# Patient Record
Sex: Female | Born: 1940 | Race: White | Hispanic: No | Smoking: Former smoker
Health system: Southern US, Community
[De-identification: ages and names within clinical notes are randomized; demographics above are authoritative.]

## PROBLEM LIST (undated history)

## (undated) DIAGNOSIS — F0390 Unspecified dementia without behavioral disturbance: Secondary | ICD-10-CM

## (undated) DIAGNOSIS — K5790 Diverticulosis of intestine, part unspecified, without perforation or abscess without bleeding: Secondary | ICD-10-CM

## (undated) DIAGNOSIS — G479 Sleep disorder, unspecified: Secondary | ICD-10-CM

## (undated) DIAGNOSIS — I5030 Unspecified diastolic (congestive) heart failure: Secondary | ICD-10-CM

## (undated) DIAGNOSIS — H349 Unspecified retinal vascular occlusion: Secondary | ICD-10-CM

## (undated) DIAGNOSIS — F329 Major depressive disorder, single episode, unspecified: Secondary | ICD-10-CM

## (undated) DIAGNOSIS — Z87442 Personal history of urinary calculi: Secondary | ICD-10-CM

## (undated) DIAGNOSIS — Q211 Atrial septal defect: Secondary | ICD-10-CM

## (undated) DIAGNOSIS — I700 Atherosclerosis of aorta: Secondary | ICD-10-CM

## (undated) DIAGNOSIS — R296 Repeated falls: Secondary | ICD-10-CM

## (undated) DIAGNOSIS — K8010 Calculus of gallbladder with chronic cholecystitis without obstruction: Secondary | ICD-10-CM

## (undated) DIAGNOSIS — M47816 Spondylosis without myelopathy or radiculopathy, lumbar region: Secondary | ICD-10-CM

## (undated) DIAGNOSIS — R0609 Other forms of dyspnea: Secondary | ICD-10-CM

## (undated) DIAGNOSIS — E785 Hyperlipidemia, unspecified: Secondary | ICD-10-CM

## (undated) DIAGNOSIS — I509 Heart failure, unspecified: Secondary | ICD-10-CM

## (undated) DIAGNOSIS — I10 Essential (primary) hypertension: Secondary | ICD-10-CM

## (undated) DIAGNOSIS — N183 Chronic kidney disease, stage 3 unspecified: Secondary | ICD-10-CM

## (undated) DIAGNOSIS — K219 Gastro-esophageal reflux disease without esophagitis: Secondary | ICD-10-CM

## (undated) DIAGNOSIS — F1010 Alcohol abuse, uncomplicated: Secondary | ICD-10-CM

## (undated) DIAGNOSIS — D649 Anemia, unspecified: Secondary | ICD-10-CM

## (undated) DIAGNOSIS — F1024 Alcohol dependence with alcohol-induced mood disorder: Secondary | ICD-10-CM

## (undated) DIAGNOSIS — K449 Diaphragmatic hernia without obstruction or gangrene: Secondary | ICD-10-CM

## (undated) DIAGNOSIS — M1990 Unspecified osteoarthritis, unspecified site: Secondary | ICD-10-CM

## (undated) DIAGNOSIS — R531 Weakness: Secondary | ICD-10-CM

## (undated) DIAGNOSIS — I639 Cerebral infarction, unspecified: Secondary | ICD-10-CM

## (undated) DIAGNOSIS — K760 Fatty (change of) liver, not elsewhere classified: Secondary | ICD-10-CM

## (undated) DIAGNOSIS — R413 Other amnesia: Secondary | ICD-10-CM

## (undated) DIAGNOSIS — J449 Chronic obstructive pulmonary disease, unspecified: Secondary | ICD-10-CM

## (undated) DIAGNOSIS — J189 Pneumonia, unspecified organism: Secondary | ICD-10-CM

## (undated) DIAGNOSIS — Q2112 Patent foramen ovale: Secondary | ICD-10-CM

## (undated) HISTORY — DX: Repeated falls: R29.6

## (undated) HISTORY — DX: Weakness: R53.1

## (undated) HISTORY — DX: Patent foramen ovale: Q21.12

## (undated) HISTORY — DX: Hyperlipidemia, unspecified: E78.5

## (undated) HISTORY — PX: APPENDECTOMY: SHX54

## (undated) HISTORY — DX: Unspecified retinal vascular occlusion: H34.9

## (undated) HISTORY — PX: CHOLECYSTECTOMY: SHX55

## (undated) HISTORY — DX: Atrial septal defect: Q21.1

## (undated) HISTORY — PX: FRACTURE SURGERY: SHX138

## (undated) HISTORY — DX: Heart failure, unspecified: I50.9

## (undated) HISTORY — PX: TUBAL LIGATION: SHX77

## (undated) HISTORY — DX: Essential (primary) hypertension: I10

## (undated) HISTORY — DX: Gastro-esophageal reflux disease without esophagitis: K21.9

## (undated) HISTORY — PX: ANKLE ARTHROSCOPY WITH OPEN REDUCTION INTERNAL FIXATION (ORIF): SHX5582

## (undated) HISTORY — DX: Other amnesia: R41.3

## (undated) SURGERY — PULMONARY THROMBECTOMY
Anesthesia: Moderate Sedation | Laterality: Right

---

## 2007-05-08 ENCOUNTER — Ambulatory Visit: Payer: Self-pay | Admitting: Vascular Surgery

## 2007-05-24 ENCOUNTER — Encounter (INDEPENDENT_AMBULATORY_CARE_PROVIDER_SITE_OTHER): Payer: Self-pay | Admitting: *Deleted

## 2007-05-24 ENCOUNTER — Ambulatory Visit (HOSPITAL_COMMUNITY): Admission: RE | Admit: 2007-05-24 | Discharge: 2007-05-24 | Payer: Self-pay | Admitting: *Deleted

## 2007-11-05 LAB — TSH: TSH: 1.89 u[IU]/mL (ref <=5.90)

## 2009-04-15 ENCOUNTER — Ambulatory Visit: Payer: Self-pay | Admitting: Vascular Surgery

## 2009-08-24 LAB — BASIC METABOLIC PANEL: BUN: 26 mg/dL — AB (ref 4–21)

## 2010-04-08 ENCOUNTER — Emergency Department: Payer: Self-pay | Admitting: Emergency Medicine

## 2010-04-16 ENCOUNTER — Ambulatory Visit: Payer: Self-pay | Admitting: Cardiovascular Disease

## 2010-09-18 ENCOUNTER — Encounter: Payer: Self-pay | Admitting: Cardiovascular Disease

## 2010-09-18 DIAGNOSIS — E785 Hyperlipidemia, unspecified: Secondary | ICD-10-CM | POA: Insufficient documentation

## 2010-09-18 DIAGNOSIS — H349 Unspecified retinal vascular occlusion: Secondary | ICD-10-CM | POA: Insufficient documentation

## 2010-09-18 DIAGNOSIS — Q2112 Patent foramen ovale: Secondary | ICD-10-CM | POA: Insufficient documentation

## 2010-09-18 DIAGNOSIS — Q211 Atrial septal defect: Secondary | ICD-10-CM | POA: Insufficient documentation

## 2010-09-18 DIAGNOSIS — I10 Essential (primary) hypertension: Secondary | ICD-10-CM | POA: Insufficient documentation

## 2010-09-18 DIAGNOSIS — K219 Gastro-esophageal reflux disease without esophagitis: Secondary | ICD-10-CM | POA: Insufficient documentation

## 2010-11-09 ENCOUNTER — Ambulatory Visit: Payer: Self-pay | Admitting: Cardiovascular Disease

## 2010-11-25 ENCOUNTER — Ambulatory Visit: Payer: Self-pay | Admitting: Cardiovascular Disease

## 2010-12-13 ENCOUNTER — Other Ambulatory Visit: Payer: Self-pay | Admitting: *Deleted

## 2010-12-13 DIAGNOSIS — E7800 Pure hypercholesterolemia, unspecified: Secondary | ICD-10-CM

## 2010-12-28 ENCOUNTER — Ambulatory Visit (INDEPENDENT_AMBULATORY_CARE_PROVIDER_SITE_OTHER): Payer: Medicare Other | Admitting: Cardiovascular Disease

## 2010-12-28 ENCOUNTER — Encounter: Payer: Self-pay | Admitting: Cardiovascular Disease

## 2010-12-28 ENCOUNTER — Other Ambulatory Visit (INDEPENDENT_AMBULATORY_CARE_PROVIDER_SITE_OTHER): Payer: Medicare Other | Admitting: *Deleted

## 2010-12-28 VITALS — BP 158/102 | HR 62 | Ht 67.0 in | Wt 216.6 lb

## 2010-12-28 DIAGNOSIS — I10 Essential (primary) hypertension: Secondary | ICD-10-CM

## 2010-12-28 DIAGNOSIS — R9431 Abnormal electrocardiogram [ECG] [EKG]: Secondary | ICD-10-CM

## 2010-12-28 DIAGNOSIS — E7800 Pure hypercholesterolemia, unspecified: Secondary | ICD-10-CM

## 2010-12-28 LAB — LIPID PANEL
Cholesterol: 172 mg/dL (ref 0–200)
HDL: 55 mg/dL (ref >39.00)
LDL Cholesterol: 93 mg/dL (ref 0–99)
Total CHOL/HDL Ratio: 3
Triglycerides: 121 mg/dL (ref 0.0–149.0)
VLDL: 24.2 mg/dL (ref 0.0–40.0)

## 2010-12-28 LAB — BASIC METABOLIC PANEL
BUN: 21 mg/dL (ref 6–23)
CO2: 30 mEq/L (ref 19–32)
Calcium: 9.7 mg/dL (ref 8.4–10.5)
Chloride: 104 mEq/L (ref 96–112)
Creatinine, Ser: 0.8 mg/dL (ref 0.4–1.2)
GFR: 77.72 mL/min (ref >60.00)
Glucose, Bld: 87 mg/dL (ref 70–99)
Potassium: 4.1 mEq/L (ref 3.5–5.1)
Sodium: 141 mEq/L (ref 135–145)

## 2010-12-28 LAB — HEPATIC FUNCTION PANEL
ALT: 44 U/L — ABNORMAL HIGH (ref 0–35)
AST: 29 U/L (ref 0–37)
Albumin: 4.1 g/dL (ref 3.5–5.2)
Alkaline Phosphatase: 41 U/L (ref 39–117)
Bilirubin, Direct: 0.1 mg/dL (ref 0.0–0.3)
Total Bilirubin: 0.5 mg/dL (ref 0.3–1.2)
Total Protein: 7 g/dL (ref 6.0–8.3)

## 2010-12-28 NOTE — Assessment & Plan Note (Signed)
OFFICE VISIT   ZO, LOUDON I  DOB:  [birth year]                                       04/15/2009  JXBJY#:[id number]   The patient is a 70 year old female referred for evaluation of carotid  disease.  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  However, since that time she denies any  symptoms of TIA, amaurosis or stroke.  She had a carotid duplex exam in  2008 which showed no significant stenosis.  Her atherosclerotic risk  factors include hypertension, elevated cholesterol and remote tobacco  history.  She quit smoking 20-30 years ago.  She has no history of  coronary artery disease.   PAST SURGICAL HISTORY:  Remarkable for appendectomy and bilateral tubal  ligation.   PAST MEDICAL HISTORY:  Otherwise unremarkable.   MEDICATIONS:  Aleve p.r.n., aspirin 81 mg once a day, calcium 630 mg  twice a day, fish oil 2000 mg twice a day, lisinopril 40 mg once a day,  melatonin 3 mg in the evening, pravastatin 40 mg in the evening,  Prilosec 2 mg in the morning, multivitamin once a day, verapamil HCl 240  mg once in the morning, vitamin B12 1000 mcg in the morning, vitamin D  1000 international units in the morning, vitamin E 800 mg twice a day.   ALLERGIES:  She has no known drug allergies.   FAMILY HISTORY:  Unremarkable.   SOCIAL HISTORY:  She is married.  She is retired.  She is a nonsmoker.  She drinks one glass of wine daily.   REVIEW OF SYSTEMS:  She is 5 feet 6, 210 pounds.  ORTHOPEDIC:  She has multiple joint arthritis.  CARDIAC:  PULMONARY:  GI:  RENAL:  VASCULAR:  NEUROLOGIC:  PSYCHIATRIC:  ENT:  HEMATOLOGIC:  Review of systems are all negative.   PHYSICAL EXAM:  Vital signs:  Blood pressure is 169/87 in the left arm,  165/86 the right arm, pulse 70 and regular.  HEENT is unremarkable.  Neck:  Has 2+ carotid pulses without  bruit.  Chest:  Clear to  auscultation except a few crackles in bases bilaterally.  Cardiac:  Regular rate rhythm without murmur.  Abdomen:  Obese, soft, nontender,  nondistended.  No masses.  Extremities:  She has 2+ radial, femoral,  popliteal and dorsalis pedis pulses bilaterally.  She has no edema.  Neurologic:  Exam shows symmetric upper extremity and lower extremity  motor strength which is 5/5 and symmetric bilaterally.  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   She had a repeat carotid duplex exam today which showed less than 40%  stenosis bilateral carotids and no significant change in velocities or  appearance of carotid arteries since September 2008.   Overall, the patient seems to be asymptomatic from her carotid  standpoint.  She has no flow-limiting stenosis.  Although she does seem  to have had some atheroembolic event in 2008, she has not had an episode  since then.  The best option is continued risk factor modification of  her hypertension, elevated cholesterol as Dr. Wylene Simmer is currently  doing.  Also her antiplatelet agent, aspirin should be sufficient.  I  discussed the findings of a carotid duplex scan today with the patient.  I believe she probably have a repeat carotid duplex exam again in about  5 years.  She will schedule this at her convenience.  She will follow up  with me otherwise on an as-needed basis.   Janetta Hora. Fields, MD  Electronically Signed   CEF/MEDQ  D:  04/15/2009  T:  04/16/2009  Job:  [id number]   cc:   Gaspar Garbe, M.D.  [facility] Dr. Stephannie Li

## 2010-12-28 NOTE — Procedures (Signed)
CAROTID DUPLEX EXAM   INDICATION:  Retinal artery occlusion.   HISTORY:  Diabetes:  No  Cardiac:  No  Hypertension:  Yes  Smoking:  Previous  Previous Surgery:  No  CV History:  Right eye blindness since 2008  Amaurosis Fugax No, Paresthesias No, Hemiparesis No                                       RIGHT             LEFT  Brachial systolic pressure:         157               154  Brachial Doppler waveforms:         Normal            Normal  Vertebral direction of flow:        Antegrade         Antegrade  DUPLEX VELOCITIES (cm/sec)  CCA peak systolic                   74                63  ECA peak systolic                   219               47  ICA peak systolic                   54                78  ICA end diastolic                   16                20  PLAQUE MORPHOLOGY:                  Heterogeneous     Heterogenous  PLAQUE AMOUNT:                      Mild              Mild  PLAQUE LOCATION:                    ICA/ECA           ICA/ECA   IMPRESSION:  1. 1% to 39% stenosis of the bilateral internal carotid arteries.  2. No significant change in Doppler velocities when compared to the      previous exam on May 08, 2007.   ___________________________________________  Janetta Hora. Fields, MD   CH/MEDQ  D:  04/15/2009  T:  04/16/2009  Job:  [id number]

## 2010-12-28 NOTE — Progress Notes (Signed)
Pt called and given normal results.

## 2010-12-28 NOTE — Assessment & Plan Note (Signed)
Her blood pressure is elevated today but she has not taken her blood pressure medicines. She checks her blood pressure at home and the readings are typically normal.  We will have her continue her same medications and I'll see her in 6 months.

## 2010-12-28 NOTE — Progress Notes (Signed)
Virginia Walters Date of Birth  [birth year] [facility] [address]     [address] [place], [place]  [address] [phone]  Fax  [phone]  History of Present Illness:  Not having any problems with cp, dyspnea or dizziness.  Patient has been exercising on regular basis. She's been working out in her garden this summer.  Current Outpatient Prescriptions on File Prior to Visit  Medication Sig Dispense Refill  . aspirin 81 MG tablet Take 81 mg by mouth daily.        . B Complex-Biotin-FA (B-COMPLEX PO) Take by mouth daily.        . Calcium Carbonate-Vitamin D (CALCIUM + D PO) Take by mouth 2 (two) times daily.        . Cholecalciferol (VITAMIN D) 2000 UNITS CAPS Take by mouth daily.        . dorzolamide (TRUSOPT) 2 % ophthalmic solution 1 drop 2 (two) times daily.        . fish oil-omega-3 fatty acids 1000 MG capsule Take 2 g by mouth 2 (two) times daily.        . hydrochlorothiazide 25 MG tablet Take 25 mg by mouth daily.        Marland Kitchen lisinopril (PRINIVIL,ZESTRIL) 40 MG tablet Take 40 mg by mouth daily.        . Melatonin 3 MG CAPS Take by mouth at bedtime.        . Multiple Vitamin (MULTIVITAMIN) tablet Take 1 tablet by mouth daily.        . Naproxen Sodium (ALEVE PO) Take by mouth as needed.        Marland Kitchen omeprazole (PRILOSEC) 20 MG capsule Take 20 mg by mouth daily.        . potassium chloride (KLOR-CON) 10 MEQ CR tablet Take 10 mEq by mouth daily.        . pravastatin (PRAVACHOL) 40 MG tablet Take 40 mg by mouth daily.        . verapamil (COVERA HS) 240 MG (CO) 24 hr tablet Take 240 mg by mouth at bedtime.        Marland Kitchen VITAMIN E PO Take by mouth 2 (two) times daily.          No Known Allergies  Past Medical History  Diagnosis Date  . HTN (hypertension)   . Dyslipidemia   . PFO (patent foramen ovale)   . Retinal artery occlusion   . GERD (gastroesophageal reflux disease)     Past Surgical History  Procedure Date  . Appendectomy   . Tubal  ligation     History  Smoking status  . Former Smoker  . Quit date: 12/27/1980  Smokeless tobacco  . Not on file    History  Alcohol Use  . Yes    occassionally    Family History  Problem Relation Age of Onset  . Esophageal cancer Father   . Dementia Mother     Reviw of Systems:  Reviewed in the HPI.  All other systems are negative.  Physical Exam: BP 158/102  Pulse 62  Ht 5\' 7"  (1.702 m)  Wt 216 lb 9.6 oz (98.249 kg)  BMI 33.92 kg/m2 The patient is alert and oriented x 3.  The mood and affect are normal.  The skin is warm and dry.  Color is normal.  The HEENT exam reveals that the sclera are nonicteric.  The mucous membranes are moist.  The carotids are 2+ without  bruits.  There is no thyromegaly.  There is no JVD.  The lungs are clear.  The chest wall is non tender.  The heart exam reveals a regular rate with a normal S1 and S2.  There are no murmurs, gallops, or rubs.  The PMI is not displaced.   Abdominal exam reveals good bowel sounds.  There is no guarding or rebound.  There is no hepatosplenomegaly or tenderness.  There are no masses.  Exam of the legs reveal no clubbing, cyanosis, or edema.  The legs are without rashes.  The distal pulses are intact.  Cranial nerves II - XII are intact.  Motor and sensory functions are intact.  The gait is normal.  ECG: Normal sinus rhythm. She has no ST or T wave changes.  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

## 2010-12-28 NOTE — Procedures (Signed)
CAROTID DUPLEX EXAM   INDICATION:  Right retinal artery occlusion.   HISTORY:  Diabetes:  No.  Cardiac:  No.  Hypertension:  Yes.  Smoking:  Quit in '88.  Previous Surgery:  No.  CV History:  The patient had a total loss of vision in her right eye 5  days ago.  Amaurosis Fugax Yes, Paresthesias No, Hemiparesis No.                                       RIGHT             LEFT  Brachial systolic pressure:         150.              144.  Brachial Doppler waveforms:         Triphasic.        Triphasic.  Vertebral direction of flow:        Antigrade.        Antigrade.  DUPLEX VELOCITIES (cm/sec)  CCA peak systolic                   57.               77.  ECA peak systolic                   44.               48.  ICA peak systolic                   43.               57.  ICA end diastolic                   13.               15.  PLAQUE MORPHOLOGY:                  Soft.             None.  PLAQUE AMOUNT:                      Mild.             None.  PLAQUE LOCATION:                    Proximal ICA.     None.   IMPRESSION:  1. 20-39% right internal carotid artery stenosis.  2. No left internal carotid artery stenosis.   ___________________________________________  Larina Earthly, M.D.   MC/MEDQ  D:  05/08/2007  T:  05/09/2007  Job:  [id number]

## 2010-12-28 NOTE — Assessment & Plan Note (Signed)
Her EKG from today reveals Q waves in leads aVF and lead 3. Her last EKG in 2008 did not reveal any similar changes.  Several of her limb leads have been some acknowledges that and I suspect that this is due to limb lead reversal. She's not had any symptoms of myocardial infarction. We will check an EKG on her next time.

## 2011-02-07 ENCOUNTER — Encounter: Payer: Self-pay | Admitting: Cardiovascular Disease

## 2011-06-29 ENCOUNTER — Other Ambulatory Visit: Payer: Self-pay | Admitting: *Deleted

## 2011-06-29 MED ORDER — HYDROCHLOROTHIAZIDE 25 MG PO TABS: 25.0000 mg | ORAL_TABLET | Freq: Every day | ORAL | Status: DC

## 2011-08-01 ENCOUNTER — Other Ambulatory Visit: Payer: Self-pay | Admitting: *Deleted

## 2011-08-01 MED ORDER — POTASSIUM CHLORIDE 10 MEQ PO TBCR: 10.0000 meq | EXTENDED_RELEASE_TABLET | Freq: Every day | ORAL | Status: DC

## 2012-02-01 ENCOUNTER — Other Ambulatory Visit: Payer: Self-pay | Admitting: *Deleted

## 2012-02-01 MED ORDER — HYDROCHLOROTHIAZIDE 25 MG PO TABS: 25.0000 mg | ORAL_TABLET | Freq: Every day | ORAL | Status: DC

## 2012-02-01 NOTE — Telephone Encounter (Signed)
Pt needs appointment then refill can be made Fax Received. Refill Completed. Virginia Walters (R.M.A)   

## 2012-02-02 ENCOUNTER — Other Ambulatory Visit: Payer: Self-pay | Admitting: Cardiovascular Disease

## 2012-02-02 MED ORDER — HYDROCHLOROTHIAZIDE 25 MG PO TABS: 25.0000 mg | ORAL_TABLET | Freq: Every day | ORAL | Status: DC

## 2012-03-05 ENCOUNTER — Other Ambulatory Visit: Payer: Self-pay | Admitting: *Deleted

## 2012-03-05 MED ORDER — HYDROCHLOROTHIAZIDE 25 MG PO TABS: 25.0000 mg | ORAL_TABLET | Freq: Every day | ORAL | Status: DC

## 2012-03-05 NOTE — Telephone Encounter (Signed)
Refilled hctz one time only

## 2012-05-09 ENCOUNTER — Other Ambulatory Visit: Payer: Self-pay | Admitting: *Deleted

## 2012-05-09 MED ORDER — HYDROCHLOROTHIAZIDE 25 MG PO TABS: 25.0000 mg | ORAL_TABLET | Freq: Every day | ORAL | Status: DC

## 2012-05-09 NOTE — Telephone Encounter (Signed)
Pt needs appointment then refill can be made Fax Received. Refill Completed. Neria Procter Chowoe (R.M.A)   

## 2012-05-16 ENCOUNTER — Other Ambulatory Visit: Payer: Self-pay | Admitting: *Deleted

## 2012-05-16 MED ORDER — POTASSIUM CHLORIDE ER 10 MEQ PO TBCR: 10.0000 meq | EXTENDED_RELEASE_TABLET | Freq: Every day | ORAL | Status: DC

## 2012-05-16 NOTE — Telephone Encounter (Signed)
Pt needs appointment then refill can be made Fax Received. Refill Completed. Aundray Cartlidge Chowoe (R.M.A)   

## 2012-05-16 NOTE — Telephone Encounter (Signed)
Pt needs appointment then refill can be made Fax Received. Refill Completed. Virginia Walters (R.M.A)   

## 2012-06-08 ENCOUNTER — Other Ambulatory Visit: Payer: Self-pay | Admitting: *Deleted

## 2012-06-08 MED ORDER — HYDROCHLOROTHIAZIDE 25 MG PO TABS: 25.0000 mg | ORAL_TABLET | Freq: Every day | ORAL | Status: DC

## 2012-07-11 ENCOUNTER — Other Ambulatory Visit: Payer: Self-pay | Admitting: Cardiovascular Disease

## 2012-08-06 ENCOUNTER — Other Ambulatory Visit: Payer: Self-pay | Admitting: Cardiovascular Disease

## 2012-08-06 MED ORDER — HYDROCHLOROTHIAZIDE 25 MG PO TABS: 25.0000 mg | ORAL_TABLET | Freq: Every day | ORAL | Status: DC

## 2012-08-17 ENCOUNTER — Other Ambulatory Visit: Payer: Self-pay | Admitting: Cardiovascular Disease

## 2012-08-23 ENCOUNTER — Ambulatory Visit (INDEPENDENT_AMBULATORY_CARE_PROVIDER_SITE_OTHER): Payer: Medicare Other | Admitting: Physician Assistant

## 2012-08-23 ENCOUNTER — Encounter: Payer: Self-pay | Admitting: Physician Assistant

## 2012-08-23 VITALS — BP 128/80 | HR 74 | Ht 68.0 in | Wt 214.0 lb

## 2012-08-23 DIAGNOSIS — I10 Essential (primary) hypertension: Secondary | ICD-10-CM

## 2012-08-23 MED ORDER — HYDROCHLOROTHIAZIDE 25 MG PO TABS: 25.0000 mg | ORAL_TABLET | Freq: Every day | ORAL | Status: DC

## 2012-08-23 NOTE — Patient Instructions (Addendum)
Your physician wants you to follow-up in: 6 MONTHS WITH DR. NAHSER. You will receive a reminder letter in the mail two months in advance. If you don't receive a letter, please call our office to schedule the follow-up appointment.   NO CHANGES WERE MADE TODAY 

## 2012-08-23 NOTE — Progress Notes (Signed)
[address] [place], [place]  [address] Phone: [phone], Fax:  [phone]  Date:  08/23/2012   Name:  CHERY GIUSTO   DOB:  [birth year]   MRN:  [id number]  PCP:  Gaspar Garbe, MD  Primary Cardiologist:  Dr. Delane Ginger  Primary Electrophysiologist:  None    History of Present Illness: Virginia Walters is a 72 y.o. female who returns for follow up on BP.   She has a history of HTN, HL, PFO, prior retinal artery occlusion. Transesophageal echo 10/08: EF 55-60%, trivial AI, very small right-to-left shunt, very small PFO. Last seen by Dr. Delane Ginger 5/12. Since that time, she has done well. She denies chest pain, shortness of breath, syncope, orthopnea, PND or edema. She is under quite a lot of stress with her husband who now has dementia.  Labs (5/12):   K 4.1, creatinine 0.8, ALT 44, LDL 93   Wt Readings from Last 3 Encounters:  08/23/12 214 lb (97.07 kg)  12/28/10 216 lb 9.6 oz (98.249 kg)     Past Medical History  Diagnosis Date  . HTN (hypertension)   . Dyslipidemia   . PFO (patent foramen ovale)   . Retinal artery occlusion   . GERD (gastroesophageal reflux disease)     Current Outpatient Prescriptions  Medication Sig Dispense Refill  . aspirin 81 MG tablet Take 81 mg by mouth daily.        . B Complex-Biotin-FA (B-COMPLEX PO) Take by mouth daily.        . Calcium Carbonate-Vitamin D (CALCIUM + D PO) Take by mouth 2 (two) times daily.        . Cholecalciferol (VITAMIN D) 2000 UNITS CAPS Take by mouth daily.        . clonazePAM (KLONOPIN) 0.5 MG tablet Take 0.5 mg by mouth 2 (two) times daily as needed.      . dorzolamide (TRUSOPT) 2 % ophthalmic solution 1 drop 2 (two) times daily.        . fish oil-omega-3 fatty acids 1000 MG capsule Take 2 g by mouth 2 (two) times daily.        Marland Kitchen FLUoxetine (PROZAC) 20 MG capsule Take 20 mg by mouth daily.      . hydrochlorothiazide (HYDRODIURIL) 25 MG tablet Take 1 tablet (25 mg total) by  mouth daily.  30 tablet  11  . lisinopril (PRINIVIL,ZESTRIL) 40 MG tablet Take 40 mg by mouth daily.        . Melatonin 3 MG CAPS Take by mouth at bedtime.        . Multiple Vitamin (MULTIVITAMIN) tablet Take 1 tablet by mouth daily.        . Naproxen Sodium (ALEVE PO) Take by mouth as needed.        Marland Kitchen omeprazole (PRILOSEC) 20 MG capsule Take 20 mg by mouth daily.        . potassium chloride (KLOR-CON 10) 10 MEQ tablet Take 1 tablet (10 mEq total) by mouth daily.  30 tablet  1  . pravastatin (PRAVACHOL) 40 MG tablet Take 40 mg by mouth daily.        . verapamil (COVERA HS) 240 MG (CO) 24 hr tablet Take 240 mg by mouth at bedtime.        Marland Kitchen VITAMIN E PO Take by mouth 2 (two) times daily.          Allergies:  No Known Allergies  Social History:  The patient  reports that she quit smoking about 31 years ago. She does not have any smokeless tobacco history on file. She reports that she drinks alcohol. She reports that she does not use illicit drugs.   ROS:  Please see the history of present illness.   All other systems reviewed and negative.   PHYSICAL EXAM: VS:  BP 128/80  Pulse 74  Ht 5\' 8"  (1.727 m)  Wt 214 lb (97.07 kg)  BMI 32.54 kg/m2 Well nourished, well developed, in no acute distress HEENT: normal Neck: no JVD Vascular: No carotid bruits Cardiac:  normal S1, S2; RRR; no murmur Lungs:  clear to auscultation bilaterally, no wheezing, rhonchi or rales Abd: soft, nontender, no hepatomegaly Ext: no edema Skin: warm and dry Neuro:  CNs 2-12 intact, no focal abnormalities noted  EKG:  NSR, HR 74, nonspecific ST-T wave changes, no significant change since prior tracing     ASSESSMENT AND PLAN:  1. Hypertension:   Controlled.  Continue current therapy.  Medications have been refilled today.  2. Hyperlipidemia:   Continue statin.  3. Patent Foramen Ovale:  This is very small by TEE in the past. She denies any recent history of TIA or stroke.  Continue aspirin.  4. Disposition:    Follow up with Dr. Delane Ginger in 6 mos.  Signed, Tereso Newcomer, PA-C  12:50 PM 08/23/2012

## 2012-09-07 ENCOUNTER — Other Ambulatory Visit: Payer: Self-pay | Admitting: *Deleted

## 2012-09-07 MED ORDER — POTASSIUM CHLORIDE ER 10 MEQ PO TBCR: 10.0000 meq | EXTENDED_RELEASE_TABLET | Freq: Every day | ORAL | Status: DC

## 2012-09-07 NOTE — Telephone Encounter (Signed)
Fax Received. Refill Completed. Oddis Westling Chowoe (R.M.A)   

## 2013-04-08 ENCOUNTER — Other Ambulatory Visit: Payer: Self-pay | Admitting: *Deleted

## 2013-04-08 MED ORDER — POTASSIUM CHLORIDE ER 10 MEQ PO TBCR: 10.0000 meq | EXTENDED_RELEASE_TABLET | Freq: Every day | ORAL | Status: DC

## 2013-04-08 NOTE — Telephone Encounter (Signed)
Fax Received. Refill Completed. Virginia Walters (R.M.A)   

## 2013-07-22 ENCOUNTER — Ambulatory Visit: Payer: Self-pay | Admitting: Orthopedic Surgery

## 2013-07-22 LAB — POTASSIUM: Potassium: 4.2 mmol/L (ref 3.5–5.1)

## 2013-07-25 ENCOUNTER — Observation Stay: Payer: Self-pay | Admitting: Orthopedic Surgery

## 2013-09-10 ENCOUNTER — Other Ambulatory Visit: Payer: Self-pay

## 2013-09-10 DIAGNOSIS — I10 Essential (primary) hypertension: Secondary | ICD-10-CM

## 2013-09-10 MED ORDER — HYDROCHLOROTHIAZIDE 25 MG PO TABS: 25.0000 mg | ORAL_TABLET | Freq: Every day | ORAL | Status: DC

## 2013-10-16 ENCOUNTER — Other Ambulatory Visit: Payer: Self-pay

## 2013-10-16 DIAGNOSIS — I10 Essential (primary) hypertension: Secondary | ICD-10-CM

## 2013-10-16 MED ORDER — HYDROCHLOROTHIAZIDE 25 MG PO TABS: 25.0000 mg | ORAL_TABLET | Freq: Every day | ORAL | Status: DC

## 2013-11-15 ENCOUNTER — Ambulatory Visit (INDEPENDENT_AMBULATORY_CARE_PROVIDER_SITE_OTHER): Payer: Medicare Other | Admitting: Cardiovascular Disease

## 2013-11-15 ENCOUNTER — Encounter: Payer: Self-pay | Admitting: Cardiovascular Disease

## 2013-11-15 VITALS — BP 157/89 | HR 78 | Ht 67.0 in | Wt 210.8 lb

## 2013-11-15 DIAGNOSIS — Q2112 Patent foramen ovale: Secondary | ICD-10-CM

## 2013-11-15 DIAGNOSIS — Q2111 Secundum atrial septal defect: Secondary | ICD-10-CM

## 2013-11-15 DIAGNOSIS — I10 Essential (primary) hypertension: Secondary | ICD-10-CM

## 2013-11-15 DIAGNOSIS — Q211 Atrial septal defect: Secondary | ICD-10-CM

## 2013-11-15 DIAGNOSIS — R9431 Abnormal electrocardiogram [ECG] [EKG]: Secondary | ICD-10-CM

## 2013-11-15 NOTE — Progress Notes (Signed)
[address] [place], [place]  [address] Phone: [phone], Fax:  [phone]  Date:  11/15/2013   Name:  Virginia Walters   DOB:  [birth year]   MRN:  [id number]  PCP:  Haywood Pao, MD  Primary Cardiologist:  Dr. Liam Rogers  Primary Electrophysiologist:  None    Problem list: 1. Hypertension 2. Hyperlipidemia 3. Patent foramen ovale 4. History of retinal artery occlusion  History of Present Illness: Virginia Walters is a 73 y.o. female who returns for follow up on BP.   She has a history of HTN, HL, PFO, prior retinal artery occlusion. Transesophageal echo 10/08: EF 55-60%, trivial AI, very small right-to-left shunt, very small PFO. Last seen by Dr. Liam Rogers 5/12. Since that time, she has done well. She denies chest pain, shortness of breath, syncope, orthopnea, PND or edema. She is under quite a lot of stress with her husband who now has dementia.  Labs (5/12):   K 4.1, creatinine 0.8, ALT 44, LDL 93  November 15, 2013:  She is doing ok from a cardiac standpoint.  Her husband passed away 1 year ago.  She ran into the door and has a left black eye.  She has not been to the gym recently.   Her blood pressure at home is typically normal. Her blood pressure today is a little high because she rushed out of the house and did not take her medications this morning.   Wt Readings from Last 3 Encounters:  11/15/13 210 lb 12.8 oz (95.618 kg)  08/23/12 214 lb (97.07 kg)  12/28/10 216 lb 9.6 oz (98.249 kg)     Past Medical History  Diagnosis Date  . HTN (hypertension)   . Dyslipidemia   . PFO (patent foramen ovale)   . Retinal artery occlusion   . GERD (gastroesophageal reflux disease)     Current Outpatient Prescriptions  Medication Sig Dispense Refill  . aspirin 81 MG tablet Take 81 mg by mouth daily.        . B Complex-Biotin-FA (B-COMPLEX PO) Take 1 capsule by mouth daily.       . Calcium Carbonate-Vitamin D (CALCIUM + D PO) Take 1  capsule by mouth 2 (two) times daily.       . Cholecalciferol (VITAMIN D) 2000 UNITS CAPS Take 1 capsule by mouth daily.       . clonazePAM (KLONOPIN) 0.5 MG tablet Take 0.5 mg by mouth 2 (two) times daily as needed.      . dorzolamide (TRUSOPT) 2 % ophthalmic solution 1 drop 2 (two) times daily.        . fish oil-omega-3 fatty acids 1000 MG capsule Take 2 g by mouth 2 (two) times daily.        Marland Kitchen FLUoxetine (PROZAC) 20 MG capsule Take 20 mg by mouth daily.      . hydrochlorothiazide (HYDRODIURIL) 25 MG tablet Take 1 tablet (25 mg total) by mouth daily.  30 tablet  1  . lisinopril (PRINIVIL,ZESTRIL) 40 MG tablet Take 40 mg by mouth daily.        . Multiple Vitamin (MULTIVITAMIN) tablet Take 1 tablet by mouth daily.        . Naproxen Sodium (ALEVE PO) Take 1 capsule by mouth as needed.       Marland Kitchen omeprazole (PRILOSEC) 20 MG capsule Take 20 mg by mouth daily.        . potassium chloride (  KLOR-CON 10) 10 MEQ tablet Take 1 tablet (10 mEq total) by mouth daily.  30 tablet  6  . pravastatin (PRAVACHOL) 40 MG tablet Take 40 mg by mouth daily.        . verapamil (COVERA HS) 240 MG (CO) 24 hr tablet Take 240 mg by mouth at bedtime.        Marland Kitchen VITAMIN E PO Take 1 capsule by mouth 2 (two) times daily.        No current facility-administered medications for this visit.    Allergies:  No Known Allergies  Social History:  The patient  reports that she quit smoking about 32 years ago. She does not have any smokeless tobacco history on file. She reports that she drinks alcohol. She reports that she does not use illicit drugs.   ROS:  Please see the history of present illness.   All other systems reviewed and negative.   PHYSICAL EXAM: VS:  BP 157/89  Pulse 78  Ht 5\' 7"  (1.702 m)  Wt 210 lb 12.8 oz (95.618 kg)  BMI 33.01 kg/m2 Well nourished, well developed, in no acute distress HEENT: black eye on left side.  Neck: no JVD Vascular: No carotid bruits Cardiac:  normal S1, S2; RRR; no murmur Lungs:  clear  to auscultation bilaterally, no wheezing, rhonchi or rales Abd: soft, nontender, no hepatomegaly Ext: no edema Skin: warm and dry Neuro:  CNs 2-12 intact, no focal abnormalities noted  EKG:  November 15, 2013:  NSR 78, small inf. Q wave    ASSESSMENT AND PLAN:

## 2013-11-15 NOTE — Assessment & Plan Note (Signed)
She's been followed by [place] for a very small patent foramen ovale. At this point, she's not having any further troubles and does not need to be followed on a regular basis.  I would  be happy to see if she has any additional problems. Continue aspirin 81 mg a day.

## 2013-11-15 NOTE — Assessment & Plan Note (Signed)
Virginia Walters is doing well. She's not having any cardiac issues. Her blood pressure is a little bit high today because she has not taken her medications yet.  We will have her primary medical doctor start refill in all of her antihypertensives. I'll see her as needed.

## 2013-11-15 NOTE — Patient Instructions (Signed)
Your physician recommends that you schedule a follow-up appointment in: as needed basis   Your physician recommends that you continue on your current medications as directed. Please refer to the Current Medication list given to you today.   

## 2013-12-11 ENCOUNTER — Other Ambulatory Visit: Payer: Self-pay

## 2013-12-11 MED ORDER — POTASSIUM CHLORIDE ER 10 MEQ PO TBCR: 10.0000 meq | EXTENDED_RELEASE_TABLET | Freq: Every day | ORAL | Status: DC

## 2013-12-20 ENCOUNTER — Other Ambulatory Visit: Payer: Self-pay

## 2013-12-20 DIAGNOSIS — I10 Essential (primary) hypertension: Secondary | ICD-10-CM

## 2013-12-20 MED ORDER — HYDROCHLOROTHIAZIDE 25 MG PO TABS: 25.0000 mg | ORAL_TABLET | Freq: Every day | ORAL | Status: DC

## 2014-03-08 ENCOUNTER — Emergency Department: Payer: Self-pay | Admitting: Emergency Medicine

## 2014-08-20 ENCOUNTER — Other Ambulatory Visit: Payer: Self-pay | Admitting: *Deleted

## 2014-08-20 MED ORDER — POTASSIUM CHLORIDE ER 10 MEQ PO TBCR: 10.0000 meq | EXTENDED_RELEASE_TABLET | Freq: Every day | ORAL | Status: DC

## 2014-12-05 NOTE — Op Note (Signed)
PATIENT NAME:  Virginia Walters, NONG MR#:  [id number] DATE OF BIRTH:  [birth year]  DATE OF PROCEDURE:  07/25/2013  PREOPERATIVE DIAGNOSIS: Bimalleolar ankle fracture, right ankle, with lateral and posterior malleoli fractures, displaced.   POSTOPERATIVE DIAGNOSIS: Bimalleolar ankle fracture, right ankle, with lateral and posterior malleoli fractures, displaced.   PROCEDURE: Open reduction and internal fixation, lateral and posterior malleoli.   ANESTHESIA: General.   SURGEON: Laurene Footman, M.D.   DESCRIPTION OF PROCEDURE: The patient was brought to the operating room and after adequate anesthesia was obtained, the patient was placed in a prone position with appropriate padding. The right leg was then prepped and draped in the usual sterile fashion with a tourniquet applied to the upper thigh. After appropriate patient identification and timeout procedures were completed, the tourniquet was raised. The approach was posterolateral between the Achilles tendon and peroneal tendons. The skin and subcutaneous tissue was incised. The peroneal muscles and tendons were identified. Initially, they were retracted medially, and this gave exposure to the distal fibula. The fracture was exposed and a Soil scientist used to separate the bone fragments. This mobilized them adequately, and partial reduction could be obtained with dorsiflexion of the ankle. The peroneal tendons were then retracted laterally, and the FHL muscle was elevated off the posterior distal tibia with exposure of the posterior malleolus. This also was mobilized with the use of a Soil scientist so that it also would reduce. After this had been mobilized, the fibula was reduced and plated with a composite fibular plate from [employer]. A 6-hole plate was contoured to fit the distal fibula. The 3 proximal screws were inserted using nonlocking screws. This gave near anatomic alignment of the distal fibula, with an additional nonlocking screw and then 2  locking screws in the distal fibula. Was subsequently shortened to make sure they would not protrude into the joint. Next, a 3-hole third tubular plate was contoured and fit to the posterolateral aspect of the tibia with a protruding spike of the posterior malleolar fragment captured as an antiglide device. Two cortical screws were placed, and this gave good reduction of the posterolateral corner. C-arm views were obtained, and there was additional spike medially that was not adequately reduced with this initial plate, so a second 4-hole plate was placed more medially on the posterior aspect of the tibia. Two cortical screws were placed, and this appeared to stabilize the medial side well and gave additional reduction to this posterior malleolar fragment. The second most distal screw hole was then filled as there had previously been noted to have a fracture line into the joint surface more anteriorly and this was placed in hopes of maintaining alignment of this more anterior fracture line. On stress views, there was no further subluxation. The joint line appeared restored, and acceptable alignment appeared to have been obtained. The tourniquet was let down. The wound was closed with 2-0 Vicryl subcutaneously and skin staples. Xeroform, 4 x 4's, Webril and stirrup splint were applied, along with an Ace wrap.   ESTIMATED BLOOD LOSS: 100.   COMPLICATIONS: None.   SPECIMEN: None.   TOURNIQUET TIME: 85 minutes at 300 mmHg.   IMPLANTS: A 6-hole composite locking plate from [employer] with 2 third tubular plates, one 3 hole and one 4 hole, with cortical screws to maintain alignment of the posterior malleolar fragment.   ____________________________ Laurene Footman, MD mjm:gb D: 07/25/2013 23:20:45 ET T: 07/25/2013 23:49:04 ET JOB#: [id number]  cc: Laurene Footman, MD, <Dictator> Laurene Footman MD  ELECTRONICALLY SIGNED 07/26/2013 8:26

## 2014-12-05 NOTE — Discharge Summary (Signed)
PATIENT NAME:  Virginia Walters, Virginia Walters MR#:  [id number] DATE OF BIRTH:  20-May-1941  DATE OF ADMISSION:  07/25/2013 DATE OF DISCHARGE:  07/27/2013   ADMITTING DIAGNOSIS: Right ankle bimalleolar fracture with lateral and posterior malleoli fractures that are displaced.   DISCHARGE DIAGNOSIS: Bimalleolar ankle fracture with right ankle, lateral and posterior malleoli fractures displaced.   PROCEDURE: Open reduction and internal fixation of lateral and posterior malleoli.   ANESTHESIA: General.   SURGEON: Laurene Footman, MD   ESTIMATED BLOOD LOSS: 100 mL.    COMPLICATIONS: None.   SPECIMEN: None.   Tourniquet time: 85 minutes at 300 mmHg.  IMPLANTS: A 6-hole composite locking plate from [employer] with 2 third tubular plates, one 3-hole and one 4-four hole, cortical screws to maintain alignment of the posterior malleolar fragment.   HISTORY OF PRESENT ILLNESS: The patient is a 74 year old female who sustained the above injury on 06/11/2013 to the right ankle. She woke up in the middle of the night and rolled her ankle while tripping over her dog. She was originally evaluated at [facility] and was treated nonoperatively with a tall walking boot. She was seen in the clinic 3 weeks ago and was eager to discontinue use of the tall walking boot. She was placed into a lace-up ankle brace that she may transition out of over the next few weeks. The patient reports that she transitioned to it the next day after she was in the clinic. She was taking Aleve for pain. She has been using a walker at home. Her ankle has been increasingly swollen over the last 2 weeks, and localized pain over the medial joint line of the medial malleolus has increased. On 07/19/2013 in the clinic, the patient denied any injury, and she had not been doing much walking at all.   PHYSICAL EXAMINATION: GENERAL: Well developed, well nourished, alert and oriented x 3, no acute distress.  NEUROLOGIC: Sensation intact to right lower  extremity.  HEENT: Normocephalic, atraumatic. Pupils equal, round, and reactive to light and accommodation Mallampati score II. No dentures or partials.  CARDIOVASCULAR: Capillary refill brisk. Moderate diffuse edema of the medial and lateral malleoli. Right dorsalis pedis pulse +1.  LUNGS: Clear to auscultation. No wheezing, rales, or rhonchi. Chest expansion symmetric bilaterally.  ABDOMEN: No hepatomegaly. No splenomegaly. Soft, nontender to palpation throughout the entire abdomen. Positive bowel sounds in all 4 quadrants.  MUSCULOSKELETAL: In a wheelchair today in clinic. She is point tender to palpation over the medial malleolus and minimally over the lateral malleolus. Pain with syndesmosis squeeze. No pain with midfoot motion. Negative calcaneal squeeze.   HOSPITAL COURSE: The patient was admitted to the hospital on the 11th of December 2014. She had surgery that same day and was brought to the orthopedic floor from the PACU in stable condition. On postoperative day 1, the patient had slow progress with physical therapy. Her vital signs and labs were stable. She received postoperative antibiotics. On postoperative day 2, July 27, 2013, the patient was stable she continued to have slow progress in physical therapy and care management discussed with patient, and they agreed on going to a rehab facility for continuation of physical therapy.   CONDITION AT DISCHARGE: Stable. Vital signs were stable.   DISCHARGE INSTRUCTIONS: The patient may elevate the affected foot or leg on 1 or 2 pillows with the foot higher than the knee. She needs to elevate the heels off the bed. Use incentive spirometry every hour while awake. She is encouraged cough and  deep breathing. Toe-touch weight-bearing right lower extremity. She may resume a regular diet as tolerated. Apply an ice pack to the affected area. Do not get the dressing or bandage wet or dirty. Call [facility] if the dressing gets water  under it. Leave the dressing on. Call [facility] if any of the following occur: Bright red bleeding from the incision wound, fever above 101.5 degrees, redness, swelling, or drainage at the incision. Call [facility] if you experience any increased leg pain, numbness, or weakness in legs or bowel or bladder symptoms. Call [facility] for followup appointment for 5 to 7 days.   DISCHARGE MEDICATIONS: Biotin 5 mg 1 cap orally once a day in the morning, dorzolamide ophthalmic 2% solution 1 drop in the left eye 2 times a day, fish oil 1000 mg 2 tabs orally 2 times a day, HCTZ 25 mg 1 orally once a day in the morning, lisinopril 40 mg 1 orally once a day in the morning, melatonin 3 mg 1 orally once a day at bedtime, K-Dur 10 mg 1 orally once a day in the morning, pravastatin 40 mg 1 orally once a day in the evening, Prilosec 20 mg 1 orally once a day in the morning, multivitamins 1 orally once a day in the morning, verapamil 240 mg 24-hour oral tablet extended-release 1 orally once a day in the morning, cyanocobalamin 1000 mcg oral tablet 1 tablet orally once a day, vitamin E 400 international units 2 orally 2 times a day, calcium 600 plus D 1 tablet orally 2 times a day, clonazepam 0.5 mg 1 tablet orally every 4 hours as needed for pain, tramadol 50 mg 1 tablet orally every 4 to 6 hours as needed for pain, magnesium hydroxide 8% oral suspension 30 mL orally 2 times a day as needed for constipation, aspirin 325 mg oral delayed-release tablet 1 tablet orally once a day, lactulose 10 grams/15 mL oral syrup 30 mL months orally 2 times a day as needed for constipation, docusate calcium 240 mg oral capsule 1 cap orally once a day.   ____________________________ T. Rachelle Hora, PA-C tcg:jcm D: 07/26/2013 12:29:00 ET T: 07/26/2013 12:46:02 ET JOB#: [id number]  cc: T. Rachelle Hora, PA-C, <Dictator> Duanne Guess [place] ELECTRONICALLY SIGNED 08/06/2013 8:00

## 2014-12-30 ENCOUNTER — Emergency Department
Admission: EM | Admit: 2014-12-30 | Discharge: 2014-12-30 | Disposition: A | Discharge status: Home / Self Care | Payer: Medicare Other | Attending: Internal Medicine | Admitting: Internal Medicine

## 2014-12-30 ENCOUNTER — Encounter: Payer: Self-pay | Admitting: Emergency Medicine

## 2014-12-30 ENCOUNTER — Emergency Department: Payer: Medicare Other

## 2014-12-30 DIAGNOSIS — Y92009 Unspecified place in unspecified non-institutional (private) residence as the place of occurrence of the external cause: Secondary | ICD-10-CM | POA: Diagnosis not present

## 2014-12-30 DIAGNOSIS — S92334A Nondisplaced fracture of third metatarsal bone, right foot, initial encounter for closed fracture: Secondary | ICD-10-CM | POA: Insufficient documentation

## 2014-12-30 DIAGNOSIS — Y9301 Activity, walking, marching and hiking: Secondary | ICD-10-CM | POA: Diagnosis not present

## 2014-12-30 DIAGNOSIS — Z7982 Long term (current) use of aspirin: Secondary | ICD-10-CM | POA: Diagnosis not present

## 2014-12-30 DIAGNOSIS — Y998 Other external cause status: Secondary | ICD-10-CM | POA: Diagnosis not present

## 2014-12-30 DIAGNOSIS — Z79899 Other long term (current) drug therapy: Secondary | ICD-10-CM | POA: Diagnosis not present

## 2014-12-30 DIAGNOSIS — S92901A Unspecified fracture of right foot, initial encounter for closed fracture: Secondary | ICD-10-CM

## 2014-12-30 DIAGNOSIS — Z87891 Personal history of nicotine dependence: Secondary | ICD-10-CM | POA: Insufficient documentation

## 2014-12-30 DIAGNOSIS — I10 Essential (primary) hypertension: Secondary | ICD-10-CM | POA: Diagnosis not present

## 2014-12-30 DIAGNOSIS — S99921A Unspecified injury of right foot, initial encounter: Secondary | ICD-10-CM | POA: Diagnosis present

## 2014-12-30 DIAGNOSIS — W2209XA Striking against other stationary object, initial encounter: Secondary | ICD-10-CM | POA: Diagnosis not present

## 2014-12-30 DIAGNOSIS — S9031XA Contusion of right foot, initial encounter: Secondary | ICD-10-CM | POA: Diagnosis not present

## 2014-12-30 MED ORDER — HYDROCODONE-ACETAMINOPHEN 5-325 MG PO TABS
ORAL_TABLET | ORAL | Status: AC
  Administered 2014-12-30: 1 via ORAL
  Filled 2014-12-30: qty 1

## 2014-12-30 MED ORDER — HYDROCODONE-ACETAMINOPHEN 5-325 MG PO TABS
1.0000 | ORAL_TABLET | Freq: Once | ORAL | Status: AC
Start: 2014-12-30 — End: 2014-12-30
  Administered 2014-12-30: 1 via ORAL

## 2014-12-30 MED ORDER — HYDROCODONE-ACETAMINOPHEN 5-325 MG PO TABS: 0.5000 | ORAL_TABLET | Freq: Three times a day (TID) | ORAL | Status: DC | PRN

## 2014-12-30 NOTE — ED Notes (Addendum)
Pt to ed with c/o right foot pain and swelling and bruising.  Pt states she tripped over a stool and fell while letting her dog out in the middle of the night.  Pt with positive pulse, movement and sensation in right foot however a large amount of swelling is noted.

## 2014-12-30 NOTE — Discharge Instructions (Signed)
Take medications as prescribed. Keep foot and splint. Use 4 pronged walker at home. Avoid weightbearing on right foot. Apply ice and keep elevated.  Follow-up with orthopedic next week as discussed. See above to call today to schedule.  Return to ER for new or worsening concerns  Metatarsal Fracture, Undisplaced A metatarsal fracture is a break in the bone(s) of the foot. These are the bones of the foot that connect your toes to the bones of the ankle. DIAGNOSIS  The diagnoses of these fractures are usually made with X-rays. If there are problems in the forefoot and x-rays are normal a later bone scan will usually make the diagnosis.  TREATMENT AND HOME CARE INSTRUCTIONS  Treatment may or may not include a cast or walking shoe. When casts are needed the use is usually for short periods of time so as not to slow down healing with muscle wasting (atrophy).  Activities should be stopped until further advised by your caregiver.  Wear shoes with adequate shock absorbing capabilities and stiff soles.  Alternative exercise may be undertaken while waiting for healing. These may include bicycling and swimming, or as your caregiver suggests.  It is important to keep all follow-up visits or specialty referrals. The failure to keep these appointments could result in improper bone healing and chronic pain or disability.  Warning: Do not drive a car or operate a motor vehicle until your caregiver specifically tells you it is safe to do so. IF YOU DO NOT HAVE A CAST OR SPLINT:  You may walk on your injured foot as tolerated or advised.  Do not put any weight on your injured foot for as long as directed by your caregiver. Slowly increase the amount of time you walk on the foot as the pain allows or as advised.  Use crutches until you can bear weight without pain. A gradual increase in weight bearing may help.  Apply ice to the injury for 15-20 minutes each hour while awake for the first 2 days. Put  the ice in a plastic bag and place a towel between the bag of ice and your skin.  Only take over-the-counter or prescription medicines for pain, discomfort, or fever as directed by your caregiver. SEEK IMMEDIATE MEDICAL CARE IF:   Your cast gets damaged or breaks.  You have continued severe pain or more swelling than you did before the cast was put on, or the pain is not controlled with medications.  Your skin or nails below the injury turn blue or grey, or feel cold or numb.  There is a bad smell, or new stains or pus-like (purulent) drainage coming from the cast. MAKE SURE YOU:   Understand these instructions.  Will watch your condition.  Will get help right away if you are not doing well or get worse. Document Released: 04/23/2002 Document Revised: 10/24/2011 Document Reviewed: 03/14/2008 [facility] Patient Information 2015 [place], [place]. This information is not intended to replace advice given to you by your health care provider. Make sure you discuss any questions you have with your health care provider.

## 2014-12-30 NOTE — ED Provider Notes (Signed)
[facility] Emergency Department Provider Note ____________________________________________  Time seen: Approximately 9:24 AM  I have reviewed the triage vital signs and the nursing notes.   HISTORY  Chief Complaint Foot Pain   HPI Virginia Walters is a 74 y.o. female presents to the ER for right foot pain. Patient states she got up last night to let small dog outside. Patient states as she was walking through her house she hit her right foot on a piece of furniture. Patient states she did not fall. Patient states that she was then able to walk and go back to bed. States right foot pain is 5-6 out of 10. Aching pain. Worse with movement and attempt to weight bear.  Denies fall, head injury, loss of consciousness, neck or back pain, hip pain, left leg pain or other pain or injuries.   Denies history of falls at home. Friend at bedside.   Past Medical History  Diagnosis Date  . HTN (hypertension)   . Dyslipidemia   . PFO (patent foramen ovale)   . Retinal artery occlusion   . GERD (gastroesophageal reflux disease)     Patient Active Problem List   Diagnosis Date Noted  . Abnormal EKG 12/28/2010  . HTN (hypertension)   . Dyslipidemia   . PFO (patent foramen ovale)   . Retinal artery occlusion   . GERD (gastroesophageal reflux disease)     Past Surgical History  Procedure Laterality Date  . Appendectomy    . Tubal ligation    . Ankle arthroscopy with open reduction internal fixation (orif) Right     Current Outpatient Rx  Name  Route  Sig  Dispense  Refill  . aspirin 81 MG tablet   Oral   Take 81 mg by mouth daily.           . B Complex-Biotin-FA (B-COMPLEX PO)   Oral   Take 1 capsule by mouth daily.          . Calcium Carbonate-Vitamin D (CALCIUM + D PO)   Oral   Take 1 capsule by mouth 2 (two) times daily.          . Cholecalciferol (VITAMIN D) 2000 UNITS CAPS   Oral   Take 1 capsule by mouth daily.          . clonazePAM  (KLONOPIN) 0.5 MG tablet   Oral   Take 0.5 mg by mouth 2 (two) times daily as needed.         . dorzolamide (TRUSOPT) 2 % ophthalmic solution      1 drop 2 (two) times daily.           Marland Kitchen FLUoxetine (PROZAC) 20 MG capsule   Oral   Take 20 mg by mouth daily.         . hydrochlorothiazide (HYDRODIURIL) 25 MG tablet   Oral   Take 1 tablet (25 mg total) by mouth daily.   30 tablet   11   . lisinopril (PRINIVIL,ZESTRIL) 40 MG tablet   Oral   Take 40 mg by mouth daily.           . Multiple Vitamin (MULTIVITAMIN) tablet   Oral   Take 1 tablet by mouth daily.           . Naproxen Sodium (ALEVE PO)   Oral   Take 1 capsule by mouth as needed.          Marland Kitchen omeprazole (PRILOSEC) 20 MG capsule   Oral  Take 20 mg by mouth daily.           . potassium chloride (KLOR-CON 10) 10 MEQ tablet   Oral   Take 1 tablet (10 mEq total) by mouth daily.   30 tablet   11   . pravastatin (PRAVACHOL) 40 MG tablet   Oral   Take 40 mg by mouth daily.           . verapamil (COVERA HS) 240 MG (CO) 24 hr tablet   Oral   Take 240 mg by mouth at bedtime.           . fish oil-omega-3 fatty acids 1000 MG capsule   Oral   Take 2 g by mouth 2 (two) times daily.           Marland Kitchen VITAMIN E PO   Oral   Take 1 capsule by mouth 2 (two) times daily.            Allergies Oxycodone  States NOT allergic to vicodin  Family History  Problem Relation Age of Onset  . Esophageal cancer Father   . Dementia Mother     Social History History  Substance Use Topics  . Smoking status: Former Smoker    Quit date: 12/27/1980  . Smokeless tobacco: Not on file  . Alcohol Use: Yes     Comment: occassionally    Review of Systems Constitutional: No fever/chills Eyes: No visual changes. ENT: No sore throat. Cardiovascular: Denies chest pain. Respiratory: Denies shortness of breath. Gastrointestinal: No abdominal pain.  No nausea, no vomiting.  No diarrhea.  No  constipation. Genitourinary: Negative for dysuria. Musculoskeletal: Negative for back pain. Right foot pain as above. Skin: Negative for rash. Neurological: Negative for headaches, focal weakness or numbness.  10-point ROS otherwise negative.  ____________________________________________   PHYSICAL EXAM:  VITAL SIGNS: ED Triage Vitals  Enc Vitals Group     BP 12/30/14 0731 139/82 mmHg     Pulse Rate 12/30/14 0731 86     Resp 12/30/14 0731 18     Temp 12/30/14 0731 98.3 F (36.8 C)     Temp Source 12/30/14 0731 Oral     SpO2 12/30/14 0731 97 %     Weight 12/30/14 0731 220 lb (99.791 kg)     Height 12/30/14 0731 5\' 7"  (1.702 m)     Head Cir --      Peak Flow --      Pain Score 12/30/14 0732 5     Pain Loc --      Pain Edu? --      Excl. in [place]? --     Constitutional: Alert and oriented. Well appearing and in no acute distress. Eyes: Conjunctivae are normal. PERRL. EOMI. Head: Atraumatic. Nose: No congestion/rhinnorhea. Mouth/Throat: Mucous membranes are moist.  Oropharynx non-erythematous. Neck: No stridor.  No cervical spine tenderness to palpation. Hematological/Lymphatic/Immunilogical: No cervical lymphadenopathy. Cardiovascular: Normal rate, regular rhythm. Grossly normal heart sounds.  Good peripheral circulation. Respiratory: Normal respiratory effort.  No retractions. Lungs CTAB. Gastrointestinal: Soft and nontender. No distention. No abdominal bruits. No CVA tenderness. Musculoskeletal: No lower extremity tenderness nor edema.  No joint effusions. Except: Right dorsal foot moderate tender to palpation, moderate swelling and moderate ecchymosis. Skin intact. No erythema. Sensation intact. Distal pedal pulses equal bilaterally and easily found. Right ankle nontender. Pain increases with movement.  Right lower extremity nontender above the right foot. Left lower extremity nontender. Neurologic:  Normal speech and language. No gross focal neurologic  deficits are  appreciated. Speech is normal. No gait instability. Skin:  Skin is warm, dry and intact. No rash noted. Psychiatric: Mood and affect are normal. Speech and behavior are normal.  _____________________________________________________________________________________  RADIOLOGY  RIGHT FOOT COMPLETE - 3+ VIEW  COMPARISON: Right ankle 07/25/2013  FINDINGS: Three views of the right foot submitted. There is nondisplaced comminuted fracture at the base of third metatarsal. Nondisplaced fracture distal aspect of fourth metatarsal. Nondisplaced fracture medial aspect at the base of proximal phalanx great toe. Old fracture deformity proximal phalanx second third and fourth toes. Plantar and posterior spurring of calcaneus. Metallic fixation plate and screws in distal tibia and fibula. Mild degenerative changes tibiotalar joint. Soft tissue swelling dorsal metatarsal region.  IMPRESSION: There is nondisplaced comminuted fracture at the base of third metatarsal. Nondisplaced fracture distal aspect of fourth metatarsal. Nondisplaced fracture medial aspect at the base of proximal phalanx great toe. Old fracture deformity proximal phalanx second third and fourth toes. Plantar and posterior spurring of calcaneus. Soft tissue swelling dorsal metatarsal region.   Electronically Signed By: [place] Crocker M.D. On: 12/30/2014 08:40 ____________________________________________   PROCEDURES  Procedure(s) performed:  SPLINT APPLICATION Date/Time: 16:10 AM Authorized by: Marylene Land Consent: Verbal consent obtained. Risks and benefits: risks, benefits and alternatives were discussed Consent given by: patient Splint applied by: ed  technician Location details: right  Splint type: posterior foot   Supplies used: ocl Post-procedure: The splinted body part was neurovascularly unchanged following the procedure. Patient tolerance: Patient tolerated the procedure well with no immediate  complications.   Pt has 4 pronged walker at home. ____________________________________________   INITIAL IMPRESSION / ASSESSMENT AND PLAN / ED COURSE  Pertinent labs & imaging results that were available during my care of the patient were reviewed by me and considered in my medical decision making (see chart for details).  Very well-appearing patient. Friend at bedside. Complains of right foot pain post injury last night when accidentally ran into furniture with right foot. Patient denies fall or other injury. X-ray positive for nondisplaced comminuted fracture at the base of third metatarsal, nondisplaced fracture distal aspect of fourth metatarsal, nondisplaced fracture medial aspect of base of proximal phalanx great toe.  0920: Paged orthopedic.  0940: Discussed patient and planning care of Dr. Mack Guise. Per Dr. Mack Guise patient be placed in splint. Use walker at home. Nonweightbearing right foot. Patient to follow up with Dr. Mack Guise orthopedics early next week. Elevation.ice.   Patient and family agree to plan. Pt with walker at home.    ____________________________________________   FINAL CLINICAL IMPRESSION(S) / ED DIAGNOSES  Final diagnoses:  Foot fracture, right, closed, initial encounter  Contusion Right foot Right first nondisplaced toe fracture    Marylene Land, NP 12/30/14 [place], DO 12/30/14 1101

## 2015-01-07 ENCOUNTER — Other Ambulatory Visit: Payer: Self-pay

## 2015-01-07 DIAGNOSIS — I10 Essential (primary) hypertension: Secondary | ICD-10-CM

## 2015-01-07 MED ORDER — HYDROCHLOROTHIAZIDE 25 MG PO TABS: 25.0000 mg | ORAL_TABLET | Freq: Every day | ORAL | Status: DC

## 2015-01-07 NOTE — Telephone Encounter (Signed)
Per note 4.3.15

## 2016-01-15 ENCOUNTER — Other Ambulatory Visit: Payer: Self-pay | Admitting: *Deleted

## 2016-01-15 DIAGNOSIS — I10 Essential (primary) hypertension: Secondary | ICD-10-CM

## 2016-01-15 NOTE — Telephone Encounter (Signed)
Spoke with patient and she will contact her pcp in regards to the refill.

## 2016-01-15 NOTE — Telephone Encounter (Signed)
Patient is supposed to contact PCP for refills per Dr. Elmarie Shiley note from 4/15.  She may schedule an appointment if she is having any issues.

## 2016-01-15 NOTE — Telephone Encounter (Signed)
Please advise on request as patient has not been seen in over two year and is prn follow up. Thanks, MI

## 2016-02-25 LAB — IFOBT (OCCULT BLOOD): IFOBT: NEGATIVE

## 2016-07-20 ENCOUNTER — Other Ambulatory Visit: Payer: Self-pay | Admitting: Registered Nurse

## 2016-07-20 DIAGNOSIS — M7918 Myalgia, other site: Secondary | ICD-10-CM

## 2016-07-20 DIAGNOSIS — M5416 Radiculopathy, lumbar region: Secondary | ICD-10-CM

## 2016-07-20 DIAGNOSIS — S32009A Unspecified fracture of unspecified lumbar vertebra, initial encounter for closed fracture: Secondary | ICD-10-CM

## 2016-08-01 ENCOUNTER — Other Ambulatory Visit: Payer: Medicare Other

## 2016-08-12 ENCOUNTER — Ambulatory Visit
Admission: RE | Admit: 2016-08-12 | Discharge: 2016-08-12 | Disposition: A | Discharge status: Home / Self Care | Payer: Medicare Other | Source: Ambulatory Visit | Attending: Registered Nurse | Admitting: Registered Nurse

## 2016-08-12 DIAGNOSIS — S32009A Unspecified fracture of unspecified lumbar vertebra, initial encounter for closed fracture: Secondary | ICD-10-CM

## 2016-08-12 DIAGNOSIS — M7918 Myalgia, other site: Secondary | ICD-10-CM

## 2016-08-12 DIAGNOSIS — M5416 Radiculopathy, lumbar region: Secondary | ICD-10-CM

## 2016-09-16 ENCOUNTER — Emergency Department (HOSPITAL_COMMUNITY)
Admission: EM | Admit: 2016-09-16 | Discharge: 2016-09-16 | Disposition: A | Discharge status: Home / Self Care | Payer: Medicare Other | Attending: Emergency Medicine | Admitting: Emergency Medicine

## 2016-09-16 ENCOUNTER — Emergency Department (HOSPITAL_COMMUNITY): Payer: Medicare Other

## 2016-09-16 ENCOUNTER — Other Ambulatory Visit: Payer: Self-pay

## 2016-09-16 ENCOUNTER — Encounter (HOSPITAL_COMMUNITY): Payer: Self-pay | Admitting: *Deleted

## 2016-09-16 DIAGNOSIS — Z79899 Other long term (current) drug therapy: Secondary | ICD-10-CM | POA: Diagnosis not present

## 2016-09-16 DIAGNOSIS — Y939 Activity, unspecified: Secondary | ICD-10-CM | POA: Insufficient documentation

## 2016-09-16 DIAGNOSIS — Y999 Unspecified external cause status: Secondary | ICD-10-CM | POA: Diagnosis not present

## 2016-09-16 DIAGNOSIS — W19XXXA Unspecified fall, initial encounter: Secondary | ICD-10-CM | POA: Diagnosis not present

## 2016-09-16 DIAGNOSIS — S40022A Contusion of left upper arm, initial encounter: Secondary | ICD-10-CM | POA: Diagnosis not present

## 2016-09-16 DIAGNOSIS — I10 Essential (primary) hypertension: Secondary | ICD-10-CM | POA: Diagnosis not present

## 2016-09-16 DIAGNOSIS — F1010 Alcohol abuse, uncomplicated: Secondary | ICD-10-CM | POA: Diagnosis not present

## 2016-09-16 DIAGNOSIS — N289 Disorder of kidney and ureter, unspecified: Secondary | ICD-10-CM | POA: Insufficient documentation

## 2016-09-16 DIAGNOSIS — Z7982 Long term (current) use of aspirin: Secondary | ICD-10-CM | POA: Insufficient documentation

## 2016-09-16 DIAGNOSIS — R4182 Altered mental status, unspecified: Secondary | ICD-10-CM | POA: Diagnosis not present

## 2016-09-16 DIAGNOSIS — Y92009 Unspecified place in unspecified non-institutional (private) residence as the place of occurrence of the external cause: Secondary | ICD-10-CM | POA: Insufficient documentation

## 2016-09-16 DIAGNOSIS — S4992XA Unspecified injury of left shoulder and upper arm, initial encounter: Secondary | ICD-10-CM | POA: Diagnosis present

## 2016-09-16 LAB — BASIC METABOLIC PANEL
Anion gap: 14 (ref 5–15)
BUN: 33 mg/dL — ABNORMAL HIGH (ref 6–20)
CO2: 24 mmol/L (ref 22–32)
Calcium: 10 mg/dL (ref 8.9–10.3)
Chloride: 102 mmol/L (ref 101–111)
Creatinine, Ser: 1.68 mg/dL — ABNORMAL HIGH (ref 0.44–1.00)
GFR calc Af Amer: 33 mL/min — ABNORMAL LOW (ref >60)
GFR calc non Af Amer: 29 mL/min — ABNORMAL LOW (ref >60)
Glucose, Bld: 108 mg/dL — ABNORMAL HIGH (ref 65–99)
Potassium: 4 mmol/L (ref 3.5–5.1)
Sodium: 140 mmol/L (ref 135–145)

## 2016-09-16 LAB — RAPID URINE DRUG SCREEN, HOSP PERFORMED
Amphetamines: NOT DETECTED
Barbiturates: NOT DETECTED
Benzodiazepines: POSITIVE — AB
Cocaine: NOT DETECTED
Opiates: NOT DETECTED
Tetrahydrocannabinol: NOT DETECTED

## 2016-09-16 LAB — URINALYSIS, ROUTINE W REFLEX MICROSCOPIC
Bilirubin Urine: NEGATIVE
Glucose, UA: NEGATIVE mg/dL
Hgb urine dipstick: NEGATIVE
Ketones, ur: 20 mg/dL — AB
Nitrite: NEGATIVE
Protein, ur: 30 mg/dL — AB
Specific Gravity, Urine: 1.026 (ref 1.005–1.030)
pH: 5 (ref 5.0–8.0)

## 2016-09-16 LAB — HEPATIC FUNCTION PANEL
ALT: 22 U/L (ref 14–54)
AST: 29 U/L (ref 15–41)
Albumin: 4.2 g/dL (ref 3.5–5.0)
Alkaline Phosphatase: 66 U/L (ref 38–126)
Bilirubin, Direct: 0.3 mg/dL (ref 0.1–0.5)
Indirect Bilirubin: 1 mg/dL — ABNORMAL HIGH (ref 0.3–0.9)
Total Bilirubin: 1.3 mg/dL — ABNORMAL HIGH (ref 0.3–1.2)
Total Protein: 7.6 g/dL (ref 6.5–8.1)

## 2016-09-16 LAB — AMMONIA: Ammonia: 15 umol/L (ref 9–35)

## 2016-09-16 LAB — CBC
HCT: 41.4 % (ref 36.0–46.0)
Hemoglobin: 14.1 g/dL (ref 12.0–15.0)
MCH: 33.1 pg (ref 26.0–34.0)
MCHC: 34.1 g/dL (ref 30.0–36.0)
MCV: 97.2 fL (ref 78.0–100.0)
Platelets: 229 10*3/uL (ref 150–400)
RBC: 4.26 MIL/uL (ref 3.87–5.11)
RDW: 12.2 % (ref 11.5–15.5)
WBC: 6.7 10*3/uL (ref 4.0–10.5)

## 2016-09-16 LAB — ETHANOL: Alcohol, Ethyl (B): <5 mg/dL (ref <5)

## 2016-09-16 MED ORDER — VITAMIN B-1 100 MG PO TABS
100.0000 mg | ORAL_TABLET | Freq: Once | ORAL | Status: AC
  Administered 2016-09-16: 100 mg via ORAL
  Filled 2016-09-16: qty 1

## 2016-09-16 MED ORDER — FOLIC ACID 1 MG PO TABS
1.0000 mg | ORAL_TABLET | Freq: Once | ORAL | Status: AC
  Administered 2016-09-16: 1 mg via ORAL
  Filled 2016-09-16: qty 1

## 2016-09-16 MED ORDER — SODIUM CHLORIDE 0.9 % IV BOLUS (SEPSIS)
1000.0000 mL | Freq: Once | INTRAVENOUS | Status: AC
  Administered 2016-09-16: 1000 mL via INTRAVENOUS

## 2016-09-16 NOTE — ED Provider Notes (Signed)
7:50 PM patient feels well and denies pain anywhere. She feels ready to go home. X-rays viewed by me Results for orders placed or performed during the hospital encounter of [id number]  Basic metabolic panel  Result Value Ref Range   Sodium 140 135 - 145 mmol/L   Potassium 4.0 3.5 - 5.1 mmol/L   Chloride 102 101 - 111 mmol/L   CO2 24 22 - 32 mmol/L   Glucose, Bld 108 (H) 65 - 99 mg/dL   BUN 33 (H) 6 - 20 mg/dL   Creatinine, Ser 1.68 (H) 0.44 - 1.00 mg/dL   Calcium 10.0 8.9 - 10.3 mg/dL   GFR calc non Af Amer 29 (L) >60 mL/min   GFR calc Af Amer 33 (L) >60 mL/min   Anion gap 14 5 - 15  CBC  Result Value Ref Range   WBC 6.7 4.0 - 10.5 K/uL   RBC 4.26 3.87 - 5.11 MIL/uL   Hemoglobin 14.1 12.0 - 15.0 g/dL   HCT 41.4 36.0 - 46.0 %   MCV 97.2 78.0 - 100.0 fL   MCH 33.1 26.0 - 34.0 pg   MCHC 34.1 30.0 - 36.0 g/dL   RDW 12.2 11.5 - 15.5 %   Platelets 229 150 - 400 K/uL  Urinalysis, Routine w reflex microscopic  Result Value Ref Range   Color, Urine AMBER (A) YELLOW   APPearance HAZY (A) CLEAR   Specific Gravity, Urine 1.026 1.005 - 1.030   pH 5.0 5.0 - 8.0   Glucose, UA NEGATIVE NEGATIVE mg/dL   Hgb urine dipstick NEGATIVE NEGATIVE   Bilirubin Urine NEGATIVE NEGATIVE   Ketones, ur 20 (A) NEGATIVE mg/dL   Protein, ur 30 (A) NEGATIVE mg/dL   Nitrite NEGATIVE NEGATIVE   Leukocytes, UA LARGE (A) NEGATIVE   RBC / HPF 0-5 0 - 5 RBC/hpf   WBC, UA TOO NUMEROUS TO COUNT 0 - 5 WBC/hpf   Bacteria, UA FEW (A) NONE SEEN   Squamous Epithelial / LPF 0-5 (A) NONE SEEN   Mucous PRESENT    Hyaline Casts, UA PRESENT    Non Squamous Epithelial 0-5 (A) NONE SEEN  Ethanol  Result Value Ref Range   Alcohol, Ethyl (B) <5 <5 mg/dL  Rapid urine drug screen (hospital performed)  Result Value Ref Range   Opiates NONE DETECTED NONE DETECTED   Cocaine NONE DETECTED NONE DETECTED   Benzodiazepines POSITIVE (A) NONE DETECTED   Amphetamines NONE DETECTED NONE DETECTED   Tetrahydrocannabinol NONE DETECTED  NONE DETECTED   Barbiturates NONE DETECTED NONE DETECTED  Hepatic function panel  Result Value Ref Range   Total Protein 7.6 6.5 - 8.1 g/dL   Albumin 4.2 3.5 - 5.0 g/dL   AST 29 15 - 41 U/L   ALT 22 14 - 54 U/L   Alkaline Phosphatase 66 38 - 126 U/L   Total Bilirubin 1.3 (H) 0.3 - 1.2 mg/dL   Bilirubin, Direct 0.3 0.1 - 0.5 mg/dL   Indirect Bilirubin 1.0 (H) 0.3 - 0.9 mg/dL  Ammonia  Result Value Ref Range   Ammonia 15 9 - 35 umol/L   Ct Head Wo Contrast  Result Date: 09/16/2016 CLINICAL DATA:  Status post fall, with altered mental status. Initial encounter. EXAM: CT HEAD WITHOUT CONTRAST TECHNIQUE: Contiguous axial images were obtained from the base of the skull through the vertex without intravenous contrast. COMPARISON:  None. FINDINGS: Brain: No evidence of acute infarction, hemorrhage, hydrocephalus, extra-axial collection or mass lesion/mass effect. Prominence of the ventricles and sulci  reflects mild cortical volume loss. Mild cerebellar atrophy is noted. Scattered periventricular and subcortical white matter change likely reflects small vessel ischemic microangiopathy. The brainstem and fourth ventricle are within normal limits. The basal ganglia are unremarkable in appearance. The cerebral hemispheres demonstrate grossly normal gray-white differentiation. No mass effect or midline shift is seen. Vascular: No hyperdense vessel or unexpected calcification. Skull: There is no evidence of fracture; visualized osseous structures are unremarkable in appearance. Sinuses/Orbits: The orbits are within normal limits. The paranasal sinuses and mastoid air cells are well-aerated. Other: No significant soft tissue abnormalities are seen. IMPRESSION: 1. No acute intracranial pathology seen on CT. 2. Mild cortical volume loss and scattered small vessel ischemic microangiopathy. Electronically Signed   By: Garald Balding M.D.   On: 09/16/2016 19:44   Dg Humerus Left  Result Date: 09/16/2016 CLINICAL  DATA:  Status post fall, with left arm pain and bruising. Initial encounter. EXAM: LEFT HUMERUS - 2+ VIEW COMPARISON:  None. FINDINGS: There is no evidence of fracture or dislocation. The left humerus appears grossly intact. The left humeral head remains seated at the glenoid fossa. Degenerative change is noted at the left acromioclavicular joint. Soft tissue swelling is noted about the left arm. IMPRESSION: No evidence of fracture or dislocation. Electronically Signed   By: Garald Balding M.D.   On: 09/16/2016 18:17     Orlie Dakin, MD 09/16/16 [phone]

## 2016-09-16 NOTE — ED Notes (Signed)
Mariann Laster, Case Manager discussing home health options with family

## 2016-09-16 NOTE — ED Notes (Signed)
Pt transported to CT, then will be transported to xray.

## 2016-09-16 NOTE — ED Triage Notes (Addendum)
Pt in from home, daughter reports getting in from out of town to find feces, urine, & blood in multiple places throughout her mother's home, reports frequent ETOH use & not eating, pt seen by PCP today & sent here for medical & psych eval, pt confused, pt disoriented to age & time,  pt has contusion to L upper arm 12 cm x 5 cm, pt reported to have multiple falls recently, the pt called EMS this Weds, d/t falling & could not get up & refused transfer per pt, A&O x4

## 2016-09-16 NOTE — ED Provider Notes (Signed)
[facility] DEPT Provider Note   CSN: [id number] Arrival date & time: 09/16/16  1208     History   Chief Complaint Chief Complaint  Patient presents with  . Fall  . Arm Pain    HPI Virginia Walters is a 76 y.o. female.  HPI Patient presents to the emergency department today after family found that the patient's had increasing falls and increasing alcohol use.  Family reports that she has not been eating and drinking well and that she's been drinking more alcohol.  They feel like she is not taking care of herself and found feces and urine multiple places around the house.  She also presents with pain in that ecchymosis to her left proximal humerus.  The patient is alert and oriented 4.  Family drove up from [place] to see the patient.  Patient admits to drinking 1-2 bottles of wine daily.  She has no abdominal pain chest pain shortness of breath.  No dysuria or urinary frequency   Past Medical History:  Diagnosis Date  . Dyslipidemia   . GERD (gastroesophageal reflux disease)   . HTN (hypertension)   . PFO (patent foramen ovale)   . Retinal artery occlusion     Patient Active Problem List   Diagnosis Date Noted  . Abnormal EKG 12/28/2010  . HTN (hypertension)   . Dyslipidemia   . PFO (patent foramen ovale)   . Retinal artery occlusion   . GERD (gastroesophageal reflux disease)     Past Surgical History:  Procedure Laterality Date  . ANKLE ARTHROSCOPY WITH OPEN REDUCTION INTERNAL FIXATION (ORIF) Right   . APPENDECTOMY    . TUBAL LIGATION      OB History    Gravida Para Term Preterm AB Living   0 0 0 0 0 0   SAB TAB Ectopic Multiple Live Births   0 0 0 0         Home Medications    Prior to Admission medications   Medication Sig Start Date End Date Taking? Authorizing Provider  aspirin 81 MG tablet Take 81 mg by mouth daily.     Yes Historical Provider, MD  clonazePAM (KLONOPIN) 0.5 MG tablet Take 0.5 mg by mouth 2 (two) times daily as needed for  anxiety.    Yes Historical Provider, MD  FLUoxetine (PROZAC) 20 MG capsule Take 20 mg by mouth daily.   Yes Historical Provider, MD  gabapentin (NEURONTIN) 300 MG capsule Take 300 mg by mouth at bedtime.   Yes Historical Provider, MD  hydrochlorothiazide (HYDRODIURIL) 25 MG tablet Take 1 tablet (25 mg total) by mouth daily. 01/07/15  Yes Thayer Headings, MD  lisinopril (PRINIVIL,ZESTRIL) 40 MG tablet Take 40 mg by mouth daily.     Yes Historical Provider, MD  MELATONIN PO Take 1 tablet by mouth at bedtime.   Yes Historical Provider, MD  Multiple Vitamins-Minerals (HAIR/SKIN/NAILS) CAPS Take 1 capsule by mouth daily.    Yes Historical Provider, MD  potassium chloride (KLOR-CON 10) 10 MEQ tablet Take 1 tablet (10 mEq total) by mouth daily. 08/20/14  Yes Thayer Headings, MD  pravastatin (PRAVACHOL) 40 MG tablet Take 40 mg by mouth daily.     Yes Historical Provider, MD  TURMERIC PO Take 1 capsule by mouth daily.   Yes Historical Provider, MD  verapamil (COVERA HS) 240 MG (CO) 24 hr tablet Take 240 mg by mouth daily.    Yes Historical Provider, MD  B Complex-Biotin-FA (B-COMPLEX PO) Take 1 capsule by  mouth daily.     Historical Provider, MD  Calcium Carbonate-Vitamin D (CALCIUM + D PO) Take 1 capsule by mouth 2 (two) times daily.     Historical Provider, MD  Cholecalciferol (VITAMIN D) 2000 UNITS CAPS Take 1 capsule by mouth daily.     Historical Provider, MD  dorzolamide (TRUSOPT) 2 % ophthalmic solution 1 drop 2 (two) times daily.      Historical Provider, MD  fish oil-omega-3 fatty acids 1000 MG capsule Take 2 g by mouth 2 (two) times daily.      Historical Provider, MD  HYDROcodone-acetaminophen (NORCO/VICODIN) 5-325 MG per tablet Take 0.5-1 tablets by mouth every 8 (eight) hours as needed for moderate pain or severe pain. 12/30/14   Marylene Land, NP  Multiple Vitamin (MULTIVITAMIN) tablet Take 1 tablet by mouth daily.      Historical Provider, MD  omeprazole (PRILOSEC) 20 MG capsule Take 20 mg by  mouth daily.      Historical Provider, MD  VITAMIN E PO Take 1 capsule by mouth 2 (two) times daily.     Historical Provider, MD    Family History Family History  Problem Relation Age of Onset  . Esophageal cancer Father   . Dementia Mother     Social History Social History  Substance Use Topics  . Smoking status: Former Smoker    Quit date: 12/27/1980  . Smokeless tobacco: Never Used  . Alcohol use Yes     Comment: occassionally     Allergies   Oxycodone   Review of Systems Review of Systems  All other systems reviewed and are negative.    Physical Exam Updated Vital Signs BP 150/91   Pulse 73   Temp 98.3 F (36.8 C) (Oral)   Resp 19   Ht 5\' 5"  (1.651 m)   Wt 220 lb (99.8 kg)   SpO2 99%   BMI 36.61 kg/m   Physical Exam  Constitutional: She is oriented to person, place, and time. She appears well-developed and well-nourished. No distress.  HENT:  Head: Normocephalic and atraumatic.  Eyes: EOM are normal.  Neck: Normal range of motion. Neck supple.  Cardiovascular: Normal rate, regular rhythm and normal heart sounds.   Pulmonary/Chest: Effort normal and breath sounds normal.  Abdominal: Soft. She exhibits no distension. There is no tenderness.  Musculoskeletal:  Ecchymosis noted of her left proximal humerus with mild tenderness.  Normal left radial pulse.  Full range motion bilateral hips, knees, ankles.  Full range of motion bilateral elbows, wrists.  Neurological: She is alert and oriented to person, place, and time.  Skin: Skin is warm and dry.  Psychiatric: She has a normal mood and affect. Judgment normal.  Nursing note and vitals reviewed.    ED Treatments / Results  Labs (all labs ordered are listed, but only abnormal results are displayed) Labs Reviewed  BASIC METABOLIC PANEL - Abnormal; Notable for the following:       Result Value   Glucose, Bld 108 (*)    BUN 33 (*)    Creatinine, Ser 1.68 (*)    GFR calc non Af Amer 29 (*)    GFR calc  Af Amer 33 (*)    All other components within normal limits  URINALYSIS, ROUTINE W REFLEX MICROSCOPIC - Abnormal; Notable for the following:    Color, Urine AMBER (*)    APPearance HAZY (*)    Ketones, ur 20 (*)    Protein, ur 30 (*)    Leukocytes, UA LARGE (*)  Bacteria, UA FEW (*)    Squamous Epithelial / LPF 0-5 (*)    Non Squamous Epithelial 0-5 (*)    All other components within normal limits  RAPID URINE DRUG SCREEN, HOSP PERFORMED - Abnormal; Notable for the following:    Benzodiazepines POSITIVE (*)    All other components within normal limits  HEPATIC FUNCTION PANEL - Abnormal; Notable for the following:    Total Bilirubin 1.3 (*)    Indirect Bilirubin 1.0 (*)    All other components within normal limits  URINE CULTURE  CBC  ETHANOL  AMMONIA  CBG MONITORING, ED    EKG  EKG Interpretation None       Radiology No results found.  Procedures Procedures (including critical care time)  Medications Ordered in ED Medications  thiamine (VITAMIN B-1) tablet 100 mg (100 mg Oral Given [id number])  folic acid (FOLVITE) tablet 1 mg (1 mg Oral Given 09/16/16 1631)     Initial Impression / Assessment and Plan / ED Course  I have reviewed the triage vital signs and the nursing notes.  Pertinent labs & imaging results that were available during my care of the patient were reviewed by me and considered in my medical decision making (see chart for details).     Mild renal insufficiency.  Patient encouraged to increase oral hydration at home.  The patient is alert and oriented 4.  I do not think she needs acute hospitalization at this time.  CT head is without abnormality on my read.  I will wait for the radiologist to read the CT head.  Left humerus films are pending.  I long discussion with patient and the patient's family regarding ongoing management from this point moving forward.  I think the patient needs to curb her alcohol use.  I think she is a full neurologic  evaluation as an outpatient.  I think she needs to involve her primary care physician.  I have spoken with case management and we will provide home health PT, OT, RN, aide, social work.  I do not think she needs acute hospitalization at this time.  Family will remain in town for the next several days to help assist the patient in managing the additional outpatient workup that is necessary.  The patient herself does not seem very interested in assistance and does not believe she needs help.  She has a walker at home.  She is blind in her right eye which is making ambulating likely more difficult.    Final Clinical Impressions(s) / ED Diagnoses   Final diagnoses:  None    New Prescriptions New Prescriptions   No medications on file     Jola Schmidt, MD 09/16/16 1801

## 2016-09-16 NOTE — Discharge Instructions (Signed)
Please encourage a healthy diet and hydration with water.   Please follow up with the primary care physician and have her renal function rechecked in 1 week

## 2016-09-18 LAB — URINE CULTURE

## 2016-10-12 ENCOUNTER — Encounter: Payer: Self-pay | Admitting: Neurology

## 2016-10-12 ENCOUNTER — Ambulatory Visit (INDEPENDENT_AMBULATORY_CARE_PROVIDER_SITE_OTHER): Payer: Medicare Other | Admitting: Neurology

## 2016-10-12 VITALS — BP 94/63 | HR 74 | Ht 65.0 in | Wt 185.5 lb

## 2016-10-12 DIAGNOSIS — R202 Paresthesia of skin: Secondary | ICD-10-CM

## 2016-10-12 DIAGNOSIS — H349 Unspecified retinal vascular occlusion: Secondary | ICD-10-CM

## 2016-10-12 DIAGNOSIS — R42 Dizziness and giddiness: Secondary | ICD-10-CM

## 2016-10-12 DIAGNOSIS — R413 Other amnesia: Secondary | ICD-10-CM

## 2016-10-12 DIAGNOSIS — G3184 Mild cognitive impairment, so stated: Secondary | ICD-10-CM | POA: Insufficient documentation

## 2016-10-12 DIAGNOSIS — R269 Unspecified abnormalities of gait and mobility: Secondary | ICD-10-CM

## 2016-10-12 MED ORDER — FLUOXETINE HCL 40 MG PO CAPS: 40.0000 mg | ORAL_CAPSULE | Freq: Every day | ORAL | 11 refills | Status: DC

## 2016-10-12 MED ORDER — ASPIRIN EC 81 MG PO TBEC: 81.0000 mg | DELAYED_RELEASE_TABLET | Freq: Every day | ORAL | 4 refills | Status: DC

## 2016-10-12 NOTE — Progress Notes (Signed)
[name] DOB: 25-Oct-1940  Chief Complaint  Patient presents with  . Failure To Thrive    MMSE 26/30 -  6 animals. She is here with her cousin, Virginia Walters and friend, Virginia Walters.  She is here for generalized weakness, weight loss, gait difficulty, frequent falls and memory loss.  Marland Kitchen PCP    Haywood Pao, MD     HISTORICAL  Virginia Walters is a 76 years old right-handed female, is accompanied by her cousin Virginia Walters and friend Virginia Walters, seen in refer by her primary care physician Dr. Fransico Him Tisovec for evaluation of memory loss, failure to thrive, initial evaluation was on October 12 2016.  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, international aid job, she lived alone since her husband passed away in 11/23/2012, her only daughter is in [place], she has good social support, she has been drinking 12 oz of wines daily  She had right ankle fracture in 11/24/14, require surgery, but was able to walk independently after recovery, she tripped over an animal, had left ankle fracture, she has gradually developed lightheadedness in a standing position, has been using a walker since September 2017, also suffered chronic low back pain, radiating pain to right lower extremity, denies bowel and bladder incontinence, she denies bilateral feet paresthesia, she denies bilateral upper extremity paresthesia or weakness, she also complains of chronic neck pain, going down her spine, radiating pain to left shoulder and left arm.  Has her friend and cousin check on her almost daily basis,after her dog died in  09-25-2016,  She suffered some  Depression, lost her appetite since 2016/09/25, not eating regular meal, she sleeps well,  She also complains of worsening memory trouble,   She was noted to have gradual onset memory loss since November 24, 2015, Sometimes she  Double dose her on her medications, such as clonazepam, gabapentin, suffered significant side effect of the medications, sleeps all the time , she also become much less active, withdrawal.  She was taken to the emergency room on September 16 2016 for similar  complains, I reviewed laboratory evaluations, creatinine was elevated 1.68, BUN was 33, normal CBC, hemoglobin 14.1, alcohol level less than 5, mild elevated total bilirubin 1.3, indirect bilirubin 1.0, normal liver functional test otherwise, normal ammonia level 15, UDS was positive for benzodiazepine,  CT head without contrast on September 16 2016 showed no acute abnormality, mild atrophy, supratentorium small vessel disease,  MRI lumbar in December 2017 acute to subacute L4 anterior and right lateral superior endplate fracture, with no significant loss of L4 vertebral body height, edema in the right L3-4 facet, and right L3 lamina, subacute to acute right L1 superior endplate fracture, chronic appearance 12 compression fracture, anterior T12 endplate marrow edema, superimposed multilevel degenerative disc disease, severe spinal and right lateral recess stenosis at L4-5, moderate spinal stenosis at L3-4, moderate right neuroforaminal stenosis at L3-4, L4-5 level  REVIEW OF SYSTEMS: Full 14 system review of systems performed and notable only for  As above  ALLERGIES: Allergies  Allergen Reactions  . Oxycodone Itching    HOME MEDICATIONS: Current Outpatient Prescriptions  Medication Sig Dispense Refill  . B Complex-Biotin-FA (B-COMPLEX PO) Take 1 capsule by mouth daily.     . Calcium Carbonate-Vitamin D (CALCIUM + D PO) Take 1 capsule by mouth 2 (two) times daily.     . Cholecalciferol (VITAMIN D)  2000 UNITS CAPS Take 1 capsule by mouth daily.     . clonazePAM (KLONOPIN) 0.5 MG tablet Take 0.5 mg by mouth 2 (two) times daily as needed for anxiety.     . dorzolamide (TRUSOPT) 2 % ophthalmic solution 1 drop 2 (two) times daily.      . fish oil-omega-3 fatty acids 1000 MG capsule Take 2 g by mouth 2 (two) times daily.      Marland Kitchen FLUoxetine (PROZAC) 20 MG capsule Take 20 mg by mouth daily.    Marland Kitchen gabapentin (NEURONTIN) 300 MG capsule Take 300 mg by mouth at bedtime.    . hydrochlorothiazide (HYDRODIURIL) 25 MG tablet Take 1 tablet (25 mg total) by mouth daily. 30 tablet 11  . HYDROcodone-acetaminophen (NORCO/VICODIN) 5-325 MG per tablet Take 0.5-1 tablets by mouth every 8 (eight) hours as needed for moderate pain or severe pain. 10 tablet 0  . lisinopril (PRINIVIL,ZESTRIL) 40 MG tablet Take 40 mg by mouth daily.      Marland Kitchen MELATONIN PO Take 1 tablet by mouth at bedtime.    . Multiple Vitamin (MULTIVITAMIN) tablet Take 1 tablet by mouth daily.      . Multiple Vitamins-Minerals (HAIR/SKIN/NAILS) CAPS Take 1 capsule by mouth daily.     Marland Kitchen omeprazole (PRILOSEC) 20 MG capsule Take 20 mg by mouth daily.      . potassium chloride (KLOR-CON 10) 10 MEQ tablet Take 1 tablet (10 mEq total) by mouth daily. 30 tablet 11  . pravastatin (PRAVACHOL) 40 MG tablet Take 40 mg by mouth daily.      . TURMERIC PO Take 1 capsule by mouth daily.    . verapamil (COVERA HS) 240 MG (CO) 24 hr tablet Take 240 mg by mouth daily.     Marland Kitchen VITAMIN E PO Take 1 capsule by mouth 2 (two) times daily.      No current facility-administered medications for this visit.     PAST MEDICAL HISTORY: Past Medical History:  Diagnosis Date  . Dyslipidemia   . Frequent falls   . GERD (gastroesophageal reflux disease)   . HTN (hypertension)   . Memory loss   . PFO (patent foramen ovale)   . Retinal artery occlusion   . Weakness     PAST SURGICAL HISTORY: Past Surgical History:  Procedure Laterality Date  . ANKLE ARTHROSCOPY WITH OPEN  REDUCTION INTERNAL FIXATION (ORIF) Right   . APPENDECTOMY    . TUBAL LIGATION      FAMILY HISTORY: Family History  Problem Relation Age of Onset  . Esophageal cancer Father   . Dementia Mother     SOCIAL HISTORY:  Social History   Social History  . Marital status: Married    Spouse name: N/A  . Number of children: 1  . Years of education: HS   Occupational History  . Retired    Social History Main Topics  . Smoking status: Former Smoker    Quit date: 12/27/1980  . Smokeless tobacco: Never Used  . Alcohol use Yes     Comment: occassionally  . Drug use: No  . Sexual activity: Not Currently   Other Topics Concern  . Not on file   Social History Narrative   Lives at home alone.  Right-handed.   No daily use of caffeine.     PHYSICAL EXAM   Vitals:   10/12/16 0955  BP: 94/63  Walters: 74  Weight: 185 lb 8 oz (84.1 kg)  Height: 5\' 5"  (1.651 m)    Not recorded      Body mass index is 30.87 kg/m. blood pressure lying down 10 over 74, heart rate of 64, sitting 106/68, heart rate of 74, standing 105/ 70, heart rate of 81  PHYSICAL EXAMNIATION:  Gen: NAD, conversant, well nourised, obese, well groomed                     Cardiovascular: Regular rate rhythm, no peripheral edema, warm, nontender. Eyes: Conjunctivae clear without exudates or hemorrhage Neck: Supple, no carotid bruits. Pulmonary: Clear to auscultation bilaterally   NEUROLOGICAL EXAM:  MENTAL STATUS: depressed-looking elderly female Speech:    Speech is normal; fluent and spontaneous with normal comprehension.  Cognition: Mini-Mental Status Examination 26/30, animal naming 6     Orientation : she is not oriented to date, year     Recent and remote memory : she missed 1/3 recall     Normal Attention span and concentration     Language, naming, repeating,spontaneous speech: difficulty drawing picture.     Fund of knowledge   CRANIAL NERVES: CN II: right eye is blind with APD, left visual  acuity 20/25 CN III, IV, VI: extraocular movement are normal. No ptosis. CN V: Facial sensation is intact to pinprick in all 3 divisions bilaterally. Corneal responses are intact.  CN VII: Face is symmetric with normal eye closure and smile. CN VIII: Hearing is normal to rubbing fingers CN IX, X: Palate elevates symmetrically. Phonation is normal. CN XI: Head turning and shoulder shrug are intact CN XII: Tongue is midline with normal movements and no atrophy.  MOTOR: There is no pronator drift of out-stretched arms. Muscle bulk and tone are normal. Muscle strength is normal.  REFLEXES: Reflexes are 3  and symmetric at the biceps, triceps, knees, and  Absent ankles. Plantar responses are flexor.  SENSORY: Intact to light touch, pinprick, positional sensation and vibratory sensation are intact in fingers and toes.  COORDINATION: Rapid alternating movements and fine finger movements are intact. There is no dysmetria on finger-to-nose and heel-knee-shin.    GAIT/STANCE: She needs help to getup from seated position, cautious, mildly unsteady wide-based   DIAGNOSTIC DATA (LABS, IMAGING, TESTING) - I reviewed patient records, labs, notes, testing and imaging myself where available.   ASSESSMENT AND PLAN  Jorgia I Coots is a 76 y.o. female    mild cognitive impairment   Mini-Mental Status Examination 26/30   History of right retinal artery occlusion, right eye was blind  Vascular risk factor of aging, hypertension, hyperlipidemia,   Aspirin daily   She was noted to have mildly decreased blood pressure, complains of orthostatic dizziness, will stop hydrochlorothiazide   Gait abnormality   likely a combination of deconditioning, lumbar radiculopathy, peripheral neuropathy, need to rule out cervical spondylitic myelopathy   MRI of cervical, lumbar,laboratory evaluations   EMG nerve conduction study Worsening depression   Increase Prozac to 40 mg daily   Stop gabapentin, only  take clonazepam as needed   Marcial Pacas, M.D. Ph.D.  [facility] [address], [place],  [address] Ph: [phone] Fax: [phone]  CC: Haywood Pao, MD

## 2016-10-12 NOTE — Patient Instructions (Signed)
Start baby aspirin daily  Stop hydrochlorothiazide by pressure medication  Stop gabapentin, stop melatonin, only take clonazepam 0.5 milligram as needed for anxiety, insomnia,  Increase Prozac from 20 mg to 40 mg daily, a new prescription of 40 mg was called into her pharmacy

## 2016-10-17 LAB — CK: Total CK: 32 U/L (ref 24–173)

## 2016-10-17 LAB — THYROID PANEL WITH TSH
Free Thyroxine Index: 1.7 (ref 1.2–4.9)
T3 Uptake Ratio: 28 % (ref 24–39)
T4, Total: 6.1 ug/dL (ref 4.5–12.0)
TSH: 1.42 u[IU]/mL (ref 0.450–4.500)

## 2016-10-17 LAB — SEDIMENTATION RATE: Sed Rate: 7 mm/hr (ref 0–40)

## 2016-10-17 LAB — FOLATE: Folate: 7.1 ng/mL (ref >3.0)

## 2016-10-17 LAB — MULTIPLE MYELOMA PANEL, SERUM
Albumin SerPl Elph-Mcnc: 3.5 g/dL (ref 2.9–4.4)
Albumin/Glob SerPl: 1.1 (ref 0.7–1.7)
Alpha 1: 0.3 g/dL (ref 0.0–0.4)
Alpha2 Glob SerPl Elph-Mcnc: 0.6 g/dL (ref 0.4–1.0)
B-Globulin SerPl Elph-Mcnc: 1.3 g/dL (ref 0.7–1.3)
Gamma Glob SerPl Elph-Mcnc: 1 g/dL (ref 0.4–1.8)
Globulin, Total: 3.2 g/dL (ref 2.2–3.9)
IgA/Immunoglobulin A, Serum: 302 mg/dL (ref 64–422)
IgG (Immunoglobin G), Serum: 982 mg/dL (ref 700–1600)
IgM (Immunoglobulin M), Srm: 224 mg/dL — ABNORMAL HIGH (ref 26–217)
Total Protein: 6.7 g/dL (ref 6.0–8.5)

## 2016-10-17 LAB — VITAMIN D 25 HYDROXY (VIT D DEFICIENCY, FRACTURES): Vit D, 25-Hydroxy: 62.1 ng/mL (ref 30.0–100.0)

## 2016-10-17 LAB — C-REACTIVE PROTEIN: CRP: 2.2 mg/L (ref 0.0–4.9)

## 2016-10-17 LAB — ANA W/REFLEX IF POSITIVE: Anti Nuclear Antibody(ANA): NEGATIVE

## 2016-10-17 LAB — VITAMIN B12: Vitamin B-12: 446 pg/mL (ref 232–1245)

## 2016-10-17 LAB — RPR: RPR Ser Ql: NONREACTIVE

## 2016-10-17 LAB — HGB A1C W/O EAG: Hgb A1c MFr Bld: 4.9 % (ref 4.8–5.6)

## 2016-10-25 ENCOUNTER — Emergency Department: Payer: Medicare Other

## 2016-10-25 ENCOUNTER — Emergency Department
Admission: EM | Admit: 2016-10-25 | Discharge: 2016-10-28 | Payer: Medicare Other | Attending: Emergency Medicine | Admitting: Emergency Medicine

## 2016-10-25 ENCOUNTER — Encounter: Payer: Self-pay | Admitting: Emergency Medicine

## 2016-10-25 DIAGNOSIS — G301 Alzheimer's disease with late onset: Secondary | ICD-10-CM | POA: Diagnosis not present

## 2016-10-25 DIAGNOSIS — Y939 Activity, unspecified: Secondary | ICD-10-CM | POA: Diagnosis not present

## 2016-10-25 DIAGNOSIS — Y999 Unspecified external cause status: Secondary | ICD-10-CM | POA: Diagnosis not present

## 2016-10-25 DIAGNOSIS — Y92009 Unspecified place in unspecified non-institutional (private) residence as the place of occurrence of the external cause: Secondary | ICD-10-CM | POA: Diagnosis not present

## 2016-10-25 DIAGNOSIS — M21331 Wrist drop, right wrist: Secondary | ICD-10-CM | POA: Insufficient documentation

## 2016-10-25 DIAGNOSIS — Z87891 Personal history of nicotine dependence: Secondary | ICD-10-CM | POA: Diagnosis not present

## 2016-10-25 DIAGNOSIS — Z7982 Long term (current) use of aspirin: Secondary | ICD-10-CM | POA: Diagnosis not present

## 2016-10-25 DIAGNOSIS — W06XXXA Fall from bed, initial encounter: Secondary | ICD-10-CM | POA: Diagnosis not present

## 2016-10-25 DIAGNOSIS — Z79899 Other long term (current) drug therapy: Secondary | ICD-10-CM | POA: Insufficient documentation

## 2016-10-25 DIAGNOSIS — R531 Weakness: Secondary | ICD-10-CM

## 2016-10-25 DIAGNOSIS — R440 Auditory hallucinations: Secondary | ICD-10-CM | POA: Diagnosis not present

## 2016-10-25 DIAGNOSIS — R8299 Other abnormal findings in urine: Secondary | ICD-10-CM | POA: Insufficient documentation

## 2016-10-25 DIAGNOSIS — S0083XA Contusion of other part of head, initial encounter: Secondary | ICD-10-CM | POA: Diagnosis not present

## 2016-10-25 DIAGNOSIS — I10 Essential (primary) hypertension: Secondary | ICD-10-CM | POA: Diagnosis present

## 2016-10-25 DIAGNOSIS — R296 Repeated falls: Secondary | ICD-10-CM | POA: Insufficient documentation

## 2016-10-25 DIAGNOSIS — F0281 Dementia in other diseases classified elsewhere with behavioral disturbance: Secondary | ICD-10-CM | POA: Diagnosis not present

## 2016-10-25 DIAGNOSIS — F1090 Alcohol use, unspecified, uncomplicated: Secondary | ICD-10-CM

## 2016-10-25 DIAGNOSIS — R443 Hallucinations, unspecified: Secondary | ICD-10-CM

## 2016-10-25 DIAGNOSIS — F0391 Unspecified dementia with behavioral disturbance: Secondary | ICD-10-CM

## 2016-10-25 DIAGNOSIS — R4182 Altered mental status, unspecified: Secondary | ICD-10-CM | POA: Diagnosis not present

## 2016-10-25 DIAGNOSIS — F03918 Unspecified dementia, unspecified severity, with other behavioral disturbance: Secondary | ICD-10-CM

## 2016-10-25 DIAGNOSIS — F1010 Alcohol abuse, uncomplicated: Secondary | ICD-10-CM

## 2016-10-25 DIAGNOSIS — S0993XA Unspecified injury of face, initial encounter: Secondary | ICD-10-CM | POA: Diagnosis present

## 2016-10-25 LAB — URINE DRUG SCREEN, QUALITATIVE (ARMC ONLY)
Amphetamines, Ur Screen: NOT DETECTED
Barbiturates, Ur Screen: NOT DETECTED
Benzodiazepine, Ur Scrn: NOT DETECTED
Cannabinoid 50 Ng, Ur ~~LOC~~: NOT DETECTED
Cocaine Metabolite,Ur ~~LOC~~: NOT DETECTED
MDMA (Ecstasy)Ur Screen: NOT DETECTED
Methadone Scn, Ur: NOT DETECTED
Opiate, Ur Screen: NOT DETECTED
Phencyclidine (PCP) Ur S: NOT DETECTED
Tricyclic, Ur Screen: NOT DETECTED

## 2016-10-25 LAB — COMPREHENSIVE METABOLIC PANEL
ALT: 21 U/L (ref 14–54)
AST: 31 U/L (ref 15–41)
Albumin: 4.3 g/dL (ref 3.5–5.0)
Alkaline Phosphatase: 78 U/L (ref 38–126)
Anion gap: 13 (ref 5–15)
BUN: 24 mg/dL — ABNORMAL HIGH (ref 6–20)
CO2: 22 mmol/L (ref 22–32)
Calcium: 9.8 mg/dL (ref 8.9–10.3)
Chloride: 99 mmol/L — ABNORMAL LOW (ref 101–111)
Creatinine, Ser: 1.17 mg/dL — ABNORMAL HIGH (ref 0.44–1.00)
GFR calc Af Amer: 51 mL/min — ABNORMAL LOW (ref >60)
GFR calc non Af Amer: 44 mL/min — ABNORMAL LOW (ref >60)
Glucose, Bld: 97 mg/dL (ref 65–99)
Potassium: 3.8 mmol/L (ref 3.5–5.1)
Sodium: 134 mmol/L — ABNORMAL LOW (ref 135–145)
Total Bilirubin: 1.2 mg/dL (ref 0.3–1.2)
Total Protein: 7.6 g/dL (ref 6.5–8.1)

## 2016-10-25 LAB — TSH: TSH: 1.718 u[IU]/mL (ref 0.350–4.500)

## 2016-10-25 LAB — CBC WITH DIFFERENTIAL/PLATELET
Basophils Absolute: 0 10*3/uL (ref 0–0.1)
Basophils Relative: 1 %
Eosinophils Absolute: 0.1 10*3/uL (ref 0–0.7)
Eosinophils Relative: 1 %
HCT: 42.6 % (ref 35.0–47.0)
Hemoglobin: 14.9 g/dL (ref 12.0–16.0)
Lymphocytes Relative: 16 %
Lymphs Abs: 1.3 10*3/uL (ref 1.0–3.6)
MCH: 33.2 pg (ref 26.0–34.0)
MCHC: 34.9 g/dL (ref 32.0–36.0)
MCV: 95.1 fL (ref 80.0–100.0)
Monocytes Absolute: 1 10*3/uL — ABNORMAL HIGH (ref 0.2–0.9)
Monocytes Relative: 12 %
Neutro Abs: 5.7 10*3/uL (ref 1.4–6.5)
Neutrophils Relative %: 70 %
Platelets: 220 10*3/uL (ref 150–440)
RBC: 4.48 MIL/uL (ref 3.80–5.20)
RDW: 12.2 % (ref 11.5–14.5)
WBC: 8.1 10*3/uL (ref 3.6–11.0)

## 2016-10-25 LAB — URINALYSIS, COMPLETE (UACMP) WITH MICROSCOPIC
Bacteria, UA: NONE SEEN
Bilirubin Urine: NEGATIVE
Glucose, UA: NEGATIVE mg/dL
Hgb urine dipstick: NEGATIVE
Ketones, ur: 5 mg/dL — AB
Nitrite: NEGATIVE
Protein, ur: NEGATIVE mg/dL
Specific Gravity, Urine: 1.017 (ref 1.005–1.030)
pH: 5 (ref 5.0–8.0)

## 2016-10-25 LAB — TROPONIN I: Troponin I: <0.03 ng/mL (ref <0.03)

## 2016-10-25 LAB — SALICYLATE LEVEL: Salicylate Lvl: <7 mg/dL (ref 2.8–30.0)

## 2016-10-25 LAB — ETHANOL: Alcohol, Ethyl (B): <5 mg/dL (ref <5)

## 2016-10-25 LAB — AMMONIA: Ammonia: 12 umol/L (ref 9–35)

## 2016-10-25 LAB — CK: Total CK: 160 U/L (ref 38–234)

## 2016-10-25 LAB — ACETAMINOPHEN LEVEL: Acetaminophen (Tylenol), Serum: <10 ug/mL — ABNORMAL LOW (ref 10–30)

## 2016-10-25 MED ORDER — THIAMINE HCL 100 MG/ML IJ SOLN
100.0000 mg | Freq: Every day | INTRAMUSCULAR | Status: DC
  Administered 2016-10-26: 100 mg via INTRAVENOUS
  Filled 2016-10-25: qty 2

## 2016-10-25 MED ORDER — VITAMIN B-1 100 MG PO TABS
100.0000 mg | ORAL_TABLET | Freq: Every day | ORAL | Status: DC
  Administered 2016-10-27: 100 mg via ORAL
  Filled 2016-10-25: qty 1

## 2016-10-25 MED ORDER — LORAZEPAM 2 MG/ML IJ SOLN: 0.0000 mg | Freq: Two times a day (BID) | INTRAMUSCULAR | Status: DC

## 2016-10-25 MED ORDER — LORAZEPAM 2 MG/ML IJ SOLN: 0.0000 mg | Freq: Four times a day (QID) | INTRAMUSCULAR | Status: AC

## 2016-10-25 NOTE — ED Notes (Signed)
Pt's relative [facility]) took pt's purse home at this time.

## 2016-10-25 NOTE — ED Notes (Signed)
X-ray at bedside

## 2016-10-25 NOTE — ED Triage Notes (Signed)
Pt to ed with c/o fall out of bed last night after having 2 glasses of wine.  Pt states she does not remember the fall and was disoriented for over 1 hour.  Pt alert at this time, pain and swelling to right wrist and left eye pain and swelling and redness.  Pt oriented to self, place, but not to time,  Pt states it is 1980.

## 2016-10-25 NOTE — ED Notes (Signed)
PT IVC PENDING CONSULT  

## 2016-10-25 NOTE — ED Notes (Signed)
Patients cousin Edgardo Roys please call when patient is moved to new room. [phone] work [phone] home [phone] cell

## 2016-10-25 NOTE — BH Assessment (Signed)
Assessment Note  Virginia Walters is an 76 y.o. female. Virginia Walters arrived to the ED by way of EMS.  Virginia Walters stated that she fell out of bed last night, and arrived today, because she had to wait until the ambulance arrived.  Virginia Walters reports that her husband passed away about 4 years ago and she resides alone.  She denied symptoms of depression or anxiety.  She denied having auditory or visual hallucinations.  She denied suicidal or homicidal ideation or intent.  She denied being under any additional stressors.  She denied the use of alcohol or drugs.  Virginia Walters later reported that she has people living in her home.  She states that she never sees them, but she hears them.  She shared that she hears them when they argue with each other.  She reports that this has been going on for about 2 week.  She shared that they are whispering when they argue.  Virginia Walters stated [employer] (her friend) rented out the 2 rooms in her home to these people, but denies it when she asks her about it.  Diagnosis: Auditory hallucinations  Past Medical History:  Past Medical History:  Diagnosis Date  . Dyslipidemia   . Frequent falls   . GERD (gastroesophageal reflux disease)   . HTN (hypertension)   . Memory loss   . PFO (patent foramen ovale)   . Retinal artery occlusion   . Weakness     Past Surgical History:  Procedure Laterality Date  . ANKLE ARTHROSCOPY WITH OPEN REDUCTION INTERNAL FIXATION (ORIF) Right   . APPENDECTOMY    . TUBAL LIGATION      Family History:  Family History  Problem Relation Age of Onset  . Esophageal cancer Father   . Dementia Mother     Social History:  reports that she quit smoking about 35 years ago. She has never used smokeless tobacco. She reports that she drinks alcohol. She reports that she does not use drugs.  Additional Social History:  Alcohol / Drug Use History of alcohol / drug use?: No history of alcohol / drug abuse  CIWA: CIWA-Ar BP: 129/62 Pulse  Rate: 71 Nausea and Vomiting: no nausea and no vomiting Tactile Disturbances: none Tremor: no tremor Auditory Disturbances: not present Paroxysmal Sweats: no sweat visible Visual Disturbances: not present Anxiety: no anxiety, at ease Headache, Fullness in Head: none present Agitation: normal activity Orientation and Clouding of Sensorium: cannot do serial additions or is uncertain about date CIWA-Ar Total: 1 COWS:    Allergies:  Allergies  Allergen Reactions  . Oxycodone Itching    Home Medications:  (Not in a hospital admission)  OB/GYN Status:  No LMP recorded. Patient is postmenopausal.  General Assessment Data Location of Assessment: [facility] ED TTS Assessment: In system Is this a Tele or Face-to-Face Assessment?: Face-to-Face Is this an Initial Assessment or a Re-assessment for this encounter?: Initial Assessment Marital status: Widowed Elwin Sleight name: Virginia Walters Is patient pregnant?: No Pregnancy Status: No Living Arrangements: Alone Can pt return to current living arrangement?: Yes Admission Status: Voluntary Is patient capable of signing voluntary admission?: Yes Referral Source: Self/Family/Friend Insurance type: [place] Screening Exam ([place]) Medical Exam completed: Yes  Crisis Care Plan Living Arrangements: Alone Legal Guardian: Other: (Self) Name of Psychiatrist: None at this time Name of Therapist: None at this time  Education Status Is patient currently in school?: No Current Grade: n/a Highest grade of school patient has completed: 12th  Name of school: [employer] in [place] person: n/a  Risk to self with the past 6 months Suicidal Ideation: No Has patient been a risk to self within the past 6 months prior to admission? : No Suicidal Intent: No Has patient had any suicidal intent within the past 6 months prior to admission? : No Is patient at risk for suicide?: No Suicidal Plan?: No Has patient had any  suicidal plan within the past 6 months prior to admission? : No Access to Means: No What has been your use of drugs/alcohol within the last 12 months?: Denied use of drugs, occassional use of alcohol Previous Attempts/Gestures: No How many times?: 0 Other Self Harm Risks: denied Triggers for Past Attempts: None known Intentional Self Injurious Behavior: None Family Suicide History: No Recent stressful life event(s):  (None reported) Persecutory voices/beliefs?: No Depression: No Depression Symptoms:  (denied) Substance abuse history and/or treatment for substance abuse?: No Suicide prevention information given to non-admitted patients: Not applicable  Risk to Others within the past 6 months Homicidal Ideation: No Does patient have any lifetime risk of violence toward others beyond the six months prior to admission? : No Thoughts of Harm to Others: No Current Homicidal Intent: No Current Homicidal Plan: No Access to Homicidal Means: No Identified Victim: None identified History of harm to others?: No Assessment of Violence: None Noted Violent Behavior Description: denied Does patient have access to weapons?: No Criminal Charges Pending?: No Does patient have a court date: No Is patient on probation?: No  Psychosis Hallucinations: Auditory Delusions: None noted  Mental Status Report Appearance/Hygiene: In hospital gown Eye Contact: Fair Motor Activity: Unremarkable Speech: Soft Level of Consciousness: Alert Mood: Pleasant Affect: Appropriate to circumstance Anxiety Level: Minimal Thought Processes: Unable to Assess Judgement: Unimpaired Orientation: Person, Place, Time, Situation Obsessive Compulsive Thoughts/Behaviors: None  Cognitive Functioning Concentration: Normal Memory: Recent Intact IQ: Average Insight: Fair Impulse Control: Fair Appetite: Good Sleep: No Change Vegetative Symptoms: None  ADLScreening [facility] Assessment Services) Patient's cognitive  ability adequate to safely complete daily activities?: Yes Patient able to express need for assistance with ADLs?: Yes Independently performs ADLs?: Yes (appropriate for developmental age)  Prior Inpatient Therapy Prior Inpatient Therapy: No Prior Therapy Dates: n/a Prior Therapy Facilty/Provider(s): n/a Reason for Treatment: n/a  Prior Outpatient Therapy Prior Outpatient Therapy: No Prior Therapy Dates: n/a Prior Therapy Facilty/Provider(s): n/a Reason for Treatment: n/a Does patient have an ACCT team?: No Does patient have Intensive In-House Services?  : No Does patient have [facility] services? : No Does patient have P4CC services?: No  ADL Screening (condition at time of admission) Patient's cognitive ability adequate to safely complete daily activities?: Yes Patient able to express need for assistance with ADLs?: Yes Independently performs ADLs?: Yes (appropriate for developmental age)       Abuse/Neglect Assessment (Assessment to be complete while patient is alone) Physical Abuse: Denies Verbal Abuse: Denies Sexual Abuse: Denies Exploitation of patient/patient's resources: Denies Self-Neglect: Denies     Regulatory affairs officer (For Healthcare) Does Patient Have a Medical Advance Directive?: Yes Does patient want to make changes to medical advance directive?: No - Patient declined Type of Advance Directive: [place], Living will Copy of Uncertain in Chart?: No - copy requested Copy of Living Will in Chart?: No - copy requested Would patient like information on creating a medical advance directive?: No - Patient declined    Additional Information 1:1 In Past 12 Months?: No CIRT Risk: No Elopement Risk:  No Does patient have medical clearance?: Yes     Disposition:  Disposition Initial Assessment Completed for this Encounter: Yes Disposition of Patient: Other dispositions  On Site Evaluation by:   Reviewed with Physician:     Elmer Bales 10/25/2016 10:51 PM

## 2016-10-25 NOTE — ED Notes (Signed)
Pt to MRI with Myra, NT

## 2016-10-25 NOTE — ED Notes (Signed)
Pt had a fall at home at recently. Pt is stating that she fell last night at home after drinking a couple glasses of wine. Pt has told us two different stories. One being that she stayed on the floor all night, another being that she was there one hour. Pt has bruising to nose and underneath both eyes. Pt stating pain in her right wrist and is unable to raise right hand. Pt has an abrasion noted to right wrist. Pt has been placed on cardiac monitor. Pt is able to state her name and birthday but is not answering questions with clear answers. Pt following commands.

## 2016-10-25 NOTE — ED Provider Notes (Addendum)
[facility] Emergency Department Provider Note  ____________________________________________   None    (approximate)  I have reviewed the triage vital signs and the nursing notes.   HISTORY  Chief Complaint Fall   HPI Virginia Walters is a 76 y.o. female with a history of alcoholism and frequent falls who is presenting to the emergency department today after a fall with confusion. The patient says that she rolled out of bed last night and fell onto the front of her face. She says that she was unable to get back up in the bed, but then fell again this morning and was unable to get up and so the ambulance was called. The patient says that she had 2 glasses of wine yesterday but denies drinking today. She denies drinking daily. Says that she is a headache which is frontal this time but otherwise denies any pain.  I also discussed case with the patient's daughter, Ms. Zenia Resides, who says that the patient is an alcoholic and drinks daily. The daughter is also saying the patient has had hallucinations over the past several months and that her general mental status has been in decline. She says the patient also says that there are several people living with her that or not.   Past Medical History:  Diagnosis Date  . Dyslipidemia   . Frequent falls   . GERD (gastroesophageal reflux disease)   . HTN (hypertension)   . Memory loss   . PFO (patent foramen ovale)   . Retinal artery occlusion   . Weakness     Patient Active Problem List   Diagnosis Date Noted  . Memory loss 10/12/2016  . Gait abnormality 10/12/2016  . Orthostatic dizziness 10/12/2016  . Paresthesia 10/12/2016  . Abnormal EKG 12/28/2010  . HTN (hypertension)   . Dyslipidemia   . PFO (patent foramen ovale)   . Retinal artery occlusion   . GERD (gastroesophageal reflux disease)     Past Surgical History:  Procedure Laterality Date  . ANKLE ARTHROSCOPY WITH OPEN REDUCTION INTERNAL FIXATION  (ORIF) Right   . APPENDECTOMY    . TUBAL LIGATION      Prior to Admission medications   Medication Sig Start Date End Date Taking? Authorizing Provider  aspirin EC 81 MG tablet Take 1 tablet (81 mg total) by mouth daily. 10/12/16  Yes Marcial Pacas, MD  B Complex-Biotin-FA (B-COMPLEX PO) Take 1 capsule by mouth daily.    Yes Historical Provider, MD  Calcium Carbonate-Vitamin D (CALCIUM + D PO) Take 1 capsule by mouth 2 (two) times daily.    Yes Historical Provider, MD  Cholecalciferol (VITAMIN D) 2000 UNITS CAPS Take 1 capsule by mouth daily.    Yes Historical Provider, MD  clonazePAM (KLONOPIN) 0.5 MG tablet Take 0.5 mg by mouth 2 (two) times daily as needed for anxiety.    Yes Historical Provider, MD  dorzolamide (TRUSOPT) 2 % ophthalmic solution 1 drop 2 (two) times daily.     Yes Historical Provider, MD  fish oil-omega-3 fatty acids 1000 MG capsule Take 2 g by mouth 2 (two) times daily.     Yes Historical Provider, MD  FLUoxetine (PROZAC) 40 MG capsule Take 1 capsule (40 mg total) by mouth daily. 10/12/16  Yes Marcial Pacas, MD  lisinopril (PRINIVIL,ZESTRIL) 40 MG tablet Take 40 mg by mouth daily.     Yes Historical Provider, MD  MELATONIN PO Take 5 mg by mouth at bedtime.    Yes Historical Provider, MD  Multiple Vitamin (MULTIVITAMIN) tablet Take 1 tablet by mouth daily.     Yes Historical Provider, MD  Multiple Vitamins-Minerals (HAIR/SKIN/NAILS) CAPS Take 1 capsule by mouth daily.    Yes Historical Provider, MD  omeprazole (PRILOSEC) 20 MG capsule Take 20 mg by mouth daily.     Yes Historical Provider, MD  potassium chloride (KLOR-CON 10) 10 MEQ tablet Take 1 tablet (10 mEq total) by mouth daily. 08/20/14  Yes Thayer Headings, MD  pravastatin (PRAVACHOL) 40 MG tablet Take 40 mg by mouth daily.     Yes Historical Provider, MD  TURMERIC PO Take 1 capsule by mouth daily.   Yes Historical Provider, MD  verapamil (COVERA HS) 240 MG (CO) 24 hr tablet Take 240 mg by mouth daily.    Yes Historical  Provider, MD  VITAMIN E PO Take 1 capsule by mouth 2 (two) times daily.    Yes Historical Provider, MD  HYDROcodone-acetaminophen (NORCO/VICODIN) 5-325 MG per tablet Take 0.5-1 tablets by mouth every 8 (eight) hours as needed for moderate pain or severe pain. Patient not taking: Reported on 10/25/2016 12/30/14   Marylene Land, NP    Allergies Oxycodone  Family History  Problem Relation Age of Onset  . Esophageal cancer Father   . Dementia Mother     Social History Social History  Substance Use Topics  . Smoking status: Former Smoker    Quit date: 12/27/1980  . Smokeless tobacco: Never Used  . Alcohol use Yes     Comment: occassionally    Review of Systems Constitutional: No fever/chills Eyes: No visual changes. ENT: No sore throat. Cardiovascular: Denies chest pain. Respiratory: Denies shortness of breath. Gastrointestinal: No abdominal pain.  No nausea, no vomiting.  No diarrhea.  No constipation. Genitourinary: Negative for dysuria. Musculoskeletal: Negative for back pain. Skin: Negative for rash. Neurological: Negative for focal weakness or numbness.  10-point ROS otherwise negative.  ____________________________________________   PHYSICAL EXAM:  VITAL SIGNS: ED Triage Vitals  Enc Vitals Group     BP 10/25/16 1502 138/76     Pulse Rate 10/25/16 1502 91     Resp 10/25/16 1502 18     Temp 10/25/16 1502 98.2 F (36.8 C)     Temp Source 10/25/16 1502 Oral     SpO2 10/25/16 1502 96 %     Weight 10/25/16 1503 185 lb (83.9 kg)     Height 10/25/16 1503 5\' 7"  (1.702 m)     Head Circumference --      Peak Flow --      Pain Score 10/25/16 1503 5     Pain Loc --      Pain Edu? --      Excl. in [place]? --     Constitutional: Alert and oriented.  in no acute distress. Eyes: Conjunctivae are normal. PERRL. EOMI. Head: Multiple stages of ecchymosis to the front face and below the eyes bilaterally. Nose: No congestion/rhinnorhea. Mouth/Throat: Mucous membranes are  moist.  Oropharynx non-erythematous. Neck: No stridor.  No tenderness to palpation of the midline cervical spine. Patient ranges the head and neck freely. Cardiovascular: Normal rate, regular rhythm. Grossly normal heart sounds.  Good peripheral circulation. Respiratory: Normal respiratory effort.  No retractions. Lungs CTAB. Gastrointestinal: Soft and nontender. No distention. No abdominal bruits. No CVA tenderness. Musculoskeletal: No lower extremity tenderness nor edema.  No joint effusions. Neurologic:  Normal speech and language. Possible mild right-sided facial droop when smiling only. Normal speech. Right sided wrist drop. Patient says that  that is not her baseline and the daughter  corroborates this. Skin:  Skin is warm, dry and intact. No rash noted. Psychiatric: Mood and affect are normal. Speech and behavior are normal.  ____________________________________________   LABS (all labs ordered are listed, but only abnormal results are displayed)  Labs Reviewed  CBC WITH DIFFERENTIAL/PLATELET - Abnormal; Notable for the following:       Result Value   Monocytes Absolute 1.0 (*)    All other components within normal limits  COMPREHENSIVE METABOLIC PANEL - Abnormal; Notable for the following:    Sodium 134 (*)    Chloride 99 (*)    BUN 24 (*)    Creatinine, Ser 1.17 (*)    GFR calc non Af Amer 44 (*)    GFR calc Af Amer 51 (*)    All other components within normal limits  URINALYSIS, COMPLETE (UACMP) WITH MICROSCOPIC - Abnormal; Notable for the following:    Color, Urine YELLOW (*)    APPearance CLEAR (*)    Ketones, ur 5 (*)    Leukocytes, UA MODERATE (*)    Squamous Epithelial / LPF 0-5 (*)    All other components within normal limits  ACETAMINOPHEN LEVEL - Abnormal; Notable for the following:    Acetaminophen (Tylenol), Serum <10 (*)    All other components within normal limits  ETHANOL  TROPONIN I  AMMONIA  TSH  CK  SALICYLATE LEVEL  URINE DRUG SCREEN, QUALITATIVE  ([facility] ONLY)   ____________________________________________  EKG  ED ECG REPORT I, Doran Stabler, the attending physician, personally viewed and interpreted this ECG.   Date: 10/25/2016  EKG Time: 1616  Rate: 74  Rhythm: normal sinus rhythm  Axis: Normal  Intervals: Normal  ST&T Change: No ST segment elevation or depression. T-wave inversions in aVL as well as V2.  ____________________________________________  RADIOLOGY  DG Chest 1 View (Accession [id number]) (Order [id number])  Imaging  Date: 10/25/2016 Department: [facility] EMERGENCY DEPARTMENT Released By/Authorizing: Orbie Pyo, MD (auto-released)  Exam Information   Status Exam Begun  Exam Ended   Final [99] 10/25/2016 4:18 PM 10/25/2016 4:40 PM  PACS Images   Show images for DG Chest 1 View  Study Result   CLINICAL DATA:  Fall at home  EXAM: CHEST 1 VIEW  COMPARISON:  None.  FINDINGS: AC joint degenerative changes left greater than right. Mildly low lung volumes. No acute infiltrate or effusion. Cardiomediastinal silhouette is within normal limits. Focal convex opacity left lower mediastinum likely relates to tortuous aorta. There is no pneumothorax.  IMPRESSION: Low lung volumes. No radiographic evidence for acute cardiopulmonary abnormality.   Electronically Signed   By: Donavan Foil M.D.   On: 10/25/2016 16:47   DG Pelvis 1-2 Views (Accession [id number]) (Order [id number])  Imaging  Date: 10/25/2016 Department: [facility] EMERGENCY DEPARTMENT Released By/Authorizing: Orbie Pyo, MD (auto-released)  Exam Information   Status Exam Begun  Exam Ended   Final [99] 10/25/2016 4:18 PM 10/25/2016 4:40 PM  PACS Images   Show images for DG Pelvis 1-2 Views  Study Result   CLINICAL DATA:  Fall  EXAM: PELVIS - 1-2 VIEW  COMPARISON:  None.  FINDINGS: Degenerative changes of the lower lumbar spine. SI  joint degenerative changes left greater than right. No fracture or dislocation. Pubic symphysis appears intact.  IMPRESSION: No acute osseous abnormality.   Electronically Signed   By: Donavan Foil M.D.   On: 10/25/2016 16:49   DG  Wrist Complete Right (Accession [id number]) (Order [id number])  Imaging  Date: 10/25/2016 Department: [facility] EMERGENCY DEPARTMENT Released By/Authorizing: Orbie Pyo, MD (auto-released)  Exam Information   Status Exam Begun  Exam Ended   Final [99] 10/25/2016 4:18 PM 10/25/2016 4:40 PM  PACS Images   Show images for DG Wrist Complete Right  Study Result   CLINICAL DATA:  Fall with pain  EXAM: RIGHT WRIST - COMPLETE 3+ VIEW  COMPARISON:  None.  FINDINGS: Mild degenerative changes at the first [facility] joint. No fracture or malalignment.  IMPRESSION: No acute osseous abnormality   Electronically Signed   By: Donavan Foil M.D.   On: 10/25/2016 16:48     ____________________________________________   PROCEDURES  Procedure(s) performed:   Procedures  Critical Care performed:   ____________________________________________   INITIAL IMPRESSION / ASSESSMENT AND PLAN / ED COURSE  Pertinent labs & imaging results that were available during my care of the patient were reviewed by me and considered in my medical decision making (see chart for details).  ----------------------------------------- 6:02 PM on 10/25/2016 -----------------------------------------  Patient with reassuring workup. Possible mild UTI. However, I'm more concerned about stroke and psychosis from likely worsening dementia and a history of closing. The patient was placed on CIWA protocol with vitamin supplementation. Urine culture will be sent. Patient will be admitted to the hospital. She is understanding of this and willing to comply. I also discussed with her putting her on an involuntary commitment because the  hallucinations reported by her daughter that there are other people living with her.  Patient is understanding the plan and willing to comply. Signed out to Dr. Ara Kussmaul. Unclear time of onset of the patient's mild right-sided facial droop and right wrist weakness. Not a TPA candidate. She'll be given aspirin. Possible right wrist drop, peripheral in origin.       ____________________________________________   FINAL CLINICAL IMPRESSION(S) / ED DIAGNOSES  Altered mental status. Right upper extremity weakness.    NEW MEDICATIONS STARTED DURING THIS VISIT:  New Prescriptions   No medications on file     Note:  This document was prepared using Dragon voice recognition software and may include unintentional dictation errors.    Orbie Pyo, MD 10/25/16 [phone]  I also called back the patient's daughter, Ms. Zenia Resides, and left a message on her voicemail as well as the hospital phone number so that I can update her as to the patient's current status.   Orbie Pyo, MD 10/25/16 [phone]  Patient with reassuring MRI results without any acute findings.  Placed in a right upper extremity volar splint and is neurovascularly intact. I updated the patient's daughter, Ms. Zenia Resides about the MRI results and the need for further evaluation psychiatry. We will hold on the admission at this time. Originally, the plan was to admit the patient were neurologic workup. However on discussion with Dr. Darvin Neighbours of the internal medicine service and was decided that we would do the MRI in the emergency department and negative for any acute process that she would be held on her evaluated for psychiatric causes of her complaints. Physical therapy consult was also ordered at this time for presumed placement of the patient.    Orbie Pyo, MD 10/25/16 [phone]

## 2016-10-25 NOTE — ED Notes (Signed)
X-ray is at bedside.

## 2016-10-25 NOTE — ED Notes (Signed)
ED Provider at bedside. 

## 2016-10-25 NOTE — ED Notes (Signed)
Remains awake and alert. Skin warm and dry. NAD

## 2016-10-26 DIAGNOSIS — G301 Alzheimer's disease with late onset: Secondary | ICD-10-CM

## 2016-10-26 DIAGNOSIS — F1010 Alcohol abuse, uncomplicated: Secondary | ICD-10-CM

## 2016-10-26 DIAGNOSIS — F1090 Alcohol use, unspecified, uncomplicated: Secondary | ICD-10-CM

## 2016-10-26 DIAGNOSIS — F0391 Unspecified dementia with behavioral disturbance: Secondary | ICD-10-CM

## 2016-10-26 DIAGNOSIS — F03918 Unspecified dementia, unspecified severity, with other behavioral disturbance: Secondary | ICD-10-CM

## 2016-10-26 DIAGNOSIS — F0281 Dementia in other diseases classified elsewhere with behavioral disturbance: Secondary | ICD-10-CM | POA: Diagnosis not present

## 2016-10-26 LAB — URINE CULTURE

## 2016-10-26 MED ORDER — LISINOPRIL 20 MG PO TABS
40.0000 mg | ORAL_TABLET | Freq: Every day | ORAL | Status: DC
  Administered 2016-10-27: 40 mg via ORAL
  Filled 2016-10-26 (×2): qty 2

## 2016-10-26 MED ORDER — CLONAZEPAM 0.5 MG PO TABS: 0.5000 mg | ORAL_TABLET | Freq: Two times a day (BID) | ORAL | Status: DC | PRN

## 2016-10-26 MED ORDER — DIPHENHYDRAMINE HCL 25 MG PO CAPS
50.0000 mg | ORAL_CAPSULE | Freq: Once | ORAL | Status: AC
  Administered 2016-10-26: 50 mg via ORAL
  Filled 2016-10-26: qty 2

## 2016-10-26 MED ORDER — ASPIRIN EC 81 MG PO TBEC
81.0000 mg | DELAYED_RELEASE_TABLET | Freq: Every day | ORAL | Status: DC
  Administered 2016-10-26 – 2016-10-27 (×2): 81 mg via ORAL
  Filled 2016-10-26 (×2): qty 1

## 2016-10-26 MED ORDER — FLUOXETINE HCL 20 MG PO CAPS
40.0000 mg | ORAL_CAPSULE | Freq: Every day | ORAL | Status: DC
Start: 2016-10-26 — End: 2016-10-28
  Administered 2016-10-26 – 2016-10-27 (×2): 40 mg via ORAL
  Filled 2016-10-26 (×2): qty 2

## 2016-10-26 MED ORDER — PANTOPRAZOLE SODIUM 40 MG PO TBEC
40.0000 mg | DELAYED_RELEASE_TABLET | Freq: Every day | ORAL | Status: DC
  Administered 2016-10-26 – 2016-10-27 (×2): 40 mg via ORAL
  Filled 2016-10-26 (×2): qty 1

## 2016-10-26 MED ORDER — CEPHALEXIN 500 MG PO CAPS
500.0000 mg | ORAL_CAPSULE | Freq: Three times a day (TID) | ORAL | Status: DC
  Administered 2016-10-26 – 2016-10-28 (×5): 500 mg via ORAL
  Filled 2016-10-26 (×5): qty 1

## 2016-10-26 NOTE — ED Notes (Signed)
Lunch tray ordered 

## 2016-10-26 NOTE — ED Notes (Signed)
NAD noted at this time. Pt resting in bed with eyes closed, respirations even and unlabored. Pt denies wanting lights turned off or TV turned on. Will continue to monitor for further patient needs.

## 2016-10-26 NOTE — ED Notes (Signed)
Pt repositioned in the bed, sitting up eating dinner at this time. NAD noted. Will continue to monitor for further patient needs.

## 2016-10-26 NOTE — ED Notes (Signed)
Pt visualized in NAD at this time. Denies any needs at this time. Will continue to monitor for further patient needs . Respirations even and unlabored, skin warm, dry, and intact.

## 2016-10-26 NOTE — ED Notes (Signed)
Patient still has IV in place.

## 2016-10-26 NOTE — ED Notes (Signed)
Pt repositioned in the bed, pt also had supervised 15 min visit at this time. Will continue to monitor for further patient needs.

## 2016-10-26 NOTE — ED Notes (Signed)
This tech and pt RN changed pt into wine colored scrubs. Pt belongings placed in one bag and labeled 1 of 1. Pt assisted to toilet and back to bed. Pt lying down and resting.

## 2016-10-26 NOTE — ED Notes (Signed)
PT with pt.

## 2016-10-26 NOTE — Clinical Social Work Note (Signed)
CSW consulted for nursing home placement. PT has assessed patient and recommending home health. Patient is under IVC currently and awaiting psychiatry to round and make recommendations. -Shela Leff MSW,LCSW [phone]

## 2016-10-26 NOTE — ED Notes (Signed)
Spoke with daughter Rosalyn Charters, wanted an update on mother. Daughter verified name, dob, social prior to discussing care. Gave daughter an update. Daughter was suppose to get a call from psychiatrist after her visit but did not. I will pass this along in report to day shift nurse. Also gave daughter password for mother.

## 2016-10-26 NOTE — Evaluation (Signed)
Physical Therapy Evaluation Patient Details Name: Virginia Walters MRN: [id number] DOB: Oct 22, 1940 Today's Date: 10/26/2016   History of Present Illness  Pt is a 76 y.o.femalewith a history of alcoholism and frequent falls who is presenting to the emergency department today after a fall with confusion. The patient says that she rolled out of bed last night and fell onto the front of her face. She says that she was unable to get back up in the bed, but then fell again this morning and was unable to get up and so the ambulance was called. The patient says that she had 2 glasses of wine 10/24/16 but denies drinking 10/25/16. She denies drinking daily. Says that she is a headache which is frontal this time but otherwise denies any pain. Pt's daughter reports that the patient is an alcoholic and drinks daily. The daughter is also saying the patient has had hallucinations over the past several months and that her general mental status has been in decline.    Clinical Impression  Pt presents with deficits in strength, transfers, mobility, gait, balance, and activity tolerance.  Light touch and proprioception grossly intact to BUEs and BLEs.  Gross motor control diminished slightly to BUEs but no difference L vs R.  Some deficits to peripheral vision noted to R eye compared to L.  Pt with general weakness to BLEs but more pronounced to the LLE per below.  Pt required extra time and effort for bed mob tasks but no physical assistance.  Pt able to stand from EOB with CGA.  Max standing time without UE assist 3-5 sec before pt requires Mod A to prevent posterior LOB.  Pt able to amb max of 30' with CGA and RW secondary to fatigue.  Pt is at an elevated risk for falls and would benefit from supervision with all OOB mobility.  Pt will benefit from PT services to address above deficits for decreased caregiver assistance upon discharge.        Follow Up Recommendations Home health PT;Supervision/Assistance - 24  hour;Supervision for mobility/OOB    Equipment Recommendations  None recommended by PT    Recommendations for Other Services       Precautions / Restrictions Precautions Precautions: Fall Restrictions Weight Bearing Restrictions: No Other Position/Activity Restrictions: Pt found with a volar splint donned to the RUE      Mobility  Bed Mobility Overal bed mobility: Needs Assistance Bed Mobility: Supine to Sit;Sit to Supine     Supine to sit: Supervision Sit to supine: Supervision   General bed mobility comments: Extra time and effort required during bed mobility tasks  Transfers Overall transfer level: Needs assistance Equipment used: Rolling walker (2 wheeled) Transfers: Sit to/from Stand Sit to Stand: Min guard         General transfer comment: Min verbal cues for proper sequencing during transfers but no LOB  Ambulation/Gait Ambulation/Gait assistance: Min guard Ambulation Distance (Feet): 30 Feet Assistive device: Rolling walker (2 wheeled) Gait Pattern/deviations: Decreased step length - left;Decreased step length - right;Trunk flexed   Gait velocity interpretation: <1.8 ft/sec, indicative of risk for recurrent falls General Gait Details: Very slow cadence and short B step length with amb but no LOB  Stairs            Wheelchair Mobility    Modified Rankin (Stroke Patients Only)       Balance Overall balance assessment: Needs assistance Sitting-balance support: No upper extremity supported Sitting balance-Leahy Scale: Good     Standing  balance support: No upper extremity supported Standing balance-Leahy Scale: Poor Standing balance comment: Max standing time 3-5 sec before Mod A required to prevent posterior LOB without UE support                              Pertinent Vitals/Pain Pain Assessment: 0-10 Pain Score: 0-No pain    Home Living Family/patient expects to be discharged to:: Private residence Living Arrangements:  Alone   Type of Home: House Home Access: Stairs to enter Entrance Stairs-Rails: Right;Left (Can not reach both) Entrance Stairs-Number of Steps: 6 Home Layout: One level Home Equipment: Walker - 2 wheels      Prior Function Level of Independence: Independent with assistive device(s)         Comments: Mod I with amb with RW, pt reports recent fall was out of bed with no other fall history     Hand Dominance   Dominant Hand: Right    Extremity/Trunk Assessment   Upper Extremity Assessment Upper Extremity Assessment:  (Gross motor coordination grossly equal L vs R but overall mildly impaired)    Lower Extremity Assessment Lower Extremity Assessment: Generalized weakness;LLE deficits/detail;RLE deficits/detail RLE Deficits / Details: Hip flex 3+/5, knee flex/ext 4+/5, DF 4+/5 LLE Deficits / Details: LLE hip flex 2-/5, knee flex/ext 4-/5, DF 4/5 LLE:  ( ) LLE Sensation:  (BLEs grossly intact to light touch and proprioception )       Communication   Communication: No difficulties  Cognition Arousal/Alertness: Lethargic Behavior During Therapy: Flat affect Overall Cognitive Status: No family/caregiver present to determine baseline cognitive functioning                 General Comments: Pt with flat affect but able to follow commands well    General Comments      Exercises Total Joint Exercises Ankle Circles/Pumps: AROM;Both;5 reps;10 reps Quad Sets: Strengthening;Both;5 reps;10 reps Heel Slides: AROM;Both;5 reps;10 reps (AAROM on LLE) Hip ABduction/ADduction: AROM;Both;5 reps;10 reps (AAROM on LLE) Straight Leg Raises: AROM;Both;5 reps;10 reps (AAROM on LLE) Long Arc Quad: Both;10 reps;Strengthening Knee Flexion: Strengthening;Both;10 reps   Assessment/Plan    PT Assessment Patient needs continued PT services  PT Problem List Decreased strength;Decreased activity tolerance;Decreased balance;Decreased mobility       PT Treatment Interventions DME  instruction;Gait training;Stair training;Functional mobility training;Neuromuscular re-education;Balance training;Therapeutic exercise;Therapeutic activities;Patient/family education    PT Goals (Current goals can be found in the Care Plan section)  Acute Rehab PT Goals Patient Stated Goal: To be stronger PT Goal Formulation: With patient Time For Goal Achievement: 11/08/16 Potential to Achieve Goals: Good    Frequency Min 2X/week   Barriers to discharge        Co-evaluation               End of Session Equipment Utilized During Treatment: Gait belt Activity Tolerance: Patient limited by fatigue Patient left: in bed;Other (comment) (Bed rails returned to up position) Nurse Communication: Mobility status PT Visit Diagnosis: Difficulty in walking, not elsewhere classified (R26.2);Muscle weakness (generalized) (M62.81)    Functional Assessment Tool Used: AM-PAC 6 Clicks Basic Mobility Functional Limitation: Mobility: Walking and moving around Mobility: Walking and Moving Around Current Status (K1601): At least 40 percent but less than 60 percent impaired, limited or restricted Mobility: Walking and Moving Around Goal Status [phone]): At least 1 percent but less than 20 percent impaired, limited or restricted    Time: 5573-2202 PT Time Calculation (min) (ACUTE  ONLY): 36 min   Charges:   PT Evaluation $PT Eval Low Complexity: 1 Procedure PT Treatments $Therapeutic Exercise: 8-22 mins   PT G Codes:   PT G-Codes **NOT FOR INPATIENT CLASS** Functional Assessment Tool Used: AM-PAC 6 Clicks Basic Mobility Functional Limitation: Mobility: Walking and moving around Mobility: Walking and Moving Around Current Status (A2130): At least 40 percent but less than 60 percent impaired, limited or restricted Mobility: Walking and Moving Around Goal Status [phone]): At least 1 percent but less than 20 percent impaired, limited or restricted     D. Scott Vivianna Piccini PT, DPT 10/26/16, 11:14  AM

## 2016-10-26 NOTE — ED Notes (Signed)
Pt  ivc  Pending  Consult  Pt  Seen  By  Research officer, political party

## 2016-10-26 NOTE — ED Notes (Signed)
Pt sat up in bed by this RN and given lunch tray. Pt states "oh this smells good!". Pt states to this RN that she is feeling better at this time. This RN will continue to monitor for further patient needs.

## 2016-10-26 NOTE — ED Notes (Signed)
This RN spoke with pt's Daughter Rosalyn Charters, states she has medical power of attorney, states gave copy to [place] approx 1 month ago, pt's daughter states she lives in [place] and is unable to come to the hospital. This RN was able to verify pt's name, birthday, social security and phone numbers on file prior to giving patient's daughter update.

## 2016-10-26 NOTE — ED Provider Notes (Signed)
  Physical Exam  BP 112/70 (BP Location: Left Arm)   Pulse 68   Temp 98.2 F (36.8 C) (Oral)   Resp 20   Ht 5\' 7"  (1.702 m)   Wt 185 lb (83.9 kg)   SpO2 95%   BMI 28.98 kg/m   Physical Exam  ED Course  Procedures  MDM Care assumed at 7 am. Patient came in yesterday for hallucinations, has chronic alcohol abuse. States that she drinks at least a bottle of wine daily. Has gradual mental status decline over the last few months. Had MRI brain and labs that were unremarkable. UA ? UTI. Patient IVC by previous provider and psych consult pending.   8 am  Patient A & O x 2. Denies hallucinations. No obvious tremors. Already on CIWA. Placed patient on keflex for possible UTI, urine culture sent. She doesn't meet admission criteria currently. I consulted social work, case management to assist with placement.   3:19 PM Social work recommend nursing home placement. Psych to see patient still.       Drenda Freeze, MD 10/26/16 1520

## 2016-10-26 NOTE — ED Notes (Signed)
Pt changed out by this RN and Pam, EDT. Pt assisted to the bathroom, noted to have a brief on that was wet. Pt changed into burgundy scrubs and assisted to the bathroom. Will continue to monitor for further patient needs.

## 2016-10-26 NOTE — ED Notes (Signed)
Pt given a cup of water, pt visualized drinking water at this time. Denies further needs. Denies wanting TV turned on, or lights dimmed. Pt is alert at this time. Will continue to monitor for further patient needs.

## 2016-10-26 NOTE — ED Notes (Signed)
Pt resting in bed at this time, NAD noted. Respirations even and unlabored, skin warm, dry, and intact. Explained delay to patient and why she had to move into the hallway. Pt states understanding at this time. Will continue to monitor for further patient needs.

## 2016-10-26 NOTE — Consult Note (Signed)
[facility] Consult   Reason for Consult:  Consult for 76 year old woman with recent decompensation in functioning and now auditory hallucinations and psychosis. Referring Physician:  Marcelene Butte Patient Identification: Virginia Walters MRN:  [id number] Principal Diagnosis: Dementia with behavioral problem Diagnosis:   Patient Active Problem List   Diagnosis Date Noted  . Dementia with behavioral problem [F03.91] 10/26/2016  . Alcohol abuse [F10.10] 10/26/2016  . Memory loss [R41.3] 10/12/2016  . Gait abnormality [R26.9] 10/12/2016  . Orthostatic dizziness [R42] 10/12/2016  . Paresthesia [R20.2] 10/12/2016  . Abnormal EKG [R94.31] 12/28/2010  . HTN (hypertension) [I10]   . Dyslipidemia [E78.5]   . PFO (patent foramen ovale) [Q21.1]   . Retinal artery occlusion [H34.9]   . GERD (gastroesophageal reflux disease) [K21.9]     Total Time spent with patient: 1 hour  Subjective:   Virginia Walters is a 76 y.o. female patient admitted with "I fell out of bed".  HPI:  Patient interviewed. Chart reviewed. 76 year old woman who was brought in by family. She apparently fell out of bed either last night or the night before and then laid on the ground until EMS were finally called and brought her in. Patient said that when she woke up and found herself on the floor she also noticed that her husband was laying in the bed. Her husband has been dead for over 4 years. Patient doesn't seem to of found that particularly remarkable. Patient is a rambling and disorganized historian. Admits that her mood is been down and negative. No interest in much of anything. She says that she sleeps okay and eats okay but can't really describe her normal routine in detail. She admits that she drinks about a bottle of wine every day and has been doing so for years. Family was concerned because patient has been talking about hearing voices inside of her house. Patient very clearly tells me that there are 2  people living in her house that she cannot see. She finds it irritating that they are living there. Patient is listless and confused lots of memory problems. Unclear about her medicine.  Social history: Apparently her husband died about 4 years ago. She's been living alone since then. The closest family around are cousins of her husband who live nearby. She says people come by maybe once a week to check up. 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  Medical history: History of high blood pressure and gastric reflux. Overweight. Appears to of had an injury to her arm from falling out of bed.  Substance abuse history: Patient says she drinks a bottle of wine a day. Alcohol level was not elevated on admission. She denies other drugs. Denies that she's ever had any kind of substance abuse treatment in the past.  Past Psychiatric History: It is listed that she is prescribed clonazepam and Prozac. She did not know the names of them but knew that she was on medicine for depression. She denies any history of psychiatric hospitalization. Denies any suicide attempts or violence.  Risk to Self: Suicidal Ideation: No Suicidal Intent: No Is patient at risk for suicide?: No Suicidal Plan?: No Access to Means: No What has been your use of drugs/alcohol within the last 12 months?: Denied use of drugs, occassional use of alcohol How many times?: 0 Other Self Harm Risks: denied Triggers for Past Attempts: None known Intentional Self Injurious Behavior: None Risk to Others: Homicidal Ideation: No Thoughts of Harm to Others: No Current Homicidal Intent:  No Current Homicidal Plan: No Access to Homicidal Means: No Identified Victim: None identified History of harm to others?: No Assessment of Violence: None Noted Violent Behavior Description: denied Does patient have access to weapons?: No Criminal Charges Pending?: No Does patient have a court date: No Prior Inpatient Therapy: Prior Inpatient  Therapy: No Prior Therapy Dates: n/a Prior Therapy Facilty/Provider(s): n/a Reason for Treatment: n/a Prior Outpatient Therapy: Prior Outpatient Therapy: No Prior Therapy Dates: n/a Prior Therapy Facilty/Provider(s): n/a Reason for Treatment: n/a Does patient have an ACCT team?: No Does patient have Intensive In-House Services?  : No Does patient have [facility] services? : No Does patient have P4CC services?: No  Past Medical History:  Past Medical History:  Diagnosis Date  . Dyslipidemia   . Frequent falls   . GERD (gastroesophageal reflux disease)   . HTN (hypertension)   . Memory loss   . PFO (patent foramen ovale)   . Retinal artery occlusion   . Weakness     Past Surgical History:  Procedure Laterality Date  . ANKLE ARTHROSCOPY WITH OPEN REDUCTION INTERNAL FIXATION (ORIF) Right   . APPENDECTOMY    . TUBAL LIGATION     Family History:  Family History  Problem Relation Age of Onset  . Esophageal cancer Father   . Dementia Mother    Family Psychiatric  History: Does know that she has a family history of alcohol abuse Social History:  History  Alcohol Use  . Yes    Comment: occassionally     History  Drug Use No    Social History   Social History  . Marital status: Married    Spouse name: N/A  . Number of children: 1  . Years of education: HS   Occupational History  . Retired    Social History Main Topics  . Smoking status: Former Smoker    Quit date: 12/27/1980  . Smokeless tobacco: Never Used  . Alcohol use Yes     Comment: occassionally  . Drug use: No  . Sexual activity: Not Currently   Other Topics Concern  . None   Social History Narrative   Lives at home alone.   Right-handed.   No daily use of caffeine.   Additional Social History:    Allergies:   Allergies  Allergen Reactions  . Oxycodone Itching    Labs:  Results for orders placed or performed during the hospital encounter of 10/25/16 (from the past 48 hour(s))  Ethanol      Status: None   Collection Time: 10/25/16  4:14 PM  Result Value Ref Range   Alcohol, Ethyl (B) <5 <5 mg/dL    Comment:        LOWEST DETECTABLE LIMIT FOR SERUM ALCOHOL IS 5 mg/dL FOR MEDICAL PURPOSES ONLY   CBC with Differential     Status: Abnormal   Collection Time: 10/25/16  4:14 PM  Result Value Ref Range   WBC 8.1 3.6 - 11.0 K/uL   RBC 4.48 3.80 - 5.20 MIL/uL   Hemoglobin 14.9 12.0 - 16.0 g/dL   HCT 42.6 35.0 - 47.0 %   MCV 95.1 80.0 - 100.0 fL   MCH 33.2 26.0 - 34.0 pg   MCHC 34.9 32.0 - 36.0 g/dL   RDW 12.2 11.5 - 14.5 %   Platelets 220 150 - 440 K/uL   Neutrophils Relative % 70 %   Neutro Abs 5.7 1.4 - 6.5 K/uL   Lymphocytes Relative 16 %   Lymphs Abs 1.3  1.0 - 3.6 K/uL   Monocytes Relative 12 %   Monocytes Absolute 1.0 (H) 0.2 - 0.9 K/uL   Eosinophils Relative 1 %   Eosinophils Absolute 0.1 0 - 0.7 K/uL   Basophils Relative 1 %   Basophils Absolute 0.0 0 - 0.1 K/uL  Comprehensive metabolic panel     Status: Abnormal   Collection Time: 10/25/16  4:14 PM  Result Value Ref Range   Sodium 134 (L) 135 - 145 mmol/L   Potassium 3.8 3.5 - 5.1 mmol/L   Chloride 99 (L) 101 - 111 mmol/L   CO2 22 22 - 32 mmol/L   Glucose, Bld 97 65 - 99 mg/dL   BUN 24 (H) 6 - 20 mg/dL   Creatinine, Ser 1.17 (H) 0.44 - 1.00 mg/dL   Calcium 9.8 8.9 - 10.3 mg/dL   Total Protein 7.6 6.5 - 8.1 g/dL   Albumin 4.3 3.5 - 5.0 g/dL   AST 31 15 - 41 U/L   ALT 21 14 - 54 U/L   Alkaline Phosphatase 78 38 - 126 U/L   Total Bilirubin 1.2 0.3 - 1.2 mg/dL   GFR calc non Af Amer 44 (L) >60 mL/min   GFR calc Af Amer 51 (L) >60 mL/min    Comment: (NOTE) The eGFR has been calculated using the CKD EPI equation. This calculation has not been validated in all clinical situations. eGFR's persistently <60 mL/min signify possible Chronic Kidney Disease.    Anion gap 13 5 - 15  Troponin I     Status: None   Collection Time: 10/25/16  4:14 PM  Result Value Ref Range   Troponin I <0.03 <0.03 ng/mL    Ammonia     Status: None   Collection Time: 10/25/16  4:14 PM  Result Value Ref Range   Ammonia 12 9 - 35 umol/L  TSH     Status: None   Collection Time: 10/25/16  4:14 PM  Result Value Ref Range   TSH 1.718 0.350 - 4.500 uIU/mL    Comment: Performed by a 3rd Generation assay with a functional sensitivity of <=0.01 uIU/mL.  Acetaminophen level     Status: Abnormal   Collection Time: 10/25/16  4:14 PM  Result Value Ref Range   Acetaminophen (Tylenol), Serum <10 (L) 10 - 30 ug/mL    Comment:        THERAPEUTIC CONCENTRATIONS VARY SIGNIFICANTLY. A RANGE OF 10-30 ug/mL MAY BE AN EFFECTIVE CONCENTRATION FOR MANY PATIENTS. HOWEVER, SOME ARE BEST TREATED AT CONCENTRATIONS OUTSIDE THIS RANGE. ACETAMINOPHEN CONCENTRATIONS >150 ug/mL AT 4 HOURS AFTER INGESTION AND >50 ug/mL AT 12 HOURS AFTER INGESTION ARE OFTEN ASSOCIATED WITH TOXIC REACTIONS.   Salicylate level     Status: None   Collection Time: 10/25/16  4:14 PM  Result Value Ref Range   Salicylate Lvl <8.0 2.8 - 30.0 mg/dL  CK     Status: None   Collection Time: 10/25/16  4:23 PM  Result Value Ref Range   Total CK 160 38 - 234 U/L  Urinalysis, Complete w Microscopic     Status: Abnormal   Collection Time: 10/25/16  5:09 PM  Result Value Ref Range   Color, Urine YELLOW (A) YELLOW   APPearance CLEAR (A) CLEAR   Specific Gravity, Urine 1.017 1.005 - 1.030   pH 5.0 5.0 - 8.0   Glucose, UA NEGATIVE NEGATIVE mg/dL   Hgb urine dipstick NEGATIVE NEGATIVE   Bilirubin Urine NEGATIVE NEGATIVE   Ketones, ur 5 (A) NEGATIVE  mg/dL   Protein, ur NEGATIVE NEGATIVE mg/dL   Nitrite NEGATIVE NEGATIVE   Leukocytes, UA MODERATE (A) NEGATIVE   RBC / HPF 0-5 0 - 5 RBC/hpf   WBC, UA 6-30 0 - 5 WBC/hpf   Bacteria, UA NONE SEEN NONE SEEN   Squamous Epithelial / LPF 0-5 (A) NONE SEEN   Mucous PRESENT    Hyaline Casts, UA PRESENT   Urine Drug Screen, Qualitative ([facility] only)     Status: None   Collection Time: 10/25/16  5:09 PM  Result Value  Ref Range   Tricyclic, Ur Screen NONE DETECTED NONE DETECTED   Amphetamines, Ur Screen NONE DETECTED NONE DETECTED   MDMA (Ecstasy)Ur Screen NONE DETECTED NONE DETECTED   Cocaine Metabolite,Ur [place] NONE DETECTED NONE DETECTED   Opiate, Ur Screen NONE DETECTED NONE DETECTED   Phencyclidine (PCP) Ur S NONE DETECTED NONE DETECTED   Cannabinoid 50 Ng, Ur [place] NONE DETECTED NONE DETECTED   Barbiturates, Ur Screen NONE DETECTED NONE DETECTED   Benzodiazepine, Ur Scrn NONE DETECTED NONE DETECTED   Methadone Scn, Ur NONE DETECTED NONE DETECTED    Comment: (NOTE) 416  Tricyclics, urine               Cutoff 1000 ng/mL 200  Amphetamines, urine             Cutoff 1000 ng/mL 300  MDMA (Ecstasy), urine           Cutoff 500 ng/mL 400  Cocaine Metabolite, urine       Cutoff 300 ng/mL 500  Opiate, urine                   Cutoff 300 ng/mL 600  Phencyclidine (PCP), urine      Cutoff 25 ng/mL 700  Cannabinoid, urine              Cutoff 50 ng/mL 800  Barbiturates, urine             Cutoff 200 ng/mL 900  Benzodiazepine, urine           Cutoff 200 ng/mL 1000 Methadone, urine                Cutoff 300 ng/mL 1100 1200 The urine drug screen provides only a preliminary, unconfirmed 1300 analytical test result and should not be used for non-medical 1400 purposes. Clinical consideration and professional judgment should 1500 be applied to any positive drug screen result due to possible 1600 interfering substances. A more specific alternate chemical method 1700 must be used in order to obtain a confirmed analytical result.  1800 Gas chromato graphy / mass spectrometry (GC/MS) is the preferred 1900 confirmatory method.   Urine culture     Status: Abnormal   Collection Time: 10/25/16  5:09 PM  Result Value Ref Range   Specimen Description URINE, RANDOM    Special Requests NONE    Culture MULTIPLE SPECIES PRESENT, SUGGEST RECOLLECTION (A)    Report Status 10/26/2016 FINAL     Current Facility-Administered  Medications  Medication Dose Route Frequency Provider Last Rate Last Dose  . aspirin EC tablet 81 mg  81 mg Oral Daily Gonzella Lex, MD      . cephALEXin (KEFLEX) capsule 500 mg  500 mg Oral Q8H Drenda Freeze, MD   500 mg at 10/26/16 1430  . clonazePAM (KLONOPIN) tablet 0.5 mg  0.5 mg Oral BID PRN Gonzella Lex, MD      . FLUoxetine (PROZAC) capsule 40 mg  40 mg Oral Daily Gonzella Lex, MD      . lisinopril (PRINIVIL,ZESTRIL) tablet 40 mg  40 mg Oral Daily Gonzella Lex, MD      . LORazepam (ATIVAN) injection 0-4 mg  0-4 mg Intravenous Q6H Orbie Pyo, MD       Followed by  . [START ON 10/27/2016] LORazepam (ATIVAN) injection 0-4 mg  0-4 mg Intravenous Q12H Orbie Pyo, MD      . pantoprazole (PROTONIX) EC tablet 40 mg  40 mg Oral Daily Gonzella Lex, MD      . thiamine (VITAMIN B-1) tablet 100 mg  100 mg Oral Daily Orbie Pyo, MD       Or  . thiamine (B-1) injection 100 mg  100 mg Intravenous Daily Orbie Pyo, MD   100 mg at 10/26/16 1104   Current Outpatient Prescriptions  Medication Sig Dispense Refill  . aspirin EC 81 MG tablet Take 1 tablet (81 mg total) by mouth daily. 90 tablet 4  . B Complex-Biotin-FA (B-COMPLEX PO) Take 1 capsule by mouth daily.     . Calcium Carbonate-Vitamin D (CALCIUM + D PO) Take 1 capsule by mouth 2 (two) times daily.     . Cholecalciferol (VITAMIN D) 2000 UNITS CAPS Take 1 capsule by mouth daily.     . clonazePAM (KLONOPIN) 0.5 MG tablet Take 0.5 mg by mouth 2 (two) times daily as needed for anxiety.     . dorzolamide (TRUSOPT) 2 % ophthalmic solution 1 drop 2 (two) times daily.      . fish oil-omega-3 fatty acids 1000 MG capsule Take 2 g by mouth 2 (two) times daily.      Marland Kitchen FLUoxetine (PROZAC) 40 MG capsule Take 1 capsule (40 mg total) by mouth daily. 30 capsule 11  . lisinopril (PRINIVIL,ZESTRIL) 40 MG tablet Take 40 mg by mouth daily.      Marland Kitchen MELATONIN PO Take 5 mg by mouth at bedtime.     .  Multiple Vitamin (MULTIVITAMIN) tablet Take 1 tablet by mouth daily.      . Multiple Vitamins-Minerals (HAIR/SKIN/NAILS) CAPS Take 1 capsule by mouth daily.     Marland Kitchen omeprazole (PRILOSEC) 20 MG capsule Take 20 mg by mouth daily.      . potassium chloride (KLOR-CON 10) 10 MEQ tablet Take 1 tablet (10 mEq total) by mouth daily. 30 tablet 11  . pravastatin (PRAVACHOL) 40 MG tablet Take 40 mg by mouth daily.      . TURMERIC PO Take 1 capsule by mouth daily.    . verapamil (COVERA HS) 240 MG (CO) 24 hr tablet Take 240 mg by mouth daily.     Marland Kitchen VITAMIN E PO Take 1 capsule by mouth 2 (two) times daily.     Marland Kitchen HYDROcodone-acetaminophen (NORCO/VICODIN) 5-325 MG per tablet Take 0.5-1 tablets by mouth every 8 (eight) hours as needed for moderate pain or severe pain. (Patient not taking: Reported on 10/25/2016) 10 tablet 0    Musculoskeletal: Strength & Muscle Tone: decreased Gait & Station: unable to stand Patient leans: Backward  Psychiatric Specialty Exam: Physical Exam  Nursing note and vitals reviewed. Constitutional: She appears well-developed. She appears listless.  HENT:  Head: Normocephalic and atraumatic.  Eyes: Conjunctivae are normal. Pupils are equal, round, and reactive to light.  Neck: Normal range of motion.  Cardiovascular: Regular rhythm and normal heart sounds.   Respiratory: Effort normal.  GI: Soft.  Musculoskeletal: Normal range of motion.  Arms: Neurological: She appears listless.  Skin: Skin is warm and dry.  Psychiatric: Her affect is blunt. Her speech is delayed. She is slowed and withdrawn. Cognition and memory are impaired. She expresses inappropriate judgment. She exhibits a depressed mood. She expresses no homicidal and no suicidal ideation. She exhibits abnormal recent memory.    Review of Systems  Constitutional: Negative.   HENT: Negative.   Eyes: Negative.   Respiratory: Negative.   Cardiovascular: Negative.   Gastrointestinal: Negative.   Musculoskeletal:  Negative.   Skin: Negative.   Neurological: Negative.   Psychiatric/Behavioral: Positive for depression, hallucinations, memory loss and substance abuse. Negative for suicidal ideas. The patient is nervous/anxious and has insomnia.     Blood pressure 124/69, pulse 95, temperature 97.6 F (36.4 C), temperature source Oral, resp. rate 20, height _0  (1.702 m), weight 83.9 kg (185 lb), SpO2 97 %.Body mass index is 28.98 kg/m.  General Appearance: Disheveled  Eye Contact:  Minimal  Speech:  Slow  Volume:  Decreased  Mood:  Dysphoric  Affect:  Blunt  Thought Process:  Disorganized  Orientation:  Negative  Thought Content:  Illogical and Hallucinations: Auditory  Suicidal Thoughts:  No  Homicidal Thoughts:  No  Memory:  Immediate;   Fair Recent;   Poor Remote;   Poor  Judgement:  Impaired  Insight:  Lacking  Psychomotor Activity:  Decreased  Concentration:  Concentration: Poor  Recall:  Poor  Fund of Knowledge:  Fair  Language:  Fair  Akathisia:  No  Handed:  Right  AIMS (if indicated):     Assets:  Housing  ADL's:  Impaired  Cognition:  Impaired,  Mild and Moderate  Sleep:        Treatment Plan Summary: Plan 76 year old woman who presents with dementia. Unable to remember any words at 3 minutes. Confused. Can't tell her recent story very coherently. Having auditory and visual hallucinations at home. Claims to be drinking heavily. Patient is flat and withdrawn and looks like she's had multiple bruises and is unable to take care of herself. Differential diagnosis includes dementia and depression and medical problems causing cognitive impairment. Patient will be admitted to a psychiatric hospital. I recommend referral to geriatric psychiatry. Restart outpatient meds. Monitor for stabilization in the emergency room. Case reviewed with emergency room physician and TTS.  Disposition: Recommend psychiatric Inpatient admission when medically cleared. Supportive therapy provided about  ongoing stressors.  Alethia Berthold, MD 10/26/2016 4:54 PM

## 2016-10-26 NOTE — ED Notes (Signed)
Report given to Kaitlin, RN.  

## 2016-10-26 NOTE — [facility] Counselor (Signed)
Sent referral for geriatric placement to [facility], [facility], [facility] and [facility] on 10/26/16

## 2016-10-26 NOTE — ED Notes (Signed)
This Rn received report from [place], Therapist, sports. Physical therapist at bedside at this time.

## 2016-10-27 MED ORDER — HALOPERIDOL LACTATE 5 MG/ML IJ SOLN: 2.5000 mg | Freq: Once | INTRAMUSCULAR | Status: DC

## 2016-10-27 MED ORDER — HALOPERIDOL LACTATE 5 MG/ML IJ SOLN
2.5000 mg | Freq: Once | INTRAMUSCULAR | Status: AC
  Administered 2016-10-27: 2.5 mg via INTRAVENOUS
  Filled 2016-10-27: qty 1

## 2016-10-27 NOTE — Clinical Social Work Note (Signed)
Psychiatry has assessed and recommending geriatric inpatient psych admission. Psychiatry discussed case with TTS and they are managing this. CSW signing off. Shela Leff MSW,LCSW [phone]

## 2016-10-27 NOTE — ED Notes (Signed)
Spoke with [place]. Loletha Grayer will run information by doctor and go from there.

## 2016-10-27 NOTE — ED Provider Notes (Signed)
 -----------------------------------------   1:58 PM on 10/27/2016 -----------------------------------------  Patient remains medically stable. Patient has been accepted to [facility], we'll transfer via [place].   Carrie Mew, MD 10/27/16 1359

## 2016-10-27 NOTE — ED Notes (Signed)
Referral information for Psychiatric Hospitalization faxed to;      Mikel Cella [phone]),    Mayer Camel [phone]).

## 2016-10-27 NOTE — ED Notes (Addendum)
Counselor fax Xray's and labs to [place] as requested Pt under review at [facility]

## 2016-10-27 NOTE — ED Notes (Signed)
Patient attempting to get out of bed to use restroom. Patient also removed splint on right arm stating "I don't need it, my arm's not broken." Patient assisted to use restroom and repositioned back in bed. Splint reapplied to arm. Patient cooperative but appears more confused than normal. MD made aware. Will continue to monitor.

## 2016-10-27 NOTE — ED Notes (Signed)
Patient has been accepted to [facility].  Patient assigned to room 150-B Accepting physician is Dr. Keane Scrape, MD Call report to [phone].  ER Staff is aware of it [facility].; Dr. Jerrol Banana MD & Mickel Baas Patient's Nurse)    Patient's Family/Support System Rosalyn Charters ) have been updated as well.

## 2016-10-27 NOTE — ED Notes (Signed)
Verbal consent for transfer given by POA Rosalyn Charters, daughter) to [place] in behavioral intake (see note).

## 2016-10-27 NOTE — ED Notes (Signed)
Daughter called for an update , RN spoke to daughter; provided update after obtaining password

## 2016-10-27 NOTE — ED Notes (Signed)
Counselor has spoken pts HCPOA Rosalyn Charters in depth about the pts plan of care. Pt guardian has agreed with the plan to transport the pt to [facility].

## 2016-10-27 NOTE — ED Notes (Signed)
Called  1800 to check on transport, no idea when transport would happen

## 2016-10-27 NOTE — ED Notes (Signed)
Patient has been resting comfortably. Will at time wake up if there is a lot going on in hallway but falls right back to sleep. Patient denies any needs when she wakes up.

## 2016-10-27 NOTE — ED Notes (Signed)
Called for transport

## 2016-10-27 NOTE — ED Notes (Addendum)
Spoke with patient's daughter, Anderson Malta, regarding concerns for placement. Daughter reports she does not want patient placed far from family. States she is faxing POA and long-term insurance information to TTS today. Wants to be called at work for updates [phone]

## 2016-10-28 DIAGNOSIS — F23 Brief psychotic disorder: Secondary | ICD-10-CM

## 2016-10-28 HISTORY — DX: Brief psychotic disorder: F23

## 2016-10-28 NOTE — ED Notes (Signed)
BEHAVIORAL HEALTH ROUNDING Patient sleeping: Yes.   Patient alert and oriented: not applicable SLEEPING Behavior appropriate: Yes.  ; If no, describe: SLEEPING Nutrition and fluids offered: No SLEEPING Toileting and hygiene offered: NoSLEEPING Sitter present: not applicable, Q 15 min safety rounds and observation via security camera. Law enforcement present: Yes ODS 

## 2016-10-28 NOTE — ED Notes (Signed)
Edison Nasuti nt in room with patient trying to put ocl back on

## 2016-10-28 NOTE — ED Notes (Signed)
Breakfast was given to patient and ocl taken back off of right wrist/forearm

## 2016-10-28 NOTE — ED Provider Notes (Signed)
Vitals:   10/28/16 0651 10/28/16 0702  BP: (!) 159/86 (!) 159/86  Pulse: 72 72  Resp: 18   Temp: 98.3 F (36.8 C)     Acute events overnight. Patient is under involuntary commitment, and essentially currently accepted for psychiatric disposition. Plan for transport today.     Lisa Roca, MD 10/28/16 [phone]

## 2016-10-28 NOTE — ED Provider Notes (Signed)
Patient was repeatedly pulling off her volar wrist splint. I was made aware this.  Went to evaluate the patient. She does not have any tenderness at the wrist or swelling. She does have a wrist drop and cannot extend her wrist. She does have good sensation in all fingers.  I reviewed this patient's wrist x-ray, and notes. There is no fractures. Does not appear to me that there is any concern about snuffbox tenderness. She does not have some flank tenderness on exam today.  From the notes it appears she had her MRI brain due to the neurologic finding.  Ultimately the restaurant was felt to be a radial deficit, peripheral neuropathy.  She does have a skin tear over the left forearm but it is not deep.  I'm most suspicious of contusion/compression to the radial nerve creating the deficits seen.  I did speak with Dr. Doy Mince, neurology, by phone, who recommended wrist drop splint with occupational therapy, as I place this consultation.  We also reviewed the imaging again, there was no imaging of the neck, and Dr. Doy Mince recommended cervical MRI without contrast. We discussed that this patient has been several days out from the fall without neck pain and has been moving around without any additional neurologic deficits, unlikely to be bony fracture. However from a compression standpoint, Dr. Doy Mince did recommend MRI of the cervical spine. I did place this order.     Lisa Roca, MD 10/28/16 [phone]

## 2016-10-28 NOTE — ED Notes (Signed)
Called Intake to verify patient still had bed at [facility], Tish Frederickson indicated  It was still available. Called to verify patient on list for transport.  She is on list    [phone]

## 2016-10-28 NOTE — ED Notes (Signed)
Pt transferred to [facility] in custody of [place]. Denies pain. Pt's wrist splinted before leaving. NAD noted.

## 2016-10-28 NOTE — ED Notes (Signed)
Placed ocl on right wrist/forearm

## 2016-11-17 ENCOUNTER — Other Ambulatory Visit: Payer: Medicare Other

## 2016-11-18 ENCOUNTER — Encounter: Payer: Medicare Other | Admitting: Neurology

## 2016-11-24 ENCOUNTER — Ambulatory Visit: Payer: Medicare Other | Admitting: Neurology

## 2017-03-02 ENCOUNTER — Ambulatory Visit (INDEPENDENT_AMBULATORY_CARE_PROVIDER_SITE_OTHER): Payer: Medicare Other | Admitting: Family Medicine

## 2017-03-02 ENCOUNTER — Encounter: Payer: Self-pay | Admitting: Family Medicine

## 2017-03-02 VITALS — BP 120/78 | HR 76 | Temp 98.7°F | Resp 18 | Ht 65.0 in | Wt 204.0 lb

## 2017-03-02 DIAGNOSIS — R413 Other amnesia: Secondary | ICD-10-CM

## 2017-03-02 DIAGNOSIS — Z7689 Persons encountering health services in other specified circumstances: Secondary | ICD-10-CM

## 2017-03-02 DIAGNOSIS — F1010 Alcohol abuse, uncomplicated: Secondary | ICD-10-CM | POA: Diagnosis not present

## 2017-03-02 DIAGNOSIS — I10 Essential (primary) hypertension: Secondary | ICD-10-CM

## 2017-03-02 DIAGNOSIS — E785 Hyperlipidemia, unspecified: Secondary | ICD-10-CM

## 2017-03-02 NOTE — Progress Notes (Signed)
Subjective:    Patient ID: Virginia Walters, female    DOB: 03-Mar-1941, 76 y.o.   MRN: [id number]  HPI  Patient was admitted to the hospital in March after a fall. She suffered nerve damage in her right arm due to prolonged immobilization. She was diagnosed with radial nerve palsy and is currently in a forearm splint due to the palsy in her hand. She is accompanied today by her sister.  Her sister takes care of her although the patient lives alone. Patient is widowed. Her husband died after battling Alzheimer's disease several years ago. She is lived independently ever since. From what I can gather she has a history of alcohol abuse and was drinking excess of wine. Her sister became concerned in March about her drinking and withheld any alcohol from her. It appears as though the patient went into alcohol withdrawal and was admitted to the hospital after a fall. During hospitalization she was hallucinating and was placed on Zyprexa. She was transferred from [facility] to a geriatric psychiatric unit in [place].  There she was also started on acamprosate to maintain alcohol alcohol abstinence. Patient has not drank any alcohol since that hospitalization. She also apparently had memory loss during the hospitalization. Both the patient and her sister deny any hallucinations over the last 4 months. Sr. denies any mood swings, delirium, sundowning, or severe depression. I performed a Mini-Mental status exam today. Patient is able to recall 3 out of 3 objects on rapid recall. She scores 5 out of 5 on date and location. She is able to perform serial sevens and spell world in reverse. She scores 30 out of 30 on the mental status exam which is markedly improved from her hospitalization. However on clock drawing, the spacing of her digits is noticeably irregular and she is unable to draw the correct time adjusting mild memory impairment though not as severe as that seen in the hospital. Sister supervising  her pill taking and also make sure that she eats regularly. The patient is currently taking folate, vitamin B12, and thiamine.  However she is a high fall risk. She is ambulating with a cane. 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 This causes her to ambulate awkwardly aggravating her left hip. She also drags her feet slightly placing her at high fall risk.  Past Medical History:  Diagnosis Date  . Dyslipidemia   . Frequent falls   . GERD (gastroesophageal reflux disease)   . HTN (hypertension)   . Memory loss   . PFO (patent foramen ovale)   . Retinal artery occlusion   . Weakness    Past Surgical History:  Procedure Laterality Date  . ANKLE ARTHROSCOPY WITH OPEN REDUCTION INTERNAL FIXATION (ORIF) Right   . APPENDECTOMY    . TUBAL LIGATION     Current Outpatient Prescriptions on File Prior to Visit  Medication Sig Dispense Refill  . aspirin EC 81 MG tablet Take 1 tablet (81 mg total) by mouth daily. 90 tablet 4  . B Complex-Biotin-FA (B-COMPLEX PO) Take 1 capsule by mouth daily.     . Calcium Carbonate-Vitamin D (CALCIUM + D PO) Take 1 capsule by mouth 2 (two) times daily.     . Cholecalciferol (VITAMIN D) 2000 UNITS CAPS Take 1 capsule by mouth daily.     . dorzolamide (TRUSOPT) 2 % ophthalmic solution 1 drop 2 (two) times daily.      . fish oil-omega-3 fatty  acids 1000 MG capsule Take 2 g by mouth 2 (two) times daily.      Marland Kitchen MELATONIN PO Take 5 mg by mouth at bedtime.     . Multiple Vitamin (MULTIVITAMIN) tablet Take 1 tablet by mouth daily.      . pravastatin (PRAVACHOL) 40 MG tablet Take 40 mg by mouth daily.      . TURMERIC PO Take 1 capsule by mouth daily.    . verapamil (COVERA HS) 240 MG (CO) 24 hr tablet Take 240 mg by mouth daily.     Marland Kitchen VITAMIN E PO Take 1 capsule by mouth 2 (two) times daily.      No current facility-administered medications on file prior to visit.      Allergies  Allergen Reactions  . Oxycodone  Itching   Social History   Social History  . Marital status: Married    Spouse name: N/A  . Number of children: 1  . Years of education: HS   Occupational History  . Retired    Social History Main Topics  . Smoking status: Former Smoker    Quit date: 12/27/1980  . Smokeless tobacco: Never Used  . Alcohol use Yes     Comment: occassionally  . Drug use: No  . Sexual activity: Not Currently   Other Topics Concern  . Not on file   Social History Narrative   Lives at home alone.   Right-handed.   No daily use of caffeine.   Family History  Problem Relation Age of Onset  . Esophageal cancer Father   . Dementia Mother      Review of Systems     Objective:   Physical Exam  Constitutional: She is oriented to person, place, and time. She appears well-developed and well-nourished. No distress.  HENT:  Head: Normocephalic and atraumatic.  Right Ear: External ear normal.  Left Ear: External ear normal.  Nose: Nose normal.  Mouth/Throat: Oropharynx is clear and moist. No oropharyngeal exudate.  Eyes: Pupils are equal, round, and reactive to light. Conjunctivae and EOM are normal. Right eye exhibits no discharge. Left eye exhibits no discharge. No scleral icterus.  Neck: Normal range of motion. Neck supple. No JVD present. No tracheal deviation present. No thyromegaly present.  Cardiovascular: Normal rate, regular rhythm, normal heart sounds and intact distal pulses.  Exam reveals no gallop and no friction rub.   No murmur heard. Pulmonary/Chest: Effort normal and breath sounds normal. No stridor. No respiratory distress. She has no wheezes. She has no rales. She exhibits no tenderness.  Abdominal: Soft. Bowel sounds are normal. She exhibits no distension and no mass. There is no tenderness. There is no rebound and no guarding.  Lymphadenopathy:    She has no cervical adenopathy.  Neurological: She is alert and oriented to person, place, and time. She has normal reflexes. She  displays normal reflexes. No cranial nerve deficit. She exhibits normal muscle tone. Coordination normal.  Skin: She is not diaphoretic.  Psychiatric: She has a normal mood and affect. Her behavior is normal. Judgment and thought content normal.  Vitals reviewed.         Assessment & Plan:  Dyslipidemia - Plan: CBC with Differential/Platelet, COMPLETE METABOLIC PANEL WITH GFR, Lipid panel  Essential hypertension  Encounter to establish care with new doctor  Memory loss - Plan: Vitamin B1, whole blood, Folate, Vitamin B12  Alcohol abuse - Plan: Vitamin B1, whole blood, Folate, Vitamin B12  I would like the patient to return  fasting for a CBC, CMP, fasting lipid panel. She has a history of dyslipidemia. There is chronic microvascular ischemic changes on her MRI from March. Therefore I would like to keep her LDL below 100 of possible. Her blood pressure today is well controlled. Her delirium and confusion present in March seems to be related more to alcohol withdrawal then psychosis associated with dementia. To reduce her risk of falling I would like to discontinue zyprexa.  If the patient begins to experience any hallucinations, delirium, or sundowning, but can start the medicine back. At the present time I believe her memory loss is mild and I do not see a role for Aricept or Namenda. I will have the patient returns I can check a vitamin B12 level, a thiamine level, and a folate level to determine if she needs to continue vitamin replacement therapy now that she is eating a well-balanced diet under the care of her sister.

## 2017-03-06 ENCOUNTER — Other Ambulatory Visit: Payer: Medicare Other

## 2017-03-06 LAB — COMPLETE METABOLIC PANEL WITH GFR
ALT: 13 U/L (ref 6–29)
AST: 14 U/L (ref 10–35)
Albumin: 4.1 g/dL (ref 3.6–5.1)
Alkaline Phosphatase: 64 U/L (ref 33–130)
BUN: 27 mg/dL — ABNORMAL HIGH (ref 7–25)
CO2: 27 mmol/L (ref 20–31)
Calcium: 9.5 mg/dL (ref 8.6–10.4)
Chloride: 103 mmol/L (ref 98–110)
Creat: 1.08 mg/dL — ABNORMAL HIGH (ref 0.60–0.93)
GFR, Est African American: 58 mL/min — ABNORMAL LOW (ref >=60)
GFR, Est Non African American: 50 mL/min — ABNORMAL LOW (ref >=60)
Glucose, Bld: 84 mg/dL (ref 70–99)
Potassium: 4 mmol/L (ref 3.5–5.3)
Sodium: 140 mmol/L (ref 135–146)
Total Bilirubin: 0.3 mg/dL (ref 0.2–1.2)
Total Protein: 6.7 g/dL (ref 6.1–8.1)

## 2017-03-06 LAB — LIPID PANEL
Cholesterol: 158 mg/dL (ref <200)
HDL: 72 mg/dL (ref >50)
LDL Cholesterol: 64 mg/dL (ref <100)
Total CHOL/HDL Ratio: 2.2 Ratio (ref <5.0)
Triglycerides: 111 mg/dL (ref <150)
VLDL: 22 mg/dL (ref <30)

## 2017-03-06 LAB — CBC WITH DIFFERENTIAL/PLATELET
Basophils Absolute: 71 cells/uL (ref 0–200)
Basophils Relative: 1 %
Eosinophils Absolute: 213 cells/uL (ref 15–500)
Eosinophils Relative: 3 %
HCT: 36.2 % (ref 35.0–45.0)
Hemoglobin: 11.8 g/dL — ABNORMAL LOW (ref 12.0–15.0)
Lymphocytes Relative: 26 %
Lymphs Abs: 1846 cells/uL (ref 850–3900)
MCH: 30.2 pg (ref 27.0–33.0)
MCHC: 32.6 g/dL (ref 32.0–36.0)
MCV: 92.6 fL (ref 80.0–100.0)
MPV: 8.7 fL (ref 7.5–12.5)
Monocytes Absolute: 639 cells/uL (ref 200–950)
Monocytes Relative: 9 %
Neutro Abs: 4331 cells/uL (ref 1500–7800)
Neutrophils Relative %: 61 %
Platelets: 254 10*3/uL (ref 140–400)
RBC: 3.91 MIL/uL (ref 3.80–5.10)
RDW: 13 % (ref 11.0–15.0)
WBC: 7.1 10*3/uL (ref 3.8–10.8)

## 2017-03-07 ENCOUNTER — Other Ambulatory Visit: Payer: Medicare Other

## 2017-03-07 LAB — VITAMIN B1, WHOLE BLOOD

## 2017-03-07 LAB — FOLATE: Folate: >24 ng/mL (ref >5.4)

## 2017-03-07 LAB — VITAMIN B12: Vitamin B-12: 818 pg/mL (ref 200–1100)

## 2017-03-08 ENCOUNTER — Encounter: Payer: Self-pay | Admitting: Family Medicine

## 2017-03-12 LAB — VITAMIN B1: Vitamin B1 (Thiamine): 27 nmol/L (ref 8–30)

## 2017-04-11 ENCOUNTER — Other Ambulatory Visit: Payer: Self-pay | Admitting: Family Medicine

## 2017-04-11 MED ORDER — VERAPAMIL HCL ER 240 MG PO TBCR: 240.0000 mg | EXTENDED_RELEASE_TABLET | Freq: Every day | ORAL | 2 refills | Status: DC

## 2017-04-24 ENCOUNTER — Other Ambulatory Visit: Payer: Self-pay | Admitting: *Deleted

## 2017-05-01 ENCOUNTER — Telehealth: Payer: Self-pay

## 2017-05-01 NOTE — Telephone Encounter (Signed)
Patient called and LMOM about a refill on her HCTZ 25 mg. Not on patients chart. Need more information to refill medication. Attempted to call patient was unable to speak with her.

## 2017-05-02 ENCOUNTER — Other Ambulatory Visit: Payer: Self-pay

## 2017-05-02 NOTE — Telephone Encounter (Signed)
Rx request for refill on HCTZ 25 mg  Medication was not prescribed by PCP. Patient needs to set an appt to f/u on medication

## 2017-05-02 NOTE — Telephone Encounter (Signed)
Attempted to call on patient new number due to other number listed in her chart was incorrect. New number replaced. When patient calls back she needs to set up an appt for f/u on medication HCTZ

## 2017-05-23 ENCOUNTER — Ambulatory Visit (INDEPENDENT_AMBULATORY_CARE_PROVIDER_SITE_OTHER): Payer: Medicare Other | Admitting: *Deleted

## 2017-05-23 DIAGNOSIS — Z23 Encounter for immunization: Secondary | ICD-10-CM | POA: Diagnosis not present

## 2017-06-14 ENCOUNTER — Other Ambulatory Visit: Payer: Self-pay | Admitting: Family Medicine

## 2017-06-14 MED ORDER — ESCITALOPRAM OXALATE 10 MG PO TABS: 10.0000 mg | ORAL_TABLET | Freq: Every day | ORAL | 1 refills | Status: DC

## 2017-06-14 MED ORDER — HYDROCHLOROTHIAZIDE 25 MG PO TABS: ORAL_TABLET | ORAL | 1 refills | Status: DC

## 2017-06-14 MED ORDER — ACAMPROSATE CALCIUM 333 MG PO TBEC: 333.0000 mg | DELAYED_RELEASE_TABLET | Freq: Three times a day (TID) | ORAL | 1 refills | Status: DC

## 2017-06-14 MED ORDER — TRAZODONE HCL 50 MG PO TABS: 50.0000 mg | ORAL_TABLET | Freq: Every day | ORAL | 1 refills | Status: DC

## 2017-06-14 MED ORDER — ASPIRIN EC 81 MG PO TBEC: 81.0000 mg | DELAYED_RELEASE_TABLET | Freq: Every day | ORAL | 4 refills | Status: DC

## 2017-06-14 MED ORDER — VERAPAMIL HCL ER 240 MG PO TBCR: 240.0000 mg | EXTENDED_RELEASE_TABLET | Freq: Every day | ORAL | 2 refills | Status: DC

## 2017-06-14 MED ORDER — PRAVASTATIN SODIUM 40 MG PO TABS: 40.0000 mg | ORAL_TABLET | Freq: Every day | ORAL | 1 refills | Status: DC

## 2017-06-20 ENCOUNTER — Telehealth: Payer: Self-pay

## 2017-06-20 NOTE — Telephone Encounter (Signed)
Joaquim Lai from [facility] called stating they had sent over several faxes requesting rx for potassium chloride. Medication list shows patient is no longer on potassium chloride and according to last lab check patient potassium level was within range.  Joaquim Lai stated she would have the patient call our office.

## 2017-07-14 DIAGNOSIS — H00025 Hordeolum internum left lower eyelid: Secondary | ICD-10-CM | POA: Diagnosis not present

## 2017-07-14 DIAGNOSIS — H11153 Pinguecula, bilateral: Secondary | ICD-10-CM | POA: Diagnosis not present

## 2017-07-14 DIAGNOSIS — H01009 Unspecified blepharitis unspecified eye, unspecified eyelid: Secondary | ICD-10-CM | POA: Diagnosis not present

## 2017-07-14 DIAGNOSIS — H40013 Open angle with borderline findings, low risk, bilateral: Secondary | ICD-10-CM | POA: Diagnosis not present

## 2017-07-19 ENCOUNTER — Other Ambulatory Visit: Payer: Self-pay | Admitting: Family Medicine

## 2017-07-19 MED ORDER — HYDROCHLOROTHIAZIDE 25 MG PO TABS: 25.0000 mg | ORAL_TABLET | Freq: Every day | ORAL | 3 refills | Status: DC

## 2017-08-16 ENCOUNTER — Other Ambulatory Visit: Payer: Self-pay | Admitting: Family Medicine

## 2017-08-16 MED ORDER — ASPIRIN EC 81 MG PO TBEC: 81.0000 mg | DELAYED_RELEASE_TABLET | Freq: Every day | ORAL | 3 refills | Status: DC

## 2017-08-31 ENCOUNTER — Ambulatory Visit (INDEPENDENT_AMBULATORY_CARE_PROVIDER_SITE_OTHER): Payer: Medicare Other | Admitting: Family Medicine

## 2017-08-31 ENCOUNTER — Encounter: Payer: Self-pay | Admitting: Family Medicine

## 2017-08-31 VITALS — BP 132/80 | HR 74 | Temp 97.9°F | Resp 14 | Ht 65.0 in | Wt 228.0 lb

## 2017-08-31 DIAGNOSIS — I10 Essential (primary) hypertension: Secondary | ICD-10-CM | POA: Diagnosis not present

## 2017-08-31 DIAGNOSIS — N3281 Overactive bladder: Secondary | ICD-10-CM | POA: Diagnosis not present

## 2017-08-31 LAB — BASIC METABOLIC PANEL WITH GFR
BUN: 22 mg/dL (ref 7–25)
CO2: 30 mmol/L (ref 20–32)
Calcium: 9.7 mg/dL (ref 8.6–10.4)
Chloride: 103 mmol/L (ref 98–110)
Creat: 0.88 mg/dL (ref 0.60–0.93)
GFR, Est African American: 74 mL/min/{1.73_m2} (ref >=60)
GFR, Est Non African American: 64 mL/min/{1.73_m2} (ref >=60)
Glucose, Bld: 151 mg/dL — ABNORMAL HIGH (ref 65–99)
Potassium: 3.5 mmol/L (ref 3.5–5.3)
Sodium: 141 mmol/L (ref 135–146)

## 2017-08-31 LAB — EXTRA LAV TOP TUBE

## 2017-08-31 NOTE — Progress Notes (Signed)
Subjective:    Patient ID: Virginia Walters, female    DOB: [birth year], 77 y.o.   MRN: [id number]  HPI Patient is here today for a follow-up of her chronic medical conditions.  She states that we have not refilled her potassium.  We were unaware that she was taking potassium as she did not have it with her at her last visit.  She has been out of her potassium for the last 3 months.  She is on hydrochlorothiazide for hypertension.  She denies any cramps.  She denies any palpitations.  Her biggest concern is the fact that she wakes up 4-5 times a night to urinate.  She reports certain urges to urinate and urge incontinence.  She is having to wear adult diapers because of urge incontinence.  She denies any dysuria or hematuria Past Medical History:  Diagnosis Date  . Dyslipidemia   . Frequent falls   . GERD (gastroesophageal reflux disease)   . HTN (hypertension)   . Memory loss   . PFO (patent foramen ovale)   . Retinal artery occlusion   . Weakness    Past Surgical History:  Procedure Laterality Date  . ANKLE ARTHROSCOPY WITH OPEN REDUCTION INTERNAL FIXATION (ORIF) Right   . APPENDECTOMY    . TUBAL LIGATION     Current Outpatient Medications on File Prior to Visit  Medication Sig Dispense Refill  . acamprosate (CAMPRAL) 333 MG tablet Take 1 tablet (333 mg total) by mouth 3 (three) times daily with meals. 270 tablet 1  . aspirin EC 81 MG tablet Take 1 tablet (81 mg total) by mouth daily. 90 tablet 3  . B Complex-Biotin-FA (B-COMPLEX PO) Take 1 capsule by mouth daily.     . Calcium Carbonate-Vitamin D (CALCIUM + D PO) Take 1 capsule by mouth 2 (two) times daily.     . Cholecalciferol (VITAMIN D) 2000 UNITS CAPS Take 1 capsule by mouth daily.     . dorzolamide (TRUSOPT) 2 % ophthalmic solution 1 drop 2 (two) times daily.      Marland Kitchen escitalopram (LEXAPRO) 10 MG tablet Take 1 tablet (10 mg total) by mouth daily. 90 tablet 1  . fish oil-omega-3 fatty acids 1000 MG capsule Take 2 g by mouth 2  (two) times daily.      . folic acid (FOLVITE) 1 MG tablet Take 1 mg by mouth daily.    . hydrochlorothiazide (HYDRODIURIL) 25 MG tablet Take 1 tablet (25 mg total) by mouth daily. 90 tablet 3  . MELATONIN PO Take 5 mg by mouth at bedtime.     . Multiple Vitamin (MULTIVITAMIN) tablet Take 1 tablet by mouth daily.      Marland Kitchen OLANZapine (ZYPREXA) 2.5 MG tablet Take 2.5 mg by mouth at bedtime.    . pravastatin (PRAVACHOL) 40 MG tablet Take 1 tablet (40 mg total) by mouth daily. 90 tablet 1  . thiamine 100 MG tablet Take 100 mg by mouth daily.    . traZODone (DESYREL) 50 MG tablet Take 1 tablet (50 mg total) by mouth at bedtime. 90 tablet 1  . TURMERIC PO Take 1 capsule by mouth daily.    . verapamil (CALAN-SR) 240 MG CR tablet Take 1 tablet (240 mg total) by mouth at bedtime. 90 tablet 2  . vitamin B-12 (CYANOCOBALAMIN) 1000 MCG tablet Take 1,000 mcg by mouth daily.    Marland Kitchen VITAMIN E PO Take 1 capsule by mouth 2 (two) times daily.      No current facility-administered  medications on file prior to visit.    Allergies  Allergen Reactions  . Oxycodone Itching   Social History   Socioeconomic History  . Marital status: Married    Spouse name: Not on file  . Number of children: 1  . Years of education: HS  . Highest education level: Not on file  Social Needs  . Financial resource strain: Not on file  . Food insecurity - worry: Not on file  . Food insecurity - inability: Not on file  . Transportation needs - medical: Not on file  . Transportation needs - non-medical: Not on file  Occupational History  . Occupation: Retired  Tobacco Use  . Smoking status: Former Smoker    Last attempt to quit: 12/27/1980    Years since quitting: 36.7  . Smokeless tobacco: Never Used  Substance and Sexual Activity  . Alcohol use: Yes    Comment: occassionally  . Drug use: No  . Sexual activity: Not Currently  Other Topics Concern  . Not on file  Social History Narrative   Lives at home alone.    Right-handed.   No daily use of caffeine.      Review of Systems  All other systems reviewed and are negative.      Objective:   Physical Exam  Constitutional: She appears well-developed and well-nourished.  Cardiovascular: Normal rate, regular rhythm and normal heart sounds.  Pulmonary/Chest: Effort normal and breath sounds normal. No respiratory distress. She has no wheezes. She has no rales.  Abdominal: Soft. Bowel sounds are normal. She exhibits no distension. There is no tenderness. There is no rebound and no guarding.  Musculoskeletal: She exhibits no edema.  Vitals reviewed.         Assessment & Plan:  Benign essential HTN - Plan: BASIC METABOLIC PANEL WITH GFR  Overactive bladder  Her blood pressure is well controlled.  I will check a potassium level.  If her potassium level is normal, I will make no changes.  If her potassium level is low, rather than add potassium, I will discontinue hydrochlorothiazide especially due to her overactive bladder and replace it with lisinopril which may help raise her potassium and help with her urge incontinence.  I believe she has overactive bladder however I would avoid oxybutynin given her underlying history of memory problems.  I did give her samples of Myrbetriq 25 mg a day to try for 1 month and then recheck to see if she would be willing to pay for the name brand medication

## 2017-09-04 ENCOUNTER — Other Ambulatory Visit: Payer: Self-pay | Admitting: Family Medicine

## 2017-09-04 MED ORDER — LISINOPRIL 20 MG PO TABS: 20.0000 mg | ORAL_TABLET | Freq: Every day | ORAL | 3 refills | Status: DC

## 2017-09-21 ENCOUNTER — Telehealth: Payer: Self-pay | Admitting: Family Medicine

## 2017-09-21 MED ORDER — MIRABEGRON ER 25 MG PO TB24: 25.0000 mg | ORAL_TABLET | Freq: Every day | ORAL | 5 refills | Status: DC

## 2017-09-21 NOTE — Telephone Encounter (Signed)
Called LMOVM stating that she would like the Myrbetriq called into [facility] that it is working well for her.  Med sent to pharm as per LOV.

## 2017-10-13 DIAGNOSIS — H40013 Open angle with borderline findings, low risk, bilateral: Secondary | ICD-10-CM | POA: Diagnosis not present

## 2017-10-13 DIAGNOSIS — H01009 Unspecified blepharitis unspecified eye, unspecified eyelid: Secondary | ICD-10-CM | POA: Diagnosis not present

## 2017-10-16 ENCOUNTER — Other Ambulatory Visit: Payer: Self-pay | Admitting: Family Medicine

## 2018-01-27 ENCOUNTER — Other Ambulatory Visit: Payer: Self-pay | Admitting: Family Medicine

## 2018-02-16 ENCOUNTER — Encounter: Payer: Self-pay | Admitting: Family Medicine

## 2018-02-16 ENCOUNTER — Ambulatory Visit (INDEPENDENT_AMBULATORY_CARE_PROVIDER_SITE_OTHER): Payer: Medicare Other | Admitting: Family Medicine

## 2018-02-16 VITALS — BP 146/82 | HR 70 | Temp 98.0°F | Resp 18 | Ht 65.0 in | Wt 232.0 lb

## 2018-02-16 DIAGNOSIS — L309 Dermatitis, unspecified: Secondary | ICD-10-CM | POA: Diagnosis not present

## 2018-02-16 MED ORDER — MOMETASONE FUROATE 0.1 % EX OINT: TOPICAL_OINTMENT | Freq: Every day | CUTANEOUS | 1 refills | Status: DC

## 2018-02-16 NOTE — Progress Notes (Signed)
Subjective:    Patient ID: Virginia Walters, female    DOB: [birth year], 77 y.o.   MRN: [id number]  HPI Patient reports a 2-week history of a rash.  It began on the anterior surface of her left thigh.  The rash there are erythematous polygonal papules coalescing into a patch with a slight violaceous hue.  It has features of eczema but it also has features of lichen planus.  She also has a rash on her back at her waistline.  These are erythematous macules and papules with no distinctive pattern.  They are irregular in shape.  They are extremely pruritic.  She is also reporting itching on her anterior right thigh and on the dorsal surfaces of both forearms.  There is no visible rash in this area.  She denies any possible exposure to scabies.  She does have a remote history of eczema.  She denies any possible exposure to poison oak or poison ivy.  She denies any new detergents or chemicals at home that she could be reacting to  Past Medical History:  Diagnosis Date  . Dyslipidemia   . Frequent falls   . GERD (gastroesophageal reflux disease)   . HTN (hypertension)   . Memory loss   . PFO (patent foramen ovale)   . Retinal artery occlusion   . Weakness    Past Surgical History:  Procedure Laterality Date  . ANKLE ARTHROSCOPY WITH OPEN REDUCTION INTERNAL FIXATION (ORIF) Right   . APPENDECTOMY    . TUBAL LIGATION     Current Outpatient Medications on File Prior to Visit  Medication Sig Dispense Refill  . aspirin EC 81 MG tablet Take 1 tablet (81 mg total) by mouth daily. 90 tablet 3  . B Complex-Biotin-FA (B-COMPLEX PO) Take 1 capsule by mouth daily.     . Calcium Carbonate-Vitamin D (CALCIUM + D PO) Take 1 capsule by mouth 2 (two) times daily.     . Cholecalciferol (VITAMIN D) 2000 UNITS CAPS Take 1 capsule by mouth daily.     . dorzolamide (TRUSOPT) 2 % ophthalmic solution 1 drop 2 (two) times daily.      Marland Kitchen escitalopram (LEXAPRO) 10 MG tablet TAKE 1 TABLET BY MOUTH  DAILY 90 tablet 1    . fish oil-omega-3 fatty acids 1000 MG capsule Take 2 g by mouth 2 (two) times daily.      . folic acid (FOLVITE) 1 MG tablet Take 1 mg by mouth daily.    Marland Kitchen latanoprost (XALATAN) 0.005 % ophthalmic solution     . lisinopril (PRINIVIL,ZESTRIL) 20 MG tablet Take 1 tablet (20 mg total) by mouth daily. 90 tablet 3  . MELATONIN PO Take 5 mg by mouth at bedtime.     . Multiple Vitamin (MULTIVITAMIN) tablet Take 1 tablet by mouth daily.      . pravastatin (PRAVACHOL) 40 MG tablet TAKE 1 TABLET BY MOUTH  DAILY 90 tablet 1  . traZODone (DESYREL) 50 MG tablet TAKE 1 TABLET BY MOUTH AT  BEDTIME 90 tablet 1  . TURMERIC PO Take 1 capsule by mouth daily.    . verapamil (CALAN-SR) 240 MG CR tablet TAKE 1 TABLET BY MOUTH AT  BEDTIME 90 tablet 2  . vitamin B-12 (CYANOCOBALAMIN) 1000 MCG tablet Take 1,000 mcg by mouth daily.    Marland Kitchen VITAMIN E PO Take 180 mg by mouth daily.     Marland Kitchen acamprosate (CAMPRAL) 333 MG tablet TAKE 1 TABLET BY MOUTH 3  TIMES DAILY WITH MEALS (Patient  not taking: Reported on 02/16/2018) 270 tablet 1  . mirabegron ER (MYRBETRIQ) 25 MG TB24 tablet Take 1 tablet (25 mg total) by mouth daily. (Patient not taking: Reported on 02/16/2018) 30 tablet 5   No current facility-administered medications on file prior to visit.    Allergies  Allergen Reactions  . Oxycodone Itching   Social History   Socioeconomic History  . Marital status: Married    Spouse name: Not on file  . Number of children: 1  . Years of education: HS  . Highest education level: Not on file  Occupational History  . Occupation: Retired  Scientific laboratory technician  . Financial resource strain: Not on file  . Food insecurity:    Worry: Not on file    Inability: Not on file  . Transportation needs:    Medical: Not on file    Non-medical: Not on file  Tobacco Use  . Smoking status: Former Smoker    Last attempt to quit: 12/27/1980    Years since quitting: 37.1  . Smokeless tobacco: Never Used  Substance and Sexual Activity  . Alcohol  use: Yes    Comment: occassionally  . Drug use: No  . Sexual activity: Not Currently  Lifestyle  . Physical activity:    Days per week: Not on file    Minutes per session: Not on file  . Stress: Not on file  Relationships  . Social connections:    Talks on phone: Not on file    Gets together: Not on file    Attends religious service: Not on file    Active member of club or organization: Not on file    Attends meetings of clubs or organizations: Not on file    Relationship status: Not on file  . Intimate partner violence:    Fear of current or ex partner: Not on file    Emotionally abused: Not on file    Physically abused: Not on file    Forced sexual activity: Not on file  Other Topics Concern  . Not on file  Social History Narrative   Lives at home alone.   Right-handed.   No daily use of caffeine.      Review of Systems  All other systems reviewed and are negative.      Objective:   Physical Exam  Constitutional: She appears well-developed and well-nourished.  Cardiovascular: Normal rate, regular rhythm and normal heart sounds.  Pulmonary/Chest: Effort normal and breath sounds normal. No respiratory distress. She has no wheezes. She has no rales.  Abdominal: Soft. Bowel sounds are normal. She exhibits no distension. There is no tenderness. There is no rebound and no guarding.  Musculoskeletal: She exhibits no edema.  Skin: Rash noted.  Vitals reviewed.         Assessment & Plan:  Dermatitis  Differential diagnosis includes atopic dermatitis versus lichen planus versus contact dermatitis.  I believe is more likely atopic dermatitis versus lichen planus.  I have recommended trying Elocon ointment applied once daily to the affected areas and reassess in 1 week.  Consider prednisone taper pack if spreading.  If spreading, keep scabies in the differential diagnosis just due to the level of itching the patient is reporting.

## 2018-04-19 ENCOUNTER — Encounter: Payer: Self-pay | Admitting: Family Medicine

## 2018-04-19 DIAGNOSIS — H01009 Unspecified blepharitis unspecified eye, unspecified eyelid: Secondary | ICD-10-CM | POA: Diagnosis not present

## 2018-04-19 DIAGNOSIS — H401123 Primary open-angle glaucoma, left eye, severe stage: Secondary | ICD-10-CM | POA: Diagnosis not present

## 2018-04-19 DIAGNOSIS — H2513 Age-related nuclear cataract, bilateral: Secondary | ICD-10-CM | POA: Diagnosis not present

## 2018-04-19 DIAGNOSIS — H401112 Primary open-angle glaucoma, right eye, moderate stage: Secondary | ICD-10-CM | POA: Diagnosis not present

## 2018-04-19 LAB — HM DIABETES EYE EXAM

## 2018-05-07 ENCOUNTER — Other Ambulatory Visit: Payer: Self-pay | Admitting: Family Medicine

## 2018-06-05 ENCOUNTER — Ambulatory Visit (INDEPENDENT_AMBULATORY_CARE_PROVIDER_SITE_OTHER): Payer: Medicare Other

## 2018-06-05 DIAGNOSIS — Z23 Encounter for immunization: Secondary | ICD-10-CM

## 2018-06-05 NOTE — Progress Notes (Signed)
Patient was in office for high dose flu vaccine.Patient received vaccine in her left deltoid. Patient tolerated well

## 2018-07-20 ENCOUNTER — Emergency Department: Payer: Medicare Other

## 2018-07-20 ENCOUNTER — Encounter: Payer: Self-pay | Admitting: Emergency Medicine

## 2018-07-20 ENCOUNTER — Inpatient Hospital Stay
Admission: EM | Admit: 2018-07-20 | Discharge: 2018-07-22 | DRG: 811 | Disposition: A | Discharge status: Home Health | Payer: Medicare Other | Attending: Internal Medicine | Admitting: Internal Medicine

## 2018-07-20 ENCOUNTER — Other Ambulatory Visit: Payer: Self-pay

## 2018-07-20 DIAGNOSIS — D649 Anemia, unspecified: Secondary | ICD-10-CM | POA: Diagnosis not present

## 2018-07-20 DIAGNOSIS — Z818 Family history of other mental and behavioral disorders: Secondary | ICD-10-CM | POA: Diagnosis not present

## 2018-07-20 DIAGNOSIS — I110 Hypertensive heart disease with heart failure: Secondary | ICD-10-CM | POA: Diagnosis present

## 2018-07-20 DIAGNOSIS — I5020 Unspecified systolic (congestive) heart failure: Secondary | ICD-10-CM | POA: Diagnosis present

## 2018-07-20 DIAGNOSIS — Z885 Allergy status to narcotic agent status: Secondary | ICD-10-CM

## 2018-07-20 DIAGNOSIS — Z9851 Tubal ligation status: Secondary | ICD-10-CM

## 2018-07-20 DIAGNOSIS — K279 Peptic ulcer, site unspecified, unspecified as acute or chronic, without hemorrhage or perforation: Secondary | ICD-10-CM | POA: Diagnosis not present

## 2018-07-20 DIAGNOSIS — Z800 Family history of malignant neoplasm of digestive organs: Secondary | ICD-10-CM

## 2018-07-20 DIAGNOSIS — K921 Melena: Secondary | ICD-10-CM | POA: Diagnosis not present

## 2018-07-20 DIAGNOSIS — Z86010 Personal history of colonic polyps: Secondary | ICD-10-CM | POA: Diagnosis not present

## 2018-07-20 DIAGNOSIS — R0602 Shortness of breath: Secondary | ICD-10-CM

## 2018-07-20 DIAGNOSIS — Z8673 Personal history of transient ischemic attack (TIA), and cerebral infarction without residual deficits: Secondary | ICD-10-CM

## 2018-07-20 DIAGNOSIS — D509 Iron deficiency anemia, unspecified: Secondary | ICD-10-CM | POA: Diagnosis not present

## 2018-07-20 DIAGNOSIS — F329 Major depressive disorder, single episode, unspecified: Secondary | ICD-10-CM | POA: Diagnosis present

## 2018-07-20 DIAGNOSIS — K254 Chronic or unspecified gastric ulcer with hemorrhage: Secondary | ICD-10-CM | POA: Diagnosis present

## 2018-07-20 DIAGNOSIS — R195 Other fecal abnormalities: Secondary | ICD-10-CM | POA: Diagnosis not present

## 2018-07-20 DIAGNOSIS — Z87891 Personal history of nicotine dependence: Secondary | ICD-10-CM | POA: Diagnosis not present

## 2018-07-20 DIAGNOSIS — R0902 Hypoxemia: Secondary | ICD-10-CM | POA: Diagnosis present

## 2018-07-20 DIAGNOSIS — K219 Gastro-esophageal reflux disease without esophagitis: Secondary | ICD-10-CM | POA: Diagnosis present

## 2018-07-20 DIAGNOSIS — K625 Hemorrhage of anus and rectum: Secondary | ICD-10-CM

## 2018-07-20 DIAGNOSIS — Q2112 Patent foramen ovale: Secondary | ICD-10-CM

## 2018-07-20 DIAGNOSIS — K319 Disease of stomach and duodenum, unspecified: Secondary | ICD-10-CM | POA: Diagnosis present

## 2018-07-20 DIAGNOSIS — K2960 Other gastritis without bleeding: Secondary | ICD-10-CM | POA: Diagnosis not present

## 2018-07-20 DIAGNOSIS — I10 Essential (primary) hypertension: Secondary | ICD-10-CM | POA: Diagnosis not present

## 2018-07-20 DIAGNOSIS — K449 Diaphragmatic hernia without obstruction or gangrene: Secondary | ICD-10-CM | POA: Diagnosis present

## 2018-07-20 DIAGNOSIS — H409 Unspecified glaucoma: Secondary | ICD-10-CM | POA: Diagnosis present

## 2018-07-20 DIAGNOSIS — K259 Gastric ulcer, unspecified as acute or chronic, without hemorrhage or perforation: Secondary | ICD-10-CM | POA: Diagnosis not present

## 2018-07-20 DIAGNOSIS — Q211 Atrial septal defect: Secondary | ICD-10-CM | POA: Diagnosis not present

## 2018-07-20 DIAGNOSIS — Z7982 Long term (current) use of aspirin: Secondary | ICD-10-CM | POA: Diagnosis not present

## 2018-07-20 DIAGNOSIS — D500 Iron deficiency anemia secondary to blood loss (chronic): Secondary | ICD-10-CM | POA: Diagnosis not present

## 2018-07-20 DIAGNOSIS — K922 Gastrointestinal hemorrhage, unspecified: Secondary | ICD-10-CM | POA: Diagnosis not present

## 2018-07-20 DIAGNOSIS — Z9049 Acquired absence of other specified parts of digestive tract: Secondary | ICD-10-CM | POA: Diagnosis not present

## 2018-07-20 DIAGNOSIS — I509 Heart failure, unspecified: Secondary | ICD-10-CM | POA: Diagnosis not present

## 2018-07-20 DIAGNOSIS — E785 Hyperlipidemia, unspecified: Secondary | ICD-10-CM | POA: Diagnosis present

## 2018-07-20 DIAGNOSIS — D62 Acute posthemorrhagic anemia: Principal | ICD-10-CM | POA: Diagnosis present

## 2018-07-20 DIAGNOSIS — E876 Hypokalemia: Secondary | ICD-10-CM | POA: Diagnosis not present

## 2018-07-20 DIAGNOSIS — K3189 Other diseases of stomach and duodenum: Secondary | ICD-10-CM | POA: Diagnosis not present

## 2018-07-20 DIAGNOSIS — I340 Nonrheumatic mitral (valve) insufficiency: Secondary | ICD-10-CM | POA: Diagnosis not present

## 2018-07-20 LAB — FIBRIN DERIVATIVES D-DIMER (ARMC ONLY): Fibrin derivatives D-dimer (ARMC): 788.51 ng/mL (FEU) — ABNORMAL HIGH (ref 0.00–499.00)

## 2018-07-20 LAB — COMPREHENSIVE METABOLIC PANEL
ALT: 22 U/L (ref 0–44)
AST: 24 U/L (ref 15–41)
Albumin: 3.8 g/dL (ref 3.5–5.0)
Alkaline Phosphatase: 66 U/L (ref 38–126)
Anion gap: 9 (ref 5–15)
BUN: 19 mg/dL (ref 8–23)
CO2: 26 mmol/L (ref 22–32)
Calcium: 8.8 mg/dL — ABNORMAL LOW (ref 8.9–10.3)
Chloride: 109 mmol/L (ref 98–111)
Creatinine, Ser: 0.96 mg/dL (ref 0.44–1.00)
GFR calc Af Amer: >60 mL/min (ref >60)
GFR calc non Af Amer: 57 mL/min — ABNORMAL LOW (ref >60)
Glucose, Bld: 110 mg/dL — ABNORMAL HIGH (ref 70–99)
Potassium: 3.3 mmol/L — ABNORMAL LOW (ref 3.5–5.1)
Sodium: 144 mmol/L (ref 135–145)
Total Bilirubin: 0.7 mg/dL (ref 0.3–1.2)
Total Protein: 7.3 g/dL (ref 6.5–8.1)

## 2018-07-20 LAB — BRAIN NATRIURETIC PEPTIDE: B Natriuretic Peptide: 634 pg/mL — ABNORMAL HIGH (ref 0.0–100.0)

## 2018-07-20 LAB — CBC WITH DIFFERENTIAL/PLATELET
Abs Immature Granulocytes: 0.01 10*3/uL (ref 0.00–0.07)
Basophils Absolute: 0 10*3/uL (ref 0.0–0.1)
Basophils Relative: 1 %
Eosinophils Absolute: 0.2 10*3/uL (ref 0.0–0.5)
Eosinophils Relative: 3 %
HCT: 21.7 % — ABNORMAL LOW (ref 36.0–46.0)
Hemoglobin: 5.3 g/dL — ABNORMAL LOW (ref 12.0–15.0)
Immature Granulocytes: 0 %
Lymphocytes Relative: 30 %
Lymphs Abs: 1.7 10*3/uL (ref 0.7–4.0)
MCH: 15.5 pg — ABNORMAL LOW (ref 26.0–34.0)
MCHC: 24.4 g/dL — ABNORMAL LOW (ref 30.0–36.0)
MCV: 63.6 fL — ABNORMAL LOW (ref 80.0–100.0)
Monocytes Absolute: 0.6 10*3/uL (ref 0.1–1.0)
Monocytes Relative: 11 %
Neutro Abs: 3.1 10*3/uL (ref 1.7–7.7)
Neutrophils Relative %: 55 %
Platelets: 347 10*3/uL (ref 150–400)
RBC: 3.41 MIL/uL — ABNORMAL LOW (ref 3.87–5.11)
RDW: 22.4 % — ABNORMAL HIGH (ref 11.5–15.5)
WBC: 5.7 10*3/uL (ref 4.0–10.5)
nRBC: 0 % (ref 0.0–0.2)

## 2018-07-20 LAB — PREPARE RBC (CROSSMATCH)

## 2018-07-20 LAB — IRON AND TIBC
Iron: 16 ug/dL — ABNORMAL LOW (ref 28–170)
Saturation Ratios: 3 % — ABNORMAL LOW (ref 10.4–31.8)
TIBC: 509 ug/dL — ABNORMAL HIGH (ref 250–450)
UIBC: 493 ug/dL

## 2018-07-20 LAB — RETICULOCYTES
Immature Retic Fract: 34 % — ABNORMAL HIGH (ref 2.3–15.9)
RBC.: 3.25 MIL/uL — ABNORMAL LOW (ref 3.87–5.11)
Retic Count, Absolute: 87.1 10*3/uL (ref 19.0–186.0)
Retic Ct Pct: 2.7 % (ref 0.4–3.1)

## 2018-07-20 LAB — FOLATE: Folate: 38 ng/mL (ref >5.9)

## 2018-07-20 LAB — TROPONIN I: Troponin I: <0.03 ng/mL (ref <0.03)

## 2018-07-20 LAB — FERRITIN: Ferritin: 3 ng/mL — ABNORMAL LOW (ref 11–307)

## 2018-07-20 MED ORDER — SODIUM CHLORIDE 0.9% IV SOLUTION
Freq: Once | INTRAVENOUS | Status: AC
  Administered 2018-07-20: 17:00:00 via INTRAVENOUS
  Filled 2018-07-20: qty 250

## 2018-07-20 MED ORDER — PRAVASTATIN SODIUM 20 MG PO TABS
40.0000 mg | ORAL_TABLET | Freq: Every day | ORAL | Status: DC
  Administered 2018-07-20 – 2018-07-22 (×3): 40 mg via ORAL
  Filled 2018-07-20 (×3): qty 2

## 2018-07-20 MED ORDER — PANTOPRAZOLE SODIUM 40 MG IV SOLR
40.0000 mg | Freq: Two times a day (BID) | INTRAVENOUS | Status: DC
  Administered 2018-07-20 – 2018-07-22 (×5): 40 mg via INTRAVENOUS
  Filled 2018-07-20 (×5): qty 40

## 2018-07-20 MED ORDER — OMEGA-3-ACID ETHYL ESTERS 1 G PO CAPS
2.0000 g | ORAL_CAPSULE | Freq: Two times a day (BID) | ORAL | Status: DC
  Administered 2018-07-20 – 2018-07-22 (×4): 2 g via ORAL
  Filled 2018-07-20 (×4): qty 2

## 2018-07-20 MED ORDER — ALBUTEROL SULFATE (2.5 MG/3ML) 0.083% IN NEBU: 5.0000 mg | INHALATION_SOLUTION | Freq: Once | RESPIRATORY_TRACT | Status: DC

## 2018-07-20 MED ORDER — FUROSEMIDE 10 MG/ML IJ SOLN
20.0000 mg | Freq: Once | INTRAMUSCULAR | Status: AC
  Administered 2018-07-20: 20 mg via INTRAVENOUS
  Filled 2018-07-20: qty 4

## 2018-07-20 MED ORDER — VITAMIN D 25 MCG (1000 UNIT) PO TABS
2000.0000 [IU] | ORAL_TABLET | Freq: Every day | ORAL | Status: DC
  Administered 2018-07-20 – 2018-07-22 (×3): 2000 [IU] via ORAL
  Filled 2018-07-20 (×3): qty 2

## 2018-07-20 MED ORDER — MELATONIN 5 MG PO TABS
5.0000 mg | ORAL_TABLET | Freq: Every day | ORAL | Status: DC
  Administered 2018-07-20 – 2018-07-21 (×2): 5 mg via ORAL
  Filled 2018-07-20 (×3): qty 1

## 2018-07-20 MED ORDER — ADULT MULTIVITAMIN W/MINERALS CH
1.0000 | ORAL_TABLET | Freq: Every day | ORAL | Status: DC
  Administered 2018-07-21 – 2018-07-22 (×2): 1 via ORAL
  Filled 2018-07-20 (×2): qty 1

## 2018-07-20 MED ORDER — IPRATROPIUM-ALBUTEROL 0.5-2.5 (3) MG/3ML IN SOLN
RESPIRATORY_TRACT | Status: AC
  Filled 2018-07-20: qty 3

## 2018-07-20 MED ORDER — LISINOPRIL 20 MG PO TABS
20.0000 mg | ORAL_TABLET | Freq: Every day | ORAL | Status: DC
  Administered 2018-07-20 – 2018-07-22 (×3): 20 mg via ORAL
  Filled 2018-07-20 (×4): qty 1

## 2018-07-20 MED ORDER — ACETAMINOPHEN 650 MG RE SUPP: 650.0000 mg | Freq: Four times a day (QID) | RECTAL | Status: DC | PRN

## 2018-07-20 MED ORDER — LATANOPROST 0.005 % OP SOLN
1.0000 [drp] | Freq: Every day | OPHTHALMIC | Status: DC
  Administered 2018-07-20 – 2018-07-21 (×3): 1 [drp] via OPHTHALMIC
  Filled 2018-07-20: qty 2.5

## 2018-07-20 MED ORDER — VITAMIN B-12 1000 MCG PO TABS
1000.0000 ug | ORAL_TABLET | Freq: Every day | ORAL | Status: DC
  Administered 2018-07-20 – 2018-07-22 (×3): 1000 ug via ORAL
  Filled 2018-07-20 (×3): qty 1

## 2018-07-20 MED ORDER — TRAZODONE HCL 50 MG PO TABS
50.0000 mg | ORAL_TABLET | Freq: Every day | ORAL | Status: DC
  Administered 2018-07-20 – 2018-07-21 (×2): 50 mg via ORAL
  Filled 2018-07-20 (×2): qty 1

## 2018-07-20 MED ORDER — POTASSIUM CHLORIDE CRYS ER 20 MEQ PO TBCR
40.0000 meq | EXTENDED_RELEASE_TABLET | Freq: Once | ORAL | Status: AC
  Administered 2018-07-20: 40 meq via ORAL
  Filled 2018-07-20: qty 2

## 2018-07-20 MED ORDER — TURMERIC 500 MG PO CAPS: ORAL_CAPSULE | Freq: Every day | ORAL | Status: DC

## 2018-07-20 MED ORDER — ACETAMINOPHEN 325 MG PO TABS: 650.0000 mg | ORAL_TABLET | Freq: Four times a day (QID) | ORAL | Status: DC | PRN

## 2018-07-20 MED ORDER — POLYETHYLENE GLYCOL 3350 17 GM/SCOOP PO POWD
1.0000 | Freq: Once | ORAL | Status: AC
  Administered 2018-07-20: 255 g via ORAL
  Filled 2018-07-20: qty 255

## 2018-07-20 MED ORDER — FOLIC ACID 1 MG PO TABS
1.0000 mg | ORAL_TABLET | Freq: Every day | ORAL | Status: DC
  Administered 2018-07-20: 18:00:00 1 mg via ORAL
  Filled 2018-07-20 (×2): qty 1

## 2018-07-20 MED ORDER — ONDANSETRON HCL 4 MG/2ML IJ SOLN: 4.0000 mg | Freq: Four times a day (QID) | INTRAMUSCULAR | Status: DC | PRN

## 2018-07-20 MED ORDER — ONDANSETRON HCL 4 MG PO TABS: 4.0000 mg | ORAL_TABLET | Freq: Four times a day (QID) | ORAL | Status: DC | PRN

## 2018-07-20 MED ORDER — IPRATROPIUM-ALBUTEROL 0.5-2.5 (3) MG/3ML IN SOLN
3.0000 mL | Freq: Once | RESPIRATORY_TRACT | Status: AC
  Administered 2018-07-20: 3 mL via RESPIRATORY_TRACT

## 2018-07-20 MED ORDER — IPRATROPIUM-ALBUTEROL 0.5-2.5 (3) MG/3ML IN SOLN
3.0000 mL | Freq: Once | RESPIRATORY_TRACT | Status: AC
  Administered 2018-07-20: 3 mL via RESPIRATORY_TRACT
  Filled 2018-07-20: qty 3

## 2018-07-20 MED ORDER — CALCIUM CITRATE-VITAMIN D 500-500 MG-UNIT PO CHEW
1.0000 | CHEWABLE_TABLET | Freq: Two times a day (BID) | ORAL | Status: DC
  Administered 2018-07-20 – 2018-07-21 (×3): 1 via ORAL
  Filled 2018-07-20 (×5): qty 1

## 2018-07-20 MED ORDER — ESCITALOPRAM OXALATE 10 MG PO TABS
10.0000 mg | ORAL_TABLET | Freq: Every day | ORAL | Status: DC
  Administered 2018-07-20 – 2018-07-21 (×2): 10 mg via ORAL
  Filled 2018-07-20 (×3): qty 1

## 2018-07-20 MED ORDER — VERAPAMIL HCL ER 240 MG PO TBCR
240.0000 mg | EXTENDED_RELEASE_TABLET | Freq: Every day | ORAL | Status: DC
  Administered 2018-07-20 – 2018-07-21 (×2): 240 mg via ORAL
  Filled 2018-07-20 (×3): qty 1

## 2018-07-20 MED ORDER — DORZOLAMIDE HCL 2 % OP SOLN
1.0000 [drp] | Freq: Two times a day (BID) | OPHTHALMIC | Status: DC
  Administered 2018-07-20 – 2018-07-21 (×3): 1 [drp] via OPHTHALMIC
  Filled 2018-07-20: qty 10

## 2018-07-20 NOTE — Progress Notes (Signed)
Patient's hemoglobin is 5.3, resp. 24, requested cardiac monitor for the patient from Dr. Verdell Carmine, Dr. Verdell Carmine does not want a cardiac monitor at this time.

## 2018-07-20 NOTE — ED Notes (Signed)
Pt ambulated with pulse ox. Pt maintained O2 Sat level of 91-93%. Pt reported some dizziness with ambulation.

## 2018-07-20 NOTE — Consult Note (Addendum)
GI Inpatient Consult Note  Reason for Consult: Symptomatic anemia   Attending Requesting Consult: Dr. Abel Presto, MD  History of Present Illness: Virginia Walters is a 77 y.o. female seen for evaluation of symptomatic anemia at the request of Dr. Verdell Carmine. Pt has a PMH of hypertension, GERD, hyperlipidemia, hx of previous CVA, and patient foramen ovale. She presented to the ED today for 1-week history of progressive shortness of breath and weakness. In the ED, hemoglobin was found to be 5.3 and she was heme positive.  Pt seen and examined resting comfortably in ED stretcher. Her cousin is present with her in the room. Pt endorses 1-week history of progressive worsening shortness of breath and dizziness. She denies any recent changes in her bowel habits. She denies any bright red bloody or dark, tarry stools. She denies any nausea, vomiting, dysphagia, hematemesis, or early satiety. She denies unintentional weight loss. She reports colonoscopy three years ago which reportedly had adenomatous polyps. Mother with a hx of colon cancer dx in her 44s and father with hx of esophageal cancer. She reports she is due for surveillance colonoscopy now as she received letter in the mail this week that it was time to schedule. She is scheduled to receive 2 units of packed red blood cells. She also reportedly has new diagnosis of congestive heart failure as seen on CXR. She denies any chest pain, shortness of breath, or lower extremity edema. No prior hx of GI bleed or iron-deficiency anemia.   Last Colonoscopy: 3 years ago, report not available in EMR Last Endoscopy: N/A   Past Medical History:  Past Medical History:  Diagnosis Date  . Dyslipidemia   . Frequent falls   . GERD (gastroesophageal reflux disease)   . HTN (hypertension)   . Memory loss   . PFO (patent foramen ovale)   . Retinal artery occlusion   . Weakness     Problem List: Patient Active Problem List   Diagnosis Date Noted  .  Microcytic anemia 07/20/2018  . 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 Alcohol abuse 10/26/2016  . Memory loss 10/12/2016  . Gait abnormality 10/12/2016  . Orthostatic dizziness 10/12/2016  . Paresthesia 10/12/2016  . Abnormal EKG 12/28/2010  . HTN (hypertension)   . Dyslipidemia   . PFO (patent foramen ovale)   . Retinal artery occlusion   . GERD (gastroesophageal reflux disease)     Past Surgical History: Past Surgical History:  Procedure Laterality Date  . ANKLE ARTHROSCOPY WITH OPEN REDUCTION INTERNAL FIXATION (ORIF) Right   . APPENDECTOMY    . TUBAL LIGATION      Allergies: Allergies  Allergen Reactions  . Oxycodone Itching    Home Medications:  (Not in a hospital admission) Home medication reconciliation was completed with the patient.   Scheduled Inpatient Medications:   . sodium chloride   Intravenous Once  . pantoprazole (PROTONIX) IV  40 mg Intravenous Q12H  . potassium chloride  40 mEq Oral Once    Continuous Inpatient Infusions:    PRN Inpatient Medications:    Family History: family history includes Dementia in her mother; Esophageal cancer in her father.  The patient's family history is negative for inflammatory bowel disorders, GI malignancy, or solid organ transplantation.  Social History:   reports that she quit smoking about 37 years ago. Her smoking use included cigarettes. She has a 15.00 pack-year smoking history. She has never used smokeless tobacco. She reports that she drinks alcohol. She reports that she does  not use drugs. The patient denies ETOH, tobacco, or drug use.   Review of Systems: Constitutional: Weight is stable.  Eyes: No changes in vision. ENT: No oral lesions, sore throat.  GI: see HPI.  Heme/Lymph: No easy bruising.  CV: No chest pain.  GU: No hematuria.  Integumentary: No rashes.  Neuro: No headaches.  Psych: No depression/anxiety.  Endocrine: No heat/cold intolerance.  Allergic/Immunologic: No  urticaria.  Resp: No cough, SOB.  Musculoskeletal: No joint swelling.    Physical Examination: BP (!) 174/74 (BP Location: Right Arm)   Pulse 75   Resp 16   SpO2 94%  Gen: NAD, alert and oriented x 4 HEENT: PEERLA, EOMI, Neck: supple, no JVD or thyromegaly Chest: Wheezes bilaterally, course breath sounds, no accessory muscle use CV: RRR, no m/g/c/r Abd: soft, NT, ND, +BS in all four quadrants; no HSM, guarding, ridigity, or rebound tenderness Ext: no edema, well perfused with 2+ pulses, Skin: no rash or lesions noted Lymph: no LAD  Data: Lab Results  Component Value Date   WBC 5.7 07/20/2018   HGB 5.3 (L) 07/20/2018   HCT 21.7 (L) 07/20/2018   MCV 63.6 (L) 07/20/2018   PLT 347 07/20/2018   Recent Labs  Lab 07/20/18 1011  HGB 5.3*   Lab Results  Component Value Date   NA 144 07/20/2018   K 3.3 (L) 07/20/2018   CL 109 07/20/2018   CO2 26 07/20/2018   BUN 19 07/20/2018   CREATININE 0.96 07/20/2018   Lab Results  Component Value Date   ALT 22 07/20/2018   AST 24 07/20/2018   ALKPHOS 66 07/20/2018   BILITOT 0.7 07/20/2018   No results for input(s): APTT, INR, PTT in the last 168 hours. Assessment/Plan:  77 y/o Caucasian female with a PMH of hypertension, GERD, hyperlipidemia, hx of previous CVA admitted for symptomatic microcytic anemia with hemoglobin 5.3  1. Symptomatic microcytic anemia (hemoglobin 5.3, MCV 63) 2. Heme positive stool  - Pt admitted for severe microcytic anemia likely iron-deficiency anemia with hemoglobin 5.3, MCV 63. She is scheduled to receive 2 units PRBCs - Agree with IV Protonix - Hold aspirin and all anticoagulation/antiplatelets - Iron studies pending - Due to severe anemia and heme positive stool, pt would benefit from endoluminal evaluation with EGD and colonoscopy. - We discussed procedure details and indications and potential risks and complications, including bleeding, infection, small puncture to internal organs, or problems  with anesthesia. She consents to proceed. - Clear liquid diet today. NPO after midnight.  - Pt will complete miralax/gatorade bowel prep starting 1700 today - Plan for EGD/colonoscopy tomorrow with Dr. Marius Ditch - If EGD and colon are negative, pt would likely need small bowel capsule endoscopy    Thank you for the consult. Please call with questions or concerns.  Geanie Kenning, PA-C [facility]  [phone]

## 2018-07-20 NOTE — ED Notes (Signed)
Purwick placed for patient.

## 2018-07-20 NOTE — Plan of Care (Signed)

## 2018-07-20 NOTE — Progress Notes (Signed)
PHARMACIST - PHYSICIAN ORDER COMMUNICATION  CONCERNING: P&T Medication Policy on Herbal Medications  DESCRIPTION:  This patient's order for:  Tumeric  has been noted.  This product(s) is classified as an "herbal" or natural product. Due to a lack of definitive safety studies or FDA approval, nonstandard manufacturing practices, plus the potential risk of unknown drug-drug interactions while on inpatient medications, the Pharmacy and Therapeutics Committee does not permit the use of "herbal" or natural products of this type within [place].   ACTION TAKEN: The pharmacy department is unable to verify this order at this time and your patient has been informed of this safety policy. Please reevaluate patient's clinical condition at discharge and address if the herbal or natural product(s) should be resumed at that time.   

## 2018-07-20 NOTE — ED Triage Notes (Signed)
Pt presetns with [facility] since last Sunday. Pt is NAD but labored breathening is noticed.

## 2018-07-20 NOTE — ED Provider Notes (Signed)
[facility] Emergency Department Provider Note   ____________________________________________   First MD Initiated Contact with Patient 07/20/18 [phone]     (approximate)  I have reviewed the triage vital signs and the nursing notes.   HISTORY  Chief Complaint Shortness of Breath    HPI Ikia I Lobello is a 77 y.o. female who complains of increasing shortness of breath since Sunday.  She is not running a fever.  She has no chest pain.  She is not coughing up anything.  She is breathing hard.   Past Medical History:  Diagnosis Date  . Dyslipidemia   . Frequent falls   . GERD (gastroesophageal reflux disease)   . HTN (hypertension)   . Memory loss   . PFO (patent foramen ovale)   . Retinal artery occlusion   . Weakness     Patient Active Problem List   Diagnosis Date Noted  . 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 Alcohol abuse 10/26/2016  . Memory loss 10/12/2016  . Gait abnormality 10/12/2016  . Orthostatic dizziness 10/12/2016  . Paresthesia 10/12/2016  . Abnormal EKG 12/28/2010  . HTN (hypertension)   . Dyslipidemia   . PFO (patent foramen ovale)   . Retinal artery occlusion   . GERD (gastroesophageal reflux disease)     Past Surgical History:  Procedure Laterality Date  . ANKLE ARTHROSCOPY WITH OPEN REDUCTION INTERNAL FIXATION (ORIF) Right   . APPENDECTOMY    . TUBAL LIGATION      Prior to Admission medications   Medication Sig Start Date End Date Taking? Authorizing Provider  acamprosate (CAMPRAL) 333 MG tablet TAKE 1 TABLET BY MOUTH 3  TIMES DAILY WITH MEALS Patient not taking: Reported on 02/16/2018 10/16/17   Susy Frizzle, MD  aspirin EC 81 MG tablet Take 1 tablet (81 mg total) by mouth daily. 08/16/17   Susy Frizzle, MD  B Complex-Biotin-FA (B-COMPLEX PO) Take 1 capsule by mouth daily.     [provider]  Calcium Carbonate-Vitamin D (CALCIUM + D PO) Take 1 capsule by mouth 2 (two) times daily.      [provider]  Cholecalciferol (VITAMIN D) 2000 UNITS CAPS Take 1 capsule by mouth daily.     [provider]  dorzolamide (TRUSOPT) 2 % ophthalmic solution 1 drop 2 (two) times daily.      [provider]  escitalopram (LEXAPRO) 10 MG tablet TAKE 1 TABLET BY MOUTH  DAILY 05/07/18   Susy Frizzle, MD  fish oil-omega-3 fatty acids 1000 MG capsule Take 2 g by mouth 2 (two) times daily.      [provider]  folic acid (FOLVITE) 1 MG tablet Take 1 mg by mouth daily.    [provider]  latanoprost (XALATAN) 0.005 % ophthalmic solution  12/05/17   [provider]  lisinopril (PRINIVIL,ZESTRIL) 20 MG tablet Take 1 tablet (20 mg total) by mouth daily. 09/04/17   Susy Frizzle, MD  MELATONIN PO Take 5 mg by mouth at bedtime.     [provider]  mirabegron ER (MYRBETRIQ) 25 MG TB24 tablet Take 1 tablet (25 mg total) by mouth daily. Patient not taking: Reported on 02/16/2018 09/21/17   Susy Frizzle, MD  mometasone (ELOCON) 0.1 % ointment Apply topically daily. 02/16/18   Susy Frizzle, MD  Multiple Vitamin (MULTIVITAMIN) tablet Take 1 tablet by mouth daily.      [provider]  pravastatin (PRAVACHOL) 40 MG tablet TAKE 1  TABLET BY MOUTH  DAILY 05/07/18   Susy Frizzle, MD  traZODone (DESYREL) 50 MG tablet TAKE 1 TABLET BY MOUTH AT  BEDTIME 10/16/17   Susy Frizzle, MD  traZODone (DESYREL) 50 MG tablet TAKE 1 TABLET BY MOUTH AT  BEDTIME 05/07/18   Susy Frizzle, MD  TURMERIC PO Take 1 capsule by mouth daily.    [provider]  verapamil (CALAN-SR) 240 MG CR tablet TAKE 1 TABLET BY MOUTH AT  BEDTIME 01/29/18   Susy Frizzle, MD  vitamin B-12 (CYANOCOBALAMIN) 1000 MCG tablet Take 1,000 mcg by mouth daily.    [provider]  VITAMIN E PO Take 180 mg by mouth daily.     [provider]    Allergies Oxycodone  Family History  Problem Relation Age of Onset  . Esophageal cancer  Father   . Dementia Mother     Social History Social History   Tobacco Use  . Smoking status: Former Smoker    Last attempt to quit: 12/27/1980    Years since quitting: 37.5  . Smokeless tobacco: Never Used  Substance Use Topics  . Alcohol use: Yes    Comment: occassionally  . Drug use: No    Review of Systems  Constitutional: No fever/chills Eyes: No visual changes. ENT: No sore throat. Cardiovascular: Denies chest pain. Respiratory:  shortness of breath. Gastrointestinal: No abdominal pain.  No nausea, no vomiting.  No diarrhea.  No constipation. Genitourinary: Negative for dysuria. Musculoskeletal: Negative for back pain. Skin: Negative for rash. Neurological: Negative for headaches, focal weakness  ____________________________________________   PHYSICAL EXAM:  VITAL SIGNS: ED Triage Vitals [07/20/18 0950]  Enc Vitals Group     BP (!) 193/65     Pulse Rate 69     Resp 16     Temp      Temp src      SpO2 93 %     Weight      Height      Head Circumference      Peak Flow      Pain Score      Pain Loc      Pain Edu?      Excl. in [place]?     Constitutional: Alert and oriented.  sHe is breathing hard Eyes: Conjunctivae are normal.  Head: Atraumatic. Nose: No congestion/rhinnorhea. Mouth/Throat: Mucous membranes are moist.  Oropharynx non-erythematous. Neck: No stridor.  Cardiovascular: Normal rate, regular rhythm. Grossly normal heart sounds.  Good peripheral circulation. Respiratory: Increased respiratory effort.  No retractions. Lungs CTAB. Gastrointestinal: Soft and nontender. No distention. No abdominal bruits. No CVA tenderness. Musculoskeletal: No lower extremity tenderness  1+ edema.   Neurologic:  Normal speech and language. No gross focal neurologic deficits are appreciated. Skin:  Skin is warm, dry and intact. No rash noted. Rectal: Rectal no hemorrhoids stool however is Hemoccult positive ____________________________________________    LABS (all labs ordered are listed, but only abnormal results are displayed)  Labs Reviewed  COMPREHENSIVE METABOLIC PANEL - Abnormal; Notable for the following components:      Result Value   Potassium 3.3 (*)    Glucose, Bld 110 (*)    Calcium 8.8 (*)    GFR calc non Af Amer 57 (*)    All other components within normal limits  BRAIN NATRIURETIC PEPTIDE - Abnormal; Notable for the following components:   B Natriuretic Peptide 634.0 (*)    All other components within normal limits  CBC WITH DIFFERENTIAL/PLATELET -  Abnormal; Notable for the following components:   RBC 3.41 (*)    Hemoglobin 5.3 (*)    HCT 21.7 (*)    MCV 63.6 (*)    MCH 15.5 (*)    MCHC 24.4 (*)    RDW 22.4 (*)    All other components within normal limits  FIBRIN DERIVATIVES D-DIMER ([facility] ONLY) - Abnormal; Notable for the following components:   Fibrin derivatives D-dimer [facility]) 788.51 (*)    All other components within normal limits  TROPONIN I   ____________________________________________  EKG  EKG read and interpreted by me shows sinus rhythm at a rate of 74 normal axis no acute ST-T wave changes.  Is reading a prolonged QT interval but QTC is calculated by computer at 546 ms ____________________________________________  RADIOLOGY  ED MD interpretation: Radiology reads the film is pulmonary vascular congestion.  I reviewed the film  Official radiology report(s): Dg Chest 2 View  Result Date: 07/20/2018 CLINICAL DATA:  Acute shortness of breath for 1 week. EXAM: CHEST - 2 VIEW COMPARISON:  10/25/2016 FINDINGS: UPPER limits normal heart size noted. Pulmonary vascular congestion identified with a trace RIGHT pleural effusion. No definite airspace disease noted. No pulmonary mass, pneumothorax or acute bony abnormality. IMPRESSION: Pulmonary vascular congestion with trace RIGHT pleural effusion. Electronically Signed   By: Margarette Canada M.D.   On: 07/20/2018 10:14     ____________________________________________   PROCEDURES  Procedure(s) performed:   Procedures  Critical Care performed:  ____________________________________________   INITIAL IMPRESSION / ASSESSMENT AND PLAN / ED COURSE  Patient is severely anemic additionally she has what appears to be new onset congestive heart failure.  She does not desaturate I have her these 2 problems are enough to admit her discussed this with hospitalist and he agrees.  Additionally she will require work-up of her Hemoccult positivity         ____________________________________________   FINAL CLINICAL IMPRESSION(S) / ED DIAGNOSES  Final diagnoses:  Systolic congestive heart failure, unspecified HF chronicity (HCC)  Anemia, unspecified type  Rectal bleeding     ED Discharge Orders    None       Note:  This document was prepared using Dragon voice recognition software and may include unintentional dictation errors.    Nena Polio, MD 07/20/18 1230

## 2018-07-20 NOTE — H&P (Signed)
[facility] [place] NAME: Virginia Walters    MR#:  [id number]  DATE OF BIRTH:  11-16-1940  DATE OF ADMISSION:  07/20/2018  PRIMARY CARE PHYSICIAN: Susy Frizzle, MD   REQUESTING/REFERRING PHYSICIAN: Dr. Conni Slipper  CHIEF COMPLAINT:   Chief Complaint  Patient presents with  . Shortness of Breath    HISTORY OF PRESENT ILLNESS:  Virginia Walters  is a 77 y.o. female with a known history of hypertension, GERD, hyperlipidemia, history of previous CVA, history of PFO who presents to the hospital due to shortness of breath and weakness.  Patient says she has been having worsening shortness of breath and dizziness now ongoing for the past week or so progressively getting worse.  She says she is tries to ambulate and gets lightheaded and dizzy and feels like she can get her breath.  She denies any chest pain associated with this.  She denies any fevers chills, cough or congestion.  She denies any paroxysmal nocturnal dyspnea orthopnea or any worsening lower extremity edema.  She denies also any melena, hematochezia, hematuria or hemoptysis.  She presents to the ER and was noted to be significantly anemic with a hemoglobin of 5.3, her chest x-ray findings were also suggestive of mild pulmonary edema with vascular congestion.  She was ambulated in the ER was not hypoxic but was symptomatic and therefore hospitalist services were contacted for admission.  PAST MEDICAL HISTORY:   Past Medical History:  Diagnosis Date  . Dyslipidemia   . Frequent falls   . GERD (gastroesophageal reflux disease)   . HTN (hypertension)   . Memory loss   . PFO (patent foramen ovale)   . Retinal artery occlusion   . Weakness     PAST SURGICAL HISTORY:   Past Surgical History:  Procedure Laterality Date  . ANKLE ARTHROSCOPY WITH OPEN REDUCTION INTERNAL FIXATION (ORIF) Right   . APPENDECTOMY    . TUBAL LIGATION      SOCIAL HISTORY:   Social History    Tobacco Use  . Smoking status: Former Smoker    Packs/day: 1.00    Years: 15.00    Pack years: 15.00    Types: Cigarettes    Last attempt to quit: 12/27/1980    Years since quitting: 37.5  . Smokeless tobacco: Never Used  Substance Use Topics  . Alcohol use: Yes    Comment: occassionally    FAMILY HISTORY:   Family History  Problem Relation Age of Onset  . Esophageal cancer Father   . Dementia Mother     DRUG ALLERGIES:   Allergies  Allergen Reactions  . Oxycodone Itching    REVIEW OF SYSTEMS:   Review of Systems  Constitutional: Negative for fever and weight loss.  HENT: Negative for congestion, nosebleeds and tinnitus.   Eyes: Negative for blurred vision, double vision and redness.  Respiratory: Positive for shortness of breath. Negative for cough and hemoptysis.   Cardiovascular: Negative for chest pain, orthopnea, leg swelling and PND.  Gastrointestinal: Positive for blood in stool. Negative for abdominal pain, diarrhea, melena, nausea and vomiting.  Genitourinary: Negative for dysuria, hematuria and urgency.  Musculoskeletal: Negative for falls and joint pain.  Neurological: Positive for weakness (Generalized). Negative for dizziness, tingling, sensory change, focal weakness, seizures and headaches.  Endo/Heme/Allergies: Negative for polydipsia. Does not bruise/bleed easily.  Psychiatric/Behavioral: Negative for depression and memory loss. The patient is not nervous/anxious.     MEDICATIONS AT HOME:   Prior  to Admission medications   Medication Sig Start Date End Date Taking? Authorizing Provider  acamprosate (CAMPRAL) 333 MG tablet TAKE 1 TABLET BY MOUTH 3  TIMES DAILY WITH MEALS Patient taking differently: Take 333 mg by mouth 3 (three) times daily.  10/16/17  Yes Susy Frizzle, MD  aspirin EC 81 MG tablet Take 1 tablet (81 mg total) by mouth daily. 08/16/17  Yes Susy Frizzle, MD  B Complex-Biotin-FA (B-COMPLEX PO) Take 1 capsule by mouth daily.     Yes [provider]  Calcium Carbonate-Vitamin D (CALCIUM + D PO) Take 1 capsule by mouth 2 (two) times daily.    Yes [provider]  Cholecalciferol (VITAMIN D) 2000 UNITS CAPS Take 1 capsule by mouth daily.    Yes [provider]  escitalopram (LEXAPRO) 10 MG tablet TAKE 1 TABLET BY MOUTH  DAILY Patient taking differently: Take 10 mg by mouth daily.  05/07/18  Yes Susy Frizzle, MD  fish oil-omega-3 fatty acids 1000 MG capsule Take 2 g by mouth 2 (two) times daily.     Yes [provider]  folic acid (FOLVITE) 1 MG tablet Take 1 mg by mouth daily.   Yes [provider]  latanoprost (XALATAN) 0.005 % ophthalmic solution Place 1 drop into the left eye at bedtime.  12/05/17  Yes [provider]  lisinopril (PRINIVIL,ZESTRIL) 20 MG tablet Take 1 tablet (20 mg total) by mouth daily. 09/04/17  Yes Susy Frizzle, MD  MELATONIN PO Take 5 mg by mouth at bedtime.    Yes [provider]  Multiple Vitamin (MULTIVITAMIN) tablet Take 1 tablet by mouth daily.     Yes [provider]  pravastatin (PRAVACHOL) 40 MG tablet TAKE 1 TABLET BY MOUTH  DAILY Patient taking differently: Take 40 mg by mouth daily.  05/07/18  Yes Susy Frizzle, MD  traZODone (DESYREL) 50 MG tablet TAKE 1 TABLET BY MOUTH AT  BEDTIME Patient taking differently: Take 50 mg by mouth at bedtime.  05/07/18  Yes Susy Frizzle, MD  TURMERIC PO Take 1 capsule by mouth daily.   Yes [provider]  verapamil (CALAN-SR) 240 MG CR tablet TAKE 1 TABLET BY MOUTH AT  BEDTIME Patient taking differently: Take 240 mg by mouth at bedtime.  01/29/18  Yes Susy Frizzle, MD  vitamin B-12 (CYANOCOBALAMIN) 1000 MCG tablet Take 1,000 mcg by mouth daily.   Yes [provider]  VITAMIN E PO Take 180 mg by mouth daily.    Yes [provider]  dorzolamide (TRUSOPT) 2 % ophthalmic solution Place 1 drop into the left eye 2 (two) times daily.     [provider]  mirabegron ER (MYRBETRIQ) 25 MG TB24 tablet Take 1 tablet (25 mg total) by mouth daily. Patient not taking: Reported on 02/16/2018 09/21/17   Susy Frizzle, MD  mometasone (ELOCON) 0.1 % ointment Apply topically daily. 02/16/18   Susy Frizzle, MD      VITAL SIGNS:  Blood pressure (!) 193/65, pulse 70, resp. rate 17, SpO2 100 %.  PHYSICAL EXAMINATION:  Physical Exam  GENERAL:  77 y.o.-year-old patient lying in the bed in no acute distress.  EYES: Pupils equal, round, reactive to light and accommodation. No scleral icterus. Extraocular muscles intact. Pale Conjunctiva.   HEENT: Head atraumatic, normocephalic. Oropharynx and nasopharynx clear. No oropharyngeal erythema, moist oral mucosa  NECK:  Supple, no jugular venous distention. No thyroid enlargement, no tenderness.  LUNGS: Normal breath  sounds bilaterally, no wheezing, rales, rhonchi. No use of accessory muscles of respiration.  CARDIOVASCULAR: S1, S2 RRR. No murmurs, rubs, gallops, clicks.  ABDOMEN: Soft, nontender, nondistended. Bowel sounds present. No organomegaly or mass.  EXTREMITIES: + 1 edema b/l, No cyanosis, or clubbing. + 2 pedal & radial pulses b/l.   NEUROLOGIC: Cranial nerves II through XII are intact. No focal Motor or sensory deficits appreciated b/l. Globally weak.  PSYCHIATRIC: The patient is alert and oriented x 3.  SKIN: No obvious rash, lesion, or ulcer.   LABORATORY PANEL:   CBC Recent Labs  Lab 07/20/18 1011  WBC 5.7  HGB 5.3*  HCT 21.7*  PLT 347   ------------------------------------------------------------------------------------------------------------------  Chemistries  Recent Labs  Lab 07/20/18 1011  NA 144  K 3.3*  CL 109  CO2 26  GLUCOSE 110*  BUN 19  CREATININE 0.96  CALCIUM 8.8*  AST 24  ALT 22  ALKPHOS 66  BILITOT 0.7   ------------------------------------------------------------------------------------------------------------------  Cardiac  Enzymes Recent Labs  Lab 07/20/18 1011  TROPONINI <0.03   ------------------------------------------------------------------------------------------------------------------  RADIOLOGY:  Dg Chest 2 View  Result Date: 07/20/2018 CLINICAL DATA:  Acute shortness of breath for 1 week. EXAM: CHEST - 2 VIEW COMPARISON:  10/25/2016 FINDINGS: UPPER limits normal heart size noted. Pulmonary vascular congestion identified with a trace RIGHT pleural effusion. No definite airspace disease noted. No pulmonary mass, pneumothorax or acute bony abnormality. IMPRESSION: Pulmonary vascular congestion with trace RIGHT pleural effusion. Electronically Signed   By: Margarette Canada M.D.   On: 07/20/2018 10:14     IMPRESSION AND PLAN:   76 year old female with past medical history of previous CVA, PFO, hypertension, GERD, hyperlipidemia who presents to the hospital due to shortness of breath on exertion and dizziness.  1.  Shortness of breath- secondary to significant microcytic anemia complicated with some mild CHF. - We will transfuse the patient 2 units of packed red blood cells, patient received 1 dose of IV Lasix in the ER. - Follow clinically.  2.  Microcytic anemia- suspected to be severe iron deficiency.  Patient is also heme positive as per the rectal exam by the ER physician. - We will check iron studies, B12, folate, reticulocyte count.  Will transfuse patient 2 units of packed red blood cells, follow hemoglobin. - We will also get a gastroenterology consult.  3.  GI bleed-patient is here with significant anemia with heme positive stools. -Continue care as mentioned above, will get gastroenterology consult.  Placed on IV Protonix, hold aspirin, placed on clear liquid diet.  4.  History of previous CVA with PFO-hold aspirin given the significant anemia.  5.  Essential hypertension-continue lisinopril, verapamil.  6.  Depression-continue Lexapro.  7.  Glaucoma-continue latanoprost, Trusopt  eyedrops.  8.  Hyperlipidemia-continue Pravachol.    All the records are reviewed and case discussed with ED provider. Management plans discussed with the patient, family and they are in agreement.  CODE STATUS: Full code  TOTAL TIME TAKING CARE OF THIS PATIENT: 45 minutes.    Henreitta Leber M.D on 07/20/2018 at 1:23 PM  Between 7am to 6pm - Pager - [phone]  After 6pm go to [email or web address] - password [email or web address] [facility]  [place] [facility]  Office  [phone]  CC: Primary care physician; Susy Frizzle, MD

## 2018-07-21 ENCOUNTER — Inpatient Hospital Stay: Admit: 2018-07-21 | Payer: Medicare Other

## 2018-07-21 ENCOUNTER — Encounter
Admission: EM | Disposition: A | Discharge status: Home Health | Payer: Self-pay | Source: Home / Self Care | Attending: Internal Medicine

## 2018-07-21 ENCOUNTER — Inpatient Hospital Stay: Payer: Medicare Other | Admitting: Anesthesiology

## 2018-07-21 ENCOUNTER — Inpatient Hospital Stay: Payer: Medicare Other

## 2018-07-21 ENCOUNTER — Encounter: Payer: Self-pay | Admitting: *Deleted

## 2018-07-21 DIAGNOSIS — K259 Gastric ulcer, unspecified as acute or chronic, without hemorrhage or perforation: Secondary | ICD-10-CM

## 2018-07-21 DIAGNOSIS — K449 Diaphragmatic hernia without obstruction or gangrene: Secondary | ICD-10-CM

## 2018-07-21 DIAGNOSIS — D500 Iron deficiency anemia secondary to blood loss (chronic): Secondary | ICD-10-CM

## 2018-07-21 DIAGNOSIS — D649 Anemia, unspecified: Secondary | ICD-10-CM | POA: Diagnosis not present

## 2018-07-21 DIAGNOSIS — K3189 Other diseases of stomach and duodenum: Secondary | ICD-10-CM

## 2018-07-21 DIAGNOSIS — I10 Essential (primary) hypertension: Secondary | ICD-10-CM | POA: Diagnosis not present

## 2018-07-21 DIAGNOSIS — R195 Other fecal abnormalities: Secondary | ICD-10-CM | POA: Diagnosis not present

## 2018-07-21 DIAGNOSIS — K279 Peptic ulcer, site unspecified, unspecified as acute or chronic, without hemorrhage or perforation: Secondary | ICD-10-CM

## 2018-07-21 HISTORY — PX: COLONOSCOPY: SHX5424

## 2018-07-21 HISTORY — PX: ESOPHAGOGASTRODUODENOSCOPY: SHX5428

## 2018-07-21 LAB — VITAMIN B12: Vitamin B-12: 2595 pg/mL — ABNORMAL HIGH (ref 180–914)

## 2018-07-21 LAB — TYPE AND SCREEN
ABO/RH(D): A POS
Antibody Screen: NEGATIVE
Unit division: 0
Unit division: 0

## 2018-07-21 LAB — BPAM RBC
Blood Product Expiration Date: 201912082359
Blood Product Expiration Date: 201912112359
ISSUE DATE / TIME: 201912061647
ISSUE DATE / TIME: 201912062340
Unit Type and Rh: 600
Unit Type and Rh: 9500

## 2018-07-21 LAB — BASIC METABOLIC PANEL
Anion gap: 8 (ref 5–15)
BUN: 15 mg/dL (ref 8–23)
CO2: 27 mmol/L (ref 22–32)
Calcium: 9.2 mg/dL (ref 8.9–10.3)
Chloride: 109 mmol/L (ref 98–111)
Creatinine, Ser: 0.98 mg/dL (ref 0.44–1.00)
GFR calc Af Amer: >60 mL/min (ref >60)
GFR calc non Af Amer: 56 mL/min — ABNORMAL LOW (ref >60)
Glucose, Bld: 97 mg/dL (ref 70–99)
Potassium: 3.2 mmol/L — ABNORMAL LOW (ref 3.5–5.1)
Sodium: 144 mmol/L (ref 135–145)

## 2018-07-21 LAB — CBC
HCT: 26.7 % — ABNORMAL LOW (ref 36.0–46.0)
Hemoglobin: 7.6 g/dL — ABNORMAL LOW (ref 12.0–15.0)
MCH: 18.8 pg — ABNORMAL LOW (ref 26.0–34.0)
MCHC: 28.5 g/dL — ABNORMAL LOW (ref 30.0–36.0)
MCV: 66.1 fL — ABNORMAL LOW (ref 80.0–100.0)
Platelets: 324 10*3/uL (ref 150–400)
RBC: 4.04 MIL/uL (ref 3.87–5.11)
RDW: 25.4 % — ABNORMAL HIGH (ref 11.5–15.5)
WBC: 7.5 10*3/uL (ref 4.0–10.5)
nRBC: 0.7 % — ABNORMAL HIGH (ref 0.0–0.2)

## 2018-07-21 LAB — MAGNESIUM: Magnesium: 2 mg/dL (ref 1.7–2.4)

## 2018-07-21 SURGERY — EGD (ESOPHAGOGASTRODUODENOSCOPY)
Anesthesia: General

## 2018-07-21 MED ORDER — SODIUM CHLORIDE 0.9 % IV SOLN: INTRAVENOUS | Status: DC

## 2018-07-21 MED ORDER — GLYCOPYRROLATE 0.2 MG/ML IJ SOLN
INTRAMUSCULAR | Status: DC | PRN
  Administered 2018-07-21: 0.2 mg via INTRAVENOUS

## 2018-07-21 MED ORDER — FUROSEMIDE 10 MG/ML IJ SOLN
60.0000 mg | Freq: Once | INTRAMUSCULAR | Status: AC
  Administered 2018-07-21: 22:00:00 60 mg via INTRAVENOUS
  Filled 2018-07-21: qty 6

## 2018-07-21 MED ORDER — PROPOFOL 500 MG/50ML IV EMUL
INTRAVENOUS | Status: AC
  Filled 2018-07-21: qty 50

## 2018-07-21 MED ORDER — LIDOCAINE HCL (PF) 2 % IJ SOLN
INTRAMUSCULAR | Status: AC
  Filled 2018-07-21: qty 10

## 2018-07-21 MED ORDER — SODIUM CHLORIDE 0.9 % IV SOLN
INTRAVENOUS | Status: DC
  Administered 2018-07-21: 10:00:00 via INTRAVENOUS

## 2018-07-21 MED ORDER — PROPOFOL 10 MG/ML IV BOLUS
INTRAVENOUS | Status: DC | PRN
  Administered 2018-07-21: 60 mg via INTRAVENOUS

## 2018-07-21 MED ORDER — IPRATROPIUM-ALBUTEROL 0.5-2.5 (3) MG/3ML IN SOLN
3.0000 mL | Freq: Four times a day (QID) | RESPIRATORY_TRACT | Status: DC
  Administered 2018-07-21 – 2018-07-22 (×4): 3 mL via RESPIRATORY_TRACT
  Filled 2018-07-21 (×4): qty 3

## 2018-07-21 MED ORDER — POTASSIUM CHLORIDE CRYS ER 20 MEQ PO TBCR
40.0000 meq | EXTENDED_RELEASE_TABLET | Freq: Two times a day (BID) | ORAL | Status: AC
  Administered 2018-07-21 (×2): 40 meq via ORAL
  Filled 2018-07-21 (×2): qty 2

## 2018-07-21 MED ORDER — SODIUM CHLORIDE 0.9 % IV SOLN
500.0000 mg | Freq: Once | INTRAVENOUS | Status: AC
  Administered 2018-07-21: 13:00:00 500 mg via INTRAVENOUS
  Filled 2018-07-21: qty 25

## 2018-07-21 MED ORDER — IPRATROPIUM-ALBUTEROL 0.5-2.5 (3) MG/3ML IN SOLN
RESPIRATORY_TRACT | Status: AC
  Administered 2018-07-21: 3 mL via RESPIRATORY_TRACT
  Filled 2018-07-21: qty 3

## 2018-07-21 MED ORDER — LIDOCAINE 2% (20 MG/ML) 5 ML SYRINGE
INTRAMUSCULAR | Status: DC | PRN
  Administered 2018-07-21: 100 mg via INTRAVENOUS

## 2018-07-21 MED ORDER — GLYCOPYRROLATE 0.2 MG/ML IJ SOLN
INTRAMUSCULAR | Status: AC
  Filled 2018-07-21: qty 1

## 2018-07-21 MED ORDER — PROPOFOL 500 MG/50ML IV EMUL
INTRAVENOUS | Status: DC | PRN
  Administered 2018-07-21: 150 ug/kg/min via INTRAVENOUS

## 2018-07-21 NOTE — Anesthesia Preprocedure Evaluation (Signed)
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  Past Medical History: No date: Dyslipidemia No date: Frequent falls No date: GERD (gastroesophageal reflux disease) No date: HTN (hypertension) No date: Memory loss No date: PFO (patent foramen ovale) No date: Retinal artery occlusion No date: Weakness  Past Surgical History: No date: ANKLE ARTHROSCOPY WITH OPEN REDUCTION INTERNAL FIXATION  (ORIF); Right No date: APPENDECTOMY No date: TUBAL LIGATION     Reproductive/Obstetrics negative OB ROS                             Anesthesia Physical Anesthesia Plan  ASA: III  Anesthesia Plan: General   Post-op Pain Management:    Induction: Intravenous  PONV Risk Score and Plan: Propofol infusion and TIVA  Airway Management Planned: Natural Airway and Nasal Cannula  Additional Equipment:   Intra-op Plan:   Post-operative Plan:   Informed Consent: I have reviewed the patients History and Physical, chart,  labs and discussed the procedure including the risks, benefits and alternatives for the proposed anesthesia with the patient or authorized representative who has indicated his/her understanding and acceptance.   Dental Advisory Given  Plan Discussed with: Anesthesiologist, CRNA and Surgeon  Anesthesia Plan Comments: (Patient consented for risks of anesthesia including but not limited to:  - adverse reactions to medications - risk of intubation if required - damage to teeth, lips or other oral mucosa - sore throat or hoarseness - Damage to heart, brain, lungs or loss of life  Patient voiced understanding.)        Anesthesia Quick Evaluation

## 2018-07-21 NOTE — Consult Note (Signed)
Pharmacy consulted to manage electrolytes. K=3.2, Mg 2 Will give KCL 40 MEW x 2 doses  Labs in AM  [employer], Pharm.D, BCPS Clinical Pharmacist

## 2018-07-21 NOTE — Op Note (Signed)
[facility] Patient Name: Virginia Walters Procedure Date: 07/21/2018 10:44 AM MRN: [id number] Account #: [id number] Date of Birth: 05-Mar-1941 Admit Type: Inpatient Age: 77 Room: [facility] ENDO ROOM 4 Gender: Female Note Status: Finalized Procedure:            Upper GI endoscopy Indications:          Iron deficiency anemia secondary to chronic blood loss Providers:            Lin Landsman MD, MD Referring MD:         Cammie Mcgee. Pickard (Referring MD) Medicines:            Monitored Anesthesia Care Complications:        No immediate complications. Estimated blood loss: None. Procedure:            Pre-Anesthesia Assessment:                       - Prior to the procedure, a History and Physical was                        performed, and patient medications and allergies were                        reviewed. The patient is competent. The risks and                        benefits of the procedure and the sedation options and                        risks were discussed with the patient. All questions                        were answered and informed consent was obtained.                        Patient identification and proposed procedure were                        verified by the physician, the nurse, the                        anesthesiologist, the anesthetist and the technician in                        the pre-procedure area in the procedure room in the                        endoscopy suite. Mental Status Examination: alert and                        oriented. Airway Examination: normal oropharyngeal                        airway and neck mobility. Respiratory Examination:                        clear to auscultation. CV Examination: normal.                        Prophylactic Antibiotics: The patient does not require  prophylactic antibiotics. Prior Anticoagulants: The                        patient has taken no previous  anticoagulant or                        antiplatelet agents. ASA Grade Assessment: III - A                        patient with severe systemic disease. After reviewing                        the risks and benefits, the patient was deemed in                        satisfactory condition to undergo the procedure. The                        anesthesia plan was to use monitored anesthesia care                        (MAC). Immediately prior to administration of                        medications, the patient was re-assessed for adequacy                        to receive sedatives. The heart rate, respiratory rate,                        oxygen saturations, blood pressure, adequacy of                        pulmonary ventilation, and response to care were                        monitored throughout the procedure. The physical status                        of the patient was re-assessed after the procedure.                       After obtaining informed consent, the endoscope was                        passed under direct vision. Throughout the procedure,                        the patient's blood pressure, pulse, and oxygen                        saturations were monitored continuously. The Endoscope                        was introduced through the mouth, and advanced to the                        second part of duodenum. The upper GI endoscopy was  accomplished without difficulty. The patient tolerated                        the procedure fairly well. Findings:      The duodenal bulb and second portion of the duodenum were normal.      Many non-bleeding superficial gastric ulcers with a clean ulcer base       (Forrest Class III) were found in the gastric antrum. The largest lesion       was 6 mm in largest dimension.      Multiple dispersed, 10 mm non-bleeding erosions were found in the       gastric body. There were no stigmata of recent bleeding. Biopsies were        taken with a cold forceps for Helicobacter pylori testing.      A medium-sized hiatal hernia with a few Cameron ulcers was found.      The gastroesophageal junction and examined esophagus were normal. Impression:           - Normal duodenal bulb and second portion of the                        duodenum.                       - Non-bleeding gastric ulcers with a clean ulcer base                        (Forrest Class III). Likely source of IDA                       - Non-bleeding erosive gastropathy,likely source of IDA.                       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                       - Normal gastroesophageal junction and esophagus.                       - No specimens collected. Recommendation:       - Await pathology results.                       - Use Prilosec (omeprazole) 40 mg PO BID for 6 months.                       - Proceed with colonoscopy as scheduled                       See colonoscopy report Procedure Code(s):    --- Professional ---                       [phone], Esophagogastroduodenoscopy, flexible, transoral;                        with biopsy, single or multiple Diagnosis Code(s):    --- Professional ---                       K25.9, Gastric ulcer, unspecified as acute or chronic,  without hemorrhage or perforation                       K31.89, Other diseases of stomach and duodenum                       K44.9, Diaphragmatic hernia without obstruction or                        gangrene                       D50.0, Iron deficiency anemia secondary to blood loss                        (chronic) CPT copyright 2018 American Medical Association. All rights reserved. The codes documented in this report are preliminary and upon coder review may  be revised to meet current compliance requirements. Dr. Ulyess Mort Lin Landsman MD, MD 07/21/2018 11:07:07 AM This report has been signed electronically. Number of  Addenda: 0 Note Initiated On: 07/21/2018 10:44 AM      [facility]

## 2018-07-21 NOTE — Progress Notes (Addendum)
[place] at [place] NAME: Virginia Walters    MR#:  [id number]  DATE OF BIRTH:  Aug 01, 1941  SUBJECTIVE:   Patient seen in endo suite post EGD showing ulcers and erosions Patient sleeping   REVIEW OF SYSTEMS:    Patient still sedated from meds for EGD  Tolerating Diet:npo      DRUG ALLERGIES:   Allergies  Allergen Reactions  . Oxycodone Itching    VITALS:  Blood pressure (!) 167/79, pulse 90, temperature (!) 97.1 F (36.2 C), temperature source Tympanic, resp. rate (!) 23, SpO2 99 %.  PHYSICAL EXAMINATION:  Constitutional: Appears well-developed and well-nourished. No distress. HENT: Normocephalic. Marland Kitchen Oropharynx is clear and moist.  Eyes: Conjunctivae are normal no scleral icterus.  Neck: Normal ROM. Neck supple. No JVD. No tracheal deviation. CVS: RRR, S1/S2 +, no murmurs, no gallops, no carotid bruit.  Pulmonary: Effort and breath sounds normal, no stridor, rhonchi, wheezes, rales.  Abdominal: Soft. BS +,  no distension, tenderness, rebound or guarding.  Musculoskeletal: Normal range of motion. No edema and no tenderness.  Neuro:  No focal deficits. Skin: Skin is warm and dry. No rash noted. Psychiatric: sleeping from sedation    LABORATORY PANEL:   CBC Recent Labs  Lab 07/21/18 0423  WBC 7.5  HGB 7.6*  HCT 26.7*  PLT 324   ------------------------------------------------------------------------------------------------------------------  Chemistries  Recent Labs  Lab 07/20/18 1011 07/21/18 0423  NA 144 144  K 3.3* 3.2*  CL 109 109  CO2 26 27  GLUCOSE 110* 97  BUN 19 15  CREATININE 0.96 0.98  CALCIUM 8.8* 9.2  MG  --  2.0  AST 24  --   ALT 22  --   ALKPHOS 66  --   BILITOT 0.7  --    ------------------------------------------------------------------------------------------------------------------  Cardiac Enzymes Recent Labs  Lab 07/20/18 1011  TROPONINI <0.03    ------------------------------------------------------------------------------------------------------------------  RADIOLOGY:  Dg Chest 2 View  Result Date: 07/20/2018 CLINICAL DATA:  Acute shortness of breath for 1 week. EXAM: CHEST - 2 VIEW COMPARISON:  10/25/2016 FINDINGS: UPPER limits normal heart size noted. Pulmonary vascular congestion identified with a trace RIGHT pleural effusion. No definite airspace disease noted. No pulmonary mass, pneumothorax or acute bony abnormality. IMPRESSION: Pulmonary vascular congestion with trace RIGHT pleural effusion. Electronically Signed   By: Margarette Canada M.D.   On: 07/20/2018 10:14     ASSESSMENT AND PLAN:   77 year old female with history of PFO, GERD who presented to the hospital due to shortness of breath  1.  Acute microcytic blood loss anemia on chronic anemia: Patient has severe iron deficiency.  Patient is status post EGD showing gastric ulcers and erosions. GI has recommended PPI. She will need outpatient follow-up with Dr. Alice Reichert for colonoscopy. Case discussed with GI this morning I ordered IV iron this morning. Patient is status post 2 unit PRBC CBC for a.m. Transfuse if hemoglobin less than 7  2.  Shortness of breath in the setting of microcytic anemia with acute blood loss Follow-up on echocardiogram 3.  History of CVA with PFO: We will resume aspirin prior to discharge  4.  Essential hypertension: Continue lisinopril and verapamil  5 Continue eyedrops  6.  Depression: Continue Lexapro   7.  Hyperlipidemia: Continue pravastatin   8.  Hypokalemia: Replace and recheck in a.m. CODE STATUS: full  TOTAL TIME TAKING CARE OF THIS PATIENT: 27 minutes.     POSSIBLE D/C tomorrow, DEPENDING ON CLINICAL CONDITION.  Arvie Bartholomew M.D on 07/21/2018 at 12:34 PM  Between 7am to 6pm - Pager - [phone] After 6pm go to [email or web address] - password [email or web address] [place]  Office  [phone]  CC: Primary  care physician; Susy Frizzle, MD  Note: This dictation was prepared with Dragon dictation along with smaller phrase technology. Any transcriptional errors that result from this process are unintentional.

## 2018-07-21 NOTE — Anesthesia Post-op Follow-up Note (Signed)
Anesthesia QCDR form completed.        

## 2018-07-21 NOTE — Transfer of Care (Signed)
Immediate Anesthesia Transfer of Care Note  [name]  Procedure(s) Performed: ESOPHAGOGASTRODUODENOSCOPY (EGD) (N/A ) COLONOSCOPY (N/A )  Patient Location: PACU  Anesthesia Type:General  Level of Consciousness: sedated  Airway & Oxygen Therapy: Patient connected to nasal cannula oxygen  Post-op Assessment: Post -op Vital signs reviewed and stable  Post vital signs: stable  Last Vitals:  Vitals Value Taken Time  BP 131/54 07/21/2018 11:22 AM  Temp 36.2 C 07/21/2018 11:23 AM  Pulse 99 07/21/2018 11:30 AM  Resp 31 07/21/2018 11:30 AM  SpO2 97 % 07/21/2018 11:30 AM  Vitals shown include unvalidated device data.  Last Pain:  Vitals:   07/21/18 1123  TempSrc: Tympanic  PainSc:          Complications: No apparent anesthesia complications

## 2018-07-21 NOTE — Anesthesia Postprocedure Evaluation (Signed)
Anesthesia Post Note  [name]  Procedure(s) Performed: ESOPHAGOGASTRODUODENOSCOPY (EGD) (N/A ) COLONOSCOPY (N/A )  Patient location during evaluation: Endoscopy Anesthesia Type: General Level of consciousness: awake and alert Pain management: pain level controlled Vital Signs Assessment: post-procedure vital signs reviewed and stable Respiratory status: spontaneous breathing, nonlabored ventilation, respiratory function stable and patient connected to nasal cannula oxygen Cardiovascular status: blood pressure returned to baseline and stable Postop Assessment: no apparent nausea or vomiting Anesthetic complications: no     Last Vitals:  Vitals:   07/21/18 1200 07/21/18 1202  BP:  (!) 167/79  Pulse:  90  Resp:  (!) 23  Temp:    SpO2: 98% 99%    Last Pain:  Vitals:   07/21/18 1155  TempSrc:   PainSc: 0-No pain                 Precious Haws Piscitello

## 2018-07-21 NOTE — Progress Notes (Signed)
Patient not cleaned out, cancelled colonoscopy due to poor prep

## 2018-07-21 NOTE — Plan of Care (Signed)

## 2018-07-22 ENCOUNTER — Inpatient Hospital Stay (HOSPITAL_COMMUNITY)
Admit: 2018-07-22 | Discharge: 2018-07-22 | Disposition: A | Discharge status: Home / Self Care | Payer: Medicare Other | Attending: Specialist | Admitting: Specialist

## 2018-07-22 DIAGNOSIS — I340 Nonrheumatic mitral (valve) insufficiency: Secondary | ICD-10-CM

## 2018-07-22 LAB — BASIC METABOLIC PANEL
Anion gap: 9 (ref 5–15)
BUN: 14 mg/dL (ref 8–23)
CO2: 31 mmol/L (ref 22–32)
Calcium: 9.5 mg/dL (ref 8.9–10.3)
Chloride: 105 mmol/L (ref 98–111)
Creatinine, Ser: 1.1 mg/dL — ABNORMAL HIGH (ref 0.44–1.00)
GFR calc Af Amer: 56 mL/min — ABNORMAL LOW (ref >60)
GFR calc non Af Amer: 48 mL/min — ABNORMAL LOW (ref >60)
Glucose, Bld: 109 mg/dL — ABNORMAL HIGH (ref 70–99)
Potassium: 3.6 mmol/L (ref 3.5–5.1)
Sodium: 145 mmol/L (ref 135–145)

## 2018-07-22 LAB — CBC
HCT: 28.3 % — ABNORMAL LOW (ref 36.0–46.0)
Hemoglobin: 7.8 g/dL — ABNORMAL LOW (ref 12.0–15.0)
MCH: 18.5 pg — ABNORMAL LOW (ref 26.0–34.0)
MCHC: 27.6 g/dL — ABNORMAL LOW (ref 30.0–36.0)
MCV: 67.2 fL — ABNORMAL LOW (ref 80.0–100.0)
Platelets: 312 10*3/uL (ref 150–400)
RBC: 4.21 MIL/uL (ref 3.87–5.11)
RDW: 25.8 % — ABNORMAL HIGH (ref 11.5–15.5)
WBC: 10.1 10*3/uL (ref 4.0–10.5)
nRBC: 0.8 % — ABNORMAL HIGH (ref 0.0–0.2)

## 2018-07-22 LAB — ECHOCARDIOGRAM COMPLETE

## 2018-07-22 MED ORDER — SODIUM CHLORIDE 0.9 % IV SOLN
510.0000 mg | Freq: Once | INTRAVENOUS | Status: DC
  Filled 2018-07-22: qty 17

## 2018-07-22 MED ORDER — PANTOPRAZOLE SODIUM 40 MG PO TBEC: 40.0000 mg | DELAYED_RELEASE_TABLET | Freq: Two times a day (BID) | ORAL | 0 refills | Status: DC

## 2018-07-22 NOTE — Consult Note (Signed)
Pharmacy consulted to manage electrolytes.  K=3.6- no supp needed recheck in AM  Labs in AM   Rayna Sexton, PharmD, BCPS Clinical Pharmacist 07/22/2018 9:56 AM

## 2018-07-22 NOTE — Progress Notes (Signed)
*  PRELIMINARY RESULTS* Echocardiogram 2D Echocardiogram has been performed.  Sherrie Sport 07/22/2018, 8:41 AM

## 2018-07-22 NOTE — Evaluation (Signed)
Physical Therapy Evaluation Patient Details Name: Virginia Walters MRN: [id number] DOB: 01-10-1941 Today's Date: 07/22/2018   History of Present Illness  Pt is a 77 y/o female with a hx of alcoholism, R eye 100% vision impairment, and frequent falls, who presented to ED 12/6 following episode of SOB. Patient diagnosed with microcyctic anemia with possible GI bleed. Patient lives alone with cat, in 1 story home with 6 steps to enter with R handrail. Reports does not use O2 at home. Patient reports she utilizes 2W walker at home and a cane to get into her car, and an electric scooter when she goes to stores. Patient reports she was ind at home, but does report frequent falls.   Clinical Impression  Patient was up on bedside commode when PT arrived, pt assisted patient back into bed 3 steps with no O2 change. Patient is demonstrating safety awareness with basic transfers (supine>sit>stand) with modI and good safety awareness, with decreased 02, that increases rapidly with pursed lip breathing. Patient with good sitting balance, and poor standing balance, with shuffle gait pattern. Pt's O2 decreases with all activity, is able to rapidly rise in standing with pursed lip breathing, required 2L nasal canula for 31min to increase from 78% TO 99% following walking around room. Pt continues to note that she does not "feel short of breath" so PT continued to educate patient on importance of pursed lip breathing to maintain proper O2 levels, pt verbalized understanding. Would benefit from skilled PT to address above deficits and promote optimal return to PLOF     Follow Up Recommendations SNF    Equipment Recommendations  Standard walker    Recommendations for Other Services       Precautions / Restrictions Precautions Precautions: Fall Precaution Comments: Patient reports frequent falls at home Restrictions Weight Bearing Restrictions: No      Mobility  Bed Mobility Overal bed mobility: Modified  Independent             General bed mobility comments: increased time, bed rails  Transfers Overall transfer level: Modified independent Equipment used: Rolling walker (2 wheeled)             General transfer comment: Good safety with standing (one hand on RW, other on bed for push off), controls descent with RW. O2 drops to 88% with standing, returns to 93% with pursed lip breathing  Ambulation/Gait Ambulation/Gait assistance: Supervision Gait Distance (Feet): 20 Feet Assistive device: Rolling walker (2 wheeled) Gait Pattern/deviations: Shuffle Gait velocity: decreased   General Gait Details: Patients O2 sats dropping as low as 78% with rapid improvement to 94% with 2L nasal canula O2  Stairs            Wheelchair Mobility    Modified Rankin (Stroke Patients Only)       Balance Overall balance assessment: Needs assistance Sitting-balance support: No upper extremity supported Sitting balance-Leahy Scale: Good   Postural control: Posterior lean Standing balance support: No upper extremity supported;Bilateral upper extremity supported Standing balance-Leahy Scale: Poor Standing balance comment: Able to accept min challenge with UE supported; no challenge with UE unsupported Single Leg Stance - Right Leg: 0 Single Leg Stance - Left Leg: 0     Rhomberg - Eyes Opened: 10 Rhomberg - Eyes Closed: 5                 Pertinent Vitals/Pain Pain Assessment: No/denies pain    Home Living Family/patient expects to be discharged to:: Private residence Living Arrangements: Alone  Type of Home: House Home Access: Stairs to enter Entrance Stairs-Rails: Right Entrance Stairs-Number of Steps: 6 Home Layout: One level Home Equipment: Clinical cytogeneticist - 2 wheels;Cane - single point Additional Comments: Reports she utilizes RW at home, but is too bulky to take in community. In community takes cane to get in and out of car to electric scooter    Prior  Function Level of Independence: Independent with assistive device(s)         Comments: CGA with cuing for breath control with RW     Hand Dominance   Dominant Hand: Right    Extremity/Trunk Assessment   Upper Extremity Assessment Upper Extremity Assessment: Overall WFL for tasks assessed    Lower Extremity Assessment Lower Extremity Assessment: Overall WFL for tasks assessed    Cervical / Trunk Assessment Cervical / Trunk Assessment: Kyphotic  Communication   Communication: No difficulties  Cognition Arousal/Alertness: Awake/alert;Lethargic   Overall Cognitive Status: Within Functional Limits for tasks assessed                                        General Comments      Exercises Other Exercises Other Exercises: EOB balance with UE unsupported, patient able to utilize wt shift to remain sitting with mod challenge. Patient's O2 decreased to 89% with rapid increase to 99% with pursed lip breathing Other Exercises: STS: patient demonstrates good safety with standing, remained standing for 2 mins, O2 dropped to 81% with standing with rapid inc to 92% with pursed lip breathing. Completed static balance activities (feet apart without support, feet together without support, feet apart eyes closed, feet together eyes closed) Other Exercises: Ambulation 49ft in room with shuffle gait and CGA for safety. O2 dropped to 78%, up to 81% rapidly with pursed lip breathing, up to 99% with 2L O2 through nasal cannula rapidly Other Exercises: Education with demonstration on pursed lip breathing. Patient is able to complete with accuracy and shows good improvement in O2 sats with pursed lip and diaphragmatic breathing. Although pts O2 is decreasing with activity (with rapid recovery), patient denies feeling SOB. PT advised patient to complete breathing with all transfers/movements even if she "does not feel SOB"   Assessment/Plan    PT Assessment Patient needs continued PT  services  PT Problem List Decreased strength;Decreased mobility;Decreased safety awareness;Decreased activity tolerance;Decreased balance;Cardiopulmonary status limiting activity       PT Treatment Interventions DME instruction;Therapeutic activities;Gait training;Therapeutic exercise;Patient/family education;Stair training;Balance training;Neuromuscular re-education;Manual techniques;Functional mobility training    PT Goals (Current goals can be found in the Care Plan section)  Acute Rehab PT Goals Patient Stated Goal: Return home w/ decreased fall risk PT Goal Formulation: With patient Time For Goal Achievement: 08/05/18 Potential to Achieve Goals: Fair    Frequency Min 2X/week   Barriers to discharge Decreased caregiver support Patient perception of condition, lack of social support    Co-evaluation PT/OT/SLP Co-Evaluation/Treatment: Yes Reason for Co-Treatment: Complexity of the patient's impairments (multi-system involvement);To address functional/ADL transfers;For patient/therapist safety PT goals addressed during session: Mobility/safety with mobility;Balance;Strengthening/ROM;Proper use of DME         AM-PAC PT "6 Clicks" Mobility  Outcome Measure Help needed turning from your back to your side while in a flat bed without using bedrails?: A Little Help needed moving from lying on your back to sitting on the side of a flat bed without using bedrails?: A Little Help  needed moving to and from a bed to a chair (including a wheelchair)?: A Little Help needed standing up from a chair using your arms (e.g., wheelchair or bedside chair)?: A Little Help needed to walk in hospital room?: Total Help needed climbing 3-5 steps with a railing? : Total 6 Click Score: 14    End of Session Equipment Utilized During Treatment: Gait belt;Oxygen Activity Tolerance: Treatment limited secondary to medical complications (Comment);Patient tolerated treatment well Patient left: in chair;with  chair alarm set;with call bell/phone within reach Nurse Communication: Mobility status;Other (comment)(O2 sats) PT Visit Diagnosis: Unsteadiness on feet (R26.81);Other abnormalities of gait and mobility (R26.89);Repeated falls (R29.6);Muscle weakness (generalized) (M62.81);History of falling (Z91.81)    Time:  -      Charges:   PT Evaluation $PT Eval Moderate Complexity: 1 Mod PT Treatments $Therapeutic Activity: 8-22 mins        Shelton Silvas PT, DPT  Shelton Silvas 07/22/2018, 10:39 AM

## 2018-07-22 NOTE — Discharge Summary (Signed)
[facility] [place] NAME: Virginia Walters    MR#:  [id number]  DATE OF BIRTH:  06-Jul-1941  DATE OF ADMISSION:  07/20/2018 ADMITTING PHYSICIAN: Henreitta Leber, MD  DATE OF DISCHARGE: 07/22/2018  PRIMARY CARE PHYSICIAN: Susy Frizzle, MD    ADMISSION DIAGNOSIS:  Rectal bleeding [K62.5] Anemia, unspecified type [Q03.4] Systolic congestive heart failure, unspecified HF chronicity ([place]) [I50.20]  DISCHARGE DIAGNOSIS:  Active Problems:   Microcytic anemia   SECONDARY DIAGNOSIS:   Past Medical History:  Diagnosis Date  . Dyslipidemia   . Frequent falls   . GERD (gastroesophageal reflux disease)   . HTN (hypertension)   . Memory loss   . PFO (patent foramen ovale)   . Retinal artery occlusion   . Weakness     HOSPITAL COURSE:    77 year old female with history of PFO, GERD who presented to the hospital due to shortness of breath  1.  Acute microcytic blood loss anemia on chronic anemia: Patient has severe iron deficiency.  Patient is status post EGD showing gastric ulcers and erosions. GI has recommended PPI BID. She will need outpatient follow-up with Dr. Alice Reichert for colonoscopy and at some point repeat EGD.Marland Kitchen She received IV iron as well and is is status post 2 unit PRBC with a stable hemoglobin.   2.  Shortness of breath in the setting of microcytic anemia with acute blood loss Her Echo shows normal ejection fraction with no wall motion abnormalities or major valvular abnormalities.  She did have episode of shortness of breath and was found to be hypoxic however her O2 saturations went up to room air at 99%.  She does have pursed lip breathing which causes her oxygen level to decrease.   3.  History of CVA with PFO: Due to the gastric ulcers and recent GI bleed aspirin is on hold.  This will be restarted under her PCPs discretion.     4.  Essential hypertension: She will continue lisinopril and verapamil  5  glaucoma:  Continue eyedrops  6.  Depression: Continue Lexapro   7.  Hyperlipidemia: Continue pravastatin   8.  Hypokalemia: This was repleted.  CODE STATUS: full   DISCHARGE CONDITIONS AND DIET:   Stable heart healthy diet  CONSULTS OBTAINED:  Treatment Team:  Efrain Sella, MD Lin Landsman, MD  DRUG ALLERGIES:   Allergies  Allergen Reactions  . Oxycodone Itching    DISCHARGE MEDICATIONS:   Allergies as of 07/22/2018      Reactions   Oxycodone Itching      Medication List    STOP taking these medications   aspirin EC 81 MG tablet     TAKE these medications   acamprosate 333 MG tablet Commonly known as:  CAMPRAL TAKE 1 TABLET BY MOUTH 3  TIMES DAILY WITH MEALS What changed:  See the new instructions.   B-COMPLEX PO Take 1 capsule by mouth daily.   CALCIUM + D PO Take 1 capsule by mouth 2 (two) times daily.   dorzolamide 2 % ophthalmic solution Commonly known as:  TRUSOPT Place 1 drop into the left eye 2 (two) times daily.   escitalopram 10 MG tablet Commonly known as:  LEXAPRO TAKE 1 TABLET BY MOUTH  DAILY   fish oil-omega-3 fatty acids 1000 MG capsule Take 2 g by mouth 2 (two) times daily.   folic acid 1 MG tablet Commonly known as:  FOLVITE Take 1 mg by mouth daily.  latanoprost 0.005 % ophthalmic solution Commonly known as:  XALATAN Place 1 drop into the left eye at bedtime.   lisinopril 20 MG tablet Commonly known as:  PRINIVIL,ZESTRIL Take 1 tablet (20 mg total) by mouth daily.   MELATONIN PO Take 5 mg by mouth at bedtime.   mirabegron ER 25 MG Tb24 tablet Commonly known as:  MYRBETRIQ Take 1 tablet (25 mg total) by mouth daily.   mometasone 0.1 % ointment Commonly known as:  ELOCON Apply topically daily.   multivitamin tablet Take 1 tablet by mouth daily.   pantoprazole 40 MG tablet Commonly known as:  PROTONIX Take 1 tablet (40 mg total) by mouth 2 (two) times daily.   pravastatin 40 MG tablet Commonly known  as:  PRAVACHOL TAKE 1 TABLET BY MOUTH  DAILY   traZODone 50 MG tablet Commonly known as:  DESYREL TAKE 1 TABLET BY MOUTH AT  BEDTIME   TURMERIC PO Take 1 capsule by mouth daily.   verapamil 240 MG CR tablet Commonly known as:  CALAN-SR TAKE 1 TABLET BY MOUTH AT  BEDTIME   vitamin B-12 1000 MCG tablet Commonly known as:  CYANOCOBALAMIN Take 1,000 mcg by mouth daily.   Vitamin D 50 MCG (2000 UT) Caps Take 1 capsule by mouth daily.   VITAMIN E PO Take 180 mg by mouth daily.         Today   CHIEF COMPLAINT:  Doing well this am  Had low O2 last night given Lasix O2 went up but now doing fine ECHO was normal   VITAL SIGNS:  Blood pressure (!) 147/81, pulse 76, temperature 97.6 F (36.4 C), temperature source Oral, resp. rate 20, SpO2 90 %.   REVIEW OF SYSTEMS:  Review of Systems  Constitutional: Negative.  Negative for chills, fever and malaise/fatigue.  HENT: Negative.  Negative for ear discharge, ear pain, hearing loss, nosebleeds and sore throat.   Eyes: Negative.  Negative for blurred vision and pain.  Respiratory: Negative.  Negative for cough, hemoptysis, shortness of breath and wheezing.   Cardiovascular: Negative.  Negative for chest pain, palpitations and leg swelling.  Gastrointestinal: Negative.  Negative for abdominal pain, blood in stool, diarrhea, nausea and vomiting.  Genitourinary: Negative.  Negative for dysuria.  Musculoskeletal: Negative.  Negative for back pain.  Skin: Negative.   Neurological: Negative for dizziness, tremors, speech change, focal weakness, seizures and headaches.  Endo/Heme/Allergies: Negative.  Does not bruise/bleed easily.  Psychiatric/Behavioral: Negative.  Negative for depression, hallucinations and suicidal ideas.     PHYSICAL EXAMINATION:  GENERAL:  77 y.o.-year-old patient lying in the bed with no acute distress.  NECK:  Supple, no jugular venous distention. No thyroid enlargement, no tenderness.  LUNGS: Normal  breath sounds bilaterally, no wheezing, rales,rhonchi  No use of accessory muscles of respiration.  CARDIOVASCULAR: S1, S2 normal. No murmurs, rubs, or gallops.  ABDOMEN: Soft, non-tender, non-distended. Bowel sounds present. No organomegaly or mass.  EXTREMITIES: No pedal edema, cyanosis, or clubbing.  PSYCHIATRIC: The patient is alert and oriented x 3.  SKIN: No obvious rash, lesion, or ulcer.   DATA REVIEW:   CBC Recent Labs  Lab 07/22/18 0453  WBC 10.1  HGB 7.8*  HCT 28.3*  PLT 312    Chemistries  Recent Labs  Lab 07/20/18 1011 07/21/18 0423 07/22/18 0453  NA 144 144 145  K 3.3* 3.2* 3.6  CL 109 109 105  CO2 26 27 31   GLUCOSE 110* 97 109*  BUN 19 15 14  CREATININE 0.96 0.98 1.10*  CALCIUM 8.8* 9.2 9.5  MG  --  2.0  --   AST 24  --   --   ALT 22  --   --   ALKPHOS 66  --   --   BILITOT 0.7  --   --     Cardiac Enzymes Recent Labs  Lab 07/20/18 1011  TROPONINI <0.03    Microbiology Results  @[email or web address] @  RADIOLOGY:  Dg Chest Port 1 View  Result Date: 07/21/2018 CLINICAL DATA:  Short of breath, former smoker.  Endoscopy same day EXAM: PORTABLE CHEST 1 VIEW COMPARISON:  07/20/2018 FINDINGS: Stable enlarged cardiac silhouette. No effusion, infiltrate pneumothorax. Chronic interstitial lung markings. No osseous abnormality. IMPRESSION: No acute cardiopulmonary process. Electronically Signed   By: Suzy Bouchard M.D.   On: 07/21/2018 21:58      Allergies as of 07/22/2018      Reactions   Oxycodone Itching      Medication List    STOP taking these medications   aspirin EC 81 MG tablet     TAKE these medications   acamprosate 333 MG tablet Commonly known as:  CAMPRAL TAKE 1 TABLET BY MOUTH 3  TIMES DAILY WITH MEALS What changed:  See the new instructions.   B-COMPLEX PO Take 1 capsule by mouth daily.   CALCIUM + D PO Take 1 capsule by mouth 2 (two) times daily.   dorzolamide 2 % ophthalmic solution Commonly known as:  TRUSOPT Place 1  drop into the left eye 2 (two) times daily.   escitalopram 10 MG tablet Commonly known as:  LEXAPRO TAKE 1 TABLET BY MOUTH  DAILY   fish oil-omega-3 fatty acids 1000 MG capsule Take 2 g by mouth 2 (two) times daily.   folic acid 1 MG tablet Commonly known as:  FOLVITE Take 1 mg by mouth daily.   latanoprost 0.005 % ophthalmic solution Commonly known as:  XALATAN Place 1 drop into the left eye at bedtime.   lisinopril 20 MG tablet Commonly known as:  PRINIVIL,ZESTRIL Take 1 tablet (20 mg total) by mouth daily.   MELATONIN PO Take 5 mg by mouth at bedtime.   mirabegron ER 25 MG Tb24 tablet Commonly known as:  MYRBETRIQ Take 1 tablet (25 mg total) by mouth daily.   mometasone 0.1 % ointment Commonly known as:  ELOCON Apply topically daily.   multivitamin tablet Take 1 tablet by mouth daily.   pantoprazole 40 MG tablet Commonly known as:  PROTONIX Take 1 tablet (40 mg total) by mouth 2 (two) times daily.   pravastatin 40 MG tablet Commonly known as:  PRAVACHOL TAKE 1 TABLET BY MOUTH  DAILY   traZODone 50 MG tablet Commonly known as:  DESYREL TAKE 1 TABLET BY MOUTH AT  BEDTIME   TURMERIC PO Take 1 capsule by mouth daily.   verapamil 240 MG CR tablet Commonly known as:  CALAN-SR TAKE 1 TABLET BY MOUTH AT  BEDTIME   vitamin B-12 1000 MCG tablet Commonly known as:  CYANOCOBALAMIN Take 1,000 mcg by mouth daily.   Vitamin D 50 MCG (2000 UT) Caps Take 1 capsule by mouth daily.   VITAMIN E PO Take 180 mg by mouth daily.           Management plans discussed with the patient and she is in agreement. Stable for discharge   Patient should follow up with GI  CODE STATUS:     Code Status Orders  (From admission, onward)  Start     Ordered   07/20/18 1546  Full code  Continuous     07/20/18 1545        Code Status History    This patient has a current code status but no historical code status.    Advance Directive Documentation     Most  Recent Value  Type of Advance Directive  Healthcare Power of Attorney, Living will  Pre-existing out of facility DNR order (yellow form or pink MOST form)  -  "MOST" Form in Place?  -      TOTAL TIME TAKING CARE OF THIS PATIENT: 38 minutes.    Note: This dictation was prepared with Dragon dictation along with smaller phrase technology. Any transcriptional errors that result from this process are unintentional.  Skylynn Burkley M.D on 07/22/2018 at 10:52 AM  Between 7am to 6pm - Pager - [phone] After 6pm go to [email or web address] - password [email or web address] [facility]  Office  [phone]  CC: Primary care physician; Susy Frizzle, MD

## 2018-07-22 NOTE — Progress Notes (Signed)
[facility] NAME: Virginia Walters    MR#:  [id number]  DATE OF BIRTH:  08-Aug-1941  SUBJECTIVE:   Doing well this am Had low O2 last night  REVIEW OF SYSTEMS:    Review of Systems  Constitutional: Negative.  Negative for chills, fever and malaise/fatigue.  HENT: Negative.  Negative for ear discharge, ear pain, hearing loss, nosebleeds and sore throat.   Eyes: Negative.  Negative for blurred vision and pain.  Respiratory: Negative.  Negative for cough, hemoptysis, shortness of breath and wheezing.   Cardiovascular: Negative.  Negative for chest pain, palpitations and leg swelling.  Gastrointestinal: Negative.  Negative for abdominal pain, blood in stool, diarrhea, nausea and vomiting.  Genitourinary: Negative.  Negative for dysuria.  Musculoskeletal: Negative.  Negative for back pain.  Skin: Negative.   Neurological: Negative for dizziness, tremors, speech change, focal weakness, seizures and headaches.  Endo/Heme/Allergies: Negative.  Does not bruise/bleed easily.  Psychiatric/Behavioral: Negative.  Negative for depression, hallucinations and suicidal ideas.      DRUG ALLERGIES:   Allergies  Allergen Reactions  . Oxycodone Itching    VITALS:  Blood pressure (!) 147/81, pulse 76, temperature 97.6 F (36.4 C), temperature source Oral, resp. rate 20, SpO2 90 %.  PHYSICAL EXAMINATION:  Constitutional: Appears well-developed and well-nourished. No distress. HENT: Normocephalic. Marland Kitchen Oropharynx is clear and moist.  Eyes: Conjunctivae are normal no scleral icterus.  Neck: Normal ROM. Neck supple. No JVD. No tracheal deviation. CVS: RRR, S1/S2 +, no murmurs, no gallops, no carotid bruit.  Pulmonary: Effort and breath sounds normal, no stridor, rhonchi, wheezes, rales.  Abdominal: Soft. BS +,  no distension, tenderness, rebound or guarding.  Musculoskeletal: Normal range of motion. No edema and no tenderness.  Neuro:  No focal  deficits. Skin: Skin is warm and dry. No rash noted. Psychiatric: good affect    LABORATORY PANEL:   CBC Recent Labs  Lab 07/22/18 0453  WBC 10.1  HGB 7.8*  HCT 28.3*  PLT 312   ------------------------------------------------------------------------------------------------------------------  Chemistries  Recent Labs  Lab 07/20/18 1011 07/21/18 0423 07/22/18 0453  NA 144 144 145  K 3.3* 3.2* 3.6  CL 109 109 105  CO2 26 27 31   GLUCOSE 110* 97 109*  BUN 19 15 14   CREATININE 0.96 0.98 1.10*  CALCIUM 8.8* 9.2 9.5  MG  --  2.0  --   AST 24  --   --   ALT 22  --   --   ALKPHOS 66  --   --   BILITOT 0.7  --   --    ------------------------------------------------------------------------------------------------------------------  Cardiac Enzymes Recent Labs  Lab 07/20/18 1011  TROPONINI <0.03   ------------------------------------------------------------------------------------------------------------------  RADIOLOGY:  Dg Chest Port 1 View  Result Date: 07/21/2018 CLINICAL DATA:  Short of breath, former smoker.  Endoscopy same day EXAM: PORTABLE CHEST 1 VIEW COMPARISON:  07/20/2018 FINDINGS: Stable enlarged cardiac silhouette. No effusion, infiltrate pneumothorax. Chronic interstitial lung markings. No osseous abnormality. IMPRESSION: No acute cardiopulmonary process. Electronically Signed   By: Suzy Bouchard M.D.   On: 07/21/2018 21:58     ASSESSMENT AND PLAN:   76 year old female with history of PFO, GERD who presented to the hospital due to shortness of breath  1.  Acute microcytic blood loss anemia on chronic anemia: Patient has severe iron deficiency.  Patient is status post EGD showing gastric ulcers and erosions. GI has recommended PPI BID. She will need outpatient follow-up with Dr.  Toledo for colonoscopy and at some point repeat EGD.Marland Kitchen She received IV iron as well and is is status post 2 unit PRBC with a stable hemoglobin.   2.  Shortness of  breath in the setting of microcytic anemia with acute blood loss Her Echo shows normal ejection fraction with no wall motion abnormalities or major valvular abnormalities.  She did have episode of shortness of breath and was found to be hypoxic however her O2 saturations went up to room air at 99%.  She does have pursed lip breathing which causes her oxygen level to decrease.   3.  History of CVA with PFO: Due to the gastric ulcers and recent GI bleed aspirin is on hold.  This will be restarted under her PCPs discretion.     4.  Essential hypertension: She will continue lisinopril and verapamil  5  glaucoma: Continue eyedrops  6.  Depression: Continue Lexapro   7.  Hyperlipidemia: Continue pravastatin   8.  Hypokalemia: This was repleted   .CODE STATUS: full  PT RECS SNF CSW aware   TOTAL TIME TAKING CARE OF THIS PATIENT: 24 minutes.     POSSIBLE D/C today DEPENDING ON CLINICAL CONDITION.   Vinetta Brach M.D on 07/22/2018 at 11:01 AM  Between 7am to 6pm - Pager - [phone] After 6pm go to [email or web address] - password [email or web address] [facility]  Office  [phone]  CC: Primary care physician; Susy Frizzle, MD  Note: This dictation was prepared with Dragon dictation along with smaller phrase technology. Any transcriptional errors that result from this process are unintentional.

## 2018-07-22 NOTE — Progress Notes (Signed)
Pt went into respiratory distress during shift and oxygen dropped to 85% and was placed on 2L [place]. Pt oxygen came to 90% and oxygen was turned to 4L. Pt was 94% on 4L. MD paged and lasix was ordered and administered. Pt oxygen was 98% on room air later in shift. Pt lungs sounds also improved during shift. No other signs of distress noted. Will continue to monitor.

## 2018-07-22 NOTE — Clinical Social Work Note (Signed)
Clinical Social Work Assessment  Patient Details  Name: Virginia Walters MRN: [id number] Date of Birth: [birth year]  Date of referral:  07/22/18               Reason for consult:  Facility Placement                Permission sought to share information with:  Chartered certified accountant granted to share information::  No  Name::        Agency::     Relationship::     Contact Information:     Housing/Transportation Living arrangements for the past 2 months:  Single Family Home Source of Information:  Patient, Medical Team Patient Interpreter Needed:  None Criminal Activity/Legal Involvement Pertinent to Current Situation/Hospitalization:  No - Comment as needed Significant Relationships:  Other Family Members Lives with:  Self Do you feel safe going back to the place where you live?  Yes Need for family participation in patient care:  No (Coment)  Care giving concerns:  PT recommendation for SNF   Social Worker assessment / plan: The CSW met with the patient at bedside to discuss discharge planning. The patient declined SNF referral and shared that her cousin will be staying with her to assist in her care. The patient pleasantly refused to discuss alcohol use hx.   The RNCM has also spoken with the patient, and she has declined HH. The CSW is signing off. Please consult should needs arise.  Employment status:  Retired Nurse, adult PT Recommendations:  [place] / Referral to community resources:     Patient/Family's Response to care:  The patient thanked the CSW.  Patient/Family's Understanding of and Emotional Response to Diagnosis, Current Treatment, and Prognosis:  The patient has poor insight into her post acute care needs; however, she is capable of making this decision.  Emotional Assessment Appearance:  Appears stated age Attitude/Demeanor/Rapport:  Engaged Affect (typically observed):   Pleasant Orientation:  Oriented to Self, Oriented to Place, Oriented to  Time, Oriented to Situation Alcohol / Substance use:  Alcohol Use Psych involvement (Current and /or in the community):  No (Comment)  Discharge Needs  Concerns to be addressed:  Patient refuses services, Care Coordination, Discharge Planning Concerns Readmission within the last 30 days:  No Current discharge risk:  Lives alone, Physical Impairment, Chronically ill Barriers to Discharge:  No Barriers Identified   Zettie Pho, LCSW 07/22/2018, 11:44 AM

## 2018-07-22 NOTE — Care Management Note (Signed)
Case Management Note  Patient Details  Name: DIMOND CROTTY MRN: [id number] Date of Birth: [birth year]  Subjective/Objective:  Patient to be discharged per MD order. Orders in place for home health services. Patient was actually recommended SNF but refused. I spoke with the patient at length about home health. She has had home health before and does not want to have it again. Patient has several cousins and neighbors who will stay with her essentially around the clock. She has a walker, wheelchair and cane. Requests no further DME. RNCM has nothing else to offer as we will respect patients wishes.                    Action/Plan:   Expected Discharge Date:  07/22/18               Expected Discharge Plan:     In-House Referral:     Discharge planning Services  CM Consult  Post Acute Care Choice:  Home Health Choice offered to:     DME Arranged:    DME Agency:     HH Arranged:  Patient Refused [place] Agency:     Status of Service:  Completed, signed off  If discussed at [employer] of Stay Meetings, dates discussed:    Additional Comments:  Latanya Maudlin, RN 07/22/2018, 11:31 AM

## 2018-07-22 NOTE — Progress Notes (Signed)
Patient discharged home with family, patient verbalized understanding of education.

## 2018-07-22 NOTE — Progress Notes (Signed)
Virginia Darby, MD [address]  [place]  [place], [place] [address]  Main: [phone]  Fax: [phone] Pager: [phone]   Subjective: Patient denies any complaints today, getting ready to go home.   Objective: Vital signs in last 24 hours: Vitals:   07/21/18 2046 07/22/18 0330 07/22/18 0753 07/22/18 1200  BP:  (!) 147/81  (!) 130/51  Pulse: 97 76  71  Resp:  20  20  Temp:  97.6 F (36.4 C)  98.1 F (36.7 C)  TempSrc:  Oral  Oral  SpO2: 96% 99% 90% 93%   Weight change:   Intake/Output Summary (Last 24 hours) at 07/22/2018 1305 Last data filed at 07/22/2018 1020 Gross per 24 hour  Intake 240 ml  Output 900 ml  Net -660 ml     Exam: Heart:: Regular rate and rhythm or S1S2 present Lungs: normal and clear to auscultation Abdomen: soft, nontender, normal bowel sounds   Lab Results: CBC Latest Ref Rng & Units 07/22/2018 07/21/2018 07/20/2018  WBC 4.0 - 10.5 K/uL 10.1 7.5 5.7  Hemoglobin 12.0 - 15.0 g/dL 7.8(L) 7.6(L) 5.3(L)  Hematocrit 36.0 - 46.0 % 28.3(L) 26.7(L) 21.7(L)  Platelets 150 - 400 K/uL 312 324 347   CMP Latest Ref Rng & Units 07/22/2018 07/21/2018 07/20/2018  Glucose 70 - 99 mg/dL 109(H) 97 110(H)  BUN 8 - 23 mg/dL 14 15 19   Creatinine 0.44 - 1.00 mg/dL 1.10(H) 0.98 0.96  Sodium 135 - 145 mmol/L 145 144 144  Potassium 3.5 - 5.1 mmol/L 3.6 3.2(L) 3.3(L)  Chloride 98 - 111 mmol/L 105 109 109  CO2 22 - 32 mmol/L 31 27 26   Calcium 8.9 - 10.3 mg/dL 9.5 9.2 8.8(L)  Total Protein 6.5 - 8.1 g/dL - - 7.3  Total Bilirubin 0.3 - 1.2 mg/dL - - 0.7  Alkaline Phos 38 - 126 U/L - - 66  AST 15 - 41 U/L - - 24  ALT 0 - 44 U/L - - 22   Micro Results: No results found for this or any previous visit (from the past 240 hour(s)). Studies/Results: Dg Chest Port 1 View  Result Date: 07/21/2018 CLINICAL DATA:  Short of breath, former smoker.  Endoscopy same day EXAM: PORTABLE CHEST 1 VIEW COMPARISON:  07/20/2018 FINDINGS: Stable enlarged cardiac  silhouette. No effusion, infiltrate pneumothorax. Chronic interstitial lung markings. No osseous abnormality. IMPRESSION: No acute cardiopulmonary process. Electronically Signed   By: Suzy Bouchard M.D.   On: 07/21/2018 21:58   Medications:  I have reviewed the patient's current medications. Prior to Admission:  No medications prior to admission.   Scheduled: . calcium citrate-vitamin D  1 tablet Oral BID  . cholecalciferol  2,000 Units Oral Daily  . dorzolamide  1 drop Left Eye BID  . escitalopram  10 mg Oral Daily  . ipratropium-albuterol  3 mL Nebulization Q6H  . latanoprost  1 drop Left Eye QHS  . lisinopril  20 mg Oral Daily  . Melatonin  5 mg Oral QHS  . multivitamin with minerals  1 tablet Oral Daily  . omega-3 acid ethyl esters  2 g Oral BID  . pantoprazole (PROTONIX) IV  40 mg Intravenous Q12H  . pravastatin  40 mg Oral Daily  . traZODone  50 mg Oral QHS  . verapamil  240 mg Oral QHS  . vitamin B-12  1,000 mcg Oral Daily   Continuous: . ferumoxytol     TUU:[id number] **OR** acetaminophen, ondansetron **OR** ondansetron (ZOFRAN) IV Scheduled Meds: .  calcium citrate-vitamin D  1 tablet Oral BID  . cholecalciferol  2,000 Units Oral Daily  . dorzolamide  1 drop Left Eye BID  . escitalopram  10 mg Oral Daily  . ipratropium-albuterol  3 mL Nebulization Q6H  . latanoprost  1 drop Left Eye QHS  . lisinopril  20 mg Oral Daily  . Melatonin  5 mg Oral QHS  . multivitamin with minerals  1 tablet Oral Daily  . omega-3 acid ethyl esters  2 g Oral BID  . pantoprazole (PROTONIX) IV  40 mg Intravenous Q12H  . pravastatin  40 mg Oral Daily  . traZODone  50 mg Oral QHS  . verapamil  240 mg Oral QHS  . vitamin B-12  1,000 mcg Oral Daily   Continuous Infusions: . 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  No evidence of active bleeding Colonoscopy was not performed due to poor prep  Plan: Told patient that she should stay on PPI twice daily indefinitely due to presence of hernia Advised patient to start taking oral iron 1-2 times daily She should follow-up with [facility] in 4 weeks after discharge Referral to hematology for parenteral iron therapy due to severe iron deficiency anemia Recheck CBC at PCPs office in 2 weeks Okay to discharge home from GI standpoint  Thank you for involving me in taking care of this patient.   LOS: 2 days   Virginia Walters 07/22/2018, 1:05 PM

## 2018-07-22 NOTE — Progress Notes (Signed)
Ambulated patient in hallway, 02 sats 94% on RA with exertion. Notified Dr. Benjie Karvonen.

## 2018-07-23 ENCOUNTER — Encounter: Payer: Self-pay | Admitting: Gastroenterology

## 2018-07-24 ENCOUNTER — Other Ambulatory Visit: Payer: Self-pay | Admitting: Family Medicine

## 2018-07-24 MED ORDER — PANTOPRAZOLE SODIUM 40 MG PO TBEC: 40.0000 mg | DELAYED_RELEASE_TABLET | Freq: Two times a day (BID) | ORAL | 3 refills | Status: DC

## 2018-07-25 LAB — SURGICAL PATHOLOGY

## 2018-07-26 ENCOUNTER — Other Ambulatory Visit: Payer: Self-pay

## 2018-07-26 DIAGNOSIS — K279 Peptic ulcer, site unspecified, unspecified as acute or chronic, without hemorrhage or perforation: Secondary | ICD-10-CM

## 2018-07-26 DIAGNOSIS — D509 Iron deficiency anemia, unspecified: Secondary | ICD-10-CM

## 2018-07-30 ENCOUNTER — Encounter: Payer: Self-pay | Admitting: Emergency Medicine

## 2018-07-30 ENCOUNTER — Other Ambulatory Visit: Payer: Self-pay

## 2018-07-30 ENCOUNTER — Encounter: Payer: Self-pay | Admitting: Family Medicine

## 2018-07-30 ENCOUNTER — Ambulatory Visit (INDEPENDENT_AMBULATORY_CARE_PROVIDER_SITE_OTHER): Payer: Medicare Other | Admitting: Family Medicine

## 2018-07-30 ENCOUNTER — Inpatient Hospital Stay
Admission: EM | Admit: 2018-07-30 | Discharge: 2018-08-03 | DRG: 189 | Disposition: A | Discharge status: Home Health | Payer: Medicare Other | Attending: Internal Medicine | Admitting: Internal Medicine

## 2018-07-30 ENCOUNTER — Emergency Department: Payer: Medicare Other

## 2018-07-30 VITALS — BP 128/76 | HR 80 | Temp 98.1°F | Resp 20 | Ht 65.0 in | Wt 225.0 lb

## 2018-07-30 DIAGNOSIS — I509 Heart failure, unspecified: Secondary | ICD-10-CM

## 2018-07-30 DIAGNOSIS — Z79899 Other long term (current) drug therapy: Secondary | ICD-10-CM | POA: Diagnosis not present

## 2018-07-30 DIAGNOSIS — J9601 Acute respiratory failure with hypoxia: Secondary | ICD-10-CM

## 2018-07-30 DIAGNOSIS — I10 Essential (primary) hypertension: Secondary | ICD-10-CM

## 2018-07-30 DIAGNOSIS — I5033 Acute on chronic diastolic (congestive) heart failure: Secondary | ICD-10-CM

## 2018-07-30 DIAGNOSIS — N189 Chronic kidney disease, unspecified: Secondary | ICD-10-CM

## 2018-07-30 DIAGNOSIS — Q211 Atrial septal defect: Secondary | ICD-10-CM | POA: Diagnosis not present

## 2018-07-30 DIAGNOSIS — Z8673 Personal history of transient ischemic attack (TIA), and cerebral infarction without residual deficits: Secondary | ICD-10-CM | POA: Diagnosis not present

## 2018-07-30 DIAGNOSIS — I5043 Acute on chronic combined systolic (congestive) and diastolic (congestive) heart failure: Secondary | ICD-10-CM | POA: Diagnosis present

## 2018-07-30 DIAGNOSIS — R32 Unspecified urinary incontinence: Secondary | ICD-10-CM | POA: Diagnosis present

## 2018-07-30 DIAGNOSIS — Z6835 Body mass index (BMI) 35.0-35.9, adult: Secondary | ICD-10-CM

## 2018-07-30 DIAGNOSIS — I130 Hypertensive heart and chronic kidney disease with heart failure and stage 1 through stage 4 chronic kidney disease, or unspecified chronic kidney disease: Secondary | ICD-10-CM | POA: Diagnosis present

## 2018-07-30 DIAGNOSIS — J9690 Respiratory failure, unspecified, unspecified whether with hypoxia or hypercapnia: Secondary | ICD-10-CM | POA: Diagnosis not present

## 2018-07-30 DIAGNOSIS — J441 Chronic obstructive pulmonary disease with (acute) exacerbation: Secondary | ICD-10-CM | POA: Diagnosis not present

## 2018-07-30 DIAGNOSIS — N183 Chronic kidney disease, stage 3 (moderate): Secondary | ICD-10-CM | POA: Diagnosis present

## 2018-07-30 DIAGNOSIS — N179 Acute kidney failure, unspecified: Secondary | ICD-10-CM | POA: Diagnosis not present

## 2018-07-30 DIAGNOSIS — J9600 Acute respiratory failure, unspecified whether with hypoxia or hypercapnia: Secondary | ICD-10-CM

## 2018-07-30 DIAGNOSIS — E785 Hyperlipidemia, unspecified: Secondary | ICD-10-CM | POA: Diagnosis not present

## 2018-07-30 DIAGNOSIS — R296 Repeated falls: Secondary | ICD-10-CM | POA: Diagnosis not present

## 2018-07-30 DIAGNOSIS — Z87891 Personal history of nicotine dependence: Secondary | ICD-10-CM | POA: Diagnosis not present

## 2018-07-30 DIAGNOSIS — J9622 Acute and chronic respiratory failure with hypercapnia: Principal | ICD-10-CM | POA: Diagnosis present

## 2018-07-30 DIAGNOSIS — J9621 Acute and chronic respiratory failure with hypoxia: Secondary | ICD-10-CM | POA: Diagnosis present

## 2018-07-30 DIAGNOSIS — E876 Hypokalemia: Secondary | ICD-10-CM | POA: Diagnosis present

## 2018-07-30 DIAGNOSIS — D638 Anemia in other chronic diseases classified elsewhere: Secondary | ICD-10-CM | POA: Diagnosis present

## 2018-07-30 DIAGNOSIS — Z885 Allergy status to narcotic agent status: Secondary | ICD-10-CM | POA: Diagnosis not present

## 2018-07-30 DIAGNOSIS — I5023 Acute on chronic systolic (congestive) heart failure: Secondary | ICD-10-CM | POA: Diagnosis not present

## 2018-07-30 DIAGNOSIS — K219 Gastro-esophageal reflux disease without esophagitis: Secondary | ICD-10-CM | POA: Diagnosis present

## 2018-07-30 DIAGNOSIS — E669 Obesity, unspecified: Secondary | ICD-10-CM | POA: Diagnosis present

## 2018-07-30 DIAGNOSIS — R410 Disorientation, unspecified: Secondary | ICD-10-CM

## 2018-07-30 DIAGNOSIS — R0602 Shortness of breath: Secondary | ICD-10-CM | POA: Diagnosis not present

## 2018-07-30 LAB — CBC WITH DIFFERENTIAL/PLATELET
Abs Immature Granulocytes: 0.05 10*3/uL (ref 0.00–0.07)
Basophils Absolute: 0 10*3/uL (ref 0.0–0.1)
Basophils Relative: 0 %
Eosinophils Absolute: 0.1 10*3/uL (ref 0.0–0.5)
Eosinophils Relative: 2 %
HCT: 33.4 % — ABNORMAL LOW (ref 36.0–46.0)
Hemoglobin: 9.3 g/dL — ABNORMAL LOW (ref 12.0–15.0)
Immature Granulocytes: 1 %
Lymphocytes Relative: 13 %
Lymphs Abs: 1.3 10*3/uL (ref 0.7–4.0)
MCH: 20.6 pg — ABNORMAL LOW (ref 26.0–34.0)
MCHC: 27.8 g/dL — ABNORMAL LOW (ref 30.0–36.0)
MCV: 74.1 fL — ABNORMAL LOW (ref 80.0–100.0)
Monocytes Absolute: 1.3 10*3/uL — ABNORMAL HIGH (ref 0.1–1.0)
Monocytes Relative: 13 %
Neutro Abs: 6.9 10*3/uL (ref 1.7–7.7)
Neutrophils Relative %: 71 %
Platelets: 238 10*3/uL (ref 150–400)
RBC: 4.51 MIL/uL (ref 3.87–5.11)
RDW: 32.5 % — ABNORMAL HIGH (ref 11.5–15.5)
WBC: 9.7 10*3/uL (ref 4.0–10.5)
nRBC: 0 % (ref 0.0–0.2)

## 2018-07-30 LAB — TROPONIN I
Troponin I: <0.03 ng/mL (ref <0.03)
Troponin I: <0.03 ng/mL (ref <0.03)
Troponin I: <0.03 ng/mL (ref <0.03)

## 2018-07-30 LAB — BASIC METABOLIC PANEL
Anion gap: 8 (ref 5–15)
BUN: 27 mg/dL — ABNORMAL HIGH (ref 8–23)
CO2: 26 mmol/L (ref 22–32)
Calcium: 9.1 mg/dL (ref 8.9–10.3)
Chloride: 106 mmol/L (ref 98–111)
Creatinine, Ser: 1.32 mg/dL — ABNORMAL HIGH (ref 0.44–1.00)
GFR calc Af Amer: 45 mL/min — ABNORMAL LOW (ref >60)
GFR calc non Af Amer: 39 mL/min — ABNORMAL LOW (ref >60)
Glucose, Bld: 152 mg/dL — ABNORMAL HIGH (ref 70–99)
Potassium: 3.4 mmol/L — ABNORMAL LOW (ref 3.5–5.1)
Sodium: 140 mmol/L (ref 135–145)

## 2018-07-30 LAB — BLOOD GAS, VENOUS
Acid-Base Excess: 1.5 mmol/L (ref 0.0–2.0)
Bicarbonate: 27.2 mmol/L (ref 20.0–28.0)
O2 Saturation: 88.6 %
Patient temperature: 37
pCO2, Ven: 46 mmHg (ref 44.0–60.0)
pH, Ven: 7.38 (ref 7.250–7.430)
pO2, Ven: 57 mmHg — ABNORMAL HIGH (ref 32.0–45.0)

## 2018-07-30 LAB — ABO/RH: ABO/RH(D): A POS

## 2018-07-30 LAB — CG4 I-STAT (LACTIC ACID): Lactic Acid, Venous: 0.71 mmol/L (ref 0.5–1.9)

## 2018-07-30 LAB — BRAIN NATRIURETIC PEPTIDE: B Natriuretic Peptide: 152 pg/mL — ABNORMAL HIGH (ref 0.0–100.0)

## 2018-07-30 LAB — GLUCOSE, CAPILLARY: Glucose-Capillary: 131 mg/dL — ABNORMAL HIGH (ref 70–99)

## 2018-07-30 LAB — MRSA PCR SCREENING: MRSA by PCR: NEGATIVE

## 2018-07-30 MED ORDER — METHYLPREDNISOLONE SODIUM SUCC 125 MG IJ SOLR
125.0000 mg | Freq: Once | INTRAMUSCULAR | Status: AC
  Administered 2018-07-30: 125 mg via INTRAVENOUS
  Filled 2018-07-30: qty 2

## 2018-07-30 MED ORDER — IPRATROPIUM-ALBUTEROL 0.5-2.5 (3) MG/3ML IN SOLN
3.0000 mL | Freq: Once | RESPIRATORY_TRACT | Status: AC
  Administered 2018-07-30: 3 mL via RESPIRATORY_TRACT

## 2018-07-30 MED ORDER — POTASSIUM CHLORIDE CRYS ER 20 MEQ PO TBCR
20.0000 meq | EXTENDED_RELEASE_TABLET | Freq: Every day | ORAL | Status: DC
  Administered 2018-07-30 – 2018-08-03 (×5): 20 meq via ORAL
  Filled 2018-07-30 (×5): qty 1

## 2018-07-30 MED ORDER — VERAPAMIL HCL ER 240 MG PO TBCR: 240.0000 mg | EXTENDED_RELEASE_TABLET | Freq: Every day | ORAL | Status: DC

## 2018-07-30 MED ORDER — PANTOPRAZOLE SODIUM 40 MG PO TBEC
40.0000 mg | DELAYED_RELEASE_TABLET | Freq: Every day | ORAL | Status: DC
  Administered 2018-07-31 – 2018-08-03 (×4): 40 mg via ORAL
  Filled 2018-07-30 (×4): qty 1

## 2018-07-30 MED ORDER — FOLIC ACID 1 MG PO TABS
1.0000 mg | ORAL_TABLET | Freq: Every day | ORAL | Status: DC
  Administered 2018-07-31 – 2018-08-03 (×4): 1 mg via ORAL
  Filled 2018-07-30 (×4): qty 1

## 2018-07-30 MED ORDER — SODIUM CHLORIDE 0.9 % IV SOLN: 250.0000 mL | INTRAVENOUS | Status: DC | PRN

## 2018-07-30 MED ORDER — VITAMIN D3 25 MCG (1000 UNIT) PO TABS
2000.0000 [IU] | ORAL_TABLET | Freq: Every day | ORAL | Status: DC
  Administered 2018-07-31 – 2018-08-03 (×4): 2000 [IU] via ORAL
  Filled 2018-07-30 (×8): qty 2

## 2018-07-30 MED ORDER — PRAVASTATIN SODIUM 20 MG PO TABS
40.0000 mg | ORAL_TABLET | Freq: Every day | ORAL | Status: DC
  Administered 2018-07-31 – 2018-08-03 (×4): 40 mg via ORAL
  Filled 2018-07-30 (×4): qty 2

## 2018-07-30 MED ORDER — SODIUM CHLORIDE 0.9% FLUSH: 3.0000 mL | INTRAVENOUS | Status: DC | PRN

## 2018-07-30 MED ORDER — SODIUM CHLORIDE 0.9% FLUSH
3.0000 mL | Freq: Two times a day (BID) | INTRAVENOUS | Status: DC
  Administered 2018-07-30 – 2018-08-03 (×8): 3 mL via INTRAVENOUS

## 2018-07-30 MED ORDER — ADULT MULTIVITAMIN W/MINERALS CH
1.0000 | ORAL_TABLET | Freq: Every day | ORAL | Status: DC
  Administered 2018-07-31 – 2018-08-03 (×4): 1 via ORAL
  Filled 2018-07-30 (×4): qty 1

## 2018-07-30 MED ORDER — MELATONIN 5 MG PO TABS
5.0000 mg | ORAL_TABLET | Freq: Every day | ORAL | Status: DC
  Administered 2018-07-31 – 2018-08-02 (×3): 5 mg via ORAL
  Filled 2018-07-30 (×5): qty 1

## 2018-07-30 MED ORDER — ACETAMINOPHEN 325 MG PO TABS: 650.0000 mg | ORAL_TABLET | ORAL | Status: DC | PRN

## 2018-07-30 MED ORDER — HEPARIN SODIUM (PORCINE) 5000 UNIT/ML IJ SOLN
5000.0000 [IU] | Freq: Three times a day (TID) | INTRAMUSCULAR | Status: DC
  Administered 2018-07-30 – 2018-08-03 (×11): 5000 [IU] via SUBCUTANEOUS
  Filled 2018-07-30 (×11): qty 1

## 2018-07-30 MED ORDER — ESCITALOPRAM OXALATE 10 MG PO TABS
10.0000 mg | ORAL_TABLET | Freq: Every day | ORAL | Status: DC
  Administered 2018-07-31 – 2018-08-03 (×4): 10 mg via ORAL
  Filled 2018-07-30 (×5): qty 1

## 2018-07-30 MED ORDER — FUROSEMIDE 10 MG/ML IJ SOLN
20.0000 mg | Freq: Once | INTRAMUSCULAR | Status: AC
  Administered 2018-07-30: 20 mg via INTRAVENOUS
  Filled 2018-07-30: qty 4

## 2018-07-30 MED ORDER — LISINOPRIL 20 MG PO TABS
20.0000 mg | ORAL_TABLET | Freq: Every day | ORAL | Status: DC
  Administered 2018-07-31 – 2018-08-03 (×4): 20 mg via ORAL
  Filled 2018-07-30 (×4): qty 1

## 2018-07-30 MED ORDER — TRAZODONE HCL 50 MG PO TABS
50.0000 mg | ORAL_TABLET | Freq: Every day | ORAL | Status: DC
  Administered 2018-07-31 – 2018-08-02 (×3): 50 mg via ORAL
  Filled 2018-07-30 (×3): qty 1

## 2018-07-30 MED ORDER — OMEGA-3-ACID ETHYL ESTERS 1 G PO CAPS
2.0000 g | ORAL_CAPSULE | Freq: Two times a day (BID) | ORAL | Status: DC
  Administered 2018-07-31 – 2018-08-03 (×7): 2 g via ORAL
  Filled 2018-07-30 (×7): qty 2

## 2018-07-30 MED ORDER — LATANOPROST 0.005 % OP SOLN
1.0000 [drp] | Freq: Every day | OPHTHALMIC | Status: DC
  Administered 2018-07-30 – 2018-08-02 (×4): 1 [drp] via OPHTHALMIC
  Filled 2018-07-30 (×2): qty 2.5

## 2018-07-30 MED ORDER — VITAMIN B-12 1000 MCG PO TABS
1000.0000 ug | ORAL_TABLET | Freq: Every day | ORAL | Status: DC
  Administered 2018-07-31 – 2018-08-03 (×4): 1000 ug via ORAL
  Filled 2018-07-30 (×4): qty 1

## 2018-07-30 MED ORDER — IPRATROPIUM-ALBUTEROL 0.5-2.5 (3) MG/3ML IN SOLN
3.0000 mL | Freq: Once | RESPIRATORY_TRACT | Status: AC
  Administered 2018-07-30: 3 mL via RESPIRATORY_TRACT
  Filled 2018-07-30: qty 9

## 2018-07-30 MED ORDER — METOPROLOL TARTRATE 25 MG PO TABS
25.0000 mg | ORAL_TABLET | Freq: Two times a day (BID) | ORAL | Status: DC
  Administered 2018-07-31 – 2018-08-03 (×7): 25 mg via ORAL
  Filled 2018-07-30 (×7): qty 1

## 2018-07-30 MED ORDER — FUROSEMIDE 10 MG/ML IJ SOLN
40.0000 mg | Freq: Two times a day (BID) | INTRAMUSCULAR | Status: DC
  Administered 2018-07-30 – 2018-08-01 (×4): 40 mg via INTRAVENOUS
  Filled 2018-07-30 (×4): qty 4

## 2018-07-30 MED ORDER — ONDANSETRON HCL 4 MG/2ML IJ SOLN: 4.0000 mg | Freq: Four times a day (QID) | INTRAMUSCULAR | Status: DC | PRN

## 2018-07-30 NOTE — Progress Notes (Signed)
Subjective:    Patient ID: Virginia Walters, female    DOB: [birth year], 77 y.o.   MRN: [id number]  HPI  Patient recently was admitted to the hospital with acute shortness of breath.  She was found to have a hemoglobin of 5.3!.  She received 2 units of blood and her hemoglobin at discharge was 7.8.  EGD was performed and revealed gastric ulcers and erosions that were thought to be the source of the bleeding.  Presents today with her family member due to altered mental status.  Over the weekend, the patient left her home in the afternoon wearing nothing but her night gown and diaper.  She walked to a neighbor's house.  She could not get them at the door and therefore she sat in their car wrapped up in their coats until someone came home.  She was too confused to go back to her own house that she had just walked from.  They called her family member who came and got her.  Today the patient seems very lethargic.  Her reaction time is extremely slow.  I performed a quick Mini-Mental status exam.  She is able to correctly tell me the date.  However she can only remember 2 out of 3 objects on recall.  She cannot perform serial sevens.  On a clock drawing, she draws a circle.  She draws the numbers outside the circle and incorrectly spaced.  She cannot even draw hands on a clock.  She has a difficult time even answering my questions.  She has increased respiratory rate.  Pulse oximetry on room air is 89%.  On pulmonary exam, both lungs are congested with pronounced bibasilar crackles concerning for possible bilateral pneumonia. Past Medical History:  Diagnosis Date  . Dyslipidemia   . Frequent falls   . GERD (gastroesophageal reflux disease)   . HTN (hypertension)   . Memory loss   . PFO (patent foramen ovale)   . Retinal artery occlusion   . Weakness    Past Surgical History:  Procedure Laterality Date  . ANKLE ARTHROSCOPY WITH OPEN REDUCTION INTERNAL FIXATION (ORIF) Right   . APPENDECTOMY    .  COLONOSCOPY N/A 07/21/2018   Procedure: COLONOSCOPY;  Surgeon: Lin Landsman, MD;  Location: [facility] ENDOSCOPY;  Service: Gastroenterology;  Laterality: N/A;  . ESOPHAGOGASTRODUODENOSCOPY N/A 07/21/2018   Procedure: ESOPHAGOGASTRODUODENOSCOPY (EGD);  Surgeon: Lin Landsman, MD;  Location: [facility] ENDOSCOPY;  Service: Gastroenterology;  Laterality: N/A;  . TUBAL LIGATION     Current Outpatient Medications on File Prior to Visit  Medication Sig Dispense Refill  . acamprosate (CAMPRAL) 333 MG tablet TAKE 1 TABLET BY MOUTH 3  TIMES DAILY WITH MEALS (Patient taking differently: Take 333 mg by mouth 3 (three) times daily. ) 270 tablet 1  . B Complex-Biotin-FA (B-COMPLEX PO) Take 1 capsule by mouth daily.     . Calcium Carbonate-Vitamin D (CALCIUM + D PO) Take 1 capsule by mouth 2 (two) times daily.     . Cholecalciferol (VITAMIN D) 2000 UNITS CAPS Take 1 capsule by mouth daily.     . dorzolamide (TRUSOPT) 2 % ophthalmic solution Place 1 drop into the left eye 2 (two) times daily.     Marland Kitchen escitalopram (LEXAPRO) 10 MG tablet TAKE 1 TABLET BY MOUTH  DAILY (Patient taking differently: Take 10 mg by mouth daily. ) 90 tablet 1  . fish oil-omega-3 fatty acids 1000 MG capsule Take 2 g by mouth 2 (two) times daily.      Marland Kitchen  folic acid (FOLVITE) 1 MG tablet Take 1 mg by mouth daily.    Marland Kitchen latanoprost (XALATAN) 0.005 % ophthalmic solution Place 1 drop into the left eye at bedtime.     Marland Kitchen lisinopril (PRINIVIL,ZESTRIL) 20 MG tablet Take 1 tablet (20 mg total) by mouth daily. 90 tablet 3  . MELATONIN PO Take 5 mg by mouth at bedtime.     . mirabegron ER (MYRBETRIQ) 25 MG TB24 tablet Take 1 tablet (25 mg total) by mouth daily. 30 tablet 5  . mometasone (ELOCON) 0.1 % ointment Apply topically daily. 45 g 1  . Multiple Vitamin (MULTIVITAMIN) tablet Take 1 tablet by mouth daily.      . pantoprazole (PROTONIX) 40 MG tablet Take 1 tablet (40 mg total) by mouth 2 (two) times daily. 60 tablet 3  . pravastatin (PRAVACHOL)  40 MG tablet TAKE 1 TABLET BY MOUTH  DAILY (Patient taking differently: Take 40 mg by mouth daily. ) 90 tablet 1  . traZODone (DESYREL) 50 MG tablet TAKE 1 TABLET BY MOUTH AT  BEDTIME (Patient taking differently: Take 50 mg by mouth at bedtime. ) 90 tablet 0  . TURMERIC PO Take 1 capsule by mouth daily.    . verapamil (CALAN-SR) 240 MG CR tablet TAKE 1 TABLET BY MOUTH AT  BEDTIME (Patient taking differently: Take 240 mg by mouth at bedtime. ) 90 tablet 2  . vitamin B-12 (CYANOCOBALAMIN) 1000 MCG tablet Take 1,000 mcg by mouth daily.    Marland Kitchen VITAMIN E PO Take 180 mg by mouth daily.      No current facility-administered medications on file prior to visit.    Allergies  Allergen Reactions  . Oxycodone Itching   Social History   Socioeconomic History  . Marital status: Widowed    Spouse name: Not on file  . Number of children: 1  . Years of education: HS  . Highest education level: Not on file  Occupational History  . Occupation: Retired  Scientific laboratory technician  . Financial resource strain: Not on file  . Food insecurity:    Worry: Not on file    Inability: Not on file  . Transportation needs:    Medical: Not on file    Non-medical: Not on file  Tobacco Use  . Smoking status: Former Smoker    Packs/day: 1.00    Years: 15.00    Pack years: 15.00    Types: Cigarettes    Last attempt to quit: 12/27/1980    Years since quitting: 37.6  . Smokeless tobacco: Never Used  Substance and Sexual Activity  . Alcohol use: Yes    Comment: occassionally  . Drug use: No  . Sexual activity: Not Currently  Lifestyle  . Physical activity:    Days per week: Not on file    Minutes per session: Not on file  . Stress: Not on file  Relationships  . Social connections:    Talks on phone: Not on file    Gets together: Not on file    Attends religious service: Not on file    Active member of club or organization: Not on file    Attends meetings of clubs or organizations: Not on file    Relationship  status: Not on file  . Intimate partner violence:    Fear of current or ex partner: Not on file    Emotionally abused: Not on file    Physically abused: Not on file    Forced sexual activity: Not on file  Other Topics Concern  . Not on file  Social History Narrative   Lives at home alone.   Right-handed.   No daily use of caffeine.     Review of Systems  All other systems reviewed and are negative.      Objective:   Physical Exam Constitutional:      General: She is not in acute distress.    Appearance: She is ill-appearing. She is not toxic-appearing or diaphoretic.  Cardiovascular:     Heart sounds: Normal heart sounds. No murmur.  Pulmonary:     Breath sounds: Examination of the right-lower field reveals rhonchi and rales. Examination of the left-lower field reveals rhonchi and rales. Wheezing, rhonchi and rales present.  Musculoskeletal:     Right lower leg: No edema.     Left lower leg: No edema.  Neurological:     General: No focal deficit present.     Mental Status: She is disoriented and confused.     Cranial Nerves: Cranial nerves are intact.     Sensory: Sensation is intact.     Coordination: Coordination is intact.     Gait: Gait is intact.           Assessment & Plan:  Altered mental status, recent hospital admission for an upper GI bleed and profound anemia, worsening confusion, bibasilar crackles, borderline hypoxemia with a room pulse oximetry of 45%   Patient is certainly not at her baseline.  She is very confused.  I am concerned about delirium possibly due to hospital-acquired pneumonia versus a urinary tract infection.  She is unable to provide a urine sample here at the clinic today.  However given her confusion, the pronounced bibasilar crackles, the borderline hypoxemia, I believe the patient requires emergency room evaluation for possible pneumonia.  I would recommend chest x-ray, urinalysis, and blood work to rule out worsening anemia.  I  recommended that she go to the emergency room and will notify the emergency room of her impending arrival

## 2018-07-30 NOTE — H&P (Signed)
[facility] NAME: Virginia Walters    MR#:  [id number]  DATE OF BIRTH:  08-02-1941  DATE OF ADMISSION:  07/30/2018  PRIMARY CARE PHYSICIAN: Susy Frizzle, MD   REQUESTING/REFERRING PHYSICIAN:   CHIEF COMPLAINT:   Chief Complaint  Patient presents with  . Respiratory Distress    HISTORY OF PRESENT ILLNESS: Virginia Walters  is a 77 y.o. female with a known history of hyperlipidemia, chronic systolic heart failure, PFO,retinal artery occlusion GERD presented to the emergency room for shortness of breath.  Patient's oxygen saturation was around 76%.  She was put on BiPAP and stabilized.  Initially she was given a round of IV Solu-Medrol and nebulization treatments.  No history of any COPD on the old reviewed documented chart.  Patient appears congested and has pedal edema.  IV Lasix has been ordered and being given in the emergency room.  No complaints of chest pain.  Oxygen saturation improved after loading on BiPAP.  Hospitalist service was consulted for further care.  PAST MEDICAL HISTORY:   Past Medical History:  Diagnosis Date  . Dyslipidemia   . Frequent falls   . GERD (gastroesophageal reflux disease)   . HTN (hypertension)   . Memory loss   . PFO (patent foramen ovale)   . Retinal artery occlusion   . Weakness     PAST SURGICAL HISTORY:  Past Surgical History:  Procedure Laterality Date  . ANKLE ARTHROSCOPY WITH OPEN REDUCTION INTERNAL FIXATION (ORIF) Right   . APPENDECTOMY    . COLONOSCOPY N/A 07/21/2018   Procedure: COLONOSCOPY;  Surgeon: Lin Landsman, MD;  Location: [facility] ENDOSCOPY;  Service: Gastroenterology;  Laterality: N/A;  . ESOPHAGOGASTRODUODENOSCOPY N/A 07/21/2018   Procedure: ESOPHAGOGASTRODUODENOSCOPY (EGD);  Surgeon: Lin Landsman, MD;  Location: [facility] ENDOSCOPY;  Service: Gastroenterology;  Laterality: N/A;  . TUBAL LIGATION      SOCIAL HISTORY:  Social History   Tobacco Use  .  Smoking status: Former Smoker    Packs/day: 1.00    Years: 15.00    Pack years: 15.00    Types: Cigarettes    Last attempt to quit: 12/27/1980    Years since quitting: 37.6  . Smokeless tobacco: Never Used  Substance Use Topics  . Alcohol use: Yes    Comment: occassionally    FAMILY HISTORY:  Family History  Problem Relation Age of Onset  . Esophageal cancer Father   . Dementia Mother     DRUG ALLERGIES:  Allergies  Allergen Reactions  . Oxycodone Itching    REVIEW OF SYSTEMS:   CONSTITUTIONAL: No fever, fatigue or weakness.  EYES: No blurred or double vision.  EARS, NOSE, AND THROAT: No tinnitus or ear pain.  RESPIRATORY: No cough, has shortness of breath,  No wheezing or hemoptysis.  CARDIOVASCULAR: No chest pain, orthopnea, edema.  GASTROINTESTINAL: No nausea, vomiting, diarrhea or abdominal pain.  GENITOURINARY: No dysuria, hematuria.  ENDOCRINE: No polyuria, nocturia,  HEMATOLOGY: No anemia, easy bruising or bleeding SKIN: No rash or lesion. MUSCULOSKELETAL: No joint pain or arthritis.   NEUROLOGIC: No tingling, numbness, weakness.  PSYCHIATRY: No anxiety or depression.   MEDICATIONS AT HOME:  Prior to Admission medications   Medication Sig Start Date End Date Taking? Authorizing Provider  B Complex-Biotin-FA (B-COMPLEX PO) Take 1 capsule by mouth daily.    Yes [provider]  Calcium Carbonate-Vitamin D (CALCIUM + D PO) Take 1 capsule by mouth 2 (two) times daily.  Yes [provider]  Cholecalciferol (VITAMIN D) 2000 UNITS CAPS Take 1 capsule by mouth daily.    Yes [provider]  escitalopram (LEXAPRO) 10 MG tablet TAKE 1 TABLET BY MOUTH  DAILY Patient taking differently: Take 10 mg by mouth daily.  05/07/18  Yes Susy Frizzle, MD  fish oil-omega-3 fatty acids 1000 MG capsule Take 2 g by mouth 2 (two) times daily.     Yes [provider]  folic acid (FOLVITE) 1 MG tablet Take 1 mg by mouth daily.   Yes [provider]  latanoprost (XALATAN) 0.005 % ophthalmic solution Place 1 drop into the left eye at bedtime.  12/05/17  Yes [provider]  lisinopril (PRINIVIL,ZESTRIL) 20 MG tablet Take 1 tablet (20 mg total) by mouth daily. 09/04/17  Yes Susy Frizzle, MD  MELATONIN PO Take 5 mg by mouth at bedtime.    Yes [provider]  mometasone (ELOCON) 0.1 % ointment Apply topically daily. 02/16/18  Yes Susy Frizzle, MD  Multiple Vitamin (MULTIVITAMIN) tablet Take 1 tablet by mouth daily.     Yes [provider]  pantoprazole (PROTONIX) 40 MG tablet Take 1 tablet (40 mg total) by mouth 2 (two) times daily. 07/24/18 08/23/18 Yes Susy Frizzle, MD  pravastatin (PRAVACHOL) 40 MG tablet TAKE 1 TABLET BY MOUTH  DAILY Patient taking differently: Take 40 mg by mouth daily.  05/07/18  Yes Susy Frizzle, MD  traZODone (DESYREL) 50 MG tablet TAKE 1 TABLET BY MOUTH AT  BEDTIME Patient taking differently: Take 50 mg by mouth at bedtime.  05/07/18  Yes Susy Frizzle, MD  TURMERIC PO Take 1 capsule by mouth daily.   Yes [provider]  verapamil (CALAN-SR) 240 MG CR tablet TAKE 1 TABLET BY MOUTH AT  BEDTIME Patient taking differently: Take 240 mg by mouth at bedtime.  01/29/18  Yes Susy Frizzle, MD  vitamin B-12 (CYANOCOBALAMIN) 1000 MCG tablet Take 1,000 mcg by mouth daily.   Yes [provider]  VITAMIN E PO Take 180 mg by mouth daily.    Yes [provider]  acamprosate (CAMPRAL) 333 MG tablet TAKE 1 TABLET BY MOUTH 3  TIMES DAILY WITH MEALS Patient not taking: No sig reported 10/16/17   Susy Frizzle, MD  dorzolamide (TRUSOPT) 2 % ophthalmic solution Place 1 drop into the left eye 2 (two) times daily.     [provider]  mirabegron ER (MYRBETRIQ) 25 MG TB24 tablet Take 1 tablet (25 mg total) by mouth daily. Patient not taking: Reported on 07/30/2018 09/21/17   Susy Frizzle, MD      PHYSICAL EXAMINATION:   VITAL SIGNS:  Blood pressure 130/75, pulse 70, resp. rate (!) 41, height 5\' 5"  (1.651 m), weight 102.1 kg, SpO2 100 %.  GENERAL:  77 y.o.-year-old patient lying in the bed with no acute distress.  EYES: Pupils equal, round, reactive to light and accommodation. No scleral icterus. Extraocular muscles intact.  HEENT: Head atraumatic, normocephalic. Oropharynx and nasopharynx clear.  NECK:  Supple, no jugular venous distention. No thyroid enlargement, no tenderness.  LUNGS: Decreased breath sounds bilaterally, bibasilar crepitations heard. No wheeze heard. CARDIOVASCULAR: S1, S2 normal. No murmurs, rubs, or gallops.  ABDOMEN: Soft, nontender, nondistended. Bowel sounds present. No organomegaly or mass.  EXTREMITIES: Has pedal edema,  No cyanosis, or clubbing.  NEUROLOGIC: Cranial nerves II through XII are intact. Muscle strength 5/5 in all extremities. Sensation intact. Gait not checked.  PSYCHIATRIC: The patient is alert and oriented x 3.  SKIN: No obvious rash, lesion, or ulcer.   LABORATORY PANEL:   CBC Recent Labs  Lab 07/30/18 1243  WBC 9.7  HGB 9.3*  HCT 33.4*  PLT 238  MCV 74.1*  MCH 20.6*  MCHC 27.8*  RDW 32.5*  LYMPHSABS 1.3  MONOABS 1.3*  EOSABS 0.1  BASOSABS 0.0   ------------------------------------------------------------------------------------------------------------------  Chemistries  Recent Labs  Lab 07/30/18 1243  NA 140  K 3.4*  CL 106  CO2 26  GLUCOSE 152*  BUN 27*  CREATININE 1.32*  CALCIUM 9.1   ------------------------------------------------------------------------------------------------------------------ estimated creatinine clearance is 42.3 mL/min (A) (by C-G formula based on SCr of 1.32 mg/dL (H)). ------------------------------------------------------------------------------------------------------------------ No results for input(s): TSH, T4TOTAL, T3FREE, THYROIDAB in the last 72 hours.  Invalid input(s): FREET3   Coagulation profile No  results for input(s): INR, PROTIME in the last 168 hours. ------------------------------------------------------------------------------------------------------------------- No results for input(s): DDIMER in the last 72 hours. -------------------------------------------------------------------------------------------------------------------  Cardiac Enzymes Recent Labs  Lab 07/30/18 1243  TROPONINI <0.03   ------------------------------------------------------------------------------------------------------------------ Invalid input(s): POCBNP  ---------------------------------------------------------------------------------------------------------------  Urinalysis    Component Value Date/Time   COLORURINE YELLOW (A) 10/25/2016 1709   APPEARANCEUR CLEAR (A) 10/25/2016 1709   LABSPEC 1.017 10/25/2016 1709   PHURINE 5.0 10/25/2016 1709   GLUCOSEU NEGATIVE 10/25/2016 1709   HGBUR NEGATIVE 10/25/2016 1709   BILIRUBINUR NEGATIVE 10/25/2016 1709   KETONESUR 5 (A) 10/25/2016 1709   PROTEINUR NEGATIVE 10/25/2016 1709   NITRITE NEGATIVE 10/25/2016 1709   LEUKOCYTESUR MODERATE (A) 10/25/2016 1709     RADIOLOGY: Dg Chest Portable 1 View  Result Date: 07/30/2018 CLINICAL DATA:  Shortness of breath and hypoxia EXAM: PORTABLE CHEST 1 VIEW COMPARISON:  07/21/2018 FINDINGS: There is mild bilateral chronic interstitial thickening. There is no focal consolidation. There is no pleural effusion or pneumothorax. There is stable cardiomegaly. The osseous structures are unremarkable. IMPRESSION: No active disease. Electronically Signed   By: Kathreen Devoid   On: 07/30/2018 13:06    EKG: Orders placed or performed during the hospital encounter of 07/30/18  . ED EKG  . ED EKG    IMPRESSION AND PLAN:  77 year old female patient with history of systolic heart failure, GI bleed in the past, hyperlipidemia, PFO, GERD presented to the emergency room for shortness of breath  -Acute hypoxic  respiratory failure Continue BiPAP for respiratory distress Wean BiPAP after IV Lasix has been given Intensivist consultation  -Acute on chronic systolic heart failure exacerbation IV Lasix for diuresis [place] cardiology consultation Check echocardiogram Daily body weight and input output chart Resume cardiac meds  -History of GI bleed/GERD Resume Protonix   -Acute Hypokalemia Potassium supplementation   All the records are reviewed and case discussed with ED provider. Management plans discussed with the patient, family and they are in agreement.  CODE STATUS:Full code Code Status History    Date Active Date Inactive Code Status Order ID Comments User Context   07/20/2018 1545 07/22/2018 1808 Full Code [id number]  Henreitta Leber, MD Inpatient    Advance Directive Documentation     Most Recent Value  Type of Advance Directive  Healthcare Power of War, Living will  Pre-existing out of facility DNR order (yellow form or pink MOST form)  -  "MOST" Form in Place?  -       TOTAL CRITICAL CARE TIME TAKING CARE OF THIS PATIENT: 54 minutes.    Saundra Shelling M.D on 07/30/2018 at 2:09 PM  Between 7am to 6pm -  Pager - [phone]  After 6pm go to [email or web address] - password [email or web address] [facility]  [facility]  Office  [phone]  CC: Primary care physician; Susy Frizzle, MD

## 2018-07-30 NOTE — ED Notes (Signed)
ED TO INPATIENT HANDOFF REPORT  Name/Age/Gender Virginia Walters 77 y.o. female  Code Status Code Status History    Date Active Date Inactive Code Status Order ID Comments User Context   07/20/2018 1545 07/22/2018 1808 Full Code [id number]  Henreitta Leber, MD Inpatient    Advance Directive Documentation     Most Recent Value  Type of Advance Directive  Healthcare Power of Attorney, Living will  Pre-existing out of facility DNR order (yellow form or pink MOST form)  -  "MOST" Form in Place?  -      Home/SNF/Other Home   Chief Complaint diff breathing  Level of Care/Admitting Diagnosis ED Disposition    ED Disposition Condition [place] [place] [100120]  Level of Care: Stepdown [14]  Diagnosis: Respiratory failure [facility]) [[id number]]  Admitting Physician: Saundra Shelling [[id number]]  Attending Physician: Saundra Shelling [[id number]]  Estimated length of stay: past midnight tomorrow  Certification:: I certify this patient will need inpatient services for at least 2 midnights  PT Class (Do Not Modify): Inpatient [101]  PT Acc Code (Do Not Modify): Private [1]       Medical History Past Medical History:  Diagnosis Date  . Dyslipidemia   . Frequent falls   . GERD (gastroesophageal reflux disease)   . HTN (hypertension)   . Memory loss   . PFO (patent foramen ovale)   . Retinal artery occlusion   . Weakness     Allergies Allergies  Allergen Reactions  . Oxycodone Itching    IV Location/Drains/Wounds Patient Lines/Drains/Airways Status   Active Line/Drains/Airways    Name:   Placement date:   Placement time:   Site:   Days:   Peripheral IV 07/30/18   07/30/18    1309    -   less than 1   Peripheral IV 07/30/18 Left Antecubital   07/30/18    1309    Antecubital   less than 1   Airway   07/21/18    0951     9          Labs/Imaging Results for orders placed or performed during the hospital encounter of 07/30/18 (from the  past 48 hour(s))  CBC with Differential/Platelet     Status: Abnormal   Collection Time: 07/30/18 12:43 PM  Result Value Ref Range   WBC 9.7 4.0 - 10.5 K/uL   RBC 4.51 3.87 - 5.11 MIL/uL   Hemoglobin 9.3 (L) 12.0 - 15.0 g/dL   HCT 33.4 (L) 36.0 - 46.0 %   MCV 74.1 (L) 80.0 - 100.0 fL   MCH 20.6 (L) 26.0 - 34.0 pg   MCHC 27.8 (L) 30.0 - 36.0 g/dL   RDW 32.5 (H) 11.5 - 15.5 %   Platelets 238 150 - 400 K/uL   nRBC 0.0 0.0 - 0.2 %   Neutrophils Relative % 71 %   Neutro Abs 6.9 1.7 - 7.7 K/uL   Lymphocytes Relative 13 %   Lymphs Abs 1.3 0.7 - 4.0 K/uL   Monocytes Relative 13 %   Monocytes Absolute 1.3 (H) 0.1 - 1.0 K/uL   Eosinophils Relative 2 %   Eosinophils Absolute 0.1 0.0 - 0.5 K/uL   Basophils Relative 0 %   Basophils Absolute 0.0 0.0 - 0.1 K/uL   WBC Morphology MORPHOLOGY UNREMARKABLE    Immature Granulocytes 1 %   Abs Immature Granulocytes 0.05 0.00 - 0.07 K/uL   Polychromasia PRESENT     Comment:  Performed at [facility], [address] [place], [place] [address]  Basic metabolic panel     Status: Abnormal   Collection Time: 07/30/18 12:43 PM  Result Value Ref Range   Sodium 140 135 - 145 mmol/L   Potassium 3.4 (L) 3.5 - 5.1 mmol/L   Chloride 106 98 - 111 mmol/L   CO2 26 22 - 32 mmol/L   Glucose, Bld 152 (H) 70 - 99 mg/dL   BUN 27 (H) 8 - 23 mg/dL   Creatinine, Ser 1.32 (H) 0.44 - 1.00 mg/dL   Calcium 9.1 8.9 - 10.3 mg/dL   GFR calc non Af Amer 39 (L) >60 mL/min   GFR calc Af Amer 45 (L) >60 mL/min   Anion gap 8 5 - 15    Comment: Performed at [facility], [address] [place], [place] [address]  Troponin I - ONCE - STAT     Status: None   Collection Time: 07/30/18 12:43 PM  Result Value Ref Range   Troponin I <0.03 <0.03 ng/mL    Comment: Performed at [facility], [address] [place], [place] [address]  Brain natriuretic peptide     Status: Abnormal   Collection Time: 07/30/18 12:44 PM  Result Value Ref Range   B  Natriuretic Peptide 152.0 (H) 0.0 - 100.0 pg/mL    Comment: Performed at [facility], [address] [place], [place] [address]  Blood gas, venous     Status: Abnormal   Collection Time: 07/30/18 12:44 PM  Result Value Ref Range   pH, Ven 7.38 7.250 - 7.430   pCO2, Ven 46 44.0 - 60.0 mmHg   pO2, Ven 57.0 (H) 32.0 - 45.0 mmHg   Bicarbonate 27.2 20.0 - 28.0 mmol/L   Acid-Base Excess 1.5 0.0 - 2.0 mmol/L   O2 Saturation 88.6 %   Patient temperature 37.0    Collection site VEIN    Sample type VEIN     Comment: Performed at [facility], [address], [place], [place] [address]   Dg Chest Portable 1 View  Result Date: 07/30/2018 CLINICAL DATA:  Shortness of breath and hypoxia EXAM: PORTABLE CHEST 1 VIEW COMPARISON:  07/21/2018 FINDINGS: There is mild bilateral chronic interstitial thickening. There is no focal consolidation. There is no pleural effusion or pneumothorax. There is stable cardiomegaly. The osseous structures are unremarkable. IMPRESSION: No active disease. Electronically Signed   By: Kathreen Devoid   On: 07/30/2018 13:06    Pending Labs Unresulted Labs (From admission, onward)    Start     Ordered   Signed and Corporate treasurer  Daily,   R     Signed and Held   Signed and Held  CBC  (heparin)  Once,   R    Comments:  Baseline for heparin therapy IF NOT ALREADY DRAWN.  Notify MD if PLT < 100 K.    Signed and Held   Signed and Held  Creatinine, serum  (heparin)  Once,   R    Comments:  Baseline for heparin therapy IF NOT ALREADY DRAWN.    Signed and Held   Signed and Held  Troponin I - Now Then Q6H  Now then every 6 hours,   R     Signed and Held          Vitals/Pain Today's Vitals   07/30/18 1433 07/30/18 1500 07/30/18 1515 07/30/18 1530  BP: (!) 120/40 (!) 124/51  (!) 126/43  Pulse: 63 60 (!) 59 (!) 57  Resp: (!) 24 (!) 34 (!) 28 (!) 29  Temp: 98.6 F (37 C)     TempSrc: Oral     SpO2: 100% 99% 95% 97%  Weight:      Height:       PainSc: 0-No pain       Isolation Precautions No active isolations  Medications Medications  ipratropium-albuterol (DUONEB) 0.5-2.5 (3) MG/3ML nebulizer solution 3 mL (3 mLs Nebulization Given 07/30/18 1251)  ipratropium-albuterol (DUONEB) 0.5-2.5 (3) MG/3ML nebulizer solution 3 mL (3 mLs Nebulization Given 07/30/18 1250)  ipratropium-albuterol (DUONEB) 0.5-2.5 (3) MG/3ML nebulizer solution 3 mL (3 mLs Nebulization Given 07/30/18 1251)  methylPREDNISolone sodium succinate (SOLU-MEDROL) 125 mg/2 mL injection 125 mg (125 mg Intravenous Given 07/30/18 1304)  furosemide (LASIX) injection 20 mg (20 mg Intravenous Given 07/30/18 1438)    Mobility Stretcher

## 2018-07-30 NOTE — ED Triage Notes (Signed)
First Nurse Note:  Sent from PCP for evaluation of hypoxia and pneumonia.  Per report patient's RA sats 89% in office.  Patient refused EMS transport to hospital.

## 2018-07-30 NOTE — ED Notes (Signed)
IV lasix given.

## 2018-07-30 NOTE — ED Notes (Addendum)
BIPAP setting IPAP 12 EPAP 6 rate 12

## 2018-07-30 NOTE — ED Triage Notes (Signed)
Pt presents to ED via POV from PCP office. Pt dx with pneumonia. Pt severely dyspneic upon exertion, initial RA sats 76%, 98% on 6L via [place]. EDP to bedside to evaluate, RR 41.

## 2018-07-30 NOTE — ED Notes (Signed)
Patient tolerating c-pap well. Vss. Bed assigned. Will monitor until transferred to floor.

## 2018-07-30 NOTE — ED Notes (Signed)
First attempt to call report, awaiting call back from receiving nurse [place].

## 2018-07-30 NOTE — ED Notes (Signed)
bld cxs x 2 obtained and sent.

## 2018-07-30 NOTE — Consult Note (Signed)
Name: Virginia Walters MRN: [id number] DOB: 16-Oct-1940     CONSULTATION DATE: 07/30/2018  REFERRING MD :  pyreddy  CHIEF COMPLAINT:  SOB     HISTORY OF PRESENT ILLNESS:  77 yo obese white female seen today for acute severe resp distress Progressive SOB and Increased WOB last several days associated with lower ext edema  Patient placed on biPAP given lasix EF 45% 07/2018  Patient has slight increased WOB, abl;e to tolerate biPAP  PAST MEDICAL HISTORY :   has a past medical history of Dyslipidemia, Frequent falls, GERD (gastroesophageal reflux disease), HTN (hypertension), Memory loss, PFO (patent foramen ovale), Retinal artery occlusion, and Weakness.  has a past surgical history that includes Appendectomy; Tubal ligation; Ankle arthroscopy with open reduction internal fixation (orif) (Right); Esophagogastroduodenoscopy (N/A, 07/21/2018); and Colonoscopy (N/A, 07/21/2018). Prior to Admission medications   Medication Sig Start Date End Date Taking? Authorizing Provider  B Complex-Biotin-FA (B-COMPLEX PO) Take 1 capsule by mouth daily.    Yes [provider]  Calcium Carbonate-Vitamin D (CALCIUM + D PO) Take 1 capsule by mouth 2 (two) times daily.    Yes [provider]  Cholecalciferol (VITAMIN D) 2000 UNITS CAPS Take 1 capsule by mouth daily.    Yes [provider]  escitalopram (LEXAPRO) 10 MG tablet TAKE 1 TABLET BY MOUTH  DAILY Patient taking differently: Take 10 mg by mouth daily.  05/07/18  Yes Susy Frizzle, MD  fish oil-omega-3 fatty acids 1000 MG capsule Take 2 g by mouth 2 (two) times daily.     Yes [provider]  folic acid (FOLVITE) 1 MG tablet Take 1 mg by mouth daily.   Yes [provider]  latanoprost (XALATAN) 0.005 % ophthalmic solution Place 1 drop into the left eye at bedtime.  12/05/17  Yes [provider]  lisinopril (PRINIVIL,ZESTRIL) 20 MG tablet Take 1 tablet (20 mg total) by mouth daily. 09/04/17   Yes Susy Frizzle, MD  MELATONIN PO Take 5 mg by mouth at bedtime.    Yes [provider]  mometasone (ELOCON) 0.1 % ointment Apply topically daily. 02/16/18  Yes Susy Frizzle, MD  Multiple Vitamin (MULTIVITAMIN) tablet Take 1 tablet by mouth daily.     Yes [provider]  pantoprazole (PROTONIX) 40 MG tablet Take 1 tablet (40 mg total) by mouth 2 (two) times daily. 07/24/18 08/23/18 Yes Susy Frizzle, MD  pravastatin (PRAVACHOL) 40 MG tablet TAKE 1 TABLET BY MOUTH  DAILY Patient taking differently: Take 40 mg by mouth daily.  05/07/18  Yes Susy Frizzle, MD  traZODone (DESYREL) 50 MG tablet TAKE 1 TABLET BY MOUTH AT  BEDTIME Patient taking differently: Take 50 mg by mouth at bedtime.  05/07/18  Yes Susy Frizzle, MD  TURMERIC PO Take 1 capsule by mouth daily.   Yes [provider]  verapamil (CALAN-SR) 240 MG CR tablet TAKE 1 TABLET BY MOUTH AT  BEDTIME Patient taking differently: Take 240 mg by mouth at bedtime.  01/29/18  Yes Susy Frizzle, MD  vitamin B-12 (CYANOCOBALAMIN) 1000 MCG tablet Take 1,000 mcg by mouth daily.   Yes [provider]  VITAMIN E PO Take 180 mg by mouth daily.    Yes [provider]  acamprosate (CAMPRAL) 333 MG tablet TAKE 1 TABLET BY MOUTH 3  TIMES DAILY WITH MEALS Patient not taking: No sig reported 10/16/17   Susy Frizzle, MD  dorzolamide (TRUSOPT) 2 % ophthalmic solution Place 1  drop into the left eye 2 (two) times daily.     [provider]  mirabegron ER (MYRBETRIQ) 25 MG TB24 tablet Take 1 tablet (25 mg total) by mouth daily. Patient not taking: Reported on 07/30/2018 09/21/17   Susy Frizzle, MD   Allergies  Allergen Reactions  . Oxycodone Itching    FAMILY HISTORY:  family history includes Dementia in her mother; Esophageal cancer in her father. SOCIAL HISTORY:  reports that she quit smoking about 37 years ago. Her smoking use included cigarettes. She has a 15.00 pack-year  smoking history. She has never used smokeless tobacco. She reports current alcohol use. She reports that she does not use drugs.  REVIEW OF SYSTEMS:   Unable to obtain due to critical illness Limited due to resp distress   VITAL SIGNS: Temp:  [98.1 F (36.7 C)-99 F (37.2 C)] 99 F (37.2 C) (12/16 1639) Pulse Rate:  [57-80] 68 (12/16 1639) Resp:  [20-41] 27 (12/16 1639) BP: (120-145)/(40-76) 145/61 (12/16 1639) SpO2:  [76 %-100 %] 100 % (12/16 1639) Weight:  [102.1 kg-104.5 kg] 104.5 kg (12/16 1639)  Physical Examination:  GENERAL:critically ill appearing, +resp distress HEAD: Normocephalic, atraumatic.  EYES: Pupils equal, round, reactive to light.  No scleral icterus.  MOUTH: Moist mucosal membrane. NECK: Supple. No JVD.  PULMONARY: +rhonchi, +wheezing CARDIOVASCULAR: S1 and S2. Regular rate and rhythm. No murmurs, rubs, or gallops.  GASTROINTESTINAL: Soft, nontender, -distended. No masses. Positive bowel sounds. No hepatosplenomegaly.  MUSCULOSKELETAL: No swelling, clubbing, or edema.  NEUROLOGIC: alert and awake SKIN:intact,warm,dry  I personally reviewed lab work that was obtained in last 24 hrs.    ASSESSMENT / PLAN:  Patient admitted for- CARDIAC FAILURE-sCHF EF 45% -oxygen as needed -Lasix as tolerated -follow up cardiac enzymes as indicated   Severe Hypoxic and Hypercapnic Respiratory Failure -continue biPAP-wean as tolerated -Wean Fio2    ELECTROLYTES -follow labs as needed -replace as needed -pharmacy consultation and following     DVT/GI PRX ordered TRANSFUSIONS AS NEEDED MONITOR FSBS ASSESS the need for LABS as needed  Critical Care Time devoted to patient care services described in this note is 35 minutes.   Overall, patient is critically ill, prognosis is guarded.  Patient with Multiorgan failure and at high risk for cardiac arrest and death.    Corrin Parker, M.D.  [facility]  Medical Director  [place] Director [facility] Cardio-Pulmonary Department

## 2018-07-30 NOTE — Consult Note (Signed)
Cardiology Consultation:   Patient ID: Virginia Walters MRN: [id number]; DOB: 1941/02/14  Admit date: 07/30/2018 Date of Consult: 07/30/2018  Primary Care Provider: Susy Frizzle, MD Primary Cardiologist: No primary care provider on file.  Primary Electrophysiologist:  None   Patient Profile:   Virginia Walters is a 77 y.o. female with a hx of PFO, hypertension, hyperlipidemia, CVA, frequent falls with orthostatic hypotension, anemia with h/o upper GI bleed and GERD, memory loss, depression, history of retinal artery occlusion, paraesthesias, and previous smoker with h/o alcohol abuse who is being seen today for the evaluation of suspected acute on chronic systolic heart failure (EF 45-50%) at the request of Dr. Estanislado Pandy.  History of Present Illness:   Virginia Walters female with PMH as above.  TEE 05/2007 with EF 55 to 60% trivial AI, very small right to left shunt, very small PFO. Seen in 2012 and noted she was under a lot of stress with husband that had recently been diagnosed with dementia.   11/15/2013: Seen by Dr. Acie Fredrickson with documentation reporting she was being followed for some very small patent foramen ovale.  At that point she was not having any further troubles and did not need to be followed on a regular basis.  Recommendations were to continue ASA 81 mg daily.  12/18/2017: Patient admitted for worsening shortness of breath and dizziness for 1 week and found to have upper GI bleed on workup.  She reportedly got lightheaded and dizzy whenever she attempted ambulation. In the ED, she was noted to be significantly anemic with hemoglobin 5.3 and on ROS blood in stool. Chest x-ray findings were suggestive of mild pulmonary edema with vascular congestion.  She received IV Lasix in the ED.  She was ambulated in the ED and not found hypoxic but was symptomatic with pre-syncopal symptoms and therefore admitted with hospitalist service consulted due to a history of frequent falls  and anemia.  On admission, she was transfused with 2 units of packed red blood cells.  Shuvon.  Echocardiogram showed mildly reduced EF 45 to 50%.  Also noted during this echo was mild diffuse hypokinesis, regional wall motion abnormalities not excluded, and doppler parameters consistent with G1DD, Mild MR, mild LAE, increased RV thickness, increase PASP from 30 to 35 mm Hg plus central venous and R atrial pressure, as well as a small pericardial effusion posterior to heart.   On 07/30/2018, she was seen in the office at [facility] shortness of breath and dyspnea on exertion.  Per this documentation, she presented with AMS and not at her baseline with confusion noted, profound anemia, bibasilar crackles, and borderline hypoxia with room pulse ox at 89%.  She was therefore advised to present to [facility] ED for presumed pneumonia with recommendation for further work-up including chest x-ray, urinalysis, and anemia blood panel.  No fevers or chills, chest pain, cough, abdominal pain, emesis, or diarrhea.  In the ED she was found to be tachypneic, hypoxic, anemic, and hypokalemic.  He was also noted to have diffuse wheezing and crackles bilaterally.  Euvolemic on exam per ED documentation. Vitals: BP 130/75, HR 75, RR 41, PO2 76% Labs: Hemoglobin 9.3, hematocrit 33.4, potassium 3.4, glucose 152, creatinine 1.32, BUN 27, EKG: NSR, 72 bpm, prolonged QTC, not significantly changed when compared to prior EKG CXR: Mild bilateral chronic interstitial thickening, no focal consolidation.  Stable cardiomegaly.  No active disease. Meds: He is on 6 L nasal cannula with improvement in saturation.  Did on BiPAP,  a Medrol, DuoNeb's.  Given dose of IV Lasix.  Pending updated echocardiogram and admitted to ICU on BiPAP at time of exam. On exam for initial consultation 12/16, she denied CP or racing heart rate but did report palpitations. She reported her main complaint as SOB. No reported recent melena,  hemoptysis, or hematuria. No reported recent nausea, emesis, or diarrhea.   Past Medical History:  Diagnosis Date  . Dyslipidemia   . Frequent falls   . GERD (gastroesophageal reflux disease)   . HTN (hypertension)   . Memory loss   . PFO (patent foramen ovale)   . Retinal artery occlusion   . Weakness     Past Surgical History:  Procedure Laterality Date  . ANKLE ARTHROSCOPY WITH OPEN REDUCTION INTERNAL FIXATION (ORIF) Right   . APPENDECTOMY    . COLONOSCOPY N/A 07/21/2018   Procedure: COLONOSCOPY;  Surgeon: Lin Landsman, MD;  Location: [facility] ENDOSCOPY;  Service: Gastroenterology;  Laterality: N/A;  . ESOPHAGOGASTRODUODENOSCOPY N/A 07/21/2018   Procedure: ESOPHAGOGASTRODUODENOSCOPY (EGD);  Surgeon: Lin Landsman, MD;  Location: [facility] ENDOSCOPY;  Service: Gastroenterology;  Laterality: N/A;  . TUBAL LIGATION       Home Medications:  Prior to Admission medications   Medication Sig Start Date End Date Taking? Authorizing Provider  B Complex-Biotin-FA (B-COMPLEX PO) Take 1 capsule by mouth daily.    Yes [provider]  Calcium Carbonate-Vitamin D (CALCIUM + D PO) Take 1 capsule by mouth 2 (two) times daily.    Yes [provider]  Cholecalciferol (VITAMIN D) 2000 UNITS CAPS Take 1 capsule by mouth daily.    Yes [provider]  escitalopram (LEXAPRO) 10 MG tablet TAKE 1 TABLET BY MOUTH  DAILY Patient taking differently: Take 10 mg by mouth daily.  05/07/18  Yes Susy Frizzle, MD  fish oil-omega-3 fatty acids 1000 MG capsule Take 2 g by mouth 2 (two) times daily.     Yes [provider]  folic acid (FOLVITE) 1 MG tablet Take 1 mg by mouth daily.   Yes [provider]  latanoprost (XALATAN) 0.005 % ophthalmic solution Place 1 drop into the left eye at bedtime.  12/05/17  Yes [provider]  lisinopril (PRINIVIL,ZESTRIL) 20 MG tablet Take 1 tablet (20 mg total) by mouth daily. 09/04/17  Yes Susy Frizzle, MD    MELATONIN PO Take 5 mg by mouth at bedtime.    Yes [provider]  mometasone (ELOCON) 0.1 % ointment Apply topically daily. 02/16/18  Yes Susy Frizzle, MD  Multiple Vitamin (MULTIVITAMIN) tablet Take 1 tablet by mouth daily.     Yes [provider]  pantoprazole (PROTONIX) 40 MG tablet Take 1 tablet (40 mg total) by mouth 2 (two) times daily. 07/24/18 08/23/18 Yes Susy Frizzle, MD  pravastatin (PRAVACHOL) 40 MG tablet TAKE 1 TABLET BY MOUTH  DAILY Patient taking differently: Take 40 mg by mouth daily.  05/07/18  Yes Susy Frizzle, MD  traZODone (DESYREL) 50 MG tablet TAKE 1 TABLET BY MOUTH AT  BEDTIME Patient taking differently: Take 50 mg by mouth at bedtime.  05/07/18  Yes Susy Frizzle, MD  TURMERIC PO Take 1 capsule by mouth daily.   Yes [provider]  verapamil (CALAN-SR) 240 MG CR tablet TAKE 1 TABLET BY MOUTH AT  BEDTIME Patient taking differently: Take 240 mg by mouth at bedtime.  01/29/18  Yes Susy Frizzle, MD  vitamin B-12 (CYANOCOBALAMIN) 1000 MCG tablet Take 1,000 mcg by  mouth daily.   Yes [provider]  VITAMIN E PO Take 180 mg by mouth daily.    Yes [provider]  acamprosate (CAMPRAL) 333 MG tablet TAKE 1 TABLET BY MOUTH 3  TIMES DAILY WITH MEALS Patient not taking: No sig reported 10/16/17   Susy Frizzle, MD  dorzolamide (TRUSOPT) 2 % ophthalmic solution Place 1 drop into the left eye 2 (two) times daily.     [provider]  mirabegron ER (MYRBETRIQ) 25 MG TB24 tablet Take 1 tablet (25 mg total) by mouth daily. Patient not taking: Reported on 07/30/2018 09/21/17   Susy Frizzle, MD    Inpatient Medications: Scheduled Meds:  Continuous Infusions:  PRN Meds:   Allergies:    Allergies  Allergen Reactions  . Oxycodone Itching    Social History:   Social History   Socioeconomic History  . Marital status: Widowed    Spouse name: Not on file  . Number of children: 1  . Years of  education: HS  . Highest education level: Not on file  Occupational History  . Occupation: Retired  Scientific laboratory technician  . Financial resource strain: Not on file  . Food insecurity:    Worry: Not on file    Inability: Not on file  . Transportation needs:    Medical: Not on file    Non-medical: Not on file  Tobacco Use  . Smoking status: Former Smoker    Packs/day: 1.00    Years: 15.00    Pack years: 15.00    Types: Cigarettes    Last attempt to quit: 12/27/1980    Years since quitting: 37.6  . Smokeless tobacco: Never Used  Substance and Sexual Activity  . Alcohol use: Yes    Comment: occassionally  . Drug use: No  . Sexual activity: Not Currently  Lifestyle  . Physical activity:    Days per week: Not on file    Minutes per session: Not on file  . Stress: Not on file  Relationships  . Social connections:    Talks on phone: Not on file    Gets together: Not on file    Attends religious service: Not on file    Active member of club or organization: Not on file    Attends meetings of clubs or organizations: Not on file    Relationship status: Not on file  . Intimate partner violence:    Fear of current or ex partner: Not on file    Emotionally abused: Not on file    Physically abused: Not on file    Forced sexual activity: Not on file  Other Topics Concern  . Not on file  Social History Narrative   Lives at home alone.   Right-handed.   No daily use of caffeine.    Family History:    Family History  Problem Relation Age of Onset  . Esophageal cancer Father   . Dementia Mother      ROS:  Please see the history of present illness.  Review of Systems  Constitutional: Positive for malaise/fatigue.  Respiratory: Positive for shortness of breath and wheezing.   Cardiovascular: Positive for palpitations, leg swelling and PND. Negative for chest pain and orthopnea.       Chronic R LEE "due to broken bones that were never fixed"  Gastrointestinal: Negative for abdominal  pain, blood in stool, diarrhea, melena, nausea and vomiting.       Denied recent abdominal swelling  Genitourinary: Negative for  hematuria.       In setting of recent GI bleed.  Neurological: Positive for dizziness and weakness.       Per PCP, AMS    All other ROS reviewed and negative.     Physical Exam/Data:   Vitals:   07/30/18 1245 07/30/18 1254 07/30/18 1300 07/30/18 1433  BP:    (!) 120/40  Pulse:   70 63  Resp:    (!) 24  Temp:  98.3 F (36.8 C)  98.6 F (37 C)  TempSrc:  Oral  Oral  SpO2:   100% 100%  Weight: 102.1 kg     Height: 5\' 5"  (1.651 m)      No intake or output data in the 24 hours ending 07/30/18 1500 Filed Weights   07/30/18 1245  Weight: 102.1 kg   Body mass index is 37.44 kg/m.  General:  Well nourished obese female on BiPAP and significantly SOB with assessory muscle use HEENT: On BiPAP Neck: JVD not assessed due to body habitus and on BiPAP Vascular: 2+ radial pulses Cardiac:  normal S1, S2; RRR  Lungs: On BiPAP with wheezing present throughout lung fields and use of accessory muscles with respiration. Abd: Obese, nontender, no hepatomegaly  Ext: Right minimal to 1+ lower extremity edema and greater than left side that patient reported as chronic and d/t broken bones that were never fixed. No associated erythema or pain that would be consistent with DVT.  Musculoskeletal:  No deformities Skin: warm and dry  Neuro:  no focal abnormalities noted  Psych:  Normal affect   EKG: Refer to HPI Telemetry:  Telemetry was personally reviewed and demonstrates:  SR with PVCs and HR 60-70s  CV Studies:   Relevant CV Studies:  2018-07-31 TTE Left ventricle: The cavity size was mildly dilated. Wall   thickness was increased in a pattern of mild LVH. Systolic   function was mildly reduced. The estimated ejection fraction was   in the range of 45% to 50%. Mild diffuse hypokinesis. Regional   wall motion abnormalities cannot be excluded. Doppler  parameters   are consistent with abnormal left ventricular relaxation (grade 1   diastolic dysfunction). Doppler parameters are consistent with   high ventricular filling pressure. - Mitral valve: Mildly calcified annulus. Mildly thickened leaflets   . There was mild regurgitation. - Left atrium: The atrium was mildly dilated. - Right ventricle: The cavity size was normal. Wall thickness was   mildly increased. Systolic function was normal. - Pulmonary arteries: Systolic pressure was mildly increased, in   the range of 30 mm Hg to 35 mm Hg plus central venous/right   atrial pressure. - Pericardium, extracardiac: A small pericardial effusion was   identified posterior to the heart  Laboratory Data:  Chemistry Recent Labs  Lab 07/30/18 1243  NA 140  K 3.4*  CL 106  CO2 26  GLUCOSE 152*  BUN 27*  CREATININE 1.32*  CALCIUM 9.1  GFRNONAA 39*  GFRAA 45*  ANIONGAP 8    No results for input(s): PROT, ALBUMIN, AST, ALT, ALKPHOS, BILITOT in the last 168 hours. Hematology Recent Labs  Lab 07/30/18 1243  WBC 9.7  RBC 4.51  HGB 9.3*  HCT 33.4*  MCV 74.1*  MCH 20.6*  MCHC 27.8*  RDW 32.5*  PLT 238   Cardiac Enzymes Recent Labs  Lab 07/30/18 1243  TROPONINI <0.03   No results for input(s): TROPIPOC in the last 168 hours.  BNP Recent Labs  Lab 07/30/18 1244  BNP 152.0*    DDimer No results for input(s): DDIMER in the last 168 hours.  Radiology/Studies:  Dg Chest Portable 1 View  Result Date: 07/30/2018 CLINICAL DATA:  Shortness of breath and hypoxia EXAM: PORTABLE CHEST 1 VIEW COMPARISON:  07/21/2018 FINDINGS: There is mild bilateral chronic interstitial thickening. There is no focal consolidation. There is no pleural effusion or pneumothorax. There is stable cardiomegaly. The osseous structures are unremarkable. IMPRESSION: No active disease. Electronically Signed   By: Kathreen Devoid   On: 07/30/2018 13:06    Assessment and Plan:   Newly diagnosed acute on  chronic HFrEF -SOB main symptom reported and progressive since 1 week prior to previous 12/6 admission. BNP 152.0 and decreased from 12/6 BNP 634.0. SOB likely multifactorial in the setting of suspected pneumonia with severe anemia (Hgb 9.3) and recent admission due to upper GI bleed. Known HFrEF with most recent EF 07/2018 45% to 50% at recent 12/6 admission and uncontrolled HTN though not significantly volume overloaded on exam. Known elevated PASP with small pericardial effusion posterior to heart present on previous echocardiogram. Chest x-ray  showing mild bilateral chronic interstitial thickening. WBC not elevated consistent with infection / pna but patient reporting chronic cough. Also reported R>L LEE as chronic and without associated erythema or pain.  - Further evaluation for DVT could be considered per IM as no record of unequal LEE in previous documentation. Consider repeat DDimer or CT scan with 07/20/2018 ddimer elevated at 788.51. Could also consider lower extremity ultrasound. -Creatinine 1.32 and elevated from 12/8 1.10 with baseline 0.9-1.0.  Recommend daily BMET given diuresis to monitor renal function and electrolytes. Monitor I's/O.  Daily weights. Repeat AM BMET to reassess continued diuresis as not terribly volume overloaded. Weight 102  104 today 12/16 and likely d/t scale error. No I/O recorded.  -Continue medical management with PTA medications including lisinopril 20 mg, CCB. Possible addition of spironolactone at discharge as hypokalemic at presentation and uncontrolled HTN. Continue BiPAP, nasal cannula, and breathing treatments as needed for continued shortness of breath. As above, consider further workup for PE.  Hypokalemia -3.4 at admission. Replete with goal 4.0 - Follow-up AM BMET -Check magnesium and replete with goal 2.0 -Per IM  Hypertension - BP 145/61. HR 58 and at times bradycardic. Elevated BP likely contributing to exacerbation of HFrEF -Continue medical  management with lisinopril 20mg  daily, verapamil 240mg  daily, and IV diuresis. Titration of antihypertensives limited by HR at this time. Consider addition of spironolactone at discharge given hypokalemic and hypertensive at presentation.  HLD - 02/2017 LDL 64 and at goal - Continue statin therapy with pravastatin 40mg    Anemia - Likely contributing to SOB - Hemoglobin 9.3 and improved from/8 hemoglobin of 7.8 -Continue to monitor with daily CBC -Recommendation for transfusion below 8 -Per IM  PFO - Followed by Dr. Acie Fredrickson in the past for small PFO. Daily ASA discontinued at last admission d/t GIB - AMS per PCP. No infection identified and WBC not elevated. Most recent MR brain on 10/2016 with chronic microvascular ischemia and no acute changes. Pending echo.   For questions or updates, please contact [place] Please consult [email or web address] for contact info under     Signed, Arvil Chaco, PA-C  07/30/2018 3:00 PM

## 2018-07-30 NOTE — ED Provider Notes (Signed)
[facility] Emergency Department Provider Note  ____________________________________________  Time seen: Approximately 12:46 PM  I have reviewed the triage vital signs and the nursing notes.   HISTORY  Chief Complaint Respiratory Distress   HPI Virginia Walters is a 77 y.o. female with a history of anemia, peptic ulcer disease, GERD, new diagnosis of CHF with a EF of 45 to 50%, former smoker who presents for evaluation of shortness of breath.  Patient was admitted to the hospital a week ago for anemia in the setting of upper GI bleed.  Found to have peptic ulcers.  She also had an echocardiogram which showed mildly decreased EF.  She reports that since being discharged home she has had persistent shortness of breath and wheezing.  Today she went for reevaluation with her primary care doctor and she was found to be hypoxic with sats of 89% and she was sent to the emergency room for further evaluation.  No fever or chills, no chest pain, no leg swelling, no weight gain, no cough, no abdominal pain, no vomiting or diarrhea.  Her shortness of breath is severe and constant, worse with minimal exertion.  Past Medical History:  Diagnosis Date  . Dyslipidemia   . Frequent falls   . GERD (gastroesophageal reflux disease)   . HTN (hypertension)   . Memory loss   . PFO (patent foramen ovale)   . Retinal artery occlusion   . Weakness     Patient Active Problem List   Diagnosis Date Noted  . Microcytic anemia 07/20/2018  . 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 Alcohol abuse 10/26/2016  . Memory loss 10/12/2016  . Gait abnormality 10/12/2016  . Orthostatic dizziness 10/12/2016  . Paresthesia 10/12/2016  . Abnormal EKG 12/28/2010  . HTN (hypertension)   . Dyslipidemia   . PFO (patent foramen ovale)   . Retinal artery occlusion   . GERD (gastroesophageal reflux disease)     Past Surgical History:  Procedure Laterality Date  . ANKLE  ARTHROSCOPY WITH OPEN REDUCTION INTERNAL FIXATION (ORIF) Right   . APPENDECTOMY    . COLONOSCOPY N/A 07/21/2018   Procedure: COLONOSCOPY;  Surgeon: Lin Landsman, MD;  Location: [facility] ENDOSCOPY;  Service: Gastroenterology;  Laterality: N/A;  . ESOPHAGOGASTRODUODENOSCOPY N/A 07/21/2018   Procedure: ESOPHAGOGASTRODUODENOSCOPY (EGD);  Surgeon: Lin Landsman, MD;  Location: [facility] ENDOSCOPY;  Service: Gastroenterology;  Laterality: N/A;  . TUBAL LIGATION      Prior to Admission medications   Medication Sig Start Date End Date Taking? Authorizing Provider  B Complex-Biotin-FA (B-COMPLEX PO) Take 1 capsule by mouth daily.    Yes [provider]  Calcium Carbonate-Vitamin D (CALCIUM + D PO) Take 1 capsule by mouth 2 (two) times daily.    Yes [provider]  Cholecalciferol (VITAMIN D) 2000 UNITS CAPS Take 1 capsule by mouth daily.    Yes [provider]  escitalopram (LEXAPRO) 10 MG tablet TAKE 1 TABLET BY MOUTH  DAILY Patient taking differently: Take 10 mg by mouth daily.  05/07/18  Yes Susy Frizzle, MD  fish oil-omega-3 fatty acids 1000 MG capsule Take 2 g by mouth 2 (two) times daily.     Yes [provider]  folic acid (FOLVITE) 1 MG tablet Take 1 mg by mouth daily.   Yes [provider]  latanoprost (XALATAN) 0.005 % ophthalmic solution Place 1 drop into the left eye at bedtime.  12/05/17  Yes [provider]  lisinopril (PRINIVIL,ZESTRIL) 20 MG  tablet Take 1 tablet (20 mg total) by mouth daily. 09/04/17  Yes Susy Frizzle, MD  MELATONIN PO Take 5 mg by mouth at bedtime.    Yes [provider]  mometasone (ELOCON) 0.1 % ointment Apply topically daily. 02/16/18  Yes Susy Frizzle, MD  Multiple Vitamin (MULTIVITAMIN) tablet Take 1 tablet by mouth daily.     Yes [provider]  pantoprazole (PROTONIX) 40 MG tablet Take 1 tablet (40 mg total) by mouth 2 (two) times daily. 07/24/18 08/23/18 Yes Susy Frizzle,  MD  pravastatin (PRAVACHOL) 40 MG tablet TAKE 1 TABLET BY MOUTH  DAILY Patient taking differently: Take 40 mg by mouth daily.  05/07/18  Yes Susy Frizzle, MD  traZODone (DESYREL) 50 MG tablet TAKE 1 TABLET BY MOUTH AT  BEDTIME Patient taking differently: Take 50 mg by mouth at bedtime.  05/07/18  Yes Susy Frizzle, MD  TURMERIC PO Take 1 capsule by mouth daily.   Yes [provider]  verapamil (CALAN-SR) 240 MG CR tablet TAKE 1 TABLET BY MOUTH AT  BEDTIME Patient taking differently: Take 240 mg by mouth at bedtime.  01/29/18  Yes Susy Frizzle, MD  vitamin B-12 (CYANOCOBALAMIN) 1000 MCG tablet Take 1,000 mcg by mouth daily.   Yes [provider]  VITAMIN E PO Take 180 mg by mouth daily.    Yes [provider]  acamprosate (CAMPRAL) 333 MG tablet TAKE 1 TABLET BY MOUTH 3  TIMES DAILY WITH MEALS Patient not taking: No sig reported 10/16/17   Susy Frizzle, MD  dorzolamide (TRUSOPT) 2 % ophthalmic solution Place 1 drop into the left eye 2 (two) times daily.     [provider]  mirabegron ER (MYRBETRIQ) 25 MG TB24 tablet Take 1 tablet (25 mg total) by mouth daily. Patient not taking: Reported on 07/30/2018 09/21/17   Susy Frizzle, MD    Allergies Oxycodone  Family History  Problem Relation Age of Onset  . Esophageal cancer Father   . Dementia Mother     Social History Social History   Tobacco Use  . Smoking status: Former Smoker    Packs/day: 1.00    Years: 15.00    Pack years: 15.00    Types: Cigarettes    Last attempt to quit: 12/27/1980    Years since quitting: 37.6  . Smokeless tobacco: Never Used  Substance Use Topics  . Alcohol use: Yes    Comment: occassionally  . Drug use: No    Review of Systems  Constitutional: Negative for fever. Eyes: Negative for visual changes. ENT: Negative for sore throat. Neck: No neck pain  Cardiovascular: Negative for chest pain. Respiratory: + shortness of breath,  wheezing Gastrointestinal: Negative for abdominal pain, vomiting or diarrhea. Genitourinary: Negative for dysuria. Musculoskeletal: Negative for back pain. Skin: Negative for rash. Neurological: Negative for headaches, weakness or numbness. Psych: No SI or HI  ____________________________________________   PHYSICAL EXAM:  VITAL SIGNS: ED Triage Vitals  Enc Vitals Group     BP 07/30/18 1237 130/75     Pulse Rate 07/30/18 1237 75     Resp 07/30/18 1237 (!) 41     Temp --      Temp src --      SpO2 07/30/18 1237 (!) 76 %     Weight 07/30/18 1245 225 lb (102.1 kg)     Height 07/30/18 1245 5\' 5"  (1.651 m)     Head Circumference --  Peak Flow --      Pain Score 07/30/18 1245 0     Pain Loc --      Pain Edu? --      Excl. in [place]? --     Constitutional: Alert and oriented, moderate respiratory distress. HEENT:      Head: Normocephalic and atraumatic.         Eyes: Conjunctivae are normal. Sclera is non-icteric.       Mouth/Throat: Mucous membranes are moist.       Neck: Supple with no signs of meningismus. Cardiovascular: Regular rate and rhythm. No murmurs, gallops, or rubs. 2+ symmetrical distal pulses are present in all extremities. No JVD. Respiratory: Patient with moderate respiratory distress, hypoxic to 76% on room air, diffuse wheezing and crackles bilaterally, use of accessory muscle of respiration. Gastrointestinal: Soft, non tender, and non distended with positive bowel sounds. No rebound or guarding. Musculoskeletal: Nontender with normal range of motion in all extremities. No edema, cyanosis, or erythema of extremities. Neurologic: Normal speech and language. Face is symmetric. Moving all extremities. No gross focal neurologic deficits are appreciated. Skin: Skin is warm, dry and intact. No rash noted. Psychiatric: Mood and affect are normal. Speech and behavior are normal.  ____________________________________________   LABS (all labs ordered are listed, but  only abnormal results are displayed)  Labs Reviewed  CBC WITH DIFFERENTIAL/PLATELET - Abnormal; Notable for the following components:      Result Value   Hemoglobin 9.3 (*)    HCT 33.4 (*)    MCV 74.1 (*)    MCH 20.6 (*)    MCHC 27.8 (*)    RDW 32.5 (*)    Monocytes Absolute 1.3 (*)    All other components within normal limits  BASIC METABOLIC PANEL - Abnormal; Notable for the following components:   Potassium 3.4 (*)    Glucose, Bld 152 (*)    BUN 27 (*)    Creatinine, Ser 1.32 (*)    GFR calc non Af Amer 39 (*)    GFR calc Af Amer 45 (*)    All other components within normal limits  BLOOD GAS, VENOUS - Abnormal; Notable for the following components:   pO2, Ven 57.0 (*)    All other components within normal limits  TROPONIN I  BRAIN NATRIURETIC PEPTIDE  I-STAT CG4 LACTIC ACID, ED   ____________________________________________  EKG  ED ECG REPORT I, Rudene Re, the attending physician, personally viewed and interpreted this ECG.  Normal sinus rhythm, rate of 72, prolonged QTC, normal axis, no ST elevations or depressions, T wave inversions in anterior and lateral leads.  No significant changes when compared to prior. ____________________________________________  RADIOLOGY  I have personally reviewed the images performed during this visit and I agree with the Radiologist's read.   Interpretation by Radiologist:  Dg Chest Portable 1 View  Result Date: 07/30/2018 CLINICAL DATA:  Shortness of breath and hypoxia EXAM: PORTABLE CHEST 1 VIEW COMPARISON:  07/21/2018 FINDINGS: There is mild bilateral chronic interstitial thickening. There is no focal consolidation. There is no pleural effusion or pneumothorax. There is stable cardiomegaly. The osseous structures are unremarkable. IMPRESSION: No active disease. Electronically Signed   By: Kathreen Devoid   On: 07/30/2018 13:06      ____________________________________________   PROCEDURES  Procedure(s) performed:  None Procedures Critical Care performed: yes  CRITICAL CARE Performed by: Rudene Re  ?  Total critical care time: 35 min  Critical care time was exclusive of separately billable procedures  and treating other patients.  Critical care was necessary to treat or prevent imminent or life-threatening deterioration.  Critical care was time spent personally by me on the following activities: development of treatment plan with patient and/or surrogate as well as nursing, discussions with consultants, evaluation of patient's response to treatment, examination of patient, obtaining history from patient or surrogate, ordering and performing treatments and interventions, ordering and review of laboratory studies, ordering and review of radiographic studies, pulse oximetry and re-evaluation of patient's condition.  ____________________________________________   INITIAL IMPRESSION / ASSESSMENT AND PLAN / ED COURSE   77 y.o. female with a history of anemia, peptic ulcer disease, GERD, new diagnosis of CHF with a EF of 45 to 50%, former smoker who presents for evaluation of shortness of breath.  Patient arrives in moderate respiratory distress, tachypneic, hypoxic, satting 76% on room air, oxygenation improved after placed on 6 L nasal cannula, she has diffuse wheezing and crackles bilaterally.  Looks euvolemic otherwise.  Patient has no history of COPD but smoked for 17 years and has a newly diagnosed CHF.  Patient was last discharged from the hospital 1 week ago but not placed on lasix. Now with new O2 requirement.  Has had no fevers at home and no fevers here.  Differential diagnosis including CHF exacerbation versus COPD exacerbation versus pneumonia.  Patient was started on BiPAP.  Started on DuoNeb's and Solu-Medrol.  Will give a dose of IV Lasix.  Labs, and chest x-ray are pending.    _________________________ 1:44 PM on 07/30/2018 -----------------------------------------  Chest x-ray  showing pulmonary edema worse on the right middle lobe and cardiomegaly.  Patient given 20 mg of IV Lasix.  Discussed with Dr. Estanislado Pandy admission to the ICU for acute respiratory failure due to CHF.   As part of my medical decision making, I reviewed the following data within the [place] notes reviewed and incorporated, Labs reviewed , EKG interpreted , Old EKG reviewed, Old chart reviewed, Radiograph reviewed , Discussed with admitting physician , Notes from prior ED visits and [place] Controlled Substance Database    Pertinent labs & imaging results that were available during my care of the patient were reviewed by me and considered in my medical decision making (see chart for details).    ____________________________________________   FINAL CLINICAL IMPRESSION(S) / ED DIAGNOSES  Final diagnoses:  Acute respiratory failure with hypoxia (HCC)  Acute on chronic congestive heart failure, unspecified heart failure type ([place])  Acute kidney injury superimposed on chronic kidney disease ([place])      NEW MEDICATIONS STARTED DURING THIS VISIT:  ED Discharge Orders    None       Note:  This document was prepared using Dragon voice recognition software and may include unintentional dictation errors.    Rudene Re, MD 07/30/18 [phone]

## 2018-07-30 NOTE — ED Notes (Addendum)
REPORT GIVEN TO RECEIVING NURSE BRITANY

## 2018-07-30 NOTE — ED Notes (Signed)
I-STAT [place] checked and resulted to .71, RN notified

## 2018-07-31 ENCOUNTER — Inpatient Hospital Stay (HOSPITAL_COMMUNITY)
Admit: 2018-07-31 | Discharge: 2018-07-31 | Disposition: A | Discharge status: Home / Self Care | Payer: Medicare Other | Attending: Cardiovascular Disease | Admitting: Cardiovascular Disease

## 2018-07-31 ENCOUNTER — Encounter: Payer: Self-pay | Admitting: Gastroenterology

## 2018-07-31 ENCOUNTER — Inpatient Hospital Stay: Payer: Medicare Other

## 2018-07-31 ENCOUNTER — Ambulatory Visit: Payer: Medicare Other | Admitting: Gastroenterology

## 2018-07-31 DIAGNOSIS — I5033 Acute on chronic diastolic (congestive) heart failure: Secondary | ICD-10-CM

## 2018-07-31 DIAGNOSIS — I5023 Acute on chronic systolic (congestive) heart failure: Secondary | ICD-10-CM

## 2018-07-31 DIAGNOSIS — J9601 Acute respiratory failure with hypoxia: Secondary | ICD-10-CM

## 2018-07-31 LAB — ECHOCARDIOGRAM LIMITED
Height: 67 in
Weight: 3668.45 oz

## 2018-07-31 LAB — TROPONIN I: Troponin I: <0.03 ng/mL (ref <0.03)

## 2018-07-31 LAB — BASIC METABOLIC PANEL
Anion gap: 8 (ref 5–15)
BUN: 32 mg/dL — ABNORMAL HIGH (ref 8–23)
CO2: 26 mmol/L (ref 22–32)
Calcium: 9.1 mg/dL (ref 8.9–10.3)
Chloride: 108 mmol/L (ref 98–111)
Creatinine, Ser: 1.31 mg/dL — ABNORMAL HIGH (ref 0.44–1.00)
GFR calc Af Amer: 45 mL/min — ABNORMAL LOW (ref >60)
GFR calc non Af Amer: 39 mL/min — ABNORMAL LOW (ref >60)
Glucose, Bld: 145 mg/dL — ABNORMAL HIGH (ref 70–99)
Potassium: 4.1 mmol/L (ref 3.5–5.1)
Sodium: 142 mmol/L (ref 135–145)

## 2018-07-31 MED ORDER — METHYLPREDNISOLONE SODIUM SUCC 125 MG IJ SOLR
60.0000 mg | Freq: Two times a day (BID) | INTRAMUSCULAR | Status: DC
  Administered 2018-07-31 – 2018-08-02 (×5): 60 mg via INTRAVENOUS
  Filled 2018-07-31 (×6): qty 2

## 2018-07-31 MED ORDER — IPRATROPIUM-ALBUTEROL 0.5-2.5 (3) MG/3ML IN SOLN
3.0000 mL | RESPIRATORY_TRACT | Status: DC
  Administered 2018-07-31: 3 mL via RESPIRATORY_TRACT
  Filled 2018-07-31 (×2): qty 3

## 2018-07-31 MED ORDER — IPRATROPIUM-ALBUTEROL 0.5-2.5 (3) MG/3ML IN SOLN
3.0000 mL | Freq: Four times a day (QID) | RESPIRATORY_TRACT | Status: DC
  Administered 2018-07-31 – 2018-08-03 (×10): 3 mL via RESPIRATORY_TRACT
  Filled 2018-07-31 (×11): qty 3

## 2018-07-31 NOTE — Progress Notes (Addendum)
Report given to [facility], RN on 1C. Patient being transferred to 1C room 112.   Patient called daughter, Rosalyn Charters and left message with update regarding new room assignment.

## 2018-07-31 NOTE — Care Management Note (Signed)
Case Management Note  Patient Details  Name: Virginia Walters MRN: [id number] Date of Birth: 1940/11/10  Subjective/Objective:    Patient admitted with respiratory failure initially requiring bipap. Patient is now tolerating [place] 2L.  Patient is from home, lives alone with her cat [name].  Her husband died 6 years ago and he has family that lives close by that is supportive.  Patient does not wear oxygen at home and reports that she is very independent and is able to drive.  PCP verified as Dr. Dennard Schaumann.  Patient believes she was discharged too early last admission and never felt back to her normal self.  Patient has a heart failure clinic appointment for January 6th 2020.  Patient does have a scale at home but she only weighs about once a month.  Educated patient that when she is discharged it is important to weigh daily to monitor weight gain.  Patient verbalized understanding.  RNCM will cont to follow to assess for discharge needs. Doran Clay RN BSN [phone]                Action/Plan:   Expected Discharge Date:                  Expected Discharge Plan:     In-House Referral:     Discharge planning Services     Post Acute Care Choice:    Choice offered to:     DME Arranged:    DME Agency:     HH Arranged:    HH Agency:     Status of Service:     If discussed at [place] of Stay Meetings, dates discussed:    Additional Comments:  Shelbie Hutching, RN 07/31/2018, 10:04 AM

## 2018-07-31 NOTE — Progress Notes (Signed)
Pt refused bipap

## 2018-07-31 NOTE — Progress Notes (Signed)
Pt stated that she will not wear bipap tonight at bedtime. She said her breathing is better and doesn't want that machine anymore.

## 2018-07-31 NOTE — Progress Notes (Signed)
Progress Note  Patient Name: Virginia Walters Date of Encounter: 07/31/2018  Primary Cardiologist: New-consult by Fletcher Anon  Subjective   Still some somewhat short of breath but improved since yesterday.  Less wheezing.  No chest pain or edema.  Chronic orthopnea noted.  Inpatient Medications    Scheduled Meds: . cholecalciferol  2,000 Units Oral Daily  . escitalopram  10 mg Oral Daily  . folic acid  1 mg Oral Daily  . furosemide  40 mg Intravenous Q12H  . heparin  5,000 Units Subcutaneous Q8H  . latanoprost  1 drop Left Eye QHS  . lisinopril  20 mg Oral Daily  . Melatonin  5 mg Oral QHS  . metoprolol tartrate  25 mg Oral BID  . multivitamin with minerals  1 tablet Oral Daily  . omega-3 acid ethyl esters  2 g Oral BID  . pantoprazole  40 mg Oral Daily  . potassium chloride  20 mEq Oral Daily  . pravastatin  40 mg Oral Daily  . sodium chloride flush  3 mL Intravenous Q12H  . traZODone  50 mg Oral QHS  . vitamin B-12  1,000 mcg Oral Daily   Continuous Infusions: . sodium chloride     PRN Meds: sodium chloride, acetaminophen, ondansetron (ZOFRAN) IV, sodium chloride flush   Vital Signs    Vitals:   07/31/18 0500 07/31/18 0600 07/31/18 0800 07/31/18 0959  BP: (!) 153/64 (!) 156/68  (!) 152/64  Pulse: 64 (!) 57  65  Resp: (!) 21 (!) 28    Temp: 97.9 F (36.6 C)  97.9 F (36.6 C)   TempSrc: Axillary  Oral   SpO2: 100% 99% 98%   Weight: 104 kg     Height:        Intake/Output Summary (Last 24 hours) at 07/31/2018 1016 Last data filed at 07/31/2018 0500 Gross per 24 hour  Intake -  Output 200 ml  Net -200 ml   Filed Weights   07/30/18 1245 07/30/18 1639 07/31/18 0500  Weight: 102.1 kg 104.5 kg 104 kg    Telemetry    Normal sinus rhythm with PACs - Personally Reviewed  ECG    New tracing- Personally Reviewed  Physical Exam   GEN: No acute distress.   Neck:  JVP difficult to assess but probably 8 to 10 cm. Cardiac: RRR, no murmurs, rubs, or  gallops.  Respiratory:  Fair air movement with diffuse rhonchi and expiratory wheezes. GI: Soft, nontender, non-distended  MS: No edema; No deformity. Neuro:  Nonfocal  Psych: Normal affect   Labs    Chemistry Recent Labs  Lab 07/30/18 1243 07/31/18 0434  NA 140 142  K 3.4* 4.1  CL 106 108  CO2 26 26  GLUCOSE 152* 145*  BUN 27* 32*  CREATININE 1.32* 1.31*  CALCIUM 9.1 9.1  GFRNONAA 39* 39*  GFRAA 45* 45*  ANIONGAP 8 8     Hematology Recent Labs  Lab 07/30/18 1243  WBC 9.7  RBC 4.51  HGB 9.3*  HCT 33.4*  MCV 74.1*  MCH 20.6*  MCHC 27.8*  RDW 32.5*  PLT 238    Cardiac Enzymes Recent Labs  Lab 07/30/18 1243 07/30/18 1735 07/30/18 2235 07/31/18 0434  TROPONINI <0.03 <0.03 <0.03 <0.03   No results for input(s): TROPIPOC in the last 168 hours.   BNP Recent Labs  Lab 07/30/18 1244  BNP 152.0*     DDimer No results for input(s): DDIMER in the last 168 hours.   Radiology  Dg Chest Port 1 View  Result Date: 07/31/2018 CLINICAL DATA:  Acute respiratory failure EXAM: PORTABLE CHEST 1 VIEW COMPARISON:  Portable chest x-ray of July 30, 2018 FINDINGS: The lungs are borderline hypoinflated. There is no focal infiltrate. The interstitial markings are slightly more conspicuous today. The cardiac silhouette remains enlarged. The pulmonary vascularity is slightly more conspicuous. There is calcification in the wall of the aortic arch. IMPRESSION: Findings suggest low-grade CHF with mild interstitial edema. Mild hypoinflation accentuates these findings. No alveolar pneumonia. Thoracic aortic atherosclerosis. Electronically Signed   By: David  Martinique M.D.   On: 07/31/2018 08:21   Dg Chest Portable 1 View  Result Date: 07/30/2018 CLINICAL DATA:  Shortness of breath and hypoxia EXAM: PORTABLE CHEST 1 VIEW COMPARISON:  07/21/2018 FINDINGS: There is mild bilateral chronic interstitial thickening. There is no focal consolidation. There is no pleural effusion or  pneumothorax. There is stable cardiomegaly. The osseous structures are unremarkable. IMPRESSION: No active disease. Electronically Signed   By: Kathreen Devoid   On: 07/30/2018 13:06    Cardiac Studies   TTE (07/22/18): - Left ventricle: The cavity size was mildly dilated. Wall   thickness was increased in a pattern of mild LVH. Systolic   function was mildly reduced. The estimated ejection fraction was   in the range of 45% to 50%. Mild diffuse hypokinesis. Regional   wall motion abnormalities cannot be excluded. Doppler parameters   are consistent with abnormal left ventricular relaxation (grade 1   diastolic dysfunction). Doppler parameters are consistent with   high ventricular filling pressure. - Mitral valve: Mildly calcified annulus. Mildly thickened leaflets   . There was mild regurgitation. - Left atrium: The atrium was mildly dilated. - Right ventricle: The cavity size was normal. Wall thickness was   mildly increased. Systolic function was normal. - Pulmonary arteries: Systolic pressure was mildly increased, in   the range of 30 mm Hg to 35 mm Hg plus central venous/right   atrial pressure. - Pericardium, extracardiac: A small pericardial effusion was   identified posterior to the heart.  Patient Profile     77 y.o. female woman with history of HFpEF (LVEF 45-50% by echo earlier this month), HTN, HLD, CVA, frequent falls with orthostatic hypotension, PFO, and anemia with prior upper GI bleed, admitted with acute hypoxic respiratory failure.  Assessment & Plan    Acute hypoxic respiratory failure Patient improving, now off BiPAP.  She states that her dyspnea and wheezing is getting better.  As noted by Dr. Fletcher Anon yesterday, volume overload is not impressive.  Repeat echocardiogram to exclude interval enlargement of pericardial effusion is pending.  Follow-up limited echocardiogram.  Continue with IV diuresis for net negative fluid goal of approximately 1 L in 24 hours  (incompletely recorded over the last 24 hours).  Recommend treating for underlying obstructive lung disease; I do not believe her symptoms and exam findings are entirely explained by mild volume overload in the setting of mildly reduced LVEF on recent echo.  HFpEF LVEF mildly reduced on recent echo.  Limited repeat echo pending.  Continue diuresis with net negative fluid balance of 1 L in 24 hours.  Continue lisinopril 20 mg daily as well as metoprolol tartrate 25 mg twice daily (if tolerated from a pulmonary standpoint).  For questions or updates, please contact [place] Please consult [email or web address] for contact info under [facility] Cardiology.  Signed, Nelva Bush, MD  07/31/2018, 10:16 AM

## 2018-07-31 NOTE — Progress Notes (Signed)
*  PRELIMINARY RESULTS* Echocardiogram 2D Echocardiogram has been performed. Limited echo  Deland Pretty 07/31/2018, 1:59 PM

## 2018-07-31 NOTE — Consult Note (Signed)
   Name: Virginia Walters MRN: [id number] DOB: [birth year]     CONSULTATION DATE: 07/30/2018  REFERRING MD :  pyreddy  CHIEF COMPLAINT:  Follow up SOB     HISTORY OF PRESENT ILLNESS:  resp status slowly improving Off biPAP this AM Less SOB     Review of Systems:  Gen:  Denies  fever, sweats, chills weigh loss  HEENT: Denies blurred vision, double vision, ear pain, eye pain, hearing loss, nose bleeds, sore throat Cardiac:  No dizziness, chest pain or heaviness, chest tightness,edema, No JVD Resp:   +shortness of breath,-wheezing, -hemoptysis,  Gi: Denies swallowing difficulty, stomach pain, nausea or vomiting, diarrhea, constipation, bowel incontinence Gu:  Denies bladder incontinence, burning urine Ext:   Denies Joint pain, stiffness or swelling Skin: Denies  skin rash, easy bruising or bleeding or hives Endoc:  Denies polyuria, polydipsia , polyphagia or weight change Psych:   Denies depression, insomnia or hallucinations  Other:  All other systems negative   VITAL SIGNS: Temp:  [97.9 F (36.6 C)-99 F (37.2 C)] 97.9 F (36.6 C) (12/17 0800) Pulse Rate:  [54-80] 57 (12/17 0600) Resp:  [20-41] 28 (12/17 0600) BP: (114-156)/(40-76) 156/68 (12/17 0600) SpO2:  [76 %-100 %] 98 % (12/17 0800) FiO2 (%):  [50 %] 50 % (12/16 1900) Weight:  [102.1 kg-104.5 kg] 104 kg (12/17 0500)  Physical Examination:   GENERAL:NAD, no fevers, chills, no weakness no fatigue HEAD: Normocephalic, atraumatic.  EYES: Pupils equal, round, reactive to light. Extraocular muscles intact. No scleral icterus.  MOUTH: Moist mucosal membrane. Dentition intact. No abscess noted.  EAR, NOSE, THROAT: Clear without exudates. No external lesions.  NECK: Supple. No thyromegaly. No nodules. No JVD.  PULMONARY: CTA B/L no wheezing, rhonchi, crackles CARDIOVASCULAR: S1 and S2. Regular rate and rhythm. No murmurs, rubs, or gallops. No edema. Pedal pulses 2+ bilaterally.  GASTROINTESTINAL: Soft, nontender,  nondistended. No masses. Positive bowel sounds. No hepatosplenomegaly.  MUSCULOSKELETAL: No swelling, clubbing, or edema. Range of motion full in all extremities.  NEUROLOGIC: Cranial nerves II through XII are intact. No gross focal neurological deficits. Sensation intact. Reflexes intact.  SKIN: No ulceration, lesions, rashes, or cyanosis. Skin warm and dry. Turgor intact.  PSYCHIATRIC: Mood, affect within normal limits. The patient is awake, alert and oriented x 3. Insight, judgment intact.  ALL OTHER ROS ARE NEGATIVE      I personally reviewed lab work that was obtained in last 24 hrs.    ASSESSMENT / PLAN:  Patient admitted for- CARDIAC FAILURE-Ef 45% -oxygen as needed -Lasix as tolerated -follow up cardiac enzymes as indicated -slowly improving   Severe Hypoxic and Hypercapnic Respiratory Failure -slowly improving Off biPAP   OK to Transfer to gen med loor    Marlane Hirschmann Patricia Pesa, M.D.  [facility]  Medical Director [place] Director [facility] Cardio-Pulmonary Department

## 2018-07-31 NOTE — Progress Notes (Signed)
Pharmacy Electrolyte Monitoring Consult:  Pharmacy consulted to assist in monitoring and replacing electrolytes in this 77 y.o. female admitted on 07/30/2018 with Respiratory Distress   Labs:  Sodium (mmol/L)  Date Value  07/31/2018 142   Potassium (mmol/L)  Date Value  07/31/2018 4.1  07/22/2013 4.2   Magnesium (mg/dL)  Date Value  07/21/2018 2.0   Calcium (mg/dL)  Date Value  07/31/2018 9.1   Albumin (g/dL)  Date Value  07/20/2018 3.8    Assessment/Plan: Patient ordered furosemide 40mg  IV Q12hr.   No replacement warranted at this time. Will obtain follow-up BMP with am labs.   Pharmacy will continue to monitor and adjust per consult.   Simpson,Michael L 07/31/2018 11:08 AM

## 2018-07-31 NOTE — Progress Notes (Signed)
[facility] [place] NAME: Virginia Walters    MR#:  [id number]  DATE OF BIRTH:  [birth year]  SUBJECTIVE:  CHIEF COMPLAINT:   Chief Complaint  Patient presents with  . Respiratory Distress   -Came in with dyspnea, was on BiPAP last night.  Currently weaned to nasal cannula -Still complains of significant cough  REVIEW OF SYSTEMS:  Review of Systems  Constitutional: Positive for malaise/fatigue. Negative for chills and fever.  HENT: Negative for congestion, ear discharge, hearing loss and nosebleeds.   Eyes: Negative for blurred vision and double vision.  Respiratory: Positive for cough, shortness of breath and wheezing.   Cardiovascular: Negative for chest pain and palpitations.  Gastrointestinal: Negative for abdominal pain, constipation, diarrhea, nausea and vomiting.  Genitourinary: Negative for dysuria.  Musculoskeletal: Negative for myalgias.  Neurological: Negative for dizziness, seizures, weakness and headaches.  Psychiatric/Behavioral: Negative for depression.    DRUG ALLERGIES:   Allergies  Allergen Reactions  . Oxycodone Itching    VITALS:  Blood pressure (!) 152/64, pulse 65, temperature 97.9 F (36.6 C), temperature source Oral, resp. rate (!) 28, height 5\' 7"  (1.702 m), weight 104 kg, SpO2 98 %.  PHYSICAL EXAMINATION:  Physical Exam  GENERAL:  77 y.o.-year-old patient lying in the bed with no acute distress.  EYES: Pupils equal, round, reactive to light and accommodation. No scleral icterus. Extraocular muscles intact.  HEENT: Head atraumatic, normocephalic. Oropharynx and nasopharynx clear.  NECK:  Supple, no jugular venous distention. No thyroid enlargement, no tenderness.  LUNGS: Scattered wheezes all over the lung fields and bibasilar crackles.  No use of accessory muscles for breathing CARDIOVASCULAR: S1, S2 normal. No rubs, or gallops.  3/6 systolic murmur is present ABDOMEN: Soft, nontender,  nondistended. Bowel sounds present. No organomegaly or mass.  EXTREMITIES: No pedal edema, cyanosis, or clubbing.  NEUROLOGIC: Cranial nerves II through XII are intact. Muscle strength 5/5 in all extremities. Sensation intact. Gait not checked.  PSYCHIATRIC: The patient is alert and oriented x 3.  SKIN: No obvious rash, lesion, or ulcer.    LABORATORY PANEL:   CBC Recent Labs  Lab 07/30/18 1243  WBC 9.7  HGB 9.3*  HCT 33.4*  PLT 238   ------------------------------------------------------------------------------------------------------------------  Chemistries  Recent Labs  Lab 07/31/18 0434  NA 142  K 4.1  CL 108  CO2 26  GLUCOSE 145*  BUN 32*  CREATININE 1.31*  CALCIUM 9.1   ------------------------------------------------------------------------------------------------------------------  Cardiac Enzymes Recent Labs  Lab 07/31/18 0434  TROPONINI <0.03   ------------------------------------------------------------------------------------------------------------------  RADIOLOGY:  Dg Chest Port 1 View  Result Date: 07/31/2018 CLINICAL DATA:  Acute respiratory failure EXAM: PORTABLE CHEST 1 VIEW COMPARISON:  Portable chest x-ray of July 30, 2018 FINDINGS: The lungs are borderline hypoinflated. There is no focal infiltrate. The interstitial markings are slightly more conspicuous today. The cardiac silhouette remains enlarged. The pulmonary vascularity is slightly more conspicuous. There is calcification in the wall of the aortic arch. IMPRESSION: Findings suggest low-grade CHF with mild interstitial edema. Mild hypoinflation accentuates these findings. No alveolar pneumonia. Thoracic aortic atherosclerosis. Electronically Signed   By: David  Martinique M.D.   On: 07/31/2018 08:21   Dg Chest Portable 1 View  Result Date: 07/30/2018 CLINICAL DATA:  Shortness of breath and hypoxia EXAM: PORTABLE CHEST 1 VIEW COMPARISON:  07/21/2018 FINDINGS: There is mild bilateral  chronic interstitial thickening. There is no focal consolidation. There is no pleural effusion or pneumothorax. There is stable cardiomegaly. The osseous structures  are unremarkable. IMPRESSION: No active disease. Electronically Signed   By: Kathreen Devoid   On: 07/30/2018 13:06    EKG:   Orders placed or performed during the hospital encounter of 07/30/18  . ED EKG  . ED EKG    ASSESSMENT AND PLAN:   77 year old female with past medical history significant for systolic CHF, hypertension, stroke, PFO, anemia of chronic disease and COPD presents to hospital secondary to acute on chronic hypoxic respiratory failure.  1.  Acute hypoxic respiratory failure-secondary to COPD and CHF exacerbation -Appreciate cardiology and pulmonary consults -ECHO with 45% EF- repeat echo pending - iv lasix - will add steroids and nebs -Off BiPAP, continue supplemental oxygen via nasal cannula as tolerated  2.  Hypertension-on lisinopril, metoprolol and Lasix  3.  GERD-Protonix  4.  DVT prophylaxis-subcutaneous heparin  Transfer to floor if stable   All the records are reviewed and case discussed with Care Management/Social Workerr. Management plans discussed with the patient, family and they are in agreement.  CODE STATUS: Full code  TOTAL TIME TAKING CARE OF THIS PATIENT: 38 minutes.   POSSIBLE D/C IN 2 DAYS, DEPENDING ON CLINICAL CONDITION.   Gladstone Lighter M.D on 07/31/2018 at 1:14 PM  Between 7am to 6pm - Pager - [phone]  After 6pm go to [email or web address] - password [email or web address] [facility]  Office  [phone]  CC: Primary care physician; Susy Frizzle, MD

## 2018-08-01 ENCOUNTER — Inpatient Hospital Stay: Payer: Medicare Other

## 2018-08-01 DIAGNOSIS — J441 Chronic obstructive pulmonary disease with (acute) exacerbation: Secondary | ICD-10-CM

## 2018-08-01 DIAGNOSIS — I5043 Acute on chronic combined systolic (congestive) and diastolic (congestive) heart failure: Secondary | ICD-10-CM

## 2018-08-01 LAB — BASIC METABOLIC PANEL
Anion gap: 9 (ref 5–15)
BUN: 44 mg/dL — ABNORMAL HIGH (ref 8–23)
CO2: 27 mmol/L (ref 22–32)
Calcium: 9.2 mg/dL (ref 8.9–10.3)
Chloride: 102 mmol/L (ref 98–111)
Creatinine, Ser: 1.19 mg/dL — ABNORMAL HIGH (ref 0.44–1.00)
GFR calc Af Amer: 51 mL/min — ABNORMAL LOW (ref >60)
GFR calc non Af Amer: 44 mL/min — ABNORMAL LOW (ref >60)
Glucose, Bld: 169 mg/dL — ABNORMAL HIGH (ref 70–99)
Potassium: 3.6 mmol/L (ref 3.5–5.1)
Sodium: 138 mmol/L (ref 135–145)

## 2018-08-01 LAB — PHOSPHORUS: Phosphorus: 3.6 mg/dL (ref 2.5–4.6)

## 2018-08-01 LAB — MAGNESIUM: Magnesium: 2.1 mg/dL (ref 1.7–2.4)

## 2018-08-01 MED ORDER — FUROSEMIDE 40 MG PO TABS
40.0000 mg | ORAL_TABLET | Freq: Every day | ORAL | Status: DC
  Administered 2018-08-02 – 2018-08-03 (×2): 40 mg via ORAL
  Filled 2018-08-01 (×2): qty 1

## 2018-08-01 MED ORDER — POTASSIUM CHLORIDE CRYS ER 20 MEQ PO TBCR
20.0000 meq | EXTENDED_RELEASE_TABLET | Freq: Once | ORAL | Status: AC
  Administered 2018-08-01: 20 meq via ORAL
  Filled 2018-08-01: qty 1

## 2018-08-01 NOTE — Evaluation (Signed)
Occupational Therapy Evaluation Patient Details Name: Virginia Walters MRN: [id number] DOB: 09-26-1940 Today's Date: 08/01/2018    History of Present Illness Pt is a 77 y/o F who presented with SOB.  Pt was placed on BiPAP and has since been weaned to [facility].  Pt's PMH includes COPD, memory loss, R ankle ORIF.    Clinical Impression   Pt seen for OT evaluation this date. Pt was independent in all ADLs and mod indep for mobility (RW in home, [facility] in community), living in a 1 story home with 6 steps to enter. Pt not on O2 at home. On 2 liters of O2 during evaluation. Pt reports becoming easily fatigued or out of breath with minimize exertion, which led to this admission. Pt currently requires PRN minimal assist for LB ADL due to poor activity tolerance. O2 sats decreased to 88% with exertion and coughing, but with cues to utilize learned pursed lip breathing, came up slowly to 92-93% on 2L. Pt educated in energy conservation conservation strategies including pursed lip breathing, activity pacing, home/routines modifications, work simplification, AE/DME, prioritizing of meaningful occupations, and falls prevention. Handout provided. Pt verbalized understanding but would benefit from additional skilled OT services to maximize recall and carryover of learned techniques and facilitate implementation of learned techniques into daily routines. Upon discharge, recommend HHOT services to minimize risk of future SOB and readmissions.      Follow Up Recommendations  Home health OT    Equipment Recommendations  None recommended by OT    Recommendations for Other Services       Precautions / Restrictions Precautions Precautions: Fall;Other (comment) Precaution Comments: monitor O2 Restrictions Weight Bearing Restrictions: No      Mobility Bed Mobility                  Transfers                      Balance                                           ADL either  performed or assessed with clinical judgement   ADL                                         General ADL Comments: PRN MIN A for LB ADL tasks, cues for ECS/PLB, requires rest breaks      Vision Baseline Vision/History: Wears glasses Wears Glasses: At all times Patient Visual Report: No change from baseline       Perception     Praxis      Pertinent Vitals/Pain Pain Assessment: No/denies pain     Hand Dominance Right   Extremity/Trunk Assessment Upper Extremity Assessment Upper Extremity Assessment: Overall WFL for tasks assessed   Lower Extremity Assessment Lower Extremity Assessment: Overall WFL for tasks assessed   Cervical / Trunk Assessment Cervical / Trunk Assessment: Kyphotic   Communication Communication Communication: No difficulties   Cognition Arousal/Alertness: Awake/alert Behavior During Therapy: WFL for tasks assessed/performed Overall Cognitive Status: Within Functional Limits for tasks assessed                                     General  Comments       Exercises Other Exercises Other Exercises: pt educated in ECS including work simplification, activity pacing, pursed lip breathing, home/routines modifications, and AE/DME; handout provided, pt verbalized understanding   Shoulder Instructions      Home Living Family/patient expects to be discharged to:: Private residence Living Arrangements: Alone Available Help at Discharge: Friend(s);Neighbor;Available PRN/intermittently Type of Home: House Home Access: Stairs to enter [employer] of Steps: 6 Entrance Stairs-Rails: Right Home Layout: One level     Bathroom Shower/Tub: Teacher, early years/pre: Standard     Home Equipment: Clinical cytogeneticist - 2 wheels;Cane - single point;Hand held shower head;Wheelchair - manual   Additional Comments: Reports she utilizes RW at home, but is too bulky to take in community. In community takes cane to  get in and out of car to electric scooter      Prior Functioning/Environment Level of Independence: Independent with assistive device(s)        Comments: Pt ambulates with RW in home and cane in community.  Ind with ADLs.          OT Problem List: Decreased activity tolerance;Cardiopulmonary status limiting activity      OT Treatment/Interventions: Self-care/ADL training;Therapeutic exercise;Therapeutic activities;Energy conservation;DME and/or AE instruction;Patient/family education    OT Goals(Current goals can be found in the care plan section) Acute Rehab OT Goals Patient Stated Goal: to go home OT Goal Formulation: With patient Time For Goal Achievement: 08/15/18 Potential to Achieve Goals: Good ADL Goals Pt Will Perform Lower Body Dressing: with modified independence;with adaptive equipment;sit to/from stand(utilizing learned ECS) Pt Will Transfer to Toilet: with supervision;ambulating(LRAD for amb) Additional ADL Goal #1: Pt will verbalize plan to implement at least 3 learned ECS to maximize safety, independence, and minimize risk of SOB/readmission.  OT Frequency: Min 1X/week   Barriers to D/C:            Co-evaluation              AM-PAC OT "6 Clicks" Daily Activity     Outcome Measure Help from another person eating meals?: None Help from another person taking care of personal grooming?: None Help from another person toileting, which includes using toliet, bedpan, or urinal?: A Little Help from another person bathing (including washing, rinsing, drying)?: A Little Help from another person to put on and taking off regular upper body clothing?: None Help from another person to put on and taking off regular lower body clothing?: A Little 6 Click Score: 21   End of Session Equipment Utilized During Treatment: Oxygen Nurse Communication: Other (comment)(Tech - pt assist for linens change and bathing)  Activity Tolerance: Patient tolerated treatment  well Patient left: in bed;with call bell/phone within reach;with bed alarm set  OT Visit Diagnosis: Other abnormalities of gait and mobility (R26.89)                Time: 7616-0737 OT Time Calculation (min): 25 min Charges:  OT General Charges $OT Visit: 1 Visit OT Evaluation $OT Eval Low Complexity: 1 Low OT Treatments $Self Care/Home Management : 8-22 mins  Jeni Salles, MPH, MS, OTR/L ascom [phone] 08/01/18, 5:03 PM

## 2018-08-01 NOTE — Progress Notes (Signed)
Progress Note  Patient Name: Virginia Walters Date of Encounter: 08/01/2018  Primary Cardiologist: New-consult by Fletcher Anon  Subjective   Reports that she continues to have shortness of breath When asked if she was improving, did not seem convinced that she was much better Significant urine output on IV Lasix since admission BUN starting to climb now in the 65s Appears she has been changed to oral Lasix from IV  Her main complaint is she is very wheezy, significant expiratory wheeze Coughing stuff up, dark color Symptoms have been going on for 3 to 4 weeks  Lives alone  Inpatient Medications    Scheduled Meds: . cholecalciferol  2,000 Units Oral Daily  . escitalopram  10 mg Oral Daily  . folic acid  1 mg Oral Daily  . [START ON 08/02/2018] furosemide  40 mg Oral Daily  . heparin  5,000 Units Subcutaneous Q8H  . ipratropium-albuterol  3 mL Nebulization Q6H  . latanoprost  1 drop Left Eye QHS  . lisinopril  20 mg Oral Daily  . Melatonin  5 mg Oral QHS  . methylPREDNISolone (SOLU-MEDROL) injection  60 mg Intravenous Q12H  . metoprolol tartrate  25 mg Oral BID  . multivitamin with minerals  1 tablet Oral Daily  . omega-3 acid ethyl esters  2 g Oral BID  . pantoprazole  40 mg Oral Daily  . potassium chloride  20 mEq Oral Daily  . pravastatin  40 mg Oral Daily  . sodium chloride flush  3 mL Intravenous Q12H  . traZODone  50 mg Oral QHS  . vitamin B-12  1,000 mcg Oral Daily   Continuous Infusions: . sodium chloride     PRN Meds: sodium chloride, acetaminophen, ondansetron (ZOFRAN) IV, sodium chloride flush   Vital Signs    Vitals:   08/01/18 0602 08/01/18 0747 08/01/18 1102 08/01/18 1359  BP:   134/64   Pulse:  66 66 65  Resp:  16 16 18   Temp:   97.8 F (36.6 C)   TempSrc:   Oral   SpO2:  96% 95% 96%  Weight: 99.7 kg     Height:        Intake/Output Summary (Last 24 hours) at 08/01/2018 1806 Last data filed at 08/01/2018 1011 Gross per 24 hour  Intake 840  ml  Output 800 ml  Net 40 ml   Filed Weights   07/30/18 1639 07/31/18 0500 08/01/18 0602  Weight: 104.5 kg 104 kg 99.7 kg    Telemetry    Normal sinus rhythm with PACs - Personally Reviewed  ECG    New tracing- Personally Reviewed  Physical Exam  Constitutional:  oriented to person, place, and time. No distress.  Obese HENT:  Head: Grossly normal Eyes:  no discharge. No scleral icterus.  Neck: No JVD, no carotid bruits  Cardiovascular: Regular rate and rhythm, no murmurs appreciated Pulmonary/Chest: Diffuse expiratory wheezing Abdominal: Soft.  no distension.  no tenderness.  Musculoskeletal: Normal range of motion Neurological:  normal muscle tone. Coordination normal. No atrophy Skin: Skin warm and dry Psychiatric: normal affect, pleasant   Labs    Chemistry Recent Labs  Lab 07/30/18 1243 07/31/18 0434 08/01/18 0429  NA 140 142 138  K 3.4* 4.1 3.6  CL 106 108 102  CO2 26 26 27   GLUCOSE 152* 145* 169*  BUN 27* 32* 44*  CREATININE 1.32* 1.31* 1.19*  CALCIUM 9.1 9.1 9.2  GFRNONAA 39* 39* 44*  GFRAA 45* 45* 51*  ANIONGAP 8 8  9     Hematology Recent Labs  Lab 07/30/18 1243  WBC 9.7  RBC 4.51  HGB 9.3*  HCT 33.4*  MCV 74.1*  MCH 20.6*  MCHC 27.8*  RDW 32.5*  PLT 238    Cardiac Enzymes Recent Labs  Lab 07/30/18 1243 07/30/18 1735 07/30/18 2235 07/31/18 0434  TROPONINI <0.03 <0.03 <0.03 <0.03   No results for input(s): TROPIPOC in the last 168 hours.   BNP Recent Labs  Lab 07/30/18 1244  BNP 152.0*     DDimer No results for input(s): DDIMER in the last 168 hours.   Radiology    Dg Chest 2 View  Result Date: 08/01/2018 CLINICAL DATA:  CHF EXAM: CHEST - 2 VIEW COMPARISON:  07/31/2018 FINDINGS: Cardiomegaly. No confluent airspace opacities, effusions or edema. No acute bony abnormality. IMPRESSION: Cardiomegaly.  No active disease. Electronically Signed   By: Rolm Baptise M.D.   On: 08/01/2018 07:48   Dg Chest Port 1 View  Result  Date: 07/31/2018 CLINICAL DATA:  Acute respiratory failure EXAM: PORTABLE CHEST 1 VIEW COMPARISON:  Portable chest x-ray of July 30, 2018 FINDINGS: The lungs are borderline hypoinflated. There is no focal infiltrate. The interstitial markings are slightly more conspicuous today. The cardiac silhouette remains enlarged. The pulmonary vascularity is slightly more conspicuous. There is calcification in the wall of the aortic arch. IMPRESSION: Findings suggest low-grade CHF with mild interstitial edema. Mild hypoinflation accentuates these findings. No alveolar pneumonia. Thoracic aortic atherosclerosis. Electronically Signed   By: David  Martinique M.D.   On: 07/31/2018 08:21    Cardiac Studies   TTE (07/22/18): - Left ventricle: The cavity size was mildly dilated. Wall   thickness was increased in a pattern of mild LVH. Systolic   function was mildly reduced. The estimated ejection fraction was   in the range of 45% to 50%. Mild diffuse hypokinesis. Regional   wall motion abnormalities cannot be excluded. Doppler parameters   are consistent with abnormal left ventricular relaxation (grade 1   diastolic dysfunction). Doppler parameters are consistent with   high ventricular filling pressure. - Mitral valve: Mildly calcified annulus. Mildly thickened leaflets   . There was mild regurgitation. - Left atrium: The atrium was mildly dilated. - Right ventricle: The cavity size was normal. Wall thickness was   mildly increased. Systolic function was normal. - Pulmonary arteries: Systolic pressure was mildly increased, in   the range of 30 mm Hg to 35 mm Hg plus central venous/right   atrial pressure. - Pericardium, extracardiac: A small pericardial effusion was   identified posterior to the heart.  Patient Profile     77 y.o. female woman with history of HFpEF (LVEF 45-50% by echo earlier this month), HTN, HLD, CVA, frequent falls with orthostatic hypotension, PFO, and anemia with prior upper GI  bleed, admitted with acute hypoxic respiratory failure.  Assessment & Plan    Acute hypoxic respiratory failure Less likely CHF, given minimal improvement in symptoms with aggressive diuresis -Diffuse expiratory wheezing more consistent with underlying bronchitis, COPD --Neb treatment, consider steroids Could even consider antibiotics but will defer to medicine service For continued respiratory distress may have to change lisinopril to losartan -Agree with changing IV Lasix to oral Lasix given climbing BUN concerning for prerenal state  HFpEF LVEF mildly reduced on recent echo.  On metoprolol, lisinopril  Incontinence Long history, managed by primary care  Frequent falls Lives alone, chronic weakness May need home PT  [facility] will sign off.   Medication  Recommendations: Medication changes as above Other recommendations (labs, testing, etc): No further testing Follow up as an outpatient: Follow-up with pulmonary and medicine service   Total encounter time more than 25 minutes  Greater than 50% was spent in counseling and coordination of care with the patient    For questions or updates, please contact [place] Please consult [email or web address] for contact info under [facility].  Signed, Ida Rogue, MD  08/01/2018, 6:06 PM

## 2018-08-01 NOTE — Progress Notes (Signed)
[facility] [place] NAME: Virginia Walters    MR#:  [id number]  DATE OF BIRTH:  30-Oct-1940  SUBJECTIVE:  CHIEF COMPLAINT:   Chief Complaint  Patient presents with  . Respiratory Distress   - off bipap, on 2-3l nasal cannula o2 which is acute - still feels dyspneic  REVIEW OF SYSTEMS:  Review of Systems  Constitutional: Negative for chills, fever and malaise/fatigue.  HENT: Negative for congestion, ear discharge, hearing loss and nosebleeds.   Eyes: Negative for blurred vision and double vision.  Respiratory: Positive for cough, shortness of breath and wheezing.   Cardiovascular: Negative for chest pain and palpitations.  Gastrointestinal: Negative for abdominal pain, constipation, diarrhea, nausea and vomiting.  Genitourinary: Negative for dysuria.  Musculoskeletal: Negative for myalgias.  Neurological: Negative for dizziness, seizures, weakness and headaches.  Psychiatric/Behavioral: Negative for depression.    DRUG ALLERGIES:   Allergies  Allergen Reactions  . Oxycodone Itching    VITALS:  Blood pressure 134/64, pulse 66, temperature 97.8 F (36.6 C), temperature source Oral, resp. rate 16, height 5\' 7"  (1.702 m), weight 99.7 kg, SpO2 95 %.  PHYSICAL EXAMINATION:  Physical Exam  GENERAL:  77 y.o.-year-old patient lying in the bed with no acute distress.  EYES: Pupils equal, round, reactive to light and accommodation. No scleral icterus. Extraocular muscles intact.  HEENT: Head atraumatic, normocephalic. Oropharynx and nasopharynx clear.  NECK:  Supple, no jugular venous distention. No thyroid enlargement, no tenderness.  LUNGS: Scattered wheezes all over the lung fields and bibasilar crackles.  No use of accessory muscles for breathing CARDIOVASCULAR: S1, S2 normal. No rubs, or gallops.  3/6 systolic murmur is present ABDOMEN: Soft, nontender, nondistended. Bowel sounds present. No organomegaly or mass.  EXTREMITIES:  No pedal edema, cyanosis, or clubbing.  NEUROLOGIC: Cranial nerves II through XII are intact. Muscle strength 5/5 in all extremities. Sensation intact. Gait not checked.  PSYCHIATRIC: The patient is alert and oriented x 3.  SKIN: No obvious rash, lesion, or ulcer.    LABORATORY PANEL:   CBC Recent Labs  Lab 07/30/18 1243  WBC 9.7  HGB 9.3*  HCT 33.4*  PLT 238   ------------------------------------------------------------------------------------------------------------------  Chemistries  Recent Labs  Lab 08/01/18 0429  NA 138  K 3.6  CL 102  CO2 27  GLUCOSE 169*  BUN 44*  CREATININE 1.19*  CALCIUM 9.2  MG 2.1   ------------------------------------------------------------------------------------------------------------------  Cardiac Enzymes Recent Labs  Lab 07/31/18 0434  TROPONINI <0.03   ------------------------------------------------------------------------------------------------------------------  RADIOLOGY:  Dg Chest 2 View  Result Date: 08/01/2018 CLINICAL DATA:  CHF EXAM: CHEST - 2 VIEW COMPARISON:  07/31/2018 FINDINGS: Cardiomegaly. No confluent airspace opacities, effusions or edema. No acute bony abnormality. IMPRESSION: Cardiomegaly.  No active disease. Electronically Signed   By: Rolm Baptise M.D.   On: 08/01/2018 07:48   Dg Chest Port 1 View  Result Date: 07/31/2018 CLINICAL DATA:  Acute respiratory failure EXAM: PORTABLE CHEST 1 VIEW COMPARISON:  Portable chest x-ray of July 30, 2018 FINDINGS: The lungs are borderline hypoinflated. There is no focal infiltrate. The interstitial markings are slightly more conspicuous today. The cardiac silhouette remains enlarged. The pulmonary vascularity is slightly more conspicuous. There is calcification in the wall of the aortic arch. IMPRESSION: Findings suggest low-grade CHF with mild interstitial edema. Mild hypoinflation accentuates these findings. No alveolar pneumonia. Thoracic aortic atherosclerosis.  Electronically Signed   By: David  Martinique M.D.   On: 07/31/2018 08:21    EKG:  Orders placed or performed during the hospital encounter of 07/30/18  . ED EKG  . ED EKG    ASSESSMENT AND PLAN:   77 year old female with past medical history significant for systolic CHF, hypertension, stroke, PFO, anemia of chronic disease and COPD presents to hospital secondary to acute on chronic hypoxic respiratory failure.  1.  Acute hypoxic respiratory failure-secondary to COPD and CHF exacerbation -Appreciate cardiology and pulmonary consults -ECHO with 45% EF- repeat echo from this admission also about the same. -Continue iv lasix -Also has COPD exacerbation.  Repeat chest x-ray with only cardiomegaly today. -Continue IV steroids and nebs -Continue supplemental oxygen as tolerated.  Patient on room air at home  2.  Hypertension-on lisinopril, metoprolol and Lasix  3.  GERD-Protonix  4.  DVT prophylaxis-subcutaneous heparin  Physical therapy consulted.   All the records are reviewed and case discussed with Care Management/Social Workerr. Management plans discussed with the patient, family and they are in agreement.  CODE STATUS: Full code  TOTAL TIME TAKING CARE OF THIS PATIENT: 38 minutes.   POSSIBLE D/C IN 2 DAYS, DEPENDING ON CLINICAL- CONDITION.   Gladstone Lighter M.D on 08/01/2018 at 1:27 PM  Between 7am to 6pm - Pager - [phone]  After 6pm go to [email or web address] - password [email or web address] [facility]  Office  [phone]  CC: Primary care physician; Susy Frizzle, MD

## 2018-08-01 NOTE — Evaluation (Signed)
Physical Therapy Evaluation Patient Details Name: Virginia Walters MRN: [id number] DOB: 1941-08-04 Today's Date: 08/01/2018   History of Present Illness  Pt is a 77 y/o F who presented with SOB.  Pt was placed on BiPAP and has since been weaned to [facility].  Pt's PMH includes COPD, memory loss, R ankle ORIF.     Clinical Impression  Pt admitted with above diagnosis. Pt currently with functional limitations due to the deficits listed below (see PT Problem List). Virginia Walters was able to ambulate household distance upon evaluation with RW with cues for proper posture and pursed lip breathing.  Pt did require O2 while ambulating (see general comments below for more details). Given that pt has had frequent episodes of SOB and has required O2, recommending OT evaluation for energy conservation techniques. Pt will benefit from skilled PT to increase their independence and safety with mobility to allow discharge to the venue listed below.      Follow Up Recommendations Home health PT    Equipment Recommendations  None recommended by PT    Recommendations for Other Services OT consult(for energy conservation techniques)     Precautions / Restrictions Precautions Precautions: Fall;Other (comment) Precaution Comments: monitor O2 Restrictions Weight Bearing Restrictions: No      Mobility  Bed Mobility               General bed mobility comments: Pt sitting EOB at start of session and in chair at end of session  Transfers Overall transfer level: Needs assistance Equipment used: Rolling walker (2 wheeled) Transfers: Sit to/from Stand Sit to Stand: Supervision         General transfer comment: Supervision for safety and cues for proper hand placement using RW.   Ambulation/Gait Ambulation/Gait assistance: Supervision Gait Distance (Feet): 80 Feet Assistive device: Rolling walker (2 wheeled) Gait Pattern/deviations: Trunk flexed Gait velocity: decreased   General Gait Details:  Pt pushes RW too far ahead resulting in flexed posture.  Cues to remain inside BOS of RW. Cues for pursed lip breathing while ambulating.   Stairs            Wheelchair Mobility    Modified Rankin (Stroke Patients Only)       Balance Overall balance assessment: Needs assistance Sitting-balance support: No upper extremity supported;Feet supported Sitting balance-Leahy Scale: Good     Standing balance support: No upper extremity supported;During functional activity Standing balance-Leahy Scale: Fair Standing balance comment: Pt able to stand statically without UE support but relies on at least 1UE support for dynamic activities                             Pertinent Vitals/Pain Pain Assessment: No/denies pain    Home Living Family/patient expects to be discharged to:: Private residence Living Arrangements: Alone Available Help at Discharge: Friend(s);Neighbor;Available PRN/intermittently Type of Home: House Home Access: Stairs to enter Entrance Stairs-Rails: Right Entrance Stairs-Number of Steps: 6 Home Layout: One level Home Equipment: Clinical cytogeneticist - 2 wheels;Cane - single point;Hand held shower head;Wheelchair - manual      Prior Function Level of Independence: Independent with assistive device(s)         Comments: Pt ambulates with RW in home and cane in community.  Ind with ADLs.       Hand Dominance        Extremity/Trunk Assessment   Upper Extremity Assessment Upper Extremity Assessment: Overall WFL for tasks assessed    Lower  Extremity Assessment Lower Extremity Assessment: Overall WFL for tasks assessed    Cervical / Trunk Assessment Cervical / Trunk Assessment: Kyphotic  Communication   Communication: No difficulties  Cognition Arousal/Alertness: Awake/alert Behavior During Therapy: WFL for tasks assessed/performed Overall Cognitive Status: Within Functional Limits for tasks assessed                                         General Comments General comments (skin integrity, edema, etc.): SpO2 down to 87% on RA after ambulating 40 ft.  Requires titrating up to 3L O2 to improve SpO2 to low to mid 90s.  Pt with SpO2 in mid 90s on 2L at end of session.     Exercises Other Exercises Other Exercises: Cues and practice for pursed lip breathing   Assessment/Plan    PT Assessment Patient needs continued PT services  PT Problem List Decreased activity tolerance;Decreased balance;Cardiopulmonary status limiting activity       PT Treatment Interventions DME instruction;Gait training;Therapeutic activities;Therapeutic exercise;Balance training;Neuromuscular re-education;Patient/family education    PT Goals (Current goals can be found in the Care Plan section)  Acute Rehab PT Goals Patient Stated Goal: to go home PT Goal Formulation: With patient Time For Goal Achievement: 08/15/18 Potential to Achieve Goals: Good    Frequency Min 2X/week   Barriers to discharge        Co-evaluation               AM-PAC PT "6 Clicks" Mobility  Outcome Measure Help needed turning from your back to your side while in a flat bed without using bedrails?: None Help needed moving from lying on your back to sitting on the side of a flat bed without using bedrails?: None Help needed moving to and from a bed to a chair (including a wheelchair)?: A Little Help needed standing up from a chair using your arms (e.g., wheelchair or bedside chair)?: A Little Help needed to walk in hospital room?: A Little Help needed climbing 3-5 steps with a railing? : A Little 6 Click Score: 20    End of Session Equipment Utilized During Treatment: Gait belt;Oxygen Activity Tolerance: Patient tolerated treatment well Patient left: in chair;with call bell/phone within reach;with chair alarm set Nurse Communication: Mobility status;Other (comment)(SpO2) PT Visit Diagnosis: Unsteadiness on feet (R26.81);Other abnormalities of  gait and mobility (R26.89)    Time: 5573-2202 PT Time Calculation (min) (ACUTE ONLY): 30 min   Charges:   PT Evaluation $PT Eval Low Complexity: 1 Low PT Treatments $Gait Training: 8-22 mins        Session was performed by student PT, Belva Crome, and directed, overseen, and documented by this PT.  Collie Siad PT, DPT 08/01/2018, 11:09 AM

## 2018-08-01 NOTE — Progress Notes (Signed)
Pharmacy Electrolyte Monitoring Consult:  Pharmacy consulted to assist in monitoring and replacing electrolytes in this 77 y.o. female admitted on 07/30/2018 with Respiratory Distress   Labs:  Sodium (mmol/L)  Date Value  08/01/2018 138   Potassium (mmol/L)  Date Value  08/01/2018 3.6  07/22/2013 4.2   Magnesium (mg/dL)  Date Value  08/01/2018 2.1   Phosphorus (mg/dL)  Date Value  08/01/2018 3.6   Calcium (mg/dL)  Date Value  08/01/2018 9.2   Albumin (g/dL)  Date Value  07/20/2018 3.8    Assessment/Plan: Patient ordered furosemide 40mg  IV Q12hr.   Patient ordered KCl 20 mEq PO daily. Will order additional KCl 20 mEq PO x 1.  Pharmacy will continue to monitor and adjust per consult.   Virginia Walters, PharmD Pharmacy Resident  08/01/2018 7:13 AM

## 2018-08-01 NOTE — Care Management (Signed)
Physical therapy evaluation completed. Recommending home wioth home health/physical therapy.  Home Health Compare query completed. Discussed home health agencies with Ms. Henken in the room Declining Home Health services at this time. Copy of Home health compare list given to Ms. Garson. Copy placed on chart. Shelbie Ammons RN MSN CCM Care management [phone]

## 2018-08-02 LAB — BASIC METABOLIC PANEL
Anion gap: 7 (ref 5–15)
BUN: 44 mg/dL — ABNORMAL HIGH (ref 8–23)
CO2: 30 mmol/L (ref 22–32)
Calcium: 9 mg/dL (ref 8.9–10.3)
Chloride: 102 mmol/L (ref 98–111)
Creatinine, Ser: 1.16 mg/dL — ABNORMAL HIGH (ref 0.44–1.00)
GFR calc Af Amer: 53 mL/min — ABNORMAL LOW (ref >60)
GFR calc non Af Amer: 45 mL/min — ABNORMAL LOW (ref >60)
Glucose, Bld: 141 mg/dL — ABNORMAL HIGH (ref 70–99)
Potassium: 4.1 mmol/L (ref 3.5–5.1)
Sodium: 139 mmol/L (ref 135–145)

## 2018-08-02 MED ORDER — PREDNISONE 20 MG PO TABS
40.0000 mg | ORAL_TABLET | Freq: Every day | ORAL | Status: DC
  Administered 2018-08-03: 40 mg via ORAL
  Filled 2018-08-02: qty 2

## 2018-08-02 NOTE — Care Management Important Message (Signed)
Important Message  Patient Details  Name: Virginia Walters MRN: [id number] Date of Birth: [birth year]   Medicare Important Message Given:  Yes    Juliann Pulse A Tyneisha Hegeman 08/02/2018, 11:51 AM

## 2018-08-02 NOTE — Progress Notes (Addendum)
Physical Therapy Treatment Patient Details Name: Virginia Walters MRN: [id number] DOB: 1940-10-25 Today's Date: 08/02/2018    History of Present Illness Pt is a 77 y/o F who presented with SOB.  Pt was placed on BiPAP and has since been weaned to [facility].  Pt's PMH includes COPD, memory loss, R ankle ORIF.     PT Comments    Pt in recliner, ready for session.  Pt on room air 89-92% at rest.  Stood and was able to ambulate 80' x 2 with walker and supervision.  Pt required verbal cues to stand fully into walker.  No LOB.  Reports some UE fatigue with gait.  O2 sats monitored and remained 88-92%.  Pt voiced some fear of discharge home and not being ready and returning again.  She is refusing HHPT services but is open to OPPT and would like a referral.     Follow Up Recommendations  Outpatient PT     Equipment Recommendations  None recommended by PT    Recommendations for Other Services       Precautions / Restrictions Precautions Precautions: Fall;Other (comment) Precaution Comments: monitor O2 Restrictions Weight Bearing Restrictions: No    Mobility  Bed Mobility               General bed mobility comments: Pt sitting EOB at start of session and in chair at end of session  Transfers   Equipment used: Rolling walker (2 wheeled) Transfers: Sit to/from Stand Sit to Stand: Supervision            Ambulation/Gait Ambulation/Gait assistance: Supervision Gait Distance (Feet): 80 Feet Assistive device: Rolling walker (2 wheeled) Gait Pattern/deviations: Step-to pattern Gait velocity: decreased   General Gait Details: 80' x 2 -seatedrest due to fatigue   Stairs             Wheelchair Mobility    Modified Rankin (Stroke Patients Only)       Balance Overall balance assessment: Needs assistance Sitting-balance support: No upper extremity supported;Feet supported Sitting balance-Leahy Scale: Good   Postural control: Posterior lean Standing balance  support: No upper extremity supported;During functional activity Standing balance-Leahy Scale: Fair                              Cognition Arousal/Alertness: Awake/alert Behavior During Therapy: WFL for tasks assessed/performed Overall Cognitive Status: Within Functional Limits for tasks assessed                                        Exercises      General Comments        Pertinent Vitals/Pain Pain Assessment: No/denies pain    Home Living                      Prior Function            PT Goals (current goals can now be found in the care plan section) Progress towards PT goals: Progressing toward goals    Frequency    Min 2X/week      PT Plan Discharge plan needs to be updated    Co-evaluation              AM-PAC PT "6 Clicks" Mobility   Outcome Measure  Help needed turning from your back to your side while in a flat  bed without using bedrails?: None Help needed moving from lying on your back to sitting on the side of a flat bed without using bedrails?: None Help needed moving to and from a bed to a chair (including a wheelchair)?: A Little Help needed standing up from a chair using your arms (e.g., wheelchair or bedside chair)?: A Little Help needed to walk in hospital room?: A Little Help needed climbing 3-5 steps with a railing? : A Little 6 Click Score: 20    End of Session Equipment Utilized During Treatment: Gait belt;Oxygen Activity Tolerance: Patient tolerated treatment well Patient left: in chair;with call bell/phone within reach;with chair alarm set;with nursing/sitter in room Nurse Communication: Mobility status;Other (comment)       Time: 5003-7048 PT Time Calculation (min) (ACUTE ONLY): 14 min  Charges:  $Gait Training: 8-22 mins                     Chesley Noon, PTA 08/02/18, 12:29 PM

## 2018-08-02 NOTE — Progress Notes (Signed)
Pharmacy Electrolyte Monitoring Consult:  Pharmacy consulted to assist in monitoring and replacing electrolytes in this 77 y.o. female admitted on 07/30/2018 with Respiratory Distress   Labs:  Sodium (mmol/L)  Date Value  08/02/2018 139   Potassium (mmol/L)  Date Value  08/02/2018 4.1  07/22/2013 4.2   Magnesium (mg/dL)  Date Value  08/01/2018 2.1   Phosphorus (mg/dL)  Date Value  08/01/2018 3.6   Calcium (mg/dL)  Date Value  08/02/2018 9.0   Albumin (g/dL)  Date Value  07/20/2018 3.8    Assessment/Plan: Labs WNL Patient ordered furosemide 40mg  po daily.  Patient ordered KCl 20 mEq PO daily. No further supplementation at this time Pharmacy will continue to monitor and adjust per consult.   Chinita Greenland PharmD Clinical Pharmacist 08/02/2018

## 2018-08-02 NOTE — Progress Notes (Signed)
[facility] [place] NAME: Virginia Walters    MR#:  [id number]  DATE OF BIRTH:  1941-06-04  SUBJECTIVE:  CHIEF COMPLAINT:   Chief Complaint  Patient presents with  . Respiratory Distress  Patient seen and evaluated today Off BiPAP On oxygen via nasal cannula Decreased cough Decreased shortness of breath  REVIEW OF SYSTEMS:  Review of Systems  Constitutional: Negative for chills, fever and malaise/fatigue.  HENT: Negative for congestion, ear discharge, hearing loss and nosebleeds.   Eyes: Negative for blurred vision and double vision.  Respiratory: Positive for wheezing. Negative for shortness of breath.   Cardiovascular: Negative for chest pain and palpitations.  Gastrointestinal: Negative for abdominal pain, constipation, diarrhea, nausea and vomiting.  Genitourinary: Negative for dysuria.  Musculoskeletal: Negative for myalgias.  Neurological: Negative for dizziness, seizures, weakness and headaches.  Psychiatric/Behavioral: Negative for depression.    DRUG ALLERGIES:   Allergies  Allergen Reactions  . Oxycodone Itching    VITALS:  Blood pressure (!) 148/67, pulse (!) 59, temperature (!) 97.5 F (36.4 C), resp. rate 20, height 5\' 7"  (1.702 m), weight 101.1 kg, SpO2 96 %.  PHYSICAL EXAMINATION:  Physical Exam  GENERAL:  77 y.o.-year-old patient lying in the bed with no acute distress.  EYES: Pupils equal, round, reactive to light and accommodation. No scleral icterus. Extraocular muscles intact.  HEENT: Head atraumatic, normocephalic. Oropharynx and nasopharynx clear.  NECK:  Supple, no jugular venous distention. No thyroid enlargement, no tenderness.  LUNGS: Scattered wheezes all over the lung fields and bibasilar crackles.  No use of accessory muscles for breathing CARDIOVASCULAR: S1, S2 normal. No rubs, or gallops.  3/6 systolic murmur is present ABDOMEN: Soft, nontender, nondistended. Bowel sounds present. No  organomegaly or mass.  EXTREMITIES: No pedal edema, cyanosis, or clubbing.  NEUROLOGIC: Cranial nerves II through XII are intact. Muscle strength 5/5 in all extremities. Sensation intact. Gait not checked.  PSYCHIATRIC: The patient is alert and oriented x 3.  SKIN: No obvious rash, lesion, or ulcer.    LABORATORY PANEL:   CBC Recent Labs  Lab 07/30/18 1243  WBC 9.7  HGB 9.3*  HCT 33.4*  PLT 238   ------------------------------------------------------------------------------------------------------------------  Chemistries  Recent Labs  Lab 08/01/18 0429 08/02/18 0403  NA 138 139  K 3.6 4.1  CL 102 102  CO2 27 30  GLUCOSE 169* 141*  BUN 44* 44*  CREATININE 1.19* 1.16*  CALCIUM 9.2 9.0  MG 2.1  --    ------------------------------------------------------------------------------------------------------------------  Cardiac Enzymes Recent Labs  Lab 07/31/18 0434  TROPONINI <0.03   ------------------------------------------------------------------------------------------------------------------  RADIOLOGY:  Dg Chest 2 View  Result Date: 08/01/2018 CLINICAL DATA:  CHF EXAM: CHEST - 2 VIEW COMPARISON:  07/31/2018 FINDINGS: Cardiomegaly. No confluent airspace opacities, effusions or edema. No acute bony abnormality. IMPRESSION: Cardiomegaly.  No active disease. Electronically Signed   By: Rolm Baptise M.D.   On: 08/01/2018 07:48    EKG:   Orders placed or performed during the hospital encounter of 07/30/18  . ED EKG  . ED EKG    ASSESSMENT AND PLAN:   77 year old female with past medical history significant for systolic CHF, hypertension, stroke, PFO, anemia of chronic disease and COPD presents to hospital secondary to acute on chronic hypoxic respiratory failure.  1.  Status post hypoxic respiratory failure-secondary to COPD and CHF exacerbation -Appreciate cardiology and pulmonary consults -ECHO with 45% EF- repeat echo from this admission also about the  same. -switch to oral  Lasix medication -Also has COPD exacerbation.  Repeat chest x-ray with only cardiomegaly today. -Discontinue IV steroids.  Start oral steroids and continue nebulization treatments -Wean oxygen  2.  Hypertension-on lisinopril, metoprolol and Lasix  3.  GERD-Protonix  4.  DVT prophylaxis-subcutaneous heparin  5.  Ambulatory dysfunction Physical therapy evaluation and follow-up to continue for endurance training    All the records are reviewed and case discussed with Care Management/Social Workerr. Management plans discussed with the patient, family and they are in agreement.  CODE STATUS: Full code  TOTAL TIME TAKING CARE OF THIS PATIENT: 35 minutes.   POSSIBLE D/C IN 2 DAYS, DEPENDING ON CLINICAL- CONDITION.   Saundra Shelling M.D on 08/02/2018 at 1:29 PM  Between 7am to 6pm - Pager - [phone]  After 6pm go to [email or web address] - password [email or web address] [facility]  Office  [phone]  CC: Primary care physician; Susy Frizzle, MD

## 2018-08-02 NOTE — Plan of Care (Signed)
  Problem: Education: Goal: Knowledge of General Education information will improve Description Including pain rating scale, medication(s)/side effects and non-pharmacologic comfort measures Outcome: Progressing   Problem: Health Behavior/Discharge Planning: Goal: Ability to manage health-related needs will improve Outcome: Progressing   

## 2018-08-03 LAB — BASIC METABOLIC PANEL
Anion gap: 7 (ref 5–15)
BUN: 42 mg/dL — ABNORMAL HIGH (ref 8–23)
CO2: 29 mmol/L (ref 22–32)
Calcium: 9.1 mg/dL (ref 8.9–10.3)
Chloride: 100 mmol/L (ref 98–111)
Creatinine, Ser: 1.13 mg/dL — ABNORMAL HIGH (ref 0.44–1.00)
GFR calc Af Amer: 54 mL/min — ABNORMAL LOW (ref >60)
GFR calc non Af Amer: 47 mL/min — ABNORMAL LOW (ref >60)
Glucose, Bld: 179 mg/dL — ABNORMAL HIGH (ref 70–99)
Potassium: 4 mmol/L (ref 3.5–5.1)
Sodium: 136 mmol/L (ref 135–145)

## 2018-08-03 MED ORDER — POTASSIUM CHLORIDE CRYS ER 20 MEQ PO TBCR: 20.0000 meq | EXTENDED_RELEASE_TABLET | Freq: Every day | ORAL | 0 refills | Status: DC

## 2018-08-03 MED ORDER — PREDNISONE 10 MG PO TABS: 10.0000 mg | ORAL_TABLET | Freq: Every day | ORAL | 0 refills | Status: DC

## 2018-08-03 MED ORDER — METOPROLOL TARTRATE 25 MG PO TABS: 25.0000 mg | ORAL_TABLET | Freq: Two times a day (BID) | ORAL | 0 refills | Status: DC

## 2018-08-03 NOTE — Progress Notes (Signed)
Pharmacy Electrolyte Monitoring Consult:  Pharmacy consulted to assist in monitoring and replacing electrolytes in this 77 y.o. female admitted on 07/30/2018 with Respiratory Distress   Labs:  Sodium (mmol/L)  Date Value  08/03/2018 136   Potassium (mmol/L)  Date Value  08/03/2018 4.0  07/22/2013 4.2   Magnesium (mg/dL)  Date Value  08/01/2018 2.1   Phosphorus (mg/dL)  Date Value  08/01/2018 3.6   Calcium (mg/dL)  Date Value  08/03/2018 9.1   Albumin (g/dL)  Date Value  07/20/2018 3.8    Assessment/Plan: Labs WNL Patient ordered furosemide 40mg  po daily.  Patient ordered KCl 20 mEq PO daily. No further supplementation at this time Pharmacy will sign off as consult originated in ICU and labs stable/WNL.   Rayna Sexton, PharmD, BCPS Clinical Pharmacist 08/03/2018 1:20 PM

## 2018-08-03 NOTE — Progress Notes (Signed)
Occupational Therapy Treatment Patient Details Name: RAJA CAPUTI MRN: [id number] DOB: Aug 07, 1941 Today's Date: 08/03/2018    History of present illness Pt is a 77 y/o F who presented to [facility] with SOB.  Pt was placed on BiPAP and has since been weaned to [facility].  Pt's PMH includes COPD, memory loss, R ankle ORIF.    OT comments  Pt. Education was provided about energy conservation, and work simplification techniques for ADLs, and  A/E use for LE ADLs. Reviewed the energy conservation, work simplification handout with the patient. Pt. Continues to benefit from OT services for ADL training, A/E training, and pt. education about  energy conservation, and work simplification techniques, home modification, and DME. Pt. Plans to return home upon discharge with family to assist pt. as needed. Pt. could benefit from follow-up [place] services upon discharge.   Follow Up Recommendations  Home health OT    Equipment Recommendations  None recommended by OT    Recommendations for Other Services      Precautions / Restrictions Restrictions Weight Bearing Restrictions: No       Mobility Bed Mobility    Pt. Sitting at EOB upon arrival              Transfers                      Balance                                           ADL either performed or assessed with clinical judgement   ADL                                         General ADL Comments: Pt. edcuationw as provided about enefgy conservation/work simplification skills duirng ADLs.     Vision Baseline Vision/History: Wears glasses Wears Glasses: At all times Patient Visual Report: No change from baseline     Perception     Praxis      Cognition Arousal/Alertness: Awake/alert Behavior During Therapy: WFL for tasks assessed/performed Overall Cognitive Status: Within Functional Limits for tasks assessed                                           Exercises     Shoulder Instructions       General Comments      Pertinent Vitals/ Pain          Home Living                                          Prior Functioning/Environment              Frequency  Min 1X/week        Progress Toward Goals  OT Goals(current goals can now be found in the care plan section)     Acute Rehab OT Goals Patient Stated Goal: To return home OT Goal Formulation: With patient Time For Goal Achievement: 08/15/18  Plan      Co-evaluation  AM-PAC OT "6 Clicks" Daily Activity     Outcome Measure   Help from another person eating meals?: None Help from another person taking care of personal grooming?: None Help from another person toileting, which includes using toliet, bedpan, or urinal?: A Little Help from another person bathing (including washing, rinsing, drying)?: A Little Help from another person to put on and taking off regular upper body clothing?: None Help from another person to put on and taking off regular lower body clothing?: A Little 6 Click Score: 21    End of Session    OT Visit Diagnosis: Unsteadiness on feet (R26.81)   Activity Tolerance Patient tolerated treatment well   Patient Left in bed;with call bell/phone within reach;with bed alarm set   Nurse Communication          Time: 1115-5208 OT Time Calculation (min): 16 min  Charges: OT General Charges $OT Visit: 1 Visit OT Treatments $Self Care/Home Management : 8-22 mins Harrel Carina, MS, OTR/L    Harrel Carina 08/03/2018, 1:07 PM

## 2018-08-03 NOTE — Progress Notes (Signed)
SATURATION QUALIFICATIONS: (This note is used to comply with regulatory documentation for home oxygen)  Patient Saturations on Room Air at Rest = 91%  Patient Saturations on Room Air while Ambulating = 86%  Patient Saturations on 2L Liters of oxygen while Ambulating = 96%  Please briefly explain why patient needs home oxygen:

## 2018-08-03 NOTE — Discharge Summary (Signed)
[place] [facility] [place] NAME: Virginia Walters    MR#:  [id number]  DATE OF BIRTH:  14-Jul-1941  DATE OF ADMISSION:  07/30/2018 ADMITTING PHYSICIAN: Saundra Shelling, MD  DATE OF DISCHARGE: 12/202/2019  PRIMARY CARE PHYSICIAN: Susy Frizzle, MD   ADMISSION DIAGNOSIS:  Acute respiratory failure with hypoxia ([place]) [J96.01] Acute kidney injury superimposed on chronic kidney disease ([place]) [N17.9, N18.9] Acute on chronic congestive heart failure, unspecified heart failure type ([place]) [I50.9] Hypokalemia DISCHARGE DIAGNOSIS:  Acute respiratory failure with hypoxia Acute on chronic systolic heart failure exacerbation Acute COPD exacerbation Hypokalemia Hypertension GERD SECONDARY DIAGNOSIS:   Past Medical History:  Diagnosis Date  . Dyslipidemia   . Frequent falls   . GERD (gastroesophageal reflux disease)   . HTN (hypertension)   . Memory loss   . PFO (patent foramen ovale)   . Retinal artery occlusion   . Weakness      ADMITTING HISTORY Virginia Walters  is a 77 y.o. female with a known history of hyperlipidemia, chronic systolic heart failure, PFO,retinal artery occlusion GERD presented to the emergency room for shortness of breath.  Patient's oxygen saturation was around 76%.  She was put on BiPAP and stabilized.  Initially she was given a round of IV Solu-Medrol and nebulization treatments.  No history of any COPD on the old reviewed documented chart.  Patient appears congested and has pedal edema.  IV Lasix has been ordered and being given in the emergency room.  No complaints of chest pain.  Oxygen saturation improved after loading on BiPAP.  Hospitalist service was consulted for further care.   HOSPITAL COURSE:  Was admitted to stepdown unit for respiratory distress and hypoxia on BiPAP.  Patient was diuresed with IV Lasix and echocardiogram was done and cardiology evaluation.  Potassium was aggressively supplemented during the  hospitalization.  Patient was weaned off BiPAP and transition to oxygen by nasal cannula.  Patient continued beta-blocker, ACE inhibitor and Lasix for heart failure along with electrolyte supplementation.  Patient received physical therapy.  She was offered home health services at the time of discharge.  Patient also received IV Solu-Medrol and aggressive nebulization treatments for COPD exacerbation.  She was transitioned to oral steroids.  Was followed by pulmonary attending and cardiology attending during hospitalization.  Repeat echo showed EF of 45%.  MRSA PCR was negative during the work-up in the hospital .Patient will be discharged home follow-up with cardiology in the clinic along with primary care physician.  CONSULTS OBTAINED:  Cardiology Pulmonary attending  DRUG ALLERGIES:   Allergies  Allergen Reactions  . Oxycodone Itching    DISCHARGE MEDICATIONS:   Allergies as of 08/03/2018      Reactions   Oxycodone Itching      Medication List    STOP taking these medications   acamprosate 333 MG tablet Commonly known as:  CAMPRAL   mirabegron ER 25 MG Tb24 tablet Commonly known as:  MYRBETRIQ   verapamil 240 MG CR tablet Commonly known as:  CALAN-SR     TAKE these medications   B-COMPLEX PO Take 1 capsule by mouth daily.   CALCIUM + D PO Take 1 capsule by mouth 2 (two) times daily.   dorzolamide 2 % ophthalmic solution Commonly known as:  TRUSOPT Place 1 drop into the left eye 2 (two) times daily.   escitalopram 10 MG tablet Commonly known as:  LEXAPRO TAKE 1 TABLET BY MOUTH  DAILY   fish oil-omega-3 fatty acids  1000 MG capsule Take 2 g by mouth 2 (two) times daily.   folic acid 1 MG tablet Commonly known as:  FOLVITE Take 1 mg by mouth daily.   latanoprost 0.005 % ophthalmic solution Commonly known as:  XALATAN Place 1 drop into the left eye at bedtime.   lisinopril 20 MG tablet Commonly known as:  PRINIVIL,ZESTRIL Take 1 tablet (20 mg total) by mouth  daily.   MELATONIN PO Take 5 mg by mouth at bedtime.   metoprolol tartrate 25 MG tablet Commonly known as:  LOPRESSOR Take 1 tablet (25 mg total) by mouth 2 (two) times daily.   mometasone 0.1 % ointment Commonly known as:  ELOCON Apply topically daily.   multivitamin tablet Take 1 tablet by mouth daily.   pantoprazole 40 MG tablet Commonly known as:  PROTONIX Take 1 tablet (40 mg total) by mouth 2 (two) times daily.   potassium chloride SA 20 MEQ tablet Commonly known as:  K-DUR,KLOR-CON Take 1 tablet (20 mEq total) by mouth daily.   pravastatin 40 MG tablet Commonly known as:  PRAVACHOL TAKE 1 TABLET BY MOUTH  DAILY   predniSONE 10 MG tablet Commonly known as:  DELTASONE Take 1 tablet (10 mg total) by mouth daily. Label  & dispense according to the schedule below.  6 tablets day one, then 5 table day 2, then 4 tablets day 3, then 3 tablets day 4, 2 tablets day 5, then 1 tablet day 6, then stop   traZODone 50 MG tablet Commonly known as:  DESYREL TAKE 1 TABLET BY MOUTH AT  BEDTIME   TURMERIC PO Take 1 capsule by mouth daily.   vitamin B-12 1000 MCG tablet Commonly known as:  CYANOCOBALAMIN Take 1,000 mcg by mouth daily.   Vitamin D 50 MCG (2000 UT) Caps Take 1 capsule by mouth daily.   VITAMIN E PO Take 180 mg by mouth daily.            Durable Medical Equipment  (From admission, onward)         Start     Ordered   08/03/18 1447  For home use only DME oxygen  Once    Question Answer Comment  Mode or (Route) Nasal cannula   Liters per Minute 2   Frequency Continuous (stationary and portable oxygen unit needed)   Oxygen conserving device No   Oxygen delivery system Gas      08/03/18 1447          Today  Patient seen and evaluated today No shortness of breath No chest pain Comfortable and tolerating diet well Hemodynamically stable Will be discharged home  VITAL SIGNS:  Blood pressure 129/62, pulse 66, temperature 98 F (36.7 C),  temperature source Oral, resp. rate 20, height 5\' 7"  (1.702 m), weight 101.1 kg, SpO2 94 %.  I/O:    Intake/Output Summary (Last 24 hours) at 08/03/2018 1447 Last data filed at 08/03/2018 1100 Gross per 24 hour  Intake 360 ml  Output 600 ml  Net -240 ml    PHYSICAL EXAMINATION:  Physical Exam  GENERAL:  77 y.o.-year-old patient lying in the bed with no acute distress.  LUNGS: Normal breath sounds bilaterally, no wheezing, rales,rhonchi or crepitation. No use of accessory muscles of respiration.  CARDIOVASCULAR: S1, S2 normal. No murmurs, rubs, or gallops.  ABDOMEN: Soft, non-tender, non-distended. Bowel sounds present. No organomegaly or mass.  NEUROLOGIC: Moves all 4 extremities. PSYCHIATRIC: The patient is alert and oriented x 3.  SKIN: No  obvious rash, lesion, or ulcer.   DATA REVIEW:   CBC Recent Labs  Lab 07/30/18 1243  WBC 9.7  HGB 9.3*  HCT 33.4*  PLT 238    Chemistries  Recent Labs  Lab 08/01/18 0429  08/03/18 0317  NA 138   < > 136  K 3.6   < > 4.0  CL 102   < > 100  CO2 27   < > 29  GLUCOSE 169*   < > 179*  BUN 44*   < > 42*  CREATININE 1.19*   < > 1.13*  CALCIUM 9.2   < > 9.1  MG 2.1  --   --    < > = values in this interval not displayed.    Cardiac Enzymes Recent Labs  Lab 07/31/18 0434  TROPONINI <0.03    Microbiology Results  Results for orders placed or performed during the hospital encounter of 07/30/18  MRSA PCR Screening     Status: None   Collection Time: 07/30/18  4:41 PM  Result Value Ref Range Status   MRSA by PCR NEGATIVE NEGATIVE Final    Comment:        The GeneXpert MRSA Assay (FDA approved for NASAL specimens only), is one component of a comprehensive MRSA colonization surveillance program. It is not intended to diagnose MRSA infection nor to guide or monitor treatment for MRSA infections. Performed at [facility], [address], [place], [place] [address]     RADIOLOGY:  No results  found.  Follow up with PCP in 1 week.  Management plans discussed with the patient, family and they are in agreement.  CODE STATUS: Full code    Code Status Orders  (From admission, onward)         Start     Ordered   07/30/18 1654  Full code  Continuous     07/30/18 1653        Code Status History    Date Active Date Inactive Code Status Order ID Comments User Context   07/20/2018 1545 07/22/2018 1808 Full Code [id number]  Henreitta Leber, MD Inpatient    Advance Directive Documentation     Most Recent Value  Type of Advance Directive  Healthcare Power of Attorney, Living will  Pre-existing out of facility DNR order (yellow form or pink MOST form)  -  "MOST" Form in Place?  -      TOTAL TIME TAKING CARE OF THIS PATIENT ON DAY OF DISCHARGE: more than 34 minutes.   Saundra Shelling M.D on 08/03/2018 at 2:47 PM  Between 7am to 6pm - Pager - [phone]  After 6pm go to [email or web address] - password [email or web address] [facility]  Office  [phone]  CC: Primary care physician; Susy Frizzle, MD  Note: This dictation was prepared with Dragon dictation along with smaller phrase technology. Any transcriptional errors that result from this process are unintentional.

## 2018-08-03 NOTE — Progress Notes (Signed)
Pt is being discharged home.  AVS given and explained to pt.  Pt verbalized understanding. Meds and f/u appointments reviewed. Rx given.  Pt qualified for home O2 awaiting O2 tank.

## 2018-08-03 NOTE — Care Management (Addendum)
Qualified for continuous oxygen.  Agreeable and no agency preference.  [facility] can process the referral in the shortest amount of time.  Provided with medicare.gov agency compare information and copy placed in chart. Continues to decline home health nurse

## 2018-08-03 NOTE — Care Management (Signed)
Patient for discharge home today.  her cousin will transport her home.  She declines home health services.  CM requested home oxygen assessment prior to discharge.  Primary nurse will notify CM if home oxygen is needed

## 2018-08-06 DIAGNOSIS — I5033 Acute on chronic diastolic (congestive) heart failure: Secondary | ICD-10-CM | POA: Diagnosis not present

## 2018-08-10 ENCOUNTER — Encounter: Payer: Self-pay | Admitting: Family Medicine

## 2018-08-10 ENCOUNTER — Ambulatory Visit (INDEPENDENT_AMBULATORY_CARE_PROVIDER_SITE_OTHER): Payer: Medicare Other | Admitting: Family Medicine

## 2018-08-10 VITALS — BP 120/80 | HR 70 | Temp 98.3°F | Resp 16 | Ht 65.0 in | Wt 221.0 lb

## 2018-08-10 DIAGNOSIS — R0902 Hypoxemia: Secondary | ICD-10-CM

## 2018-08-10 DIAGNOSIS — R410 Disorientation, unspecified: Secondary | ICD-10-CM | POA: Diagnosis not present

## 2018-08-10 DIAGNOSIS — I5021 Acute systolic (congestive) heart failure: Secondary | ICD-10-CM | POA: Diagnosis not present

## 2018-08-10 DIAGNOSIS — J984 Other disorders of lung: Secondary | ICD-10-CM

## 2018-08-10 DIAGNOSIS — D509 Iron deficiency anemia, unspecified: Secondary | ICD-10-CM

## 2018-08-10 DIAGNOSIS — R0600 Dyspnea, unspecified: Secondary | ICD-10-CM | POA: Diagnosis not present

## 2018-08-10 NOTE — Progress Notes (Signed)
Subjective:    Patient ID: Virginia Walters, female    DOB: [birth year], 77 y.o.   MRN: [id number]  HPI 07/30/18 Patient recently was admitted to the hospital with acute shortness of breath.  She was found to have a hemoglobin of 5.3!.  She received 2 units of blood and her hemoglobin at discharge was 7.8.  EGD was performed and revealed gastric ulcers and erosions that were thought to be the source of the bleeding.  Presents today with her family member due to altered mental status.  Over the weekend, the patient left her home in the afternoon wearing nothing but her night gown and diaper.  She walked to a neighbor's house.  She could not get them at the door and therefore she sat in their car wrapped up in their coats until someone came home.  She was too confused to go back to her own house that she had just walked from.  They called her family member who came and got her.  Today the patient seems very lethargic.  Her reaction time is extremely slow.  I performed a quick Mini-Mental status exam.  She is able to correctly tell me the date.  However she can only remember 2 out of 3 objects on recall.  She cannot perform serial sevens.  On a clock drawing, she draws a circle.  She draws the numbers outside the circle and incorrectly spaced.  She cannot even draw hands on a clock.  She has a difficult time even answering my questions.  She has increased respiratory rate.  Pulse oximetry on room air is 89%.  On pulmonary exam, both lungs are congested with pronounced bibasilar crackles concerning for possible bilateral pneumonia.  At that time, my plan was: Altered mental status, recent hospital admission for an upper GI bleed and profound anemia, worsening confusion, bibasilar crackles, borderline hypoxemia with a room pulse oximetry of 53%   Patient is certainly not at her baseline.  She is very confused.  I am concerned about delirium possibly due to hospital-acquired pneumonia versus a urinary tract  infection.  She is unable to provide a urine sample here at the clinic today.  However given her confusion, the pronounced bibasilar crackles, the borderline hypoxemia, I believe the patient requires emergency room evaluation for possible pneumonia.  I would recommend chest x-ray, urinalysis, and blood work to rule out worsening anemia.  I recommended that she go to the emergency room and will notify the emergency room of her impending arrival.   08/03/18 Was admitted to the hospital after the last visit.  I have copied the discharge summary below for reference: DATE OF ADMISSION:  07/30/2018  ADMITTING PHYSICIAN: Saundra Shelling, MD  DATE OF DISCHARGE: 12/202/2019  PRIMARY CARE PHYSICIAN: Susy Frizzle, MD   ADMISSION DIAGNOSIS:  Acute respiratory failure with hypoxia ([place]) [J96.01] Acute kidney injury superimposed on chronic kidney disease ([place]) [N17.9, N18.9] Acute on chronic congestive heart failure, unspecified heart failure type ([place]) [I50.9] Hypokalemia DISCHARGE DIAGNOSIS:  Acute respiratory failure with hypoxia Acute on chronic systolic heart failure exacerbation Acute COPD exacerbation Hypokalemia Hypertension GERD SECONDARY DIAGNOSIS:       Past Medical History:  Diagnosis Date  . Dyslipidemia   . Frequent falls   . GERD (gastroesophageal reflux disease)   . HTN (hypertension)   . Memory loss   . PFO (patent foramen ovale)   . Retinal artery occlusion   . Weakness      ADMITTING HISTORY [name] G64  y.o.femalewith a known history of hyperlipidemia, chronic systolic heart failure, PFO,retinal artery occlusion GERDpresented to the emergency room for shortness of breath.Patient's oxygen saturation was around 76%.She was put on BiPAP and stabilized.Initially she was given a round of IV Solu-Medrol and nebulization treatments.No history of any COPD on the old reviewed documented chart.Patient appears congested and has pedal  edema.IV Lasix has been ordered and being given in the emergency room.No complaints of chest pain.Oxygen saturation improved after loading on BiPAP.Hospitalist service was consulted for further care.   HOSPITAL COURSE:  Was admitted to stepdown unit for respiratory distress and hypoxia on BiPAP.  Patient was diuresed with IV Lasix and echocardiogram was done and cardiology evaluation.  Potassium was aggressively supplemented during the hospitalization.  Patient was weaned off BiPAP and transition to oxygen by nasal cannula.  Patient continued beta-blocker, ACE inhibitor and Lasix for heart failure along with electrolyte supplementation.  Patient received physical therapy.  She was offered home health services at the time of discharge.  Patient also received IV Solu-Medrol and aggressive nebulization treatments for COPD exacerbation.  She was transitioned to oral steroids.  Was followed by pulmonary attending and cardiology attending during hospitalization.  Repeat echo showed EF of 45%.  MRSA PCR was negative during the work-up in the hospital .Patient will be discharged home follow-up with cardiology in the clinic along with primary care physician.  08/10/18 Patient is here today for follow-up.  She is accompanied by her family member.  She is currently on oxygen.  Now that she is on oxygen, she is mentating closer to her baseline.  Her family member states that she is much better than she was.  However we still do not have a good explanation as to why the patient was so hypoxic to begin with.  Certainly she has systolic failure with an ejection fraction of 45% and diffuse hypokinesis.  However her oxygen was out of proportion to what one would expect with this particular given a chest x-ray without pulmonary edema.  Is on appropriate medications with metoprolol and lisinopril.  Family is not certain if the patient has a follow-up appointment to see the cardiologist for a stress test and to  follow-up her systolic failure.  She was diagnosed with COPD in the hospital.  However I performed pulmonary function test today in my office.  The patient's FEV1 was 1.27 L which is 65% of predicted.  Her FVC was 1.68 L which was 62% of predicted her FEV1 to FVC ratio was 76%.  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  She does have a remote history of smoking however she has not smoked in years and has not been diagnosed with COPD to her knowledge in the past.  She has not seen a pulmonologist prior to this. Past Medical History:  Diagnosis Date  . Dyslipidemia   . Frequent falls   . GERD (gastroesophageal reflux disease)   . HTN (hypertension)   . Memory loss   . PFO (patent foramen ovale)   . Retinal artery occlusion   . Weakness    Past Surgical History:  Procedure Laterality Date  . ANKLE ARTHROSCOPY WITH OPEN REDUCTION INTERNAL FIXATION (ORIF) Right   . APPENDECTOMY    . COLONOSCOPY N/A 07/21/2018   Procedure: COLONOSCOPY;  Surgeon: Lin Landsman, MD;  Location: [facility] ENDOSCOPY;  Service: Gastroenterology;  Laterality: N/A;  . ESOPHAGOGASTRODUODENOSCOPY N/A 07/21/2018   Procedure: ESOPHAGOGASTRODUODENOSCOPY (EGD);  Surgeon: Lin Landsman, MD;  Location:  [facility] ENDOSCOPY;  Service: Gastroenterology;  Laterality: N/A;  . TUBAL LIGATION     Current Outpatient Medications on File Prior to Visit  Medication Sig Dispense Refill  . B Complex-Biotin-FA (B-COMPLEX PO) Take 1 capsule by mouth daily.     . Calcium Carbonate-Vitamin D (CALCIUM + D PO) Take 1 capsule by mouth 2 (two) times daily.     . Cholecalciferol (VITAMIN D) 2000 UNITS CAPS Take 1 capsule by mouth daily.     . dorzolamide (TRUSOPT) 2 % ophthalmic solution Place 1 drop into the left eye 2 (two) times daily.     Marland Kitchen escitalopram (LEXAPRO) 10 MG tablet TAKE 1 TABLET BY MOUTH  DAILY (Patient taking differently: Take 10 mg by mouth daily. ) 90 tablet 1  . fish oil-omega-3 fatty  acids 1000 MG capsule Take 2 g by mouth 2 (two) times daily.      . folic acid (FOLVITE) 1 MG tablet Take 1 mg by mouth daily.    Marland Kitchen latanoprost (XALATAN) 0.005 % ophthalmic solution Place 1 drop into the left eye at bedtime.     Marland Kitchen lisinopril (PRINIVIL,ZESTRIL) 20 MG tablet Take 1 tablet (20 mg total) by mouth daily. 90 tablet 3  . MELATONIN PO Take 5 mg by mouth at bedtime.     . metoprolol tartrate (LOPRESSOR) 25 MG tablet Take 1 tablet (25 mg total) by mouth 2 (two) times daily. 60 tablet 0  . mometasone (ELOCON) 0.1 % ointment Apply topically daily. 45 g 1  . Multiple Vitamin (MULTIVITAMIN) tablet Take 1 tablet by mouth daily.      . OXYGEN Inhale 2 L into the lungs continuous.    . pantoprazole (PROTONIX) 40 MG tablet Take 1 tablet (40 mg total) by mouth 2 (two) times daily. 60 tablet 3  . potassium chloride SA (K-DUR,KLOR-CON) 20 MEQ tablet Take 1 tablet (20 mEq total) by mouth daily. 30 tablet 0  . pravastatin (PRAVACHOL) 40 MG tablet TAKE 1 TABLET BY MOUTH  DAILY (Patient taking differently: Take 40 mg by mouth daily. ) 90 tablet 1  . traZODone (DESYREL) 50 MG tablet TAKE 1 TABLET BY MOUTH AT  BEDTIME (Patient taking differently: Take 50 mg by mouth at bedtime. ) 90 tablet 0  . TURMERIC PO Take 1 capsule by mouth daily.    . vitamin B-12 (CYANOCOBALAMIN) 1000 MCG tablet Take 1,000 mcg by mouth daily.    Marland Kitchen VITAMIN E PO Take 180 mg by mouth daily.     . predniSONE (DELTASONE) 10 MG tablet Take 1 tablet (10 mg total) by mouth daily. Label  & dispense according to the schedule below.  6 tablets day one, then 5 table day 2, then 4 tablets day 3, then 3 tablets day 4, 2 tablets day 5, then 1 tablet day 6, then stop (Patient not taking: Reported on 08/10/2018) 21 tablet 0   No current facility-administered medications on file prior to visit.    Allergies  Allergen Reactions  . Oxycodone Itching   Social History   Socioeconomic History  . Marital status: Widowed    Spouse name: Not on file   . Number of children: 1  . Years of education: HS  . Highest education level: Not on file  Occupational History  . Occupation: Retired  Scientific laboratory technician  . Financial resource strain: Not on file  . Food insecurity:    Worry: Not on file    Inability: Not on file  . Transportation needs:  Medical: Not on file    Non-medical: Not on file  Tobacco Use  . Smoking status: Former Smoker    Packs/day: 1.00    Years: 15.00    Pack years: 15.00    Types: Cigarettes    Last attempt to quit: 12/27/1980    Years since quitting: 37.6  . Smokeless tobacco: Never Used  Substance and Sexual Activity  . Alcohol use: Yes    Comment: occassionally  . Drug use: No  . Sexual activity: Not Currently  Lifestyle  . Physical activity:    Days per week: Not on file    Minutes per session: Not on file  . Stress: Not on file  Relationships  . Social connections:    Talks on phone: Not on file    Gets together: Not on file    Attends religious service: Not on file    Active member of club or organization: Not on file    Attends meetings of clubs or organizations: Not on file    Relationship status: Not on file  . Intimate partner violence:    Fear of current or ex partner: Not on file    Emotionally abused: Not on file    Physically abused: Not on file    Forced sexual activity: Not on file  Other Topics Concern  . Not on file  Social History Narrative   Lives at home alone.   Right-handed.   No daily use of caffeine.     Review of Systems  All other systems reviewed and are negative.      Objective:   Physical Exam Constitutional:      General: She is not in acute distress.    Appearance: She is not ill-appearing, toxic-appearing or diaphoretic.  Neck:     Vascular: No carotid bruit.  Cardiovascular:     Rate and Rhythm: Normal rate and regular rhythm.     Heart sounds: Normal heart sounds. No murmur.  Pulmonary:     Effort: Pulmonary effort is normal.     Breath sounds: No  wheezing, rhonchi or rales.  Musculoskeletal:     Right lower leg: No edema.     Left lower leg: No edema.  Neurological:     General: No focal deficit present.     Mental Status: She is alert. Mental status is at baseline.     Cranial Nerves: Cranial nerves are intact.     Sensory: Sensation is intact.     Coordination: Coordination is intact.     Gait: Gait is intact.           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  However I believe that most recently she was experiencing delirium on top of this secondary to severe hypoxia.  My concern is that we have still not isolated the cause of the hypoxia.  Certainly her anemia could be playing a role.  I will repeat her CBC to ensure that her anemia is improving.  At the hospital on the 16th it was 9.8 which  is improved from 7.8 previously.  It is also possible that her congestive heart failure was playing a role however her ejection fraction was only 45%.  She is on appropriate medication.  I will ensure adequate follow-up with cardiology as the patient may benefit from a stress test given the diffuse hypokinesis seen on the EKG.  However I am concerned that there is an underlying pulmonary issue also contributing to the hypoxia particular given the restrictive pattern seen on her pulmonary function test and the abnormal breath sounds she had previously.  Therefore I will obtain a CT scan of the chest with contrast to evaluate for any evidence of pulmonary fibrosis,  etc.  Also when to rule out a pulmonary embolism given her hypoxia and recent hospitalization.

## 2018-08-11 LAB — CBC WITH DIFFERENTIAL/PLATELET
Absolute Monocytes: 800 cells/uL (ref 200–950)
Basophils Absolute: 52 cells/uL (ref 0–200)
Basophils Relative: 0.6 %
Eosinophils Absolute: 155 cells/uL (ref 15–500)
Eosinophils Relative: 1.8 %
HCT: 37 % (ref 35.0–45.0)
Hemoglobin: 10.4 g/dL — ABNORMAL LOW (ref 11.7–15.5)
Lymphs Abs: 2030 cells/uL (ref 850–3900)
MCH: 20.7 pg — ABNORMAL LOW (ref 27.0–33.0)
MCHC: 28.1 g/dL — ABNORMAL LOW (ref 32.0–36.0)
MCV: 73.7 fL — ABNORMAL LOW (ref 80.0–100.0)
MPV: 9.7 fL (ref 7.5–12.5)
Monocytes Relative: 9.3 %
Neutro Abs: 5564 cells/uL (ref 1500–7800)
Neutrophils Relative %: 64.7 %
Platelets: 328 10*3/uL (ref 140–400)
RBC: 5.02 10*6/uL (ref 3.80–5.10)
RDW: 28.5 % — ABNORMAL HIGH (ref 11.0–15.0)
Total Lymphocyte: 23.6 %
WBC: 8.6 10*3/uL (ref 3.8–10.8)

## 2018-08-11 LAB — COMPLETE METABOLIC PANEL WITH GFR
AG Ratio: 1.1 (calc) (ref 1.0–2.5)
ALT: 81 U/L — ABNORMAL HIGH (ref 6–29)
AST: 31 U/L (ref 10–35)
Albumin: 3.6 g/dL (ref 3.6–5.1)
Alkaline phosphatase (APISO): 71 U/L (ref 33–130)
BUN/Creatinine Ratio: 15 (calc) (ref 6–22)
BUN: 16 mg/dL (ref 7–25)
CO2: 28 mmol/L (ref 20–32)
Calcium: 9.4 mg/dL (ref 8.6–10.4)
Chloride: 105 mmol/L (ref 98–110)
Creat: 1.05 mg/dL — ABNORMAL HIGH (ref 0.60–0.93)
GFR, Est African American: 59 mL/min/{1.73_m2} — ABNORMAL LOW (ref >=60)
GFR, Est Non African American: 51 mL/min/{1.73_m2} — ABNORMAL LOW (ref >=60)
Globulin: 3.3 g/dL (calc) (ref 1.9–3.7)
Glucose, Bld: 96 mg/dL (ref 65–99)
Potassium: 4.3 mmol/L (ref 3.5–5.3)
Sodium: 141 mmol/L (ref 135–146)
Total Bilirubin: 0.3 mg/dL (ref 0.2–1.2)
Total Protein: 6.9 g/dL (ref 6.1–8.1)

## 2018-08-11 LAB — D-DIMER, QUANTITATIVE: D-Dimer, Quant: 1.13 mcg/mL FEU — ABNORMAL HIGH (ref <0.50)

## 2018-08-11 LAB — BRAIN NATRIURETIC PEPTIDE: Brain Natriuretic Peptide: 116 pg/mL — ABNORMAL HIGH (ref <100)

## 2018-08-11 LAB — CBC MORPHOLOGY

## 2018-08-14 ENCOUNTER — Ambulatory Visit
Admission: RE | Admit: 2018-08-14 | Discharge: 2018-08-14 | Disposition: A | Discharge status: Home / Self Care | Payer: Medicare Other | Source: Ambulatory Visit | Attending: Family Medicine | Admitting: Family Medicine

## 2018-08-14 ENCOUNTER — Other Ambulatory Visit: Payer: Self-pay

## 2018-08-14 ENCOUNTER — Other Ambulatory Visit: Payer: Self-pay | Admitting: Family Medicine

## 2018-08-14 DIAGNOSIS — J984 Other disorders of lung: Secondary | ICD-10-CM | POA: Diagnosis not present

## 2018-08-14 DIAGNOSIS — R0902 Hypoxemia: Secondary | ICD-10-CM | POA: Diagnosis not present

## 2018-08-14 DIAGNOSIS — J849 Interstitial pulmonary disease, unspecified: Secondary | ICD-10-CM

## 2018-08-14 DIAGNOSIS — K449 Diaphragmatic hernia without obstruction or gangrene: Secondary | ICD-10-CM | POA: Diagnosis not present

## 2018-08-14 DIAGNOSIS — R7989 Other specified abnormal findings of blood chemistry: Secondary | ICD-10-CM | POA: Diagnosis not present

## 2018-08-14 DIAGNOSIS — I5021 Acute systolic (congestive) heart failure: Secondary | ICD-10-CM

## 2018-08-14 MED ORDER — IOPAMIDOL (ISOVUE-300) INJECTION 61%
75.0000 mL | Freq: Once | INTRAVENOUS | Status: AC | PRN
  Administered 2018-08-14: 75 mL via INTRAVENOUS

## 2018-08-16 ENCOUNTER — Other Ambulatory Visit: Payer: Medicare Other

## 2018-08-20 ENCOUNTER — Ambulatory Visit: Payer: Medicare Other | Admitting: Family

## 2018-08-29 ENCOUNTER — Ambulatory Visit: Payer: Medicare Other | Attending: Family | Admitting: Family

## 2018-08-29 ENCOUNTER — Encounter: Payer: Self-pay | Admitting: Family

## 2018-08-29 VITALS — BP 118/46 | HR 53 | Resp 18 | Ht 67.0 in | Wt 226.0 lb

## 2018-08-29 DIAGNOSIS — E785 Hyperlipidemia, unspecified: Secondary | ICD-10-CM | POA: Insufficient documentation

## 2018-08-29 DIAGNOSIS — Z79899 Other long term (current) drug therapy: Secondary | ICD-10-CM | POA: Diagnosis not present

## 2018-08-29 DIAGNOSIS — Z87891 Personal history of nicotine dependence: Secondary | ICD-10-CM | POA: Insufficient documentation

## 2018-08-29 DIAGNOSIS — Z885 Allergy status to narcotic agent status: Secondary | ICD-10-CM | POA: Diagnosis not present

## 2018-08-29 DIAGNOSIS — I509 Heart failure, unspecified: Secondary | ICD-10-CM | POA: Diagnosis not present

## 2018-08-29 DIAGNOSIS — K219 Gastro-esophageal reflux disease without esophagitis: Secondary | ICD-10-CM | POA: Diagnosis not present

## 2018-08-29 DIAGNOSIS — I110 Hypertensive heart disease with heart failure: Secondary | ICD-10-CM | POA: Diagnosis not present

## 2018-08-29 DIAGNOSIS — I10 Essential (primary) hypertension: Secondary | ICD-10-CM

## 2018-08-29 DIAGNOSIS — I5022 Chronic systolic (congestive) heart failure: Secondary | ICD-10-CM | POA: Insufficient documentation

## 2018-08-29 NOTE — Patient Instructions (Addendum)
Begin weighing daily and call for an overnight weight gain of > 2 pounds or a weekly weight gain of >5 pounds. 

## 2018-08-29 NOTE — Progress Notes (Signed)
Patient ID: Virginia Walters, female    DOB: 1941-03-30, 78 y.o.   MRN: [id number]  HPI  Virginia Walters is a 78 y/o female with a history of HTN, GERD, memory loss, previous tobacco use, occasional alcohol use and chronic heart failure.   Echo report from 07/31/18 reviewed and showed an EF of 45-50%.  Admitted 07/30/18 due to acute on chronic HF. Cardiology consult obtained. Initially needed IV lasix along with bipap. Transitioned to oxygen by nasal cannula along with oral diuretics. Needed IV solumedrol and then given prednisone. Discharged after 4 days. Admitted 07/20/18 due to anemia. GI consult obtained. EGD done which showed gastric ulcers and erosions. Given IV iron along with 2 units of PRBC's. Discharged after 2 days.   She presents today for her initial visit with a chief complaint of moderate fatigue upon minimal exertion. She describes this as chronic in nature having been present for several months. She has no associated symptoms and denies any difficulty sleeping, dizziness, abdominal distention, palpitations, pedal edema, chest pain, shortness of breath or cough. She has not weighing herself but does have scales. Wears oxygen at 2L around the clock. Has no idea what medications she is taking. Has not called to set up cardiology appointment.   Past Medical History:  Diagnosis Date  . CHF (congestive heart failure) ([place])   . Dyslipidemia   . Frequent falls   . GERD (gastroesophageal reflux disease)   . HTN (hypertension)   . Memory loss   . PFO (patent foramen ovale)   . Retinal artery occlusion   . Weakness    Past Surgical History:  Procedure Laterality Date  . ANKLE ARTHROSCOPY WITH OPEN REDUCTION INTERNAL FIXATION (ORIF) Right   . APPENDECTOMY    . COLONOSCOPY N/A 07/21/2018   Procedure: COLONOSCOPY;  Surgeon: Virginia Landsman, MD;  Location: [facility] ENDOSCOPY;  Service: Gastroenterology;  Laterality: N/A;  . ESOPHAGOGASTRODUODENOSCOPY N/A 07/21/2018   Procedure:  ESOPHAGOGASTRODUODENOSCOPY (EGD);  Surgeon: Virginia Landsman, MD;  Location: [facility] ENDOSCOPY;  Service: Gastroenterology;  Laterality: N/A;  . TUBAL LIGATION     Family History  Problem Relation Age of Onset  . Esophageal cancer Father   . Dementia Mother    Social History   Tobacco Use  . Smoking status: Former Smoker    Packs/day: 1.00    Years: 15.00    Pack years: 15.00    Types: Cigarettes    Last attempt to quit: 12/27/1980    Years since quitting: 37.6  . Smokeless tobacco: Never Used  Substance Use Topics  . Alcohol use: Yes    Comment: occassionally   Allergies  Allergen Reactions  . Oxycodone Itching   Prior to Admission medications   Medication Sig Start Date End Date Taking? Authorizing Provider  B Complex-Biotin-FA (B-COMPLEX PO) Take 1 capsule by mouth daily.     [provider]  Calcium Carbonate-Vitamin D (CALCIUM + D PO) Take 1 capsule by mouth 2 (two) times daily.     [provider]  Cholecalciferol (VITAMIN D) 2000 UNITS CAPS Take 1 capsule by mouth daily.     [provider]  dorzolamide (TRUSOPT) 2 % ophthalmic solution Place 1 drop into the left eye 2 (two) times daily.     [provider]  escitalopram (LEXAPRO) 10 MG tablet TAKE 1 TABLET BY MOUTH  DAILY Patient taking differently: Take 10 mg by mouth daily.  05/07/18   Susy Frizzle, MD  fish oil-omega-3 fatty acids 1000 MG  capsule Take 2 g by mouth 2 (two) times daily.      [provider]  folic acid (FOLVITE) 1 MG tablet Take 1 mg by mouth daily.    [provider]  latanoprost (XALATAN) 0.005 % ophthalmic solution Place 1 drop into the left eye at bedtime.  12/05/17   [provider]  lisinopril (PRINIVIL,ZESTRIL) 20 MG tablet Take 1 tablet (20 mg total) by mouth daily. 09/04/17   Susy Frizzle, MD  MELATONIN PO Take 5 mg by mouth at bedtime.     [provider]  metoprolol tartrate (LOPRESSOR) 25 MG tablet Take 1 tablet  (25 mg total) by mouth 2 (two) times daily. 08/03/18 09/02/18  Saundra Shelling, MD  mometasone (ELOCON) 0.1 % ointment Apply topically daily. 02/16/18   Susy Frizzle, MD  Multiple Vitamin (MULTIVITAMIN) tablet Take 1 tablet by mouth daily.      [provider]  OXYGEN Inhale 2 L into the lungs continuous.    [provider]  pantoprazole (PROTONIX) 40 MG tablet Take 1 tablet (40 mg total) by mouth 2 (two) times daily. 07/24/18 08/23/18  Susy Frizzle, MD  potassium chloride SA (K-DUR,KLOR-CON) 20 MEQ tablet Take 1 tablet (20 mEq total) by mouth daily. 08/03/18 09/02/18  Saundra Shelling, MD  pravastatin (PRAVACHOL) 40 MG tablet TAKE 1 TABLET BY MOUTH  DAILY Patient taking differently: Take 40 mg by mouth daily.  05/07/18   Susy Frizzle, MD  predniSONE (DELTASONE) 10 MG tablet Take 1 tablet (10 mg total) by mouth daily. Label  & dispense according to the schedule below.  6 tablets day one, then 5 table day 2, then 4 tablets day 3, then 3 tablets day 4, 2 tablets day 5, then 1 tablet day 6, then stop Patient not taking: Reported on 08/10/2018 08/03/18   Saundra Shelling, MD  traZODone (DESYREL) 50 MG tablet TAKE 1 TABLET BY MOUTH AT  BEDTIME Patient taking differently: Take 50 mg by mouth at bedtime.  05/07/18   Susy Frizzle, MD  TURMERIC PO Take 1 capsule by mouth daily.    [provider]  vitamin B-12 (CYANOCOBALAMIN) 1000 MCG tablet Take 1,000 mcg by mouth daily.    [provider]  VITAMIN E PO Take 180 mg by mouth daily.     [provider]    Review of Systems  Constitutional: Positive for fatigue (tire easy). Negative for appetite change.  HENT: Negative for congestion, postnasal drip and sore throat.   Eyes: Negative.   Respiratory: Negative for cough and shortness of breath.   Cardiovascular: Negative for chest pain, palpitations and leg swelling.  Gastrointestinal: Negative for abdominal distention and abdominal pain.  Endocrine:  Negative.   Genitourinary: Negative.   Musculoskeletal: Negative for arthralgias, back pain and neck pain.  Allergic/Immunologic: Negative.   Neurological: Negative for dizziness and light-headedness.  Hematological: Negative for adenopathy. Does not bruise/bleed easily.  Psychiatric/Behavioral: Negative for dysphoric mood and sleep disturbance (sleeping on 2 pillows with oxygen at 2L). The patient is not nervous/anxious.    Vitals:   08/29/18 1311  BP: (!) 118/46  Pulse: (!) 53  Resp: 18  SpO2: 99%  Weight: 226 lb (102.5 kg)  Height: 5\' 7"  (1.702 m)   Wt Readings from Last 3 Encounters:  08/29/18 226 lb (102.5 kg)  08/10/18 221 lb (100.2 kg)  08/02/18 222 lb 12.8 oz (101.1 kg)   Lab Results  Component Value Date   CREATININE 1.05 (H)  08/10/2018   CREATININE 1.13 (H) 08/03/2018   CREATININE 1.16 (H) 08/02/2018    Physical Exam Vitals signs and nursing note reviewed.  Constitutional:      Appearance: Normal appearance.  HENT:     Head: Normocephalic and atraumatic.  Neck:     Musculoskeletal: Normal range of motion and neck supple.  Cardiovascular:     Rate and Rhythm: Regular rhythm. Bradycardia present.  Pulmonary:     Effort: Pulmonary effort is normal.     Breath sounds: No wheezing or rales.  Abdominal:     General: There is no distension.     Palpations: Abdomen is soft.  Musculoskeletal:        General: No tenderness.     Right lower leg: No edema.     Left lower leg: No edema.  Skin:    General: Skin is warm and dry.  Neurological:     General: No focal deficit present.     Mental Status: She is alert and oriented to person, place, and time.  Psychiatric:        Mood and Affect: Mood normal.        Behavior: Behavior normal.    Assessment & Plan:  1: Chronic heart failure with mildly reduced ejection fraction- - NYHA class III - euvolemic today - not weighing daily but does have scales; instructed to weigh every day and call for an overnight  weight gain of >2 pounds or a weekly weight gain of >5 pounds - not adding salt but does eat salty foods at times; ate french fries at a fast food restaurant today. Reviewed the importance of closely following a 2000mg  sodium diet and written dietary information was given to her about this - EF >40% so would not qualify for entresto - she has no idea what medications she takes and says that there's been "lots of changes". Doesn't use a pillbox and no one helps her with her medications. Says that she gets some from local pharmacy and some from mail order. Concerned about how she is taking her medications and whether it's being taken correctly or not - discussed paramedic program with patient and she is interested so a referral was made  - BNP 07/30/18 was 152.0 - cardiology appointment scheduled with Ignacia Bayley, NP 09/05/2018  2: HTN- - BP looks good today - saw PCP ([facility]) 08/10/18 - BMP from 08/10/18 reviewed and showed sodium 141, potassium 4.3, creatinine 1.05 and GFR 51  Patient did not bring her medications nor a list. Each medication was verbally reviewed with the patient and she was encouraged to bring the bottles to every visit to confirm accuracy of list. Patient keeps saying "I don't know" when asked about the medications.   Return in 6 weeks or sooner for any questions/problems before then.

## 2018-08-30 ENCOUNTER — Other Ambulatory Visit: Payer: Self-pay | Admitting: Family Medicine

## 2018-09-03 ENCOUNTER — Telehealth (HOSPITAL_COMMUNITY): Payer: Self-pay

## 2018-09-03 ENCOUNTER — Other Ambulatory Visit: Payer: Self-pay | Admitting: Family Medicine

## 2018-09-03 NOTE — Telephone Encounter (Signed)
Contacted pt regarding new referral for [facility] program. As I was explaining the purpose and goals of program she asked if this was before or after her stress test this week, I explained that we could schedule appointment to best of her convenience, she quickly said for me to call back next week when its convenient for her and she then hung the phone up.  Will try again next week.   Marylouise Stacks, EMT-Paramedic  09/03/18

## 2018-09-05 ENCOUNTER — Ambulatory Visit: Payer: Medicare Other | Admitting: Nurse Practitioner

## 2018-09-06 DIAGNOSIS — I5033 Acute on chronic diastolic (congestive) heart failure: Secondary | ICD-10-CM | POA: Diagnosis not present

## 2018-09-13 ENCOUNTER — Telehealth (HOSPITAL_COMMUNITY): Payer: Self-pay

## 2018-09-13 ENCOUNTER — Ambulatory Visit (INDEPENDENT_AMBULATORY_CARE_PROVIDER_SITE_OTHER): Payer: Medicare Other | Admitting: Family Medicine

## 2018-09-13 ENCOUNTER — Encounter: Payer: Self-pay | Admitting: Family Medicine

## 2018-09-13 VITALS — BP 128/78 | HR 48 | Temp 97.9°F | Resp 16 | Ht 67.0 in | Wt 226.0 lb

## 2018-09-13 DIAGNOSIS — R0902 Hypoxemia: Secondary | ICD-10-CM | POA: Diagnosis not present

## 2018-09-13 DIAGNOSIS — R413 Other amnesia: Secondary | ICD-10-CM | POA: Diagnosis not present

## 2018-09-13 DIAGNOSIS — I5021 Acute systolic (congestive) heart failure: Secondary | ICD-10-CM | POA: Diagnosis not present

## 2018-09-13 NOTE — Telephone Encounter (Signed)
Called pt again today to sch home visit, pt answered the phone and said she was not interested, she is refusing services at this time and she hung up on me. Will remove her from list, I sent Estill Bamberg, nurse from [place] [facility] to let them know.   Marylouise Stacks, EMT-Paramedic  09/13/18

## 2018-09-13 NOTE — Progress Notes (Signed)
Subjective:    Patient ID: Virginia Walters, female    DOB: Jun 02, 1941, 78 y.o.   MRN: [id number]  HPI 07/30/18 Patient recently was admitted to the hospital with acute shortness of breath.  She was found to have a hemoglobin of 5.3!.  She received 2 units of blood and her hemoglobin at discharge was 7.8.  EGD was performed and revealed gastric ulcers and erosions that were thought to be the source of the bleeding.  Presents today with her family member due to altered mental status.  Over the weekend, the patient left her home in the afternoon wearing nothing but her night gown and diaper.  She walked to a neighbor's house.  She could not get them at the door and therefore she sat in their car wrapped up in their coats until someone came home.  She was too confused to go back to her own house that she had just walked from.  They called her family member who came and got her.  Today the patient seems very lethargic.  Her reaction time is extremely slow.  I performed a quick Mini-Mental status exam.  She is able to correctly tell me the date.  However she can only remember 2 out of 3 objects on recall.  She cannot perform serial sevens.  On a clock drawing, she draws a circle.  She draws the numbers outside the circle and incorrectly spaced.  She cannot even draw hands on a clock.  She has a difficult time even answering my questions.  She has increased respiratory rate.  Pulse oximetry on room air is 89%.  On pulmonary exam, both lungs are congested with pronounced bibasilar crackles concerning for possible bilateral pneumonia.  At that time, my plan was: Altered mental status, recent hospital admission for an upper GI bleed and profound anemia, worsening confusion, bibasilar crackles, borderline hypoxemia with a room pulse oximetry of 34%   Patient is certainly not at her baseline.  She is very confused.  I am concerned about delirium possibly due to hospital-acquired pneumonia versus a urinary tract  infection.  She is unable to provide a urine sample here at the clinic today.  However given her confusion, the pronounced bibasilar crackles, the borderline hypoxemia, I believe the patient requires emergency room evaluation for possible pneumonia.  I would recommend chest x-ray, urinalysis, and blood work to rule out worsening anemia.  I recommended that she go to the emergency room and will notify the emergency room of her impending arrival.   08/03/18 Was admitted to the hospital after the last visit.  I have copied the discharge summary below for reference: DATE OF ADMISSION:  07/30/2018  ADMITTING PHYSICIAN: Saundra Shelling, MD  DATE OF DISCHARGE: 12/202/2019  PRIMARY CARE PHYSICIAN: Susy Frizzle, MD   ADMISSION DIAGNOSIS:  Acute respiratory failure with hypoxia ([place]) [J96.01] Acute kidney injury superimposed on chronic kidney disease ([place]) [N17.9, N18.9] Acute on chronic congestive heart failure, unspecified heart failure type ([place]) [I50.9] Hypokalemia DISCHARGE DIAGNOSIS:  Acute respiratory failure with hypoxia Acute on chronic systolic heart failure exacerbation Acute COPD exacerbation Hypokalemia Hypertension GERD SECONDARY DIAGNOSIS:       Past Medical History:  Diagnosis Date  . Dyslipidemia   . Frequent falls   . GERD (gastroesophageal reflux disease)   . HTN (hypertension)   . Memory loss   . PFO (patent foramen ovale)   . Retinal artery occlusion   . Weakness      ADMITTING HISTORY [name] H89  y.o.femalewith a known history of hyperlipidemia, chronic systolic heart failure, PFO,retinal artery occlusion GERDpresented to the emergency room for shortness of breath.Patient's oxygen saturation was around 76%.She was put on BiPAP and stabilized.Initially she was given a round of IV Solu-Medrol and nebulization treatments.No history of any COPD on the old reviewed documented chart.Patient appears congested and has pedal  edema.IV Lasix has been ordered and being given in the emergency room.No complaints of chest pain.Oxygen saturation improved after loading on BiPAP.Hospitalist service was consulted for further care.   HOSPITAL COURSE:  Was admitted to stepdown unit for respiratory distress and hypoxia on BiPAP.  Patient was diuresed with IV Lasix and echocardiogram was done and cardiology evaluation.  Potassium was aggressively supplemented during the hospitalization.  Patient was weaned off BiPAP and transition to oxygen by nasal cannula.  Patient continued beta-blocker, ACE inhibitor and Lasix for heart failure along with electrolyte supplementation.  Patient received physical therapy.  She was offered home health services at the time of discharge.  Patient also received IV Solu-Medrol and aggressive nebulization treatments for COPD exacerbation.  She was transitioned to oral steroids.  Was followed by pulmonary attending and cardiology attending during hospitalization.  Repeat echo showed EF of 45%.  MRSA PCR was negative during the work-up in the hospital .Patient will be discharged home follow-up with cardiology in the clinic along with primary care physician.  08/10/18 Patient is here today for follow-up.  She is accompanied by her family member.  She is currently on oxygen.  Now that she is on oxygen, she is mentating closer to her baseline.  Her family member states that she is much better than she was.  However we still do not have a good explanation as to why the patient was so hypoxic to begin with.  Certainly she has systolic failure with an ejection fraction of 45% and diffuse hypokinesis.  However her oxygen was out of proportion to what one would expect with this particular given a chest x-ray without pulmonary edema.  Is on appropriate medications with metoprolol and lisinopril.  Family is not certain if the patient has a follow-up appointment to see the cardiologist for a stress test and to  follow-up her systolic failure.  She was diagnosed with COPD in the hospital.  However I performed pulmonary function test today in my office.  The patient's FEV1 was 1.27 L which is 65% of predicted.  Her FVC was 1.68 L which was 62% of predicted her FEV1 to FVC ratio was 76%.  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  She does have a remote history of smoking however she has not smoked in years and has not been diagnosed with COPD to her knowledge in the past.  She has not seen a pulmonologist prior to this.  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  However I believe that most recently she was experiencing delirium on top of this secondary to severe hypoxia.  My concern is that we have still not isolated the cause of the hypoxia.  Certainly her anemia could be playing a role.  I will repeat her CBC to ensure that her anemia is improving.  At the hospital on the 16th it was 9.8 which is improved from 7.8 previously.  It is also possible that her congestive heart failure was playing a role however her ejection fraction was only  45%.  She is on appropriate medication.  I will ensure adequate follow-up with cardiology as the patient may benefit from a stress test given the diffuse hypokinesis seen on the EKG.  However I am concerned that there is an underlying pulmonary issue also contributing to the hypoxia particular given the restrictive pattern seen on her pulmonary function test and the abnormal breath sounds she had previously.  Therefore I will obtain a CT scan of the chest with contrast to evaluate for any evidence of pulmonary fibrosis, etc.  Also when to rule out a pulmonary embolism given her hypoxia and recent hospitalization.  09/13/18 CT revealed: IMPRESSION: 1. No specific findings identified to suggest interstitial lung disease. 2. Diminished lung  volumes with subsegmental atelectasis and dependent changes in the posterior lung bases. 3. Increase caliber of the main pulmonary artery which may be secondary to PA hypertension. 4. Cardiac enlargement, aortic atherosclerosis, and multi vessel coronary artery atherosclerotic calcifications. Aortic Atherosclerosis (ICD10-I70.0). 5. Scattered small solid pulmonary nodules measuring less than 5 mm. No follow-up needed if patient is low-risk (and has no known or suspected primary neoplasm). Non-contrast chest CT can be considered in 12 months if patient is high-risk. This recommendation follows the consensus statement: Guidelines for Management of Incidental Pulmonary Nodules Detected on CT Images: From the Fleischner Society 2017; Radiology 2017; 284:228-243. 6. Part solid nodule identified within the left upper lobe. Follow-up non-contrast CT recommended at 3-6 months to confirm persistence. If unchanged, and solid component remains <6 mm, annual CT is recommended until 5 years of stability has been established. If persistent these nodules should be considered highly suspicious if the solid component of the nodule is 6 mm or greater in size and enlarging. This recommendation follows the consensus statement: Guidelines for Management of Incidental Pulmonary Nodules Detected on CT Images:From the Fleischner Society 2017; published online before print (10.1148/[id number]).  09/13/18 Patient did not see the cardiologist.  They canceled that appointment.  She is still on oxygen.  Her family member state that when she is wearing her oxygen, her mentation is better, her ability to ambulate is better.  Her functional status is better.  However she frequently takes her oxygen off at night when she is sleeping by accident and therefore in the mornings, she can sometimes be confused and short of breath.  I took the patient's oxygen off.  She is 93% resting on room air.  With ambulation, she  drops to 88% and only 60 feet.  At rest, her lungs are clear to auscultation bilaterally however after walking 60 feet, she began to audibly wheeze and demonstrate rhonchorous breath sounds suggesting obstructive lung disease.  She states that she does have a history of asthma in the past.  I also performed a Mini-Mental status exam today.  The patient is able to remember 3 out of 3 objects on recall.  She can spell world in reverse.  She has a difficult time performing serial sevens however.  Patient's family is concerned that she may be drinking as well at home.  She has a past history of alcohol use. Past Medical History:  Diagnosis Date  . CHF (congestive heart failure) ([place])   . Dyslipidemia   . Frequent falls   . GERD (gastroesophageal reflux disease)   . HTN (hypertension)   . Memory loss   . PFO (patent foramen ovale)   . Retinal artery occlusion   . Weakness    Past Surgical History:  Procedure Laterality Date  . ANKLE  ARTHROSCOPY WITH OPEN REDUCTION INTERNAL FIXATION (ORIF) Right   . APPENDECTOMY    . COLONOSCOPY N/A 07/21/2018   Procedure: COLONOSCOPY;  Surgeon: Lin Landsman, MD;  Location: [facility] ENDOSCOPY;  Service: Gastroenterology;  Laterality: N/A;  . ESOPHAGOGASTRODUODENOSCOPY N/A 07/21/2018   Procedure: ESOPHAGOGASTRODUODENOSCOPY (EGD);  Surgeon: Lin Landsman, MD;  Location[facility] ENDOSCOPY;  Service: Gastroenterology;  Laterality: N/A;  . TUBAL LIGATION     Current Outpatient Medications on File Prior to Visit  Medication Sig Dispense Refill  . B Complex-Biotin-FA (B-COMPLEX PO) Take 1 capsule by mouth daily.     . Calcium Carbonate-Vitamin D (CALCIUM + D PO) Take 1 capsule by mouth 2 (two) times daily.     . Cholecalciferol (VITAMIN D) 2000 UNITS CAPS Take 1 capsule by mouth daily.     . dorzolamide (TRUSOPT) 2 % ophthalmic solution Place 1 drop into the left eye 2 (two) times daily.     Marland Kitchen escitalopram (LEXAPRO) 10 MG tablet TAKE 1 TABLET BY MOUTH  DAILY  (Patient taking differently: Take 10 mg by mouth daily. ) 90 tablet 1  . fish oil-omega-3 fatty acids 1000 MG capsule Take 2 g by mouth 2 (two) times daily.      . folic acid (FOLVITE) 1 MG tablet Take 1 mg by mouth daily.    Marland Kitchen latanoprost (XALATAN) 0.005 % ophthalmic solution Place 1 drop into the left eye at bedtime.     Marland Kitchen lisinopril (PRINIVIL,ZESTRIL) 20 MG tablet Take 1 tablet (20 mg total) by mouth daily. 90 tablet 3  . MELATONIN PO Take 5 mg by mouth at bedtime.     . metoprolol tartrate (LOPRESSOR) 25 MG tablet TAKE 1 TABLET BY MOUTH TWICE A DAY 60 tablet 3  . mometasone (ELOCON) 0.1 % ointment Apply topically daily. 45 g 1  . Multiple Vitamin (MULTIVITAMIN) tablet Take 1 tablet by mouth daily.      . OXYGEN Inhale 2 L into the lungs continuous.    . pantoprazole (PROTONIX) 40 MG tablet Take 1 tablet (40 mg total) by mouth 2 (two) times daily. 60 tablet 3  . potassium chloride SA (K-DUR,KLOR-CON) 20 MEQ tablet TAKE 1 TABLET BY MOUTH ONCE A DAY 30 tablet 3  . pravastatin (PRAVACHOL) 40 MG tablet TAKE 1 TABLET BY MOUTH  DAILY (Patient taking differently: Take 40 mg by mouth daily. ) 90 tablet 1  . predniSONE (DELTASONE) 10 MG tablet Take 1 tablet (10 mg total) by mouth daily. Label  & dispense according to the schedule below.  6 tablets day one, then 5 table day 2, then 4 tablets day 3, then 3 tablets day 4, 2 tablets day 5, then 1 tablet day 6, then stop (Patient not taking: Reported on 08/10/2018) 21 tablet 0  . traZODone (DESYREL) 50 MG tablet TAKE 1 TABLET BY MOUTH AT  BEDTIME 90 tablet 0  . TURMERIC PO Take 1 capsule by mouth daily.    . vitamin B-12 (CYANOCOBALAMIN) 1000 MCG tablet Take 1,000 mcg by mouth daily.    Marland Kitchen VITAMIN E PO Take 180 mg by mouth daily.      No current facility-administered medications on file prior to visit.    Allergies  Allergen Reactions  . Oxycodone Itching   Social History   Socioeconomic History  . Marital status: Widowed    Spouse name: Not on file   . Number of children: 1  . Years of education: HS  . Highest education level: Not on file  Occupational  History  . Occupation: Retired  Scientific laboratory technician  . Financial resource strain: Not on file  . Food insecurity:    Worry: Not on file    Inability: Not on file  . Transportation needs:    Medical: Not on file    Non-medical: Not on file  Tobacco Use  . Smoking status: Former Smoker    Packs/day: 1.00    Years: 15.00    Pack years: 15.00    Types: Cigarettes    Last attempt to quit: 12/27/1980    Years since quitting: 37.7  . Smokeless tobacco: Never Used  Substance and Sexual Activity  . Alcohol use: Yes    Comment: occassionally  . Drug use: No  . Sexual activity: Not Currently  Lifestyle  . Physical activity:    Days per week: Not on file    Minutes per session: Not on file  . Stress: Not on file  Relationships  . Social connections:    Talks on phone: Not on file    Gets together: Not on file    Attends religious service: Not on file    Active member of club or organization: Not on file    Attends meetings of clubs or organizations: Not on file    Relationship status: Not on file  . Intimate partner violence:    Fear of current or ex partner: Not on file    Emotionally abused: Not on file    Physically abused: Not on file    Forced sexual activity: Not on file  Other Topics Concern  . Not on file  Social History Narrative   Lives at home alone.   Right-handed.   No daily use of caffeine.     Review of Systems  All other systems reviewed and are negative.      Objective:   Physical Exam Constitutional:      General: She is not in acute distress.    Appearance: She is not ill-appearing, toxic-appearing or diaphoretic.  Neck:     Vascular: No carotid bruit.  Cardiovascular:     Rate and Rhythm: Normal rate and regular rhythm.     Heart sounds: Normal heart sounds. No murmur.  Pulmonary:     Effort: Pulmonary effort is normal.     Breath sounds: No  wheezing, rhonchi or rales.  Musculoskeletal:     Right lower leg: No edema.     Left lower leg: No edema.  Neurological:     General: No focal deficit present.     Mental Status: She is alert. Mental status is at baseline.     Cranial Nerves: Cranial nerves are intact.     Sensory: Sensation is intact.     Coordination: Coordination is intact.     Gait: Gait is intact.           Assessment & Plan:   Hypoxia  CHF (congestive heart failure), NYHA class III, acute, systolic (HCC)  Memory loss  CT of the lungs did not reveal any specific cause of her hypoxia.  Pulmonary function test have been unrevealing however her exam today suggest reversible obstruction.  Therefore I am going to empirically try the patient on Symbicort 160/4.5, 2 puffs inhaled twice daily and reassess the patient in 2 to 3 weeks to see if her breathing has improved.  I encouraged the patient to follow-up as planned with cardiology.  Given her hypoxia, she may benefit from a stress test to rule out underlying ischemia as  a potential cause of her shortness of breath however I suspect COPD is more likely the cause.  I also believe the patient has mild underlying dementia.  I have recommended having someone stay with her at night to avoid unnecessary falls and also because I am worried about sundowning potentially causing her to take off her oxygen.  We discussed potentially moving to an assisted living facility and the patient is completely resistant to this.

## 2018-09-28 ENCOUNTER — Encounter: Payer: Self-pay | Admitting: Family Medicine

## 2018-09-28 ENCOUNTER — Ambulatory Visit (INDEPENDENT_AMBULATORY_CARE_PROVIDER_SITE_OTHER): Payer: Medicare Other | Admitting: Family Medicine

## 2018-09-28 VITALS — BP 124/74 | HR 48 | Temp 98.3°F | Resp 20 | Ht 65.0 in | Wt 233.0 lb

## 2018-09-28 DIAGNOSIS — I5022 Chronic systolic (congestive) heart failure: Secondary | ICD-10-CM

## 2018-09-28 DIAGNOSIS — R0902 Hypoxemia: Secondary | ICD-10-CM | POA: Diagnosis not present

## 2018-09-28 NOTE — Progress Notes (Signed)
Subjective:    Patient ID: Virginia Walters, female    DOB: 07-17-1941, 78 y.o.   MRN: [id number]  HPI  07/30/18 Patient recently was admitted to the hospital with acute shortness of breath.  She was found to have a hemoglobin of 5.3!.  She received 2 units of blood and her hemoglobin at discharge was 7.8.  EGD was performed and revealed gastric ulcers and erosions that were thought to be the source of the bleeding.  Presents today with her family member due to altered mental status.  Over the weekend, the patient left her home in the afternoon wearing nothing but her night gown and diaper.  She walked to a neighbor's house.  She could not get them at the door and therefore she sat in their car wrapped up in their coats until someone came home.  She was too confused to go back to her own house that she had just walked from.  They called her family member who came and got her.  Today the patient seems very lethargic.  Her reaction time is extremely slow.  I performed a quick Mini-Mental status exam.  She is able to correctly tell me the date.  However she can only remember 2 out of 3 objects on recall.  She cannot perform serial sevens.  On a clock drawing, she draws a circle.  She draws the numbers outside the circle and incorrectly spaced.  She cannot even draw hands on a clock.  She has a difficult time even answering my questions.  She has increased respiratory rate.  Pulse oximetry on room air is 89%.  On pulmonary exam, both lungs are congested with pronounced bibasilar crackles concerning for possible bilateral pneumonia.  At that time, my plan was: Altered mental status, recent hospital admission for an upper GI bleed and profound anemia, worsening confusion, bibasilar crackles, borderline hypoxemia with a room pulse oximetry of 98%   Patient is certainly not at her baseline.  She is very confused.  I am concerned about delirium possibly due to hospital-acquired pneumonia versus a urinary tract  infection.  She is unable to provide a urine sample here at the clinic today.  However given her confusion, the pronounced bibasilar crackles, the borderline hypoxemia, I believe the patient requires emergency room evaluation for possible pneumonia.  I would recommend chest x-ray, urinalysis, and blood work to rule out worsening anemia.  I recommended that she go to the emergency room and will notify the emergency room of her impending arrival.   08/03/18 Was admitted to the hospital after the last visit.  I have copied the discharge summary below for reference: DATE OF ADMISSION:  07/30/2018  ADMITTING PHYSICIAN: Saundra Shelling, MD  DATE OF DISCHARGE: 12/202/2019  PRIMARY CARE PHYSICIAN: Susy Frizzle, MD   ADMISSION DIAGNOSIS:  Acute respiratory failure with hypoxia ([place]) [J96.01] Acute kidney injury superimposed on chronic kidney disease ([place]) [N17.9, N18.9] Acute on chronic congestive heart failure, unspecified heart failure type ([place]) [I50.9] Hypokalemia DISCHARGE DIAGNOSIS:  Acute respiratory failure with hypoxia Acute on chronic systolic heart failure exacerbation Acute COPD exacerbation Hypokalemia Hypertension GERD SECONDARY DIAGNOSIS:       Past Medical History:  Diagnosis Date  . Dyslipidemia   . Frequent falls   . GERD (gastroesophageal reflux disease)   . HTN (hypertension)   . Memory loss   . PFO (patent foramen ovale)   . Retinal artery occlusion   . Weakness      ADMITTING HISTORY Virginia Walters  a58 y.o.femalewith a known history of hyperlipidemia, chronic systolic heart failure, PFO,retinal artery occlusion GERDpresented to the emergency room for shortness of breath.Patient's oxygen saturation was around 76%.She was put on BiPAP and stabilized.Initially she was given a round of IV Solu-Medrol and nebulization treatments.No history of any COPD on the old reviewed documented chart.Patient appears congested and has pedal  edema.IV Lasix has been ordered and being given in the emergency room.No complaints of chest pain.Oxygen saturation improved after loading on BiPAP.Hospitalist service was consulted for further care.   HOSPITAL COURSE:  Was admitted to stepdown unit for respiratory distress and hypoxia on BiPAP.  Patient was diuresed with IV Lasix and echocardiogram was done and cardiology evaluation.  Potassium was aggressively supplemented during the hospitalization.  Patient was weaned off BiPAP and transition to oxygen by nasal cannula.  Patient continued beta-blocker, ACE inhibitor and Lasix for heart failure along with electrolyte supplementation.  Patient received physical therapy.  She was offered home health services at the time of discharge.  Patient also received IV Solu-Medrol and aggressive nebulization treatments for COPD exacerbation.  She was transitioned to oral steroids.  Was followed by pulmonary attending and cardiology attending during hospitalization.  Repeat echo showed EF of 45%.  MRSA PCR was negative during the work-up in the hospital .Patient will be discharged home follow-up with cardiology in the clinic along with primary care physician.  08/10/18 Patient is here today for follow-up.  She is accompanied by her family member.  She is currently on oxygen.  Now that she is on oxygen, she is mentating closer to her baseline.  Her family member states that she is much better than she was.  However we still do not have a good explanation as to why the patient was so hypoxic to begin with.  Certainly she has systolic failure with an ejection fraction of 45% and diffuse hypokinesis.  However her oxygen was out of proportion to what one would expect with this particular given a chest x-ray without pulmonary edema.  Is on appropriate medications with metoprolol and lisinopril.  Family is not certain if the patient has a follow-up appointment to see the cardiologist for a stress test and to  follow-up her systolic failure.  She was diagnosed with COPD in the hospital.  However I performed pulmonary function test today in my office.  The patient's FEV1 was 1.27 L which is 65% of predicted.  Her FVC was 1.68 L which was 62% of predicted her FEV1 to FVC ratio was 76%.  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  She does have a remote history of smoking however she has not smoked in years and has not been diagnosed with COPD to her knowledge in the past.  She has not seen a pulmonologist prior to this.  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  However I believe that most recently she was experiencing delirium on top of this secondary to severe hypoxia.  My concern is that we have still not isolated the cause of the hypoxia.  Certainly her anemia could be playing a role.  I will repeat her CBC to ensure that her anemia is improving.  At the hospital on the 16th it was 9.8 which is improved from 7.8 previously.  It is also possible that her congestive heart failure was playing a role however her ejection fraction was  only 45%.  She is on appropriate medication.  I will ensure adequate follow-up with cardiology as the patient may benefit from a stress test given the diffuse hypokinesis seen on the EKG.  However I am concerned that there is an underlying pulmonary issue also contributing to the hypoxia particular given the restrictive pattern seen on her pulmonary function test and the abnormal breath sounds she had previously.  Therefore I will obtain a CT scan of the chest with contrast to evaluate for any evidence of pulmonary fibrosis, etc.  Also when to rule out a pulmonary embolism given her hypoxia and recent hospitalization.  09/13/18 CT revealed: IMPRESSION: 1. No specific findings identified to suggest interstitial lung disease. 2. Diminished lung  volumes with subsegmental atelectasis and dependent changes in the posterior lung bases. 3. Increase caliber of the main pulmonary artery which may be secondary to PA hypertension. 4. Cardiac enlargement, aortic atherosclerosis, and multi vessel coronary artery atherosclerotic calcifications. Aortic Atherosclerosis (ICD10-I70.0). 5. Scattered small solid pulmonary nodules measuring less than 5 mm. No follow-up needed if patient is low-risk (and has no known or suspected primary neoplasm). Non-contrast chest CT can be considered in 12 months if patient is high-risk. This recommendation follows the consensus statement: Guidelines for Management of Incidental Pulmonary Nodules Detected on CT Images: From the Fleischner Society 2017; Radiology 2017; 284:228-243. 6. Part solid nodule identified within the left upper lobe. Follow-up non-contrast CT recommended at 3-6 months to confirm persistence. If unchanged, and solid component remains <6 mm, annual CT is recommended until 5 years of stability has been established. If persistent these nodules should be considered highly suspicious if the solid component of the nodule is 6 mm or greater in size and enlarging. This recommendation follows the consensus statement: Guidelines for Management of Incidental Pulmonary Nodules Detected on CT Images:From the Fleischner Society 2017; published online before print (10.1148/[id number]).  09/13/18 Patient did not see the cardiologist.  They canceled that appointment.  She is still on oxygen.  Her family member state that when she is wearing her oxygen, her mentation is better, her ability to ambulate is better.  Her functional status is better.  However she frequently takes her oxygen off at night when she is sleeping by accident and therefore in the mornings, she can sometimes be confused and short of breath.  I took the patient's oxygen off.  She is 93% resting on room air.  With ambulation, she  drops to 88% and only 60 feet.  At rest, her lungs are clear to auscultation bilaterally however after walking 60 feet, she began to audibly wheeze and demonstrate rhonchorous breath sounds suggesting obstructive lung disease.  She states that she does have a history of asthma in the past.  I also performed a Mini-Mental status exam today.  The patient is able to remember 3 out of 3 objects on recall.  She can spell world in reverse.  She has a difficult time performing serial sevens however.  Patient's family is concerned that she may be drinking as well at home.  She has a past history of alcohol use.  At that time, my plan was: CT of the lungs did not reveal any specific cause of her hypoxia.  Pulmonary function test have been unrevealing however her exam today suggest reversible obstruction.  Therefore I am going to empirically try the patient on Symbicort 160/4.5, 2 puffs inhaled twice daily and reassess the patient in 2 to 3 weeks to see if her breathing has improved.  I encouraged the patient to follow-up as planned with cardiology.  Given her hypoxia, she may benefit from a stress test to rule out underlying ischemia as a potential cause of her shortness of breath however I suspect COPD is more likely the cause.  I also believe the patient has mild underlying dementia.  I have recommended having someone stay with her at night to avoid unnecessary falls and also because I am worried about sundowning potentially causing her to take off her oxygen.  We discussed potentially moving to an assisted living facility and the patient is completely resistant to this.  09/28/18 Patient denies seeing any substantial benefit since starting Symbicort.  They have also not contacted the cardiologist as far as aging outpatient cardiology evaluation given her recent admission with pulmonary edema and her ejection fraction of 45% with diffuse hypokinesis.  However I took the patient's oxygen off today.  She maintain oxygen  saturations of 94% sitting.  We ambulated around the office and the patient maintained an oxygen saturation between 91 and 94% and even after 100 feet she was still 94% despite not taking or wearing her oxygen.  Objectively this seems to show an improvement in her respiratory function which I would attribute to the Symbicort.  Her lungs are much clear to auscultation today.  There are no crackles or wheezes or rails.  Her family states that they feel like she is doing better since starting the inhaler. Past Medical History:  Diagnosis Date  . CHF (congestive heart failure) ([place])   . Dyslipidemia   . Frequent falls   . GERD (gastroesophageal reflux disease)   . HTN (hypertension)   . Memory loss   . PFO (patent foramen ovale)   . Retinal artery occlusion   . Weakness    Past Surgical History:  Procedure Laterality Date  . ANKLE ARTHROSCOPY WITH OPEN REDUCTION INTERNAL FIXATION (ORIF) Right   . APPENDECTOMY    . COLONOSCOPY N/A 07/21/2018   Procedure: COLONOSCOPY;  Surgeon: Lin Landsman, MD;  [facility] END[facility]COPY;  Service: Gastroenterology;  Laterality: N/A;  . ESOPHAGOGASTRODUODENOSCOPY N/A 07/21/2018   Procedure: ESOPHAGOGASTRODUODENOSCOPY (EGD);  Surgeon: Lin Landsman, MD;  Location: [facility] ENDOSCOPY;  Service: Gastroenterology;  Laterality: N/A;  . TUBAL LIGATION     Current Outpatient Medications on File Prior to Visit  Medication Sig Dispense Refill  . B Complex-Biotin-FA (B-COMPLEX PO) Take 1 capsule by mouth daily.     . budesonide-formoterol (SYMBICORT) 160-4.5 MCG/ACT inhaler Inhale 2 puffs into the lungs 2 (two) times daily.    . Calcium Carbonate-Vitamin D (CALCIUM + D PO) Take 1 capsule by mouth 2 (two) times daily.     . Cholecalciferol (VITAMIN D) 2000 UNITS CAPS Take 1 capsule by mouth daily.     . dorzolamide (TRUSOPT) 2 % ophthalmic solution Place 1 drop into the left eye 2 (two) times daily.     Marland Kitchen escitalopram (LEXAPRO) 10 MG tablet TAKE 1 TABLET BY MOUTH   DAILY (Patient taking differently: Take 10 mg by mouth daily. ) 90 tablet 1  . fish oil-omega-3 fatty acids 1000 MG capsule Take 2 g by mouth 2 (two) times daily.      . folic acid (FOLVITE) 1 MG tablet Take 1 mg by mouth daily.    Marland Kitchen latanoprost (XALATAN) 0.005 % ophthalmic solution Place 1 drop into the left eye at bedtime.     Marland Kitchen lisinopril (PRINIVIL,ZESTRIL) 20 MG tablet Take 1 tablet (20 mg total) by mouth daily.  90 tablet 3  . MELATONIN PO Take 5 mg by mouth at bedtime.     . metoprolol tartrate (LOPRESSOR) 25 MG tablet TAKE 1 TABLET BY MOUTH TWICE A DAY 60 tablet 3  . mometasone (ELOCON) 0.1 % ointment Apply topically daily. 45 g 1  . Multiple Vitamin (MULTIVITAMIN) tablet Take 1 tablet by mouth daily.      . OXYGEN Inhale 2 L into the lungs continuous.    . potassium chloride SA (K-DUR,KLOR-CON) 20 MEQ tablet TAKE 1 TABLET BY MOUTH ONCE A DAY 30 tablet 3  . pravastatin (PRAVACHOL) 40 MG tablet TAKE 1 TABLET BY MOUTH  DAILY (Patient taking differently: TAKE 1 TABLET BY MOUTH  DAILY) 90 tablet 1  . traZODone (DESYREL) 50 MG tablet TAKE 1 TABLET BY MOUTH AT  BEDTIME 90 tablet 0  . TURMERIC PO Take 1 capsule by mouth daily.    . vitamin B-12 (CYANOCOBALAMIN) 1000 MCG tablet Take 1,000 mcg by mouth daily.    Marland Kitchen VITAMIN E PO Take 180 mg by mouth daily.     . pantoprazole (PROTONIX) 40 MG tablet Take 1 tablet (40 mg total) by mouth 2 (two) times daily. 60 tablet 3   No current facility-administered medications on file prior to visit.    Allergies  Allergen Reactions  . Oxycodone Itching   Social History   Socioeconomic History  . Marital status: Widowed    Spouse name: Not on file  . Number of children: 1  . Years of education: HS  . Highest education level: Not on file  Occupational History  . Occupation: Retired  Scientific laboratory technician  . Financial resource strain: Not on file  . Food insecurity:    Worry: Not on file    Inability: Not on file  . Transportation needs:    Medical: Not  on file    Non-medical: Not on file  Tobacco Use  . Smoking status: Former Smoker    Packs/day: 1.00    Years: 15.00    Pack years: 15.00    Types: Cigarettes    Last attempt to quit: 12/27/1980    Years since quitting: 37.7  . Smokeless tobacco: Never Used  Substance and Sexual Activity  . Alcohol use: Yes    Comment: occassionally  . Drug use: No  . Sexual activity: Not Currently  Lifestyle  . Physical activity:    Days per week: Not on file    Minutes per session: Not on file  . Stress: Not on file  Relationships  . Social connections:    Talks on phone: Not on file    Gets together: Not on file    Attends religious service: Not on file    Active member of club or organization: Not on file    Attends meetings of clubs or organizations: Not on file    Relationship status: Not on file  . Intimate partner violence:    Fear of current or ex partner: Not on file    Emotionally abused: Not on file    Physically abused: Not on file    Forced sexual activity: Not on file  Other Topics Concern  . Not on file  Social History Narrative   Lives at home alone.   Right-handed.   No daily use of caffeine.     Review of Systems  All other systems reviewed and are negative.      Objective:   Physical Exam Constitutional:      General: She is  not in acute distress.    Appearance: She is not ill-appearing, toxic-appearing or diaphoretic.  Neck:     Vascular: No carotid bruit.  Cardiovascular:     Rate and Rhythm: Normal rate and regular rhythm.     Heart sounds: Normal heart sounds. No murmur.  Pulmonary:     Effort: Pulmonary effort is normal.     Breath sounds: No wheezing, rhonchi or rales.  Musculoskeletal:     Right lower leg: No edema.     Left lower leg: No edema.  Neurological:     General: No focal deficit present.     Mental Status: She is alert. Mental status is at baseline.     Cranial Nerves: Cranial nerves are intact.     Sensory: Sensation is intact.       Coordination: Coordination is intact.     Gait: Gait is intact.           Assessment & Plan:   Chronic systolic congestive heart failure, NYHA class 2 ([place]) - Plan: Ambulatory referral to Cardiology  Hypoxia  I am still not certain as to the cause of her hypoxia.  Patient did have pulmonary edema however her echocardiogram was not impressive.  I did recommend outpatient cardiology evaluation to determine if the patient requires a stress test to evaluate for any evidence of coronary artery disease attributing to her cardiomyopathy.  However objectively, the patient seemed to improve on Symbicort and is no longer hypoxic with ambulation.  Therefore I will discontinue the Symbicort and try to maximize therapy by switching the patient to Stiolto 2 inhalations a day.  I will recheck the patient in 2 weeks to see how she is doing.  Hopefully at that time we can discontinue her oxygen.  Despite results of her pulmonary function test, objectively she seems to be responding to bronchodilator therapy suggesting perhaps a component of COPD playing a role in her hypoxia

## 2018-10-07 DIAGNOSIS — I5033 Acute on chronic diastolic (congestive) heart failure: Secondary | ICD-10-CM | POA: Diagnosis not present

## 2018-10-08 ENCOUNTER — Encounter: Payer: Self-pay | Admitting: Family

## 2018-10-08 ENCOUNTER — Encounter: Payer: Self-pay | Admitting: Pharmacist

## 2018-10-08 ENCOUNTER — Ambulatory Visit: Payer: Medicare Other | Attending: Family | Admitting: Family

## 2018-10-08 VITALS — BP 150/66 | HR 66 | Resp 18 | Ht 67.0 in | Wt 229.2 lb

## 2018-10-08 DIAGNOSIS — J441 Chronic obstructive pulmonary disease with (acute) exacerbation: Secondary | ICD-10-CM | POA: Insufficient documentation

## 2018-10-08 DIAGNOSIS — I509 Heart failure, unspecified: Secondary | ICD-10-CM | POA: Insufficient documentation

## 2018-10-08 DIAGNOSIS — E785 Hyperlipidemia, unspecified: Secondary | ICD-10-CM | POA: Insufficient documentation

## 2018-10-08 DIAGNOSIS — I110 Hypertensive heart disease with heart failure: Secondary | ICD-10-CM | POA: Insufficient documentation

## 2018-10-08 DIAGNOSIS — Z885 Allergy status to narcotic agent status: Secondary | ICD-10-CM | POA: Insufficient documentation

## 2018-10-08 DIAGNOSIS — Z87891 Personal history of nicotine dependence: Secondary | ICD-10-CM | POA: Diagnosis not present

## 2018-10-08 DIAGNOSIS — K219 Gastro-esophageal reflux disease without esophagitis: Secondary | ICD-10-CM | POA: Diagnosis not present

## 2018-10-08 DIAGNOSIS — I5022 Chronic systolic (congestive) heart failure: Secondary | ICD-10-CM

## 2018-10-08 DIAGNOSIS — Z9981 Dependence on supplemental oxygen: Secondary | ICD-10-CM | POA: Diagnosis not present

## 2018-10-08 DIAGNOSIS — Z79899 Other long term (current) drug therapy: Secondary | ICD-10-CM | POA: Insufficient documentation

## 2018-10-08 DIAGNOSIS — I10 Essential (primary) hypertension: Secondary | ICD-10-CM

## 2018-10-08 DIAGNOSIS — R413 Other amnesia: Secondary | ICD-10-CM | POA: Diagnosis not present

## 2018-10-08 NOTE — Progress Notes (Signed)
Cardiology Office Note  Date:  10/09/2018   ID:  Virginia Walters, DOB [birth year], MRN [id number]  PCP:  Susy Frizzle, MD   Chief Complaint  Patient presents with  . OTHER    CHF. Meds reviewed verbally with pt.    HPI:  78 y.o. female woman with history of  HFpEF (LVEF 45-50% by echo earlier this month),  HTN,  HLD, CVA,  Past smoker frequent falls with orthostatic hypotension, PFO,  anemia with prior upper GI bleed,   INTERVAL HISTORY: The patient reports today for follow up after being admitted 07/2018 with acute hypoxic respiratory failure.   Admitted 07/20/18 due to anemia. GI consult obtained. EGD done which showed gastric ulcers and erosions. Given IV iron along with 2 units of PRBC's. Discharged after 2 days.   moderate fatigue with minimal exertion. She says that this is chronic in nature and has been present for several years  Refused paramedicine program.   In the hospital, Admitted 07/30/18  minimal improvement in symptoms with aggressive diuresis -Diffuse expiratory wheezing more consistent with underlying bronchitis, COPD --Neb treatment, consider steroids BNP 07/30/18 was 152.0  Echo report from 07/31/18 reviewed and showed an EF of 45-50%.  She continues to use 2L oxygen and presents in a wheel chair.   Her PCP is worried about possible blockages in her heart. 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   On today's visit she does not have her medication list with her  She drives herself and is following up Susy Frizzle, MD this Friday, 10/12/2018. She continues to see [facility]. The patient has a blood pressure cuff at home, but does not check it. She has not taken any medication as of yet, because she only takes them with food and she hasn't eaten today.   She lives alone and does not have a nurse who visits her.   Today's Blood pressure 172/79 CR 1.05 Glucose 96  EKG personally reviewed by myself on todays visit Shows Sinus  bradycardia. 51 bpm. Left ventricular hypertrophy with repolarization abnormality    OTHER PAST MEDICAL HISTORY REVIEWED BY ME FOR TODAY'S VISIT:  08/14/2018 CT scan showed Cardiac enlargement, aortic atherosclerosis, and multi vessel coronary artery atherosclerotic calcifications. Aortic Atherosclerosis (ICD10-I70.0).  PMH:   has a past medical history of CHF (congestive heart failure) ([place]), Dyslipidemia, Frequent falls, GERD (gastroesophageal reflux disease), HTN (hypertension), Memory loss, PFO (patent foramen ovale), Retinal artery occlusion, and Weakness.  PSH:    Past Surgical History:  Procedure Laterality Date  . ANKLE ARTHROSCOPY WITH OPEN REDUCTION INTERNAL FIXATION (ORIF) Right   . APPENDECTOMY    . COLONOSCOPY N/A 07/21/2018   Procedure: COLONOSCOPY;  Surgeon: Lin Landsman, MD;  Location: [facility] ENDOSCOPY;  Service: Gastroenterology;  Laterality: N/A;  . ESOPHAGOGASTRODUODENOSCOPY N/A 07/21/2018   Procedure: ESOPHAGOGASTRODUODENOSCOPY (EGD);  Surgeon: Lin Landsman, MD;  Location: [facility] ENDOSCOPY;  Service: Gastroenterology;  Laterality: N/A;  . TUBAL LIGATION      Current Outpatient Medications  Medication Sig Dispense Refill  . B Complex-Biotin-FA (B-COMPLEX PO) Take 1 capsule by mouth daily.     . Calcium Carbonate-Vitamin D (CALCIUM + D PO) Take 1 capsule by mouth 2 (two) times daily.     . Cholecalciferol (VITAMIN D) 2000 UNITS CAPS Take 1 capsule by mouth daily.     . dorzolamide (TRUSOPT) 2 % ophthalmic solution Place 1 drop into the left eye 2 (two) times daily.     Marland Kitchen escitalopram (  LEXAPRO) 10 MG tablet TAKE 1 TABLET BY MOUTH  DAILY (Patient taking differently: Take 10 mg by mouth daily. ) 90 tablet 1  . fish oil-omega-3 fatty acids 1000 MG capsule Take 2 g by mouth 2 (two) times daily.      Marland Kitchen latanoprost (XALATAN) 0.005 % ophthalmic solution Place 1 drop into the left eye at bedtime.     Marland Kitchen lisinopril (PRINIVIL,ZESTRIL) 20 MG tablet Take 1 tablet (20 mg  total) by mouth daily. 90 tablet 3  . MELATONIN PO Take 5 mg by mouth at bedtime.     . mometasone (ELOCON) 0.1 % ointment Apply topically daily. 45 g 1  . Multiple Vitamin (MULTIVITAMIN) tablet Take 1 tablet by mouth daily.      . OXYGEN Inhale 2 L into the lungs continuous.    . pantoprazole (PROTONIX) 40 MG tablet Take 1 tablet (40 mg total) by mouth 2 (two) times daily. 60 tablet 3  . potassium chloride SA (K-DUR,KLOR-CON) 20 MEQ tablet TAKE 1 TABLET BY MOUTH ONCE A DAY 30 tablet 3  . pravastatin (PRAVACHOL) 40 MG tablet TAKE 1 TABLET BY MOUTH  DAILY (Patient taking differently: TAKE 1 TABLET BY MOUTH  DAILY) 90 tablet 1  . Tiotropium Bromide-Olodaterol (STIOLTO RESPIMAT IN) Inhale 2 puffs into the lungs every morning.    . traZODone (DESYREL) 50 MG tablet TAKE 1 TABLET BY MOUTH AT  BEDTIME 90 tablet 0  . TURMERIC PO Take 1 capsule by mouth daily.    . vitamin B-12 (CYANOCOBALAMIN) 1000 MCG tablet Take 1,000 mcg by mouth daily.    Marland Kitchen VITAMIN E PO Take 180 mg by mouth daily.      No current facility-administered medications for this visit.      Allergies:   Oxycodone   Social History:  The patient  reports that she quit smoking about 37 years ago. Her smoking use included cigarettes. She has a 15.00 pack-year smoking history. She has never used smokeless tobacco. She reports current alcohol use. She reports that she does not use drugs.   Family History:   family history includes Dementia in her mother; Esophageal cancer in her father.    Review of Systems: Review of Systems  Constitutional: Negative.   Eyes: Negative.   Respiratory: Positive for shortness of breath.   Cardiovascular: Negative.   Gastrointestinal: Negative.   Genitourinary: Negative.   Musculoskeletal: Negative.   Neurological: Negative.   Psychiatric/Behavioral: Negative.   All other systems reviewed and are negative.    PHYSICAL EXAM: VS:  BP (!) 172/79 (BP Location: Left Arm, Patient Position: Sitting,  Cuff Size: Normal)   Pulse (!) 51   Ht 5\' 7"  (1.702 m)   Wt 229 lb 12 oz (104.2 kg)   BMI 35.98 kg/m  , BMI Body mass index is 35.98 kg/m.  Constitutional:  oriented to person, place, and time. No distress.  Obese, presents in a wheelchair HENT:  Head: Grossly normal Eyes:  no discharge. No scleral icterus.  Neck: No JVD, no carotid bruits  Cardiovascular: Regular rate and rhythm, no murmurs appreciated Pulmonary/Chest: Clear to auscultation bilaterally, no wheezes or rails Abdominal: Soft.  no distension.  no tenderness.  Musculoskeletal: Normal range of motion Neurological:  normal muscle tone. Coordination normal. No atrophy Skin: Skin warm and dry Psychiatric: normal affect, pleasant    Recent Labs: 08/01/2018: Magnesium 2.1 08/10/2018: ALT 81; Brain Natriuretic Peptide 116; BUN 16; Creat 1.05; Hemoglobin 10.4; Platelets 328; Potassium 4.3; Sodium 141  Lipid Panel Lab Results  Component Value Date   CHOL 158 03/06/2017   HDL 72 03/06/2017   LDLCALC 64 03/06/2017   TRIG 111 03/06/2017      Wt Readings from Last 3 Encounters:  10/09/18 229 lb 12 oz (104.2 kg)  10/08/18 229 lb 4 oz (104 kg)  09/28/18 233 lb (105.7 kg)       ASSESSMENT AND PLAN:  Acute hypoxic respiratory failure Plan: EKG 12-Lead We will schedule a lexiscan myoview for cardiomyopathy, SOB, CAD on CT scan Recent admission for shortness of breath, less likely CHF, given minimal improvement in symptoms with aggressive diuresis Diffuse expiratory wheezing more consistent with underlying bronchitis, COPD She improved with Neb treatment, conservative therapy  HFpEF LVEF mildly reduced on recent echo.  Discontinue metoprolol and bradycardia Increase lisinopril up to 40 mg daily Start HCTZ  Follow-up lab work with primary care.  May need low-dose supplemental potassium  Sinus bradycardia Stop metoprolol  Essential hypertension Blood pressure elevated today, did not take any of her  morning medications Metoprolol held for bradycardia Changes as above  Frequent falls Lives alone, chronic weakness May need home PT Previously declined  Disposition:   F/U  12 months  Total encounter time more than 25 minutes. Greater than 50% was spent in counseling and coordination of care with the patient.  Orders Placed This Encounter  Procedures  . EKG 12-Lead   Margit Banda  Is acting as a scribe for Ida Rogue, M.D., Ph.D.   I have reviewed the above documentation for accuracy and completeness, and I agree with the above.  Signed, Esmond Plants, M.D., Ph.D. 10/09/2018  [place], [place]

## 2018-10-08 NOTE — Patient Instructions (Signed)
Continue weighing daily and call for an overnight weight gain of > 2 pounds or a weekly weight gain of >5 pounds. 

## 2018-10-08 NOTE — Progress Notes (Signed)
Patient ID: Virginia Walters, female    DOB: [birth year], 78 y.o.   MRN: [id number]  HPI  Virginia Walters is a 78 y/o female with a history of HTN, GERD, memory loss, previous tobacco use, occasional alcohol use and chronic heart failure.   Echo report from 07/31/18 reviewed and showed an EF of 45-50%.  Admitted 07/30/18 due to acute on chronic HF. Cardiology consult obtained. Initially needed IV lasix along with bipap. Transitioned to oxygen by nasal cannula along with oral diuretics. Needed IV solumedrol and then given prednisone. Discharged after 4 days. Admitted 07/20/18 due to anemia. GI consult obtained. EGD done which showed gastric ulcers and erosions. Given IV iron along with 2 units of PRBC's. Discharged after 2 days.   She presents today for a follow-up visit with a chief complaint of moderate fatigue with minimal exertion. She says that this is chronic in nature and has been present for several years. She has a slight weight gain along with this. She denies any difficulty sleeping, dizziness, abdominal distention, palpitations, pedal edema, chest pain, shortness of breath or cough. She wears her oxygen around the clock at 2L. Says that her home weight ranged from [place]. Refused paramedicine program.   Past Medical History:  Diagnosis Date  . CHF (congestive heart failure) ([place])   . Dyslipidemia   . Frequent falls   . GERD (gastroesophageal reflux disease)   . HTN (hypertension)   . Memory loss   . PFO (patent foramen ovale)   . Retinal artery occlusion   . Weakness    Past Surgical History:  Procedure Laterality Date  . ANKLE ARTHROSCOPY WITH OPEN REDUCTION INTERNAL FIXATION (ORIF) Right   . APPENDECTOMY    . COLONOSCOPY N/A 07/21/2018   Procedure: COLONOSCOPY;  Surgeon: Lin Landsman, MD;  Location[facility] ENDOSCOPY;  Service: Gastroenterology;  Laterality: N/A;  . ESOPHAGOGASTRODUODENOSCOPY N/A 07/21/2018   Procedure: ESOPHAGOGASTRODUODENOSCOPY (EGD);  Surgeon: Lin Landsman, MD;  [facility] ENDOSCOPY;  Service: Gastroenterology;  Laterality: N/A;  . TUBAL LIGATION     Family History  Problem Relation Age of Onset  . Esophageal cancer Father   . Dementia Mother    Social History   Tobacco Use  . Smoking status: Former Smoker    Packs/day: 1.00    Years: 15.00    Pack years: 15.00    Types: Cigarettes    Last attempt to quit: 12/27/1980    Years since quitting: 37.8  . Smokeless tobacco: Never Used  Substance Use Topics  . Alcohol use: Yes    Comment: occassionally   Allergies  Allergen Reactions  . Oxycodone Itching   Prior to Admission medications   Medication Sig Start Date End Date Taking? Authorizing Provider  B Complex-Biotin-FA (B-COMPLEX PO) Take 1 capsule by mouth daily.    Yes [provider]  Calcium Carbonate-Vitamin D (CALCIUM + D PO) Take 1 capsule by mouth 2 (two) times daily.    Yes [provider]  Cholecalciferol (VITAMIN D) 2000 UNITS CAPS Take 1 capsule by mouth daily.    Yes [provider]  dorzolamide (TRUSOPT) 2 % ophthalmic solution Place 1 drop into the left eye 2 (two) times daily.    Yes [provider]  escitalopram (LEXAPRO) 10 MG tablet TAKE 1 TABLET BY MOUTH  DAILY Patient taking differently: Take 10 mg by mouth daily.  05/07/18  Yes Susy Frizzle, MD  fish oil-omega-3 fatty acids 1000 MG capsule Take 2 g by mouth  2 (two) times daily.     Yes [provider]  latanoprost (XALATAN) 0.005 % ophthalmic solution Place 1 drop into the left eye at bedtime.  12/05/17  Yes [provider]  lisinopril (PRINIVIL,ZESTRIL) 20 MG tablet Take 1 tablet (20 mg total) by mouth daily. 09/04/17  Yes Susy Frizzle, MD  MELATONIN PO Take 5 mg by mouth at bedtime.    Yes [provider]  metoprolol tartrate (LOPRESSOR) 25 MG tablet TAKE 1 TABLET BY MOUTH TWICE A DAY 09/03/18  Yes Susy Frizzle, MD  mometasone (ELOCON) 0.1 % ointment Apply topically  daily. 02/16/18  Yes Susy Frizzle, MD  Multiple Vitamin (MULTIVITAMIN) tablet Take 1 tablet by mouth daily.     Yes [provider]  OXYGEN Inhale 2 L into the lungs continuous.   Yes [provider]  pantoprazole (PROTONIX) 40 MG tablet Take 1 tablet (40 mg total) by mouth 2 (two) times daily. 07/24/18 10/08/18 Yes Susy Frizzle, MD  potassium chloride SA (K-DUR,KLOR-CON) 20 MEQ tablet TAKE 1 TABLET BY MOUTH ONCE A DAY 09/03/18  Yes Susy Frizzle, MD  pravastatin (PRAVACHOL) 40 MG tablet TAKE 1 TABLET BY MOUTH  DAILY Patient taking differently: TAKE 1 TABLET BY MOUTH  DAILY 05/07/18  Yes Susy Frizzle, MD  Tiotropium Bromide-Olodaterol (STIOLTO RESPIMAT IN) Inhale 2 puffs into the lungs every morning.   Yes [provider]  traZODone (DESYREL) 50 MG tablet TAKE 1 TABLET BY MOUTH AT  BEDTIME 08/30/18  Yes Susy Frizzle, MD  TURMERIC PO Take 1 capsule by mouth daily.   Yes [provider]  vitamin B-12 (CYANOCOBALAMIN) 1000 MCG tablet Take 1,000 mcg by mouth daily.   Yes [provider]  VITAMIN E PO Take 180 mg by mouth daily.    Yes [provider]    Review of Systems  Constitutional: Positive for fatigue (tire easy). Negative for appetite change.  HENT: Negative for congestion, postnasal drip and sore throat.   Eyes: Negative.   Respiratory: Negative for cough and shortness of breath.   Cardiovascular: Negative for chest pain, palpitations and leg swelling.  Gastrointestinal: Negative for abdominal distention and abdominal pain.  Endocrine: Negative.   Genitourinary: Negative.   Musculoskeletal: Negative for arthralgias, back pain and neck pain.  Allergic/Immunologic: Negative.   Neurological: Negative for dizziness and light-headedness.  Hematological: Negative for adenopathy. Does not bruise/bleed easily.  Psychiatric/Behavioral: Negative for dysphoric mood and sleep disturbance (sleeping on 2 pillows with oxygen at  2L). The patient is not nervous/anxious.    Vitals:   10/08/18 1055  BP: (!) 150/66  Pulse: 66  Resp: 18  SpO2: 96%  Weight: 229 lb 4 oz (104 kg)  Height: 5\' 7"  (1.702 m)   Wt Readings from Last 3 Encounters:  10/08/18 229 lb 4 oz (104 kg)  09/28/18 233 lb (105.7 kg)  09/13/18 226 lb (102.5 kg)   Lab Results  Component Value Date   CREATININE 1.05 (H) 08/10/2018   CREATININE 1.13 (H) 08/03/2018   CREATININE 1.16 (H) 08/02/2018    Physical Exam Vitals signs and nursing note reviewed.  Constitutional:      Appearance: Normal appearance.  HENT:     Head: Normocephalic and atraumatic.  Neck:     Musculoskeletal: Normal range of motion and neck supple.  Cardiovascular:     Rate and Rhythm: Regular rhythm. Bradycardia present.  Pulmonary:     Effort: Pulmonary effort is normal.  Breath sounds: No wheezing or rales.  Abdominal:     General: There is no distension.     Palpations: Abdomen is soft.  Musculoskeletal:        General: No tenderness.     Right lower leg: No edema.     Left lower leg: No edema.  Skin:    General: Skin is warm and dry.  Neurological:     General: No focal deficit present.     Mental Status: She is alert and oriented to person, place, and time.  Psychiatric:        Mood and Affect: Mood normal.        Behavior: Behavior normal.    Assessment & Plan:  1: Chronic heart failure with mildly reduced ejection fraction- - NYHA class III - euvolemic today - now weighing daily; reminded to call for an overnight weight gain of >2 pounds or a weekly weight gain of >5 pounds - weight up 3 pounds from last visit here 6 weeks ago - not adding salt but does eat salty foods at times; Reviewed the importance of closely following a 2000mg  sodium diet - EF >40% so would not qualify for entresto - she has no idea what medications she takes; says that she uses a pillbox but can't state what she's taking and shows [place] her discharge summary from December  2019 - patient refused paramedicine program - could change her metoprolol tartrate to succinate at future visits - BNP 07/30/18 was 152.0 - sees cardiology Rockey Situ) 10/09/2018 - PharmD reconciled medications with the patient  2: HTN- - BP mildly elevated today but has been good at previous visit - saw PCP ([place]) 09/28/2018 - BMP from 08/10/18 reviewed and showed sodium 141, potassium 4.3, creatinine 1.05 and GFR 51  Patient did not bring her medications nor a list. Each medication was verbally reviewed with the patient and she was encouraged to bring the bottles to every visit to confirm accuracy of list. Patient keeps saying "I don't know" when asked about the medications.    Return in 3 months or sooner for any questions/problems before then.

## 2018-10-08 NOTE — Progress Notes (Signed)
[place] - PHARMACIST COUNSELING NOTE  ADHERENCE ASSESSMENT  Adherence strategy: pill box   Do you ever forget to take your medication? [] Yes (1) [x] No (0)  Do you ever skip doses due to side effects? [] Yes (1) [x] No (0)  Do you have trouble affording your medicines? [] Yes (1) [x] No (0)  Are you ever unable to pick up your medication due to transportation difficulties? [] Yes (1) [x] No (0)  Do you ever stop taking your medications because you don't believe they are helping? [] Yes (1) [x] No (0)  Total score _0______    Recommendations given to patient about increasing adherence: Patient reports using a pill box and takes her medications every day. She reports that she may miss a dose once a month.  Guideline-Directed Medical Therapy/Evidence Based Medicine    ACE/ARB/ARNI: lisinopril 20 mg   Beta Blocker: metoprolol tartrate 25 mg twice daily   Aldosterone Antagonist: none (EF 45-50%) Diuretic: none (EF 45-50%)    SUBJECTIVE   HPI: Admitted 07/30/18 for HF and received IV Lasix with bipap. Transitioned to oxygen and oral diuretics. Here for follow up appointment.  Past Medical History:  Diagnosis Date  . CHF (congestive heart failure) ([place])   . Dyslipidemia   . Frequent falls   . GERD (gastroesophageal reflux disease)   . HTN (hypertension)   . Memory loss   . PFO (patent foramen ovale)   . Retinal artery occlusion   . Weakness         OBJECTIVE    Vital signs: HR 66, BP 150/66, weight (pounds) 229.4 lb  ECHO: Date 45-50%, EF 07/31/18    BMP Latest Ref Rng & Units 08/10/2018 08/03/2018 08/02/2018  Glucose 65 - 99 mg/dL 96 179(H) 141(H)  BUN 7 - 25 mg/dL 16 42(H) 44(H)  Creatinine 0.60 - 0.93 mg/dL 1.05(H) 1.13(H) 1.16(H)  BUN/Creat Ratio 6 - 22 (calc) 15 - -  Sodium 135 - 146 mmol/L 141 136 139  Potassium 3.5 - 5.3 mmol/L 4.3 4.0 4.1  Chloride 98 - 110 mmol/L 105 100 102  CO2 20 - 32 mmol/L 28 29 30   Calcium  8.6 - 10.4 mg/dL 9.4 9.1 9.0    ASSESSMENT 78 year old female here today for follow up. Reports using a pill box and rarely missing doses but does not know what she is taking other than she takes what is written on the paper (discharge summary from hospitalization in December). Did not bring pill bottles with her to confirm. She does not report any issues with any of her medications.   PLAN No medication changes recommended today. I discussed medications with patient and she did not have any questions. Reminded her to weigh herself daily and report any weight change to provider.    Time spent: 10 minutes  [place], Pharm.D. 10/08/2018 11:14 AM    Current Outpatient Medications:  .  B Complex-Biotin-FA (B-COMPLEX PO), Take 1 capsule by mouth daily. , Disp: , Rfl:  .  Calcium Carbonate-Vitamin D (CALCIUM + D PO), Take 1 capsule by mouth 2 (two) times daily. , Disp: , Rfl:  .  Cholecalciferol (VITAMIN D) 2000 UNITS CAPS, Take 1 capsule by mouth daily. , Disp: , Rfl:  .  dorzolamide (TRUSOPT) 2 % ophthalmic solution, Place 1 drop into the left eye 2 (two) times daily. , Disp: , Rfl:  .  escitalopram (LEXAPRO) 10 MG tablet, TAKE 1 TABLET BY MOUTH  DAILY (Patient taking differently: Take 10 mg  by mouth daily. ), Disp: 90 tablet, Rfl: 1 .  fish oil-omega-3 fatty acids 1000 MG capsule, Take 2 g by mouth 2 (two) times daily.  , Disp: , Rfl:  .  latanoprost (XALATAN) 0.005 % ophthalmic solution, Place 1 drop into the left eye at bedtime. , Disp: , Rfl:  .  lisinopril (PRINIVIL,ZESTRIL) 20 MG tablet, Take 1 tablet (20 mg total) by mouth daily., Disp: 90 tablet, Rfl: 3 .  MELATONIN PO, Take 5 mg by mouth at bedtime. , Disp: , Rfl:  .  metoprolol tartrate (LOPRESSOR) 25 MG tablet, TAKE 1 TABLET BY MOUTH TWICE A DAY, Disp: 60 tablet, Rfl: 3 .  mometasone (ELOCON) 0.1 % ointment, Apply topically daily., Disp: 45 g, Rfl: 1 .  Multiple Vitamin (MULTIVITAMIN) tablet, Take 1 tablet by mouth daily.   , Disp: , Rfl:  .  OXYGEN, Inhale 2 L into the lungs continuous., Disp: , Rfl:  .  pantoprazole (PROTONIX) 40 MG tablet, Take 1 tablet (40 mg total) by mouth 2 (two) times daily., Disp: 60 tablet, Rfl: 3 .  potassium chloride SA (K-DUR,KLOR-CON) 20 MEQ tablet, TAKE 1 TABLET BY MOUTH ONCE A DAY, Disp: 30 tablet, Rfl: 3 .  pravastatin (PRAVACHOL) 40 MG tablet, TAKE 1 TABLET BY MOUTH  DAILY (Patient taking differently: TAKE 1 TABLET BY MOUTH  DAILY), Disp: 90 tablet, Rfl: 1 .  Tiotropium Bromide-Olodaterol (STIOLTO RESPIMAT IN), Inhale 2 puffs into the lungs every morning., Disp: , Rfl:  .  traZODone (DESYREL) 50 MG tablet, TAKE 1 TABLET BY MOUTH AT  BEDTIME, Disp: 90 tablet, Rfl: 0 .  TURMERIC PO, Take 1 capsule by mouth daily., Disp: , Rfl:  .  vitamin B-12 (CYANOCOBALAMIN) 1000 MCG tablet, Take 1,000 mcg by mouth daily., Disp: , Rfl:  .  VITAMIN E PO, Take 180 mg by mouth daily. , Disp: , Rfl:    COUNSELING POINTS/CLINICAL PEARLS  Lisinopril (Goal: 20 to 40 mg once daily)  This drug may cause nausea, vomiting, dizziness, headache, or angioedema of face, lips, throat, or intestines.  Instruct patient to report signs/symptoms of hypotension, or a persistent cough.  Advise patient against sudden discontinuation of drug.  DRUGS TO AVOID IN HEART FAILURE  Drug or Class Mechanism  Analgesics . NSAIDs . COX-2 inhibitors . Glucocorticoids  Sodium and water retention, increased systemic vascular resistance, decreased response to diuretics   Diabetes Medications . Metformin . Thiazolidinediones o Rosiglitazone (Avandia) o Pioglitazone (Actos) . DPP4 Inhibitors o Saxagliptin (Onglyza) o Sitagliptin (Januvia)   Lactic acidosis Possible calcium channel blockade   Unknown  Antiarrhythmics . Class I  o Flecainide o Disopyramide . Class III o Sotalol . Other o Dronedarone  Negative inotrope, proarrhythmic   Proarrhythmic, beta blockade  Negative inotrope   Antihypertensives . Alpha Blockers o Doxazosin . Calcium Channel Blockers o Diltiazem o Verapamil o Nifedipine . Central Alpha Adrenergics o Moxonidine . Peripheral Vasodilators o Minoxidil  Increases renin and aldosterone  Negative inotrope    Possible sympathetic withdrawal  Unknown  Anti-infective . Itraconazole . Amphotericin B  Negative inotrope Unknown  Hematologic . Anagrelide . Cilostazol   Possible inhibition of PD IV Inhibition of PD III causing arrhythmias  Neurologic/Psychiatric . Stimulants . Anti-Seizure Drugs o Carbamazepine o Pregabalin . Antidepressants o Tricyclics o Citalopram . Parkinsons o Bromocriptine o Pergolide o Pramipexole . Antipsychotics o Clozapine . Antimigraine o Ergotamine o Methysergide . Appetite suppressants . Bipolar o Lithium  Peripheral alpha and beta agonist activity  Negative inotrope  and chronotrope Calcium channel blockade  Negative inotrope, proarrhythmic Dose-dependent QT prolongation  Excessive serotonin activity/valvular damage Excessive serotonin activity/valvular damage Unknown  IgE mediated hypersensitivy, calcium channel blockade  Excessive serotonin activity/valvular damage Excessive serotonin activity/valvular damage Valvular damage  Direct myofibrillar degeneration, adrenergic stimulation  Antimalarials . Chloroquine . Hydroxychloroquine Intracellular inhibition of lysosomal enzymes  Urologic Agents . Alpha Blockers o Doxazosin o Prazosin o Tamsulosin o Terazosin  Increased renin and aldosterone  Adapted from Page RL, et al. "Drugs That May Cause or Exacerbate Heart Failure: A Scientific Statement from the [place]." Circulation 2016; 818:H63-J49. DOI: 10.1161/CIR.0000000000000426   MEDICATION ADHERENCES TIPS AND STRATEGIES 1. Taking medication as prescribed improves patient outcomes in heart failure (reduces hospitalizations, improves symptoms, increases  survival) 2. Side effects of medications can be managed by decreasing doses, switching agents, stopping drugs, or adding additional therapy. Please let someone in the [facility] know if you have having bothersome side effects so we can modify your regimen. Do not alter your medication regimen without talking to [place].  3. Medication reminders can help patients remember to take drugs on time. If you are missing or forgetting doses you can try linking behaviors, using pill boxes, or an electronic reminder like an alarm on your phone or an app. Some people can also get automated phone calls as medication reminders.

## 2018-10-09 ENCOUNTER — Encounter: Payer: Self-pay | Admitting: Cardiovascular Disease

## 2018-10-09 ENCOUNTER — Ambulatory Visit (INDEPENDENT_AMBULATORY_CARE_PROVIDER_SITE_OTHER): Payer: Medicare Other | Admitting: Cardiovascular Disease

## 2018-10-09 VITALS — BP 172/79 | HR 51 | Ht 67.0 in | Wt 229.8 lb

## 2018-10-09 DIAGNOSIS — I5022 Chronic systolic (congestive) heart failure: Secondary | ICD-10-CM | POA: Diagnosis not present

## 2018-10-09 DIAGNOSIS — D509 Iron deficiency anemia, unspecified: Secondary | ICD-10-CM

## 2018-10-09 DIAGNOSIS — R0602 Shortness of breath: Secondary | ICD-10-CM

## 2018-10-09 DIAGNOSIS — J441 Chronic obstructive pulmonary disease with (acute) exacerbation: Secondary | ICD-10-CM | POA: Diagnosis not present

## 2018-10-09 MED ORDER — LISINOPRIL 40 MG PO TABS: 40.0000 mg | ORAL_TABLET | Freq: Every day | ORAL | 3 refills | Status: DC

## 2018-10-09 MED ORDER — HYDROCHLOROTHIAZIDE 25 MG PO TABS: 25.0000 mg | ORAL_TABLET | Freq: Every day | ORAL | 3 refills | Status: DC

## 2018-10-09 MED ORDER — POTASSIUM CHLORIDE ER 10 MEQ PO TBCR: 10.0000 meq | EXTENDED_RELEASE_TABLET | Freq: Every day | ORAL | 3 refills | Status: DC

## 2018-10-09 NOTE — Patient Instructions (Addendum)
Medication Instructions:  Your physician has recommended you make the following change in your medication:  1. STOP Metoprolol 2. INCREASE Lisinopril to 40 mg once daily 3. START Potassium 10 mEq once daily 4. START Hydrochlorothiazide 25 mg once daily    If you need a refill on your cardiac medications before your next appointment, please call your pharmacy.    Lab work: No new labs needed   If you have labs (blood work) drawn today and your tests are completely normal, you will receive your results only by: Marland Kitchen MyChart Message (if you have MyChart) OR . A paper copy in the mail If you have any lab test that is abnormal or we need to change your treatment, we will call you to review the results.   Testing/Procedures: We will schedule a lexiscan myoview for cardiomyopathy, SOB, CAD on CT scan [facility] MYOVIEW  Your caregiver has ordered a Stress Test with nuclear imaging. The purpose of this test is to evaluate the blood supply to your heart muscle. This procedure is referred to as a "Non-Invasive Stress Test." This is because other than having an IV started in your vein, nothing is inserted or "invades" your body. Cardiac stress tests are done to find areas of poor blood flow to the heart by determining the extent of coronary artery disease (CAD). Some patients exercise on a treadmill, which naturally increases the blood flow to your heart, while others who are  unable to walk on a treadmill due to physical limitations have a pharmacologic/chemical stress agent called Lexiscan . This medicine will mimic walking on a treadmill by temporarily increasing your coronary blood flow.   Please note: these test may take anywhere between 2-4 hours to complete  PLEASE REPORT TO [facility] AT THE FIRST DESK WILL DIRECT YOU WHERE TO GO  Date of Procedure:_____________________________________  Arrival Time for Procedure:______________________________    PLEASE  NOTIFY THE OFFICE AT LEAST 24 HOURS IN ADVANCE IF YOU ARE UNABLE TO KEEP YOUR APPOINTMENT.  [phone] AND  PLEASE NOTIFY NUCLEAR MEDICINE AT [facility] AT LEAST 24 HOURS IN ADVANCE IF YOU ARE UNABLE TO KEEP YOUR APPOINTMENT. [phone]  How to prepare for your Myoview test:  1. Do not eat or drink after midnight 2. No caffeine for 24 hours prior to test 3. No smoking 24 hours prior to test. 4. Your medication may be taken with water.  If your doctor stopped a medication because of this test, do not take that medication. 5. Ladies, please do not wear dresses.  Skirts or pants are appropriate. Please wear a short sleeve shirt. 6. No perfume, cologne or lotion. 7. Wear comfortable walking shoes. No heels!       Follow-Up: At [facility], you and your health needs are our priority.  As part of our continuing mission to provide you with exceptional heart care, we have created designated Provider Care Teams.  These Care Teams include your primary Cardiologist (physician) and Advanced Practice Providers (APPs -  Physician Assistants and Nurse Practitioners) who all work together to provide you with the care you need, when you need it.  . You will need a follow up appointment in 12 months .   Please call our office 2 months in advance to schedule this appointment.    . Providers on your designated Care Team:   . Murray Hodgkins, NP . Christell Faith, PA-C . Marrianne Mood, PA-C  Any Other Special Instructions Will Be Listed Below (If  Applicable).  For educational health videos Log in to : [email or web address] Or : [email or web address], password : [email or web address]   Cardiac Nuclear Scan A cardiac nuclear scan is a test that is done to check the flow of blood to your heart. It is done when you are resting and when you are exercising. The test looks for problems such as:  Not enough blood reaching a portion of the heart.  The heart muscle not working as it should. You may need this test if:  You have heart  disease.  You have had lab results that are not normal.  You have had heart surgery or a balloon procedure to open up blocked arteries (angioplasty).  You have chest pain.  You have shortness of breath. In this test, a special dye (tracer) is put into your bloodstream. The tracer will travel to your heart. A camera will then take pictures of your heart to see how the tracer moves through your heart. This test is usually done at a hospital and takes 2-4 hours. Tell a doctor about:  Any allergies you have.  All medicines you are taking, including vitamins, herbs, eye drops, creams, and over-the-counter medicines.  Any problems you or family members have had with anesthetic medicines.  Any blood disorders you have.  Any surgeries you have had.  Any medical conditions you have.  Whether you are pregnant or may be pregnant. What are the risks? Generally, this is a safe test. However, problems may occur, such as:  Serious chest pain and heart attack. This is only a risk if the stress portion of the test is done.  Rapid heartbeat.  A feeling of warmth in your chest. This feeling usually does not last long.  Allergic reaction to the tracer. What happens before the test?  Ask your doctor about changing or stopping your normal medicines. This is important.  Follow instructions from your doctor about what you cannot eat or drink.  Remove your jewelry on the day of the test. What happens during the test?  An IV tube will be inserted into one of your veins.  Your doctor will give you a small amount of tracer through the IV tube.  You will wait for 20-40 minutes while the tracer moves through your bloodstream.  Your heart will be monitored with an electrocardiogram (ECG).  You will lie down on an exam table.  Pictures of your heart will be taken for about 15-20 minutes.  You may also have a stress test. For this test, one of these things may be done: ? You will be asked to  exercise on a treadmill or a stationary bike. ? You will be given medicines that will make your heart work harder. This is done if you are unable to exercise.  When blood flow to your heart has peaked, a tracer will again be given through the IV tube.  After 20-40 minutes, you will get back on the exam table. More pictures will be taken of your heart.  Depending on the tracer that is used, more pictures may need to be taken 3-4 hours later.  Your IV tube will be removed when the test is over. The test may vary among doctors and hospitals. What happens after the test?  Ask your doctor: ? Whether you can return to your normal schedule, including diet, activities, and medicines. ? Whether you should drink more fluids. This will help to remove the tracer from your body. Drink enough fluid to keep your pee (urine)  pale yellow.  Ask your doctor, or the department that is doing the test: ? When will my results be ready? ? How will I get my results? Summary  A cardiac nuclear scan is a test that is done to check the flow of blood to your heart.  Tell your doctor whether you are pregnant or may be pregnant.  Before the test, ask your doctor about changing or stopping your normal medicines. This is important.  Ask your doctor whether you can return to your normal activities. You may be asked to drink more fluids. This information is not intended to replace advice given to you by your health care provider. Make sure you discuss any questions you have with your health care provider. Document Released: 01/15/2018 Document Revised: 01/15/2018 Document Reviewed: 01/15/2018 Elsevier Interactive Patient Education  2019 [employer].

## 2018-10-12 ENCOUNTER — Encounter: Payer: Self-pay | Admitting: Family Medicine

## 2018-10-12 ENCOUNTER — Ambulatory Visit (INDEPENDENT_AMBULATORY_CARE_PROVIDER_SITE_OTHER): Payer: Medicare Other | Admitting: Family Medicine

## 2018-10-12 VITALS — BP 130/80 | HR 52 | Temp 98.4°F | Resp 20 | Ht 65.0 in | Wt 232.0 lb

## 2018-10-12 DIAGNOSIS — R0902 Hypoxemia: Secondary | ICD-10-CM

## 2018-10-12 MED ORDER — BUDESONIDE-FORMOTEROL FUMARATE 160-4.5 MCG/ACT IN AERO: 2.0000 | INHALATION_SPRAY | Freq: Two times a day (BID) | RESPIRATORY_TRACT | 3 refills | Status: DC

## 2018-10-12 NOTE — Progress Notes (Signed)
Subjective:    Patient ID: Virginia Walters, female    DOB: 08/23/1940, 78 y.o.   MRN: [id number]  HPI 07/30/18 Patient recently was admitted to the hospital with acute shortness of breath.  She was found to have a hemoglobin of 5.3!.  She received 2 units of blood and her hemoglobin at discharge was 7.8.  EGD was performed and revealed gastric ulcers and erosions that were thought to be the source of the bleeding.  Presents today with her family member due to altered mental status.  Over the weekend, the patient left her home in the afternoon wearing nothing but her night gown and diaper.  She walked to a neighbor's house.  She could not get them at the door and therefore she sat in their car wrapped up in their coats until someone came home.  She was too confused to go back to her own house that she had just walked from.  They called her family member who came and got her.  Today the patient seems very lethargic.  Her reaction time is extremely slow.  I performed a quick Mini-Mental status exam.  She is able to correctly tell me the date.  However she can only remember 2 out of 3 objects on recall.  She cannot perform serial sevens.  On a clock drawing, she draws a circle.  She draws the numbers outside the circle and incorrectly spaced.  She cannot even draw hands on a clock.  She has a difficult time even answering my questions.  She has increased respiratory rate.  Pulse oximetry on room air is 89%.  On pulmonary exam, both lungs are congested with pronounced bibasilar crackles concerning for possible bilateral pneumonia.  At that time, my plan was: Altered mental status, recent hospital admission for an upper GI bleed and profound anemia, worsening confusion, bibasilar crackles, borderline hypoxemia with a room pulse oximetry of 17%   Patient is certainly not at her baseline.  She is very confused.  I am concerned about delirium possibly due to hospital-acquired pneumonia versus a urinary tract  infection.  She is unable to provide a urine sample here at the clinic today.  However given her confusion, the pronounced bibasilar crackles, the borderline hypoxemia, I believe the patient requires emergency room evaluation for possible pneumonia.  I would recommend chest x-ray, urinalysis, and blood work to rule out worsening anemia.  I recommended that she go to the emergency room and will notify the emergency room of her impending arrival.   08/03/18 Was admitted to the hospital after the last visit.  I have copied the discharge summary below for reference: DATE OF ADMISSION:  07/30/2018  ADMITTING PHYSICIAN: Saundra Shelling, MD  DATE OF DISCHARGE: 12/202/2019  PRIMARY CARE PHYSICIAN: Susy Frizzle, MD   ADMISSION DIAGNOSIS:  Acute respiratory failure with hypoxia ([place]) [J96.01] Acute kidney injury superimposed on chronic kidney disease ([place]) [N17.9, N18.9] Acute on chronic congestive heart failure, unspecified heart failure type ([place]) [I50.9] Hypokalemia DISCHARGE DIAGNOSIS:  Acute respiratory failure with hypoxia Acute on chronic systolic heart failure exacerbation Acute COPD exacerbation Hypokalemia Hypertension GERD SECONDARY DIAGNOSIS:       Past Medical History:  Diagnosis Date  . Dyslipidemia   . Frequent falls   . GERD (gastroesophageal reflux disease)   . HTN (hypertension)   . Memory loss   . PFO (patent foramen ovale)   . Retinal artery occlusion   . Weakness      ADMITTING HISTORY [name] G1  y.o.femalewith a known history of hyperlipidemia, chronic systolic heart failure, PFO,retinal artery occlusion GERDpresented to the emergency room for shortness of breath.Patient's oxygen saturation was around 76%.She was put on BiPAP and stabilized.Initially she was given a round of IV Solu-Medrol and nebulization treatments.No history of any COPD on the old reviewed documented chart.Patient appears congested and has pedal  edema.IV Lasix has been ordered and being given in the emergency room.No complaints of chest pain.Oxygen saturation improved after loading on BiPAP.Hospitalist service was consulted for further care.   HOSPITAL COURSE:  Was admitted to stepdown unit for respiratory distress and hypoxia on BiPAP.  Patient was diuresed with IV Lasix and echocardiogram was done and cardiology evaluation.  Potassium was aggressively supplemented during the hospitalization.  Patient was weaned off BiPAP and transition to oxygen by nasal cannula.  Patient continued beta-blocker, ACE inhibitor and Lasix for heart failure along with electrolyte supplementation.  Patient received physical therapy.  She was offered home health services at the time of discharge.  Patient also received IV Solu-Medrol and aggressive nebulization treatments for COPD exacerbation.  She was transitioned to oral steroids.  Was followed by pulmonary attending and cardiology attending during hospitalization.  Repeat echo showed EF of 45%.  MRSA PCR was negative during the work-up in the hospital .Patient will be discharged home follow-up with cardiology in the clinic along with primary care physician.  08/10/18 Patient is here today for follow-up.  She is accompanied by her family member.  She is currently on oxygen.  Now that she is on oxygen, she is mentating closer to her baseline.  Her family member states that she is much better than she was.  However we still do not have a good explanation as to why the patient was so hypoxic to begin with.  Certainly she has systolic failure with an ejection fraction of 45% and diffuse hypokinesis.  However her oxygen was out of proportion to what one would expect with this particular given a chest x-ray without pulmonary edema.  Is on appropriate medications with metoprolol and lisinopril.  Family is not certain if the patient has a follow-up appointment to see the cardiologist for a stress test and to  follow-up her systolic failure.  She was diagnosed with COPD in the hospital.  However I performed pulmonary function test today in my office.  The patient's FEV1 was 1.27 L which is 65% of predicted.  Her FVC was 1.68 L which was 62% of predicted her FEV1 to FVC ratio was 76%.  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  She does have a remote history of smoking however she has not smoked in years and has not been diagnosed with COPD to her knowledge in the past.  She has not seen a pulmonologist prior to this.  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  However I believe that most recently she was experiencing delirium on top of this secondary to severe hypoxia.  My concern is that we have still not isolated the cause of the hypoxia.  Certainly her anemia could be playing a role.  I will repeat her CBC to ensure that her anemia is improving.  At the hospital on the 16th it was 9.8 which is improved from 7.8 previously.  It is also possible that her congestive heart failure was playing a role however her ejection fraction was only  45%.  She is on appropriate medication.  I will ensure adequate follow-up with cardiology as the patient may benefit from a stress test given the diffuse hypokinesis seen on the EKG.  However I am concerned that there is an underlying pulmonary issue also contributing to the hypoxia particular given the restrictive pattern seen on her pulmonary function test and the abnormal breath sounds she had previously.  Therefore I will obtain a CT scan of the chest with contrast to evaluate for any evidence of pulmonary fibrosis, etc.  Also when to rule out a pulmonary embolism given her hypoxia and recent hospitalization.  09/13/18 CT revealed: IMPRESSION: 1. No specific findings identified to suggest interstitial lung disease. 2. Diminished lung  volumes with subsegmental atelectasis and dependent changes in the posterior lung bases. 3. Increase caliber of the main pulmonary artery which may be secondary to PA hypertension. 4. Cardiac enlargement, aortic atherosclerosis, and multi vessel coronary artery atherosclerotic calcifications. Aortic Atherosclerosis (ICD10-I70.0). 5. Scattered small solid pulmonary nodules measuring less than 5 mm. No follow-up needed if patient is low-risk (and has no known or suspected primary neoplasm). Non-contrast chest CT can be considered in 12 months if patient is high-risk. This recommendation follows the consensus statement: Guidelines for Management of Incidental Pulmonary Nodules Detected on CT Images: From the Fleischner Society 2017; Radiology 2017; 284:228-243. 6. Part solid nodule identified within the left upper lobe. Follow-up non-contrast CT recommended at 3-6 months to confirm persistence. If unchanged, and solid component remains <6 mm, annual CT is recommended until 5 years of stability has been established. If persistent these nodules should be considered highly suspicious if the solid component of the nodule is 6 mm or greater in size and enlarging. This recommendation follows the consensus statement: Guidelines for Management of Incidental Pulmonary Nodules Detected on CT Images:From the Fleischner Society 2017; published online before print (10.1148/radiol.IJ:2314499).  09/13/18 Patient did not see the cardiologist.  They canceled that appointment.  She is still on oxygen.  Her family member state that when she is wearing her oxygen, her mentation is better, her ability to ambulate is better.  Her functional status is better.  However she frequently takes her oxygen off at night when she is sleeping by accident and therefore in the mornings, she can sometimes be confused and short of breath.  I took the patient's oxygen off.  She is 93% resting on room air.  With ambulation, she  drops to 88% and only 60 feet.  At rest, her lungs are clear to auscultation bilaterally however after walking 60 feet, she began to audibly wheeze and demonstrate rhonchorous breath sounds suggesting obstructive lung disease.  She states that she does have a history of asthma in the past.  I also performed a Mini-Mental status exam today.  The patient is able to remember 3 out of 3 objects on recall.  She can spell world in reverse.  She has a difficult time performing serial sevens however.  Patient's family is concerned that she may be drinking as well at home.  She has a past history of alcohol use.  At that time, my plan was: CT of the lungs did not reveal any specific cause of her hypoxia.  Pulmonary function test have been unrevealing however her exam today suggest reversible obstruction.  Therefore I am going to empirically try the patient on Symbicort 160/4.5, 2 puffs inhaled twice daily and reassess the patient in 2 to 3 weeks to see if her breathing has improved.  I encouraged the patient to follow-up as planned with cardiology.  Given her hypoxia, she may benefit from a stress test to rule out underlying ischemia as a potential cause of her shortness of breath however I suspect COPD is more likely the cause.  I also believe the patient has mild underlying dementia.  I have recommended having someone stay with her at night to avoid unnecessary falls and also because I am worried about sundowning potentially causing her to take off her oxygen.  We discussed potentially moving to an assisted living facility and the patient is completely resistant to this.  09/28/18 Patient denies seeing any substantial benefit since starting Symbicort.  They have also not contacted the cardiologist as far as aging outpatient cardiology evaluation given her recent admission with pulmonary edema and her ejection fraction of 45% with diffuse hypokinesis.  However I took the patient's oxygen off today.  She maintain oxygen  saturations of 94% sitting.  We ambulated around the office and the patient maintained an oxygen saturation between 91 and 94% and even after 100 feet she was still 94% despite not taking or wearing her oxygen.  Objectively this seems to show an improvement in her respiratory function which I would attribute to the Symbicort.  Her lungs are much clear to auscultation today.  There are no crackles or wheezes or rails.  Her family states that they feel like she is doing better since starting the inhaler.  At that time, my plan was: I am still not certain as to the cause of her hypoxia.  Patient did have pulmonary edema however her echocardiogram was not impressive.  I did recommend outpatient cardiology evaluation to determine if the patient requires a stress test to evaluate for any evidence of coronary artery disease attributing to her cardiomyopathy.  However objectively, the patient seemed to improve on Symbicort and is no longer hypoxic with ambulation.  Therefore I will discontinue the Symbicort and try to maximize therapy by switching the patient to Stiolto 2 inhalations a day.  I will recheck the patient in 2 weeks to see how she is doing.  Hopefully at that time we can discontinue her oxygen.  Despite results of her pulmonary function test, objectively she seems to be responding to bronchodilator therapy suggesting perhaps a component of COPD playing a role in her hypoxia   10/12/18 Patient states that she feels worse since starting Stiolto.  She states that she gets choked on the medication when she uses it.  It makes her cough.  She felt like the Symbicort was helping her breathing more.  She is 93% on room air without her oxygen.  We ambulated 100 feet today in our clinic and her oxygen saturations maintained at 94%.  Her lungs are much clear to auscultation bilaterally.  There are no wheezes or crackles or rails.  She denies any shortness of breath although she is very winded after walking 100 feet  primarily due to deconditioning.  She is having to walk with a cane due to deconditioning.  She recently saw the cardiologist who is recommended a stress test.  Past Medical History:  Diagnosis Date  . CHF (congestive heart failure) ([place])   . Dyslipidemia   . Frequent falls   . GERD (gastroesophageal reflux disease)   . HTN (hypertension)   . Memory loss   . PFO (patent foramen ovale)   . Retinal artery occlusion   . Weakness    Past Surgical History:  Procedure Laterality Date  .  ANKLE ARTHROSCOPY WITH OPEN REDUCTION INTERNAL FIXATION (ORIF) Right   . APPENDECTOMY    . COLONOSCOPY N/A 07/21/2018   Procedure: COLONOSCOPY;  Surgeon: Lin Landsman, MD;  Location: [facility] ENDOSCOPY;  Service: Gastroenterology;  Laterality: N/A;  . ESOPHAGOGASTRODUODENOSCOPY N/A 07/21/2018   Procedure: ESOPHAGOGASTRODUODENOSCOPY (EGD);  Surgeon: Lin Landsman, MD;  Location[facility] ENDOSCOPY;  Service: Gastroenterology;  Laterality: N/A;  . TUBAL LIGATION     Current Outpatient Medications on File Prior to Visit  Medication Sig Dispense Refill  . B Complex-Biotin-FA (B-COMPLEX PO) Take 1 capsule by mouth daily.     . Calcium Carbonate-Vitamin D (CALCIUM + D PO) Take 1 capsule by mouth 2 (two) times daily.     . Cholecalciferol (VITAMIN D) 2000 UNITS CAPS Take 1 capsule by mouth daily.     . dorzolamide (TRUSOPT) 2 % ophthalmic solution Place 1 drop into the left eye 2 (two) times daily.     Marland Kitchen escitalopram (LEXAPRO) 10 MG tablet TAKE 1 TABLET BY MOUTH  DAILY (Patient taking differently: Take 10 mg by mouth daily. ) 90 tablet 1  . fish oil-omega-3 fatty acids 1000 MG capsule Take 2 g by mouth 2 (two) times daily.      . hydrochlorothiazide (HYDRODIURIL) 25 MG tablet Take 1 tablet (25 mg total) by mouth daily. 90 tablet 3  . latanoprost (XALATAN) 0.005 % ophthalmic solution Place 1 drop into the left eye at bedtime.     Marland Kitchen lisinopril (PRINIVIL,ZESTRIL) 40 MG tablet Take 1 tablet (40 mg total) by mouth  daily. 90 tablet 3  . MELATONIN PO Take 5 mg by mouth at bedtime.     . mometasone (ELOCON) 0.1 % ointment Apply topically daily. 45 g 1  . Multiple Vitamin (MULTIVITAMIN) tablet Take 1 tablet by mouth daily.      . OXYGEN Inhale 2 L into the lungs continuous.    . potassium chloride (K-DUR) 10 MEQ tablet Take 1 tablet (10 mEq total) by mouth daily. 90 tablet 3  . pravastatin (PRAVACHOL) 40 MG tablet TAKE 1 TABLET BY MOUTH  DAILY (Patient taking differently: TAKE 1 TABLET BY MOUTH  DAILY) 90 tablet 1  . Tiotropium Bromide-Olodaterol (STIOLTO RESPIMAT IN) Inhale 2 puffs into the lungs every morning.    . traZODone (DESYREL) 50 MG tablet TAKE 1 TABLET BY MOUTH AT  BEDTIME 90 tablet 0  . TURMERIC PO Take 1 capsule by mouth daily.    . vitamin B-12 (CYANOCOBALAMIN) 1000 MCG tablet Take 1,000 mcg by mouth daily.    Marland Kitchen VITAMIN E PO Take 180 mg by mouth daily.     . pantoprazole (PROTONIX) 40 MG tablet Take 1 tablet (40 mg total) by mouth 2 (two) times daily. 60 tablet 3   No current facility-administered medications on file prior to visit.    Allergies  Allergen Reactions  . Oxycodone Itching   Social History   Socioeconomic History  . Marital status: Widowed    Spouse name: Not on file  . Number of children: 1  . Years of education: HS  . Highest education level: Not on file  Occupational History  . Occupation: Retired  Scientific laboratory technician  . Financial resource strain: Not on file  . Food insecurity:    Worry: Not on file    Inability: Not on file  . Transportation needs:    Medical: Not on file    Non-medical: Not on file  Tobacco Use  . Smoking status: Former Smoker  Packs/day: 1.00    Years: 15.00    Pack years: 15.00    Types: Cigarettes    Last attempt to quit: 12/27/1980    Years since quitting: 37.8  . Smokeless tobacco: Never Used  Substance and Sexual Activity  . Alcohol use: Yes    Comment: occassionally  . Drug use: No  . Sexual activity: Not Currently  Lifestyle    . Physical activity:    Days per week: Not on file    Minutes per session: Not on file  . Stress: Not on file  Relationships  . Social connections:    Talks on phone: Not on file    Gets together: Not on file    Attends religious service: Not on file    Active member of club or organization: Not on file    Attends meetings of clubs or organizations: Not on file    Relationship status: Not on file  . Intimate partner violence:    Fear of current or ex partner: Not on file    Emotionally abused: Not on file    Physically abused: Not on file    Forced sexual activity: Not on file  Other Topics Concern  . Not on file  Social History Narrative   Lives at home alone.   Right-handed.   No daily use of caffeine.     Review of Systems  All other systems reviewed and are negative.      Objective:   Physical Exam Constitutional:      General: She is not in acute distress.    Appearance: She is not ill-appearing, toxic-appearing or diaphoretic.  Neck:     Vascular: No carotid bruit.  Cardiovascular:     Rate and Rhythm: Normal rate and regular rhythm.     Heart sounds: Normal heart sounds. No murmur.  Pulmonary:     Effort: Pulmonary effort is normal.     Breath sounds: No wheezing, rhonchi or rales.  Musculoskeletal:     Right lower leg: No edema.     Left lower leg: No edema.  Neurological:     General: No focal deficit present.     Mental Status: She is alert. Mental status is at baseline.     Cranial Nerves: Cranial nerves are intact.     Sensory: Sensation is intact.     Coordination: Coordination is intact.     Gait: Gait is intact.           Assessment & Plan:  Patient's hypoxia has improved.  She no longer requires oxygen.  I believe this is due to a combination of COPD as the patient did clinically respond to Symbicort as well as underlying CHF.  I will defer management of her underlying CHF to her cardiologist however I have recommended that we resume  Symbicort 160/4.5, 2 puffs inhaled twice daily as management for her COPD as this seemingly symptomatically improved her shortness of breath and also helped her hypoxia.  We will discontinue her oxygen.  Discontinue Stiolto.  Await the results of her stress test with her cardiologist

## 2018-10-19 ENCOUNTER — Other Ambulatory Visit: Payer: Self-pay

## 2018-10-19 ENCOUNTER — Ambulatory Visit
Admission: RE | Admit: 2018-10-19 | Discharge: 2018-10-19 | Disposition: A | Discharge status: Home / Self Care | Payer: Medicare Other | Source: Ambulatory Visit | Attending: Cardiovascular Disease | Admitting: Cardiovascular Disease

## 2018-10-19 DIAGNOSIS — R0602 Shortness of breath: Secondary | ICD-10-CM | POA: Diagnosis not present

## 2018-10-19 LAB — NM MYOCAR MULTI W/SPECT W/WALL MOTION / EF
Estimated workload: 1 METS
Exercise duration (min): 0 min
Exercise duration (sec): 0 s
LV dias vol: 109 mL (ref 46–106)
LV sys vol: 43 mL
MPHR: 143 {beats}/min
Peak HR: 80 {beats}/min
Percent HR: 55 %
Rest HR: 64 {beats}/min
TID: 0.98

## 2018-10-19 MED ORDER — TECHNETIUM TC 99M TETROFOSMIN IV KIT
31.1000 | PACK | Freq: Once | INTRAVENOUS | Status: AC | PRN
  Administered 2018-10-19: 31.1 via INTRAVENOUS

## 2018-10-19 MED ORDER — REGADENOSON 0.4 MG/5ML IV SOLN
0.4000 mg | Freq: Once | INTRAVENOUS | Status: AC
  Administered 2018-10-19: 0.4 mg via INTRAVENOUS

## 2018-10-19 MED ORDER — TECHNETIUM TC 99M TETROFOSMIN IV KIT
10.7400 | PACK | Freq: Once | INTRAVENOUS | Status: AC | PRN
  Administered 2018-10-19: 10.74 via INTRAVENOUS

## 2018-10-22 ENCOUNTER — Telehealth: Payer: Self-pay | Admitting: *Deleted

## 2018-10-22 DIAGNOSIS — H401123 Primary open-angle glaucoma, left eye, severe stage: Secondary | ICD-10-CM | POA: Diagnosis not present

## 2018-10-22 DIAGNOSIS — H401112 Primary open-angle glaucoma, right eye, moderate stage: Secondary | ICD-10-CM | POA: Diagnosis not present

## 2018-10-22 DIAGNOSIS — H01009 Unspecified blepharitis unspecified eye, unspecified eyelid: Secondary | ICD-10-CM | POA: Diagnosis not present

## 2018-10-22 NOTE — Telephone Encounter (Signed)
Left voicemail message to call back for results.  

## 2018-10-22 NOTE — Telephone Encounter (Signed)
-----   Message from Minna Merritts, MD sent at 10/19/2018  6:24 PM EST ----- Stress test No significant ischemia Normal ejection fraction Overall looks good Would stay on the statin,  there is coronary artery and aortic calcification noted

## 2018-10-23 NOTE — Telephone Encounter (Signed)
-----   Message from Minna Merritts, MD sent at 10/19/2018  6:24 PM EST ----- Stress test No significant ischemia Normal ejection fraction Overall looks good Would stay on the statin,  there is coronary artery and aortic calcification noted

## 2018-10-23 NOTE — Telephone Encounter (Signed)
Patient calling to discuss recent testing results  ° °Please call  ° °

## 2018-10-23 NOTE — Telephone Encounter (Signed)
Attempted to call patient. LMTCB 10/23/2018   

## 2018-10-23 NOTE — Telephone Encounter (Signed)
Returned call to patient to review stress test results. She verbalized understanding and is cooperative with POC.   No further orders at this time.   Advised pt to call for any further questions or concerns.

## 2018-11-05 DIAGNOSIS — R062 Wheezing: Secondary | ICD-10-CM | POA: Diagnosis not present

## 2018-11-05 DIAGNOSIS — I5033 Acute on chronic diastolic (congestive) heart failure: Secondary | ICD-10-CM | POA: Diagnosis not present

## 2018-11-09 ENCOUNTER — Other Ambulatory Visit: Payer: Self-pay | Admitting: Family Medicine

## 2018-11-10 ENCOUNTER — Other Ambulatory Visit: Payer: Self-pay | Admitting: Family Medicine

## 2018-12-02 ENCOUNTER — Other Ambulatory Visit: Payer: Self-pay | Admitting: Family Medicine

## 2018-12-06 DIAGNOSIS — I5033 Acute on chronic diastolic (congestive) heart failure: Secondary | ICD-10-CM | POA: Diagnosis not present

## 2018-12-06 DIAGNOSIS — R062 Wheezing: Secondary | ICD-10-CM | POA: Diagnosis not present

## 2018-12-10 ENCOUNTER — Other Ambulatory Visit: Payer: Self-pay | Admitting: *Deleted

## 2018-12-10 MED ORDER — HYDROCHLOROTHIAZIDE 25 MG PO TABS: 25.0000 mg | ORAL_TABLET | Freq: Every day | ORAL | 3 refills | Status: DC

## 2018-12-11 ENCOUNTER — Other Ambulatory Visit: Payer: Self-pay | Admitting: *Deleted

## 2018-12-11 MED ORDER — PANTOPRAZOLE SODIUM 40 MG PO TBEC: DELAYED_RELEASE_TABLET | ORAL | 3 refills | Status: DC

## 2018-12-11 MED ORDER — BUDESONIDE-FORMOTEROL FUMARATE 160-4.5 MCG/ACT IN AERO: 2.0000 | INHALATION_SPRAY | Freq: Two times a day (BID) | RESPIRATORY_TRACT | 3 refills | Status: DC

## 2018-12-13 ENCOUNTER — Other Ambulatory Visit: Payer: Self-pay | Admitting: Family Medicine

## 2018-12-13 MED ORDER — POTASSIUM CHLORIDE ER 10 MEQ PO TBCR: 10.0000 meq | EXTENDED_RELEASE_TABLET | Freq: Every day | ORAL | 3 refills | Status: DC

## 2018-12-13 MED ORDER — LISINOPRIL 40 MG PO TABS: 40.0000 mg | ORAL_TABLET | Freq: Every day | ORAL | 3 refills | Status: DC

## 2019-01-05 DIAGNOSIS — R062 Wheezing: Secondary | ICD-10-CM | POA: Diagnosis not present

## 2019-01-05 DIAGNOSIS — I5033 Acute on chronic diastolic (congestive) heart failure: Secondary | ICD-10-CM | POA: Diagnosis not present

## 2019-01-08 ENCOUNTER — Telehealth: Payer: Self-pay

## 2019-01-08 ENCOUNTER — Other Ambulatory Visit: Payer: Self-pay

## 2019-01-08 ENCOUNTER — Ambulatory Visit: Payer: Medicare Other | Attending: Family | Admitting: Family

## 2019-01-08 ENCOUNTER — Encounter: Payer: Self-pay | Admitting: Family

## 2019-01-08 VITALS — Wt 239.0 lb

## 2019-01-08 DIAGNOSIS — I10 Essential (primary) hypertension: Secondary | ICD-10-CM

## 2019-01-08 DIAGNOSIS — I5022 Chronic systolic (congestive) heart failure: Secondary | ICD-10-CM

## 2019-01-08 NOTE — Patient Instructions (Signed)
Continue weighing daily and call for an overnight weight gain of > 2 pounds or a weekly weight gain of >5 pounds. 

## 2019-01-08 NOTE — Progress Notes (Signed)
Virtual Visit via Telephone Note   Evaluation Performed:  Follow-up visit  This visit type was conducted due to national recommendations for restrictions regarding the COVID-19 Pandemic (e.g. social distancing).  This format is felt to be most appropriate for this patient at this time.  All issues noted in this document were discussed and addressed.  No physical exam was performed (except for noted visual exam findings with Video Visits).  Please refer to the patient's chart (MyChart message for video visits and phone note for telephone visits) for the patient's consent to telehealth for [facility]  Date:  01/08/2019   ID:  Virginia Walters, DOB Mar 10, 1941, MRN [id number]  Patient Location:  [place] [address]   Provider location:   [facility] [place] [address], [place] [address]  PCP:  Susy Frizzle, MD  Cardiologist:  Ida Rogue, MD Electrophysiologist:  None   Chief Complaint:  Fatigue  History of Present Illness:    Virginia Walters is a 78 y.o. female who presents via audio/video conferencing for a telehealth visit today.  Patient verified DOB and address.  The patient does not have symptoms concerning for COVID-19 infection (fever, chills, cough, or new SHORTNESS OF BREATH).   Patient reports moderate fatigue upon minimal exertion. She describes this as chronic in nature having been present for several months. She has associated difficulty sleeping and rhinorrhea along with this. She denies any dizziness, swelling in her legs/ abdomen, palpitations, chest pain, shortness of breath or overnight weight gain. She does report a gradual weight gain as she's been eating more and staying in due to COVID-19.   Prior CV studies:   The following studies were reviewed today:  Echo report from 07/31/18 reviewed and showed an EF of 45-50%  Past Medical History:  Diagnosis Date  . CHF (congestive heart failure)  ([place])   . Dyslipidemia   . Frequent falls   . GERD (gastroesophageal reflux disease)   . HTN (hypertension)   . Memory loss   . PFO (patent foramen ovale)   . Retinal artery occlusion   . Weakness    Past Surgical History:  Procedure Laterality Date  . ANKLE ARTHROSCOPY WITH OPEN REDUCTION INTERNAL FIXATION (ORIF) Right   . APPENDECTOMY    . COLONOSCOPY N/A 07/21/2018   Procedure: COLONOSCOPY;  Surgeon: Lin Landsman, MD;  Location: [facility] ENDOSCOPY;  Service: Gastroenterology;  Laterality: N/A;  . ESOPHAGOGASTRODUODENOSCOPY N/A 07/21/2018   Procedure: ESOPHAGOGASTRODUODENOSCOPY (EGD);  Surgeon: Lin Landsman, MD;  Location: [facility] ENDOSCOPY;  Service: Gastroenterology;  Laterality: N/A;  . TUBAL LIGATION       Current Meds  Medication Sig  . B Complex-Biotin-FA (B-COMPLEX PO) Take 1 capsule by mouth daily.   . budesonide-formoterol (SYMBICORT) 160-4.5 MCG/ACT inhaler Inhale 2 puffs into the lungs 2 (two) times daily.  . Calcium Carbonate-Vitamin D (CALCIUM + D PO) Take 1 capsule by mouth 2 (two) times daily.   . Cholecalciferol (VITAMIN D) 2000 UNITS CAPS Take 1 capsule by mouth daily.   . dorzolamide (TRUSOPT) 2 % ophthalmic solution Place 1 drop into the left eye 2 (two) times daily.   Marland Kitchen escitalopram (LEXAPRO) 10 MG tablet TAKE 1 TABLET BY MOUTH  DAILY  . fish oil-omega-3 fatty acids 1000 MG capsule Take 2 g by mouth 2 (two) times daily.    . hydrochlorothiazide (HYDRODIURIL) 25 MG tablet Take 1 tablet (25 mg total) by mouth daily.  Marland Kitchen latanoprost (  XALATAN) 0.005 % ophthalmic solution Place 1 drop into the left eye at bedtime.   Marland Kitchen lisinopril (ZESTRIL) 40 MG tablet Take 1 tablet (40 mg total) by mouth daily.  Marland Kitchen MELATONIN PO Take 5 mg by mouth at bedtime.   . mometasone (ELOCON) 0.1 % ointment Apply topically daily.  . Multiple Vitamin (MULTIVITAMIN) tablet Take 1 tablet by mouth daily.    . potassium chloride (K-DUR) 10 MEQ tablet Take 1 tablet (10 mEq total) by mouth  daily.  . pravastatin (PRAVACHOL) 40 MG tablet TAKE 1 TABLET BY MOUTH  DAILY  . traZODone (DESYREL) 50 MG tablet TAKE 1 TABLET BY MOUTH AT  BEDTIME  . TURMERIC PO Take 1 capsule by mouth daily.  . verapamil (CALAN-SR) 240 MG CR tablet TAKE 1 TABLET BY MOUTH AT  BEDTIME  . vitamin B-12 (CYANOCOBALAMIN) 1000 MCG tablet Take 1,000 mcg by mouth daily.  Marland Kitchen VITAMIN E PO Take 180 mg by mouth daily.      Allergies:   Oxycodone   Social History   Tobacco Use  . Smoking status: Former Smoker    Packs/day: 1.00    Years: 15.00    Pack years: 15.00    Types: Cigarettes    Last attempt to quit: 12/27/1980    Years since quitting: 38.0  . Smokeless tobacco: Never Used  Substance Use Topics  . Alcohol use: Yes    Comment: occassionally  . Drug use: No     Family Hx: The patient's family history includes Dementia in her mother; Esophageal cancer in her father.  ROS:   Please see the history of present illness.     All other systems reviewed and are negative.   Labs/Other Tests and Data Reviewed:    Recent Labs: 08/01/2018: Magnesium 2.1 08/10/2018: ALT 81; Brain Natriuretic Peptide 116; BUN 16; Creat 1.05; Hemoglobin 10.4; Platelets 328; Potassium 4.3; Sodium 141   Recent Lipid Panel Lab Results  Component Value Date/Time   CHOL 158 03/06/2017 09:11 AM   TRIG 111 03/06/2017 09:11 AM   HDL 72 03/06/2017 09:11 AM   CHOLHDL 2.2 03/06/2017 09:11 AM   LDLCALC 64 03/06/2017 09:11 AM    Wt Readings from Last 3 Encounters:  01/08/19 239 lb (108.4 kg)  10/12/18 232 lb (105.2 kg)  10/09/18 229 lb 12 oz (104.2 kg)     Exam:    Vital Signs:  Wt 239 lb (108.4 kg) Comment: self-reported  BMI 39.77 kg/m    Well nourished, well developed female in no  acute distress.   ASSESSMENT & PLAN:    1. Chronic heart failure with mildly reduced ejection fraction- - NYHA class III - euvolemic today based on patient's description of symptoms - now weighing daily; reminded to call for an  overnight weight gain of >2 pounds or a weekly weight gain of >5 pounds - not adding salt but does eat salty foods at times; Reviewed the importance of closely following a 2000mg  sodium diet - EF >40% so would not qualify for entresto - could change her metoprolol tartrate to succinate at future visits - BNP 07/30/18 was 152.0 - saw cardiology Rockey Situ) 10/09/2018  2: HTN- - not checking her BP at home - saw PCP ([place]) 10/12/2018 - BMP from 08/10/18 reviewed and showed sodium 141, potassium 4.3, creatinine 1.05 and GFR 51   COVID-19 Education: The signs and symptoms of COVID-19 were discussed with the patient and how to seek care for testing (follow up with PCP or arrange E-visit).  The importance of social distancing was discussed today.  Patient Risk:   After full review of this patients clinical status, I feel that they are at least moderate risk at this time.  Time:   Today, I have spent 10 minutes with the patient with telehealth technology discussing diet, weight and symptoms to report.     Medication Adjustments/Labs and Tests Ordered: Current medicines are reviewed at length with the patient today.  Concerns regarding medicines are outlined above.   Tests Ordered: No orders of the defined types were placed in this encounter.  Medication Changes: No orders of the defined types were placed in this encounter.   Disposition:  Follow-up in 3 months or sooner for any questions/problems before then.   Signed, Alisa Graff, FNP  01/08/2019 11:13 AM    [facility]

## 2019-01-08 NOTE — Telephone Encounter (Signed)
   TELEPHONE CALL NOTE  This patient has been deemed a candidate for follow-up tele-health visit to limit community exposure during the Covid-19 pandemic. I spoke with the patient via phone to discuss instructions. The patient was advised to review the section on consent for treatment as well. The patient will receive a phone call 2-3 days prior to their E-Visit at which time consent will be verbally confirmed. A Virtual Office Visit appointment type has been scheduled for 01/08/2019 with Darylene Price FNP.  Gaylord Shih, CMA 01/08/2019 8:44 AM

## 2019-01-08 NOTE — Telephone Encounter (Signed)
TELEPHONE CALL NOTE  Virginia Walters has been deemed a candidate for a follow-up tele-health visit to limit community exposure during the Covid-19 pandemic. I spoke with the patient via phone to ensure availability of phone/video source, confirm preferred email & phone number, discuss instructions and expectations, and review consent.   I reminded Virginia Walters to be prepared with any vital sign and/or heart rhythm information that could potentially be obtained via home monitoring, at the time of her visit.  Finally, I reminded Virginia Walters to expect an e-mail containing a link for their video-based visit approximately 15 minutes before her visit, or alternatively, a phone call at the time of her visit if her visit is planned to be a phone encounter.  Did the patient verbally consent to treatment as below? YES  Gaylord Shih, CMA 01/08/2019 8:44 AM  CONSENT FOR TELE-HEALTH VISIT - PLEASE REVIEW  I hereby voluntarily request, consent and authorize [facility] and its employed or contracted physicians, physician assistants, nurse practitioners or other licensed health care professionals (the Practitioner), to provide me with telemedicine health care services (the "Services") as deemed necessary by the treating Practitioner. I acknowledge and consent to receive the Services by the Practitioner via telemedicine. I understand that the telemedicine visit will involve communicating with the Practitioner through telephonic communication technology and the disclosure of certain medical information by electronic transmission. I acknowledge that I have been given the opportunity to request an in-person assessment or other available alternative prior to the telemedicine visit and am voluntarily participating in the telemedicine visit.  I understand that I have the right to withhold or withdraw my consent to the use of telemedicine in the course of my care at any time, without  affecting my right to future care or treatment, and that the Practitioner or I may terminate the telemedicine visit at any time. I understand that I have the right to inspect all information obtained and/or recorded in the course of the telemedicine visit and may receive copies of available information for a reasonable fee.  I understand that some of the potential risks of receiving the Services via telemedicine include:  Marland Kitchen Delay or interruption in medical evaluation due to technological equipment failure or disruption; . Information transmitted may not be sufficient (e.g. poor resolution of images) to allow for appropriate medical decision making by the Practitioner; and/or  . In rare instances, security protocols could fail, causing a breach of personal health information.  Furthermore, I acknowledge that it is my responsibility to provide information about my medical history, conditions and care that is complete and accurate to the best of my ability. I acknowledge that Practitioner's advice, recommendations, and/or decision may be based on factors not within their control, such as incomplete or inaccurate data provided by me or lack of visual representation. I understand that the practice of medicine is not an exact science and that Practitioner makes no warranties or guarantees regarding treatment outcomes. I acknowledge that I will receive a copy of this consent concurrently upon execution via email to the email address I last provided but may also request a printed copy by calling the office of [facility].    I understand that my insurance may be billed for this visit.   I have read or had this consent read to me. . I understand the contents of this consent, which adequately explains the benefits and risks of the Services being provided via telemedicine.  Marland Kitchen  I have been provided ample opportunity to ask questions regarding this consent and the Services and have had my questions  answered to my satisfaction. . I give my informed consent for the services to be provided through the use of telemedicine in my medical care  By participating in this telemedicine visit I agree to the above.

## 2019-02-04 ENCOUNTER — Other Ambulatory Visit: Payer: Self-pay | Admitting: Family Medicine

## 2019-02-05 NOTE — Telephone Encounter (Signed)
Requested Prescriptions   Pending Prescriptions Disp Refills  . traZODone (DESYREL) 50 MG tablet [Pharmacy Med Name: TRAZODONE  50MG   TAB] 90 tablet 0    Sig: TAKE 1 TABLET BY MOUTH AT  BEDTIME   Last OV 10/12/2018 Last written 11/02/2018

## 2019-03-11 ENCOUNTER — Other Ambulatory Visit: Payer: Self-pay | Admitting: Family Medicine

## 2019-03-26 ENCOUNTER — Other Ambulatory Visit: Payer: Self-pay | Admitting: Family Medicine

## 2019-04-09 ENCOUNTER — Ambulatory Visit: Payer: Medicare Other | Admitting: Family

## 2019-04-25 DIAGNOSIS — H401113 Primary open-angle glaucoma, right eye, severe stage: Secondary | ICD-10-CM | POA: Diagnosis not present

## 2019-04-25 DIAGNOSIS — H3411 Central retinal artery occlusion, right eye: Secondary | ICD-10-CM | POA: Diagnosis not present

## 2019-04-25 DIAGNOSIS — H34232 Retinal artery branch occlusion, left eye: Secondary | ICD-10-CM | POA: Diagnosis not present

## 2019-04-25 DIAGNOSIS — H401122 Primary open-angle glaucoma, left eye, moderate stage: Secondary | ICD-10-CM | POA: Diagnosis not present

## 2019-04-25 LAB — HM DIABETES EYE EXAM

## 2019-04-30 ENCOUNTER — Other Ambulatory Visit: Payer: Self-pay | Admitting: Family Medicine

## 2019-05-06 ENCOUNTER — Telehealth: Payer: Self-pay

## 2019-05-06 NOTE — Telephone Encounter (Signed)
Mary from [facility] called to report that pt's A1C is 5.8 and she will also fax the results.

## 2019-06-06 ENCOUNTER — Ambulatory Visit (INDEPENDENT_AMBULATORY_CARE_PROVIDER_SITE_OTHER): Payer: Medicare Other

## 2019-06-06 ENCOUNTER — Other Ambulatory Visit: Payer: Self-pay

## 2019-06-06 DIAGNOSIS — Z23 Encounter for immunization: Secondary | ICD-10-CM

## 2019-08-15 IMAGING — CR DG CHEST 2V
2 series · 2 of 2 positions shown · non-contrast
Comparison: 07/31/2018

CLINICAL DATA: CHF

EXAM:
CHEST - 2 VIEW

[chest lat]
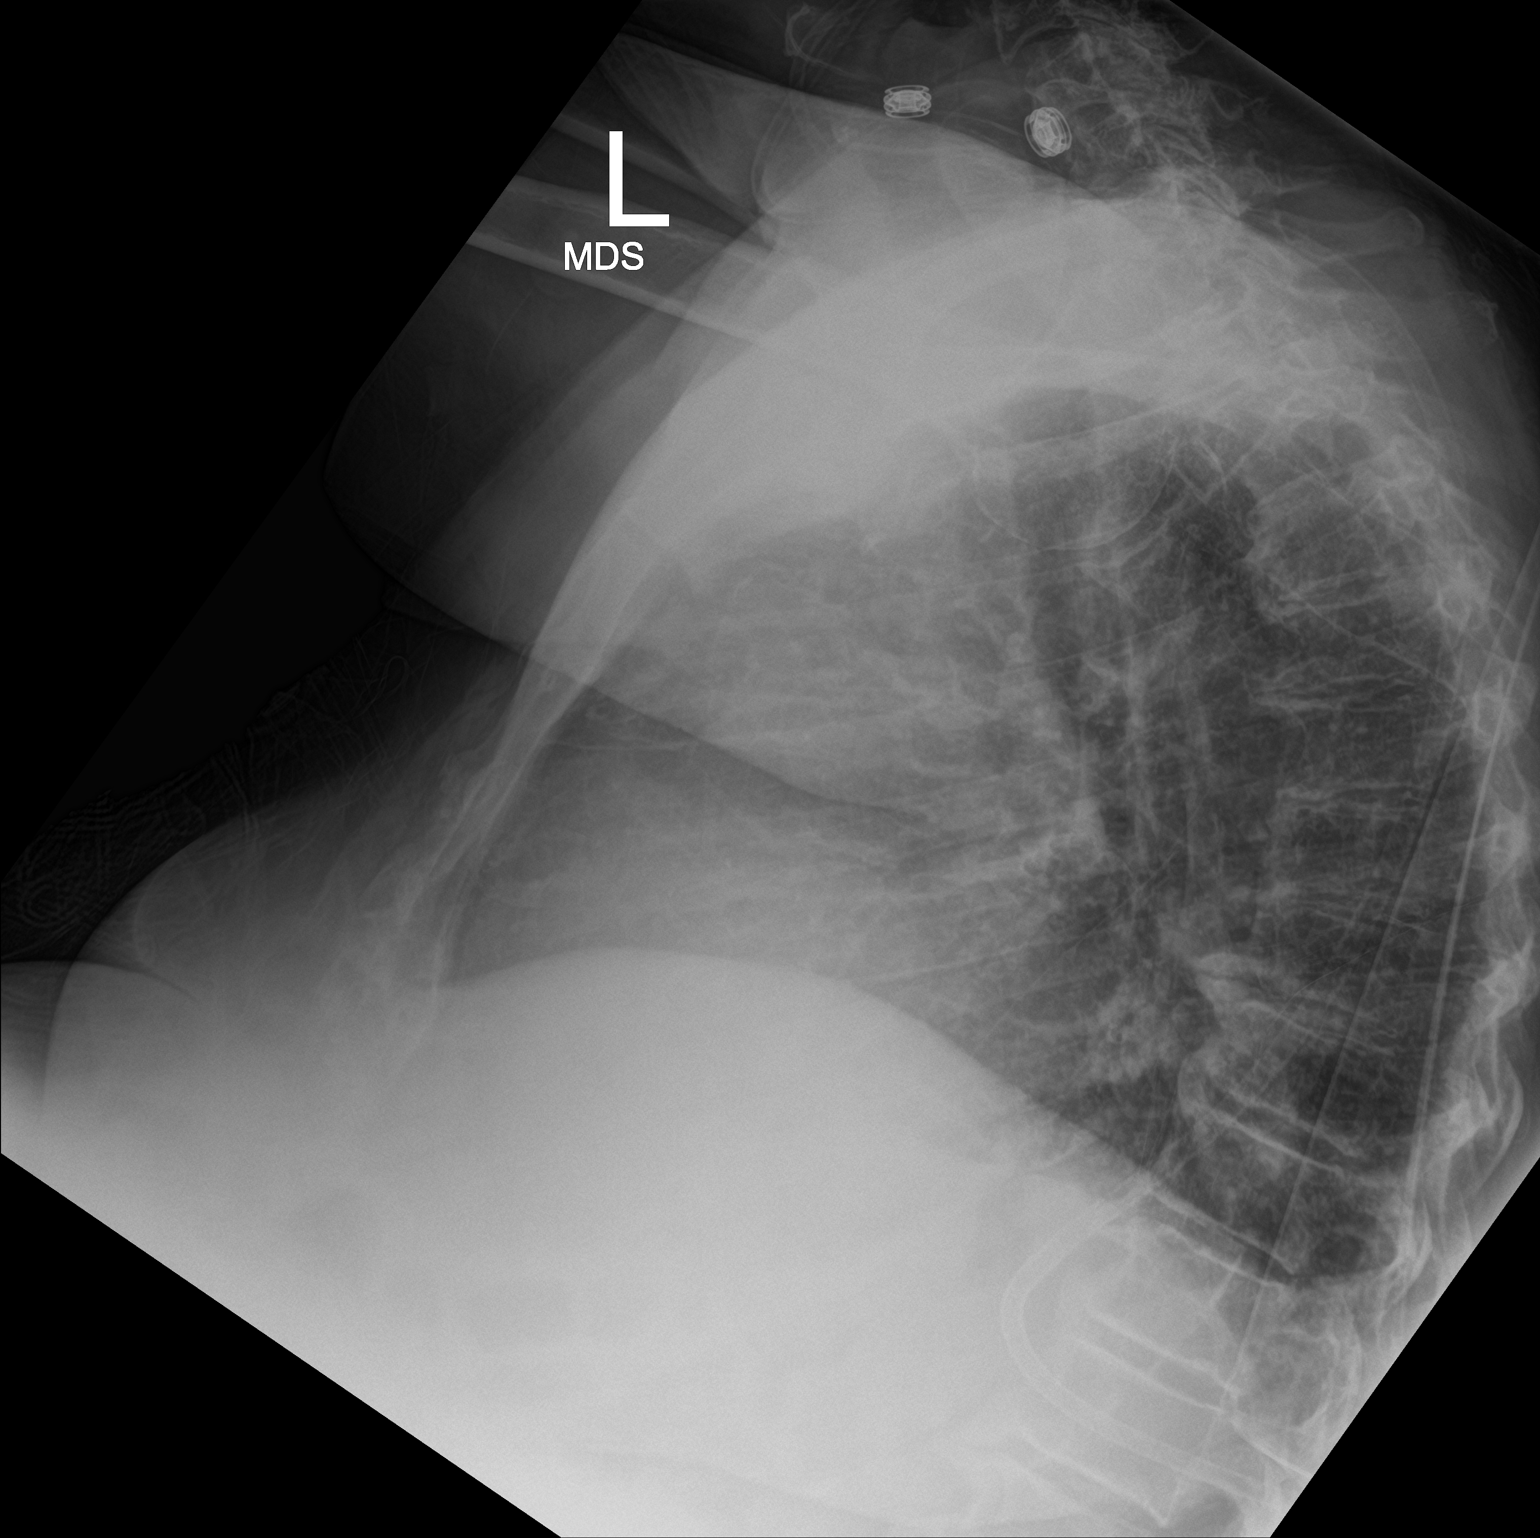

[chest ap]
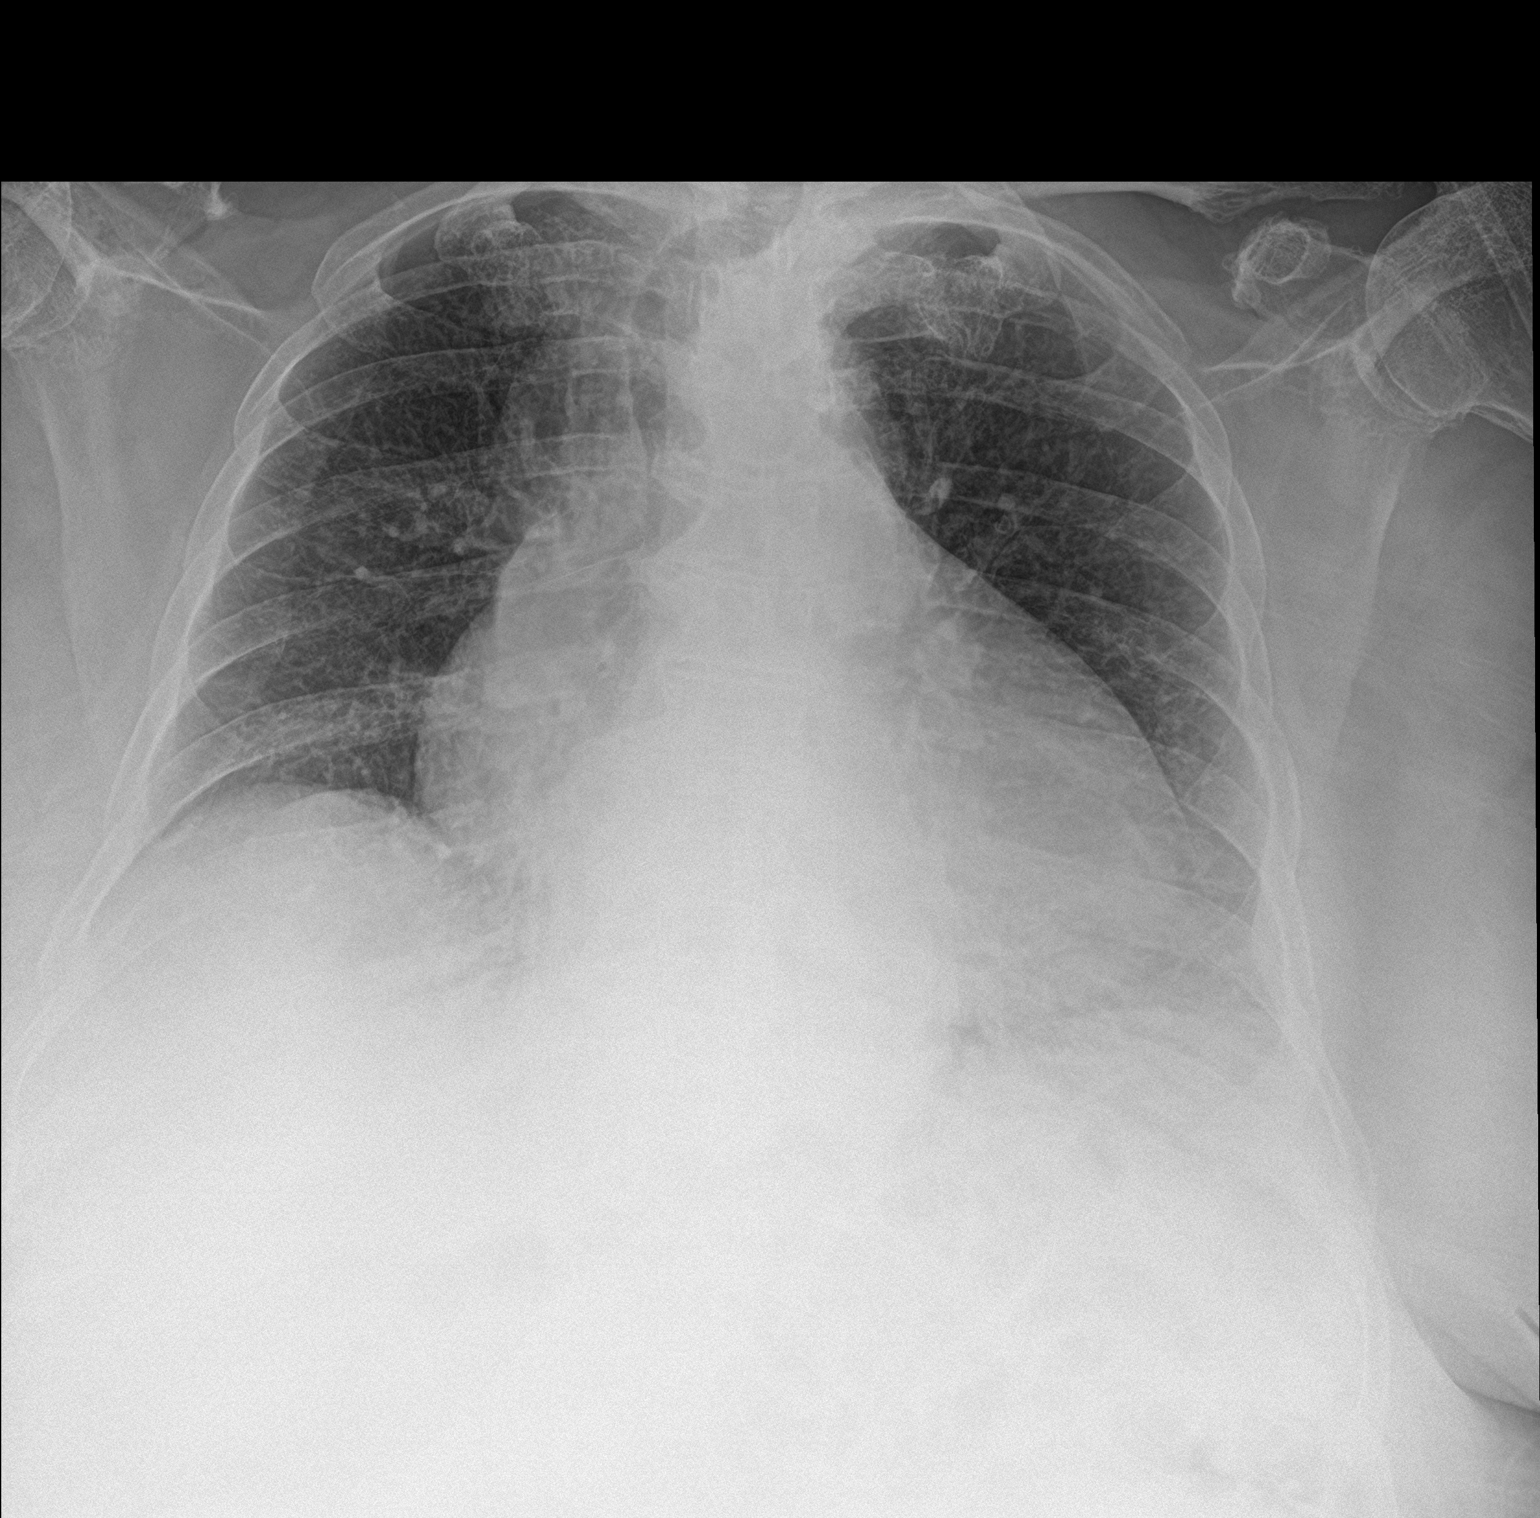

[2 of 2 positions shown; findings below may reference images not displayed]

FINDINGS: Cardiomegaly. No confluent airspace opacities, effusions or edema.
No acute bony abnormality.
IMPRESSION: Cardiomegaly.  No active disease.

## 2019-08-28 IMAGING — CT CT CHEST W/ CM
2 of 4 series · 14 of 36 positions shown, 17 images · IV contrast (iopamidol)
Comparison: None

CLINICAL DATA: .:
CLINICAL DATA: .
Hypoxia

EXAM:
CT CHEST WITH CONTRAST
TECHNIQUE: Multidetector CT imaging of the chest was performed during
intravenous contrast administration.
CONTRAST:  75mL BE3PM9-9FF IOPAMIDOL (BE3PM9-9FF) INJECTION 61%

[Series 2: axial chest · axial · 0.57mm/px · z∈[-1122,-900]mm · 11 of 133 slices shown, 14 images]
[im 11/133  mediastinal]
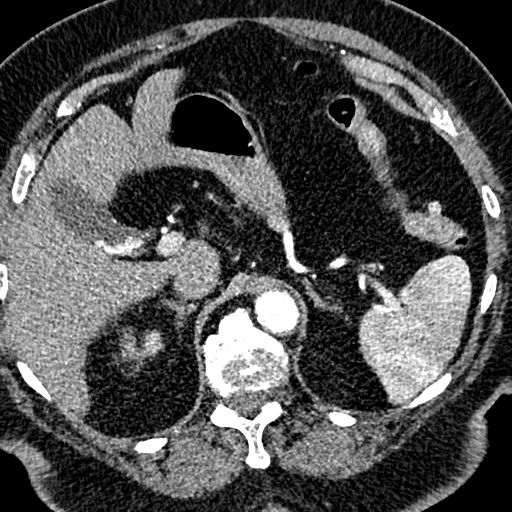
[im 11/133  lung]
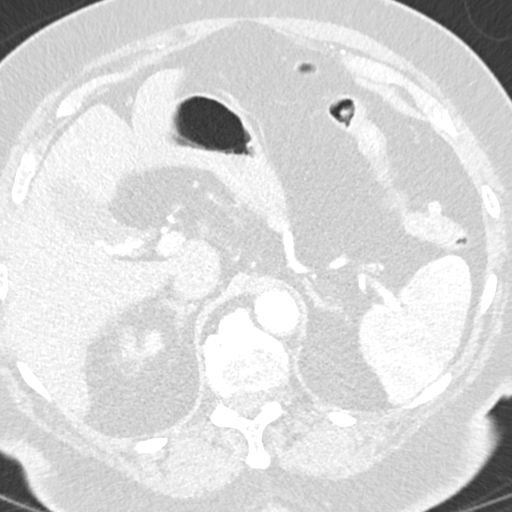
[im 21/133  lung]
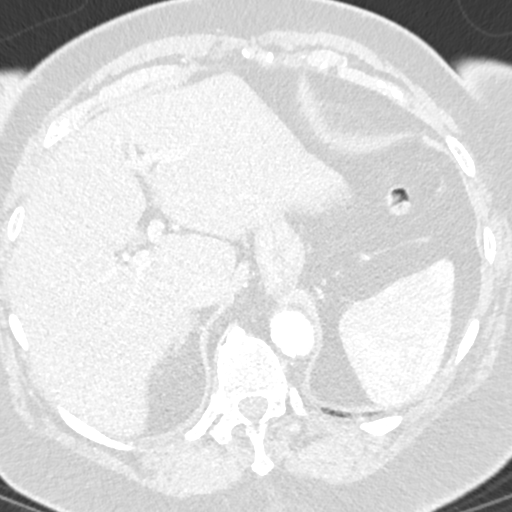
[im 31/133  lung]
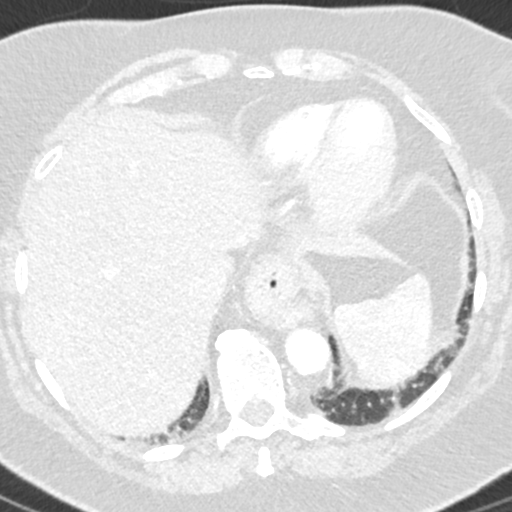
[im 41/133  lung]
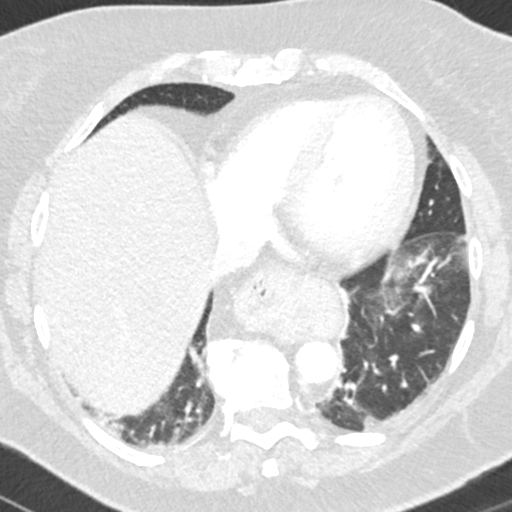
[im 51/133  mediastinal]
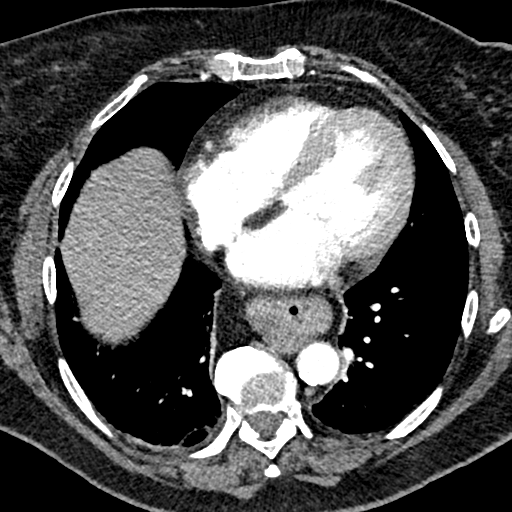
[im 51/133  lung]
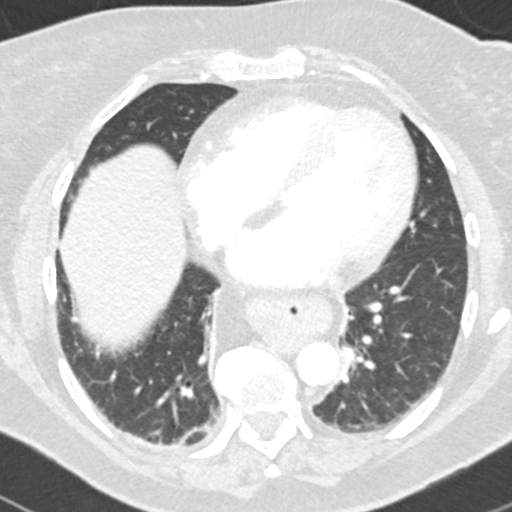
[im 72/133  lung]
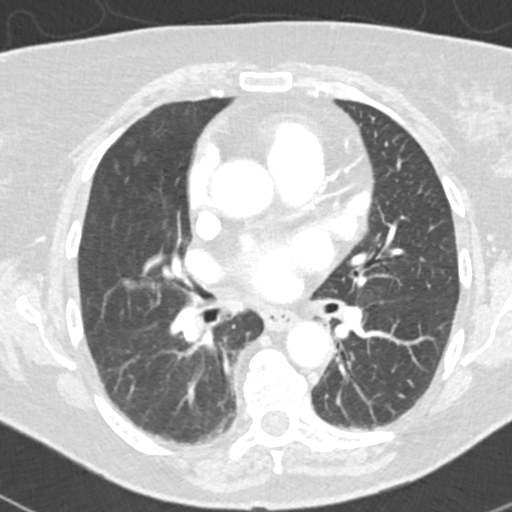
[im 82/133  lung]
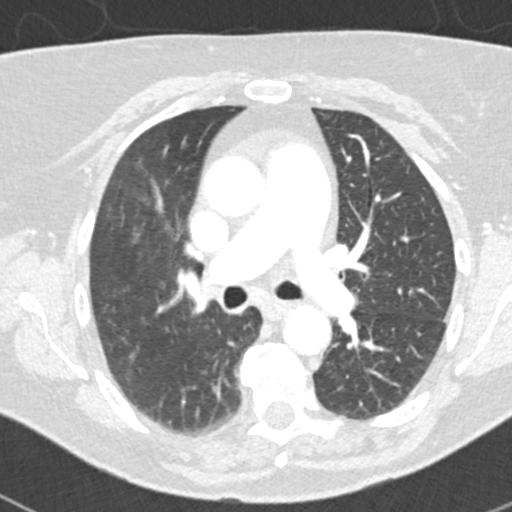
[im 92/133  lung]
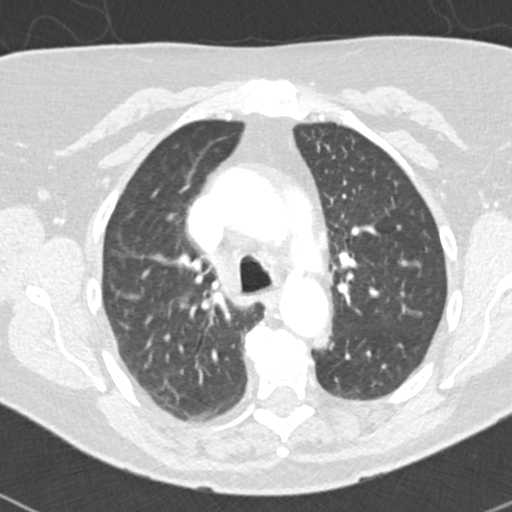
[im 102/133  mediastinal]
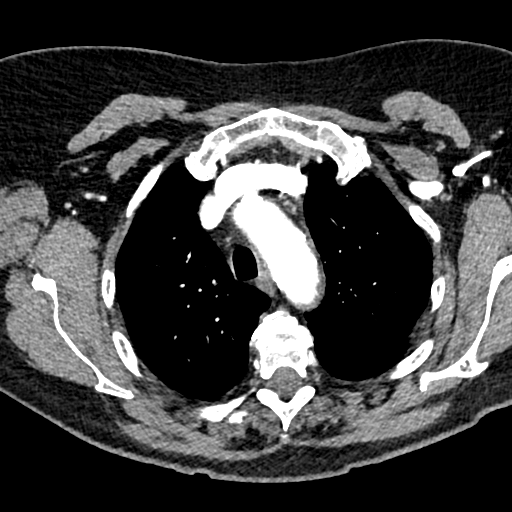
[im 102/133  lung]
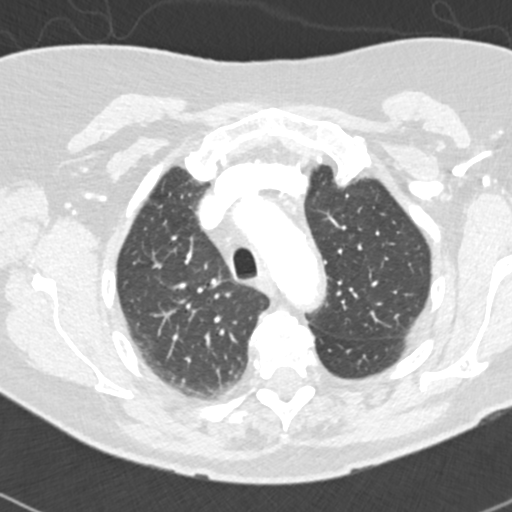
[im 112/133  lung]
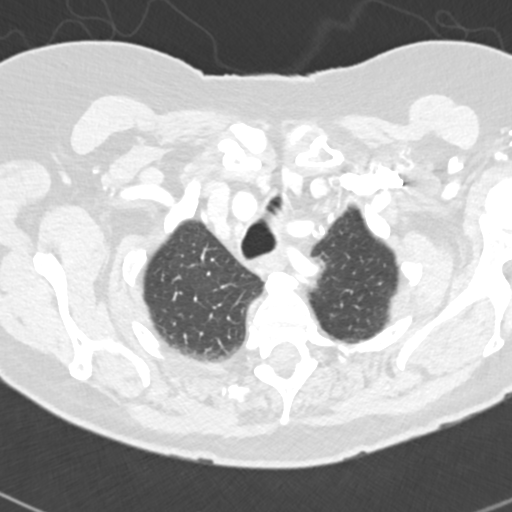
[im 122/133  lung]
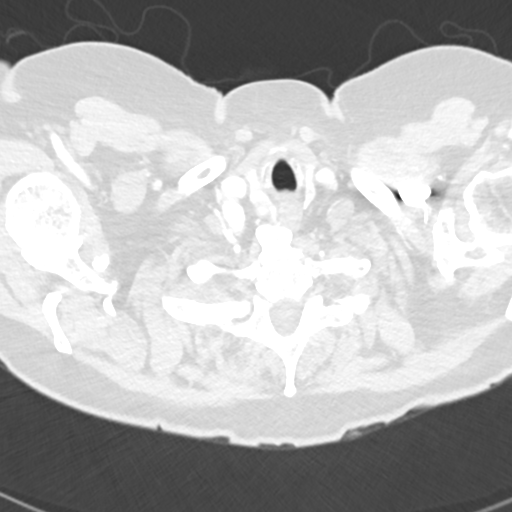

[Series 4: coronal chest · coronal · 0.52mm/px · 3 of 148 slices shown]
[im 30/148  lung]
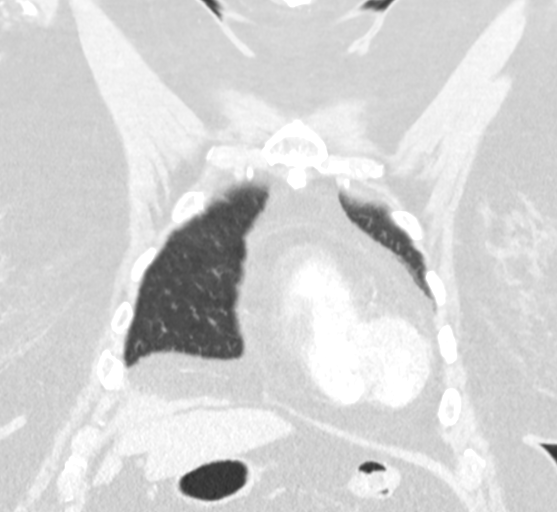
[im 59/148  lung]
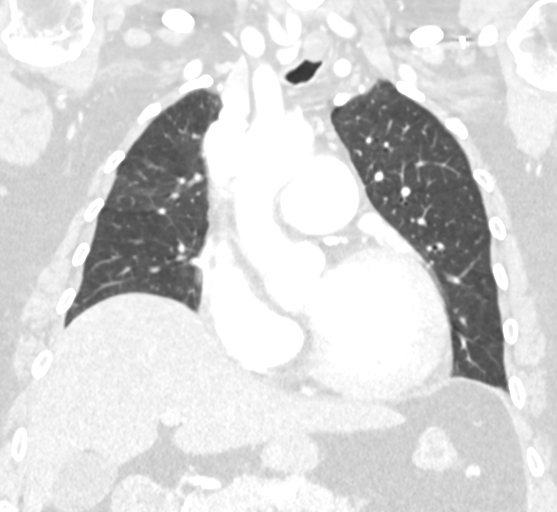
[im 89/148  lung]
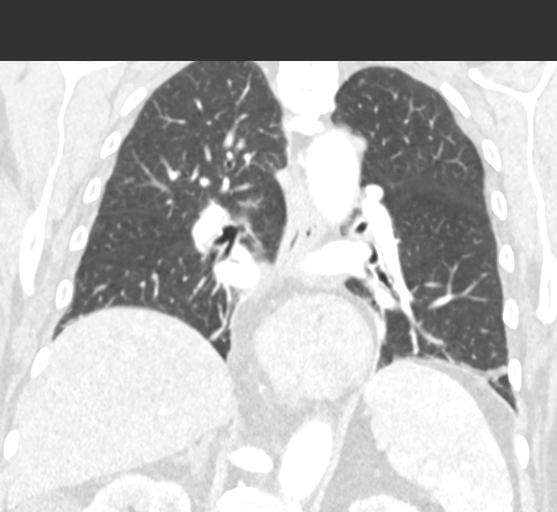

[14 of 36 positions shown; findings below may reference images not displayed]

FINDINGS: Cardiovascular: Moderate cardiac enlargement. Aortic
atherosclerosis. Calcification within the LAD and RCA coronary
arteries noted. No pericardial effusion. The main pulmonary artery
measures 3.7 cm in diameter. No central obstructing pulmonary
embolus identified. Although study not tailored to evaluate for
acute PE, no lobar pulmonary artery filling defects identified.

Mediastinum/Nodes: Normal appearance of the thyroid gland. The
trachea appears patent and is midline. Moderate size hiatal hernia.
No mediastinal, axillary, supraclavicular, or hilar adenopathy.

Lungs/Pleura: No pleural effusions identified. Dependent changes
with subsegmental atelectasis identified within both lung bases. No
airspace consolidation or pneumothorax identified. Pulmonary
parenchymal detail is somewhat diminished secondary to respiratory
motion artifact. Within this limitation there is no evidence for
interstitial reticulation, subpleural banding or honeycombing. No
significant bronchiectasis identified. 6 mm part solid nodule with a
2 mm solid component is identified in the left upper lobe, image
[DATE]. 3 mm solid nodule identified within the right upper lobe,
image [DATE]. 5 mm perifissural nodule noted in the posterolateral
left upper lobe. In the anteromedial right upper lobe there is a 3
mm solid subpleural nodule.

Upper Abdomen: The multiple small stones identified within the
dependent portion of the gallbladder. A right kidney cyst is
partially visualized. No acute findings identified within the upper
abdomen.

Musculoskeletal: Multi level spondylosis identified. No acute or
significant osseous findings identified.
IMPRESSION: 1. No specific findings identified to suggest interstitial lung
disease.
2. Diminished lung volumes with subsegmental atelectasis and
dependent changes in the posterior lung bases.
3. Increase caliber of the main pulmonary artery which may be
secondary to PA hypertension.
4. Cardiac enlargement, aortic atherosclerosis, and multi vessel
coronary artery atherosclerotic calcifications. Aortic
Atherosclerosis ([id number]-TP7.7).
5. Scattered small solid pulmonary nodules measuring less than 5 mm.
No follow-up needed if patient is low-risk (and has no known or
suspected primary neoplasm). Non-contrast chest CT can be considered
in 12 months if patient is high-risk. This recommendation follows
the consensus statement: Guidelines for Management of Incidental
Pulmonary Nodules Detected on CT Images: From the [HOSPITAL]
6. Part solid nodule identified within the left upper lobe.
Follow-up non-contrast CT recommended at 3-6 months to confirm
persistence. If unchanged, and solid component remains <6 mm, annual
CT is recommended until 5 years of stability has been established.
If persistent these nodules should be considered highly suspicious
if the solid component of the nodule is 6 mm or greater in size and
enlarging. This recommendation follows the consensus statement:
Guidelines for Management of Incidental Pulmonary Nodules Detected
on CT Images:From the [HOSPITAL] 5567; published online
before print (10.1148/radiol.[id number]).
7. Gallstones
8. Hiatal hernia.

## 2019-09-20 ENCOUNTER — Telehealth: Payer: Self-pay | Admitting: Family Medicine

## 2019-09-20 NOTE — Telephone Encounter (Signed)
Received phone call from Newman Nickels, [place] with [facility], stating that she had a phone visit and she is having increased SOB, insomnia and depression and she wanted [place] to call the patient and schedule an appointment for the pt to come in and talk to Dr. Dennard Schaumann about her complaints.   Called and spoke to pt about scheduling an apt and she states that she will have to call me back. informed her to call our front office and they will be happy to get that scheduled.

## 2019-09-26 ENCOUNTER — Ambulatory Visit (INDEPENDENT_AMBULATORY_CARE_PROVIDER_SITE_OTHER): Payer: Medicare Other | Admitting: Family Medicine

## 2019-09-26 ENCOUNTER — Other Ambulatory Visit: Payer: Self-pay

## 2019-09-26 ENCOUNTER — Encounter: Payer: Self-pay | Admitting: Family Medicine

## 2019-09-26 VITALS — BP 112/70 | HR 70 | Temp 97.6°F | Resp 22 | Ht 65.0 in | Wt 246.0 lb

## 2019-09-26 DIAGNOSIS — R0902 Hypoxemia: Secondary | ICD-10-CM | POA: Diagnosis not present

## 2019-09-26 DIAGNOSIS — R0609 Other forms of dyspnea: Secondary | ICD-10-CM

## 2019-09-26 DIAGNOSIS — R0600 Dyspnea, unspecified: Secondary | ICD-10-CM

## 2019-09-26 DIAGNOSIS — F339 Major depressive disorder, recurrent, unspecified: Secondary | ICD-10-CM

## 2019-09-26 DIAGNOSIS — S39012A Strain of muscle, fascia and tendon of lower back, initial encounter: Secondary | ICD-10-CM | POA: Diagnosis not present

## 2019-09-26 MED ORDER — VENLAFAXINE HCL ER 75 MG PO CP24: 75.0000 mg | ORAL_CAPSULE | Freq: Every day | ORAL | 3 refills | Status: DC

## 2019-09-26 NOTE — Progress Notes (Signed)
Subjective:    Patient ID: Virginia Walters, female    DOB: 31-Jan-1941, 79 y.o.   MRN: [id number]  HPI 07/30/18 Patient recently was admitted to the hospital with acute shortness of breath.  She was found to have a hemoglobin of 5.3!.  She received 2 units of blood and her hemoglobin at discharge was 7.8.  EGD was performed and revealed gastric ulcers and erosions that were thought to be the source of the bleeding.  Presents today with her family member due to altered mental status.  Over the weekend, the patient left her home in the afternoon wearing nothing but her night gown and diaper.  She walked to a neighbor's house.  She could not get them at the door and therefore she sat in their car wrapped up in their coats until someone came home.  She was too confused to go back to her own house that she had just walked from.  They called her family member who came and got her.  Today the patient seems very lethargic.  Her reaction time is extremely slow.  I performed a quick Mini-Mental status exam.  She is able to correctly tell me the date.  However she can only remember 2 out of 3 objects on recall.  She cannot perform serial sevens.  On a clock drawing, she draws a circle.  She draws the numbers outside the circle and incorrectly spaced.  She cannot even draw hands on a clock.  She has a difficult time even answering my questions.  She has increased respiratory rate.  Pulse oximetry on room air is 89%.  On pulmonary exam, both lungs are congested with pronounced bibasilar crackles concerning for possible bilateral pneumonia.  At that time, my plan was: Altered mental status, recent hospital admission for an upper GI bleed and profound anemia, worsening confusion, bibasilar crackles, borderline hypoxemia with a room pulse oximetry of [id number]   Patient is certainly not at her baseline.  She is very confused.  I am concerned about delirium possibly due to hospital-acquired pneumonia versus a urinary tract  infection.  She is unable to provide a urine sample here at the clinic today.  However given her confusion, the pronounced bibasilar crackles, the borderline hypoxemia, I believe the patient requires emergency room evaluation for possible pneumonia.  I would recommend chest x-ray, urinalysis, and blood work to rule out worsening anemia.  I recommended that she go to the emergency room and will notify the emergency room of her impending arrival.   08/03/18 Was admitted to the hospital after the last visit.  I have copied the discharge summary below for reference: DATE OF ADMISSION:  07/30/2018  ADMITTING PHYSICIAN: Saundra Shelling, MD  DATE OF DISCHARGE: 12/202/2019  PRIMARY CARE PHYSICIAN: Susy Frizzle, MD   ADMISSION DIAGNOSIS:  Acute respiratory failure with hypoxia ([place]) [J96.01] Acute kidney injury superimposed on chronic kidney disease ([place]) [N17.9, N18.9] Acute on chronic congestive heart failure, unspecified heart failure type ([place]) [I50.9] Hypokalemia DISCHARGE DIAGNOSIS:  Acute respiratory failure with hypoxia Acute on chronic systolic heart failure exacerbation Acute COPD exacerbation Hypokalemia Hypertension GERD SECONDARY DIAGNOSIS:       Past Medical History:  Diagnosis Date  . Dyslipidemia   . Frequent falls   . GERD (gastroesophageal reflux disease)   . HTN (hypertension)   . Memory loss   . PFO (patent foramen ovale)   . Retinal artery occlusion   . Weakness      ADMITTING HISTORY [name] [id number]  y.o.femalewith a known history of hyperlipidemia, chronic systolic heart failure, PFO,retinal artery occlusion GERDpresented to the emergency room for shortness of breath.Patient's oxygen saturation was around 76%.She was put on BiPAP and stabilized.Initially she was given a round of IV Solu-Medrol and nebulization treatments.No history of any COPD on the old reviewed documented chart.Patient appears congested and has pedal  edema.IV Lasix has been ordered and being given in the emergency room.No complaints of chest pain.Oxygen saturation improved after loading on BiPAP.Hospitalist service was consulted for further care.   HOSPITAL COURSE:  Was admitted to stepdown unit for respiratory distress and hypoxia on BiPAP.  Patient was diuresed with IV Lasix and echocardiogram was done and cardiology evaluation.  Potassium was aggressively supplemented during the hospitalization.  Patient was weaned off BiPAP and transition to oxygen by nasal cannula.  Patient continued beta-blocker, ACE inhibitor and Lasix for heart failure along with electrolyte supplementation.  Patient received physical therapy.  She was offered home health services at the time of discharge.  Patient also received IV Solu-Medrol and aggressive nebulization treatments for COPD exacerbation.  She was transitioned to oral steroids.  Was followed by pulmonary attending and cardiology attending during hospitalization.  Repeat echo showed EF of 45%.  MRSA PCR was negative during the work-up in the hospital .Patient will be discharged home follow-up with cardiology in the clinic along with primary care physician.  08/10/18 Patient is here today for follow-up.  She is accompanied by her family member.  She is currently on oxygen.  Now that she is on oxygen, she is mentating closer to her baseline.  Her family member states that she is much better than she was.  However we still do not have a good explanation as to why the patient was so hypoxic to begin with.  Certainly she has systolic failure with an ejection fraction of 45% and diffuse hypokinesis.  However her oxygen was out of proportion to what one would expect with this particular given a chest x-ray without pulmonary edema.  Is on appropriate medications with metoprolol and lisinopril.  Family is not certain if the patient has a follow-up appointment to see the cardiologist for a stress test and to  follow-up her systolic failure.  She was diagnosed with COPD in the hospital.  However I performed pulmonary function test today in my office.  The patient's FEV1 was 1.27 L which is 65% of predicted.  Her FVC was 1.68 L which was 62% of predicted her FEV1 to FVC ratio was 76%.  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  She does have a remote history of smoking however she has not smoked in years and has not been diagnosed with COPD to her knowledge in the past.  She has not seen a pulmonologist prior to this.  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  However I believe that most recently she was experiencing delirium on top of this secondary to severe hypoxia.  My concern is that we have still not isolated the cause of the hypoxia.  Certainly her anemia could be playing a role.  I will repeat her CBC to ensure that her anemia is improving.  At the hospital on the 16th it was 9.8 which is improved from 7.8 previously.  It is also possible that her congestive heart failure was playing a role however her ejection fraction was only  45%.  She is on appropriate medication.  I will ensure adequate follow-up with cardiology as the patient may benefit from a stress test given the diffuse hypokinesis seen on the EKG.  However I am concerned that there is an underlying pulmonary issue also contributing to the hypoxia particular given the restrictive pattern seen on her pulmonary function test and the abnormal breath sounds she had previously.  Therefore I will obtain a CT scan of the chest with contrast to evaluate for any evidence of pulmonary fibrosis, etc.  Also when to rule out a pulmonary embolism given her hypoxia and recent hospitalization.  09/13/18 CT revealed: IMPRESSION: 1. No specific findings identified to suggest interstitial lung disease. 2. Diminished lung  volumes with subsegmental atelectasis and dependent changes in the posterior lung bases. 3. Increase caliber of the main pulmonary artery which may be secondary to PA hypertension. 4. Cardiac enlargement, aortic atherosclerosis, and multi vessel coronary artery atherosclerotic calcifications. Aortic Atherosclerosis (ICD10-I70.0). 5. Scattered small solid pulmonary nodules measuring less than 5 mm. No follow-up needed if patient is low-risk (and has no known or suspected primary neoplasm). Non-contrast chest CT can be considered in 12 months if patient is high-risk. This recommendation follows the consensus statement: Guidelines for Management of Incidental Pulmonary Nodules Detected on CT Images: From the Fleischner Society 2017; Radiology 2017; 284:228-243. 6. Part solid nodule identified within the left upper lobe. Follow-up non-contrast CT recommended at 3-6 months to confirm persistence. If unchanged, and solid component remains <6 mm, annual CT is recommended until 5 years of stability has been established. If persistent these nodules should be considered highly suspicious if the solid component of the nodule is 6 mm or greater in size and enlarging. This recommendation follows the consensus statement: Guidelines for Management of Incidental Pulmonary Nodules Detected on CT Images:From the Fleischner Society 2017; published online before print (10.1148/radiol.IJ:2314499).  09/13/18 Patient did not see the cardiologist.  They canceled that appointment.  She is still on oxygen.  Her family member state that when she is wearing her oxygen, her mentation is better, her ability to ambulate is better.  Her functional status is better.  However she frequently takes her oxygen off at night when she is sleeping by accident and therefore in the mornings, she can sometimes be confused and short of breath.  I took the patient's oxygen off.  She is 93% resting on room air.  With ambulation, she  drops to 88% and only 60 feet.  At rest, her lungs are clear to auscultation bilaterally however after walking 60 feet, she began to audibly wheeze and demonstrate rhonchorous breath sounds suggesting obstructive lung disease.  She states that she does have a history of asthma in the past.  I also performed a Mini-Mental status exam today.  The patient is able to remember 3 out of 3 objects on recall.  She can spell world in reverse.  She has a difficult time performing serial sevens however.  Patient's family is concerned that she may be drinking as well at home.  She has a past history of alcohol use.  At that time, my plan was: CT of the lungs did not reveal any specific cause of her hypoxia.  Pulmonary function test have been unrevealing however her exam today suggest reversible obstruction.  Therefore I am going to empirically try the patient on Symbicort 160/4.5, 2 puffs inhaled twice daily and reassess the patient in 2 to 3 weeks to see if her breathing has improved.  I encouraged the patient to follow-up as planned with cardiology.  Given her hypoxia, she may benefit from a stress test to rule out underlying ischemia as a potential cause of her shortness of breath however I suspect COPD is more likely the cause.  I also believe the patient has mild underlying dementia.  I have recommended having someone stay with her at night to avoid unnecessary falls and also because I am worried about sundowning potentially causing her to take off her oxygen.  We discussed potentially moving to an assisted living facility and the patient is completely resistant to this.  09/28/18 Patient denies seeing any substantial benefit since starting Symbicort.  They have also not contacted the cardiologist as far as aging outpatient cardiology evaluation given her recent admission with pulmonary edema and her ejection fraction of 45% with diffuse hypokinesis.  However I took the patient's oxygen off today.  She maintain oxygen  saturations of 94% sitting.  We ambulated around the office and the patient maintained an oxygen saturation between 91 and 94% and even after 100 feet she was still 94% despite not taking or wearing her oxygen.  Objectively this seems to show an improvement in her respiratory function which I would attribute to the Symbicort.  Her lungs are much clear to auscultation today.  There are no crackles or wheezes or rails.  Her family states that they feel like she is doing better since starting the inhaler.  At that time, my plan was: I am still not certain as to the cause of her hypoxia.  Patient did have pulmonary edema however her echocardiogram was not impressive.  I did recommend outpatient cardiology evaluation to determine if the patient requires a stress test to evaluate for any evidence of coronary artery disease attributing to her cardiomyopathy.  However objectively, the patient seemed to improve on Symbicort and is no longer hypoxic with ambulation.  Therefore I will discontinue the Symbicort and try to maximize therapy by switching the patient to Stiolto 2 inhalations a day.  I will recheck the patient in 2 weeks to see how she is doing.  Hopefully at that time we can discontinue her oxygen.  Despite results of her pulmonary function test, objectively she seems to be responding to bronchodilator therapy suggesting perhaps a component of COPD playing a role in her hypoxia   10/12/18 Patient states that she feels worse since starting Stiolto.  She states that she gets choked on the medication when she uses it.  It makes her cough.  She felt like the Symbicort was helping her breathing more.  She is 93% on room air without her oxygen.  We ambulated 100 feet today in our clinic and her oxygen saturations maintained at 94%.  Her lungs are much clear to auscultation bilaterally.  There are no wheezes or crackles or rails.  She denies any shortness of breath although she is very winded after walking 100 feet  primarily due to deconditioning.  She is having to walk with a cane due to deconditioning.  She recently saw the cardiologist who is recommended a stress test.  At that time, my plan was: atient's hypoxia has improved.  She no longer requires oxygen.  I believe this is due to a combination of COPD as the patient did clinically respond to Symbicort as well as underlying CHF.  I will defer management of her underlying CHF to her cardiologist however I have recommended that we resume Symbicort 160/4.5, 2 puffs inhaled twice daily  as management for her COPD as this seemingly symptomatically improved her shortness of breath and also helped her hypoxia.  We will discontinue her oxygen.  Discontinue Stiolto.  Await the results of her stress test with her cardiologist  09/26/19 Patient saw cardiology who performed a stress test for heart.  Calculated ejection fraction was greater than 60%.  There was no evidence of ischemia.  Patient has not been seen since.  She presents today with 3 concerns.  First she reports severe shortness of breath.  She states that she cannot walk more than a few steps without becoming extremely winded.  She is 99% on room air.  However we had the patient ambulate around the office.  After walking only 60 feet her oxygen level dropped to 93%.  She was having severe low back pain and begged [place] to stop walking.  However her oxygen dropped to 88% on room air after walking 120 feet.  We then reapplied oxygen at 2 L via nasal cannula and the patient felt much better in the room and her oxygen level improved to 99% on 2 L.  She states that she has been compliant with her Symbicort.  She still uses this twice a day.  On pulmonary exam today there is no pulmonary edema appreciated.  There is no significant leg swelling.  She is in normal sinus rhythm and her heart rate is controlled.  She denies any chest pain.  There is no obvious wheezing.  I believe she has likely chronic multifactorial hypoxia due  to obesity hypoventilation, COPD, and likely diastolic dysfunction.  I believe all of these factors cause her to have hypoxia contributing to her dyspnea on exertion.  This is keeping her from basically been able to perform ADLs around her home.  She also reports worsening depression despite taking Lexapro 10 mg a day.  She reports feeling tired all the time.  She reports anhedonia.  She denies any suicidal ideation.  She also complains of severe low back pain.  The pain is located at the level of L5 just to the right of the spine.  She is tender to palpation at the spinous process of L5 and in the paraspinal musculature at that area.  Past Medical History:  Diagnosis Date  . CHF (congestive heart failure) ([place])   . Dyslipidemia   . Frequent falls   . GERD (gastroesophageal reflux disease)   . HTN (hypertension)   . Memory loss   . PFO (patent foramen ovale)   . Retinal artery occlusion   . Weakness    Past Surgical History:  Procedure Laterality Date  . ANKLE ARTHROSCOPY WITH OPEN REDUCTION INTERNAL FIXATION (ORIF) Right   . APPENDECTOMY    . COLONOSCOPY N/A 07/21/2018   Procedure: COLONOSCOPY;  Surgeon: Lin Landsman, MD;  Location: [facility] ENDOSCOPY;  Service: Gastroenterology;  Laterality: N/A;  . ESOPHAGOGASTRODUODENOSCOPY N/A 07/21/2018   Procedure: ESOPHAGOGASTRODUODENOSCOPY (EGD);  Surgeon: Lin Landsman, MD;  Location: [facility] ENDOSCOPY;  Service: Gastroenterology;  Laterality: N/A;  . TUBAL LIGATION     Current Outpatient Medications on File Prior to Visit  Medication Sig Dispense Refill  . B Complex-Biotin-FA (B-COMPLEX PO) Take 1 capsule by mouth daily.     . Calcium Carbonate-Vitamin D (CALCIUM + D PO) Take 1 capsule by mouth 2 (two) times daily.     . Cholecalciferol (VITAMIN D) 2000 UNITS CAPS Take 1 capsule by mouth daily.     . dorzolamide (TRUSOPT) 2 % ophthalmic solution Place  1 drop into the left eye 2 (two) times daily.     Marland Kitchen escitalopram (LEXAPRO) 10 MG tablet  TAKE 1 TABLET BY MOUTH  DAILY 90 tablet 3  . fish oil-omega-3 fatty acids 1000 MG capsule Take 2 g by mouth 2 (two) times daily.      . hydrochlorothiazide (HYDRODIURIL) 25 MG tablet Take 1 tablet (25 mg total) by mouth daily. 90 tablet 3  . latanoprost (XALATAN) 0.005 % ophthalmic solution Place 1 drop into the left eye at bedtime.     Marland Kitchen MELATONIN PO Take 5 mg by mouth at bedtime.     . mometasone (ELOCON) 0.1 % ointment Apply topically daily. 45 g 1  . Multiple Vitamin (MULTIVITAMIN) tablet Take 1 tablet by mouth daily.      . potassium chloride (K-DUR) 10 MEQ tablet Take 1 tablet (10 mEq total) by mouth daily. 90 tablet 3  . pravastatin (PRAVACHOL) 40 MG tablet TAKE 1 TABLET BY MOUTH  DAILY 90 tablet 3  . SYMBICORT 160-4.5 MCG/ACT inhaler INHALE 2 PUFFS INTO THE LUNGS 2 TIMES DAILY 10.2 g 5  . traZODone (DESYREL) 50 MG tablet TAKE 1 TABLET BY MOUTH AT  BEDTIME 90 tablet 3  . TURMERIC PO Take 1 capsule by mouth daily.    . verapamil (CALAN-SR) 240 MG CR tablet TAKE 1 TABLET BY MOUTH AT  BEDTIME 90 tablet 3  . vitamin B-12 (CYANOCOBALAMIN) 1000 MCG tablet Take 1,000 mcg by mouth daily.    Marland Kitchen VITAMIN E PO Take 180 mg by mouth daily.     Marland Kitchen lisinopril (ZESTRIL) 40 MG tablet Take 1 tablet (40 mg total) by mouth daily. 90 tablet 3  . pantoprazole (PROTONIX) 40 MG tablet TAKE 1 TABLET BY MOUTH TWICE (2) DAILY (Patient not taking: Reported on 01/08/2019) 180 tablet 3   No current facility-administered medications on file prior to visit.   Allergies  Allergen Reactions  . Oxycodone Itching   Social History   Socioeconomic History  . Marital status: Widowed    Spouse name: Not on file  . Number of children: 1  . Years of education: HS  . Highest education level: Not on file  Occupational History  . Occupation: Retired  Tobacco Use  . Smoking status: Former Smoker    Packs/day: 1.00    Years: 15.00    Pack years: 15.00    Types: Cigarettes    Quit date: 12/27/1980    Years since  quitting: 38.7  . Smokeless tobacco: Never Used  Substance and Sexual Activity  . Alcohol use: Yes    Comment: occassionally  . Drug use: No  . Sexual activity: Not Currently  Other Topics Concern  . Not on file  Social History Narrative   Lives at home alone.   Right-handed.   No daily use of caffeine.   Social Determinants of Health   Financial Resource Strain:   . Difficulty of Paying Living Expenses: Not on file  Food Insecurity:   . Worried About Charity fundraiser in the Last Year: Not on file  . Ran Out of Food in the Last Year: Not on file  Transportation Needs:   . Lack of Transportation (Medical): Not on file  . Lack of Transportation (Non-Medical): Not on file  Physical Activity:   . Days of Exercise per Week: Not on file  . Minutes of Exercise per Session: Not on file  Stress:   . Feeling of Stress : Not on file  Social  Connections:   . Frequency of Communication with Friends and Family: Not on file  . Frequency of Social Gatherings with Friends and Family: Not on file  . Attends Religious Services: Not on file  . Active Member of Clubs or Organizations: Not on file  . Attends Archivist Meetings: Not on file  . Marital Status: Not on file  Intimate Partner Violence:   . Fear of Current or Ex-Partner: Not on file  . Emotionally Abused: Not on file  . Physically Abused: Not on file  . Sexually Abused: Not on file     Review of Systems  Musculoskeletal: Positive for back pain.  Psychiatric/Behavioral: Positive for depression.  All other systems reviewed and are negative.      Objective:   Physical Exam Constitutional:      General: She is not in acute distress.    Appearance: She is not ill-appearing, toxic-appearing or diaphoretic.  Neck:     Vascular: No carotid bruit.  Cardiovascular:     Rate and Rhythm: Normal rate and regular rhythm.     Heart sounds: Normal heart sounds. No murmur.  Pulmonary:     Effort: Pulmonary effort is  normal.     Breath sounds: No wheezing, rhonchi or rales.  Musculoskeletal:     Lumbar back: Tenderness and bony tenderness present. Decreased range of motion. Negative right straight leg raise test and negative left straight leg raise test.       Back:     Right lower leg: No edema.     Left lower leg: No edema.  Neurological:     General: No focal deficit present.     Mental Status: She is alert. Mental status is at baseline.     Cranial Nerves: Cranial nerves are intact.     Sensory: Sensation is intact.     Coordination: Coordination is intact.     Gait: Gait is intact.           Assessment & Plan:  Strain of lumbar region, initial encounter - Plan: DG Lumbar Spine Complete  Hypoxia  Dyspnea on exertion - Plan: CBC with Differential/Platelet, Brain natriuretic peptide, COMPLETE METABOLIC PANEL WITH GFR, D-dimer, quantitative (not at [facility])  Depression, recurrent ([place])  Patient has significant pain in her lower back.  Begin by obtaining an x-ray of her L-spine to rule out vertebral fracture given her age and frailty.  Her depression is worsening.  Discontinue Lexapro and replace with venlafaxine extended release 75 mg p.o. every morning.  I believe the patient has multifactorial dyspnea on exertion and hypoxia.  I believe is due to a combination of deconditioning, obesity hypoventilation, COPD, diastolic dysfunction.  However her hypoxia is keeping her from being able to perform her ADLs.  I will try to get the patient approved for oxygen 2 L via nasal cannula at home with activity which I believe will improve her quality of life which would only help her depression.  I will also check lab work to rule out anemia, elevated BNP, or positive D-dimer which may require additional work-up.  Would not need to do as far as to get oxygen and send it to you were

## 2019-09-27 ENCOUNTER — Other Ambulatory Visit: Payer: Self-pay | Admitting: Family Medicine

## 2019-09-27 ENCOUNTER — Ambulatory Visit
Admission: RE | Admit: 2019-09-27 | Discharge: 2019-09-27 | Disposition: A | Discharge status: Home / Self Care | Payer: Medicare Other | Source: Ambulatory Visit | Attending: Family Medicine | Admitting: Family Medicine

## 2019-09-27 ENCOUNTER — Ambulatory Visit (HOSPITAL_COMMUNITY)
Admission: RE | Admit: 2019-09-27 | Discharge: 2019-09-27 | Disposition: A | Discharge status: Home / Self Care | Payer: Medicare Other | Source: Ambulatory Visit | Attending: Family Medicine | Admitting: Family Medicine

## 2019-09-27 DIAGNOSIS — R0602 Shortness of breath: Secondary | ICD-10-CM

## 2019-09-27 DIAGNOSIS — R0902 Hypoxemia: Secondary | ICD-10-CM

## 2019-09-27 DIAGNOSIS — S39012A Strain of muscle, fascia and tendon of lower back, initial encounter: Secondary | ICD-10-CM

## 2019-09-27 LAB — COMPLETE METABOLIC PANEL WITH GFR
AG Ratio: 1.7 (calc) (ref 1.0–2.5)
ALT: 15 U/L (ref 6–29)
AST: 14 U/L (ref 10–35)
Albumin: 4.3 g/dL (ref 3.6–5.1)
Alkaline phosphatase (APISO): 42 U/L (ref 37–153)
BUN/Creatinine Ratio: 22 (calc) (ref 6–22)
BUN: 38 mg/dL — ABNORMAL HIGH (ref 7–25)
CO2: 24 mmol/L (ref 20–32)
Calcium: 10 mg/dL (ref 8.6–10.4)
Chloride: 107 mmol/L (ref 98–110)
Creat: 1.71 mg/dL — ABNORMAL HIGH (ref 0.60–0.93)
GFR, Est African American: 33 mL/min/{1.73_m2} — ABNORMAL LOW (ref >=60)
GFR, Est Non African American: 28 mL/min/{1.73_m2} — ABNORMAL LOW (ref >=60)
Globulin: 2.5 g/dL (calc) (ref 1.9–3.7)
Glucose, Bld: 117 mg/dL — ABNORMAL HIGH (ref 65–99)
Potassium: 4.7 mmol/L (ref 3.5–5.3)
Sodium: 141 mmol/L (ref 135–146)
Total Bilirubin: 0.3 mg/dL (ref 0.2–1.2)
Total Protein: 6.8 g/dL (ref 6.1–8.1)

## 2019-09-27 LAB — CBC WITH DIFFERENTIAL/PLATELET
Absolute Monocytes: 673 cells/uL (ref 200–950)
Basophils Absolute: 67 cells/uL (ref 0–200)
Basophils Relative: 0.9 %
Eosinophils Absolute: 429 cells/uL (ref 15–500)
Eosinophils Relative: 5.8 %
HCT: 33.7 % — ABNORMAL LOW (ref 35.0–45.0)
Hemoglobin: 10.4 g/dL — ABNORMAL LOW (ref 11.7–15.5)
Lymphs Abs: 2220 cells/uL (ref 850–3900)
MCH: 24.1 pg — ABNORMAL LOW (ref 27.0–33.0)
MCHC: 30.9 g/dL — ABNORMAL LOW (ref 32.0–36.0)
MCV: 78.2 fL — ABNORMAL LOW (ref 80.0–100.0)
MPV: 9.4 fL (ref 7.5–12.5)
Monocytes Relative: 9.1 %
Neutro Abs: 4011 cells/uL (ref 1500–7800)
Neutrophils Relative %: 54.2 %
Platelets: 280 10*3/uL (ref 140–400)
RBC: 4.31 10*6/uL (ref 3.80–5.10)
RDW: 15.3 % — ABNORMAL HIGH (ref 11.0–15.0)
Total Lymphocyte: 30 %
WBC: 7.4 10*3/uL (ref 3.8–10.8)

## 2019-09-27 LAB — BRAIN NATRIURETIC PEPTIDE: Brain Natriuretic Peptide: 14 pg/mL (ref <100)

## 2019-09-27 LAB — D-DIMER, QUANTITATIVE: D-Dimer, Quant: 2.29 mcg/mL FEU — ABNORMAL HIGH (ref <0.50)

## 2019-09-27 MED ORDER — TECHNETIUM TO 99M ALBUMIN AGGREGATED
1.3400 | Freq: Once | INTRAVENOUS | Status: AC
  Administered 2019-09-27: 14:00:00 1.4 via INTRAVENOUS

## 2019-09-30 DIAGNOSIS — J449 Chronic obstructive pulmonary disease, unspecified: Secondary | ICD-10-CM | POA: Diagnosis not present

## 2019-10-24 DIAGNOSIS — H401122 Primary open-angle glaucoma, left eye, moderate stage: Secondary | ICD-10-CM | POA: Diagnosis not present

## 2019-10-24 DIAGNOSIS — H401113 Primary open-angle glaucoma, right eye, severe stage: Secondary | ICD-10-CM | POA: Diagnosis not present

## 2019-10-28 DIAGNOSIS — J449 Chronic obstructive pulmonary disease, unspecified: Secondary | ICD-10-CM | POA: Diagnosis not present

## 2019-11-10 ENCOUNTER — Other Ambulatory Visit: Payer: Self-pay | Admitting: Family Medicine

## 2019-11-28 DIAGNOSIS — J449 Chronic obstructive pulmonary disease, unspecified: Secondary | ICD-10-CM | POA: Diagnosis not present

## 2019-12-09 ENCOUNTER — Ambulatory Visit (INDEPENDENT_AMBULATORY_CARE_PROVIDER_SITE_OTHER): Payer: Medicare Other | Admitting: Family Medicine

## 2019-12-09 ENCOUNTER — Encounter: Payer: Self-pay | Admitting: Family Medicine

## 2019-12-09 ENCOUNTER — Other Ambulatory Visit: Payer: Self-pay

## 2019-12-09 VITALS — BP 122/70 | HR 88 | Temp 97.2°F | Resp 16 | Ht 65.0 in | Wt 252.0 lb

## 2019-12-09 DIAGNOSIS — M5431 Sciatica, right side: Secondary | ICD-10-CM | POA: Diagnosis not present

## 2019-12-09 MED ORDER — PREDNISONE 20 MG PO TABS: ORAL_TABLET | ORAL | 0 refills | Status: DC

## 2019-12-09 NOTE — Progress Notes (Signed)
Subjective:    Patient ID: Virginia Walters, female    DOB: 11-12-1940, 79 y.o.   MRN: [id number]  HPI Below, I have copied the results of an MRI of the lumbar spine from December 2017:  IMPRESSION: 1. Acute to subacute L4 anterior and right lateral superior endplate fracture. Mild endplate compression with no significant loss of L4 vertebral body height. 2. Edema in the right L3-L4 facets and right L3 lamina could be due to altered biomechanical stress or related to an associated nondisplaced posterior element fracture. 3. Subtle acute or subacute right L1 superior endplate fracture also suspected, no loss of vertebral body height. 4. Chronic appearing T12 compression fracture; anterior T12 endplate marrow edema is felt to be degenerative in nature. 5. 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 Moderate right neural foraminal stenosis at both levels.   Patient presents today complaining of low back pain that radiates into her posterior right gluteus.  She also complains of numbness and aching pain radiating down her right leg all the way into her toes.  She states that when she sits for a prolonged period of time or at night when she is lying in bed, often her leg will go numb and she will have an aching pain nerve like pain radiate up and down her leg.  She complains of constant pain in her posterior right hip.  She denies any recent falls.  She denies any bowel or bladder incontinence.  She denies any saddle anesthesia.  She denies any fever or chills. Past Medical History:  Diagnosis Date  . CHF (congestive heart failure) ([place])   . Dyslipidemia   . Frequent falls   . GERD (gastroesophageal reflux disease)   . HTN (hypertension)   . Memory loss   . PFO (patent foramen ovale)   . Retinal artery occlusion   . Weakness    Past Surgical History:  Procedure Laterality Date  .  ANKLE ARTHROSCOPY WITH OPEN REDUCTION INTERNAL FIXATION (ORIF) Right   . APPENDECTOMY    . COLONOSCOPY N/A 07/21/2018   Procedure: COLONOSCOPY;  Surgeon: Lin Landsman, MD;  Location[facility] ENDOSCOPY;  Service: Gastroenterology;  Laterality: N/A;  . ESOPHAGOGASTRODUODENOSCOPY N/A 07/21/2018   Procedure: ESOPHAGOGASTRODUODENOSCOPY (EGD);  Surgeon: Lin Landsman, MD;  Location: [facility] ENDOSCOPY;  Service: Gastroenterology;  Laterality: N/A;  . TUBAL LIGATION     Current Outpatient Medications on File Prior to Visit  Medication Sig Dispense Refill  . B Complex-Biotin-FA (B-COMPLEX PO) Take 1 capsule by mouth daily.     . Calcium Carbonate-Vitamin D (CALCIUM + D PO) Take 1 capsule by mouth 2 (two) times daily.     . Cholecalciferol (VITAMIN D) 2000 UNITS CAPS Take 1 capsule by mouth daily.     . dorzolamide (TRUSOPT) 2 % ophthalmic solution Place 1 drop into the left eye 2 (two) times daily.     Marland Kitchen escitalopram (LEXAPRO) 10 MG tablet TAKE 1 TABLET BY MOUTH  DAILY 90 tablet 3  . fish oil-omega-3 fatty acids 1000 MG capsule Take 2 g by mouth 2 (two) times daily.      . hydrochlorothiazide (HYDRODIURIL) 25 MG tablet Take 1 tablet (25 mg total) by mouth daily. 90 tablet 3  . latanoprost (XALATAN) 0.005 % ophthalmic solution Place 1 drop into the left eye at bedtime.     Marland Kitchen MELATONIN PO Take 5 mg by mouth at bedtime.     Marland Kitchen  Multiple Vitamin (MULTIVITAMIN) tablet Take 1 tablet by mouth daily.      . pantoprazole (PROTONIX) 40 MG tablet TAKE 1 TABLET BY MOUTH TWICE (2) DAILY 180 tablet 3  . pravastatin (PRAVACHOL) 40 MG tablet TAKE 1 TABLET BY MOUTH  DAILY 90 tablet 3  . SYMBICORT 160-4.5 MCG/ACT inhaler INHALE 2 PUFFS INTO THE LUNGS 2 TIMES DAILY 10.2 g 5  . traZODone (DESYREL) 50 MG tablet TAKE 1 TABLET BY MOUTH AT  BEDTIME 90 tablet 3  . TURMERIC PO Take 1 capsule by mouth daily.    Marland Kitchen venlafaxine XR (EFFEXOR XR) 75 MG 24 hr capsule Take 1 capsule (75 mg total) by mouth daily with breakfast. 30  capsule 3  . verapamil (CALAN-SR) 240 MG CR tablet TAKE 1 TABLET BY MOUTH AT  BEDTIME 90 tablet 3  . vitamin B-12 (CYANOCOBALAMIN) 1000 MCG tablet Take 1,000 mcg by mouth daily.    Marland Kitchen VITAMIN E PO Take 180 mg by mouth daily.     Marland Kitchen lisinopril (ZESTRIL) 40 MG tablet Take 1 tablet (40 mg total) by mouth daily. 90 tablet 3  . potassium chloride (K-DUR) 10 MEQ tablet Take 1 tablet (10 mEq total) by mouth daily. 90 tablet 3   No current facility-administered medications on file prior to visit.   Allergies  Allergen Reactions  . Oxycodone Itching   Social History   Socioeconomic History  . Marital status: Widowed    Spouse name: Not on file  . Number of children: 1  . Years of education: HS  . Highest education level: Not on file  Occupational History  . Occupation: Retired  Tobacco Use  . Smoking status: Former Smoker    Packs/day: 1.00    Years: 15.00    Pack years: 15.00    Types: Cigarettes    Quit date: 12/27/1980    Years since quitting: 38.9  . Smokeless tobacco: Never Used  Substance and Sexual Activity  . Alcohol use: Yes    Comment: occassionally  . Drug use: No  . Sexual activity: Not Currently  Other Topics Concern  . Not on file  Social History Narrative   Lives at home alone.   Right-handed.   No daily use of caffeine.   Social Determinants of Health   Financial Resource Strain:   . Difficulty of Paying Living Expenses:   Food Insecurity:   . Worried About Charity fundraiser in the Last Year:   . Arboriculturist in the Last Year:   Transportation Needs:   . Film/video editor (Medical):   Marland Kitchen Lack of Transportation (Non-Medical):   Physical Activity:   . Days of Exercise per Week:   . Minutes of Exercise per Session:   Stress:   . Feeling of Stress :   Social Connections:   . Frequency of Communication with Friends and Family:   . Frequency of Social Gatherings with Friends and Family:   . Attends Religious Services:   . Active Member of Clubs or  Organizations:   . Attends Archivist Meetings:   Marland Kitchen Marital Status:   Intimate Partner Violence:   . Fear of Current or Ex-Partner:   . Emotionally Abused:   Marland Kitchen Physically Abused:   . Sexually Abused:       Review of Systems  All other systems reviewed and are negative.      Objective:   Physical Exam Vitals reviewed.  Cardiovascular:     Rate and Rhythm: Normal  rate and regular rhythm.  Pulmonary:     Effort: Pulmonary effort is normal.     Breath sounds: Normal breath sounds.  Musculoskeletal:     Lumbar back: Tenderness and bony tenderness present. No swelling, deformity or signs of trauma. Decreased range of motion.       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  Begin by starting a prednisone taper pack and reassessing in 1 week to see for any improvement.  Patient experiences some improvement, we may consider starting her on gabapentin although I worry about gabapentin causing confusion and poor balance in this elderly patient.  Option #2 would be a referral for possible epidural steroid injections.  The pain has been present for several months.  However it has steadily been worsening over the last month or so to the point the patient presented today.

## 2019-12-24 ENCOUNTER — Other Ambulatory Visit: Payer: Self-pay | Admitting: Family Medicine

## 2019-12-28 DIAGNOSIS — J449 Chronic obstructive pulmonary disease, unspecified: Secondary | ICD-10-CM | POA: Diagnosis not present

## 2020-01-22 ENCOUNTER — Telehealth: Payer: Self-pay | Admitting: *Deleted

## 2020-01-22 NOTE — Telephone Encounter (Signed)
Received call from patient.   Reports that she is having increased SOB and feels like she is not getting enough O2. Reports that she did increased liters on O2, but still feels deprived. Of note, patient sounds very out of breath and is not speaking in whole sentences.   States that she can't lie down without becoming SOB. Reports that she does have increased edema to BLE.   Advised to go to ER for evaluation. States that she will contact family to help transport her.   Advised that she sounds extremely SOB and EMS should be contacted. Declined EMS services. States that family is very close and will take her to ER.

## 2020-01-22 NOTE — Telephone Encounter (Signed)
Agree pt needs ER, if SOB, hypoxia on home O2

## 2020-01-28 DIAGNOSIS — J449 Chronic obstructive pulmonary disease, unspecified: Secondary | ICD-10-CM | POA: Diagnosis not present

## 2020-01-30 ENCOUNTER — Other Ambulatory Visit: Payer: Self-pay

## 2020-01-30 MED ORDER — PRAVASTATIN SODIUM 40 MG PO TABS: 40.0000 mg | ORAL_TABLET | Freq: Every day | ORAL | 3 refills | Status: DC

## 2020-01-31 ENCOUNTER — Other Ambulatory Visit: Payer: Self-pay | Admitting: Family Medicine

## 2020-01-31 ENCOUNTER — Other Ambulatory Visit: Payer: Self-pay

## 2020-01-31 MED ORDER — VENLAFAXINE HCL ER 75 MG PO CP24: 75.0000 mg | ORAL_CAPSULE | Freq: Every day | ORAL | 1 refills | Status: DC

## 2020-01-31 MED ORDER — TRAZODONE HCL 50 MG PO TABS: 50.0000 mg | ORAL_TABLET | Freq: Every day | ORAL | 3 refills | Status: DC

## 2020-01-31 NOTE — Telephone Encounter (Signed)
Trazodone is ok.  Patient should not be taking lexapro AND Venlafaxine.  Lexapro was stopped and replaced with venlafaxine.  Please call patient and verify this with her (she has mild memory problems)

## 2020-02-03 ENCOUNTER — Other Ambulatory Visit: Payer: Self-pay | Admitting: Family Medicine

## 2020-02-03 NOTE — Telephone Encounter (Signed)
Virginia Walters, isnt getting enough oxygen, her daughter wants to know if it can be turned up from 2 liters to 4 liters?

## 2020-02-07 ENCOUNTER — Telehealth: Payer: Self-pay | Admitting: Family Medicine

## 2020-02-07 ENCOUNTER — Ambulatory Visit (INDEPENDENT_AMBULATORY_CARE_PROVIDER_SITE_OTHER): Payer: Medicare Other | Admitting: Family Medicine

## 2020-02-07 ENCOUNTER — Other Ambulatory Visit: Payer: Self-pay

## 2020-02-07 VITALS — BP 122/58 | HR 102 | Temp 98.6°F | Wt 225.0 lb

## 2020-02-07 DIAGNOSIS — R5382 Chronic fatigue, unspecified: Secondary | ICD-10-CM | POA: Diagnosis not present

## 2020-02-07 DIAGNOSIS — D649 Anemia, unspecified: Secondary | ICD-10-CM | POA: Diagnosis not present

## 2020-02-07 DIAGNOSIS — R6889 Other general symptoms and signs: Secondary | ICD-10-CM | POA: Diagnosis not present

## 2020-02-07 MED ORDER — VENLAFAXINE HCL ER 75 MG PO CP24: 75.0000 mg | ORAL_CAPSULE | Freq: Every day | ORAL | 1 refills | Status: DC

## 2020-02-07 NOTE — Progress Notes (Signed)
  Chronic Care Management   Outreach Note  02/07/2020 Name: Virginia Walters MRN: [id number] DOB: 05-11-1941  Referred by: Susy Frizzle, MD Reason for referral : No chief complaint on file.   An unsuccessful telephone outreach was attempted today. The patient was referred to the pharmacist for assistance with care management and care coordination.   Follow Up Plan:   Pax

## 2020-02-07 NOTE — Progress Notes (Signed)
Subjective:    Patient ID: Virginia Walters, female    DOB: March 31, 1941, 79 y.o.   MRN: [id number]  HPI Patient is a very pleasant 79 year old Caucasian female who presents today with her family member.  She has a history of mild dementia.  She presents with fatigue.  Recently her family member has had to increase her oxygen level to help her breathe.  However her oxygen saturation has consistently been 98% irregardless of her oxygen supplementation.  Today she is 98% on room air having not had any oxygen in more than 30 minutes.  Therefore I do not believe the oxygen is the reason that she is short of breath.  Rather than shortness of breath, the patient is actually endorsing fatigue and easy fatigability.  She is mildly tachycardic on exam today.  She also appears somewhat dehydrated.  Her conjunctiva are extremely pale as are her lips and skin.  She appears to be anemic.  She denies any chest pain.  She denies any cough.  She denies any abdominal pain.  She denies any melena or hematochezia.  She denies any epistaxis or bruising.  She denies any dysuria.  She denies any vomiting or diarrhea. Past Medical History:  Diagnosis Date  . CHF (congestive heart failure) ([place])   . Dyslipidemia   . Frequent falls   . GERD (gastroesophageal reflux disease)   . HTN (hypertension)   . Memory loss   . PFO (patent foramen ovale)   . Retinal artery occlusion   . Weakness    Past Surgical History:  Procedure Laterality Date  . ANKLE ARTHROSCOPY WITH OPEN REDUCTION INTERNAL FIXATION (ORIF) Right   . APPENDECTOMY    . COLONOSCOPY N/A 07/21/2018   Procedure: COLONOSCOPY;  Surgeon: Lin Landsman, MD;  Location: [facility] ENDOSCOPY;  Service: Gastroenterology;  Laterality: N/A;  . ESOPHAGOGASTRODUODENOSCOPY N/A 07/21/2018   Procedure: ESOPHAGOGASTRODUODENOSCOPY (EGD);  Surgeon: Lin Landsman, MD;  Location: [facility] ENDOSCOPY;  Service: Gastroenterology;  Laterality: N/A;  . TUBAL LIGATION     Current  Outpatient Medications on File Prior to Visit  Medication Sig Dispense Refill  . B Complex-Biotin-FA (B-COMPLEX PO) Take 1 capsule by mouth daily.     . Calcium Carbonate-Vitamin D (CALCIUM + D PO) Take 1 capsule by mouth 2 (two) times daily.     . Cholecalciferol (VITAMIN D) 2000 UNITS CAPS Take 1 capsule by mouth daily.     . dorzolamide (TRUSOPT) 2 % ophthalmic solution Place 1 drop into the left eye 2 (two) times daily.     . fish oil-omega-3 fatty acids 1000 MG capsule Take 2 g by mouth 2 (two) times daily.      . hydrochlorothiazide (HYDRODIURIL) 25 MG tablet Take 1 tablet (25 mg total) by mouth daily. 90 tablet 3  . latanoprost (XALATAN) 0.005 % ophthalmic solution Place 1 drop into the left eye at bedtime.     Marland Kitchen MELATONIN PO Take 5 mg by mouth at bedtime.     . Multiple Vitamin (MULTIVITAMIN) tablet Take 1 tablet by mouth daily.      . pantoprazole (PROTONIX) 40 MG tablet TAKE 1 TABLET BY MOUTH TWICE (2) DAILY 180 tablet 3  . potassium chloride (KLOR-CON) 10 MEQ tablet TAKE 1 TABLET BY MOUTH  DAILY 90 tablet 3  . pravastatin (PRAVACHOL) 40 MG tablet Take 1 tablet (40 mg total) by mouth daily. 90 tablet 3  . predniSONE (DELTASONE) 20 MG tablet 3 tabs poqday 1-2, 2 tabs poqday 3-4, 1  tab poqday 5-6 12 tablet 0  . SYMBICORT 160-4.5 MCG/ACT inhaler INHALE 2 PUFFS INTO THE LUNGS 2 TIMES DAILY 10.2 g 5  . traZODone (DESYREL) 50 MG tablet Take 1 tablet (50 mg total) by mouth at bedtime. 90 tablet 3  . TURMERIC PO Take 1 capsule by mouth daily.    . verapamil (CALAN-SR) 240 MG CR tablet TAKE 1 TABLET BY MOUTH AT  BEDTIME 90 tablet 3  . vitamin B-12 (CYANOCOBALAMIN) 1000 MCG tablet Take 1,000 mcg by mouth daily.    Marland Kitchen VITAMIN E PO Take 180 mg by mouth daily.     Marland Kitchen lisinopril (ZESTRIL) 40 MG tablet Take 1 tablet (40 mg total) by mouth daily. 90 tablet 3   No current facility-administered medications on file prior to visit.   Allergies  Allergen Reactions  . Oxycodone Itching   Social  History   Socioeconomic History  . Marital status: Widowed    Spouse name: Not on file  . Number of children: 1  . Years of education: HS  . Highest education level: Not on file  Occupational History  . Occupation: Retired  Tobacco Use  . Smoking status: Former Smoker    Packs/day: 1.00    Years: 15.00    Pack years: 15.00    Types: Cigarettes    Quit date: 12/27/1980    Years since quitting: 39.1  . Smokeless tobacco: Never Used  Substance and Sexual Activity  . Alcohol use: Yes    Comment: occassionally  . Drug use: No  . Sexual activity: Not Currently  Other Topics Concern  . Not on file  Social History Narrative   Lives at home alone.   Right-handed.   No daily use of caffeine.   Social Determinants of Health   Financial Resource Strain:   . Difficulty of Paying Living Expenses:   Food Insecurity:   . Worried About Charity fundraiser in the Last Year:   . Arboriculturist in the Last Year:   Transportation Needs:   . Film/video editor (Medical):   Marland Kitchen Lack of Transportation (Non-Medical):   Physical Activity:   . Days of Exercise per Week:   . Minutes of Exercise per Session:   Stress:   . Feeling of Stress :   Social Connections:   . Frequency of Communication with Friends and Family:   . Frequency of Social Gatherings with Friends and Family:   . Attends Religious Services:   . Active Member of Clubs or Organizations:   . Attends Archivist Meetings:   Marland Kitchen Marital Status:   Intimate Partner Violence:   . Fear of Current or Ex-Partner:   . Emotionally Abused:   Marland Kitchen Physically Abused:   . Sexually Abused:      Review of Systems  All other systems reviewed and are negative.      Objective:   Physical Exam Constitutional:      General: She is not in acute distress.    Appearance: She is not ill-appearing, toxic-appearing or diaphoretic.  HENT:     Mouth/Throat:     Mouth: Mucous membranes are dry.  Neck:     Vascular: No carotid  bruit.  Cardiovascular:     Rate and Rhythm: Regular rhythm. Tachycardia present.     Heart sounds: Normal heart sounds. No murmur heard.   Pulmonary:     Effort: Pulmonary effort is normal. No respiratory distress.     Breath sounds: No stridor. No  wheezing, rhonchi or rales.  Chest:     Chest wall: No tenderness.  Musculoskeletal:     Right lower leg: No edema.     Left lower leg: No edema.  Skin:    Coloration: Skin is pale.  Neurological:     General: No focal deficit present.     Mental Status: She is alert. Mental status is at baseline.     Cranial Nerves: Cranial nerves are intact.     Sensory: Sensation is intact.     Coordination: Coordination is intact.     Gait: Gait is intact.           Assessment & Plan:  Chronic fatigue - Plan: CBC with Differential/Platelet, COMPLETE METABOLIC PANEL WITH GFR, Vitamin B12, TSH, Iron  After our history and physical, I believe the patient is suffering more from severe fatigue and easy fatigability rather than shortness of breath.  She is dehydrated on exam.  She is mildly tachycardic.  She is also very pale.  I am concerned that the patient may be anemic.  I will check a CBC, CMP, B12, TSH, and iron level to evaluate further.  If she is severely anemic, she will likely need GI consultation to rule out GI sources of bleeding as well as I suspect iron deficiency anemia.  Await the results of the lab work to determine the next step in the work-up.

## 2020-02-08 LAB — CBC MORPHOLOGY

## 2020-02-08 LAB — COMPLETE METABOLIC PANEL WITH GFR
AG Ratio: 1.6 (calc) (ref 1.0–2.5)
ALT: 11 U/L (ref 6–29)
AST: 12 U/L (ref 10–35)
Albumin: 4.1 g/dL (ref 3.6–5.1)
Alkaline phosphatase (APISO): 50 U/L (ref 37–153)
BUN/Creatinine Ratio: 19 (calc) (ref 6–22)
BUN: 30 mg/dL — ABNORMAL HIGH (ref 7–25)
CO2: 27 mmol/L (ref 20–32)
Calcium: 10.6 mg/dL — ABNORMAL HIGH (ref 8.6–10.4)
Chloride: 103 mmol/L (ref 98–110)
Creat: 1.61 mg/dL — ABNORMAL HIGH (ref 0.60–0.93)
GFR, Est African American: 35 mL/min/{1.73_m2} — ABNORMAL LOW (ref >=60)
GFR, Est Non African American: 30 mL/min/{1.73_m2} — ABNORMAL LOW (ref >=60)
Globulin: 2.6 g/dL (calc) (ref 1.9–3.7)
Glucose, Bld: 120 mg/dL — ABNORMAL HIGH (ref 65–99)
Potassium: 4.4 mmol/L (ref 3.5–5.3)
Sodium: 142 mmol/L (ref 135–146)
Total Bilirubin: 0.3 mg/dL (ref 0.2–1.2)
Total Protein: 6.7 g/dL (ref 6.1–8.1)

## 2020-02-08 LAB — CBC WITH DIFFERENTIAL/PLATELET
Absolute Monocytes: 970 cells/uL — ABNORMAL HIGH (ref 200–950)
Basophils Absolute: 89 cells/uL (ref 0–200)
Basophils Relative: 1 %
Eosinophils Absolute: 543 cells/uL — ABNORMAL HIGH (ref 15–500)
Eosinophils Relative: 6.1 %
HCT: 22.7 % — ABNORMAL LOW (ref 35.0–45.0)
Hemoglobin: 6.1 g/dL — ABNORMAL LOW (ref 11.7–15.5)
Lymphs Abs: 2136 cells/uL (ref 850–3900)
MCH: 18.4 pg — ABNORMAL LOW (ref 27.0–33.0)
MCHC: 26.9 g/dL — ABNORMAL LOW (ref 32.0–36.0)
MCV: 68.4 fL — ABNORMAL LOW (ref 80.0–100.0)
MPV: 9.5 fL (ref 7.5–12.5)
Monocytes Relative: 10.9 %
Neutro Abs: 5162 cells/uL (ref 1500–7800)
Neutrophils Relative %: 58 %
Platelets: 458 10*3/uL — ABNORMAL HIGH (ref 140–400)
RBC: 3.32 10*6/uL — ABNORMAL LOW (ref 3.80–5.10)
RDW: 17 % — ABNORMAL HIGH (ref 11.0–15.0)
Total Lymphocyte: 24 %
WBC: 8.9 10*3/uL (ref 3.8–10.8)

## 2020-02-08 LAB — IRON: Iron: 19 ug/dL — ABNORMAL LOW (ref 45–160)

## 2020-02-08 LAB — TSH: TSH: 1.49 mIU/L (ref 0.40–4.50)

## 2020-02-08 LAB — VITAMIN B12: Vitamin B-12: >2000 pg/mL — ABNORMAL HIGH (ref 200–1100)

## 2020-02-10 ENCOUNTER — Observation Stay
Admission: EM | Admit: 2020-02-10 | Discharge: 2020-02-11 | Disposition: A | Discharge status: Home / Self Care | Payer: Medicare Other | Attending: Internal Medicine | Admitting: Internal Medicine

## 2020-02-10 ENCOUNTER — Other Ambulatory Visit: Payer: Self-pay

## 2020-02-10 DIAGNOSIS — I5022 Chronic systolic (congestive) heart failure: Secondary | ICD-10-CM | POA: Diagnosis present

## 2020-02-10 DIAGNOSIS — K31819 Angiodysplasia of stomach and duodenum without bleeding: Secondary | ICD-10-CM | POA: Insufficient documentation

## 2020-02-10 DIAGNOSIS — I130 Hypertensive heart and chronic kidney disease with heart failure and stage 1 through stage 4 chronic kidney disease, or unspecified chronic kidney disease: Secondary | ICD-10-CM | POA: Insufficient documentation

## 2020-02-10 DIAGNOSIS — N179 Acute kidney failure, unspecified: Secondary | ICD-10-CM

## 2020-02-10 DIAGNOSIS — Z9181 History of falling: Secondary | ICD-10-CM | POA: Diagnosis not present

## 2020-02-10 DIAGNOSIS — Z87891 Personal history of nicotine dependence: Secondary | ICD-10-CM | POA: Insufficient documentation

## 2020-02-10 DIAGNOSIS — D649 Anemia, unspecified: Secondary | ICD-10-CM | POA: Diagnosis not present

## 2020-02-10 DIAGNOSIS — Z20822 Contact with and (suspected) exposure to covid-19: Secondary | ICD-10-CM | POA: Diagnosis not present

## 2020-02-10 DIAGNOSIS — K259 Gastric ulcer, unspecified as acute or chronic, without hemorrhage or perforation: Secondary | ICD-10-CM | POA: Insufficient documentation

## 2020-02-10 DIAGNOSIS — N189 Chronic kidney disease, unspecified: Secondary | ICD-10-CM | POA: Diagnosis not present

## 2020-02-10 DIAGNOSIS — H5461 Unqualified visual loss, right eye, normal vision left eye: Secondary | ICD-10-CM | POA: Diagnosis not present

## 2020-02-10 DIAGNOSIS — J4489 Other specified chronic obstructive pulmonary disease: Secondary | ICD-10-CM

## 2020-02-10 DIAGNOSIS — I69398 Other sequelae of cerebral infarction: Secondary | ICD-10-CM | POA: Insufficient documentation

## 2020-02-10 DIAGNOSIS — Z79899 Other long term (current) drug therapy: Secondary | ICD-10-CM | POA: Insufficient documentation

## 2020-02-10 DIAGNOSIS — J449 Chronic obstructive pulmonary disease, unspecified: Secondary | ICD-10-CM | POA: Diagnosis not present

## 2020-02-10 DIAGNOSIS — Z9981 Dependence on supplemental oxygen: Secondary | ICD-10-CM | POA: Insufficient documentation

## 2020-02-10 DIAGNOSIS — D509 Iron deficiency anemia, unspecified: Principal | ICD-10-CM | POA: Insufficient documentation

## 2020-02-10 DIAGNOSIS — F0390 Unspecified dementia without behavioral disturbance: Secondary | ICD-10-CM | POA: Insufficient documentation

## 2020-02-10 DIAGNOSIS — K449 Diaphragmatic hernia without obstruction or gangrene: Secondary | ICD-10-CM | POA: Diagnosis not present

## 2020-02-10 DIAGNOSIS — H409 Unspecified glaucoma: Secondary | ICD-10-CM | POA: Diagnosis not present

## 2020-02-10 DIAGNOSIS — I129 Hypertensive chronic kidney disease with stage 1 through stage 4 chronic kidney disease, or unspecified chronic kidney disease: Secondary | ICD-10-CM | POA: Diagnosis not present

## 2020-02-10 DIAGNOSIS — N1831 Chronic kidney disease, stage 3a: Secondary | ICD-10-CM | POA: Diagnosis not present

## 2020-02-10 DIAGNOSIS — E785 Hyperlipidemia, unspecified: Secondary | ICD-10-CM | POA: Diagnosis not present

## 2020-02-10 DIAGNOSIS — Z7951 Long term (current) use of inhaled steroids: Secondary | ICD-10-CM | POA: Diagnosis not present

## 2020-02-10 DIAGNOSIS — K219 Gastro-esophageal reflux disease without esophagitis: Secondary | ICD-10-CM | POA: Diagnosis present

## 2020-02-10 DIAGNOSIS — I10 Essential (primary) hypertension: Secondary | ICD-10-CM | POA: Diagnosis present

## 2020-02-10 LAB — CBC WITH DIFFERENTIAL/PLATELET
Abs Immature Granulocytes: 0.03 10*3/uL (ref 0.00–0.07)
Basophils Absolute: 0.1 10*3/uL (ref 0.0–0.1)
Basophils Relative: 1 %
Eosinophils Absolute: 0.4 10*3/uL (ref 0.0–0.5)
Eosinophils Relative: 5 %
HCT: 21.6 % — ABNORMAL LOW (ref 36.0–46.0)
Hemoglobin: 6 g/dL — ABNORMAL LOW (ref 12.0–15.0)
Immature Granulocytes: 0 %
Lymphocytes Relative: 22 %
Lymphs Abs: 1.9 10*3/uL (ref 0.7–4.0)
MCH: 18.7 pg — ABNORMAL LOW (ref 26.0–34.0)
MCHC: 27.8 g/dL — ABNORMAL LOW (ref 30.0–36.0)
MCV: 67.3 fL — ABNORMAL LOW (ref 80.0–100.0)
Monocytes Absolute: 0.9 10*3/uL (ref 0.1–1.0)
Monocytes Relative: 10 %
Neutro Abs: 5.4 10*3/uL (ref 1.7–7.7)
Neutrophils Relative %: 62 %
Platelets: 435 10*3/uL — ABNORMAL HIGH (ref 150–400)
RBC: 3.21 MIL/uL — ABNORMAL LOW (ref 3.87–5.11)
RDW: 18.2 % — ABNORMAL HIGH (ref 11.5–15.5)
WBC: 8.7 10*3/uL (ref 4.0–10.5)
nRBC: 0 % (ref 0.0–0.2)

## 2020-02-10 LAB — SARS CORONAVIRUS 2 BY RT PCR (HOSPITAL ORDER, PERFORMED IN ~~LOC~~ HOSPITAL LAB): SARS Coronavirus 2: NEGATIVE

## 2020-02-10 LAB — BASIC METABOLIC PANEL
Anion gap: 16 — ABNORMAL HIGH (ref 5–15)
BUN: 42 mg/dL — ABNORMAL HIGH (ref 8–23)
CO2: 21 mmol/L — ABNORMAL LOW (ref 22–32)
Calcium: 9.6 mg/dL (ref 8.9–10.3)
Chloride: 102 mmol/L (ref 98–111)
Creatinine, Ser: 2.13 mg/dL — ABNORMAL HIGH (ref 0.44–1.00)
GFR calc Af Amer: 25 mL/min — ABNORMAL LOW (ref >60)
GFR calc non Af Amer: 22 mL/min — ABNORMAL LOW (ref >60)
Glucose, Bld: 145 mg/dL — ABNORMAL HIGH (ref 70–99)
Potassium: 3.8 mmol/L (ref 3.5–5.1)
Sodium: 139 mmol/L (ref 135–145)

## 2020-02-10 LAB — PREPARE RBC (CROSSMATCH)

## 2020-02-10 MED ORDER — SODIUM CHLORIDE 0.9% IV SOLUTION
Freq: Once | INTRAVENOUS | Status: AC
  Filled 2020-02-10: qty 250

## 2020-02-10 MED ORDER — ONDANSETRON HCL 4 MG PO TABS: 4.0000 mg | ORAL_TABLET | Freq: Four times a day (QID) | ORAL | Status: DC | PRN

## 2020-02-10 MED ORDER — VENLAFAXINE HCL ER 75 MG PO CP24
75.0000 mg | ORAL_CAPSULE | Freq: Every day | ORAL | Status: DC
  Administered 2020-02-11: 75 mg via ORAL
  Filled 2020-02-10 (×2): qty 1

## 2020-02-10 MED ORDER — ACETAMINOPHEN 325 MG PO TABS: 650.0000 mg | ORAL_TABLET | Freq: Four times a day (QID) | ORAL | Status: DC | PRN

## 2020-02-10 MED ORDER — SODIUM CHLORIDE 0.9 % IV SOLN: INTRAVENOUS | Status: DC

## 2020-02-10 MED ORDER — TRAZODONE HCL 50 MG PO TABS
50.0000 mg | ORAL_TABLET | Freq: Every day | ORAL | Status: DC
  Administered 2020-02-10: 50 mg via ORAL
  Filled 2020-02-10: qty 1

## 2020-02-10 MED ORDER — FUROSEMIDE 10 MG/ML IJ SOLN
20.0000 mg | Freq: Once | INTRAMUSCULAR | Status: AC
  Administered 2020-02-10: 20 mg via INTRAVENOUS
  Filled 2020-02-10: qty 2

## 2020-02-10 MED ORDER — ONDANSETRON HCL 4 MG/2ML IJ SOLN: 4.0000 mg | Freq: Four times a day (QID) | INTRAMUSCULAR | Status: DC | PRN

## 2020-02-10 MED ORDER — ACETAMINOPHEN 650 MG RE SUPP: 650.0000 mg | Freq: Four times a day (QID) | RECTAL | Status: DC | PRN

## 2020-02-10 MED ORDER — SODIUM CHLORIDE 0.9% IV SOLUTION
Freq: Once | INTRAVENOUS | Status: DC
Start: 2020-02-10 — End: 2020-02-10

## 2020-02-10 MED ORDER — MOMETASONE FURO-FORMOTEROL FUM 200-5 MCG/ACT IN AERO
2.0000 | INHALATION_SPRAY | Freq: Two times a day (BID) | RESPIRATORY_TRACT | Status: DC
  Filled 2020-02-10: qty 8.8

## 2020-02-10 MED ORDER — SODIUM CHLORIDE 0.9 % IV SOLN
10.0000 mL/h | Freq: Once | INTRAVENOUS | Status: AC
  Administered 2020-02-10: 10 mL/h via INTRAVENOUS

## 2020-02-10 NOTE — H&P (Signed)
History and Physical    Virginia Walters WUJ:[id number] DOB: 09/20/1940 DOA: 02/10/2020  PCP: Susy Frizzle, MD   Patient coming from: home  I have personally briefly reviewed patient's old medical records in [place]  Chief Complaint: Fatigue, low hemoglobin  HPI: Virginia Walters is a 79 y.o. female with medical history significant for systolic heart failure , last EF 45 % on as needed oxygen at home at 2 L%, HTN, glaucoma, who was sent to the ER by her PCP for hemoglobin of 6.1 after patient presented 3 days prior with a complaint of fatigue .  Work-up in the outpatient revealed hemoglobin 6.1, iron 19, B12 over 2000 and TSH 1.4.  Creatinine at that time was 1.61.  Patient denies black or bloody stool or any change in bowel habits.  Denies abdominal pain.  Denies nausea or vomiting.  Denies any bleeding or bruising.  Denies chest pain or shortness of breath.  Has had no recent acute illness, denies cough, fever or chills.  Said she has been using Aleve twice a day for the past year but denies abdominal pain. ED Course: On arrival BP 113/58 and pulse 89 with otherwise normal vitals.  Blood work significant for hemoglobin of 6, and creatinine of 2.13, up from her baseline around 1.3.  Patient did not allow rectal exam for stool guaiac.  EKG showed normal sinus rhythm without ischemic ST-T wave changes.  She was started on a unit of blood and hospitalist consulted for admission. Review of Systems: As per HPI otherwise all other systems on review of systems negative.    Past Medical History:  Diagnosis Date  . CHF (congestive heart failure) ([place])   . Dyslipidemia   . Frequent falls   . GERD (gastroesophageal reflux disease)   . HTN (hypertension)   . Memory loss   . PFO (patent foramen ovale)   . Retinal artery occlusion   . Weakness     Past Surgical History:  Procedure Laterality Date  . ANKLE ARTHROSCOPY WITH OPEN REDUCTION INTERNAL FIXATION (ORIF) Right   .  APPENDECTOMY    . COLONOSCOPY N/A 07/21/2018   Procedure: COLONOSCOPY;  Surgeon: Lin Landsman, MD;  Location: [facility] ENDOSCOPY;  Service: Gastroenterology;  Laterality: N/A;  . ESOPHAGOGASTRODUODENOSCOPY N/A 07/21/2018   Procedure: ESOPHAGOGASTRODUODENOSCOPY (EGD);  Surgeon: Lin Landsman, MD;  Location: [facility] ENDOSCOPY;  Service: Gastroenterology;  Laterality: N/A;  . TUBAL LIGATION       reports that she quit smoking about 39 years ago. Her smoking use included cigarettes. She has a 15.00 pack-year smoking history. She has never used smokeless tobacco. She reports current alcohol use. She reports that she does not use drugs.  Allergies  Allergen Reactions  . Oxycodone Itching    Family History  Problem Relation Age of Onset  . Esophageal cancer Father   . Dementia Mother       Prior to Admission medications   Medication Sig Start Date End Date Taking? Authorizing Provider  B Complex-Biotin-FA (B-COMPLEX PO) Take 1 capsule by mouth daily.     [provider]  Calcium Carbonate-Vitamin D (CALCIUM + D PO) Take 1 capsule by mouth 2 (two) times daily.     [provider]  Cholecalciferol (VITAMIN D) 2000 UNITS CAPS Take 1 capsule by mouth daily.     [provider]  dorzolamide (TRUSOPT) 2 % ophthalmic solution Place 1 drop into the left eye 2 (two) times daily.     [provider]  fish oil-omega-3 fatty acids 1000 MG capsule Take 2 g by mouth 2 (two) times daily.      [provider]  hydrochlorothiazide (HYDRODIURIL) 25 MG tablet Take 1 tablet (25 mg total) by mouth daily. 12/10/18   Susy Frizzle, MD  latanoprost (XALATAN) 0.005 % ophthalmic solution Place 1 drop into the left eye at bedtime.  12/05/17   [provider]  lisinopril (ZESTRIL) 40 MG tablet Take 1 tablet (40 mg total) by mouth daily. 12/13/18 03/13/19  Susy Frizzle, MD  MELATONIN PO Take 5 mg by mouth at bedtime.     [provider]  Multiple  Vitamin (MULTIVITAMIN) tablet Take 1 tablet by mouth daily.      [provider]  pantoprazole (PROTONIX) 40 MG tablet TAKE 1 TABLET BY MOUTH TWICE (2) DAILY 12/11/18   [place], Modena Nunnery, MD  potassium chloride (KLOR-CON) 10 MEQ tablet TAKE 1 TABLET BY MOUTH  DAILY 12/24/19   Susy Frizzle, MD  pravastatin (PRAVACHOL) 40 MG tablet Take 1 tablet (40 mg total) by mouth daily. 01/30/20   Susy Frizzle, MD  predniSONE (DELTASONE) 20 MG tablet 3 tabs poqday 1-2, 2 tabs poqday 3-4, 1 tab poqday 5-6 12/09/19   Susy Frizzle, MD  SYMBICORT 160-4.5 MCG/ACT inhaler INHALE 2 PUFFS INTO THE LUNGS 2 TIMES DAILY 03/26/19   Susy Frizzle, MD  traZODone (DESYREL) 50 MG tablet Take 1 tablet (50 mg total) by mouth at bedtime. 01/31/20   Susy Frizzle, MD  TURMERIC PO Take 1 capsule by mouth daily.    [provider]  venlafaxine XR (EFFEXOR XR) 75 MG 24 hr capsule Take 1 capsule (75 mg total) by mouth daily with breakfast. Stop escitalopram 02/07/20   Susy Frizzle, MD  verapamil (CALAN-SR) 240 MG CR tablet TAKE 1 TABLET BY MOUTH AT  BEDTIME 11/11/19   Susy Frizzle, MD  vitamin B-12 (CYANOCOBALAMIN) 1000 MCG tablet Take 1,000 mcg by mouth daily.    [provider]  VITAMIN E PO Take 180 mg by mouth daily.     [provider]    Physical Exam: Vitals:   02/10/20 1716 02/10/20 1723 02/10/20 1844  BP:  (!) 113/58 (!) 140/59  Pulse:  89 75  Resp:  18 18  Temp:  98.9 F (37.2 C)   TempSrc:  Oral   SpO2:  94% 100%  Weight: 115.2 kg    Height: 5\' 7"  (1.702 m)       Vitals:   02/10/20 1716 02/10/20 1723 02/10/20 1844  BP:  (!) 113/58 (!) 140/59  Pulse:  89 75  Resp:  18 18  Temp:  98.9 F (37.2 C)   TempSrc:  Oral   SpO2:  94% 100%  Weight: 115.2 kg    Height: 5\' 7"  (1.702 m)        Constitutional: Alert and oriented x 3 . Not in any apparent distress HEENT:      Head: Normocephalic and atraumatic.         Eyes: PERLA, EOMI, Conjunctivae  is pale. Sclera is non-icteric.       Mouth/Throat: Mucous membranes are moist.       Neck: Supple with no signs of meningismus. Cardiovascular: Regular rate and rhythm. No murmurs, gallops, or rubs. 2+ symmetrical distal pulses are present . No JVD. No LE edema Respiratory: Respiratory effort normal .Lungs sounds clear bilaterally. No wheezes, crackles, or rhonchi.  Gastrointestinal: Soft, non  tender, and non distended with positive bowel sounds. No rebound or guarding. Genitourinary: No CVA tenderness. Musculoskeletal: Nontender with normal range of motion in all extremities. No cyanosis, or erythema of extremities. Neurologic: Normal speech and language. Face is symmetric. Moving all extremities. No gross focal neurologic deficits . Skin: Skin is warm, dry.  No rash or ulcers Psychiatric: Mood and affect are normal Speech and behavior are normal   Labs on Admission: I have personally reviewed following labs and imaging studies  CBC: Recent Labs  Lab 02/07/20 1557 02/10/20 1720  WBC 8.9 8.7  NEUTROABS 5,162 5.4  HGB 6.1* 6.0*  HCT 22.7* 21.6*  MCV 68.4* 67.3*  PLT 458* 594*   Basic Metabolic Panel: Recent Labs  Lab 02/07/20 1557 02/10/20 1720  NA 142 139  K 4.4 3.8  CL 103 102  CO2 27 21*  GLUCOSE 120* 145*  BUN 30* 42*  CREATININE 1.61* 2.13*  CALCIUM 10.6* 9.6   GFR: Estimated Creatinine Clearance: 28.5 mL/min (A) (by C-G formula based on SCr of 2.13 mg/dL (H)). Liver Function Tests: Recent Labs  Lab 02/07/20 1557  AST 12  ALT 11  BILITOT 0.3  PROT 6.7   No results for input(s): LIPASE, AMYLASE in the last 168 hours. No results for input(s): AMMONIA in the last 168 hours. Coagulation Profile: No results for input(s): INR, PROTIME in the last 168 hours. Cardiac Enzymes: No results for input(s): CKTOTAL, CKMB, CKMBINDEX, TROPONINI in the last 168 hours. BNP (last 3 results) No results for input(s): PROBNP in the last 8760 hours. HbA1C: No results for  input(s): HGBA1C in the last 72 hours. CBG: No results for input(s): GLUCAP in the last 168 hours. Lipid Profile: No results for input(s): CHOL, HDL, LDLCALC, TRIG, CHOLHDL, LDLDIRECT in the last 72 hours. Thyroid Function Tests: No results for input(s): TSH, T4TOTAL, FREET4, T3FREE, THYROIDAB in the last 72 hours. Anemia Panel: No results for input(s): VITAMINB12, FOLATE, FERRITIN, TIBC, IRON, RETICCTPCT in the last 72 hours. Urine analysis:    Component Value Date/Time   COLORURINE YELLOW (A) 10/25/2016 1709   APPEARANCEUR CLEAR (A) 10/25/2016 1709   LABSPEC 1.017 10/25/2016 1709   PHURINE 5.0 10/25/2016 1709   GLUCOSEU NEGATIVE 10/25/2016 1709   HGBUR NEGATIVE 10/25/2016 1709   BILIRUBINUR NEGATIVE 10/25/2016 1709   KETONESUR 5 (A) 10/25/2016 1709   PROTEINUR NEGATIVE 10/25/2016 1709   NITRITE NEGATIVE 10/25/2016 1709   LEUKOCYTESUR MODERATE (A) 10/25/2016 1709    Radiological Exams on Admission: No results found.  EKG: Independently reviewed. Interpretation : EKG showed normal sinus rhythm without ischemic ST-T wave changes.  Assessment/Plan 79 year old female with history of CHF, HTN and glaucoma sent from outpatient for hemoglobin of 6 after presenting with fatigue    Symptomatic anemia -Hemoglobin of 6, down from 10.4 just 4 months ago.  Outpatient blood work on 02/07/2020 shows serum iron of 19, B12 over 2000 with normal TSH -Patient did not allow rectal exam for stool for occult blood but reports no black or bloody stool and no vomiting and no other evidence of bleeding.  Has been using Aleve to 220 mg twice daily for the past 1 year -Etiology uncertain but would be concerned about occult GI bleed -Continue transfusion of 2 unit PRBC with Lasix between units given CHF history -GI consult for possible endoscopy so we will keep n.p.o. after midnight.    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, stage 3a (HCC) -Creatinine 2.13 above baseline of 1.61 -Suspect  prerenal -We will  hold off on IV hydration as patient will be getting blood and has a history of CHF -Continue to monitor renal function and avoid nephrotoxins    HTN (hypertension) -Blood pressure soft so we will hold home antihypertensives for now    GERD (gastroesophageal reflux disease) -IV Protonix    Chronic systolic heart failure (HCC) -EF 45 to 50% in December 2019 -Hold home ACE/ARB and beta-blocker due to soft blood pressure -Monitor for fluid overload given blood transfusions    COPD with chronic bronchitis ([place]) -Not acutely exacerbated -DuoNebs as needed    DVT prophylaxis: SCDs Code Status: full code  Family Communication:  none  Disposition Plan: Back to previous home environment Consults called: GI Status:obs    Athena Masse MD [facility]     02/10/2020, 7:23 PM

## 2020-02-10 NOTE — ED Triage Notes (Signed)
Pt to the er for low HGB. Pt denies any bleeding in stool or urine. Pt MD called her today to report the results. Pt is pale but in no distress.

## 2020-02-10 NOTE — ED Provider Notes (Addendum)
ER Provider Note       Time seen: 6:41 PM    I have reviewed the vital signs and the nursing notes.  HISTORY   Chief Complaint low Hgb    HPI Virginia Walters is a 79 y.o. female with a history of CHF, hyperlipidemia, GERD, hypertension, memory loss, renal artery occlusion, weakness who presents today for low hemoglobin.  Patient denies any bleeding in the stool or urine.  Patient had blood work done by her doctor which revealed an anemia.  Hemoglobin was 10, 4 months ago, reportedly it is 6 today.  She denies any complaints.  Past Medical History:  Diagnosis Date  . CHF (congestive heart failure) ([place])   . Dyslipidemia   . Frequent falls   . GERD (gastroesophageal reflux disease)   . HTN (hypertension)   . Memory loss   . PFO (patent foramen ovale)   . Retinal artery occlusion   . Weakness     Past Surgical History:  Procedure Laterality Date  . ANKLE ARTHROSCOPY WITH OPEN REDUCTION INTERNAL FIXATION (ORIF) Right   . APPENDECTOMY    . COLONOSCOPY N/A 07/21/2018   Procedure: COLONOSCOPY;  Surgeon: Lin Landsman, MD;  Location: [facility] ENDOSCOPY;  Service: Gastroenterology;  Laterality: N/A;  . ESOPHAGOGASTRODUODENOSCOPY N/A 07/21/2018   Procedure: ESOPHAGOGASTRODUODENOSCOPY (EGD);  Surgeon: Lin Landsman, MD;  Location: [facility] ENDOSCOPY;  Service: Gastroenterology;  Laterality: N/A;  . TUBAL LIGATION      Allergies Oxycodone  Review of Systems Constitutional: Negative for fever. Cardiovascular: Negative for chest pain. Respiratory: Negative for shortness of breath. Gastrointestinal: Negative for abdominal pain, vomiting and diarrhea. Musculoskeletal: Negative for back pain. Skin: Negative for rash. Neurological: Negative for headaches, positive for weakness  All systems negative/normal/unremarkable except as stated in the HPI  ____________________________________________   PHYSICAL EXAM:  VITAL SIGNS: Vitals:   02/10/20 1723  BP: (!) 113/58   Pulse: 89  Resp: 18  Temp: 98.9 F (37.2 C)  SpO2: 94%    Constitutional: Alert and oriented. Well appearing and in no distress. Eyes: Conjunctivae are pale ENT      Head: Normocephalic and atraumatic.      Nose: No congestion/rhinnorhea.      Mouth/Throat: Mucous membranes are moist.      Neck: No stridor. Cardiovascular: Normal rate, regular rhythm. No murmurs, rubs, or gallops. Respiratory: Normal respiratory effort without tachypnea nor retractions. Breath sounds are clear and equal bilaterally. No wheezes/rales/rhonchi. Gastrointestinal: Soft and nontender. Normal bowel sounds Musculoskeletal: Nontender with normal range of motion in extremities. No lower extremity tenderness nor edema. Neurologic:  Normal speech and language. No gross focal neurologic deficits are appreciated.  Skin:  Skin is warm, dry and intact.  Pallor is noted Psychiatric: Speech and behavior are normal.  ____________________________________________  EKG: Interpreted by me.  Sinus rhythm with rate of 89 bpm, LVH with repolarization abnormality, normal QT  ____________________________________________   LABS (pertinent positives/negatives)  Labs Reviewed  CBC WITH DIFFERENTIAL/PLATELET - Abnormal; Notable for the following components:      Result Value   RBC 3.21 (*)    Hemoglobin 6.0 (*)    HCT 21.6 (*)    MCV 67.3 (*)    MCH 18.7 (*)    MCHC 27.8 (*)    RDW 18.2 (*)    Platelets 435 (*)    All other components within normal limits  BASIC METABOLIC PANEL - Abnormal; Notable for the following components:   CO2 21 (*)    Glucose, Bld  145 (*)    BUN 42 (*)    Creatinine, Ser 2.13 (*)    GFR calc non Af Amer 22 (*)    GFR calc Af Amer 25 (*)    Anion gap 16 (*)    All other components within normal limits  TYPE AND SCREEN  TYPE AND SCREEN   CRITICAL CARE Performed by: Laurence Aly   Total critical care time: 15 minutes  Critical care time was exclusive of separately billable  procedures and treating other patients.  Critical care was necessary to treat or prevent imminent or life-threatening deterioration.  Critical care was time spent personally by me on the following activities: development of treatment plan with patient and/or surrogate as well as nursing, discussions with consultants, evaluation of patient's response to treatment, examination of patient, obtaining history from patient or surrogate, ordering and performing treatments and interventions, ordering and review of laboratory studies, ordering and review of radiographic studies, pulse oximetry and re-evaluation of patient's condition.  DIFFERENTIAL DIAGNOSIS  Anemia, dehydration, electrolyte abnormality, renal failure  ASSESSMENT AND PLAN  Anemia, acute on chronic kidney disease   Plan: The patient had presented for anemia of uncertain etiology, she has declined Hemoccult testing at this time. Patient's labs revealed significant anemia with a hemoglobin of 6 and hematocrit of 21.6.  Labs also revealed an elevated BUN and creatinine.  I have ordered 1 unit of packed red blood cells for transfusion.  I will discuss with the hospitalist for admission.  Lenise Arena MD    Note: This note was generated in part or whole with voice recognition software. Voice recognition is usually quite accurate but there are transcription errors that can and very often do occur. I apologize for any typographical errors that were not detected and corrected.     Earleen Newport, MD 02/10/20 Eligah East, MD 02/10/20 [phone]

## 2020-02-10 NOTE — ED Notes (Signed)
Attempted to call report at this time. Per 1C Secretary Rise Paganini, accepting RN unavailable to accept report at this time. Secretary was given this RN's name and number and informed to relay to accepting RN that a rolling report would be initated within 24min if no call back. Secretary verbalized understanding

## 2020-02-10 NOTE — ED Notes (Signed)
Blood Admin Rate changed from 179mL/hr to 156mL/hr (due to pt's hx of CHF)

## 2020-02-11 ENCOUNTER — Encounter: Payer: Self-pay | Admitting: Internal Medicine

## 2020-02-11 ENCOUNTER — Observation Stay: Payer: Medicare Other | Admitting: Anesthesiology

## 2020-02-11 ENCOUNTER — Encounter
Admission: EM | Disposition: A | Discharge status: Home / Self Care | Payer: Self-pay | Source: Home / Self Care | Attending: Emergency Medicine

## 2020-02-11 DIAGNOSIS — K253 Acute gastric ulcer without hemorrhage or perforation: Secondary | ICD-10-CM

## 2020-02-11 DIAGNOSIS — I130 Hypertensive heart and chronic kidney disease with heart failure and stage 1 through stage 4 chronic kidney disease, or unspecified chronic kidney disease: Secondary | ICD-10-CM | POA: Diagnosis not present

## 2020-02-11 DIAGNOSIS — I5023 Acute on chronic systolic (congestive) heart failure: Secondary | ICD-10-CM | POA: Diagnosis not present

## 2020-02-11 DIAGNOSIS — K31819 Angiodysplasia of stomach and duodenum without bleeding: Secondary | ICD-10-CM

## 2020-02-11 DIAGNOSIS — D649 Anemia, unspecified: Secondary | ICD-10-CM | POA: Diagnosis not present

## 2020-02-11 DIAGNOSIS — K259 Gastric ulcer, unspecified as acute or chronic, without hemorrhage or perforation: Secondary | ICD-10-CM | POA: Diagnosis not present

## 2020-02-11 HISTORY — PX: ESOPHAGOGASTRODUODENOSCOPY: SHX5428

## 2020-02-11 LAB — BPAM RBC
Blood Product Expiration Date: 202107212359
Blood Product Expiration Date: 202107222359
ISSUE DATE / TIME: 202106281934
ISSUE DATE / TIME: 202106282301
Unit Type and Rh: 6200
Unit Type and Rh: 6200

## 2020-02-11 LAB — TYPE AND SCREEN
ABO/RH(D): A POS
Antibody Screen: NEGATIVE
Unit division: 0
Unit division: 0

## 2020-02-11 LAB — HEMOGLOBIN AND HEMATOCRIT, BLOOD
HCT: 27.6 % — ABNORMAL LOW (ref 36.0–46.0)
Hemoglobin: 8.3 g/dL — ABNORMAL LOW (ref 12.0–15.0)

## 2020-02-11 SURGERY — EGD (ESOPHAGOGASTRODUODENOSCOPY)
Anesthesia: General

## 2020-02-11 MED ORDER — PANTOPRAZOLE SODIUM 40 MG PO TBEC
40.0000 mg | DELAYED_RELEASE_TABLET | Freq: Two times a day (BID) | ORAL | 1 refills | Status: DC
Start: 2020-02-11 — End: 2020-03-04

## 2020-02-11 MED ORDER — FERROUS SULFATE 325 (65 FE) MG PO TBEC
325.0000 mg | DELAYED_RELEASE_TABLET | Freq: Two times a day (BID) | ORAL | 3 refills | Status: DC
Start: 2020-02-11 — End: 2020-02-21

## 2020-02-11 MED ORDER — PROPOFOL 10 MG/ML IV BOLUS
INTRAVENOUS | Status: DC | PRN
  Administered 2020-02-11: 70 mg via INTRAVENOUS

## 2020-02-11 MED ORDER — LIDOCAINE 2% (20 MG/ML) 5 ML SYRINGE
INTRAMUSCULAR | Status: DC | PRN
  Administered 2020-02-11: 100 mg via INTRAVENOUS

## 2020-02-11 MED ORDER — IPRATROPIUM-ALBUTEROL 0.5-2.5 (3) MG/3ML IN SOLN
RESPIRATORY_TRACT | Status: AC
  Filled 2020-02-11: qty 3

## 2020-02-11 MED ORDER — PROPOFOL 500 MG/50ML IV EMUL
INTRAVENOUS | Status: DC | PRN
  Administered 2020-02-11: 200 ug/kg/min via INTRAVENOUS

## 2020-02-11 NOTE — Transfer of Care (Signed)
Immediate Anesthesia Transfer of Care Note  Patient: [name]  Procedure(s) Performed: ESOPHAGOGASTRODUODENOSCOPY (EGD) (N/A )  Patient Location: Endoscopy Unit  Anesthesia Type:General  Level of Consciousness: awake  Airway & Oxygen Therapy: Patient connected to nasal cannula oxygen  Post-op Assessment: Post -op Vital signs reviewed and stable  Post vital signs: stable  Last Vitals:  Vitals Value Taken Time  BP 155/68 02/11/20 1156  Temp 36.1 C 02/11/20 1157  Pulse 78 02/11/20 1157  Resp 27 02/11/20 1157  SpO2 97 % 02/11/20 1157  Vitals shown include unvalidated device data.  Last Pain:  Vitals:   02/11/20 1157  TempSrc: Temporal  PainSc:          Complications: No complications documented.

## 2020-02-11 NOTE — Anesthesia Postprocedure Evaluation (Signed)
Anesthesia Post Note  [name]  Procedure(s) Performed: ESOPHAGOGASTRODUODENOSCOPY (EGD) (N/A )  Patient location during evaluation: Endoscopy Anesthesia Type: General Level of consciousness: awake and alert Pain management: pain level controlled Vital Signs Assessment: post-procedure vital signs reviewed and stable Respiratory status: spontaneous breathing, nonlabored ventilation, respiratory function stable and patient connected to nasal cannula oxygen Cardiovascular status: blood pressure returned to baseline and stable Postop Assessment: no apparent nausea or vomiting Anesthetic complications: no   No complications documented.   Last Vitals:  Vitals:   02/11/20 1200 02/11/20 1207  BP: (!) 143/69   Pulse:    Resp: (!) 22 (!) 22  Temp: (!) 36.1 C   SpO2:      Last Pain:  Vitals:   02/11/20 1217  TempSrc:   PainSc: 0-No pain                 Arita Miss

## 2020-02-11 NOTE — Discharge Summary (Signed)
Physician Discharge Summary  CARDELIA SASSANO KWI:[id number] DOB: 08-07-1941 DOA: 02/10/2020  PCP: Susy Frizzle, MD  Admit date: 02/10/2020 Discharge date: 02/11/2020  Admitted From: Home Disposition:  Home  Recommendations for Outpatient Follow-up:  1. Follow up with PCP in 1-2 weeks 2. Follow-up with gastroenterology in 2 to 3 weeks 3. Please obtain BMP/CBC in one week 4. Please follow up on the following pending results: None  Home Health: No Equipment/Devices: Home oxygen Discharge Condition: Stable CODE STATUS: Full Diet recommendation: Heart Healthy   Brief/Interim Summary: Virginia Walters is a 79 y.o. female with medical history significant for systolic heart failure , last EF 45 % on as needed oxygen at home at 2 L%, HTN, glaucoma, who was sent to the ER by her PCP for hemoglobin of 6.1 after patient presented 3 days prior with a complaint of fatigue .  Work-up in the outpatient revealed hemoglobin 6.1, iron 19, B12 over 2000 and TSH 1.4.  Creatinine at that time was 1.61.  Patient denies black or bloody stool or any change in bowel habits.  Denies abdominal pain.  Denies nausea or vomiting.  Denies any bleeding or bruising.  Denies chest pain or shortness of breath.  Has had no recent acute illness, denies cough, fever or chills.  Said she has been using Aleve twice a day for the past year but denies abdominal pain.  Also denied rectal exam for stool guaiac. She was given 2 unit of PRBCs, hemoglobin on discharge day was 8.3. He was taken for EGD by GI in the morning.  It shows multiple nonbleeding gastric ulcerations.  Biopsies were taken for H. pylori and pathology.  She was advised to avoid all the NSAIDs and take Protonix 40 mg twice daily for next 8 weeks.  She will follow-up with gastroenterology as an outpatient. She had low iron at 12.  Given iron supplement and will need to follow-up with primary care physician for further management.  Patient has an history of  chronic systolic heart failure, EF of 45 to 50% on echo done in December 2019.  No concern for acute decompensation.  She will follow-up with her cardiologist and continue with her home meds.  Her home antihypertensives were held initially due to some softer blood pressure which improved after transfusion.  She will resume her home medications and follow-up with her primary care physician.  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 As we discussed you do have some ulcers in your stomach which were not bleeding.  You might have some bleeding through those ulcers.  It is very important that you do not use any Goody powder, ibuprofen or Aleve.  That means any medication that will come under NSAID group. Please take Protonix 40 mg twice daily and follow-up with gastroenterologist in 2 to 3 weeks. During EGD today they did some biopsies to rule out a bacterial infection which can also cause ulceration, your gastroenterologist will inform you regarding results and treat if needed. I am also starting you  on a iron supplement. I am stopping your B12 supplement because your B12 levels were high. Please follow-up with your primary care physician for further management.   Increase activity slowly   Complete by: As directed      Allergies as of 02/11/2020      Reactions   Oxycodone Itching      Medication List    STOP taking these medications   vitamin B-12 1000 MCG tablet Commonly known as: CYANOCOBALAMIN     TAKE these medications   B-COMPLEX PO Take 1  capsule by mouth daily.   CALCIUM + D PO Take 1 capsule by mouth 2 (two) times daily.   dorzolamide 2 % ophthalmic solution Commonly known as: TRUSOPT Place 1 drop into the left eye 2 (two) times daily.   ferrous sulfate 325 (65 FE) MG EC tablet Take 1 tablet (325 mg total) by mouth 2 (two) times daily.   fish oil-omega-3 fatty acids 1000 MG capsule Take 2 g by mouth 2 (two) times daily.   hydrochlorothiazide 25 MG tablet Commonly known as: HYDRODIURIL Take 1 tablet (25 mg total) by mouth daily.   latanoprost 0.005 % ophthalmic solution Commonly known as: XALATAN Place 1 drop into the left eye at bedtime.   lisinopril 40 MG tablet Commonly known as: ZESTRIL Take 40 mg by mouth daily.   melatonin 5 MG Tabs Take 5 mg by mouth at bedtime.   multivitamin tablet Take 1 tablet by mouth daily.   pantoprazole 40 MG tablet Commonly known as: PROTONIX Take 1 tablet (40 mg total) by mouth 2 (two) times daily.   potassium chloride 10 MEQ tablet Commonly known as: KLOR-CON TAKE 1 TABLET BY MOUTH  DAILY   pravastatin 40 MG tablet Commonly known as: PRAVACHOL Take 1 tablet (40 mg total) by mouth daily.   Symbicort 160-4.5 MCG/ACT inhaler Generic drug: budesonide-formoterol INHALE 2 PUFFS INTO THE LUNGS 2 TIMES DAILY What changed: See the new instructions.   traZODone 50 MG tablet Commonly known as: DESYREL Take 1 tablet (50 mg total) by mouth at bedtime.   Turmeric 500 MG Caps Take 500 mg by mouth daily.   venlafaxine XR 75 MG 24 hr capsule Commonly known as: Effexor XR Take 1 capsule (75 mg total) by mouth daily with breakfast. Stop escitalopram   verapamil 240 MG CR tablet Commonly known as: CALAN-SR TAKE 1 TABLET BY MOUTH AT  BEDTIME   Vitamin D 50 MCG (2000 UT) Caps Take 1 capsule by mouth daily.   vitamin E 180 MG (400 UNITS) capsule Take 180 mg by mouth daily.       Follow-up Information    Susy Frizzle, MD. Schedule an appointment as soon as  possible for a visit.   Specialty: Family Medicine Contact information: [address] [place] [address] [phone]        Virgel Manifold, MD. Schedule an appointment as soon as possible for a visit.   Specialty: Gastroenterology Contact information: [place] [address] [phone]              Allergies  Allergen Reactions  . Oxycodone Itching    Consultations:  GI  Procedures/Studies: EGD  Subjective: Patient has no new complaints today.  Going for EGD today.  Cussed the use of NSAID and advised to avoid it.  She can use Tylenol as needed.  Discharge Exam: Vitals:   02/11/20 1200 02/11/20 1207  BP: (!) 143/69  Pulse:    Resp: (!) 22 (!) 22  Temp: (!) 97 F (36.1 C)   SpO2:     Vitals:   02/11/20 1115 02/11/20 1157 02/11/20 1200 02/11/20 1207  BP: (!) 154/78  (!) 143/69   Pulse: 72     Resp: 20  (!) 22 (!) 22  Temp: 97.6 F (36.4 C) (!) 97 F (36.1 C) (!) 97 F (36.1 C)   TempSrc: Temporal Temporal Temporal   SpO2: 95%     Weight: 115 kg     Height: 5\' 7"  (1.702 m)       General: Pt is alert, awake, not in acute distress Cardiovascular: RRR, S1/S2 +, no rubs, no gallops Respiratory: CTA bilaterally, no wheezing, no rhonchi Abdominal: Soft, NT, ND, bowel sounds + Extremities: no edema, no cyanosis   The results of significant diagnostics from this hospitalization (including imaging, microbiology, ancillary and laboratory) are listed below for reference.    Microbiology: Recent Results (from the past 240 hour(s))  SARS Coronavirus 2 by RT PCR (hospital order, performed in [facility] hospital lab) Nasopharyngeal Nasopharyngeal Swab     Status: None   Collection Time: 02/10/20  8:07 PM   Specimen: Nasopharyngeal Swab  Result Value Ref Range Status   SARS Coronavirus 2 NEGATIVE NEGATIVE Final    Comment: (NOTE) SARS-CoV-2 target nucleic acids are NOT DETECTED.  The SARS-CoV-2 RNA is generally  detectable in upper and lower respiratory specimens during the acute phase of infection. The lowest concentration of SARS-CoV-2 viral copies this assay can detect is 250 copies / mL. A negative result does not preclude SARS-CoV-2 infection and should not be used as the sole basis for treatment or other patient management decisions.  A negative result may occur with improper specimen collection / handling, submission of specimen other than nasopharyngeal swab, presence of viral mutation(s) within the areas targeted by this assay, and inadequate number of viral copies (<250 copies / mL). A negative result must be combined with clinical observations, patient history, and epidemiological information.  Fact Sheet for Patients:   StrictlyIdeas.no  Fact Sheet for Healthcare Providers: BankingDealers.co.za  This test is not yet approved or  cleared by the [place] FDA and has been authorized for detection and/or diagnosis of SARS-CoV-2 by FDA under an Emergency Use Authorization (EUA).  This EUA will remain in effect (meaning this test can be used) for the duration of the COVID-19 declaration under Section 564(b)(1) of the Act, 21 U.S.C. section 360bbb-3(b)(1), unless the authorization is terminated or revoked sooner.  Performed at [facility], [address] [place],  [address]      Labs: BNP (last 3 results) Recent Labs    09/26/19 1029  BNP 14   Basic Metabolic Panel: Recent Labs  Lab 02/07/20 1557 02/10/20 1720  NA 142 139  K 4.4 3.8  CL 103 102  CO2 27 21*  GLUCOSE 120* 145*  BUN 30* 42*  CREATININE 1.61* 2.13*  CALCIUM 10.6* 9.6   Liver Function Tests: Recent Labs  Lab 02/07/20 1557  AST 12  ALT 11  BILITOT 0.3  PROT 6.7   No results for input(s): LIPASE, AMYLASE in the last 168 hours. No results for input(s): AMMONIA in the last 168 hours. CBC: Recent Labs  Lab 02/07/20 1557  02/10/20 1720 02/11/20 0523  WBC 8.9 8.7  --   NEUTROABS 5,162 5.4  --   HGB 6.1* 6.0* 8.3*  HCT 22.7* 21.6* 27.6*  MCV 68.4* 67.3*  --  PLT 458* 435*  --    Cardiac Enzymes: No results for input(s): CKTOTAL, CKMB, CKMBINDEX, TROPONINI in the last 168 hours. BNP: Invalid input(s): POCBNP CBG: No results for input(s): GLUCAP in the last 168 hours. D-Dimer No results for input(s): DDIMER in the last 72 hours. Hgb A1c No results for input(s): HGBA1C in the last 72 hours. Lipid Profile No results for input(s): CHOL, HDL, LDLCALC, TRIG, CHOLHDL, LDLDIRECT in the last 72 hours. Thyroid function studies No results for input(s): TSH, T4TOTAL, T3FREE, THYROIDAB in the last 72 hours.  Invalid input(s): FREET3 Anemia work up No results for input(s): VITAMINB12, FOLATE, FERRITIN, TIBC, IRON, RETICCTPCT in the last 72 hours. Urinalysis    Component Value Date/Time   COLORURINE YELLOW (A) 10/25/2016 1709   APPEARANCEUR CLEAR (A) 10/25/2016 1709   LABSPEC 1.017 10/25/2016 1709   PHURINE 5.0 10/25/2016 1709   GLUCOSEU NEGATIVE 10/25/2016 1709   HGBUR NEGATIVE 10/25/2016 1709   BILIRUBINUR NEGATIVE 10/25/2016 1709   KETONESUR 5 (A) 10/25/2016 1709   PROTEINUR NEGATIVE 10/25/2016 1709   NITRITE NEGATIVE 10/25/2016 1709   LEUKOCYTESUR MODERATE (A) 10/25/2016 1709   Sepsis Labs Invalid input(s): PROCALCITONIN,  WBC,  LACTICIDVEN Microbiology Recent Results (from the past 240 hour(s))  SARS Coronavirus 2 by RT PCR (hospital order, performed in [facility] hospital lab) Nasopharyngeal Nasopharyngeal Swab     Status: None   Collection Time: 02/10/20  8:07 PM   Specimen: Nasopharyngeal Swab  Result Value Ref Range Status   SARS Coronavirus 2 NEGATIVE NEGATIVE Final    Comment: (NOTE) SARS-CoV-2 target nucleic acids are NOT DETECTED.  The SARS-CoV-2 RNA is generally detectable in upper and lower respiratory specimens during the acute phase of infection. The lowest concentration of  SARS-CoV-2 viral copies this assay can detect is 250 copies / mL. A negative result does not preclude SARS-CoV-2 infection and should not be used as the sole basis for treatment or other patient management decisions.  A negative result may occur with improper specimen collection / handling, submission of specimen other than nasopharyngeal swab, presence of viral mutation(s) within the areas targeted by this assay, and inadequate number of viral copies (<250 copies / mL). A negative result must be combined with clinical observations, patient history, and epidemiological information.  Fact Sheet for Patients:   StrictlyIdeas.no  Fact Sheet for Healthcare Providers: BankingDealers.co.za  This test is not yet approved or  cleared by the [place] FDA and has been authorized for detection and/or diagnosis of SARS-CoV-2 by FDA under an Emergency Use Authorization (EUA).  This EUA will remain in effect (meaning this test can be used) for the duration of the COVID-19 declaration under Section 564(b)(1) of the Act, 21 U.S.C. section 360bbb-3(b)(1), unless the authorization is terminated or revoked sooner.  Performed at [facility], [address] [place], [place] [address]     Time coordinating discharge: Over 30 minutes  SIGNED:  Lorella Nimrod, MD  [facility] 02/11/2020, 3:42 PM  If 7PM-7AM, please contact night-coverage [email or web address]  This record has been created using Systems analyst. Errors have been sought and corrected,but may not always be located. Such creation errors do not reflect on the standard of care.

## 2020-02-11 NOTE — TOC Initial Note (Signed)
Transition of Care [facility]) - Initial/Assessment Note    Patient Details  Name: Virginia Walters MRN: [id number] Date of Birth: [birth year]  Transition of Care [facility]) CM/SW Contact:    Shelbie Hutching, RN Phone Number: 02/11/2020, 2:13 PM  Clinical Narrative:                 Patient placed under observation for symptomatic anemia.  Patient is from home where she lives alone.  Patient has a cane and walker and uses both, uses the walker around the house.  Patient is independent and drives.  Patient is current with her PCP and uses mail order for prescriptions.  Patient's daughter Anderson Malta is her 35 and lives in [place].   Expected Discharge Plan: Home/Self Care Barriers to Discharge: Continued Medical Work up   Patient Goals and CMS Choice Patient states their goals for this hospitalization and ongoing recovery are:: To get back home      Expected Discharge Plan and Services Expected Discharge Plan: Home/Self Care   Discharge Planning Services: CM Consult   Living arrangements for the past 2 months: Single Family Home                                      Prior Living Arrangements/Services Living arrangements for the past 2 months: Single Family Home Lives with:: Self Patient language and need for interpreter reviewed:: Yes Do you feel safe going back to the place where you live?: Yes      Need for Family Participation in Patient Care: Yes (Comment) (anemia) Care giver support system in place?: Yes (comment) (daughter) Current home services: DME (cane and walker) Criminal Activity/Legal Involvement Pertinent to Current Situation/Hospitalization: No - Comment as needed  Activities of Daily Living Home Assistive Devices/Equipment: Wheelchair, Radio producer (specify quad or straight) ADL Screening (condition at time of admission) Patient's cognitive ability adequate to safely complete daily activities?: Yes Is the patient deaf or have difficulty hearing?: No Does the patient  have difficulty seeing, even when wearing glasses/contacts?: No Does the patient have difficulty concentrating, remembering, or making decisions?: No Patient able to express need for assistance with ADLs?: Yes Does the patient have difficulty dressing or bathing?: No Independently performs ADLs?: Yes (appropriate for developmental age) Does the patient have difficulty walking or climbing stairs?: Yes Weakness of Legs: Both Weakness of Arms/Hands: None  Permission Sought/Granted Permission sought to share information with : Family Supports Permission granted to share information with : Yes, Verbal Permission Granted  Share Information with NAME: Rosalyn Charters     Permission granted to share info w Relationship: Daughter  Permission granted to share info w Contact Information: [phone]  Emotional Assessment Appearance:: Appears stated age Attitude/Demeanor/Rapport: Engaged Affect (typically observed): Accepting Orientation: : Oriented to Self, Oriented to Place, Oriented to  Time, Oriented to Situation Alcohol / Substance Use: Not Applicable Psych Involvement: No (comment)  Admission diagnosis:  AKI (acute kidney injury) ([place]) [N17.9] Symptomatic anemia [D64.9] Anemia, unspecified type [D64.9] Patient Active Problem List   Diagnosis Date Noted  . COPD with chronic bronchitis ([place]) 02/10/2020  . 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 COPD exacerbation ([place]) 10/08/2018  . Chronic systolic heart failure ([place]) 08/29/2018  . Acute on chronic heart failure with preserved ejection fraction ([place])   . Symptomatic anemia 07/20/2018  . Dementia with behavioral problem ([place]) 10/26/2016  . Alcohol abuse 10/26/2016  .  Memory loss 10/12/2016  . Gait abnormality 10/12/2016  . Orthostatic dizziness 10/12/2016  . Paresthesia 10/12/2016  . Abnormal EKG 12/28/2010  . HTN (hypertension)   . Dyslipidemia   . PFO (patent foramen ovale)   . Retinal artery  occlusion   . GERD (gastroesophageal reflux disease)    PCP:  Susy Frizzle, MD Pharmacy:   [place], [address] [place] [address] Phone: [phone] Fax: [place], [place] [facility] [address], [address] [place], [address] [place] [address] Phone: [phone] Fax: [phone]     Social Determinants of Health (SDOH) Interventions    Readmission Risk Interventions Readmission Risk Prevention Plan 07/22/2018  Post Dischage Appt Complete  Medication Screening Complete  Transportation Screening Complete  PCP follow-up Complete  Some recent data might be hidden

## 2020-02-11 NOTE — Care Management Obs Status (Signed)
[place] NOTIFICATION   Patient Details  Name: [name] MRN: [id number] Date of Birth: 1941/04/07   Medicare Observation Status Notification Given:  Yes    Shelbie Hutching, RN 02/11/2020, 2:06 PM

## 2020-02-11 NOTE — Consult Note (Signed)
Virginia Antigua, MD [address] [place], [place], [place], [address] [address], [place], [place], [place], [address] Phone: [phone]  Fax: [phone]  Consultation  Referring Provider:     Dr. Lamar Benes Primary Care Physician:  Susy Frizzle, MD Reason for Consultation:     Anemia  Date of Admission:  02/10/2020 Date of Consultation:  02/11/2020         HPI:   Virginia Walters is a 79 y.o. female with daily Aleve use, presents with acute anemia.  Denies any active bleeding.  No abdominal pain.  No nausea or vomiting.  Had an EGD for iron deficiency anemia in 2019 that showed clean-based gastric antral ulcers, hiatal hernia, Cameron erosions and gastric erosions.  Reports history of a colonoscopy 4 to 5 years ago.  States it was normal.  Report not available.  Past Medical History:  Diagnosis Date  . CHF (congestive heart failure) ([place])   . Dyslipidemia   . Frequent falls   . GERD (gastroesophageal reflux disease)   . HTN (hypertension)   . Memory loss   . PFO (patent foramen ovale)   . Retinal artery occlusion   . Weakness     Past Surgical History:  Procedure Laterality Date  . ANKLE ARTHROSCOPY WITH OPEN REDUCTION INTERNAL FIXATION (ORIF) Right   . APPENDECTOMY    . COLONOSCOPY N/A 07/21/2018   Procedure: COLONOSCOPY;  Surgeon: Lin Landsman, MD;  Location: [facility] ENDOSCOPY;  Service: Gastroenterology;  Laterality: N/A;  . ESOPHAGOGASTRODUODENOSCOPY N/A 07/21/2018   Procedure: ESOPHAGOGASTRODUODENOSCOPY (EGD);  Surgeon: Lin Landsman, MD;  Location: [facility] ENDOSCOPY;  Service: Gastroenterology;  Laterality: N/A;  . TUBAL LIGATION      Prior to Admission medications   Medication Sig Start Date End Date Taking? Authorizing Provider  B Complex-Biotin-FA (B-COMPLEX PO) Take 1 capsule by mouth daily.    Yes [provider]  Calcium Carbonate-Vitamin D (CALCIUM + D PO) Take 1 capsule by mouth 2 (two) times daily.    Yes [provider]  Cholecalciferol (VITAMIN D) 2000 UNITS CAPS Take 1 capsule by mouth daily.    Yes [provider]  dorzolamide (TRUSOPT) 2 % ophthalmic solution Place 1 drop into the left eye 2 (two) times daily.    Yes [provider]  fish oil-omega-3 fatty acids 1000 MG capsule Take 2 g by mouth 2 (two) times daily.     Yes [provider]  hydrochlorothiazide (HYDRODIURIL) 25 MG tablet Take 1 tablet (25 mg total) by mouth daily. 12/10/18  Yes Susy Frizzle, MD  latanoprost (XALATAN) 0.005 % ophthalmic solution Place 1 drop into the left eye at bedtime.  12/05/17  Yes [provider]  lisinopril (ZESTRIL) 40 MG tablet Take 40 mg by mouth daily.   Yes [provider]  melatonin 5 MG TABS Take 5 mg by mouth at bedtime.   Yes [provider]  Multiple Vitamin (MULTIVITAMIN) tablet Take 1 tablet by mouth daily.     Yes [provider]  potassium chloride (KLOR-CON) 10 MEQ tablet TAKE 1 TABLET BY MOUTH  DAILY Patient taking differently: Take 10 mEq by mouth daily.  12/24/19  Yes Susy Frizzle, MD  pravastatin (PRAVACHOL) 40 MG tablet Take 1 tablet (40 mg total) by mouth daily. 01/30/20  Yes Susy Frizzle, MD  SYMBICORT 160-4.5 MCG/ACT inhaler INHALE 2 PUFFS INTO THE LUNGS 2 TIMES DAILY Patient taking differently: Inhale 2 puffs into the lungs 2 (two)  times daily.  03/26/19  Yes Susy Frizzle, MD  traZODone (DESYREL) 50 MG tablet Take 1 tablet (50 mg total) by mouth at bedtime. 01/31/20  Yes Susy Frizzle, MD  Turmeric 500 MG CAPS Take 500 mg by mouth daily.    Yes [provider]  venlafaxine XR (EFFEXOR XR) 75 MG 24 hr capsule Take 1 capsule (75 mg total) by mouth daily with breakfast. Stop escitalopram 02/07/20  Yes Susy Frizzle, MD  verapamil (CALAN-SR) 240 MG CR tablet TAKE 1 TABLET BY MOUTH AT  BEDTIME Patient taking differently: Take 240 mg by mouth at bedtime.  11/11/19  Yes Susy Frizzle, MD  vitamin  B-12 (CYANOCOBALAMIN) 1000 MCG tablet Take 1,000 mcg by mouth daily.   Yes [provider]  vitamin E 180 MG (400 UNITS) capsule Take 180 mg by mouth daily.    Yes [provider]    Family History  Problem Relation Age of Onset  . Esophageal cancer Father   . Dementia Mother      Social History   Tobacco Use  . Smoking status: Former Smoker    Packs/day: 1.00    Years: 15.00    Pack years: 15.00    Types: Cigarettes    Quit date: 12/27/1980    Years since quitting: 39.1  . Smokeless tobacco: Never Used  Substance Use Topics  . Alcohol use: Yes    Comment: occassionally  . Drug use: No    Allergies as of 02/10/2020 - Review Complete 02/10/2020  Allergen Reaction Noted  . Oxycodone Itching 12/30/2014    Review of Systems:    All systems reviewed and negative except where noted in HPI.   Physical Exam:  Vital signs in last 24 hours: Vitals:   02/11/20 0237 02/11/20 0401 02/11/20 0839 02/11/20 1115  BP: (!) 155/69 (!) 155/62 (!) 153/51 (!) 154/78  Pulse: 65 65 71 72  Resp:  18 20 20   Temp:  98.2 F (36.8 C) 98.9 F (37.2 C) 97.6 F (36.4 C)  TempSrc:  Oral  Temporal  SpO2:  95% 95% 95%  Weight:    115 kg  Height:    5\' 7"  (1.702 m)   Last BM Date: 02/11/20 General:   Pleasant, cooperative in NAD Head:  Normocephalic and atraumatic. Eyes:   No icterus.   Conjunctiva pink. PERRLA. Ears:  Normal auditory acuity. Neck:  Supple; no masses or thyroidomegaly Lungs: Respirations even and unlabored. Lungs clear to auscultation bilaterally.   No wheezes, crackles, or rhonchi.  Abdomen:  Soft, nondistended, nontender. Normal bowel sounds. No appreciable masses or hepatomegaly.  No rebound or guarding.  Neurologic:  Alert and oriented x3;  grossly normal neurologically. Skin:  Intact without significant lesions or rashes. Cervical Nodes:  No significant cervical adenopathy. Psych:  Alert and cooperative. Normal affect.  LAB RESULTS: Recent Labs     02/10/20 1720 02/11/20 0523  WBC 8.7  --   HGB 6.0* 8.3*  HCT 21.6* 27.6*  PLT 435*  --    BMET Recent Labs    02/10/20 1720  NA 139  K 3.8  CL 102  CO2 21*  GLUCOSE 145*  BUN 42*  CREATININE 2.13*  CALCIUM 9.6   LFT No results for input(s): PROT, ALBUMIN, AST, ALT, ALKPHOS, BILITOT, BILIDIR, IBILI in the last 72 hours. PT/INR No results for input(s): LABPROT, INR in the last 72 hours.  STUDIES: No results found.    Impression / Plan:   Virginia  I Walters is a 79 y.o. y/o female with acute microcytic anemia  Proceed with EGD to rule out ulcers given her recent NSAID use  PPI IV twice daily  Continue serial CBCs and transfuse PRN Avoid NSAIDs Maintain 2 large-bore IV lines Please page GI with any acute hemodynamic changes, or signs of active GI bleeding  I have discussed alternative options, risks & benefits,  which include, but are not limited to, bleeding, infection, perforation,respiratory complication & drug reaction.  The patient agrees with this plan & written consent will be obtained.     Thank you for involving me in the care of this patient.      LOS: 0 days   Virgel Manifold, MD  02/11/2020, 11:31 AM

## 2020-02-11 NOTE — Op Note (Addendum)
[facility] Gastroenterology Patient Name: Virginia Walters Procedure Date: 02/11/2020 11:15 AM MRN: [id number] Account #: [id number] Date of Birth: [birth year] Admit Type: Outpatient Age: 79 Room: [facility] ENDO ROOM 3 Gender: Female Note Status: Finalized Procedure:             Upper GI endoscopy Indications:           Iron deficiency anemia Providers:             Lashann Hagg B. Bonna Gains MD, MD Medicines:             Monitored Anesthesia Care Complications:         No immediate complications. Procedure:             Pre-Anesthesia Assessment:                        - The risks and benefits of the procedure and the                         sedation options and risks were discussed with the                         patient. All questions were answered and informed                         consent was obtained.                        - Patient identification and proposed procedure were                         verified prior to the procedure.                        - ASA Grade Assessment: II - A patient with mild                         systemic disease.                        After obtaining informed consent, the endoscope was                         passed under direct vision. Throughout the procedure,                         the patient's blood pressure, pulse, and oxygen                         saturations were monitored continuously. The Endoscope                         was introduced through the mouth, and advanced to the                         third part of duodenum. The upper GI endoscopy was                         accomplished with ease. The patient tolerated the  procedure well. Findings:      The examined esophagus was normal.      Few non-bleeding gastric ulcers with a clean ulcer base (Forrest Class       III) were found in the gastric body and in the gastric antrum. The       largest lesion was 5 mm in largest dimension.      A  medium-sized hiatal hernia was present.      A single 3 mm angioectasia without bleeding was found in the second       portion of the duodenum. Coagulation for bleeding prevention using argon       plasma was successful.      The exam of the duodenum was otherwise normal. Impression:            - Normal esophagus.                        - Non-bleeding gastric ulcers with a clean ulcer base                         (Forrest Class III).                        - Medium-sized hiatal hernia.                        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                         Treated with argon plasma coagulation (APC).                        - No specimens collected. Recommendation:        - Return patient to hospital ward for ongoing care.                        - Continue present medications.                        - The findings and recommendations were discussed with                         the patient.                        - Perform an H. pylori serology today.                        - Use Protonix (pantoprazole) 40 mg PO BID for 8 weeks.                        - Pt is refusing a colonoscopy at this time. If                         hemoglobin stays stable pt to follow up in GI clinic                         as an outpatient to set up elective colonoscopy. If  hemoglobin drops or active bleeding occurs pt would                         need an inpatient colonoscopy if she is agreeable. At                         this time she would like to defer colonoscopy                        - Return to my office in 2 weeks.                        - The findings and recommendations were discussed with                         the patient.                        - Return to primary care physician in 2 weeks. Procedure Code(s):     --- Professional ---                        [phone], Esophagogastroduodenoscopy, flexible,                         transoral; with  control of bleeding, any method Diagnosis Code(s):     --- Professional ---                        K25.9, Gastric ulcer, unspecified as acute or chronic,                         without hemorrhage or perforation                        K44.9, Diaphragmatic hernia without obstruction or                         gangrene                        K31.819, Angiodysplasia of stomach and duodenum                         without bleeding                        D50.9, Iron deficiency anemia, unspecified CPT copyright 2019 American Medical Association. All rights reserved. The codes documented in this report are preliminary and upon coder review may  be revised to meet current compliance requirements.  [name], MD Margretta Sidle B. Bonna Gains MD, MD 02/11/2020 12:06:56 PM This report has been signed electronically. Number of Addenda: 0 Note Initiated On: 02/11/2020 11:15 AM Estimated Blood Loss:  Estimated blood loss: none.      [facility]

## 2020-02-11 NOTE — Anesthesia Preprocedure Evaluation (Signed)
Anesthesia Evaluation  Patient identified by MRN, date of birth, ID band Patient awake    Reviewed: Allergy & Precautions, H&P , NPO status , Patient's Chart, lab work & pertinent test results  History of Anesthesia Complications Negative for: history of anesthetic complications  Airway Mallampati: III  TM Distance: <3 FB Neck ROM: limited    Dental  (+) Chipped, Poor Dentition, Dental Advidsory Given   Pulmonary neg shortness of breath, COPD,  COPD inhaler and oxygen dependent, neg recent URI, former smoker,           Cardiovascular Exercise Tolerance: Good hypertension, (-) angina+CHF  (-) Past MI and (-) DOE (-) dysrhythmias (-) Valvular Problems/Murmurs     Neuro/Psych neg Seizures PSYCHIATRIC DISORDERS Dementia 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  Past Medical History: No date: Dyslipidemia No date: Frequent falls No date: GERD (gastroesophageal reflux disease) No date: HTN (hypertension) No date: Memory loss No date: PFO (patent foramen ovale) No date: Retinal artery occlusion No date: Weakness  Past Surgical History: No date: ANKLE ARTHROSCOPY WITH OPEN REDUCTION INTERNAL FIXATION  (ORIF); Right No date: APPENDECTOMY No date: TUBAL LIGATION     Reproductive/Obstetrics negative OB ROS                             Anesthesia Physical  Anesthesia Plan  ASA: III  Anesthesia Plan: General   Post-op Pain Management:    Induction: Intravenous  PONV Risk Score and Plan: Propofol infusion and TIVA  Airway Management Planned:  Natural Airway and Nasal Cannula  Additional Equipment:   Intra-op Plan:   Post-operative Plan:   Informed Consent: I have reviewed the patients History and Physical, chart, labs and discussed the procedure including the risks, benefits and alternatives for the proposed anesthesia with the patient or authorized representative who has indicated his/her understanding and acceptance.     Dental Advisory Given  Plan Discussed with: Anesthesiologist, CRNA and Surgeon  Anesthesia Plan Comments: (Patient consented for risks of anesthesia including but not limited to:  - adverse reactions to medications - risk of intubation if required - damage to teeth, lips or other oral mucosa - sore throat or hoarseness - Damage to heart, brain, lungs or loss of life  Patient voiced understanding.)        Anesthesia Quick Evaluation

## 2020-02-12 ENCOUNTER — Encounter: Payer: Self-pay | Admitting: Gastroenterology

## 2020-02-18 ENCOUNTER — Other Ambulatory Visit: Payer: Self-pay | Admitting: Family Medicine

## 2020-02-19 ENCOUNTER — Telehealth: Payer: Self-pay | Admitting: Family Medicine

## 2020-02-19 NOTE — Progress Notes (Signed)
  Chronic Care Management   Outreach Note  02/19/2020 Name: Virginia Walters MRN: [id number] DOB: 05-21-1941  Referred by: Susy Frizzle, MD Reason for referral : No chief complaint on file.   A second unsuccessful telephone outreach was attempted today. The patient was referred to pharmacist for assistance with care management and care coordination.  Follow Up Plan:   [place]

## 2020-02-21 ENCOUNTER — Ambulatory Visit (INDEPENDENT_AMBULATORY_CARE_PROVIDER_SITE_OTHER): Payer: Medicare Other | Admitting: Family Medicine

## 2020-02-21 ENCOUNTER — Other Ambulatory Visit: Payer: Self-pay

## 2020-02-21 VITALS — BP 120/70 | HR 95 | Temp 97.7°F | Ht 65.0 in | Wt 218.0 lb

## 2020-02-21 DIAGNOSIS — D500 Iron deficiency anemia secondary to blood loss (chronic): Secondary | ICD-10-CM | POA: Diagnosis not present

## 2020-02-21 DIAGNOSIS — R5382 Chronic fatigue, unspecified: Secondary | ICD-10-CM | POA: Diagnosis not present

## 2020-02-21 LAB — IRON,TIBC AND FERRITIN PANEL
%SAT: 8 % (calc) — ABNORMAL LOW (ref 16–45)
Ferritin: 39 ng/mL (ref 16–288)
Iron: 35 ug/dL — ABNORMAL LOW (ref 45–160)
TIBC: 428 mcg/dL (calc) (ref 250–450)

## 2020-02-21 LAB — CBC WITH DIFFERENTIAL/PLATELET
Absolute Monocytes: 744 cells/uL (ref 200–950)
Basophils Absolute: 120 cells/uL (ref 0–200)
Basophils Relative: 1.5 %
Eosinophils Absolute: 680 cells/uL — ABNORMAL HIGH (ref 15–500)
Eosinophils Relative: 8.5 %
HCT: 30.3 % — ABNORMAL LOW (ref 35.0–45.0)
Hemoglobin: 9 g/dL — ABNORMAL LOW (ref 11.7–15.5)
Lymphs Abs: 1512 cells/uL (ref 850–3900)
MCH: 22.2 pg — ABNORMAL LOW (ref 27.0–33.0)
MCHC: 29.7 g/dL — ABNORMAL LOW (ref 32.0–36.0)
MCV: 74.8 fL — ABNORMAL LOW (ref 80.0–100.0)
MPV: 10 fL (ref 7.5–12.5)
Monocytes Relative: 9.3 %
Neutro Abs: 4944 cells/uL (ref 1500–7800)
Neutrophils Relative %: 61.8 %
Platelets: 342 10*3/uL (ref 140–400)
RBC: 4.05 10*6/uL (ref 3.80–5.10)
RDW: 22.7 % — ABNORMAL HIGH (ref 11.0–15.0)
Total Lymphocyte: 18.9 %
WBC: 8 10*3/uL (ref 3.8–10.8)

## 2020-02-21 LAB — BASIC METABOLIC PANEL WITH GFR
BUN/Creatinine Ratio: 14 (calc) (ref 6–22)
BUN: 20 mg/dL (ref 7–25)
CO2: 26 mmol/L (ref 20–32)
Calcium: 10.1 mg/dL (ref 8.6–10.4)
Chloride: 105 mmol/L (ref 98–110)
Creat: 1.38 mg/dL — ABNORMAL HIGH (ref 0.60–0.93)
GFR, Est African American: 42 mL/min/{1.73_m2} — ABNORMAL LOW (ref >=60)
GFR, Est Non African American: 37 mL/min/{1.73_m2} — ABNORMAL LOW (ref >=60)
Glucose, Bld: 118 mg/dL — ABNORMAL HIGH (ref 65–99)
Potassium: 4 mmol/L (ref 3.5–5.3)
Sodium: 143 mmol/L (ref 135–146)

## 2020-02-21 LAB — CBC MORPHOLOGY

## 2020-02-21 MED ORDER — FERROUS SULFATE 325 (65 FE) MG PO TBEC: 325.0000 mg | DELAYED_RELEASE_TABLET | Freq: Two times a day (BID) | ORAL | 11 refills | Status: DC

## 2020-02-21 MED ORDER — DONEPEZIL HCL 10 MG PO TABS
10.0000 mg | ORAL_TABLET | Freq: Every day | ORAL | 11 refills | Status: DC
Start: 2020-02-21 — End: 2020-03-10

## 2020-02-21 NOTE — Progress Notes (Signed)
Subjective:    Patient ID: Virginia Walters, female    DOB: 02-08-1941, 79 y.o.   MRN: [id number]  HPI  02/07/20 Patient is a very pleasant 79 year old Caucasian female who presents today with her family member.  She has a history of mild dementia.  She presents with fatigue.  Recently her family member has had to increase her oxygen level to help her breathe.  However her oxygen saturation has consistently been 98% irregardless of her oxygen supplementation.  Today she is 98% on room air having not had any oxygen in more than 30 minutes.  Therefore I do not believe the oxygen is the reason that she is short of breath.  Rather than shortness of breath, the patient is actually endorsing fatigue and easy fatigability.  She is mildly tachycardic on exam today.  She also appears somewhat dehydrated.  Her conjunctiva are extremely pale as are her lips and skin.  She appears to be anemic.  She denies any chest pain.  She denies any cough.  She denies any abdominal pain.  She denies any melena or hematochezia.  She denies any epistaxis or bruising.  She denies any dysuria.  She denies any vomiting or diarrhea.  At that time, my plan was: After our history and physical, I believe the patient is suffering more from severe fatigue and easy fatigability rather than shortness of breath.  She is dehydrated on exam.  She is mildly tachycardic.  She is also very pale.  I am concerned that the patient may be anemic.  I will check a CBC, CMP, B12, TSH, and iron level to evaluate further.  If she is severely anemic, she will likely need GI consultation to rule out GI sources of bleeding as well as I suspect iron deficiency anemia.  Await the results of the lab work to determine the next step in the work-up.  02/21/20 Labs showed a hemoglobin of 6.1!.  Therefore she was referred to the emergency room for evaluation suspecting a GI bleed.  Admit date: 02/10/2020 Discharge date: 02/11/2020  Admitted From:  Home Disposition:  Home  Recommendations for Outpatient Follow-up:  1. Follow up with PCP in 1-2 weeks 2. Follow-up with gastroenterology in 2 to 3 weeks 3. Please obtain BMP/CBC in one week 4. Please follow up on the following pending results: None  Home Health: No Equipment/Devices: Home oxygen Discharge Condition: Stable CODE STATUS: Full Diet recommendation: Heart Healthy   Brief/Interim Summary: [name] a 79 y.o.femalewith medical history significant forsystolic heart failure , last EF 45%on as needed oxygen at home at 2 L%, HTN, glaucoma, who was sent to the ER by her PCP for hemoglobin of 6.1 after patient presented 3 days prior with a complaint of fatigue . Work-up in the outpatient revealed hemoglobin 6.1, iron 19, B12 over 2000 and TSH 1.4. Creatinine at that time was 1.61. Patient denies black or bloody stool or any change in bowel habits. Denies abdominal pain. Denies nausea or vomiting. Denies any bleeding or bruising. Denies chest painor shortness of breath. Has had no recent acute illness, denies cough, fever or chills. Said she has been using Aleve twice a day for the past year but denies abdominal pain.  Also denied rectal exam for stool guaiac. She was given 2 unit of PRBCs, hemoglobin on discharge day was 8.3. He was taken for EGD by GI in the morning.  It shows multiple nonbleeding gastric ulcerations.  Biopsies were taken for H. pylori and pathology.  She was advised to avoid all the NSAIDs and take Protonix 40 mg twice daily for next 8 weeks.  She will follow-up with gastroenterology as an outpatient. She had low iron at 12.  Given iron supplement and will need to follow-up with primary care physician for further management.  Patient has an history of chronic systolic heart failure, EF of 45 to 50% on echo done in December 2019.  No concern for acute decompensation.  She will follow-up with her cardiologist and continue with her home  meds.  Her home antihypertensives were held initially due to some softer blood pressure which improved after transfusion.  She will resume her home medications and follow-up with her primary care physician.  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 - Non-bleeding gastric ulcers with a clean ulcer base ([place] Class III). - Medium-sized hiatal hernia. 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 Treated with argon plasma coagulation (APC). - No specimens collected. - Return patient to hospital ward for ongoing care. - Continue present medications. - The findings and recommendations were discussed with the patient. - Perform an H. pylori serology today. - Use Protonix (pantoprazole) 40 mg PO BID for 8 weeks. - Pt is refusing a colonoscopy at this time. If hemoglobin stays stable pt to follow up in GI clinic as an outpatient to set up elective colonoscopy. If hemoglobin drops or active bleeding occurs pt would need an inpatient colonoscopy if she is agreeable. At this time she would like to defer colonoscopy - Return to my office in 2 weeks.  02/21/20 Patient is here today in my office unaccompanied.  Patient drove herself.  She is very confused as to what happened in the hospital.  She was not certain what happened or what they did.  She is also uncertain of the medication that she is taking.  She does not know if she is taking her iron pill.  She does recognize the name Protonix but she is not certain if she is taking it twice a day.  She denies taking any NSAIDs.  She does occasionally drink a glass of wine.  I encouraged her to avoid all alcohol and NSAIDs due to the ulcer seen  in her stomach.  She denies any melena or bright red blood per rectum.  She denies any abdominal pain.  She has an appointment scheduled to see the gastroenterologist on the 21.  Colonoscopy has not been performed yet.   Past Medical History:  Diagnosis Date  . CHF (congestive heart failure) ([place])   . Dyslipidemia   . Frequent falls   . GERD (gastroesophageal reflux disease)   . HTN (hypertension)   . Memory loss   . PFO (patent foramen ovale)   . Retinal artery occlusion   . Weakness    Past Surgical History:  Procedure Laterality Date  . ANKLE ARTHROSCOPY WITH OPEN REDUCTION INTERNAL FIXATION (ORIF) Right   . APPENDECTOMY    . COLONOSCOPY N/A 07/21/2018   Procedure: COLONOSCOPY;  Surgeon: Lin Landsman, MD;  Location: [facility] ENDOSCOPY;  Service: Gastroenterology;  Laterality: N/A;  . ESOPHAGOGASTRODUODENOSCOPY N/A 07/21/2018   Procedure: ESOPHAGOGASTRODUODENOSCOPY (EGD);  Surgeon: Lin Landsman, MD;  Location: [facility]  ENDOSCOPY;  Service: Gastroenterology;  Laterality: N/A;  . ESOPHAGOGASTRODUODENOSCOPY N/A 02/11/2020   Procedure: ESOPHAGOGASTRODUODENOSCOPY (EGD);  Surgeon: Virgel Manifold, MD;  Location: [facility] ENDOSCOPY;  Service: Endoscopy;  Laterality: N/A;  . TUBAL LIGATION     Current Outpatient Medications on File Prior to Visit  Medication Sig Dispense Refill  . B Complex-Biotin-FA (B-COMPLEX PO) Take 1 capsule by mouth daily.     . Calcium Carbonate-Vitamin D (CALCIUM + D PO) Take 1 capsule by mouth 2 (two) times daily.     . Cholecalciferol (VITAMIN D) 2000 UNITS CAPS Take 1 capsule by mouth daily.     . dorzolamide (TRUSOPT) 2 % ophthalmic solution Place 1 drop into the left eye 2 (two) times daily.     . ferrous sulfate 325 (65 FE) MG EC tablet Take 1 tablet (325 mg total) by mouth 2 (two) times daily. 60 tablet 3  . fish oil-omega-3 fatty acids 1000 MG capsule Take 2 g by mouth 2 (two) times daily.      . hydrochlorothiazide (HYDRODIURIL) 25 MG tablet Take 1  tablet (25 mg total) by mouth daily. 90 tablet 3  . latanoprost (XALATAN) 0.005 % ophthalmic solution Place 1 drop into the left eye at bedtime.     Marland Kitchen lisinopril (ZESTRIL) 40 MG tablet Take 40 mg by mouth daily.    . melatonin 5 MG TABS Take 5 mg by mouth at bedtime.    . Multiple Vitamin (MULTIVITAMIN) tablet Take 1 tablet by mouth daily.      . pantoprazole (PROTONIX) 40 MG tablet Take 1 tablet (40 mg total) by mouth 2 (two) times daily. 60 tablet 1  . potassium chloride (KLOR-CON) 10 MEQ tablet TAKE 1 TABLET BY MOUTH  DAILY (Patient taking differently: Take 10 mEq by mouth daily. ) 90 tablet 3  . pravastatin (PRAVACHOL) 40 MG tablet Take 1 tablet (40 mg total) by mouth daily. 90 tablet 3  . SYMBICORT 160-4.5 MCG/ACT inhaler INHALE 2 PUFFS INTO THE LUNGS 2 TIMES DAILY (Patient taking differently: Inhale 2 puffs into the lungs 2 (two) times daily. ) 10.2 g 5  . traZODone (DESYREL) 50 MG tablet Take 1 tablet (50 mg total) by mouth at bedtime. 90 tablet 3  . Turmeric 500 MG CAPS Take 500 mg by mouth daily.     Marland Kitchen venlafaxine XR (EFFEXOR-XR) 75 MG 24 hr capsule TAKE 1 CAPSULE BY MOUTH ONCE DAILY WITH BREAKFAST 30 capsule 0  . verapamil (CALAN-SR) 240 MG CR tablet TAKE 1 TABLET BY MOUTH AT  BEDTIME (Patient taking differently: Take 240 mg by mouth at bedtime. ) 90 tablet 3  . vitamin E 180 MG (400 UNITS) capsule Take 180 mg by mouth daily.      No current facility-administered medications on file prior to visit.   Allergies  Allergen Reactions  . Oxycodone Itching   Social History   Socioeconomic History  . Marital status: Widowed    Spouse name: Not on file  . Number of children: 1  . Years of education: HS  . Highest education level: Not on file  Occupational History  . Occupation: Retired  Tobacco Use  . Smoking status: Former Smoker    Packs/day: 1.00    Years: 15.00    Pack years: 15.00    Types: Cigarettes    Quit date: 12/27/1980    Years since quitting: 39.1  . Smokeless  tobacco: Never Used  Substance and Sexual Activity  . Alcohol use: Yes  Comment: occassionally  . Drug use: No  . Sexual activity: Not Currently  Other Topics Concern  . Not on file  Social History Narrative   Lives at home alone.   Right-handed.   No daily use of caffeine.   Social Determinants of Health   Financial Resource Strain:   . Difficulty of Paying Living Expenses:   Food Insecurity:   . Worried About Charity fundraiser in the Last Year:   . Arboriculturist in the Last Year:   Transportation Needs:   . Film/video editor (Medical):   Marland Kitchen Lack of Transportation (Non-Medical):   Physical Activity:   . Days of Exercise per Week:   . Minutes of Exercise per Session:   Stress:   . Feeling of Stress :   Social Connections:   . Frequency of Communication with Friends and Family:   . Frequency of Social Gatherings with Friends and Family:   . Attends Religious Services:   . Active Member of Clubs or Organizations:   . Attends Archivist Meetings:   Marland Kitchen Marital Status:   Intimate Partner Violence:   . Fear of Current or Ex-Partner:   . Emotionally Abused:   Marland Kitchen Physically Abused:   . Sexually Abused:      Review of Systems  All other systems reviewed and are negative.      Objective:   Physical Exam Constitutional:      General: She is not in acute distress.    Appearance: She is not ill-appearing, toxic-appearing or diaphoretic.  HENT:     Mouth/Throat:     Mouth: Mucous membranes are moist.  Neck:     Vascular: No carotid bruit.  Cardiovascular:     Rate and Rhythm: Normal rate and regular rhythm.     Heart sounds: Normal heart sounds. No murmur heard.   Pulmonary:     Effort: Pulmonary effort is normal. No respiratory distress.     Breath sounds: No stridor. No wheezing, rhonchi or rales.  Chest:     Chest wall: No tenderness.  Musculoskeletal:     Right lower leg: No edema.     Left lower leg: No edema.  Neurological:     General:  No focal deficit present.     Mental Status: She is alert. Mental status is at baseline.     Cranial Nerves: Cranial nerves are intact.     Sensory: Sensation is intact.     Coordination: Coordination is intact.     Gait: Gait is intact.           Assessment & Plan:  Chronic fatigue - Plan: Iron, TIBC and Ferritin Panel, CBC with Differential/Platelet, BASIC METABOLIC PANEL WITH GFR  Iron deficiency anemia due to chronic blood loss - Plan: Iron, TIBC and Ferritin Panel, CBC with Differential/Platelet, BASIC METABOLIC PANEL WITH GFR  Patient is doing much better since her hospitalization after the transfusion.  However I am concerned that her dementia is worsening.  She seems more confused and has a difficult time answering questions.  I have previously told the patient I did not feel that she should be driving.  I also feel that she needs to have her medication supervised particularly as she is unable to answer my questions today about what she is taking and how she is taking it.  Therefore I encouraged her to allow her family to prepare her pillbox for her.  I also recommended that we start Aricept 10  mg a day to slow the progression of her dementia.  I will check a CBC today to ensure that her hemoglobin is not trending further.  If stable or improved, we can wait until the 21st to schedule her colonoscopy.  If worsening, she may need an inpatient colonoscopy.  Continue taking iron and Protonix twice daily.  Avoid all NSAIDs, aspirin, alcohol.

## 2020-02-24 ENCOUNTER — Other Ambulatory Visit: Payer: Self-pay

## 2020-02-24 ENCOUNTER — Telehealth: Payer: Self-pay | Admitting: Family Medicine

## 2020-02-24 NOTE — Progress Notes (Signed)
  Chronic Care Management   Note  02/24/2020 Name: Virginia Walters MRN: [id number] DOB: 11-12-1940  [name] is a 79 y.o. year old female who is a primary care patient of Pickard, Cammie Mcgee, MD. I reached out to [name] by phone today in response to a referral sent by [name]'s PCP, Susy Frizzle, MD.   [name] was given information about Chronic Care Management services today including:  1. CCM service includes personalized support from designated clinical staff supervised by her physician, including individualized plan of care and coordination with other care providers 2. 24/7 contact phone numbers for assistance for urgent and routine care needs. 3. Service will only be billed when office clinical staff spend 20 minutes or more in a month to coordinate care. 4. Only one practitioner may furnish and bill the service in a calendar month. 5. The patient may stop CCM services at any time (effective at the end of the month) by phone call to the office staff.   Patient agreed to services and verbal consent obtained.   Follow up plan:   [place]

## 2020-02-26 ENCOUNTER — Other Ambulatory Visit: Payer: Self-pay

## 2020-02-26 DIAGNOSIS — D500 Iron deficiency anemia secondary to blood loss (chronic): Secondary | ICD-10-CM

## 2020-02-27 DIAGNOSIS — J449 Chronic obstructive pulmonary disease, unspecified: Secondary | ICD-10-CM | POA: Diagnosis not present

## 2020-03-04 ENCOUNTER — Other Ambulatory Visit: Payer: Self-pay

## 2020-03-04 ENCOUNTER — Other Ambulatory Visit: Payer: Self-pay | Admitting: Family Medicine

## 2020-03-04 ENCOUNTER — Ambulatory Visit: Payer: Medicare Other | Admitting: Gastroenterology

## 2020-03-04 ENCOUNTER — Encounter: Payer: Self-pay | Admitting: Gastroenterology

## 2020-03-04 VITALS — BP 157/82 | HR 97 | Temp 97.6°F | Ht 65.0 in | Wt 218.0 lb

## 2020-03-04 DIAGNOSIS — D509 Iron deficiency anemia, unspecified: Secondary | ICD-10-CM

## 2020-03-04 MED ORDER — PANTOPRAZOLE SODIUM 40 MG PO TBEC: 40.0000 mg | DELAYED_RELEASE_TABLET | Freq: Two times a day (BID) | ORAL | 2 refills | Status: DC

## 2020-03-04 MED ORDER — NA SULFATE-K SULFATE-MG SULF 17.5-3.13-1.6 GM/177ML PO SOLN: ORAL | 0 refills | Status: DC

## 2020-03-04 NOTE — Progress Notes (Signed)
Virginia Antigua, MD [address]  [place]  Virginia Walters, [place] [address]  Main: [phone]  Fax: [phone]   Primary Care Physician: Susy Frizzle, MD   Chief Complaint  Patient presents with  . New Patient (Initial Visit)  . iron deficiency anemia    HPI: Virginia Walters is a 79 y.o. female here for follow-up of iron deficiency anemia.  EGD showed nonbleeding gastric ulcers.  Colonoscopy was offered during the hospital stay but patient refused.  Now she is stating her last colonoscopy was around 16 years ago in [place].  Hemoglobin has improved, but is still low.  Patient compliant with her PPI. The patient denies abdominal or flank pain, anorexia, nausea or vomiting, dysphagia, change in bowel habits or black or bloody stools or weight loss.   Current Outpatient Medications  Medication Sig Dispense Refill  . Calcium Carbonate-Vitamin D (CALCIUM + D PO) Take 1 capsule by mouth 2 (two) times daily.     . Cholecalciferol (VITAMIN D) 2000 UNITS CAPS Take 1 capsule by mouth daily.     Marland Kitchen donepezil (ARICEPT) 10 MG tablet Take 1 tablet (10 mg total) by mouth at bedtime. 30 tablet 11  . dorzolamide (TRUSOPT) 2 % ophthalmic solution Place 1 drop into the left eye 2 (two) times daily.     . ferrous sulfate 325 (65 FE) MG EC tablet Take 1 tablet (325 mg total) by mouth 2 (two) times daily. 60 tablet 11  . fish oil-omega-3 fatty acids 1000 MG capsule Take 2 g by mouth 2 (two) times daily.      . hydrochlorothiazide (HYDRODIURIL) 25 MG tablet Take 1 tablet (25 mg total) by mouth daily. 90 tablet 3  . latanoprost (XALATAN) 0.005 % ophthalmic solution Place 1 drop into the left eye at bedtime.     Marland Kitchen lisinopril (ZESTRIL) 40 MG tablet Take 40 mg by mouth daily.    . melatonin 5 MG TABS Take 5 mg by mouth at bedtime.    . Multiple Vitamin (MULTIVITAMIN) tablet Take 1 tablet by mouth daily.      . pantoprazole (PROTONIX) 40 MG tablet Take 1 tablet (40 mg total) by mouth 2 (two)  times daily. 60 tablet 1  . potassium chloride (KLOR-CON) 10 MEQ tablet TAKE 1 TABLET BY MOUTH  DAILY (Patient taking differently: Take 10 mEq by mouth daily. ) 90 tablet 3  . pravastatin (PRAVACHOL) 40 MG tablet Take 1 tablet (40 mg total) by mouth daily. 90 tablet 3  . traZODone (DESYREL) 50 MG tablet Take 1 tablet (50 mg total) by mouth at bedtime. 90 tablet 3  . Turmeric 500 MG CAPS Take 500 mg by mouth daily.     Marland Kitchen venlafaxine XR (EFFEXOR-XR) 75 MG 24 hr capsule TAKE 1 CAPSULE BY MOUTH ONCE DAILY WITH BREAKFAST 30 capsule 0  . verapamil (CALAN-SR) 240 MG CR tablet TAKE 1 TABLET BY MOUTH AT  BEDTIME (Patient taking differently: Take 240 mg by mouth at bedtime. ) 90 tablet 3  . vitamin E 180 MG (400 UNITS) capsule Take 180 mg by mouth daily.     . B Complex-Biotin-FA (B-COMPLEX PO) Take 1 capsule by mouth daily.  (Patient not taking: Reported on 03/04/2020)    . Na Sulfate-K Sulfate-Mg Sulf 17.5-3.13-1.6 GM/177ML SOLN At 5 PM the day before procedure take 1 bottle and 5 hours before procedure take 1 bottle. 354 mL 0  . SYMBICORT 160-4.5 MCG/ACT inhaler USE 2 INHALATIONS BY MOUTH  TWICE  DAILY 30.6 g 3   No current facility-administered medications for this visit.    Allergies as of 03/04/2020 - Review Complete 03/04/2020  Allergen Reaction Noted  . Oxycodone Itching 12/30/2014    ROS:  General: Negative for anorexia, weight loss, fever, chills, fatigue, weakness. ENT: Negative for hoarseness, difficulty swallowing , nasal congestion. CV: Negative for chest pain, angina, palpitations, dyspnea on exertion, peripheral edema.  Respiratory: Negative for dyspnea at rest, dyspnea on exertion, cough, sputum, wheezing.  GI: See history of present illness. GU:  Negative for dysuria, hematuria, urinary incontinence, urinary frequency, nocturnal urination.  Endo: Negative for unusual weight change.    Physical Examination:   BP (!) 157/82   Pulse 97   Temp 97.6 F (36.4 C) (Oral)   Ht 5\' 5"   (1.651 m)   Wt 218 lb (98.9 kg)   BMI 36.28 kg/m   General: Well-nourished, well-developed in no acute distress.  Eyes: No icterus. Conjunctivae pink. Mouth: Oropharyngeal mucosa moist and pink , no lesions erythema or exudate. Neck: Supple, Trachea midline Abdomen: Bowel sounds are normal, nontender, nondistended, no hepatosplenomegaly or masses, no abdominal bruits or hernia , no rebound or guarding.   Extremities: No lower extremity edema. No clubbing or deformities. Neuro: Alert and oriented x 3.  Grossly intact. Skin: Warm and dry, no jaundice.   Psych: Alert and cooperative, normal mood and affect.   Labs: CMP     Component Value Date/Time   NA 143 02/21/2020 1228   K 4.0 02/21/2020 1228   K 4.2 07/22/2013 1325   CL 105 02/21/2020 1228   CO2 26 02/21/2020 1228   GLUCOSE 118 (H) 02/21/2020 1228   BUN 20 02/21/2020 1228   BUN 26 (A) 08/24/2009 0000   CREATININE 1.38 (H) 02/21/2020 1228   CALCIUM 10.1 02/21/2020 1228   PROT 6.7 02/07/2020 1557   PROT 6.7 10/12/2016 1307   ALBUMIN 3.8 07/20/2018 1011   AST 12 02/07/2020 1557   ALT 11 02/07/2020 1557   ALKPHOS 66 07/20/2018 1011   BILITOT 0.3 02/07/2020 1557   GFRNONAA 37 (L) 02/21/2020 1228   GFRAA 42 (L) 02/21/2020 1228   Lab Results  Component Value Date   WBC 8.0 02/21/2020   HGB 9.0 (L) 02/21/2020   HCT 30.3 (L) 02/21/2020   MCV 74.8 (L) 02/21/2020   PLT 342 02/21/2020    Imaging Studies: No results found.  Assessment and Plan:   Virginia Walters is a 79 y.o. y/o female with iron deficiency anemia  Due to continued iron deficiency anemia despite PPI treatment, and no recent colonoscopies in the last 13 to 16 years, colonoscopy would help rule out malignancy causing her iron deficiency anemia  I have discussed alternative options, risks & benefits,  which include, but are not limited to, bleeding, infection, perforation,respiratory complication & drug reaction.  The patient agrees with this plan &  written consent will be obtained.    Alternative options of conservative management were discussed in detail, including but not limited to medication management, foregoing endoscopic procedures at this time and others.    Repeated EGD indicated for gastric biopsies as well due to recent gastric ulcers and inability to obtain biopsies on last procedure due to acute anemia   Dr Virginia Walters

## 2020-03-04 NOTE — Addendum Note (Signed)
Addended by: Wayna Chalet on: 03/04/2020 05:56 PM   Modules accepted: Orders

## 2020-03-05 ENCOUNTER — Other Ambulatory Visit: Payer: Self-pay | Admitting: *Deleted

## 2020-03-05 MED ORDER — HYDROCHLOROTHIAZIDE 25 MG PO TABS: 25.0000 mg | ORAL_TABLET | Freq: Every day | ORAL | 3 refills | Status: DC

## 2020-03-10 ENCOUNTER — Other Ambulatory Visit: Payer: Self-pay

## 2020-03-10 MED ORDER — DONEPEZIL HCL 10 MG PO TABS: 10.0000 mg | ORAL_TABLET | Freq: Every day | ORAL | 11 refills | Status: DC

## 2020-03-29 DIAGNOSIS — I509 Heart failure, unspecified: Secondary | ICD-10-CM | POA: Diagnosis not present

## 2020-03-29 DIAGNOSIS — J449 Chronic obstructive pulmonary disease, unspecified: Secondary | ICD-10-CM | POA: Diagnosis not present

## 2020-03-30 ENCOUNTER — Other Ambulatory Visit
Admission: RE | Admit: 2020-03-30 | Discharge: 2020-03-30 | Disposition: A | Discharge status: Home / Self Care | Payer: Medicare Other | Source: Ambulatory Visit | Attending: Gastroenterology | Admitting: Gastroenterology

## 2020-03-30 ENCOUNTER — Telehealth: Payer: Self-pay

## 2020-03-30 ENCOUNTER — Other Ambulatory Visit: Payer: Self-pay

## 2020-03-30 DIAGNOSIS — Z20822 Contact with and (suspected) exposure to covid-19: Secondary | ICD-10-CM | POA: Insufficient documentation

## 2020-03-30 DIAGNOSIS — Z01812 Encounter for preprocedural laboratory examination: Secondary | ICD-10-CM | POA: Insufficient documentation

## 2020-03-30 LAB — SARS CORONAVIRUS 2 (TAT 6-24 HRS): SARS Coronavirus 2: NEGATIVE

## 2020-03-30 NOTE — Telephone Encounter (Signed)
Fenda from [facility] called and left me a voicemail letting me know that patient's insurance is requiring prior-authorization for patient's upcoming procedure on 04/01/2020. She wanted for me to request it and then call her back with the authorization number at [phone] Ext# [id number]. I then reached out to Traci since she is the right person for this message. I sent her a message thru secure chat.Traci then responded back and stated that she will take care of it and then call Fenda from [facility].

## 2020-04-01 ENCOUNTER — Other Ambulatory Visit: Payer: Self-pay

## 2020-04-01 ENCOUNTER — Ambulatory Visit: Payer: Medicare Other | Admitting: Anesthesiology

## 2020-04-01 ENCOUNTER — Encounter: Payer: Self-pay | Admitting: Gastroenterology

## 2020-04-01 ENCOUNTER — Encounter
Admission: RE | Disposition: A | Discharge status: Home / Self Care | Payer: Self-pay | Source: Home / Self Care | Attending: Gastroenterology

## 2020-04-01 ENCOUNTER — Ambulatory Visit
Admission: RE | Admit: 2020-04-01 | Discharge: 2020-04-01 | Disposition: A | Discharge status: Home / Self Care | Payer: Medicare Other | Attending: Gastroenterology | Admitting: Gastroenterology

## 2020-04-01 DIAGNOSIS — Q211 Atrial septal defect: Secondary | ICD-10-CM | POA: Insufficient documentation

## 2020-04-01 DIAGNOSIS — I509 Heart failure, unspecified: Secondary | ICD-10-CM | POA: Insufficient documentation

## 2020-04-01 DIAGNOSIS — Z7951 Long term (current) use of inhaled steroids: Secondary | ICD-10-CM | POA: Insufficient documentation

## 2020-04-01 DIAGNOSIS — I110 Hypertensive heart disease with heart failure: Secondary | ICD-10-CM | POA: Insufficient documentation

## 2020-04-01 DIAGNOSIS — Q402 Other specified congenital malformations of stomach: Secondary | ICD-10-CM | POA: Diagnosis not present

## 2020-04-01 DIAGNOSIS — D509 Iron deficiency anemia, unspecified: Secondary | ICD-10-CM | POA: Insufficient documentation

## 2020-04-01 DIAGNOSIS — Z9049 Acquired absence of other specified parts of digestive tract: Secondary | ICD-10-CM | POA: Diagnosis not present

## 2020-04-01 DIAGNOSIS — K449 Diaphragmatic hernia without obstruction or gangrene: Secondary | ICD-10-CM | POA: Insufficient documentation

## 2020-04-01 DIAGNOSIS — J449 Chronic obstructive pulmonary disease, unspecified: Secondary | ICD-10-CM | POA: Insufficient documentation

## 2020-04-01 DIAGNOSIS — K317 Polyp of stomach and duodenum: Secondary | ICD-10-CM | POA: Diagnosis not present

## 2020-04-01 DIAGNOSIS — Z8673 Personal history of transient ischemic attack (TIA), and cerebral infarction without residual deficits: Secondary | ICD-10-CM | POA: Diagnosis not present

## 2020-04-01 DIAGNOSIS — E785 Hyperlipidemia, unspecified: Secondary | ICD-10-CM | POA: Insufficient documentation

## 2020-04-01 DIAGNOSIS — K3189 Other diseases of stomach and duodenum: Secondary | ICD-10-CM

## 2020-04-01 DIAGNOSIS — Z8711 Personal history of peptic ulcer disease: Secondary | ICD-10-CM | POA: Diagnosis not present

## 2020-04-01 DIAGNOSIS — Z87891 Personal history of nicotine dependence: Secondary | ICD-10-CM | POA: Insufficient documentation

## 2020-04-01 DIAGNOSIS — K5790 Diverticulosis of intestine, part unspecified, without perforation or abscess without bleeding: Secondary | ICD-10-CM | POA: Diagnosis not present

## 2020-04-01 DIAGNOSIS — D120 Benign neoplasm of cecum: Secondary | ICD-10-CM | POA: Insufficient documentation

## 2020-04-01 DIAGNOSIS — K219 Gastro-esophageal reflux disease without esophagitis: Secondary | ICD-10-CM | POA: Insufficient documentation

## 2020-04-01 DIAGNOSIS — K5520 Angiodysplasia of colon without hemorrhage: Secondary | ICD-10-CM | POA: Insufficient documentation

## 2020-04-01 DIAGNOSIS — K648 Other hemorrhoids: Secondary | ICD-10-CM | POA: Diagnosis not present

## 2020-04-01 DIAGNOSIS — K5730 Diverticulosis of large intestine without perforation or abscess without bleeding: Secondary | ICD-10-CM | POA: Insufficient documentation

## 2020-04-01 DIAGNOSIS — Z79899 Other long term (current) drug therapy: Secondary | ICD-10-CM | POA: Insufficient documentation

## 2020-04-01 DIAGNOSIS — K2970 Gastritis, unspecified, without bleeding: Secondary | ICD-10-CM | POA: Diagnosis not present

## 2020-04-01 DIAGNOSIS — K635 Polyp of colon: Secondary | ICD-10-CM

## 2020-04-01 HISTORY — PX: ESOPHAGOGASTRODUODENOSCOPY (EGD) WITH PROPOFOL: SHX5813

## 2020-04-01 HISTORY — PX: COLONOSCOPY WITH PROPOFOL: SHX5780

## 2020-04-01 SURGERY — ESOPHAGOGASTRODUODENOSCOPY (EGD) WITH PROPOFOL
Anesthesia: General

## 2020-04-01 MED ORDER — GLYCOPYRROLATE 0.2 MG/ML IJ SOLN
INTRAMUSCULAR | Status: DC | PRN
  Administered 2020-04-01: .2 mg via INTRAVENOUS

## 2020-04-01 MED ORDER — SODIUM CHLORIDE 0.9 % IV SOLN
INTRAVENOUS | Status: DC
  Administered 2020-04-01: 1000 mL via INTRAVENOUS

## 2020-04-01 MED ORDER — PROPOFOL 10 MG/ML IV BOLUS
INTRAVENOUS | Status: DC | PRN
  Administered 2020-04-01: 50 mg via INTRAVENOUS

## 2020-04-01 MED ORDER — PROPOFOL 500 MG/50ML IV EMUL
INTRAVENOUS | Status: DC | PRN
  Administered 2020-04-01: 145 ug/kg/min via INTRAVENOUS

## 2020-04-01 MED ORDER — LIDOCAINE HCL (CARDIAC) PF 100 MG/5ML IV SOSY
PREFILLED_SYRINGE | INTRAVENOUS | Status: DC | PRN
  Administered 2020-04-01: 100 mg via INTRAVENOUS

## 2020-04-01 NOTE — Op Note (Signed)
[facility] Gastroenterology Patient Name: Aden Youngman Procedure Date: 04/01/2020 9:33 AM MRN: [id number] Account #: [id number] Date of Birth: 02-03-1941 Admit Type: Outpatient Age: 79 Room: [facility] ENDO ROOM 4 Gender: Female Note Status: Finalized Procedure:             Colonoscopy Indications:           Iron deficiency anemia Providers:             Solomiya Pascale B. Bonna Gains MD, MD Referring MD:          Eula Flax (Referring MD) Medicines:             Monitored Anesthesia Care Complications:         No immediate complications. Procedure:             Pre-Anesthesia Assessment:                        - ASA Grade Assessment: II - A patient with mild                         systemic disease.                        - Prior to the procedure, a History and Physical was                         performed, and patient medications, allergies and                         sensitivities were reviewed. The patient's tolerance                         of previous anesthesia was reviewed.                        - The risks and benefits of the procedure and the                         sedation options and risks were discussed with the                         patient. All questions were answered and informed                         consent was obtained.                        - Patient identification and proposed procedure were                         verified prior to the procedure by the physician, the                         nurse, the anesthesiologist, the anesthetist and the                         technician. The procedure was verified in the                         procedure room.  After obtaining informed consent, the colonoscope was                         passed under direct vision. Throughout the procedure,                         the patient's blood pressure, pulse, and oxygen                         saturations were monitored continuously. The                          Colonoscope was introduced through the anus and                         advanced to the the cecum, identified by appendiceal                         orifice and ileocecal valve. The colonoscopy was                         performed with ease. The patient tolerated the                         procedure well. The quality of the bowel preparation                         was good. Findings:      The perianal and digital rectal examinations were normal.      Two flat and sessile polyps were found in the cecum. The polyps were 3       to 4 mm in size. These polyps were removed with a jumbo cold forceps.       Resection and retrieval were complete.      A few angioectasias without bleeding were found in the ascending colon.       Coagulation for bleeding prevention using argon plasma was successful.      Multiple (more in the sigmoid colon than other parts of the colon)       diverticula were found in the entire colon.      The exam was otherwise without abnormality.      The rectum, sigmoid colon, descending colon, transverse colon, ascending       colon and cecum appeared normal.      Non-bleeding internal hemorrhoids were found during retroflexion. The       hemorrhoids were large.      No additional abnormalities were found on retroflexion. Impression:            - Two 3 to 4 mm polyps in the cecum, removed with a                         jumbo cold forceps. Resected and retrieved.                        - A few non-bleeding colonic angioectasias. Treated                         with argon plasma coagulation (APC).                        -  Diverticulosis in the entire examined colon.                        - The examination was otherwise normal.                        - The rectum, sigmoid colon, descending colon,                         transverse colon, ascending colon and cecum are normal.                        - Non-bleeding internal hemorrhoids.                         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 Recommendation:        - Discharge patient to home (with escort).                        - High fiber diet.                        - Advance diet as tolerated.                        - Continue present medications.                        - Await pathology results.                        - Repeat colonoscopy date to be determined after                         pending pathology results are reviewed.                        - The findings and recommendations were discussed with                         the patient.                        - The findings and recommendations were discussed with                         the patient's family.                        - Return to primary care physician as previously                         scheduled.                        -  Return to my office in 4 weeks. Procedure Code(s):     --- Professional ---                        [phone], 44, Colonoscopy, flexible; with control of                         bleeding, any method                        45380, Colonoscopy, flexible; with biopsy, single or                         multiple Diagnosis Code(s):     --- Professional ---                        K63.5, Polyp of colon                        K55.20, Angiodysplasia of colon without hemorrhage                        D50.9, Iron deficiency anemia, unspecified CPT copyright 2019 American Medical Association. All rights reserved. The codes documented in this report are preliminary and upon coder review may  be revised to meet current compliance requirements.  [name], MD Margretta Sidle B. Bonna Gains MD, MD 04/01/2020 10:33:43 AM This report has been signed electronically. Number of Addenda: 0 Note Initiated On: 04/01/2020 9:33 AM Scope Withdrawal Time: 0 hours 21  minutes 16 seconds  Total Procedure Duration: 0 hours 27 minutes 24 seconds  Estimated Blood Loss:  Estimated blood loss: none.      [facility]

## 2020-04-01 NOTE — Transfer of Care (Signed)
Immediate Anesthesia Transfer of Care Note  [name]  Procedure(s) Performed: ESOPHAGOGASTRODUODENOSCOPY (EGD) WITH PROPOFOL (N/A ) COLONOSCOPY WITH PROPOFOL (N/A )  Patient Location: Endoscopy Unit  Anesthesia Type:General  Level of Consciousness: sedated  Airway & Oxygen Therapy: Patient Spontanous Breathing and Patient connected to nasal cannula oxygen  Post-op Assessment: Report given to RN and Post -op Vital signs reviewed and stable  Post vital signs: Reviewed and stable  Last Vitals:  Vitals Value Taken Time  BP 146/127 04/01/20 1058  Temp 36.5 C 04/01/20 1028  Pulse 95 04/01/20 1059  Resp 24 04/01/20 1059  SpO2 94 % 04/01/20 1059  Vitals shown include unvalidated device data.  Last Pain:  Vitals:   04/01/20 1058  TempSrc:   PainSc: 0-No pain         Complications: No complications documented.

## 2020-04-01 NOTE — Anesthesia Procedure Notes (Signed)
Procedure Name: General with mask airway Performed by: Fletcher-Harrison, Abrie Egloff, CRNA Pre-anesthesia Checklist: Patient identified, Emergency Drugs available, Suction available and Patient being monitored Patient Re-evaluated:Patient Re-evaluated prior to induction Oxygen Delivery Method: Simple face mask Induction Type: IV induction Placement Confirmation: positive ETCO2 and CO2 detector Dental Injury: Teeth and Oropharynx as per pre-operative assessment        

## 2020-04-01 NOTE — Anesthesia Postprocedure Evaluation (Signed)
Anesthesia Post Note  [name]  Procedure(s) Performed: ESOPHAGOGASTRODUODENOSCOPY (EGD) WITH PROPOFOL (N/A ) COLONOSCOPY WITH PROPOFOL (N/A )  Patient location during evaluation: Endoscopy Anesthesia Type: General Level of consciousness: awake and alert Pain management: pain level controlled Vital Signs Assessment: post-procedure vital signs reviewed and stable Respiratory status: spontaneous breathing, nonlabored ventilation, respiratory function stable and patient connected to nasal cannula oxygen Cardiovascular status: blood pressure returned to baseline and stable Postop Assessment: no apparent nausea or vomiting Anesthetic complications: no   No complications documented.   Last Vitals:  Vitals:   04/01/20 1048 04/01/20 1058  BP: (!) 151/88 (!) 146/87  Pulse:    Resp:    Temp:    SpO2:      Last Pain:  Vitals:   04/01/20 1058  TempSrc:   PainSc: 0-No pain                 Virginia Walters

## 2020-04-01 NOTE — H&P (Signed)
[name], MD [address], [place], [place], [place], [address] [address], [place], [place], [place], [address] Phone: [phone]  Fax: [phone]  Primary Care Physician:  Susy Frizzle, MD   Pre-Procedure History & Physical: HPI:  Virginia Walters is a 79 y.o. female is here for a colonoscopy and EGD.   Past Medical History:  Diagnosis Date  . CHF (congestive heart failure) ([place])   . Dyslipidemia   . Frequent falls   . GERD (gastroesophageal reflux disease)   . HTN (hypertension)   . Memory loss   . PFO (patent foramen ovale)   . Retinal artery occlusion   . Weakness     Past Surgical History:  Procedure Laterality Date  . ANKLE ARTHROSCOPY WITH OPEN REDUCTION INTERNAL FIXATION (ORIF) Right   . APPENDECTOMY    . COLONOSCOPY N/A 07/21/2018   Procedure: COLONOSCOPY;  Surgeon: Lin Landsman, MD;  Location: [facility] ENDOSCOPY;  Service: Gastroenterology;  Laterality: N/A;  . ESOPHAGOGASTRODUODENOSCOPY N/A 07/21/2018   Procedure: ESOPHAGOGASTRODUODENOSCOPY (EGD);  Surgeon: Lin Landsman, MD;  Location: [facility] ENDOSCOPY;  Service: Gastroenterology;  Laterality: N/A;  . ESOPHAGOGASTRODUODENOSCOPY N/A 02/11/2020   Procedure: ESOPHAGOGASTRODUODENOSCOPY (EGD);  Surgeon: Virgel Manifold, MD;  Location: [facility] ENDOSCOPY;  Service: Endoscopy;  Laterality: N/A;  . TUBAL LIGATION      Prior to Admission medications   Medication Sig Start Date End Date Taking? Authorizing Provider  B Complex-Biotin-FA (B-COMPLEX PO) Take 1 capsule by mouth daily.    Yes [provider]  Calcium Carbonate-Vitamin D (CALCIUM + D PO) Take 1 capsule by mouth 2 (two) times daily.    Yes [provider]  Cholecalciferol (VITAMIN D) 2000 UNITS CAPS Take 1 capsule by mouth daily.    Yes [provider]  donepezil (ARICEPT) 10 MG tablet Take 1 tablet (10 mg total) by mouth at bedtime. 03/10/20  Yes Susy Frizzle, MD  dorzolamide (TRUSOPT) 2 %  ophthalmic solution Place 1 drop into the left eye 2 (two) times daily.    Yes [provider]  ferrous sulfate 325 (65 FE) MG EC tablet Take 1 tablet (325 mg total) by mouth 2 (two) times daily. 02/21/20 02/20/21 Yes PickardCammie Mcgee, MD  fish oil-omega-3 fatty acids 1000 MG capsule Take 2 g by mouth 2 (two) times daily.     Yes [provider]  hydrochlorothiazide (HYDRODIURIL) 25 MG tablet Take 1 tablet (25 mg total) by mouth daily. 03/05/20  Yes Susy Frizzle, MD  latanoprost (XALATAN) 0.005 % ophthalmic solution Place 1 drop into the left eye at bedtime.  12/05/17  Yes [provider]  lisinopril (ZESTRIL) 40 MG tablet Take 40 mg by mouth daily.   Yes [provider]  melatonin 5 MG TABS Take 5 mg by mouth at bedtime.   Yes [provider]  Multiple Vitamin (MULTIVITAMIN) tablet Take 1 tablet by mouth daily.     Yes [provider]  Na Sulfate-K Sulfate-Mg Sulf 17.5-3.13-1.6 GM/177ML SOLN At 5 PM the day before procedure take 1 bottle and 5 hours before procedure take 1 bottle. 03/04/20  Yes Virgel Manifold, MD  pantoprazole (PROTONIX) 40 MG tablet Take 1 tablet (40 mg total) by mouth 2 (two) times daily. 03/04/20  Yes [name] B, MD  potassium chloride (KLOR-CON) 10 MEQ tablet TAKE 1 TABLET BY MOUTH  DAILY Patient taking differently: Take 10 mEq by mouth daily.  12/24/19  Yes Susy Frizzle, MD  pravastatin (PRAVACHOL)  40 MG tablet Take 1 tablet (40 mg total) by mouth daily. 01/30/20  Yes Susy Frizzle, MD  SYMBICORT 160-4.5 MCG/ACT inhaler USE 2 INHALATIONS BY MOUTH  TWICE DAILY 03/04/20  Yes Susy Frizzle, MD  traZODone (DESYREL) 50 MG tablet Take 1 tablet (50 mg total) by mouth at bedtime. 01/31/20  Yes Susy Frizzle, MD  Turmeric 500 MG CAPS Take 500 mg by mouth daily.    Yes [provider]  venlafaxine XR (EFFEXOR-XR) 75 MG 24 hr capsule TAKE 1 CAPSULE BY MOUTH ONCE DAILY WITH BREAKFAST 02/20/20  Yes  Bates, Crystal A, FNP  verapamil (CALAN-SR) 240 MG CR tablet TAKE 1 TABLET BY MOUTH AT  BEDTIME Patient taking differently: Take 240 mg by mouth at bedtime.  11/11/19  Yes Susy Frizzle, MD  vitamin E 180 MG (400 UNITS) capsule Take 180 mg by mouth daily.    Yes [provider]    Allergies as of 03/05/2020 - Review Complete 03/04/2020  Allergen Reaction Noted  . Oxycodone Itching 12/30/2014    Family History  Problem Relation Age of Onset  . Esophageal cancer Father   . Dementia Mother     Social History   Socioeconomic History  . Marital status: Widowed    Spouse name: Not on file  . Number of children: 1  . Years of education: HS  . Highest education level: Not on file  Occupational History  . Occupation: Retired  Tobacco Use  . Smoking status: Former Smoker    Packs/day: 1.00    Years: 15.00    Pack years: 15.00    Types: Cigarettes    Quit date: 12/27/1980    Years since quitting: 39.2  . Smokeless tobacco: Never Used  Vaping Use  . Vaping Use: Never used  Substance and Sexual Activity  . Alcohol use: Yes    Comment: occassionally  . Drug use: No  . Sexual activity: Not Currently  Other Topics Concern  . Not on file  Social History Narrative   Lives at home alone.   Right-handed.   No daily use of caffeine.   Social Determinants of Health   Financial Resource Strain:   . Difficulty of Paying Living Expenses:   Food Insecurity:   . Worried About Charity fundraiser in the Last Year:   . Arboriculturist in the Last Year:   Transportation Needs:   . Film/video editor (Medical):   Marland Kitchen Lack of Transportation (Non-Medical):   Physical Activity:   . Days of Exercise per Week:   . Minutes of Exercise per Session:   Stress:   . Feeling of Stress :   Social Connections:   . Frequency of Communication with Friends and Family:   . Frequency of Social Gatherings with Friends and Family:   . Attends Religious Services:   . Active Member of  Clubs or Organizations:   . Attends Archivist Meetings:   Marland Kitchen Marital Status:   Intimate Partner Violence:   . Fear of Current or Ex-Partner:   . Emotionally Abused:   Marland Kitchen Physically Abused:   . Sexually Abused:     Review of Systems: See HPI, otherwise negative ROS  Physical Exam: BP (!) 157/101   Pulse 96   Temp 97.8 F (36.6 C) (Temporal)   Resp 19   Ht 5\' 7"  (1.702 m)   Wt 109.8 kg   SpO2 96%   BMI 37.90 kg/m  General:  Alert,  pleasant and cooperative in NAD Head:  Normocephalic and atraumatic. Neck:  Supple; no masses or thyromegaly. Lungs:  Clear throughout to auscultation, normal respiratory effort.    Heart:  +S1, +S2, Regular rate and rhythm, No edema. Abdomen:  Soft, nontender and nondistended. Normal bowel sounds, without guarding, and without rebound.   Neurologic:  Alert and  oriented x4;  grossly normal neurologically.  Impression/Plan: Virginia Walters is here for a colonoscopy and EGD for iron deficiency anemia  Risks, benefits, limitations, and alternatives regarding the procedures have been reviewed with the patient.  Questions have been answered.  All parties agreeable.   Virgel Manifold, MD  04/01/2020, 9:35 AM

## 2020-04-01 NOTE — Chronic Care Management (AMB) (Addendum)
Chronic Care Management Pharmacy  Name: Virginia Walters  MRN: [id number] DOB: Apr 23, 1941  Chief Complaint/ HPI  Virginia Walters,  79 y.o. , female presents for their Initial CCM visit with the clinical pharmacist In office.  PCP : Susy Frizzle, MD  Their chronic conditions include: HTN, Dyslipidemia, GERD, Dementia, COPD, CKD, anemia.  Office Visits:  02/21/2020 ([place]) -  Hemoglobin was 6.1 she was referred to ER PCP recommended she have family pack pill box Avoid all NSAIDs and alcohol Started donepezil bid  Consult Visit: 03/04/2020 Bonna Gains, GI) EGD showed non bleeding gastric ulcers Compliant with PPI Recommend colonoscopy for diagnosis  Medications: Outpatient Encounter Medications as of 04/02/2020  Medication Sig   B Complex-Biotin-FA (B-COMPLEX PO) Take 1 capsule by mouth daily.    Calcium Carbonate-Vitamin D (CALCIUM + D PO) Take 1 capsule by mouth 2 (two) times daily.    Cholecalciferol (VITAMIN D) 2000 UNITS CAPS Take 1 capsule by mouth daily.    dorzolamide (TRUSOPT) 2 % ophthalmic solution Place 1 drop into the left eye 2 (two) times daily.    ferrous sulfate 325 (65 FE) MG EC tablet Take 1 tablet (325 mg total) by mouth 2 (two) times daily.   fish oil-omega-3 fatty acids 1000 MG capsule Take 2 g by mouth 2 (two) times daily.     hydrochlorothiazide (HYDRODIURIL) 25 MG tablet Take 1 tablet (25 mg total) by mouth daily.   latanoprost (XALATAN) 0.005 % ophthalmic solution Place 1 drop into the left eye at bedtime.    lisinopril (ZESTRIL) 40 MG tablet Take 40 mg by mouth daily.   melatonin 5 MG TABS Take 5 mg by mouth at bedtime.   Multiple Vitamin (MULTIVITAMIN) tablet Take 1 tablet by mouth daily.     Na Sulfate-K Sulfate-Mg Sulf 17.5-3.13-1.6 GM/177ML SOLN At 5 PM the day before procedure take 1 bottle and 5 hours before procedure take 1 bottle.   pantoprazole (PROTONIX) 40 MG tablet Take 1 tablet (40 mg total) by mouth 2 (two) times daily.    potassium chloride (KLOR-CON) 10 MEQ tablet TAKE 1 TABLET BY MOUTH  DAILY (Patient taking differently: Take 10 mEq by mouth daily. )   pravastatin (PRAVACHOL) 40 MG tablet Take 1 tablet (40 mg total) by mouth daily.   SYMBICORT 160-4.5 MCG/ACT inhaler USE 2 INHALATIONS BY MOUTH  TWICE DAILY   traZODone (DESYREL) 50 MG tablet Take 1 tablet (50 mg total) by mouth at bedtime.   Turmeric 500 MG CAPS Take 500 mg by mouth daily.    venlafaxine XR (EFFEXOR-XR) 75 MG 24 hr capsule TAKE 1 CAPSULE BY MOUTH ONCE DAILY WITH BREAKFAST   verapamil (CALAN-SR) 240 MG CR tablet TAKE 1 TABLET BY MOUTH AT  BEDTIME (Patient taking differently: Take 240 mg by mouth at bedtime. )   vitamin E 180 MG (400 UNITS) capsule Take 180 mg by mouth daily.    donepezil (ARICEPT) 10 MG tablet Take 1 tablet (10 mg total) by mouth at bedtime. (Patient not taking: Reported on 04/02/2020)   [DISCONTINUED] 0.9 %  sodium chloride infusion    [DISCONTINUED] glycopyrrolate (ROBINUL) injection    [DISCONTINUED] lidocaine (cardiac) 100 mg/58m (XYLOCAINE) injection 2%    [DISCONTINUED] propofol (DIPRIVAN) 10 mg/mL bolus/IV push    [DISCONTINUED] propofol (DIPRIVAN) 500 MG/50ML infusion    No facility-administered encounter medications on file as of 04/02/2020.     Current Diagnosis/Assessment:  Goals Addressed  This Visit's Progress    Pharmacy Care Plan:       CARE PLAN ENTRY (see longitudinal plan of care for additional care plan information)  Current Barriers:  Chronic Disease Management support, education, and care coordination needs related to Hypertension, Hyperlipidemia, COPD, and anemia , dementia.   Hypertension BP Readings from Last 3 Encounters:  04/01/20 (!) 146/87  03/04/20 (!) 157/82  02/21/20 120/70   Pharmacist Clinical Goal(s): Over the next 180 days, patient will work with PharmD and providers to achieve BP goal <140/90 Current regimen:  Lisinopril 58m HCTZ 219mdaily Verapamil 24072mhs Interventions: Reviewed home monitoring  Recommended patient monitor BP at home Patient self care activities - Over the next 180 days, patient will: Check BP periodically, document, and provide at future appointments Ensure daily salt intake < 2300 mg/day Contact providers with BP consistently > 140/90  Hyperlipidemia Lab Results  Component Value Date/Time   [facility] 03/06/2017 09:11 AM   Pharmacist Clinical Goal(s): Over the next 180 days, patient will work with PharmD and providers to maintain LDL goal < 100 Current regimen:  Pravastatin 61m60mterventions: Reviewed most recent lipid panel. Counseled on importance of medication adherence. Patient self care activities - Over the next 180 days, patient will: Continue to focus on medication adherence by pill box  Anemia Pharmacist Clinical Goal(s) Over the next 180 days, patient will work with PharmD and providers to optimize medication related to anemia. Current regimen:  Ferrous sulfate 325mg21mce daily Interventions: Discussed OTC iron and Rx iron were the same Recommended Colace prn to help with constipation Patient self care activities - Over the next 180 days, patient will: Take ferrous sulfate 325mg 50my along with colace May titrate to twice daily if tolerated until next Hgb result COPD Pharmacist Clinical Goal(s) Over the next 180 days, patient will work with PharmD and providers to optimize medication and minimize symptoms related to COPD. Current regimen:  Symbicort 160-4.5mcg I79mrventions: Discussed proper use of maintenance inhalers. Patient self care activities - Over the next 180 days, patient will: Continue to take medication as prescribed. Contact providers with any sudden change in symptoms or increased SOB. Dementia Pharmacist Clinical Goal(s) Over the next 180 days, patient will work with PharmD and providers to optimize medication and minimize symptoms related to dementia. Current regimen:   None Interventions: Discussed potential side effects of Donepezil. Patient self care activities - Over the next 180 days, patient will: Continue to monitor herself and reports any change in symptoms to providers.  Initial goal documentation         COPD   Eosinophil count:   Lab Results  Component Value Date/Time   EOSPCT 8.5 02/21/2020 12:28 PM  %                               Eos (Absolute):  Lab Results  Component Value Date/Time   EOSABS 680 (H) 02/21/2020 12:28 PM    Tobacco Status:  Social History   Tobacco Use  Smoking Status Former Smoker   Packs/day: 1.00   Years: 15.00   Pack years: 15.00   Types: Cigarettes   Quit date: 12/27/1980   Years since quitting: 39.2  Smokeless Tobacco Never Used    Patient has failed these meds in past: Stiolto Patient is currently controlled on the following medications:  Symbicort 160-4.5mcg tw72m daily Using maintenance inhaler regularly? Yes Frequency of rescue inhaler use:  never  No maintenance inhaler on hand.  Patient reports her breathing was controlled lately and reports adherence to Symbicort.  easinophils still elevated.  Counseled on importance of using her maintenance inhaler regularly.  Plan  Continue current medications. , Hypertension   Office blood pressures are  BP Readings from Last 3 Encounters:  04/01/20 (!) 146/87  03/04/20 (!) 157/82  02/21/20 120/70    Patient has failed these meds in the past: none noted  Patient checks BP at home infrequently  Patient home BP readings are ranging: no logs  Patient is currently controlled on the following medications:  Lisinopril 2m HCTZ 257mdaily Verapamil 2408ms  Encouraged patient to monitor her blood pressure at home at least periodically.  Denies dizziness, headaches.  Adherent with medication and reports no adverse effects.  Plan  Continue current medications    GERD    Patient has failed these meds in past: none noted Patient  is currently controlled on the following medications:  Pantoprazole 40 mg bid  Patient has history of severe anemia with hemoglobin of 6.1.  No GI bleed noted on colonoscopy or upper endoscopy.  Pending biopsies.  Reports no blood in stool/urine.  Taking appropriately.  Plan  Continue current medications Dyslipidemia   LDL goal < 100  Lipid Panel     Component Value Date/Time   CHOL 158 03/06/2017 0911   TRIG 111 03/06/2017 0911   HDL 72 03/06/2017 0911   LDLCALC 64 03/06/2017 0911    Hepatic Function Latest Ref Rng & Units 02/07/2020 09/26/2019 08/10/2018  Total Protein 6.1 - 8.1 g/dL 6.7 6.8 6.9  Albumin 3.5 - 5.0 g/dL - - -  AST 10 - 35 U/L '12 14 31  ' ALT 6 - 29 U/L 11 15 81(H)  Alk Phosphatase 38 - 126 U/L - - -  Total Bilirubin 0.2 - 1.2 mg/dL 0.3 0.3 0.3  Bilirubin, Direct 0.1 - 0.5 mg/dL - - -     The ASCVD Risk score (GofStanwoodet al., 2013) failed to calculate for the following reasons:   Cannot find a previous HDL lab   Cannot find a previous total cholesterol lab   Patient has failed these meds in past: none noted Patient is currently controlled on the following medications:  Pravastatin 63m74making daily at bedtime.  Reports complete adherence with no myalgias.  LDL WNL at last check. Recommend repeat lipid panel.  Plan  Continue current medications, recheck lipid panel at next CPE.  CKD   Kidney Function Lab Results  Component Value Date/Time   CREATININE 1.38 (H) 02/21/2020 12:28 PM   CREATININE 2.13 (H) 02/10/2020 05:20 PM   CREATININE 1.61 (H) 02/07/2020 03:57 PM   GFR 77.72 12/28/2010 09:03 AM   GFRNONAA 37 (L) 02/21/2020 12:28 PM   GFRAA 42 (L) 02/21/2020 12:28 PM   K 4.0 02/21/2020 12:28 PM   K 3.8 02/10/2020 05:20 PM   K 4.2 07/22/2013 01:25 PM   Evaluated medication profile for safety with current kidney function.  Plan  Continue current medications Dementia   Patient has failed these meds in past: Donepezil Patient is currently  uncontrolled on the following medications:  None  Patient self d/c her donepezil after about 2 weeks due to feeling depressed.  No longer interested in taking and reports her mood has gotten much better.  Plan  Monitor her for progression of dementia.  Anemia   Hemoglobin & Hematocrit     Component Value Date/Time   HGB  9.0 (L) 02/21/2020 1228   HCT 30.3 (L) 02/21/2020 1228    Patient has failed these meds in past: none noted Patient is currently controlled on the following medications:  Ferrous sulfate 357m bid  Patient reports she had d/c her iron on her own as well because it was causing her severe constipation.  After discussion, she was taking OTC Iron as well as ferrous sulfate.  Counseled her these were the same medication and to take just one of them.  Also discussed stool softeners she could use to help with the constipation.  Discussed importance of iron when her blood counts were this low.   Plan  Resume ferrous sulfate 3234mdaily with Colace.  May increase ferrous sulfate back up to twice daily if tolerated until further eval of Hgb.  Vaccines   Reviewed and discussed patient's vaccination history.    Immunization History  Administered Date(s) Administered   Fluad Quad(high Dose 65+) 06/06/2019   Influenza, High Dose Seasonal PF 05/23/2017, 06/05/2018   Pneumococcal Polysaccharide-23 06/06/2019   She reports she has gotten both COVID-19 vaccines.  Plan  Recommended patient receive shingles vaccine in office.  Medication Management   Miscellaneous medications: Potassium Chloride 10 meq Venlafaxine XR 7546mTC's:  Hair, skin and nails vitamin Fish oil MVI Melatonin 5mg59mtient [facility].  Patient reports using pill box method to organize medications and promote adherence. Patient denies missed doses of medication.   ChriBeverly MilcharmD Clinical Pharmacist [phone]  collaborated with the care management provider regarding care management and care coordination activities outlined in this encounter and have reviewed this encounter including documentation in the note and care plan. I am certifying that I agree with the content of this note and encounter as supervising physician.

## 2020-04-01 NOTE — Anesthesia Preprocedure Evaluation (Signed)
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), Residual Symptoms    GI/Hepatic Neg liver ROS, GERD  Controlled,  Endo/Other  negative endocrine ROS  Renal/GU Renal disease  negative genitourinary   Musculoskeletal   Abdominal   Peds  Hematology  (+) Blood dyscrasia, anemia ,   Anesthesia Other Findings Past Medical History: No date: Dyslipidemia No date: Frequent falls No date: GERD (gastroesophageal reflux disease) No date: HTN (hypertension) No date: Memory loss No date: PFO (patent foramen ovale) No date: Retinal artery occlusion No date: Weakness  Past Surgical History: No date: ANKLE ARTHROSCOPY WITH OPEN REDUCTION INTERNAL FIXATION  (ORIF); Right No date: APPENDECTOMY No date: TUBAL LIGATION     Reproductive/Obstetrics negative OB ROS                             Anesthesia Physical  Anesthesia Plan  ASA: III  Anesthesia Plan: General   Post-op Pain Management:    Induction: Intravenous  PONV Risk Score and Plan: Propofol infusion and TIVA  Airway Management Planned: Natural Airway and Nasal Cannula  Additional Equipment:   Intra-op Plan:   Post-operative Plan:   Informed Consent: I have reviewed the patients History and Physical, chart, labs and discussed the procedure including the risks,  benefits and alternatives for the proposed anesthesia with the patient or authorized representative who has indicated his/her understanding and acceptance.     Dental Advisory Given  Plan Discussed with: Anesthesiologist, CRNA and Surgeon  Anesthesia Plan Comments: (Patient consented for risks of anesthesia including but not limited to:  - adverse reactions to medications - risk of intubation if required - damage to eyes, teeth, lips or other oral mucosa - nerve damage due to positioning  - sore throat or hoarseness - Damage to heart, brain, nerves, lungs, other parts of body or loss of life  Patient voiced understanding.)        Anesthesia Quick Evaluation

## 2020-04-01 NOTE — Op Note (Signed)
[facility] Gastroenterology Patient Name: Virginia Walters Procedure Date: 04/01/2020 9:34 AM MRN: [id number] Account #: [id number] Date of Birth: 1941/06/13 Admit Type: Outpatient Age: 79 Room: [facility] ENDO ROOM 4 Gender: Female Note Status: Finalized Procedure:             Upper GI endoscopy Indications:           Iron deficiency anemia, Personal history of peptic                         ulcer disease Providers:             Naol Ontiveros B. Bonna Gains MD, MD Referring MD:          Eula Flax (Referring MD) Medicines:             Monitored Anesthesia Care Complications:         No immediate complications. Procedure:             Pre-Anesthesia Assessment:                        - Prior to the procedure, a History and Physical was                         performed, and patient medications, allergies and                         sensitivities were reviewed. The patient's tolerance                         of previous anesthesia was reviewed.                        - The risks and benefits of the procedure and the                         sedation options and risks were discussed with the                         patient. All questions were answered and informed                         consent was obtained.                        - Patient identification and proposed procedure were                         verified prior to the procedure by the physician, the                         nurse, the anesthesiologist, the anesthetist and the                         technician. The procedure was verified in the                         procedure room.                        - ASA Grade Assessment: II - A  patient with mild                         systemic disease.                        After obtaining informed consent, the endoscope was                         passed under direct vision. Throughout the procedure,                         the patient's blood pressure, pulse, and oxygen                          saturations were monitored continuously. The Endoscope                         was introduced through the mouth, and advanced to the                         second part of duodenum. The upper GI endoscopy was                         accomplished with ease. The patient tolerated the                         procedure well. Findings:      The examined esophagus was normal.      A single area of ectopic gastric mucosa was found in the upper third of       the esophagus.      Patchy mildly erythematous mucosa without bleeding was found in the       gastric antrum. Biopsies were taken with a cold forceps for histology.       Biopsies were obtained in the gastric body, at the incisura and in the       gastric antrum with cold forceps for histology.      Localized mild mucosal changes characterized by thickened folds were       found in the gastric antrum. Biopsies were taken with a cold forceps for       histology.      A large hiatal hernia was present.      A single 4 mm sessile polyp with no bleeding and no stigmata of recent       bleeding was found in the gastric body. Biopsies were taken with a cold       forceps for histology.      The exam of the stomach was otherwise normal.      The duodenal bulb, second portion of the duodenum and examined duodenum       were normal. Impression:            - Normal esophagus.                        - Ectopic gastric mucosa in the upper third of the                         esophagus.                        -  Erythematous mucosa in the antrum. Biopsied.                        - Thickened folds mucosa in the antrum. Biopsied.                        - Large hiatal hernia.                        - A single gastric polyp. Biopsied.                        - Normal duodenal bulb, second portion of the duodenum                         and examined duodenum.                        - Biopsies were obtained in the gastric body, at the                          incisura and in the gastric antrum. Recommendation:        - Await pathology results.                        - Discharge patient to home (with escort).                        - Advance diet as tolerated.                        - Continue present medications.                        - Patient has a contact number available for                         emergencies. The signs and symptoms of potential                         delayed complications were discussed with the patient.                         Return to normal activities tomorrow. Written                         discharge instructions were provided to the patient.                        - Discharge patient to home (with escort).                        - The findings and recommendations were discussed with                         the patient.                        - The findings and recommendations were discussed with  the patient's family. Procedure Code(s):     --- Professional ---                        [phone], Esophagogastroduodenoscopy, flexible,                         transoral; with biopsy, single or multiple Diagnosis Code(s):     --- Professional ---                        K31.89, Other diseases of stomach and duodenum                        K44.9, Diaphragmatic hernia without obstruction or                         gangrene                        D50.9, Iron deficiency anemia, unspecified                        Z87.11, Personal history of peptic ulcer disease CPT copyright 2019 [employer]. All rights reserved. The codes documented in this report are preliminary and upon coder review may  be revised to meet current compliance requirements.  [name], MD Margretta Sidle B. Bonna Gains MD, MD 04/01/2020 9:55:10 AM This report has been signed electronically. Number of Addenda: 0 Note Initiated On: 04/01/2020 9:34 AM Estimated Blood Loss:  Estimated blood loss:  none.      [facility]

## 2020-04-02 ENCOUNTER — Ambulatory Visit: Payer: Medicare Other | Admitting: Pharmacist

## 2020-04-02 ENCOUNTER — Encounter: Payer: Self-pay | Admitting: Gastroenterology

## 2020-04-02 ENCOUNTER — Other Ambulatory Visit: Payer: Self-pay | Admitting: *Deleted

## 2020-04-02 DIAGNOSIS — F02818 Dementia in other diseases classified elsewhere, unspecified severity, with other behavioral disturbance: Secondary | ICD-10-CM

## 2020-04-02 DIAGNOSIS — J449 Chronic obstructive pulmonary disease, unspecified: Secondary | ICD-10-CM

## 2020-04-02 DIAGNOSIS — F0281 Dementia in other diseases classified elsewhere with behavioral disturbance: Secondary | ICD-10-CM

## 2020-04-02 DIAGNOSIS — E785 Hyperlipidemia, unspecified: Secondary | ICD-10-CM

## 2020-04-02 DIAGNOSIS — R5382 Chronic fatigue, unspecified: Secondary | ICD-10-CM

## 2020-04-02 DIAGNOSIS — I10 Essential (primary) hypertension: Secondary | ICD-10-CM

## 2020-04-02 DIAGNOSIS — D509 Iron deficiency anemia, unspecified: Secondary | ICD-10-CM

## 2020-04-02 DIAGNOSIS — J849 Interstitial pulmonary disease, unspecified: Secondary | ICD-10-CM

## 2020-04-02 DIAGNOSIS — F339 Major depressive disorder, recurrent, unspecified: Secondary | ICD-10-CM

## 2020-04-02 DIAGNOSIS — D500 Iron deficiency anemia secondary to blood loss (chronic): Secondary | ICD-10-CM

## 2020-04-02 DIAGNOSIS — I5022 Chronic systolic (congestive) heart failure: Secondary | ICD-10-CM

## 2020-04-02 LAB — SURGICAL PATHOLOGY

## 2020-04-02 NOTE — Patient Instructions (Addendum)
Visit Information Thank you for meeting with me today!  I look forward to working with you to help you meet all of your healthcare goals and answer any questions you may have.  Feel free to contact me anytime!  Goals Addressed            This Visit's Progress   . Pharmacy Care Plan:       CARE PLAN ENTRY (see longitudinal plan of care for additional care plan information)  Current Barriers:  . Chronic Disease Management support, education, and care coordination needs related to Hypertension, Hyperlipidemia, COPD, and anemia , dementia.   Hypertension BP Readings from Last 3 Encounters:  04/01/20 (!) 146/87  03/04/20 (!) 157/82  02/21/20 120/70   . Pharmacist Clinical Goal(s): o Over the next 180 days, patient will work with PharmD and providers to achieve BP goal <140/90 . Current regimen:   Lisinopril 40mg   HCTZ 25mg  daily  Verapamil 240mg  hs . Interventions: o Reviewed home monitoring  o Recommended patient monitor BP at home . Patient self care activities - Over the next 180 days, patient will: o Check BP periodically, document, and provide at future appointments o Ensure daily salt intake < 2300 mg/day o Contact providers with BP consistently > 140/90  Hyperlipidemia Lab Results  Component Value Date/Time   LDLCALC 64 03/06/2017 09:11 AM   . Pharmacist Clinical Goal(s): o Over the next 180 days, patient will work with PharmD and providers to maintain LDL goal < 100 . Current regimen:  o Pravastatin 40mg  . Interventions: o Reviewed most recent lipid panel. o Counseled on importance of medication adherence. . Patient self care activities - Over the next 180 days, patient will: o Continue to focus on medication adherence by pill box  Anemia . Pharmacist Clinical Goal(s) o Over the next 180 days, patient will work with PharmD and providers to optimize medication related to anemia. . Current regimen:  o Ferrous sulfate 325mg  twice  daily . Interventions: o Discussed OTC iron and Rx iron were the same o Recommended Colace prn to help with constipation . Patient self care activities - Over the next 180 days, patient will: o Take ferrous sulfate 325mg  daily along with colace o May titrate to twice daily if tolerated until next Hgb result COPD . Pharmacist Clinical Goal(s) o Over the next 180 days, patient will work with PharmD and providers to optimize medication and minimize symptoms related to COPD. . Current regimen:  o Symbicort 160-4.17mcg . Interventions: o Discussed proper use of maintenance inhalers. . Patient self care activities - Over the next 180 days, patient will: o Continue to take medication as prescribed. o Contact providers with any sudden change in symptoms or increased SOB. Dementia . Pharmacist Clinical Goal(s) o Over the next 180 days, patient will work with PharmD and providers to optimize medication and minimize symptoms related to dementia. . Current regimen:  o None . Interventions: o Discussed potential side effects of Donepezil. . Patient self care activities - Over the next 180 days, patient will: o Continue to monitor herself and reports any change in symptoms to providers.  Initial goal documentation        Ms. Borger was given information about Chronic Care Management services today including:  1. CCM service includes personalized support from designated clinical staff supervised by her physician, including individualized plan of care and coordination with other care providers 2. 24/7 contact phone numbers for assistance for urgent and routine care needs. 3. Standard insurance,  coinsurance, copays and deductibles apply for chronic care management only during months in which we provide at least 20 minutes of these services. Most insurances cover these services at 100%, however patients may be responsible for any copay, coinsurance and/or deductible if applicable. This service may  help you avoid the need for more expensive face-to-face services. 4. Only one practitioner may furnish and bill the service in a calendar month. 5. The patient may stop CCM services at any time (effective at the end of the month) by phone call to the office staff.  Patient agreed to services and verbal consent obtained.   The patient verbalized understanding of instructions provided today and agreed to receive a mailed copy of patient instruction and/or educational materials. Telephone follow up appointment with pharmacy team member scheduled for: 64 months  Shandell Giovanni, PharmD Clinical Pharmacist [place] [phone]   Eating Plan for Chronic Obstructive Pulmonary Disease Chronic obstructive pulmonary disease (COPD) causes symptoms such as shortness of breath, coughing, and chest discomfort. These symptoms can make it difficult to eat enough to maintain a healthy weight. Generally, people with COPD should eat a diet that is high in calories, protein, and other nutrients to maintain body weight and to keep the lungs as healthy as possible. Depending on the medicines you take and other health conditions you may have, your health care provider may give you additional recommendations on what to eat or avoid. Talk with your health care provider about your goals for body weight, and work with a dietitian to develop an eating plan that is right for you. What are tips for following this plan? Reading food labels   Avoid foods with more than 300 milligrams (mg) of salt (sodium) per serving.  Choose foods that contain at least 4 grams (g) of fiber per serving. Try to eat 20-30 g of fiber each day.  Choose foods that are high in calories and protein, such as nuts, beans, yogurt, and cheese. Shopping  Do not buy foods labeled as diet, low-calorie, or low-fat.  If you are able to eat dairy products: ? Avoid low-fat or skim milk. ? Buy dairy products that have at least  2% fat.  Buy nutritional supplement drinks.  Buy grains and prepared foods labeled as enriched or fortified.  Consider buying low-sodium, pre-made foods to conserve energy for eating. Cooking  Add dry milk or protein powder to smoothies.  Cook with healthy fats, such as olive oil, canola oil, sunflower oil, and grapeseed oil.  Add oil, butter, cream cheese, or nut butters to foods to increase fat and calories.  To make foods easier to chew and swallow: ? Cook vegetables, pasta, and rice until soft. ? Cut or grind meat into very small pieces. ? Dip breads in liquid. Meal planning   Eat when you feel hungry.  Eat 5-6 small meals throughout the day.  Drink 6-8 glasses of water each day.  Do not drink liquids with meals. Drink liquids at the end of the meal to avoid feeling full too quickly.  Eat a variety of fruits and vegetables every day.  Ask for assistance from family or friends with planning and preparing meals as needed.  Avoid foods that cause you to feel bloated, such as carbonated drinks, fried foods, beans, broccoli, cabbage, and apples.  For older adults, ask your local agency on aging whether you are eligible for meal assistance programs, such as Meals on Wheels. Lifestyle   Do not smoke.  Eat  slowly. Take small bites and chew food well before swallowing.  Do not overeat. This may make it more difficult to breathe after eating.  Sit up while eating.  If needed, continue to use supplemental oxygen while eating.  Rest or relax for 30 minutes before and after eating.  Monitor your weight as told by your health care provider.  Exercise as told by your health care provider. What foods can I eat? Fruits All fresh, dried, canned, or frozen fruits that do not cause gas. Vegetables All fresh, canned (no salt added), or frozen vegetables that do not cause gas. Grains Whole grain bread. Enriched whole grain pasta. Fortified whole grain cereals. Fortified  rice. Quinoa. Meats and other proteins Lean meat. Poultry. Fish. Dried beans. Unsalted nuts. Tofu. Eggs. Nut butters. Dairy Whole or 2% milk. Cheese. Yogurt. Fats and oils Olive oil. Canola oil. Butter. Margarine. Beverages Water. Vegetable juice (no salt added). Decaffeinated coffee. Decaffeinated or herbal tea. Seasonings and condiments Fresh or dried herbs. Low-salt or salt-free seasonings. Low-sodium soy sauce. The items listed above may not be a complete list of foods and beverages you can eat. Contact a dietitian for more information. What foods are not recommended? Fruits Fruits that cause gas, such as apples or melon. Vegetables Vegetables that cause gas, such as broccoli, Brussels sprouts, cabbage, cauliflower, and onions. Canned vegetables with added salt. Meats and other proteins Fried meat. Salt-cured meat. Processed meat. Dairy Fat-free or low-fat milk, yogurt, or cheese. Processed cheese. Beverages Carbonated drinks. Caffeinated drinks, such as coffee, tea, and soft drinks. Juice. Alcohol. Vegetable juice with added salt. Seasonings and condiments Salt. Seasoning mixes with salt. Soy sauce. Virginia Walters. Other foods Clear soup or broth. Fried foods. Prepared frozen meals. The items listed above may not be a complete list of foods and beverages you should avoid. Contact a dietitian for more information. Summary  COPD symptoms can make it difficult to eat enough to maintain a healthy weight.  A COPD eating plan can help you maintain your body weight and keep your lungs as healthy as possible.  Eat a diet that is high in calories, protein, and other nutrients. Read labels to make sure that you are getting the right nutrients. Cook foods to make them easy to chew and swallow.  Eat 5-6 small meals throughout the day, and avoid foods that cause gas or make you feel bloated. This information is not intended to replace advice given to you by your health care provider. Make sure  you discuss any questions you have with your health care provider. Document Revised: 11/22/2018 Document Reviewed: 10/17/2017 Elsevier Patient Education  [place].

## 2020-04-06 ENCOUNTER — Encounter: Payer: Self-pay | Admitting: Gastroenterology

## 2020-04-13 ENCOUNTER — Ambulatory Visit: Payer: Self-pay | Admitting: Pharmacist

## 2020-04-13 NOTE — Chronic Care Management (AMB) (Addendum)
Chronic Care Management   Follow Up Note   04/13/2020 Name: Virginia Walters MRN: [id number] DOB: March 09, 1941  Referred by: Susy Frizzle, MD Reason for referral : No chief complaint on file.   Virginia Walters is a 79 y.o. year old female who is a primary care patient of Pickard, Cammie Mcgee, MD. The CCM team was consulted for assistance with chronic disease management and care coordination needs.    Review of patient status, including review of consultants reports, relevant laboratory and other test results, and collaboration with appropriate care team members and the patient's provider was performed as part of comprehensive patient evaluation and provision of chronic care management services.    SDOH (Social Determinants of Health) assessments performed: No See Care Plan activities for detailed interventions related to [facility])      Outpatient Encounter Medications as of 04/13/2020  Medication Sig   B Complex-Biotin-FA (B-COMPLEX PO) Take 1 capsule by mouth daily.    Calcium Carbonate-Vitamin D (CALCIUM + D PO) Take 1 capsule by mouth 2 (two) times daily.    Cholecalciferol (VITAMIN D) 2000 UNITS CAPS Take 1 capsule by mouth daily.    donepezil (ARICEPT) 10 MG tablet Take 1 tablet (10 mg total) by mouth at bedtime. (Patient not taking: Reported on 04/02/2020)   dorzolamide (TRUSOPT) 2 % ophthalmic solution Place 1 drop into the left eye 2 (two) times daily.    ferrous sulfate 325 (65 FE) MG EC tablet Take 1 tablet (325 mg total) by mouth 2 (two) times daily.   fish oil-omega-3 fatty acids 1000 MG capsule Take 2 g by mouth 2 (two) times daily.     hydrochlorothiazide (HYDRODIURIL) 25 MG tablet Take 1 tablet (25 mg total) by mouth daily.   latanoprost (XALATAN) 0.005 % ophthalmic solution Place 1 drop into the left eye at bedtime.    lisinopril (ZESTRIL) 40 MG tablet Take 40 mg by mouth daily.   melatonin 5 MG TABS Take 5 mg by mouth at bedtime.   Multiple Vitamin (MULTIVITAMIN) tablet  Take 1 tablet by mouth daily.     Na Sulfate-K Sulfate-Mg Sulf 17.5-3.13-1.6 GM/177ML SOLN At 5 PM the day before procedure take 1 bottle and 5 hours before procedure take 1 bottle.   pantoprazole (PROTONIX) 40 MG tablet Take 1 tablet (40 mg total) by mouth 2 (two) times daily.   potassium chloride (KLOR-CON) 10 MEQ tablet TAKE 1 TABLET BY MOUTH  DAILY (Patient taking differently: Take 10 mEq by mouth daily. )   pravastatin (PRAVACHOL) 40 MG tablet Take 1 tablet (40 mg total) by mouth daily.   SYMBICORT 160-4.5 MCG/ACT inhaler USE 2 INHALATIONS BY MOUTH  TWICE DAILY   traZODone (DESYREL) 50 MG tablet Take 1 tablet (50 mg total) by mouth at bedtime.   Turmeric 500 MG CAPS Take 500 mg by mouth daily.    venlafaxine XR (EFFEXOR-XR) 75 MG 24 hr capsule TAKE 1 CAPSULE BY MOUTH ONCE DAILY WITH BREAKFAST   verapamil (CALAN-SR) 240 MG CR tablet TAKE 1 TABLET BY MOUTH AT  BEDTIME (Patient taking differently: Take 240 mg by mouth at bedtime. )   vitamin E 180 MG (400 UNITS) capsule Take 180 mg by mouth daily.    No facility-administered encounter medications on file as of 04/13/2020.     Objective:  Utilized [facility] [facility] to analyze care gaps and assess patient adherence for STAR measures.  Goals Addressed   None     Care Gaps - AWV not performed Adherence  measures -  Pravastatin - PDC = 100%, 0 days missed Lisinopril 40mg  - not yet in measure as patient has only filled once this year   Plan:   The patient has been provided with contact information for the care management team and has been advised to call with any health related questions or concerns.    Beverly Milch, PharmD Clinical Pharmacist [place] [phone]  I have collaborated with the care management provider regarding care management and care coordination activities outlined in this encounter and have reviewed this encounter including documentation in the note and care plan. I am certifying that I  agree with the content of this note and encounter as supervising physician.

## 2020-04-28 DIAGNOSIS — H25013 Cortical age-related cataract, bilateral: Secondary | ICD-10-CM | POA: Diagnosis not present

## 2020-04-28 DIAGNOSIS — H524 Presbyopia: Secondary | ICD-10-CM | POA: Diagnosis not present

## 2020-04-28 DIAGNOSIS — H2513 Age-related nuclear cataract, bilateral: Secondary | ICD-10-CM | POA: Diagnosis not present

## 2020-04-28 DIAGNOSIS — H401122 Primary open-angle glaucoma, left eye, moderate stage: Secondary | ICD-10-CM | POA: Diagnosis not present

## 2020-04-28 DIAGNOSIS — H401113 Primary open-angle glaucoma, right eye, severe stage: Secondary | ICD-10-CM | POA: Diagnosis not present

## 2020-04-29 DIAGNOSIS — J449 Chronic obstructive pulmonary disease, unspecified: Secondary | ICD-10-CM | POA: Diagnosis not present

## 2020-04-29 DIAGNOSIS — I509 Heart failure, unspecified: Secondary | ICD-10-CM | POA: Diagnosis not present

## 2020-05-19 ENCOUNTER — Ambulatory Visit: Payer: Medicare Other | Admitting: Gastroenterology

## 2020-05-29 DIAGNOSIS — J449 Chronic obstructive pulmonary disease, unspecified: Secondary | ICD-10-CM | POA: Diagnosis not present

## 2020-05-29 DIAGNOSIS — I509 Heart failure, unspecified: Secondary | ICD-10-CM | POA: Diagnosis not present

## 2020-06-11 ENCOUNTER — Other Ambulatory Visit: Payer: Self-pay | Admitting: Family Medicine

## 2020-06-29 ENCOUNTER — Telehealth: Payer: Self-pay

## 2020-06-29 DIAGNOSIS — J449 Chronic obstructive pulmonary disease, unspecified: Secondary | ICD-10-CM | POA: Diagnosis not present

## 2020-06-29 DIAGNOSIS — I509 Heart failure, unspecified: Secondary | ICD-10-CM | POA: Diagnosis not present

## 2020-06-29 NOTE — Telephone Encounter (Signed)
Pt needed medication faxed to her dentist office.

## 2020-07-02 ENCOUNTER — Other Ambulatory Visit: Payer: Self-pay

## 2020-07-02 ENCOUNTER — Ambulatory Visit (INDEPENDENT_AMBULATORY_CARE_PROVIDER_SITE_OTHER): Payer: Medicare Other | Admitting: Family Medicine

## 2020-07-02 VITALS — BP 150/80 | HR 79 | Temp 97.8°F | Ht 65.0 in | Wt 237.0 lb

## 2020-07-02 DIAGNOSIS — Z23 Encounter for immunization: Secondary | ICD-10-CM

## 2020-07-02 DIAGNOSIS — G301 Alzheimer's disease with late onset: Secondary | ICD-10-CM | POA: Diagnosis not present

## 2020-07-02 DIAGNOSIS — F0281 Dementia in other diseases classified elsewhere with behavioral disturbance: Secondary | ICD-10-CM

## 2020-07-02 DIAGNOSIS — D500 Iron deficiency anemia secondary to blood loss (chronic): Secondary | ICD-10-CM | POA: Diagnosis not present

## 2020-07-02 DIAGNOSIS — R6889 Other general symptoms and signs: Secondary | ICD-10-CM | POA: Diagnosis not present

## 2020-07-02 DIAGNOSIS — F02818 Dementia in other diseases classified elsewhere, unspecified severity, with other behavioral disturbance: Secondary | ICD-10-CM

## 2020-07-02 DIAGNOSIS — D539 Nutritional anemia, unspecified: Secondary | ICD-10-CM | POA: Diagnosis not present

## 2020-07-02 NOTE — Progress Notes (Signed)
Subjective:    Patient ID: Virginia Walters, female    DOB: 02-08-1941, 79 y.o.   MRN: [id number]  HPI  02/07/20 Patient is a very pleasant 79 year old Caucasian female who presents today with her family member.  She has a history of mild dementia.  She presents with fatigue.  Recently her family member has had to increase her oxygen level to help her breathe.  However her oxygen saturation has consistently been 98% irregardless of her oxygen supplementation.  Today she is 98% on room air having not had any oxygen in more than 30 minutes.  Therefore I do not believe the oxygen is the reason that she is short of breath.  Rather than shortness of breath, the patient is actually endorsing fatigue and easy fatigability.  She is mildly tachycardic on exam today.  She also appears somewhat dehydrated.  Her conjunctiva are extremely pale as are her lips and skin.  She appears to be anemic.  She denies any chest pain.  She denies any cough.  She denies any abdominal pain.  She denies any melena or hematochezia.  She denies any epistaxis or bruising.  She denies any dysuria.  She denies any vomiting or diarrhea.  At that time, my plan was: After our history and physical, I believe the patient is suffering more from severe fatigue and easy fatigability rather than shortness of breath.  She is dehydrated on exam.  She is mildly tachycardic.  She is also very pale.  I am concerned that the patient may be anemic.  I will check a CBC, CMP, B12, TSH, and iron level to evaluate further.  If she is severely anemic, she will likely need GI consultation to rule out GI sources of bleeding as well as I suspect iron deficiency anemia.  Await the results of the lab work to determine the next step in the work-up.  02/21/20 Labs showed a hemoglobin of 6.1!.  Therefore she was referred to the emergency room for evaluation suspecting a GI bleed.  Admit date: 02/10/2020 Discharge date: 02/11/2020  Admitted From:  Home Disposition:  Home  Recommendations for Outpatient Follow-up:  1. Follow up with PCP in 1-2 weeks 2. Follow-up with gastroenterology in 2 to 3 weeks 3. Please obtain BMP/CBC in one week 4. Please follow up on the following pending results: None  Home Health: No Equipment/Devices: Home oxygen Discharge Condition: Stable CODE STATUS: Full Diet recommendation: Heart Healthy   Brief/Interim Summary: Virginia I Murrellis a 79 y.o.femalewith medical history significant forsystolic heart failure , last EF 45%on as needed oxygen at home at 2 L%, HTN, glaucoma, who was sent to the ER by her PCP for hemoglobin of 6.1 after patient presented 3 days prior with a complaint of fatigue . Work-up in the outpatient revealed hemoglobin 6.1, iron 19, B12 over 2000 and TSH 1.4. Creatinine at that time was 1.61. Patient denies black or bloody stool or any change in bowel habits. Denies abdominal pain. Denies nausea or vomiting. Denies any bleeding or bruising. Denies chest painor shortness of breath. Has had no recent acute illness, denies cough, fever or chills. Said she has been using Aleve twice a day for the past year but denies abdominal pain.  Also denied rectal exam for stool guaiac. She was given 2 unit of PRBCs, hemoglobin on discharge day was 8.3. He was taken for EGD by GI in the morning.  It shows multiple nonbleeding gastric ulcerations.  Biopsies were taken for H. pylori and pathology.  She was advised to avoid all the NSAIDs and take Protonix 40 mg twice daily for next 8 weeks.  She will follow-up with gastroenterology as an outpatient. She had low iron at 12.  Given iron supplement and will need to follow-up with primary care physician for further management.  Patient has an history of chronic systolic heart failure, EF of 45 to 50% on echo done in December 2019.  No concern for acute decompensation.  She will follow-up with her cardiologist and continue with her home  meds.  Her home antihypertensives were held initially due to some softer blood pressure which improved after transfusion.  She will resume her home medications and follow-up with her primary care physician.  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 - Non-bleeding gastric ulcers with a clean ulcer base ([place] Class III). - Medium-sized hiatal hernia. 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 Treated with argon plasma coagulation (APC). - No specimens collected. - Return patient to hospital ward for ongoing care. - Continue present medications. - The findings and recommendations were discussed with the patient. - Perform an H. pylori serology today. - Use Protonix (pantoprazole) 40 mg PO BID for 8 weeks. - Pt is refusing a colonoscopy at this time. If hemoglobin stays stable pt to follow up in GI clinic as an outpatient to set up elective colonoscopy. If hemoglobin drops or active bleeding occurs pt would need an inpatient colonoscopy if she is agreeable. At this time she would like to defer colonoscopy - Return to my office in 2 weeks.  02/21/20 Patient is here today in my office unaccompanied.  Patient drove herself.  She is very confused as to what happened in the hospital.  She was not certain what happened or what they did.  She is also uncertain of the medication that she is taking.  She does not know if she is taking her iron pill.  She does recognize the name Protonix but she is not certain if she is taking it twice a day.  She denies taking any NSAIDs.  She does occasionally drink a glass of wine.  I encouraged her to avoid all alcohol and NSAIDs due to the ulcer seen  in her stomach.  She denies any melena or bright red blood per rectum.  She denies any abdominal pain.  She has an appointment scheduled to see the gastroenterologist on the 21.  Colonoscopy has not been performed yet.  At that time, my plan was: Patient is doing much better since her hospitalization after the transfusion.  However I am concerned that her dementia is worsening.  She seems more confused and has a difficult time answering questions.  I have previously told the patient I did not feel that she should be driving.  I also feel that she needs to have her medication supervised particularly as she is unable to answer my questions today about what she is taking and how she is taking it.  Therefore I encouraged her to allow her family to prepare her pillbox for her.  I also recommended that we start Aricept 10 mg a day to slow the progression of her dementia.  I will check a CBC  today to ensure that her hemoglobin is not trending further.  If stable or improved, we can wait until the 21st to schedule her colonoscopy.  If worsening, she may need an inpatient colonoscopy.  Continue taking iron and Protonix twice daily.  Avoid all NSAIDs, aspirin, alcohol.  07/02/20 Since I last saw the patient, she had her colonoscopy.  No malignancy was seen.  No source of bleeding was seen.  She is still taking iron.  She is also on Protonix twice a day.  She denies any melena or hematochezia although she does report constipation on the Protonix.  She stopped taking the Aricept.  It caused vivid dreams and mood changes.  Therefore she is on nothing to help preserve her memory.  She is overdue for lab work.  Today she seems more back to her baseline.  She is still living at home independently.  She is getting her groceries.  Her daughter is her power of attorney and is managing her bills.  The only pill that she is responsible for is her credit card.  She drives to the grocery store into this office however she denies  traveling on highways or in the city.  She denies any falls except for 1.  Recently she fell trying to get out of bed to go to the bathroom.  She was able to get herself.  She does have a fall pendant however she did not press the button because she did not want to bother anyone since it was 2 in the morning.  She was able to get herself out of the floor.  She uses a walker around her home just to help stabilize herself mainly due to pain.  She denies any depression.  She does have insight enough to know how to use the fall pendant.  She made the decision not to use dependent when she fell because she did not want to bother anyone.  She is due for a flu shot Past Medical History:  Diagnosis Date  . CHF (congestive heart failure) ([place])   . Dyslipidemia   . Frequent falls   . GERD (gastroesophageal reflux disease)   . HTN (hypertension)   . Memory loss   . PFO (patent foramen ovale)   . Retinal artery occlusion   . Weakness    Past Surgical History:  Procedure Laterality Date  . ANKLE ARTHROSCOPY WITH OPEN REDUCTION INTERNAL FIXATION (ORIF) Right   . APPENDECTOMY    . COLONOSCOPY N/A 07/21/2018   Procedure: COLONOSCOPY;  Surgeon: Lin Landsman, MD;  Location: [facility] ENDOSCOPY;  Service: Gastroenterology;  Laterality: N/A;  . COLONOSCOPY WITH PROPOFOL N/A 04/01/2020   Procedure: COLONOSCOPY WITH PROPOFOL;  Surgeon: Virgel Manifold, MD;  Location: [facility] ENDOSCOPY;  Service: Endoscopy;  Laterality: N/A;  . ESOPHAGOGASTRODUODENOSCOPY N/A 07/21/2018   Procedure: ESOPHAGOGASTRODUODENOSCOPY (EGD);  Surgeon: Lin Landsman, MD;  Location: [facility] ENDOSCOPY;  Service: Gastroenterology;  Laterality: N/A;  . ESOPHAGOGASTRODUODENOSCOPY N/A 02/11/2020   Procedure: ESOPHAGOGASTRODUODENOSCOPY (EGD);  Surgeon: Virgel Manifold, MD;  Location: [facility] ENDOSCOPY;  Service: Endoscopy;  Laterality: N/A;  . ESOPHAGOGASTRODUODENOSCOPY (EGD) WITH PROPOFOL N/A 04/01/2020   Procedure: ESOPHAGOGASTRODUODENOSCOPY  (EGD) WITH PROPOFOL;  Surgeon: Virgel Manifold, MD;  Location: [facility] ENDOSCOPY;  Service: Endoscopy;  Laterality: N/A;  . TUBAL LIGATION     Current Outpatient Medications on File Prior to Visit  Medication Sig Dispense Refill  . B Complex-Biotin-FA (B-COMPLEX PO) Take 1 capsule by mouth daily.     . Calcium Carbonate-Vitamin D (  CALCIUM + D PO) Take 1 capsule by mouth 2 (two) times daily.     . Cholecalciferol (VITAMIN D) 2000 UNITS CAPS Take 1 capsule by mouth daily.     . dorzolamide (TRUSOPT) 2 % ophthalmic solution Place 1 drop into the left eye 2 (two) times daily.     . ferrous sulfate 325 (65 FE) MG EC tablet Take 1 tablet (325 mg total) by mouth 2 (two) times daily. 60 tablet 11  . fish oil-omega-3 fatty acids 1000 MG capsule Take 2 g by mouth 2 (two) times daily.      . hydrochlorothiazide (HYDRODIURIL) 25 MG tablet Take 1 tablet (25 mg total) by mouth daily. 90 tablet 3  . latanoprost (XALATAN) 0.005 % ophthalmic solution Place 1 drop into the left eye at bedtime.     Marland Kitchen lisinopril (ZESTRIL) 40 MG tablet Take 40 mg by mouth daily.    . melatonin 5 MG TABS Take 5 mg by mouth at bedtime.    . Multiple Vitamin (MULTIVITAMIN) tablet Take 1 tablet by mouth daily.      . Na Sulfate-K Sulfate-Mg Sulf 17.5-3.13-1.6 GM/177ML SOLN At 5 PM the day before procedure take 1 bottle and 5 hours before procedure take 1 bottle. 354 mL 0  . pantoprazole (PROTONIX) 40 MG tablet Take 1 tablet (40 mg total) by mouth 2 (two) times daily. 60 tablet 2  . potassium chloride (KLOR-CON) 10 MEQ tablet TAKE 1 TABLET BY MOUTH  DAILY (Patient taking differently: Take 10 mEq by mouth daily. ) 90 tablet 3  . pravastatin (PRAVACHOL) 40 MG tablet Take 1 tablet (40 mg total) by mouth daily. 90 tablet 3  . SYMBICORT 160-4.5 MCG/ACT inhaler USE 2 INHALATIONS BY MOUTH  TWICE DAILY 30.6 g 3  . traZODone (DESYREL) 50 MG tablet Take 1 tablet (50 mg total) by mouth at bedtime. 90 tablet 3  . Turmeric 500 MG CAPS Take 500 mg  by mouth daily.     Marland Kitchen venlafaxine XR (EFFEXOR-XR) 75 MG 24 hr capsule TAKE 1 CAPSULE BY MOUTH  DAILY WITH BREAKFAST 90 capsule 3  . verapamil (CALAN-SR) 240 MG CR tablet TAKE 1 TABLET BY MOUTH AT  BEDTIME (Patient taking differently: Take 240 mg by mouth at bedtime. ) 90 tablet 3  . vitamin E 180 MG (400 UNITS) capsule Take 180 mg by mouth daily.     Marland Kitchen donepezil (ARICEPT) 10 MG tablet Take 1 tablet (10 mg total) by mouth at bedtime. (Patient not taking: Reported on 04/02/2020) 30 tablet 11   No current facility-administered medications on file prior to visit.   Allergies  Allergen Reactions  . Oxycodone Itching   Social History   Socioeconomic History  . Marital status: Widowed    Spouse name: Not on file  . Number of children: 1  . Years of education: HS  . Highest education level: Not on file  Occupational History  . Occupation: Retired  Tobacco Use  . Smoking status: Former Smoker    Packs/day: 1.00    Years: 15.00    Pack years: 15.00    Types: Cigarettes    Quit date: 12/27/1980    Years since quitting: 39.5  . Smokeless tobacco: Never Used  Vaping Use  . Vaping Use: Never used  Substance and Sexual Activity  . Alcohol use: Yes    Comment: occassionally  . Drug use: No  . Sexual activity: Not Currently  Other Topics Concern  . Not on file  Social History  Narrative   Lives at home alone.   Right-handed.   No daily use of caffeine.   Social Determinants of Health   Financial Resource Strain: Low Risk   . Difficulty of Paying Living Expenses: Not very hard  Food Insecurity:   . Worried About Charity fundraiser in the Last Year: Not on file  . Ran Out of Food in the Last Year: Not on file  Transportation Needs:   . Lack of Transportation (Medical): Not on file  . Lack of Transportation (Non-Medical): Not on file  Physical Activity:   . Days of Exercise per Week: Not on file  . Minutes of Exercise per Session: Not on file  Stress:   . Feeling of Stress : Not  on file  Social Connections:   . Frequency of Communication with Friends and Family: Not on file  . Frequency of Social Gatherings with Friends and Family: Not on file  . Attends Religious Services: Not on file  . Active Member of Clubs or Organizations: Not on file  . Attends Archivist Meetings: Not on file  . Marital Status: Not on file  Intimate Partner Violence:   . Fear of Current or Ex-Partner: Not on file  . Emotionally Abused: Not on file  . Physically Abused: Not on file  . Sexually Abused: Not on file     Review of Systems  All other systems reviewed and are negative.      Objective:   Physical Exam Constitutional:      General: She is not in acute distress.    Appearance: She is not ill-appearing, toxic-appearing or diaphoretic.  HENT:     Mouth/Throat:     Mouth: Mucous membranes are moist.  Neck:     Vascular: No carotid bruit.  Cardiovascular:     Rate and Rhythm: Normal rate and regular rhythm.     Heart sounds: Normal heart sounds. No murmur heard.   Pulmonary:     Effort: Pulmonary effort is normal. No respiratory distress.     Breath sounds: No stridor. No wheezing, rhonchi or rales.  Chest:     Chest wall: No tenderness.  Musculoskeletal:     Right lower leg: No edema.     Left lower leg: No edema.  Neurological:     General: No focal deficit present.     Mental Status: She is alert. Mental status is at baseline.     Cranial Nerves: Cranial nerves are intact.     Sensory: Sensation is intact.     Coordination: Coordination is intact.     Gait: Gait is intact.           Assessment & Plan:  Iron deficiency anemia due to chronic blood loss - Plan: CBC with Differential/Platelet, Anemia panel, COMPLETE METABOLIC PANEL WITH GFR  Late onset Alzheimer's disease with behavioral disturbance (HCC)  We discussed switching to Namenda but at the present time she is hesitant to start any new medication because of how bad she felt on  Aricept.  Therefore she elects not to take anything at the present time.  I will check a CBC, iron level, B12 level, ferritin.  If iron levels are back to normal and hemoglobin is back to normal she can stop iron supplementation.  At that point we would likely back down on the Protonix to just once a day but continue that indefinitely.  She does report some symptoms of overactive bladder.  She denies other symptoms that  would make me concerned about NPH.  She refuses any medication for overactive bladder when I discussed potential side effects potentially fall risk and confusion.  She received her flu shot today

## 2020-07-02 NOTE — Addendum Note (Signed)
Addended by: Saundra Shelling on: 07/02/2020 11:57 AM   Modules accepted: Orders

## 2020-07-03 LAB — COMPLETE METABOLIC PANEL WITH GFR
AG Ratio: 1.8 (calc) (ref 1.0–2.5)
ALT: 24 U/L (ref 6–29)
AST: 18 U/L (ref 10–35)
Albumin: 4.6 g/dL (ref 3.6–5.1)
Alkaline phosphatase (APISO): 65 U/L (ref 37–153)
BUN/Creatinine Ratio: 13 (calc) (ref 6–22)
BUN: 15 mg/dL (ref 7–25)
CO2: 26 mmol/L (ref 20–32)
Calcium: 10.5 mg/dL — ABNORMAL HIGH (ref 8.6–10.4)
Chloride: 106 mmol/L (ref 98–110)
Creat: 1.2 mg/dL — ABNORMAL HIGH (ref 0.60–0.93)
GFR, Est African American: 50 mL/min/{1.73_m2} — ABNORMAL LOW (ref >=60)
GFR, Est Non African American: 43 mL/min/{1.73_m2} — ABNORMAL LOW (ref >=60)
Globulin: 2.5 g/dL (calc) (ref 1.9–3.7)
Glucose, Bld: 92 mg/dL (ref 65–99)
Potassium: 4.3 mmol/L (ref 3.5–5.3)
Sodium: 142 mmol/L (ref 135–146)
Total Bilirubin: 0.8 mg/dL (ref 0.2–1.2)
Total Protein: 7.1 g/dL (ref 6.1–8.1)

## 2020-07-03 LAB — CBC WITH DIFFERENTIAL/PLATELET
Absolute Monocytes: 866 cells/uL (ref 200–950)
Basophils Absolute: 53 cells/uL (ref 0–200)
Basophils Relative: 0.7 %
Eosinophils Absolute: 53 cells/uL (ref 15–500)
Eosinophils Relative: 0.7 %
HCT: 43.8 % (ref 35.0–45.0)
Hemoglobin: 15.1 g/dL (ref 11.7–15.5)
Lymphs Abs: 2234 cells/uL (ref 850–3900)
MCH: 32.6 pg (ref 27.0–33.0)
MCHC: 34.5 g/dL (ref 32.0–36.0)
MCV: 94.6 fL (ref 80.0–100.0)
MPV: 9.8 fL (ref 7.5–12.5)
Monocytes Relative: 11.4 %
Neutro Abs: 4393 cells/uL (ref 1500–7800)
Neutrophils Relative %: 57.8 %
Platelets: 218 10*3/uL (ref 140–400)
RBC: 4.63 10*6/uL (ref 3.80–5.10)
RDW: 13 % (ref 11.0–15.0)
Total Lymphocyte: 29.4 %
WBC: 7.6 10*3/uL (ref 3.8–10.8)

## 2020-07-03 LAB — B12 AND FOLATE PANEL
Folate: >24 ng/mL
Vitamin B-12: >2000 pg/mL — ABNORMAL HIGH (ref 200–1100)

## 2020-07-03 LAB — IRON,TIBC AND FERRITIN PANEL
%SAT: 38 % (calc) (ref 16–45)
Ferritin: 177 ng/mL (ref 16–288)
Iron: 131 ug/dL (ref 45–160)
TIBC: 344 mcg/dL (calc) (ref 250–450)

## 2020-07-03 LAB — RETICULOCYTES
ABS Retic: 106490 cells/uL — ABNORMAL HIGH (ref 20000–8000)
Retic Ct Pct: 2.3 %

## 2020-07-29 ENCOUNTER — Other Ambulatory Visit: Payer: Self-pay | Admitting: Family Medicine

## 2020-07-29 ENCOUNTER — Other Ambulatory Visit: Payer: Self-pay | Admitting: Gastroenterology

## 2020-07-29 DIAGNOSIS — J449 Chronic obstructive pulmonary disease, unspecified: Secondary | ICD-10-CM | POA: Diagnosis not present

## 2020-07-29 DIAGNOSIS — I509 Heart failure, unspecified: Secondary | ICD-10-CM | POA: Diagnosis not present

## 2020-08-03 NOTE — Telephone Encounter (Signed)
Can patient get a prescription?

## 2020-08-04 ENCOUNTER — Other Ambulatory Visit: Payer: Self-pay

## 2020-08-04 NOTE — Telephone Encounter (Signed)
Patient is requesting a refill

## 2020-08-29 DIAGNOSIS — I509 Heart failure, unspecified: Secondary | ICD-10-CM | POA: Diagnosis not present

## 2020-08-29 DIAGNOSIS — J449 Chronic obstructive pulmonary disease, unspecified: Secondary | ICD-10-CM | POA: Diagnosis not present

## 2020-09-29 DIAGNOSIS — J449 Chronic obstructive pulmonary disease, unspecified: Secondary | ICD-10-CM | POA: Diagnosis not present

## 2020-10-06 ENCOUNTER — Telehealth: Payer: Self-pay | Admitting: Cardiovascular Disease

## 2020-10-06 NOTE — Telephone Encounter (Signed)
Patient declined recall.  Deleting per patient request as she states she "doesn't have a heart problem "

## 2020-10-27 DIAGNOSIS — J449 Chronic obstructive pulmonary disease, unspecified: Secondary | ICD-10-CM | POA: Diagnosis not present

## 2020-11-10 ENCOUNTER — Other Ambulatory Visit: Payer: Self-pay | Admitting: Family Medicine

## 2020-11-13 ENCOUNTER — Encounter: Payer: Self-pay | Admitting: Family Medicine

## 2020-11-13 ENCOUNTER — Other Ambulatory Visit: Payer: Self-pay

## 2020-11-13 ENCOUNTER — Ambulatory Visit (INDEPENDENT_AMBULATORY_CARE_PROVIDER_SITE_OTHER): Payer: Medicare Other | Admitting: Family Medicine

## 2020-11-13 VITALS — BP 170/82 | HR 81 | Temp 97.9°F | Ht 65.0 in | Wt 248.7 lb

## 2020-11-13 DIAGNOSIS — I10 Essential (primary) hypertension: Secondary | ICD-10-CM

## 2020-11-13 DIAGNOSIS — D539 Nutritional anemia, unspecified: Secondary | ICD-10-CM | POA: Diagnosis not present

## 2020-11-13 DIAGNOSIS — R0600 Dyspnea, unspecified: Secondary | ICD-10-CM

## 2020-11-13 DIAGNOSIS — D500 Iron deficiency anemia secondary to blood loss (chronic): Secondary | ICD-10-CM

## 2020-11-13 LAB — HEMOGLOBIN, FINGERSTICK: POC HEMOGLOBIN: 8.5 g/dL — ABNORMAL LOW (ref 12.0–15.0)

## 2020-11-13 MED ORDER — PRAVASTATIN SODIUM 40 MG PO TABS: 40.0000 mg | ORAL_TABLET | Freq: Every day | ORAL | 3 refills | Status: DC

## 2020-11-13 MED ORDER — DOXAZOSIN MESYLATE 2 MG PO TABS
2.0000 mg | ORAL_TABLET | Freq: Every day | ORAL | 1 refills | Status: DC
Start: 2020-11-13 — End: 2020-12-10

## 2020-11-13 NOTE — Addendum Note (Signed)
Addended by: Amalia Hailey on: 11/13/2020 02:51 PM   Modules accepted: Orders

## 2020-11-13 NOTE — Progress Notes (Signed)
Subjective:    Patient ID: Virginia Walters, female    DOB: 03/24/1941, 80 y.o.   MRN: [id number]  HPI  02/07/20 Patient is a very pleasant 80 year old Caucasian female who presents today with her family member.  She has a history of mild dementia.  She presents with fatigue.  Recently her family member has had to increase her oxygen level to help her breathe.  However her oxygen saturation has consistently been 98% irregardless of her oxygen supplementation.  Today she is 98% on room air having not had any oxygen in more than 30 minutes.  Therefore I do not believe the oxygen is the reason that she is short of breath.  Rather than shortness of breath, the patient is actually endorsing fatigue and easy fatigability.  She is mildly tachycardic on exam today.  She also appears somewhat dehydrated.  Her conjunctiva are extremely pale as are her lips and skin.  She appears to be anemic.  She denies any chest pain.  She denies any cough.  She denies any abdominal pain.  She denies any melena or hematochezia.  She denies any epistaxis or bruising.  She denies any dysuria.  She denies any vomiting or diarrhea.  At that time, my plan was: After our history and physical, I believe the patient is suffering more from severe fatigue and easy fatigability rather than shortness of breath.  She is dehydrated on exam.  She is mildly tachycardic.  She is also very pale.  I am concerned that the patient may be anemic.  I will check a CBC, CMP, B12, TSH, and iron level to evaluate further.  If she is severely anemic, she will likely need GI consultation to rule out GI sources of bleeding as well as I suspect iron deficiency anemia.  Await the results of the lab work to determine the next step in the work-up.  02/21/20 Labs showed a hemoglobin of 6.1!.  Therefore she was referred to the emergency room for evaluation suspecting a GI bleed.  Admit date: 02/10/2020 Discharge date: 02/11/2020  Admitted From:  Home Disposition:  Home  Recommendations for Outpatient Follow-up:  1. Follow up with PCP in 1-2 weeks 2. Follow-up with gastroenterology in 2 to 3 weeks 3. Please obtain BMP/CBC in one week 4. Please follow up on the following pending results: None  Home Health: No Equipment/Devices: Home oxygen Discharge Condition: Stable CODE STATUS: Full Diet recommendation: Heart Healthy   Brief/Interim Summary: Virginia I Murrellis a 80 y.o.femalewith medical history significant forsystolic heart failure , last EF 45%on as needed oxygen at home at 2 L%, HTN, glaucoma, who was sent to the ER by her PCP for hemoglobin of 6.1 after patient presented 3 days prior with a complaint of fatigue . Work-up in the outpatient revealed hemoglobin 6.1, iron 19, B12 over 2000 and TSH 1.4. Creatinine at that time was 1.61. Patient denies black or bloody stool or any change in bowel habits. Denies abdominal pain. Denies nausea or vomiting. Denies any bleeding or bruising. Denies chest painor shortness of breath. Has had no recent acute illness, denies cough, fever or chills. Said she has been using Aleve twice a day for the past year but denies abdominal pain.  Also denied rectal exam for stool guaiac. She was given 2 unit of PRBCs, hemoglobin on discharge day was 8.3. He was taken for EGD by GI in the morning.  It shows multiple nonbleeding gastric ulcerations.  Biopsies were taken for H. pylori and pathology.  She was advised to avoid all the NSAIDs and take Protonix 40 mg twice daily for next 8 weeks.  She will follow-up with gastroenterology as an outpatient. She had low iron at 12.  Given iron supplement and will need to follow-up with primary care physician for further management.  Patient has an history of chronic systolic heart failure, EF of 45 to 50% on echo done in December 2019.  No concern for acute decompensation.  She will follow-up with her cardiologist and continue with her home  meds.  Her home antihypertensives were held initially due to some softer blood pressure which improved after transfusion.  She will resume her home medications and follow-up with her primary care physician.  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 - Non-bleeding gastric ulcers with a clean ulcer base ([place] Class III). - Medium-sized hiatal hernia. 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 Treated with argon plasma coagulation (APC). - No specimens collected. - Return patient to hospital ward for ongoing care. - Continue present medications. - The findings and recommendations were discussed with the patient. - Perform an H. pylori serology today. - Use Protonix (pantoprazole) 40 mg PO BID for 8 weeks. - Pt is refusing a colonoscopy at this time. If hemoglobin stays stable pt to follow up in GI clinic as an outpatient to set up elective colonoscopy. If hemoglobin drops or active bleeding occurs pt would need an inpatient colonoscopy if she is agreeable. At this time she would like to defer colonoscopy - Return to my office in 2 weeks.  02/21/20 Patient is here today in my office unaccompanied.  Patient drove herself.  She is very confused as to what happened in the hospital.  She was not certain what happened or what they did.  She is also uncertain of the medication that she is taking.  She does not know if she is taking her iron pill.  She does recognize the name Protonix but she is not certain if she is taking it twice a day.  She denies taking any NSAIDs.  She does occasionally drink a glass of wine.  I encouraged her to avoid all alcohol and NSAIDs due to the ulcer seen  in her stomach.  She denies any melena or bright red blood per rectum.  She denies any abdominal pain.  She has an appointment scheduled to see the gastroenterologist on the 21.  Colonoscopy has not been performed yet.  At that time, my plan was: Patient is doing much better since her hospitalization after the transfusion.  However I am concerned that her dementia is worsening.  She seems more confused and has a difficult time answering questions.  I have previously told the patient I did not feel that she should be driving.  I also feel that she needs to have her medication supervised particularly as she is unable to answer my questions today about what she is taking and how she is taking it.  Therefore I encouraged her to allow her family to prepare her pillbox for her.  I also recommended that we start Aricept 10 mg a day to slow the progression of her dementia.  I will check a CBC  today to ensure that her hemoglobin is not trending further.  If stable or improved, we can wait until the 21st to schedule her colonoscopy.  If worsening, she may need an inpatient colonoscopy.  Continue taking iron and Protonix twice daily.  Avoid all NSAIDs, aspirin, alcohol.  07/02/20 Since I last saw the patient, she had her colonoscopy.  No malignancy was seen.  No source of bleeding was seen.  She is still taking iron.  She is also on Protonix twice a day.  She denies any melena or hematochezia although she does report constipation on the Protonix.  She stopped taking the Aricept.  It caused vivid dreams and mood changes.  Therefore she is on nothing to help preserve her memory.  She is overdue for lab work.  Today she seems more back to her baseline.  She is still living at home independently.  She is getting her groceries.  Her daughter is her power of attorney and is managing her bills.  The only pill that she is responsible for is her credit card.  She drives to the grocery store into this office however she denies  traveling on highways or in the city.  She denies any falls except for 1.  Recently she fell trying to get out of bed to go to the bathroom.  She was able to get herself.  She does have a fall pendant however she did not press the button because she did not want to bother anyone since it was 2 in the morning.  She was able to get herself out of the floor.  She uses a walker around her home just to help stabilize herself mainly due to pain.  She denies any depression.  She does have insight enough to know how to use the fall pendant.  She made the decision not to use dependent when she fell because she did not want to bother anyone.  She is due for a flu shot.   At that time, my plan was: We discussed switching to Namenda but at the present time she is hesitant to start any new medication because of how bad she felt on Aricept.  Therefore she elects not to take anything at the present time.  I will check a CBC, iron level, B12 level, ferritin.  If iron levels are back to normal and hemoglobin is back to normal she can stop iron supplementation.  At that point we would likely back down on the Protonix to just once a day but continue that indefinitely.  She does report some symptoms of overactive bladder.  She denies other symptoms that would make me concerned about NPH.  She refuses any medication for overactive bladder when I discussed potential side effects potentially fall risk and confusion.  She received her flu shot today  11/13/20 Patient is here today reporting increasing dyspnea on exertion.  When I saw her in November, her hemoglobin had improved all the way to 15!  At that point, we recommended she stop iron.  However, she stopped taking her Protonix along with the iron.  She states that over the last month she has become more short of breath with activity.  She reports dyspnea on exertion however she denies any chest pain or chest tightness.  She denies any syncope or near syncope.  She denies any  palpitations or tachyarrhythmia.  She denies any cough or pleurisy or hemoptysis or fever or chills.  She does have trace bipedal edema but she does  not appear fluid overloaded on exam.  Today she is in normal sinus rhythm by exam and her lungs are clear to auscultation bilaterally however her lips and eyelids again appear pale.  I obtained a fingerstick hemoglobin which showed that her hemoglobin has dropped from 15 in November to 8.5 today!.  She denies any melena or hematochezia.  EGD and colonoscopy showed no source of bleeding.  She denies any alcohol consumption however she does drink a lot of soft drinks.  She denies any NSAIDs.  She is not taking aspirin. Past Medical History:  Diagnosis Date  . CHF (congestive heart failure) ([place])   . Dyslipidemia   . Frequent falls   . GERD (gastroesophageal reflux disease)   . HTN (hypertension)   . Memory loss   . PFO (patent foramen ovale)   . Retinal artery occlusion   . Weakness    Past Surgical History:  Procedure Laterality Date  . ANKLE ARTHROSCOPY WITH OPEN REDUCTION INTERNAL FIXATION (ORIF) Right   . APPENDECTOMY    . COLONOSCOPY N/A 07/21/2018   Procedure: COLONOSCOPY;  Surgeon: Lin Landsman, MD;  Location: [facility] ENDOSCOPY;  Service: Gastroenterology;  Laterality: N/A;  . COLONOSCOPY WITH PROPOFOL N/A 04/01/2020   Procedure: COLONOSCOPY WITH PROPOFOL;  Surgeon: Virgel Manifold, MD;  Location: [facility] ENDOSCOPY;  Service: Endoscopy;  Laterality: N/A;  . ESOPHAGOGASTRODUODENOSCOPY N/A 07/21/2018   Procedure: ESOPHAGOGASTRODUODENOSCOPY (EGD);  Surgeon: Lin Landsman, MD;  Location[facility] ENDOSCOPY;  Service: Gastroenterology;  Laterality: N/A;  . ESOPHAGOGASTRODUODENOSCOPY N/A 02/11/2020   Procedure: ESOPHAGOGASTRODUODENOSCOPY (EGD);  Surgeon: Virgel Manifold, MD;  Location: [facility] ENDOSCOPY;  Service: Endoscopy;  Laterality: N/A;  . ESOPHAGOGASTRODUODENOSCOPY (EGD) WITH PROPOFOL N/A 04/01/2020   Procedure:  ESOPHAGOGASTRODUODENOSCOPY (EGD) WITH PROPOFOL;  Surgeon: Virgel Manifold, MD;  Location: [facility] ENDOSCOPY;  Service: Endoscopy;  Laterality: N/A;  . TUBAL LIGATION     Current Outpatient Medications on File Prior to Visit  Medication Sig Dispense Refill  . B Complex-Biotin-FA (B-COMPLEX PO) Take 1 capsule by mouth daily.     . Calcium Carbonate-Vitamin D (CALCIUM + D PO) Take 1 capsule by mouth 2 (two) times daily.    . Cholecalciferol (VITAMIN D) 2000 UNITS CAPS Take 1 capsule by mouth daily.    . dorzolamide (TRUSOPT) 2 % ophthalmic solution Place 1 drop into the left eye 2 (two) times daily.    . ferrous sulfate 325 (65 FE) MG EC tablet Take 1 tablet (325 mg total) by mouth 2 (two) times daily. 60 tablet 11  . fish oil-omega-3 fatty acids 1000 MG capsule Take 2 g by mouth 2 (two) times daily.    . hydrochlorothiazide (HYDRODIURIL) 25 MG tablet Take 1 tablet (25 mg total) by mouth daily. 90 tablet 3  . latanoprost (XALATAN) 0.005 % ophthalmic solution Place 1 drop into the left eye at bedtime.     Marland Kitchen lisinopril (ZESTRIL) 40 MG tablet Take 40 mg by mouth daily.    . melatonin 5 MG TABS Take 5 mg by mouth at bedtime.    . Multiple Vitamin (MULTIVITAMIN) tablet Take 1 tablet by mouth daily.    . Na Sulfate-K Sulfate-Mg Sulf 17.5-3.13-1.6 GM/177ML SOLN At 5 PM the day before procedure take 1 bottle and 5 hours before procedure take 1 bottle. 354 mL 0  . potassium chloride (KLOR-CON) 10 MEQ tablet TAKE 1 TABLET BY MOUTH  DAILY 90 tablet 3  . SYMBICORT 160-4.5 MCG/ACT inhaler USE 2 INHALATIONS BY MOUTH  TWICE DAILY 30.6 g  3  . traZODone (DESYREL) 50 MG tablet Take 1 tablet (50 mg total) by mouth at bedtime. 90 tablet 3  . Turmeric 500 MG CAPS Take 500 mg by mouth daily.     Marland Kitchen venlafaxine XR (EFFEXOR-XR) 75 MG 24 hr capsule TAKE 1 CAPSULE BY MOUTH  DAILY WITH BREAKFAST 90 capsule 3  . verapamil (CALAN-SR) 240 MG CR tablet TAKE 1 TABLET BY MOUTH AT  BEDTIME 90 tablet 3  . vitamin E 180 MG (400  UNITS) capsule Take 180 mg by mouth daily.     No current facility-administered medications on file prior to visit.   Allergies  Allergen Reactions  . Oxycodone Itching   Social History   Socioeconomic History  . Marital status: Widowed    Spouse name: Not on file  . Number of children: 1  . Years of education: HS  . Highest education level: Not on file  Occupational History  . Occupation: Retired  Tobacco Use  . Smoking status: Former Smoker    Packs/day: 1.00    Years: 15.00    Pack years: 15.00    Types: Cigarettes    Quit date: 12/27/1980    Years since quitting: 39.9  . Smokeless tobacco: Never Used  Vaping Use  . Vaping Use: Never used  Substance and Sexual Activity  . Alcohol use: Yes    Comment: occassionally  . Drug use: No  . Sexual activity: Not Currently  Other Topics Concern  . Not on file  Social History Narrative   Lives at home alone.   Right-handed.   No daily use of caffeine.   Social Determinants of Health   Financial Resource Strain: Low Risk   . Difficulty of Paying Living Expenses: Not very hard  Food Insecurity: Not on file  Transportation Needs: Not on file  Physical Activity: Not on file  Stress: Not on file  Social Connections: Not on file  Intimate Partner Violence: Not on file     Review of Systems  Respiratory: Positive for shortness of breath.   Musculoskeletal: Positive for back pain.  All other systems reviewed and are negative.      Objective:   Physical Exam Constitutional:      General: She is not in acute distress.    Appearance: She is not ill-appearing, toxic-appearing or diaphoretic.  HENT:     Mouth/Throat:     Mouth: Mucous membranes are moist.  Neck:     Vascular: No carotid bruit.  Cardiovascular:     Rate and Rhythm: Normal rate and regular rhythm.     Heart sounds: Normal heart sounds. No murmur heard.   Pulmonary:     Effort: Pulmonary effort is normal. No respiratory distress.     Breath  sounds: No stridor. No wheezing, rhonchi or rales.  Chest:     Chest wall: No tenderness.  Musculoskeletal:     Right lower leg: No edema.     Left lower leg: No edema.  Neurological:     General: No focal deficit present.     Mental Status: She is alert. Mental status is at baseline.     Cranial Nerves: Cranial nerves are intact.     Sensory: Sensation is intact.     Coordination: Coordination is intact.     Gait: Gait is intact.           Assessment & Plan:  Dyspnea, unspecified type - Plan: Hemoglobin, fingerstick, CBC with Differential/Platelet, COMPLETE METABOLIC PANEL WITH GFR, Brain  natriuretic peptide, Anemia panel  Iron deficiency anemia due to chronic blood loss  Essential hypertension  At this point I believe her dyspnea is likely due to the drop in her hemoglobin coupled with her underlying COPD coupled with elevated blood pressure.  I want to treat her anemia by starting her on iron sulfate 325 mg twice a day.  I suspect that she might have some gastritis or mild ulceration in her small intestine or stomach so I will have her resume Protonix 40 mg a day.  Discontinue any alcohol or spicy foods or acidic beverages.  Check anemia panel, confirm with a CBC, check CMP and BNP.  Start doxazosin 2 mg a day to help lower her blood pressure.  Recheck next week or seek medical attention immediately if shortness of breath worsens.

## 2020-11-13 NOTE — Addendum Note (Signed)
Addended by: Jenna Luo T on: 11/13/2020 04:48 PM   Modules accepted: Orders

## 2020-11-14 LAB — COMPLETE METABOLIC PANEL WITH GFR
AG Ratio: 1.8 (calc) (ref 1.0–2.5)
ALT: 30 U/L — ABNORMAL HIGH (ref 6–29)
AST: 24 U/L (ref 10–35)
Albumin: 4.3 g/dL (ref 3.6–5.1)
Alkaline phosphatase (APISO): 82 U/L (ref 37–153)
BUN/Creatinine Ratio: 18 (calc) (ref 6–22)
BUN: 24 mg/dL (ref 7–25)
CO2: 23 mmol/L (ref 20–32)
Calcium: 9.9 mg/dL (ref 8.6–10.4)
Chloride: 107 mmol/L (ref 98–110)
Creat: 1.31 mg/dL — ABNORMAL HIGH (ref 0.60–0.93)
GFR, Est African American: 45 mL/min/{1.73_m2} — ABNORMAL LOW (ref >=60)
GFR, Est Non African American: 39 mL/min/{1.73_m2} — ABNORMAL LOW (ref >=60)
Globulin: 2.4 g/dL (calc) (ref 1.9–3.7)
Glucose, Bld: 112 mg/dL — ABNORMAL HIGH (ref 65–99)
Potassium: 4.7 mmol/L (ref 3.5–5.3)
Sodium: 141 mmol/L (ref 135–146)
Total Bilirubin: 0.3 mg/dL (ref 0.2–1.2)
Total Protein: 6.7 g/dL (ref 6.1–8.1)

## 2020-11-14 LAB — RETICULOCYTES
ABS Retic: 103850 cells/uL — ABNORMAL HIGH (ref 20000–8000)
Retic Ct Pct: 3.1 %

## 2020-11-14 LAB — CBC WITH DIFFERENTIAL/PLATELET
Absolute Monocytes: 1058 cells/uL — ABNORMAL HIGH (ref 200–950)
Basophils Absolute: 81 cells/uL (ref 0–200)
Basophils Relative: 1.1 %
Eosinophils Absolute: 170 cells/uL (ref 15–500)
Eosinophils Relative: 2.3 %
HCT: 27.7 % — ABNORMAL LOW (ref 35.0–45.0)
Hemoglobin: 8.4 g/dL — ABNORMAL LOW (ref 11.7–15.5)
Lymphs Abs: 2065 cells/uL (ref 850–3900)
MCH: 26.3 pg — ABNORMAL LOW (ref 27.0–33.0)
MCHC: 30.3 g/dL — ABNORMAL LOW (ref 32.0–36.0)
MCV: 86.6 fL (ref 80.0–100.0)
MPV: 9.4 fL (ref 7.5–12.5)
Monocytes Relative: 14.3 %
Neutro Abs: 4026 cells/uL (ref 1500–7800)
Neutrophils Relative %: 54.4 %
Platelets: 350 10*3/uL (ref 140–400)
RBC: 3.2 10*6/uL — ABNORMAL LOW (ref 3.80–5.10)
RDW: 14.1 % (ref 11.0–15.0)
Total Lymphocyte: 27.9 %
WBC: 7.4 10*3/uL (ref 3.8–10.8)

## 2020-11-14 LAB — B12 AND FOLATE PANEL
Folate: >24 ng/mL
Vitamin B-12: >2000 pg/mL — ABNORMAL HIGH (ref 200–1100)

## 2020-11-14 LAB — BRAIN NATRIURETIC PEPTIDE: Brain Natriuretic Peptide: 62 pg/mL (ref <100)

## 2020-11-14 LAB — IRON,TIBC AND FERRITIN PANEL
%SAT: 3 % (calc) — ABNORMAL LOW (ref 16–45)
Ferritin: 5 ng/mL — ABNORMAL LOW (ref 16–288)
Iron: 14 ug/dL — ABNORMAL LOW (ref 45–160)
TIBC: 462 mcg/dL (calc) — ABNORMAL HIGH (ref 250–450)

## 2020-11-17 ENCOUNTER — Encounter: Payer: Self-pay | Admitting: *Deleted

## 2020-11-20 ENCOUNTER — Encounter: Payer: Self-pay | Admitting: Family Medicine

## 2020-11-20 ENCOUNTER — Other Ambulatory Visit: Payer: Self-pay

## 2020-11-20 ENCOUNTER — Ambulatory Visit (INDEPENDENT_AMBULATORY_CARE_PROVIDER_SITE_OTHER): Payer: Medicare Other | Admitting: Family Medicine

## 2020-11-20 VITALS — BP 128/60 | HR 80 | Temp 97.9°F | Resp 18 | Ht 65.0 in | Wt 242.0 lb

## 2020-11-20 DIAGNOSIS — D500 Iron deficiency anemia secondary to blood loss (chronic): Secondary | ICD-10-CM | POA: Diagnosis not present

## 2020-11-20 LAB — CBC WITH DIFFERENTIAL/PLATELET
Absolute Monocytes: 714 cells/uL (ref 200–950)
Basophils Absolute: 77 cells/uL (ref 0–200)
Basophils Relative: 1.1 %
Eosinophils Absolute: 126 cells/uL (ref 15–500)
Eosinophils Relative: 1.8 %
HCT: 29.4 % — ABNORMAL LOW (ref 35.0–45.0)
Hemoglobin: 8.7 g/dL — ABNORMAL LOW (ref 11.7–15.5)
Lymphs Abs: 1540 cells/uL (ref 850–3900)
MCH: 25.4 pg — ABNORMAL LOW (ref 27.0–33.0)
MCHC: 29.6 g/dL — ABNORMAL LOW (ref 32.0–36.0)
MCV: 85.7 fL (ref 80.0–100.0)
MPV: 9.2 fL (ref 7.5–12.5)
Monocytes Relative: 10.2 %
Neutro Abs: 4543 cells/uL (ref 1500–7800)
Neutrophils Relative %: 64.9 %
Platelets: 313 10*3/uL (ref 140–400)
RBC: 3.43 10*6/uL — ABNORMAL LOW (ref 3.80–5.10)
RDW: 15.6 % — ABNORMAL HIGH (ref 11.0–15.0)
Total Lymphocyte: 22 %
WBC: 7 10*3/uL (ref 3.8–10.8)

## 2020-11-20 NOTE — Progress Notes (Signed)
Subjective:    Patient ID: Virginia Walters, female    DOB: [birth year], 80 y.o.   MRN: [id number]  Back Pain  Shortness of Breath    02/07/20 Patient is a very pleasant 80 year old Caucasian female who presents today with her family member.  She has a history of mild dementia.  She presents with fatigue.  Recently her family member has had to increase her oxygen level to help her breathe.  However her oxygen saturation has consistently been 98% irregardless of her oxygen supplementation.  Today she is 98% on room air having not had any oxygen in more than 30 minutes.  Therefore I do not believe the oxygen is the reason that she is short of breath.  Rather than shortness of breath, the patient is actually endorsing fatigue and easy fatigability.  She is mildly tachycardic on exam today.  She also appears somewhat dehydrated.  Her conjunctiva are extremely pale as are her lips and skin.  She appears to be anemic.  She denies any chest pain.  She denies any cough.  She denies any abdominal pain.  She denies any melena or hematochezia.  She denies any epistaxis or bruising.  She denies any dysuria.  She denies any vomiting or diarrhea.  At that time, my plan was: After our history and physical, I believe the patient is suffering more from severe fatigue and easy fatigability rather than shortness of breath.  She is dehydrated on exam.  She is mildly tachycardic.  She is also very pale.  I am concerned that the patient may be anemic.  I will check a CBC, CMP, B12, TSH, and iron level to evaluate further.  If she is severely anemic, she will likely need GI consultation to rule out GI sources of bleeding as well as I suspect iron deficiency anemia.  Await the results of the lab work to determine the next step in the work-up.  02/21/20 Labs showed a hemoglobin of 6.1!.  Therefore she was referred to the emergency room for evaluation suspecting a GI bleed.  Admit date: 02/10/2020 Discharge date:  02/11/2020  Admitted From: Home Disposition:  Home  Recommendations for Outpatient Follow-up:  1. Follow up with PCP in 1-2 weeks 2. Follow-up with gastroenterology in 2 to 3 weeks 3. Please obtain BMP/CBC in one week 4. Please follow up on the following pending results: None  Home Health: No Equipment/Devices: Home oxygen Discharge Condition: Stable CODE STATUS: Full Diet recommendation: Heart Healthy   Brief/Interim Summary: Virginia I Murrellis a 80 y.o.femalewith medical history significant forsystolic heart failure , last EF 45%on as needed oxygen at home at 2 L%, HTN, glaucoma, who was sent to the ER by her PCP for hemoglobin of 6.1 after patient presented 3 days prior with a complaint of fatigue . Work-up in the outpatient revealed hemoglobin 6.1, iron 19, B12 over 2000 and TSH 1.4. Creatinine at that time was 1.61. Patient denies black or bloody stool or any change in bowel habits. Denies abdominal pain. Denies nausea or vomiting. Denies any bleeding or bruising. Denies chest painor shortness of breath. Has had no recent acute illness, denies cough, fever or chills. Said she has been using Aleve twice a day for the past year but denies abdominal pain.  Also denied rectal exam for stool guaiac. She was given 2 unit of PRBCs, hemoglobin on discharge day was 8.3. He was taken for EGD by GI in the morning.  It shows multiple nonbleeding gastric ulcerations.  Biopsies  were taken for H. pylori and pathology.  She was advised to avoid all the NSAIDs and take Protonix 40 mg twice daily for next 8 weeks.  She will follow-up with gastroenterology as an outpatient. She had low iron at 12.  Given iron supplement and will need to follow-up with primary care physician for further management.  Patient has an history of chronic systolic heart failure, EF of 45 to 50% on echo done in December 2019.  No concern for acute decompensation.  She will follow-up with her cardiologist and  continue with her home meds.  Her home antihypertensives were held initially due to some softer blood pressure which improved after transfusion.  She will resume her home medications and follow-up with her primary care physician.  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 - Non-bleeding gastric ulcers with a clean ulcer base ([place] Class III). - Medium-sized hiatal hernia. 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 Treated with argon plasma coagulation (APC). - No specimens collected. - Return patient to hospital ward for ongoing care. - Continue present medications. - The findings and recommendations were discussed with the patient. - Perform an H. pylori serology today. - Use Protonix (pantoprazole) 40 mg PO BID for 8 weeks. - Pt is refusing a colonoscopy at this time. If hemoglobin stays stable pt to follow up in GI clinic as an outpatient to set up elective colonoscopy. If hemoglobin drops or active bleeding occurs pt would need an inpatient colonoscopy if she is agreeable. At this time she would like to defer colonoscopy - Return to my office in 2 weeks.  02/21/20 Patient is here today in my office unaccompanied.  Patient drove herself.  She is very confused as to what happened in the hospital.  She was not certain what happened or what they did.  She is also uncertain of the medication that she is taking.  She does not know if she is taking her iron pill.  She does recognize the name Protonix but she is not certain if she is taking it twice a day.  She denies taking any NSAIDs.  She does occasionally drink a glass of wine.  I encouraged her to avoid all alcohol and  NSAIDs due to the ulcer seen in her stomach.  She denies any melena or bright red blood per rectum.  She denies any abdominal pain.  She has an appointment scheduled to see the gastroenterologist on the 21.  Colonoscopy has not been performed yet.  At that time, my plan was: Patient is doing much better since her hospitalization after the transfusion.  However I am concerned that her dementia is worsening.  She seems more confused and has a difficult time answering questions.  I have previously told the patient I did not feel that she should be driving.  I also feel that she needs to have her medication supervised particularly as she is unable to answer my questions today about what she is taking and how she is taking it.  Therefore I encouraged her to allow her family to prepare her pillbox for her.  I also recommended that we start Aricept 10 mg a day to slow the progression of  her dementia.  I will check a CBC today to ensure that her hemoglobin is not trending further.  If stable or improved, we can wait until the 21st to schedule her colonoscopy.  If worsening, she may need an inpatient colonoscopy.  Continue taking iron and Protonix twice daily.  Avoid all NSAIDs, aspirin, alcohol.  07/02/20 Since I last saw the patient, she had her colonoscopy.  No malignancy was seen.  No source of bleeding was seen.  She is still taking iron.  She is also on Protonix twice a day.  She denies any melena or hematochezia although she does report constipation on the Protonix.  She stopped taking the Aricept.  It caused vivid dreams and mood changes.  Therefore she is on nothing to help preserve her memory.  She is overdue for lab work.  Today she seems more back to her baseline.  She is still living at home independently.  She is getting her groceries.  Her daughter is her power of attorney and is managing her bills.  The only pill that she is responsible for is her credit card.  She drives to the grocery store into this  office however she denies traveling on highways or in the city.  She denies any falls except for 1.  Recently she fell trying to get out of bed to go to the bathroom.  She was able to get herself.  She does have a fall pendant however she did not press the button because she did not want to bother anyone since it was 2 in the morning.  She was able to get herself out of the floor.  She uses a walker around her home just to help stabilize herself mainly due to pain.  She denies any depression.  She does have insight enough to know how to use the fall pendant.  She made the decision not to use dependent when she fell because she did not want to bother anyone.  She is due for a flu shot.   At that time, my plan was: We discussed switching to Namenda but at the present time she is hesitant to start any new medication because of how bad she felt on Aricept.  Therefore she elects not to take anything at the present time.  I will check a CBC, iron level, B12 level, ferritin.  If iron levels are back to normal and hemoglobin is back to normal she can stop iron supplementation.  At that point we would likely back down on the Protonix to just once a day but continue that indefinitely.  She does report some symptoms of overactive bladder.  She denies other symptoms that would make me concerned about NPH.  She refuses any medication for overactive bladder when I discussed potential side effects potentially fall risk and confusion.  She received her flu shot today  11/13/20 Patient is here today reporting increasing dyspnea on exertion.  When I saw her in November, her hemoglobin had improved all the way to 15!  At that point, we recommended she stop iron.  However, she stopped taking her Protonix along with the iron.  She states that over the last month she has become more short of breath with activity.  She reports dyspnea on exertion however she denies any chest pain or chest tightness.  She denies any syncope or near  syncope.  She denies any palpitations or tachyarrhythmia.  She denies any cough or pleurisy or hemoptysis or fever or chills.  She  does have trace bipedal edema but she does not appear fluid overloaded on exam.  Today she is in normal sinus rhythm by exam and her lungs are clear to auscultation bilaterally however her lips and eyelids again appear pale.  I obtained a fingerstick hemoglobin which showed that her hemoglobin has dropped from 15 in November to 8.5 today!.  She denies any melena or hematochezia.  EGD and colonoscopy showed no source of bleeding.  She denies any alcohol consumption however she does drink a lot of soft drinks.  She denies any NSAIDs.  She is not taking aspirin.  At that time, my plan was: At this point I believe her dyspnea is likely due to the drop in her hemoglobin coupled with her underlying COPD coupled with elevated blood pressure.  I want to treat her anemia by starting her on iron sulfate 325 mg twice a day.  I suspect that she might have some gastritis or mild ulceration in her small intestine or stomach so I will have her resume Protonix 40 mg a day.  Discontinue any alcohol or spicy foods or acidic beverages.  Check anemia panel, confirm with a CBC, check CMP and BNP.  Start doxazosin 2 mg a day to help lower her blood pressure.  Recheck next week or seek medical attention immediately if shortness of breath worsens.  11/20/20 I rechecked her blood pressure and found it to be good.  She seems to be tolerating the doxazosin well.  She has not noticed any change yet in her breathing.  She is still short of breath with activity.  Her lab work did however show profound iron deficiency anemia: No visits with results within 1 Week(s) from this visit.  Latest known visit with results is:  Office Visit on 11/13/2020  Component Date Value Ref Range Status  . POC HEMOGLOBIN 11/13/2020 8.5* 12.0 - 15.0 g/dL Final  . WBC 11/13/2020 7.4  3.8 - 10.8 Thousand/uL Final  . RBC 11/13/2020  3.20* 3.80 - 5.10 Million/uL Final  . Hemoglobin 11/13/2020 8.4* 11.7 - 15.5 g/dL Final  . HCT 11/13/2020 27.7* 35.0 - 45.0 % Final  . MCV 11/13/2020 86.6  80.0 - 100.0 fL Final  . MCH 11/13/2020 26.3* 27.0 - 33.0 pg Final  . MCHC 11/13/2020 30.3* 32.0 - 36.0 g/dL Final  . RDW 11/13/2020 14.1  11.0 - 15.0 % Final  . Platelets 11/13/2020 350  140 - 400 Thousand/uL Final  . MPV 11/13/2020 9.4  7.5 - 12.5 fL Final  . Neutro Abs 11/13/2020 4,026  1,500 - 7,800 cells/uL Final  . Lymphs Abs 11/13/2020 2,065  850 - 3,900 cells/uL Final  . Absolute Monocytes 11/13/2020 1,058* 200 - 950 cells/uL Final  . Eosinophils Absolute 11/13/2020 170  15 - 500 cells/uL Final  . Basophils Absolute 11/13/2020 81  0 - 200 cells/uL Final  . Neutrophils Relative % 11/13/2020 54.4  % Final  . Total Lymphocyte 11/13/2020 27.9  % Final  . Monocytes Relative 11/13/2020 14.3  % Final  . Eosinophils Relative 11/13/2020 2.3  % Final  . Basophils Relative 11/13/2020 1.1  % Final  . Glucose, Bld 11/13/2020 112* 65 - 99 mg/dL Final   Comment: .            Fasting reference interval . For someone without known diabetes, a glucose value between 100 and 125 mg/dL is consistent with prediabetes and should be confirmed with a follow-up test. .   . BUN 11/13/2020 24  7 -  25 mg/dL Final  . Creat 11/13/2020 1.31* 0.60 - 0.93 mg/dL Final   Comment: For patients >45 years of age, the reference limit for Creatinine is approximately 13% higher for people identified as African-American. .   . GFR, Est Non African American 11/13/2020 39* > OR = 60 mL/min/1.46m2 Final  . GFR, Est African American 11/13/2020 45* > OR = 60 mL/min/1.53m2 Final  . BUN/Creatinine Ratio 11/13/2020 18  6 - 22 (calc) Final  . Sodium 11/13/2020 141  135 - 146 mmol/L Final  . Potassium 11/13/2020 4.7  3.5 - 5.3 mmol/L Final  . Chloride 11/13/2020 107  98 - 110 mmol/L Final  . CO2 11/13/2020 23  20 - 32 mmol/L Final  . Calcium 11/13/2020 9.9  8.6 -  10.4 mg/dL Final  . Total Protein 11/13/2020 6.7  6.1 - 8.1 g/dL Final  . Albumin 11/13/2020 4.3  3.6 - 5.1 g/dL Final  . Globulin 11/13/2020 2.4  1.9 - 3.7 g/dL (calc) Final  . AG Ratio 11/13/2020 1.8  1.0 - 2.5 (calc) Final  . Total Bilirubin 11/13/2020 0.3  0.2 - 1.2 mg/dL Final  . Alkaline phosphatase (APISO) 11/13/2020 82  37 - 153 U/L Final  . AST 11/13/2020 24  10 - 35 U/L Final  . ALT 11/13/2020 30* 6 - 29 U/L Final  . Brain Natriuretic Peptide 11/13/2020 62  <100 pg/mL Final   Comment: . BNP levels increase with age in the general population with the highest values seen in individuals greater than 60 years of age. Reference: J. Am. [place] Ar. Cardiol. 2002; 38:466-599. .   . Iron 11/13/2020 14* 45 - 160 mcg/dL Final  . TIBC 11/13/2020 462* 250 - 450 mcg/dL (calc) Final  . %SAT 11/13/2020 3* 16 - 45 % (calc) Final  . Ferritin 11/13/2020 5* 16 - 288 ng/mL Final  . Retic Ct Pct 11/13/2020 3.1  % Final  . ABS Retic 11/13/2020 103,850* 20,000 - 8,000 cells/uL Final  . Vitamin B-12 11/13/2020 >2,000* 200 - 1,100 pg/mL Final  . Folate 11/13/2020 >24.0  ng/mL Final   Comment:                            Reference Range                            Low:           <3.4                            Borderline:    3.4-5.4                            Normal:        >5.4 .    She is now taking ferrous sulfate 325 mg twice a day Past Medical History:  Diagnosis Date  . CHF (congestive heart failure) ([place])   . Dyslipidemia   . Frequent falls   . GERD (gastroesophageal reflux disease)   . HTN (hypertension)   . Memory loss   . PFO (patent foramen ovale)   . Retinal artery occlusion   . Weakness    Past Surgical History:  Procedure Laterality Date  . ANKLE ARTHROSCOPY WITH OPEN REDUCTION INTERNAL FIXATION (ORIF) Right   . APPENDECTOMY    . COLONOSCOPY N/A 07/21/2018  Procedure: COLONOSCOPY;  Surgeon: Lin Landsman, MD;  Location[facility] ENDOSCOPY;  Service: Gastroenterology;   Laterality: N/A;  . COLONOSCOPY WITH PROPOFOL N/A 04/01/2020   Procedure: COLONOSCOPY WITH PROPOFOL;  Surgeon: Virgel Manifold, MD;  Location: [facility] ENDOSCOPY;  Service: Endoscopy;  Laterality: N/A;  . ESOPHAGOGASTRODUODENOSCOPY N/A 07/21/2018   Procedure: ESOPHAGOGASTRODUODENOSCOPY (EGD);  Surgeon: Lin Landsman, MD;  Location: [facility] ENDOSCOPY;  Service: Gastroenterology;  Laterality: N/A;  . ESOPHAGOGASTRODUODENOSCOPY N/A 02/11/2020   Procedure: ESOPHAGOGASTRODUODENOSCOPY (EGD);  Surgeon: Virgel Manifold, MD;  Location: [facility] ENDOSCOPY;  Service: Endoscopy;  Laterality: N/A;  . ESOPHAGOGASTRODUODENOSCOPY (EGD) WITH PROPOFOL N/A 04/01/2020   Procedure: ESOPHAGOGASTRODUODENOSCOPY (EGD) WITH PROPOFOL;  Surgeon: Virgel Manifold, MD;  Location: [facility] ENDOSCOPY;  Service: Endoscopy;  Laterality: N/A;  . TUBAL LIGATION     Current Outpatient Medications on File Prior to Visit  Medication Sig Dispense Refill  . B Complex-Biotin-FA (B-COMPLEX PO) Take 1 capsule by mouth daily.     . Calcium Carbonate-Vitamin D (CALCIUM + D PO) Take 1 capsule by mouth 2 (two) times daily.    . Cholecalciferol (VITAMIN D) 2000 UNITS CAPS Take 1 capsule by mouth daily.    . dorzolamide (TRUSOPT) 2 % ophthalmic solution Place 1 drop into the left eye 2 (two) times daily.    Marland Kitchen doxazosin (CARDURA) 2 MG tablet Take 1 tablet (2 mg total) by mouth daily. 30 tablet 1  . ferrous sulfate 325 (65 FE) MG EC tablet Take 1 tablet (325 mg total) by mouth 2 (two) times daily. 60 tablet 11  . fish oil-omega-3 fatty acids 1000 MG capsule Take 2 g by mouth 2 (two) times daily.    . hydrochlorothiazide (HYDRODIURIL) 25 MG tablet Take 1 tablet (25 mg total) by mouth daily. 90 tablet 3  . latanoprost (XALATAN) 0.005 % ophthalmic solution Place 1 drop into the left eye at bedtime.     Marland Kitchen lisinopril (ZESTRIL) 40 MG tablet Take 40 mg by mouth daily.    . melatonin 5 MG TABS Take 5 mg by mouth at bedtime.    . Multiple Vitamin  (MULTIVITAMIN) tablet Take 1 tablet by mouth daily.    . pantoprazole (PROTONIX) 40 MG tablet Take 40 mg by mouth daily.    . potassium chloride (KLOR-CON) 10 MEQ tablet TAKE 1 TABLET BY MOUTH  DAILY 90 tablet 3  . pravastatin (PRAVACHOL) 40 MG tablet Take 1 tablet (40 mg total) by mouth daily. 90 tablet 3  . SYMBICORT 160-4.5 MCG/ACT inhaler USE 2 INHALATIONS BY MOUTH  TWICE DAILY 30.6 g 3  . traZODone (DESYREL) 50 MG tablet Take 1 tablet (50 mg total) by mouth at bedtime. 90 tablet 3  . Turmeric 500 MG CAPS Take 500 mg by mouth daily.     Marland Kitchen venlafaxine XR (EFFEXOR-XR) 75 MG 24 hr capsule TAKE 1 CAPSULE BY MOUTH  DAILY WITH BREAKFAST 90 capsule 3  . verapamil (CALAN-SR) 240 MG CR tablet TAKE 1 TABLET BY MOUTH AT  BEDTIME 90 tablet 3  . vitamin E 180 MG (400 UNITS) capsule Take 180 mg by mouth daily.     No current facility-administered medications on file prior to visit.   Allergies  Allergen Reactions  . Oxycodone Itching   Social History   Socioeconomic History  . Marital status: Widowed    Spouse name: Not on file  . Number of children: 1  . Years of education: HS  . Highest education level: Not on file  Occupational History  .  Occupation: Retired  Tobacco Use  . Smoking status: Former Smoker    Packs/day: 1.00    Years: 15.00    Pack years: 15.00    Types: Cigarettes    Quit date: 12/27/1980    Years since quitting: 39.9  . Smokeless tobacco: Never Used  Vaping Use  . Vaping Use: Never used  Substance and Sexual Activity  . Alcohol use: Yes    Comment: occassionally  . Drug use: No  . Sexual activity: Not Currently  Other Topics Concern  . Not on file  Social History Narrative   Lives at home alone.   Right-handed.   No daily use of caffeine.   Social Determinants of Health   Financial Resource Strain: Low Risk   . Difficulty of Paying Living Expenses: Not very hard  Food Insecurity: Not on file  Transportation Needs: Not on file  Physical Activity: Not on  file  Stress: Not on file  Social Connections: Not on file  Intimate Partner Violence: Not on file     Review of Systems  Respiratory: Positive for shortness of breath.   Musculoskeletal: Positive for back pain.  All other systems reviewed and are negative.      Objective:   Physical Exam Constitutional:      General: She is not in acute distress.    Appearance: She is not ill-appearing, toxic-appearing or diaphoretic.  HENT:     Mouth/Throat:     Mouth: Mucous membranes are moist.  Neck:     Vascular: No carotid bruit.  Cardiovascular:     Rate and Rhythm: Normal rate and regular rhythm.     Heart sounds: Normal heart sounds. No murmur heard.   Pulmonary:     Effort: Pulmonary effort is normal. No respiratory distress.     Breath sounds: No stridor. No wheezing, rhonchi or rales.  Chest:     Chest wall: No tenderness.  Musculoskeletal:     Right lower leg: No edema.     Left lower leg: No edema.  Neurological:     General: No focal deficit present.     Mental Status: She is alert. Mental status is at baseline.     Cranial Nerves: Cranial nerves are intact.     Sensory: Sensation is intact.     Coordination: Coordination is intact.     Gait: Gait is intact.           Assessment & Plan:  Iron deficiency anemia due to chronic blood loss - Plan: CBC with Differential/Platelet  Recheck CBC to ensure no persistent drop or worsening drop in her hemoglobin.  If her hemoglobin is falling below 8 I would recommend going to the hospital.  However if her hemoglobin is stable, I will give the patient 4 weeks on iron sulfate to try to improve her hemoglobin.  I suspect that she likely has a GI source of blood loss however she is already had a thorough work-up.  Therefore if we can get the hemoglobin to improve simply with ferrous sulfate this is all I would do.  However if she is continuing to drop she would likely require capsule endoscopy to try to determine the source of  the bleeding in the small intestine

## 2020-11-23 ENCOUNTER — Encounter: Payer: Self-pay | Admitting: *Deleted

## 2020-11-27 DIAGNOSIS — J449 Chronic obstructive pulmonary disease, unspecified: Secondary | ICD-10-CM | POA: Diagnosis not present

## 2020-12-10 ENCOUNTER — Other Ambulatory Visit: Payer: Self-pay | Admitting: Family Medicine

## 2020-12-23 ENCOUNTER — Telehealth: Payer: Self-pay | Admitting: Pharmacist

## 2020-12-23 NOTE — Progress Notes (Addendum)
Chronic Care Management Pharmacy Assistant   Name: Virginia Walters  MRN: [id number] DOB: 1941/05/30  Reason for Encounter: Disease State For HTN.   Conditions to be addressed/monitored: HTN, Dyslipidemia, GERD, Dementia, COPD, CKD, anemia  Recent office visits:  11/20/20 Dr. Dennard Schaumann For follow-up. For iron deficiency. STOPPED Na Sulfate-K Sulfate-Mg Sulf. No medication changes. Per note: The patient may require capsule endoscopy to try to determine the source of the bleeding in the small intestine. 11/13/20 Dr. Dennard Schaumann For follow-Up. RESTARTED Protonix 40 mg daily. STARTED Doxazosin Mesylate 2 mg daily and iron sulfate 325 mg twice a day 07/02/20 Dr. Dennard Schaumann For follow-Up. No medication changes.   Recent consult visits:  04/28/20 Ophthalmology Marshall Cork D.  Hospital visits:  None in previous 6 months  Medications: Outpatient Encounter Medications as of 12/23/2020  Medication Sig   B Complex-Biotin-FA (B-COMPLEX PO) Take 1 capsule by mouth daily.    Calcium Carbonate-Vitamin D (CALCIUM + D PO) Take 1 capsule by mouth 2 (two) times daily.   Cholecalciferol (VITAMIN D) 2000 UNITS CAPS Take 1 capsule by mouth daily.   dorzolamide (TRUSOPT) 2 % ophthalmic solution Place 1 drop into the left eye 2 (two) times daily.   doxazosin (CARDURA) 4 MG tablet TAKE 1/2 TABLET (2 MG) BY MOUTH DAILY   ferrous sulfate 325 (65 FE) MG EC tablet Take 1 tablet (325 mg total) by mouth 2 (two) times daily.   fish oil-omega-3 fatty acids 1000 MG capsule Take 2 g by mouth 2 (two) times daily.   hydrochlorothiazide (HYDRODIURIL) 25 MG tablet Take 1 tablet (25 mg total) by mouth daily.   latanoprost (XALATAN) 0.005 % ophthalmic solution Place 1 drop into the left eye at bedtime.    lisinopril (ZESTRIL) 40 MG tablet Take 40 mg by mouth daily.   melatonin 5 MG TABS Take 5 mg by mouth at bedtime.   Multiple Vitamin (MULTIVITAMIN) tablet Take 1 tablet by mouth daily.   pantoprazole (PROTONIX) 40 MG  tablet Take 40 mg by mouth daily.   potassium chloride (KLOR-CON) 10 MEQ tablet TAKE 1 TABLET BY MOUTH  DAILY   pravastatin (PRAVACHOL) 40 MG tablet Take 1 tablet (40 mg total) by mouth daily.   SYMBICORT 160-4.5 MCG/ACT inhaler USE 2 INHALATIONS BY MOUTH  TWICE DAILY   traZODone (DESYREL) 50 MG tablet Take 1 tablet (50 mg total) by mouth at bedtime.   Turmeric 500 MG CAPS Take 500 mg by mouth daily.    venlafaxine XR (EFFEXOR-XR) 75 MG 24 hr capsule TAKE 1 CAPSULE BY MOUTH  DAILY WITH BREAKFAST   verapamil (CALAN-SR) 240 MG CR tablet TAKE 1 TABLET BY MOUTH AT  BEDTIME   vitamin E 180 MG (400 UNITS) capsule Take 180 mg by mouth daily.   No facility-administered encounter medications on file as of 12/23/2020.    Reviewed chart prior to disease state call. Spoke with patient regarding BP  Recent Office Vitals: BP Readings from Last 3 Encounters:  11/20/20 128/60  11/13/20 (!) 170/82  07/02/20 (!) 150/80   Pulse Readings from Last 3 Encounters:  11/20/20 80  11/13/20 81  07/02/20 79    Wt Readings from Last 3 Encounters:  11/20/20 242 lb (109.8 kg)  11/13/20 248 lb 11.2 oz (112.8 kg)  07/02/20 237 lb (107.5 kg)     Kidney Function Lab Results  Component Value Date/Time   CREATININE 1.31 (H) 11/13/2020 02:38 PM   CREATININE 1.20 (H) 07/02/2020 12:05 PM   GFR 77.72 12/28/2010  09:03 AM   GFRNONAA 39 (L) 11/13/2020 02:38 PM   GFRAA 45 (L) 11/13/2020 02:38 PM    BMP Latest Ref Rng & Units 11/13/2020 07/02/2020 02/21/2020  Glucose 65 - 99 mg/dL 112(H) 92 118(H)  BUN 7 - 25 mg/dL 24 15 20   Creatinine 0.60 - 0.93 mg/dL 1.31(H) 1.20(H) 1.38(H)  BUN/Creat Ratio 6 - 22 (calc) 18 13 14   Sodium 135 - 146 mmol/L 141 142 143  Potassium 3.5 - 5.3 mmol/L 4.7 4.3 4.0  Chloride 98 - 110 mmol/L 107 106 105  CO2 20 - 32 mmol/L 23 26 26   Calcium 8.6 - 10.4 mg/dL 9.9 10.5(H) 10.1    Current antihypertensive regimen:  Doxazosin 4 mg 1/2 tablet daily  Verapamil 240 mg 1 tablet at  bedtime  How often are you checking your Blood Pressure? Patient stated she does not check her blood pressure because she does not have a blood pressure machine.    Current home BP readings: Patient stated she does not check her blood pressure because she does not have a blood pressure machine.    What recent interventions/DTPs have been made by any provider to improve Blood Pressure control since last CPP Visit: None  Any recent hospitalizations or ED visits since last visit with CPP? Patient stated no.  What diet changes have been made to improve Blood Pressure Control?  Patient stated she doesn't cook but her diet has not changed much.  What exercise is being done to improve your Blood Pressure Control?  Patient stated she is not able to do much because of her back pain but she does do things around the house.  Adherence Review: Is the patient currently on ACE/ARB medication? None.  Does the patient have >5 day gap between last estimated fill dates? Per misc rpts, no.  Star Rating Drugs: Pravastatin 40 mg 90 DS 11/04/20  From speaking with the patient she is having a little difficulty with her medications and knowing what each medication is for. She is waiting for a phone call from the pharmacist in regards of her medications, she needs a full review on all of them and a understanding on what each of them are for.  Follow-Up:Pharmacist Review   Charlann Lange, RMA Clinical Pharmacist Assistant [phone]  14 minutes spent in review, coordination, and documentation.  Reviewed patient's home meds.  She was not currently taking Lisinopril 40mg  or HCTZ 25mg .  She was going to reach out to pharmacy for refill.  Will follow up with BP monitoring.  Reviewed by: Beverly Milch, PharmD Clinical Pharmacist [facility] [phone]

## 2020-12-27 DIAGNOSIS — J449 Chronic obstructive pulmonary disease, unspecified: Secondary | ICD-10-CM | POA: Diagnosis not present

## 2020-12-29 ENCOUNTER — Other Ambulatory Visit: Payer: Self-pay | Admitting: Family Medicine

## 2021-01-18 ENCOUNTER — Telehealth: Payer: Self-pay | Admitting: Pharmacist

## 2021-01-18 NOTE — Progress Notes (Addendum)
Chronic Care Management Pharmacy Assistant   Name: DIVA LEMBERGER  MRN: [id number] DOB: 03-30-1941  Reason for Encounter: Disease State For HTN.   Conditions to be addressed/monitored: HTN, Dyslipidemia, GERD, Dementia, COPD, CKD, anemia  Recent office visits:  12/13/20  Recent consult visits:  12/23/20  Hospital visits:  12/23/20  Medications: Outpatient Encounter Medications as of 01/18/2021  Medication Sig   B Complex-Biotin-FA (B-COMPLEX PO) Take 1 capsule by mouth daily.    Calcium Carbonate-Vitamin D (CALCIUM + D PO) Take 1 capsule by mouth 2 (two) times daily.   Cholecalciferol (VITAMIN D) 2000 UNITS CAPS Take 1 capsule by mouth daily.   dorzolamide (TRUSOPT) 2 % ophthalmic solution Place 1 drop into the left eye 2 (two) times daily.   doxazosin (CARDURA) 4 MG tablet TAKE 1/2 TABLET (2 MG) BY MOUTH DAILY   ferrous sulfate 325 (65 FE) MG EC tablet Take 1 tablet (325 mg total) by mouth 2 (two) times daily.   fish oil-omega-3 fatty acids 1000 MG capsule Take 2 g by mouth 2 (two) times daily.   hydrochlorothiazide (HYDRODIURIL) 25 MG tablet Take 1 tablet (25 mg total) by mouth daily.   latanoprost (XALATAN) 0.005 % ophthalmic solution Place 1 drop into the left eye at bedtime.    lisinopril (ZESTRIL) 40 MG tablet Take 40 mg by mouth daily.   melatonin 5 MG TABS Take 5 mg by mouth at bedtime.   Multiple Vitamin (MULTIVITAMIN) tablet Take 1 tablet by mouth daily.   pantoprazole (PROTONIX) 40 MG tablet Take 40 mg by mouth daily.   potassium chloride (KLOR-CON) 10 MEQ tablet TAKE 1 TABLET BY MOUTH  DAILY   pravastatin (PRAVACHOL) 40 MG tablet TAKE 1 TABLET BY MOUTH  DAILY   SYMBICORT 160-4.5 MCG/ACT inhaler USE 2 INHALATIONS BY MOUTH  TWICE DAILY   traZODone (DESYREL) 50 MG tablet Take 1 tablet (50 mg total) by mouth at bedtime.   Turmeric 500 MG CAPS Take 500 mg by mouth daily.    venlafaxine XR (EFFEXOR-XR) 75 MG 24 hr capsule TAKE 1 CAPSULE BY MOUTH  DAILY WITH BREAKFAST    verapamil (CALAN-SR) 240 MG CR tablet TAKE 1 TABLET BY MOUTH AT  BEDTIME   vitamin E 180 MG (400 UNITS) capsule Take 180 mg by mouth daily.   No facility-administered encounter medications on file as of 01/18/2021.   Care Gaps: Patient stated she is not taking Lisinopril 40 mg at this time because when she needed a refill on it she could never get it refilled so she just stopped taking it. She stated she wasn't sure about the HCTZ medication, she stated she takes a lot of medications and is not sure what they all are. I offered packing with [facility] and she agreed to try it out but she had people over and wanted to talk about it at another time. She stated she finally has a blood pressure monitor but she is not sure how to use it properly, she stated she continues to get a error message, I tired to talk her through it but I wasn't successful. She stated her nurse will be here next week and she stated she would ask the nurse to show her how to use her blood pressure machine.   Star Rating Drugs: Pravastatin 40 mg 90 DS 11/04/20 , Lisinopril 40 mg 90 DS 09/09/19 (Patient is not taking it anymore)  Follow-Up:Pharmacist Review  Charlann Lange, RMA Clinical Pharmacist Assistant [phone]  10 minutes spent in review,  coordination, and documentation.  Reviewed by: Beverly Milch, PharmD Clinical Pharmacist [facility] [phone]

## 2021-01-26 DIAGNOSIS — H401122 Primary open-angle glaucoma, left eye, moderate stage: Secondary | ICD-10-CM | POA: Diagnosis not present

## 2021-01-26 DIAGNOSIS — H401113 Primary open-angle glaucoma, right eye, severe stage: Secondary | ICD-10-CM | POA: Diagnosis not present

## 2021-01-27 ENCOUNTER — Other Ambulatory Visit: Payer: Self-pay | Admitting: Family Medicine

## 2021-01-27 DIAGNOSIS — J449 Chronic obstructive pulmonary disease, unspecified: Secondary | ICD-10-CM | POA: Diagnosis not present

## 2021-01-28 ENCOUNTER — Ambulatory Visit (INDEPENDENT_AMBULATORY_CARE_PROVIDER_SITE_OTHER): Payer: Medicare Other | Admitting: Pharmacist

## 2021-01-28 ENCOUNTER — Other Ambulatory Visit: Payer: Self-pay

## 2021-01-28 DIAGNOSIS — I10 Essential (primary) hypertension: Secondary | ICD-10-CM | POA: Diagnosis not present

## 2021-01-28 DIAGNOSIS — E785 Hyperlipidemia, unspecified: Secondary | ICD-10-CM | POA: Diagnosis not present

## 2021-01-28 MED ORDER — LISINOPRIL 40 MG PO TABS: 40.0000 mg | ORAL_TABLET | Freq: Every day | ORAL | 0 refills | Status: DC

## 2021-01-28 NOTE — Patient Instructions (Addendum)
Visit Information   Goals Addressed             This Visit's Progress    Track and Manage My Blood Pressure-Hypertension       Timeframe:  Long-Range Goal Priority:  High Start Date:   01/28/21                          Expected End Date:   07/30/21                    Follow Up Date 05/14/21    - check blood pressure 3 times per week - choose a place to take my blood pressure (home, clinic or office, retail store) - write blood pressure results in a log or diary    Why is this important?   You won't feel high blood pressure, but it can still hurt your blood vessels.  High blood pressure can cause heart or kidney problems. It can also cause a stroke.  Making lifestyle changes like losing a little weight or eating less salt will help.  Checking your blood pressure at home and at different times of the day can help to control blood pressure.  If the doctor prescribes medicine remember to take it the way the doctor ordered.  Call the office if you cannot afford the medicine or if there are questions about it.     Notes:         Patient Care Plan: General Pharmacy (Adult)     Problem Identified: HTN, Dyslipidemia, GERD, Dementia, COPD, CKD, anemia   Priority: High  Onset Date: 01/28/2021     Long-Range Goal: Patient-Specific Goal   Start Date: 01/28/2021  Expected End Date: 07/30/2021  This Visit's Progress: On track  Priority: High  Note:   Current Barriers:  Unable to achieve control of BP   Pharmacist Clinical Goal(s):  Patient will achieve adherence to monitoring guidelines and medication adherence to achieve therapeutic efficacy achieve control of BP as evidenced by home monitoring contact provider office for questions/concerns as evidenced notation of same in electronic health record through collaboration with PharmD and provider.   Interventions: 1:1 collaboration with Susy Frizzle, MD regarding development and update of comprehensive plan of care as  evidenced by provider attestation and co-signature Inter-disciplinary care team collaboration (see longitudinal plan of care) Comprehensive medication review performed; medication list updated in electronic medical record  Hypertension (BP goal <140/90) -Uncontrolled -Current treatment: Doxazosin 4mg  daily -Medications previously tried: none noted  -Current home readings: reports systolic TZ>001 -Reports hypotensive/hypertensive symptoms including light headedness -Educated on BP goals and benefits of medications for prevention of heart attack, stroke and kidney damage; Daily salt intake goal < 2300 mg; Exercise goal of 150 minutes per week; Importance of home blood pressure monitoring; Symptoms of hypotension and importance of maintaining adequate hydration; -She has not been taking Lisinopril or HCTZ for an unclear amount of time.  The pharmacy would not fill the medication per patient. -Counseled to monitor BP at home daily for two straight weeks, document, and provide log at future appointments -Counseled on diet and exercise extensively Recommended to continue current medication Collaborated with PCP and we have decided to resume Lisinopril for now.  She is going to monitor BP closely and I will assess in two weeks.  Continue to hole HCTZ for now.  Hyperlipidemia: (LDL goal < 100) -Controlled -Current treatment: None -Medications previously tried: pravastatin  -Educated on Cholesterol  goals;  Importance of limiting foods high in cholesterol; Exercise goal of 150 minutes per week; -Recommend updated lipid panel -Most recent LDL controlled -Recommended to continue current medication Recommended repeat lipid panel  Patient will:  - take medications as prescribed focus on medication adherence by pill box check blood pressure daily for two weeks, document, and provide at future appointments  Follow Up Plan: The care management team will reach out to the patient again over the  next 30 days.        The patient verbalized understanding of instructions, educational materials, and care plan provided today and agreed to receive a mailed copy of patient instructions, educational materials, and care plan.  Telephone follow up appointment with pharmacy team member scheduled for: 2 months  Edythe Clarity, [facility]

## 2021-01-28 NOTE — Progress Notes (Signed)
Chronic Care Management Pharmacy Note  01/28/2021 Name:  Virginia Walters MRN:  [id number] DOB:  06-04-1941  Summary: Patient in for medication review mainly for HTN medications.  She is only taking doxazosin.  Reports systolic BP in the 573U and higher.  Previously was on lisinopril and HCTZ and these were stopped for an unknown reason some time ago.  Recommendations/Changes made from today's visit: Rx sent in for lisinopril, she is to take this and monitor BP closely for two weeks.  Will assess at that time.  Plan: CMA follow up in two weeks on BP   Subjective: Virginia Walters is an 80 y.o. year old female who is a primary patient of Pickard, Cammie Mcgee, MD.  The CCM team was consulted for assistance with disease management and care coordination needs.    Engaged with patient face to face for follow up visit in response to provider referral for pharmacy case management and/or care coordination services.   Consent to Services:  The patient was given the following information about Chronic Care Management services today, agreed to services, and gave verbal consent: 1. CCM service includes personalized support from designated clinical staff supervised by the primary care provider, including individualized plan of care and coordination with other care providers 2. 24/7 contact phone numbers for assistance for urgent and routine care needs. 3. Service will only be billed when office clinical staff spend 20 minutes or more in a month to coordinate care. 4. Only one practitioner may furnish and bill the service in a calendar month. 5.The patient may stop CCM services at any time (effective at the end of the month) by phone call to the office staff. 6. The patient will be responsible for cost sharing (co-pay) of up to 20% of the service fee (after annual deductible is met). Patient agreed to services and consent obtained.  Patient Care Team: Susy Frizzle, MD as PCP - General (Family  Medicine) Alisa Graff, FNP as Nurse Practitioner (Family Medicine) Theora Gianotti, NP as Nurse Practitioner (Cardiology) Edythe Clarity, [facility] as Pharmacist (Pharmacist)  Recent office visits: None since last CCM call  Recent consult visits: None since last CCM call  Hospital visits: None in previous 6 months   Objective:  Lab Results  Component Value Date   CREATININE 1.31 (H) 11/13/2020   BUN 24 11/13/2020   GFR 77.72 12/28/2010   GFRNONAA 39 (L) 11/13/2020   GFRAA 45 (L) 11/13/2020   NA 141 11/13/2020   K 4.7 11/13/2020   CALCIUM 9.9 11/13/2020   CO2 23 11/13/2020   GLUCOSE 112 (H) 11/13/2020    Lab Results  Component Value Date/Time   HGBA1C 4.9 10/12/2016 01:07 PM   GFR 77.72 12/28/2010 09:03 AM    Last diabetic Eye exam:  Lab Results  Component Value Date/Time   HMDIABEYEEXA No Retinopathy 04/25/2019 12:00 AM    Last diabetic Foot exam: No results found for: HMDIABFOOTEX   Lab Results  Component Value Date   CHOL 158 03/06/2017   HDL 72 03/06/2017   LDLCALC 64 03/06/2017   TRIG 111 03/06/2017   CHOLHDL 2.2 03/06/2017    Hepatic Function Latest Ref Rng & Units 11/13/2020 07/02/2020 02/07/2020  Total Protein 6.1 - 8.1 g/dL 6.7 7.1 6.7  Albumin 3.5 - 5.0 g/dL - - -  AST 10 - 35 U/L '24 18 12  ' ALT 6 - 29 U/L 30(H) 24 11  Alk Phosphatase 38 - 126 U/L - - -  Total Bilirubin 0.2 - 1.2 mg/dL 0.3 0.8 0.3  Bilirubin, Direct 0.1 - 0.5 mg/dL - - -    Lab Results  Component Value Date/Time   TSH 1.49 02/07/2020 03:57 PM   TSH 1.718 10/25/2016 04:14 PM   TSH 1.420 10/12/2016 01:07 PM    CBC Latest Ref Rng & Units 11/20/2020 11/13/2020 07/02/2020  WBC 3.8 - 10.8 Thousand/uL 7.0 7.4 7.6  Hemoglobin 11.7 - 15.5 g/dL 8.7(L) 8.4(L) 15.1  Hematocrit 35.0 - 45.0 % 29.4(L) 27.7(L) 43.8  Platelets 140 - 400 Thousand/uL 313 350 218    Lab Results  Component Value Date/Time   VD25OH 62.1 10/12/2016 01:07 PM    Clinical ASCVD: No  The ASCVD Risk  score Mikey Bussing DC Jr., et al., 2013) failed to calculate for the following reasons:   Cannot find a previous HDL lab   Cannot find a previous total cholesterol lab    Depression screen [facility] 2/9 09/26/2019 08/31/2017  Decreased Interest 3 2  Down, Depressed, Hopeless 3 1  PHQ - 2 Score 6 3  Altered sleeping 3 2  Tired, decreased energy 3 0  Change in appetite 3 2  Feeling bad or failure about yourself  3 0  Trouble concentrating 3 1  Moving slowly or fidgety/restless 0 0  Suicidal thoughts 0 0  PHQ-9 Score 21 8  Difficult doing work/chores Extremely dIfficult Not difficult at all     Social History   Tobacco Use  Smoking Status Former   Packs/day: 1.00   Years: 15.00   Pack years: 15.00   Types: Cigarettes   Quit date: 12/27/1980   Years since quitting: 40.1  Smokeless Tobacco Never   BP Readings from Last 3 Encounters:  11/20/20 128/60  11/13/20 (!) 170/82  07/02/20 (!) 150/80   Pulse Readings from Last 3 Encounters:  11/20/20 80  11/13/20 81  07/02/20 79   Wt Readings from Last 3 Encounters:  11/20/20 242 lb (109.8 kg)  11/13/20 248 lb 11.2 oz (112.8 kg)  07/02/20 237 lb (107.5 kg)   BMI Readings from Last 3 Encounters:  11/20/20 40.27 kg/m  11/13/20 41.39 kg/m  07/02/20 39.44 kg/m    Assessment/Interventions: Review of patient past medical history, allergies, medications, health status, including review of consultants reports, laboratory and other test data, was performed as part of comprehensive evaluation and provision of chronic care management services.   SDOH:  (Social Determinants of Health) assessments and interventions performed: Yes  Financial Resource Strain: Low Risk    Difficulty of Paying Living Expenses: Not very hard    SDOH Screenings   Alcohol Screen: Not on file  Depression (PHQ2-9): Not on file  Financial Resource Strain: Low Risk    Difficulty of Paying Living Expenses: Not very hard  Food Insecurity: Not on file  Housing: Not on  file  Physical Activity: Not on file  Social Connections: Not on file  Stress: Not on file  Tobacco Use: Medium Risk   Smoking Tobacco Use: Former   Smokeless Tobacco Use: Never  Transportation Needs: Not on file    [place]  Allergies  Allergen Reactions   Oxycodone Itching    Medications Reviewed Today     Reviewed by Edythe Clarity, [facility] (Pharmacist) on 01/28/21 at 1512  Med List Status: <None>   Medication Order Taking? Sig Documenting Provider Last Dose Status Informant  B Complex-Biotin-FA (B-COMPLEX PO) [id number] Yes Take 1 capsule by mouth daily.  [provider] Taking Active Other  Calcium Carbonate-Vitamin D (CALCIUM + D PO) [id number] Yes Take 1 capsule by mouth 2 (two) times daily. [provider] Taking Active Other  Cholecalciferol (VITAMIN D) 2000 UNITS CAPS [id number] Yes Take 1 capsule by mouth daily. [provider] Taking Active Other  dorzolamide (TRUSOPT) 2 % ophthalmic solution [id number] Yes Place 1 drop into the left eye 2 (two) times daily. [provider] Taking Active Pharmacy Records           Med Note Johny Drilling Feb 10, 2020  7:40 PM)    doxazosin (CARDURA) 4 MG tablet [id number] Yes TAKE 1/2 TABLET (2 MG) BY MOUTH DAILY Susy Frizzle, MD Taking Active   ferrous sulfate 325 (65 FE) MG EC tablet [id number] Yes Take 1 tablet (325 mg total) by mouth 2 (two) times daily. Susy Frizzle, MD Taking Active   fish oil-omega-3 fatty acids 1000 MG capsule [id number] Yes Take 2 g by mouth 2 (two) times daily. [provider] Taking Active Other  hydrochlorothiazide (HYDRODIURIL) 25 MG tablet [id number] No Take 1 tablet (25 mg total) by mouth daily.  Patient not taking: Reported on 01/28/2021   Susy Frizzle, MD Not Taking Active   latanoprost (XALATAN) 0.005 % ophthalmic solution [id number] Yes Place 1 drop into the left eye at bedtime.  [provider] Taking Active Pharmacy Records            Med Note Johny Drilling Feb 10, 2020  7:40 PM)    lisinopril (ZESTRIL) 40 MG tablet [id number] Yes Take 1 tablet (40 mg total) by mouth daily. Susy Frizzle, MD Taking Active   melatonin 5 MG TABS [id number] Yes Take 5 mg by mouth at bedtime. [provider] Taking Active Other  Multiple Vitamin (MULTIVITAMIN) tablet [id number] Yes Take 1 tablet by mouth daily. [provider] Taking Active Other  pantoprazole (PROTONIX) 40 MG tablet [id number] Yes Take 40 mg by mouth daily. [provider] Taking Active   potassium chloride (KLOR-CON) 10 MEQ tablet [id number] Yes TAKE 1 TABLET BY MOUTH  DAILY Susy Frizzle, MD Taking Active   pravastatin (PRAVACHOL) 40 MG tablet [id number] Yes TAKE 1 TABLET BY MOUTH  DAILY Susy Frizzle, MD Taking Active   SYMBICORT 160-4.5 MCG/ACT inhaler [id number] No USE 2 INHALATIONS BY MOUTH  TWICE DAILY  Patient not taking: Reported on 01/28/2021   Susy Frizzle, MD Not Taking Active   traZODone (DESYREL) 50 MG tablet [id number] Yes TAKE 1 TABLET BY MOUTH AT  BEDTIME Susy Frizzle, MD Taking Active   Turmeric 500 MG CAPS [id number] Yes Take 500 mg by mouth daily.  [provider] Taking Active Other  venlafaxine XR (EFFEXOR-XR) 75 MG 24 hr capsule [id number] Yes TAKE 1 CAPSULE BY MOUTH  DAILY WITH BREAKFAST Susy Frizzle, MD Taking Active   verapamil (CALAN-SR) 240 MG CR tablet [id number] Yes TAKE 1 TABLET BY MOUTH AT  BEDTIME Susy Frizzle, MD Taking Active   vitamin E 180 MG (400 UNITS) capsule [id number] Yes Take 180 mg by mouth daily. 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, stage 3a ([place]) 02/10/2020    COPD exacerbation ([place]) 00/76/2263   Chronic systolic heart failure ([place]) 08/29/2018   Acute on chronic heart failure with preserved ejection fraction (H[facility])    Symptomatic anemia 07/20/2018   Dementia with behavioral problem ([place]) 10/26/2016   Alcohol abuse 10/26/2016   Memory loss 10/12/2016   Gait abnormality 10/12/2016   Orthostatic dizziness 10/12/2016   Paresthesia 10/12/2016   Abnormal EKG 12/28/2010   HTN (hypertension)    Dyslipidemia    PFO (patent foramen ovale)    Retinal artery occlusion    GERD (gastroesophageal reflux disease)     Immunization History  Administered Date(s) Administered   Fluad Quad(high Dose 65+) 06/06/2019, 07/02/2020   Influenza, High Dose Seasonal PF 05/23/2017, 06/05/2018   Pneumococcal Polysaccharide-23 06/06/2019    Conditions to be addressed/monitored:  HTN, Dyslipidemia, GERD, Dementia, COPD, CKD, anemia  Care Plan : General Pharmacy (Adult)  Updates made by Edythe Clarity, RPH since 01/28/2021 12:00 AM     Problem: HTN, Dyslipidemia, GERD, Dementia, COPD, CKD, anemia   Priority: High  Onset Date: 01/28/2021     Long-Range Goal: Patient-Specific Goal   Start Date: 01/28/2021  Expected End Date: 07/30/2021  This Visit's Progress: On track  Priority: High  Note:   Current Barriers:  Unable to achieve control of BP   Pharmacist Clinical Goal(s):  Patient will achieve adherence to monitoring guidelines and medication adherence to achieve therapeutic efficacy achieve control of BP as evidenced by home monitoring contact provider office for questions/concerns as evidenced notation of same in electronic health record through collaboration with PharmD and provider.   Interventions: 1:1 collaboration with Susy Frizzle, MD regarding development and update of comprehensive plan of care as evidenced by provider attestation and co-signature Inter-disciplinary care team collaboration (see longitudinal plan of care) Comprehensive  medication review performed; medication list updated in electronic medical record  Hypertension (BP goal <140/90) -Uncontrolled -Current treatment: Doxazosin 28m daily -Medications previously tried: none noted  -Current home readings: reports systolic BFH>545-Reports hypotensive/hypertensive symptoms including light headedness -Educated on BP goals and benefits of medications for prevention of heart attack, stroke and kidney damage; Daily salt intake goal < 2300 mg; Exercise goal of 150 minutes per week; Importance of home blood pressure monitoring; Symptoms of hypotension and importance of maintaining adequate hydration; -She has not been taking Lisinopril or HCTZ for an unclear amount of time.  The pharmacy would not fill the medication per patient. -Counseled to monitor BP at home daily for two straight weeks, document, and provide log at future appointments -Counseled on diet and exercise extensively Recommended to continue current medication Collaborated with PCP and we have decided to resume Lisinopril for now.  She is going to monitor BP closely and I will assess in two weeks.  Continue to hole HCTZ for now.  Hyperlipidemia: (LDL goal < 100) -Controlled -Current treatment: None -Medications previously tried: pravastatin  -Educated on Cholesterol goals;  Importance of limiting foods high in cholesterol; Exercise goal of 150 minutes per week; -Recommend updated lipid panel -Most recent LDL controlled -Recommended to continue current medication Recommended repeat lipid panel  Patient will:  - take medications as prescribed focus on medication adherence by pill box check blood pressure daily for two weeks, document, and provide at future appointments  Follow Up Plan: The care management team will reach out to the patient again over the next 30 days.        Medication  Assistance: None required.  Patient affirms current coverage meets  needs.  Compliance/Adherence/Medication fill history: Care Gaps: Shingrix  Patient's preferred pharmacy is:  [facility], [address] [address] Phone: [phone] Fax: [phone]  [facility]  ([place], [address], [address] [place], [address] [place] [address] Phone: [phone] Fax: [phone]  Uses pill box? Yes Pt endorses 100% compliance  We discussed: Benefits of medication synchronization, packaging and delivery as well as enhanced pharmacist oversight with [facility]. Patient decided to: Continue current medication management strategy  Care Plan and Follow Up Patient Decision:  Patient agrees to Care Plan and Follow-up.  Plan: The care management team will reach out to the patient again over the next 30 days.  Beverly Milch, PharmD Clinical Pharmacist [place] [phone]

## 2021-02-10 ENCOUNTER — Telehealth: Payer: Self-pay | Admitting: Pharmacist

## 2021-02-10 NOTE — Progress Notes (Addendum)
    Chronic Care Management Pharmacy Assistant   Name: Virginia Walters  MRN: [id number] DOB: 04/05/1941  Reason for Encounter: Adherence Review  Reviewed the patients chart for any medication/health and/or medication changes there were not any changes at this time.    Follow-Up:Pharmacist Review   Charlann Lange, [place] Pharmacist Assistant [phone]

## 2021-02-18 ENCOUNTER — Telehealth: Payer: Self-pay | Admitting: Pharmacist

## 2021-02-18 NOTE — Progress Notes (Addendum)
Chronic Care Management Pharmacy Assistant   Name: Virginia Walters  MRN: [id number] DOB: 31-Dec-1940  Reason for Encounter: Disease State For HTN.   Conditions to be addressed/monitored: HTN, Dyslipidemia, GERD, Dementia, COPD, CKD, anemia  Recent office visits:  None since 02/10/21  Recent consult visits:  None since 02/10/21  Hospital visits:  None since 02/10/21  Medications: Outpatient Encounter Medications as of 02/18/2021  Medication Sig   B Complex-Biotin-FA (B-COMPLEX PO) Take 1 capsule by mouth daily.    Calcium Carbonate-Vitamin D (CALCIUM + D PO) Take 1 capsule by mouth 2 (two) times daily.   Cholecalciferol (VITAMIN D) 2000 UNITS CAPS Take 1 capsule by mouth daily.   dorzolamide (TRUSOPT) 2 % ophthalmic solution Place 1 drop into the left eye 2 (two) times daily.   doxazosin (CARDURA) 4 MG tablet TAKE 1/2 TABLET (2 MG) BY MOUTH DAILY   ferrous sulfate 325 (65 FE) MG EC tablet Take 1 tablet (325 mg total) by mouth 2 (two) times daily.   fish oil-omega-3 fatty acids 1000 MG capsule Take 2 g by mouth 2 (two) times daily.   hydrochlorothiazide (HYDRODIURIL) 25 MG tablet Take 1 tablet (25 mg total) by mouth daily. (Patient not taking: Reported on 01/28/2021)   latanoprost (XALATAN) 0.005 % ophthalmic solution Place 1 drop into the left eye at bedtime.    lisinopril (ZESTRIL) 40 MG tablet Take 1 tablet (40 mg total) by mouth daily.   melatonin 5 MG TABS Take 5 mg by mouth at bedtime.   Multiple Vitamin (MULTIVITAMIN) tablet Take 1 tablet by mouth daily.   pantoprazole (PROTONIX) 40 MG tablet Take 40 mg by mouth daily.   potassium chloride (KLOR-CON) 10 MEQ tablet TAKE 1 TABLET BY MOUTH  DAILY   pravastatin (PRAVACHOL) 40 MG tablet TAKE 1 TABLET BY MOUTH  DAILY   SYMBICORT 160-4.5 MCG/ACT inhaler USE 2 INHALATIONS BY MOUTH  TWICE DAILY (Patient not taking: Reported on 01/28/2021)   traZODone (DESYREL) 50 MG tablet TAKE 1 TABLET BY MOUTH AT  BEDTIME   Turmeric 500 MG CAPS  Take 500 mg by mouth daily.    venlafaxine XR (EFFEXOR-XR) 75 MG 24 hr capsule TAKE 1 CAPSULE BY MOUTH  DAILY WITH BREAKFAST   verapamil (CALAN-SR) 240 MG CR tablet TAKE 1 TABLET BY MOUTH AT  BEDTIME   vitamin E 180 MG (400 UNITS) capsule Take 180 mg by mouth daily.   No facility-administered encounter medications on file as of 02/18/2021.    Reviewed chart prior to disease state call. Spoke with patient regarding BP  Recent Office Vitals: BP Readings from Last 3 Encounters:  11/20/20 128/60  11/13/20 (!) 170/82  07/02/20 (!) 150/80   Pulse Readings from Last 3 Encounters:  11/20/20 80  11/13/20 81  07/02/20 79    Wt Readings from Last 3 Encounters:  11/20/20 242 lb (109.8 kg)  11/13/20 248 lb 11.2 oz (112.8 kg)  07/02/20 237 lb (107.5 kg)     Kidney Function Lab Results  Component Value Date/Time   CREATININE 1.31 (H) 11/13/2020 02:38 PM   CREATININE 1.20 (H) 07/02/2020 12:05 PM   GFR 77.72 12/28/2010 09:03 AM   GFRNONAA 39 (L) 11/13/2020 02:38 PM   GFRAA 45 (L) 11/13/2020 02:38 PM    BMP Latest Ref Rng & Units 11/13/2020 07/02/2020 02/21/2020  Glucose 65 - 99 mg/dL 112(H) 92 118(H)  BUN 7 - 25 mg/dL 24 15 20   Creatinine 0.60 - 0.93 mg/dL 1.31(H) 1.20(H) 1.38(H)  BUN/Creat Ratio  6 - 22 (calc) 18 13 14   Sodium 135 - 146 mmol/L 141 142 143  Potassium 3.5 - 5.3 mmol/L 4.7 4.3 4.0  Chloride 98 - 110 mmol/L 107 106 105  CO2 20 - 32 mmol/L 23 26 26   Calcium 8.6 - 10.4 mg/dL 9.9 10.5(H) 10.1    Current antihypertensive regimen:  Doxazosin 4mg  daily  What recent interventions/DTPs have been made by any provider to improve Blood Pressure control since last CPP Visit: None.  Any recent hospitalizations or ED visits since last visit with CPP? Patients chart did not show any.  Adherence Review: Is the patient currently on ACE/ARB medication? Lisinopril 40 mg   Does the patient have >5 day gap between last estimated fill dates? Per misc rpts, no.  Star Rating Drugs:  Lisinopril 40 mg 90 DS 01/28/21 Pravastatin 40 mg 90 DS 01/28/21.  Third unsuccessful telephone outreach was attempted today. The patient was referred to the pharmacist for assistance with care management and care coordination.   Follow-Up:Pharmacist Review   Charlann Lange, [place] Pharmacist Assistant [phone]

## 2021-02-26 DIAGNOSIS — J449 Chronic obstructive pulmonary disease, unspecified: Secondary | ICD-10-CM | POA: Diagnosis not present

## 2021-03-11 ENCOUNTER — Telehealth: Payer: Self-pay | Admitting: Pharmacist

## 2021-03-11 NOTE — Progress Notes (Addendum)
    Chronic Care Management Pharmacy Assistant   Name: Virginia Walters  MRN: [id number] DOB: [birth year]  Reason for Encounter: Adherence Review  Medications: Outpatient Encounter Medications as of 03/11/2021  Medication Sig   B Complex-Biotin-FA (B-COMPLEX PO) Take 1 capsule by mouth daily.    Calcium Carbonate-Vitamin D (CALCIUM + D PO) Take 1 capsule by mouth 2 (two) times daily.   Cholecalciferol (VITAMIN D) 2000 UNITS CAPS Take 1 capsule by mouth daily.   dorzolamide (TRUSOPT) 2 % ophthalmic solution Place 1 drop into the left eye 2 (two) times daily.   doxazosin (CARDURA) 4 MG tablet TAKE 1/2 TABLET (2 MG) BY MOUTH DAILY   ferrous sulfate 325 (65 FE) MG EC tablet Take 1 tablet (325 mg total) by mouth 2 (two) times daily.   fish oil-omega-3 fatty acids 1000 MG capsule Take 2 g by mouth 2 (two) times daily.   hydrochlorothiazide (HYDRODIURIL) 25 MG tablet Take 1 tablet (25 mg total) by mouth daily. (Patient not taking: Reported on 01/28/2021)   latanoprost (XALATAN) 0.005 % ophthalmic solution Place 1 drop into the left eye at bedtime.    lisinopril (ZESTRIL) 40 MG tablet Take 1 tablet (40 mg total) by mouth daily.   melatonin 5 MG TABS Take 5 mg by mouth at bedtime.   Multiple Vitamin (MULTIVITAMIN) tablet Take 1 tablet by mouth daily.   pantoprazole (PROTONIX) 40 MG tablet Take 40 mg by mouth daily.   potassium chloride (KLOR-CON) 10 MEQ tablet TAKE 1 TABLET BY MOUTH  DAILY   pravastatin (PRAVACHOL) 40 MG tablet TAKE 1 TABLET BY MOUTH  DAILY   SYMBICORT 160-4.5 MCG/ACT inhaler USE 2 INHALATIONS BY MOUTH  TWICE DAILY (Patient not taking: Reported on 01/28/2021)   traZODone (DESYREL) 50 MG tablet TAKE 1 TABLET BY MOUTH AT  BEDTIME   Turmeric 500 MG CAPS Take 500 mg by mouth daily.    venlafaxine XR (EFFEXOR-XR) 75 MG 24 hr capsule TAKE 1 CAPSULE BY MOUTH  DAILY WITH BREAKFAST   verapamil (CALAN-SR) 240 MG CR tablet TAKE 1 TABLET BY MOUTH AT  BEDTIME   vitamin E 180 MG (400 UNITS)  capsule Take 180 mg by mouth daily.   No facility-administered encounter medications on file as of 03/11/2021.   Reviewed the patients chart for any medical/health and/or medication changes there were not any at this time.  Follow-Up:Pharmacist Review  Charlann Lange, [place] Pharmacist Assistant [phone]

## 2021-03-29 DIAGNOSIS — J449 Chronic obstructive pulmonary disease, unspecified: Secondary | ICD-10-CM | POA: Diagnosis not present

## 2021-04-01 ENCOUNTER — Ambulatory Visit (INDEPENDENT_AMBULATORY_CARE_PROVIDER_SITE_OTHER): Payer: Medicare Other | Admitting: Pharmacist

## 2021-04-01 DIAGNOSIS — E785 Hyperlipidemia, unspecified: Secondary | ICD-10-CM

## 2021-04-01 DIAGNOSIS — I10 Essential (primary) hypertension: Secondary | ICD-10-CM

## 2021-04-01 NOTE — Progress Notes (Signed)
Chronic Care Management Pharmacy Note  04/01/2021 Name:  Virginia Walters MRN:  [id number] DOB:  11/14/1940  Summary: Patient not checking BP at home. Confirmed she is taking correct BP meds now  Recommendations/Changes made from today's visit: Have asked her to check it at least once weekls.  Plan: CMA follow up in two weeks on BP   Subjective: Virginia Walters is an 80 y.o. year old female who is a primary patient of Pickard, Cammie Mcgee, MD.  The CCM team was consulted for assistance with disease management and care coordination needs.    Engaged with patient by telephone for follow up visit in response to provider referral for pharmacy case management and/or care coordination services.   Consent to Services:  The patient was given the following information about Chronic Care Management services today, agreed to services, and gave verbal consent: 1. CCM service includes personalized support from designated clinical staff supervised by the primary care provider, including individualized plan of care and coordination with other care providers 2. 24/7 contact phone numbers for assistance for urgent and routine care needs. 3. Service will only be billed when office clinical staff spend 20 minutes or more in a month to coordinate care. 4. Only one practitioner may furnish and bill the service in a calendar month. 5.The patient may stop CCM services at any time (effective at the end of the month) by phone call to the office staff. 6. The patient will be responsible for cost sharing (co-pay) of up to 20% of the service fee (after annual deductible is met). Patient agreed to services and consent obtained.  Patient Care Team: Susy Frizzle, MD as PCP - General (Family Medicine) Alisa Graff, FNP as Nurse Practitioner (Family Medicine) Theora Gianotti, NP as Nurse Practitioner (Cardiology) Edythe Clarity, [facility] as Pharmacist (Pharmacist)  Recent office visits: None since  last CCM call  Recent consult visits: None since last CCM call  Hospital visits: None in previous 6 months   Objective:  Lab Results  Component Value Date   CREATININE 1.31 (H) 11/13/2020   BUN 24 11/13/2020   GFR 77.72 12/28/2010   GFRNONAA 39 (L) 11/13/2020   GFRAA 45 (L) 11/13/2020   NA 141 11/13/2020   K 4.7 11/13/2020   CALCIUM 9.9 11/13/2020   CO2 23 11/13/2020   GLUCOSE 112 (H) 11/13/2020    Lab Results  Component Value Date/Time   HGBA1C 4.9 10/12/2016 01:07 PM   GFR 77.72 12/28/2010 09:03 AM    Last diabetic Eye exam:  Lab Results  Component Value Date/Time   HMDIABEYEEXA No Retinopathy 04/25/2019 12:00 AM    Last diabetic Foot exam: No results found for: HMDIABFOOTEX   Lab Results  Component Value Date   CHOL 158 03/06/2017   HDL 72 03/06/2017   LDLCALC 64 03/06/2017   TRIG 111 03/06/2017   CHOLHDL 2.2 03/06/2017    Hepatic Function Latest Ref Rng & Units 11/13/2020 07/02/2020 02/07/2020  Total Protein 6.1 - 8.1 g/dL 6.7 7.1 6.7  Albumin 3.5 - 5.0 g/dL - - -  AST 10 - 35 U/L '24 18 12  ' ALT 6 - 29 U/L 30(H) 24 11  Alk Phosphatase 38 - 126 U/L - - -  Total Bilirubin 0.2 - 1.2 mg/dL 0.3 0.8 0.3  Bilirubin, Direct 0.1 - 0.5 mg/dL - - -    Lab Results  Component Value Date/Time   TSH 1.49 02/07/2020 03:57 PM   TSH 1.718 10/25/2016 04:14 PM  TSH 1.420 10/12/2016 01:07 PM    CBC Latest Ref Rng & Units 11/20/2020 11/13/2020 07/02/2020  WBC 3.8 - 10.8 Thousand/uL 7.0 7.4 7.6  Hemoglobin 11.7 - 15.5 g/dL 8.7(L) 8.4(L) 15.1  Hematocrit 35.0 - 45.0 % 29.4(L) 27.7(L) 43.8  Platelets 140 - 400 Thousand/uL 313 350 218    Lab Results  Component Value Date/Time   VD25OH 62.1 10/12/2016 01:07 PM    Clinical ASCVD: No  The ASCVD Risk score Mikey Bussing DC Jr., et al., 2013) failed to calculate for the following reasons:   Cannot find a previous HDL lab   Cannot find a previous total cholesterol lab    Depression screen [facility] 2/9 09/26/2019 08/31/2017  Decreased  Interest 3 2  Down, Depressed, Hopeless 3 1  PHQ - 2 Score 6 3  Altered sleeping 3 2  Tired, decreased energy 3 0  Change in appetite 3 2  Feeling bad or failure about yourself  3 0  Trouble concentrating 3 1  Moving slowly or fidgety/restless 0 0  Suicidal thoughts 0 0  PHQ-9 Score 21 8  Difficult doing work/chores Extremely dIfficult Not difficult at all     Social History   Tobacco Use  Smoking Status Former   Packs/day: 1.00   Years: 15.00   Pack years: 15.00   Types: Cigarettes   Quit date: 12/27/1980   Years since quitting: 40.2  Smokeless Tobacco Never   BP Readings from Last 3 Encounters:  11/20/20 128/60  11/13/20 (!) 170/82  07/02/20 (!) 150/80   Pulse Readings from Last 3 Encounters:  11/20/20 80  11/13/20 81  07/02/20 79   Wt Readings from Last 3 Encounters:  11/20/20 242 lb (109.8 kg)  11/13/20 248 lb 11.2 oz (112.8 kg)  07/02/20 237 lb (107.5 kg)   BMI Readings from Last 3 Encounters:  11/20/20 40.27 kg/m  11/13/20 41.39 kg/m  07/02/20 39.44 kg/m    Assessment/Interventions: Review of patient past medical history, allergies, medications, health status, including review of consultants reports, laboratory and other test data, was performed as part of comprehensive evaluation and provision of chronic care management services.   SDOH:  (Social Determinants of Health) assessments and interventions performed: Yes  Financial Resource Strain: Low Risk    Difficulty of Paying Living Expenses: Not very hard    SDOH Screenings   Alcohol Screen: Not on file  Depression (PHQ2-9): Not on file  Financial Resource Strain: Low Risk    Difficulty of Paying Living Expenses: Not very hard  Food Insecurity: Not on file  Housing: Not on file  Physical Activity: Not on file  Social Connections: Not on file  Stress: Not on file  Tobacco Use: Medium Risk   Smoking Tobacco Use: Former   Smokeless Tobacco Use: Never  Transportation Needs: Not on file     [place]  Allergies  Allergen Reactions   Oxycodone Itching    Medications Reviewed Today     Reviewed by Edythe Clarity, [facility] (Pharmacist) on 04/01/21 at [place] List Status: <None>   Medication Order Taking? Sig Documenting Provider Last Dose Status Informant  B Complex-Biotin-FA (B-COMPLEX PO) [id number] Yes Take 1 capsule by mouth daily.  [provider] Taking Active Other  Calcium Carbonate-Vitamin D (CALCIUM + D PO) [id number] Yes Take 1 capsule by mouth 2 (two) times daily. [provider] Taking Active Other  Cholecalciferol (VITAMIN D) 2000 UNITS CAPS [id number] Yes Take 1 capsule by mouth daily. [provider] Taking Active  Other  dorzolamide (TRUSOPT) 2 % ophthalmic solution [id number] Yes Place 1 drop into the left eye 2 (two) times daily. [provider] Taking Active Pharmacy Records           Med Note Johny Drilling Feb 10, 2020  7:40 PM)    doxazosin (CARDURA) 4 MG tablet [id number] Yes TAKE 1/2 TABLET (2 MG) BY MOUTH DAILY Susy Frizzle, MD Taking Active   ferrous sulfate 325 (65 FE) MG EC tablet [id number]  Take 1 tablet (325 mg total) by mouth 2 (two) times daily. Susy Frizzle, MD  Expired 02/20/21 2359   fish oil-omega-3 fatty acids 1000 MG capsule [id number] Yes Take 2 g by mouth 2 (two) times daily. [provider] Taking Active Other  hydrochlorothiazide (HYDRODIURIL) 25 MG tablet [id number] Yes Take 1 tablet (25 mg total) by mouth daily. Susy Frizzle, MD Taking Active   latanoprost (XALATAN) 0.005 % ophthalmic solution [id number] Yes Place 1 drop into the left eye at bedtime.  [provider] Taking Active Pharmacy Records           Med Note Johny Drilling Feb 10, 2020  7:40 PM)    lisinopril (ZESTRIL) 40 MG tablet [id number] Yes Take 1 tablet (40 mg total) by mouth daily. Susy Frizzle, MD Taking Active   melatonin 5 MG TABS [id number] Yes Take 5 mg by mouth at bedtime. [provider] Taking Active Other  Multiple Vitamin (MULTIVITAMIN) tablet [id number] Yes Take 1 tablet by mouth daily. [provider] Taking Active Other  pantoprazole (PROTONIX) 40 MG tablet [id number] Yes Take 40 mg by mouth daily. [provider] Taking Active   potassium chloride (KLOR-CON) 10 MEQ tablet [id number] Yes TAKE 1 TABLET BY MOUTH  DAILY Susy Frizzle, MD Taking Active   pravastatin (PRAVACHOL) 40 MG tablet [id number] Yes TAKE 1 TABLET BY MOUTH  DAILY Susy Frizzle, MD Taking Active   SYMBICORT 160-4.5 MCG/ACT inhaler [id number] Yes USE 2 INHALATIONS BY MOUTH  TWICE DAILY Susy Frizzle, MD Taking Active   traZODone (DESYREL) 50 MG tablet [id number] Yes TAKE 1 TABLET BY MOUTH AT  BEDTIME Susy Frizzle, MD Taking Active   Turmeric 500 MG CAPS [id number] Yes Take 500 mg by mouth daily.  [provider] Taking Active Other  venlafaxine XR (EFFEXOR-XR) 75 MG 24 hr capsule [id number] Yes TAKE 1 CAPSULE BY MOUTH  DAILY WITH BREAKFAST Susy Frizzle, MD Taking Active   verapamil (CALAN-SR) 240 MG CR tablet [id number] Yes TAKE 1 TABLET BY MOUTH AT  BEDTIME Susy Frizzle, MD Taking Active   vitamin E 180 MG (400 UNITS) capsule [id number] Yes Take 180 mg by mouth daily. 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, stage 3a ([place]) 02/10/2020   COPD exacerbation ([place]) 83/29/1916   Chronic systolic heart failure ([place]) 08/29/2018   Acute on chronic heart failure with preserved ejection fraction ([place])    Symptomatic anemia 07/20/2018   Dementia with behavioral problem ([place]) 10/26/2016   Alcohol abuse 10/26/2016   Memory loss 10/12/2016  Gait  abnormality 10/12/2016   Orthostatic dizziness 10/12/2016   Paresthesia 10/12/2016   Abnormal EKG 12/28/2010   HTN (hypertension)    Dyslipidemia    PFO (patent foramen ovale)    Retinal artery occlusion    GERD (gastroesophageal reflux disease)     Immunization History  Administered Date(s) Administered   Fluad Quad(high Dose 65+) 06/06/2019, 07/02/2020   Influenza, High Dose Seasonal PF 05/23/2017, 06/05/2018   Pneumococcal Polysaccharide-23 06/06/2019    Conditions to be addressed/monitored:  HTN, Dyslipidemia, GERD, Dementia, COPD, CKD, anemia  Care Plan : General Pharmacy (Adult)  Updates made by Edythe Clarity, RPH since 04/01/2021 12:00 AM     Problem: HTN, Dyslipidemia, GERD, Dementia, COPD, CKD, anemia   Priority: High  Onset Date: 01/28/2021     Long-Range Goal: Patient-Specific Goal   Start Date: 01/28/2021  Expected End Date: 07/30/2021  Recent Progress: On track  Priority: High  Note:   Current Barriers:  Unable to achieve control of BP - no home monitoring to check efficacy of medications.  Pharmacist Clinical Goal(s):  Patient will achieve adherence to monitoring guidelines and medication adherence to achieve therapeutic efficacy achieve control of BP as evidenced by home monitoring contact provider office for questions/concerns as evidenced notation of same in electronic health record through collaboration with PharmD and provider.   Interventions: 1:1 collaboration with Susy Frizzle, MD regarding development and update of comprehensive plan of care as evidenced by provider attestation and co-signature Inter-disciplinary care team collaboration (see longitudinal plan of care) Comprehensive medication review performed; medication list updated in electronic medical record  Hypertension (BP goal <140/90) -Uncontrolled -Current treatment: Doxazosin 60m daily Lisinopril 472mdaily -Medications previously tried: none noted  -Current home  readings: reports systolic BPHQ>[id number] hypotensive/hypertensive symptoms including light headedness -Educated on BP goals and benefits of medications for prevention of heart attack, stroke and kidney damage; Daily salt intake goal < 2300 mg; Exercise goal of 150 minutes per week; Importance of home blood pressure monitoring; Symptoms of hypotension and importance of maintaining adequate hydration; -She has not been taking Lisinopril or HCTZ for an unclear amount of time.  The pharmacy would not fill the medication per patient. -Counseled to monitor BP at home daily for two straight weeks, document, and provide log at future appointments -Counseled on diet and exercise extensively Recommended to continue current medication Collaborated with PCP and we have decided to resume Lisinopril for now.  She is going to monitor BP closely and I will assess in two weeks.  Continue to hole HCTZ for now.  Update 04/01/21 Called to get update on patient's BP readings.  She has not been checking at home.  Did confirm she was taking the correct medications.  She denies any symptoms at home of hypo/hypertension.  Reinforced importance of home monitoring.  Have asked her to monitor it at least once per week and write them down for me.  Will have check in in two weeks to see if she has checked her BP at home.  Continue current meds for now.  Hyperlipidemia: (LDL goal < 100) -Controlled -Current treatment: None -Medications previously tried: pravastatin  -Educated on Cholesterol goals;  Importance of limiting foods high in cholesterol; Exercise goal of 150 minutes per week; -Recommend updated lipid panel -Most recent LDL controlled -Recommended to continue current medication Recommended repeat lipid panel  Update 04/01/21 Patient needs update lipid panel.  Recommended fu visit with PCP so we can get updated labs.  Patient will:  -  take medications as prescribed focus on medication adherence by  pill box check blood pressure daily for two weeks, document, and provide at future appointments  Follow Up Plan: The care management team will reach out to the patient again over the next 30 days.        Medication Assistance: None required.  Patient affirms current coverage meets needs.  Compliance/Adherence/Medication fill history: Care Gaps: Shingrix  Patient's preferred pharmacy is:  [facility], [address] [place] [address] Phone: [phone] Fax: [phone]  [facility]  ([place] - [address], [address] [place] [address] Phone: [phone] Fax: [phone]  Uses pill box? Yes Pt endorses 100% compliance  We discussed: Benefits of medication synchronization, packaging and delivery as well as enhanced pharmacist oversight with [employer]. Patient decided to: Continue current medication management strategy  Care Plan and Follow Up Patient Decision:  Patient agrees to Care Plan and Follow-up.  Plan: The care management team will reach out to the patient again over the next 30 days.  Beverly Milch, PharmD Clinical Pharmacist [place] [phone]

## 2021-04-01 NOTE — Patient Instructions (Addendum)
Visit Information   Goals Addressed             This Visit's Progress    Track and Manage My Blood Pressure-Hypertension   Not on track    Timeframe:  Long-Range Goal Priority:  High Start Date:   01/28/21                          Expected End Date:   07/30/21                    Follow Up Date 05/14/21    - check blood pressure 3 times per week - choose a place to take my blood pressure (home, clinic or office, retail store) - write blood pressure results in a log or diary    Why is this important?   You won't feel high blood pressure, but it can still hurt your blood vessels.  High blood pressure can cause heart or kidney problems. It can also cause a stroke.  Making lifestyle changes like losing a little weight or eating less salt will help.  Checking your blood pressure at home and at different times of the day can help to control blood pressure.  If the doctor prescribes medicine remember to take it the way the doctor ordered.  Call the office if you cannot afford the medicine or if there are questions about it.     Notes:        Patient Care Plan: General Pharmacy (Adult)     Problem Identified: HTN, Dyslipidemia, GERD, Dementia, COPD, CKD, anemia   Priority: High  Onset Date: 01/28/2021     Long-Range Goal: Patient-Specific Goal   Start Date: 01/28/2021  Expected End Date: 07/30/2021  Recent Progress: On track  Priority: High  Note:   Current Barriers:  Unable to achieve control of BP - no home monitoring to check efficacy of medications.  Pharmacist Clinical Goal(s):  Patient will achieve adherence to monitoring guidelines and medication adherence to achieve therapeutic efficacy achieve control of BP as evidenced by home monitoring contact provider office for questions/concerns as evidenced notation of same in electronic health record through collaboration with PharmD and provider.   Interventions: 1:1 collaboration with Susy Frizzle, MD regarding  development and update of comprehensive plan of care as evidenced by provider attestation and co-signature Inter-disciplinary care team collaboration (see longitudinal plan of care) Comprehensive medication review performed; medication list updated in electronic medical record  Hypertension (BP goal <140/90) -Uncontrolled -Current treatment: Doxazosin '4mg'$  daily Lisinopril '40mg'$  daily -Medications previously tried: none noted  -Current home readings: reports systolic [id number] -Reports hypotensive/hypertensive symptoms including light headedness -Educated on BP goals and benefits of medications for prevention of heart attack, stroke and kidney damage; Daily salt intake goal < 2300 mg; Exercise goal of 150 minutes per week; Importance of home blood pressure monitoring; Symptoms of hypotension and importance of maintaining adequate hydration; -She has not been taking Lisinopril or HCTZ for an unclear amount of time.  The pharmacy would not fill the medication per patient. -Counseled to monitor BP at home daily for two straight weeks, document, and provide log at future appointments -Counseled on diet and exercise extensively Recommended to continue current medication Collaborated with PCP and we have decided to resume Lisinopril for now.  She is going to monitor BP closely and I will assess in two weeks.  Continue to hole HCTZ for now.  Update 04/01/21 Called to get update  on patient's BP readings.  She has not been checking at home.  Did confirm she was taking the correct medications.  She denies any symptoms at home of hypo/hypertension.  Reinforced importance of home monitoring.  Have asked her to monitor it at least once per week and write them down for me.  Will have check in in two weeks to see if she has checked her BP at home.  Continue current meds for now.  Hyperlipidemia: (LDL goal < 100) -Controlled -Current treatment: None -Medications previously tried: pravastatin  -Educated  on Cholesterol goals;  Importance of limiting foods high in cholesterol; Exercise goal of 150 minutes per week; -Recommend updated lipid panel -Most recent LDL controlled -Recommended to continue current medication Recommended repeat lipid panel  Update 04/01/21 Patient needs update lipid panel.  Recommended fu visit with PCP so we can get updated labs.  Patient will:  - take medications as prescribed focus on medication adherence by pill box check blood pressure daily for two weeks, document, and provide at future appointments  Follow Up Plan: The care management team will reach out to the patient again over the next 30 days.       Patient verbalizes understanding of instructions provided today and agrees to view in [place].  Telephone follow up appointment with pharmacy team member scheduled for: 4 months  Edythe Clarity, [place]

## 2021-04-09 ENCOUNTER — Other Ambulatory Visit: Payer: Self-pay | Admitting: Family Medicine

## 2021-04-12 ENCOUNTER — Other Ambulatory Visit: Payer: Self-pay | Admitting: *Deleted

## 2021-04-12 DIAGNOSIS — I10 Essential (primary) hypertension: Secondary | ICD-10-CM

## 2021-04-12 MED ORDER — DOXAZOSIN MESYLATE 4 MG PO TABS: ORAL_TABLET | ORAL | 0 refills | Status: DC

## 2021-04-12 MED ORDER — LISINOPRIL 40 MG PO TABS: 40.0000 mg | ORAL_TABLET | Freq: Every day | ORAL | 0 refills | Status: DC

## 2021-04-15 ENCOUNTER — Telehealth: Payer: Self-pay | Admitting: Pharmacist

## 2021-04-15 NOTE — Progress Notes (Addendum)
Chronic Care Management Pharmacy Assistant   Name: Virginia Walters  MRN: [id number] DOB: 04-Nov-1940  Reason for Encounter: Disease State For HTN.    Conditions to be addressed/monitored: HTN, Dyslipidemia, GERD, Dementia, COPD, CKD, anemia  Recent office visits:  None since 04/01/21  Recent consult visits:  None sine 04/01/21  Hospital visits:  None since 04/01/21  Medications: Outpatient Encounter Medications as of 04/15/2021  Medication Sig   B Complex-Biotin-FA (B-COMPLEX PO) Take 1 capsule by mouth daily.    Calcium Carbonate-Vitamin D (CALCIUM + D PO) Take 1 capsule by mouth 2 (two) times daily.   Cholecalciferol (VITAMIN D) 2000 UNITS CAPS Take 1 capsule by mouth daily.   dorzolamide (TRUSOPT) 2 % ophthalmic solution Place 1 drop into the left eye 2 (two) times daily.   doxazosin (CARDURA) 4 MG tablet TAKE 1/2 TABLET (2 MG) BY MOUTH DAILY   ferrous sulfate 325 (65 FE) MG EC tablet Take 1 tablet (325 mg total) by mouth 2 (two) times daily.   fish oil-omega-3 fatty acids 1000 MG capsule Take 2 g by mouth 2 (two) times daily.   hydrochlorothiazide (HYDRODIURIL) 25 MG tablet Take 1 tablet (25 mg total) by mouth daily.   latanoprost (XALATAN) 0.005 % ophthalmic solution Place 1 drop into the left eye at bedtime.    lisinopril (ZESTRIL) 40 MG tablet Take 1 tablet (40 mg total) by mouth daily.   melatonin 5 MG TABS Take 5 mg by mouth at bedtime.   Multiple Vitamin (MULTIVITAMIN) tablet Take 1 tablet by mouth daily.   pantoprazole (PROTONIX) 40 MG tablet Take 40 mg by mouth daily.   potassium chloride (KLOR-CON) 10 MEQ tablet TAKE 1 TABLET BY MOUTH  DAILY   pravastatin (PRAVACHOL) 40 MG tablet TAKE 1 TABLET BY MOUTH  DAILY   SYMBICORT 160-4.5 MCG/ACT inhaler USE 2 INHALATIONS BY MOUTH  TWICE DAILY   traZODone (DESYREL) 50 MG tablet TAKE 1 TABLET BY MOUTH AT  BEDTIME   Turmeric 500 MG CAPS Take 500 mg by mouth daily.    venlafaxine XR (EFFEXOR-XR) 75 MG 24 hr capsule TAKE 1  CAPSULE BY MOUTH  DAILY WITH BREAKFAST   verapamil (CALAN-SR) 240 MG CR tablet TAKE 1 TABLET BY MOUTH AT  BEDTIME   vitamin E 180 MG (400 UNITS) capsule Take 180 mg by mouth daily.   No facility-administered encounter medications on file as of 04/15/2021.    Reviewed chart prior to disease state call. Spoke with patient regarding BP  Recent Office Vitals: BP Readings from Last 3 Encounters:  11/20/20 128/60  11/13/20 (!) 170/82  07/02/20 (!) 150/80   Pulse Readings from Last 3 Encounters:  11/20/20 80  11/13/20 81  07/02/20 79    Wt Readings from Last 3 Encounters:  11/20/20 242 lb (109.8 kg)  11/13/20 248 lb 11.2 oz (112.8 kg)  07/02/20 237 lb (107.5 kg)     Kidney Function Lab Results  Component Value Date/Time   CREATININE 1.31 (H) 11/13/2020 02:38 PM   CREATININE 1.20 (H) 07/02/2020 12:05 PM   GFR 77.72 12/28/2010 09:03 AM   GFRNONAA 39 (L) 11/13/2020 02:38 PM   GFRAA 45 (L) 11/13/2020 02:38 PM    BMP Latest Ref Rng & Units 11/13/2020 07/02/2020 02/21/2020  Glucose 65 - 99 mg/dL 112(H) 92 118(H)  BUN 7 - 25 mg/dL '24 15 20  '$ Creatinine 0.60 - 0.93 mg/dL 1.31(H) 1.20(H) 1.38(H)  BUN/Creat Ratio 6 - 22 (calc) '18 13 14  '$ Sodium 135 -  146 mmol/L 141 142 143  Potassium 3.5 - 5.3 mmol/L 4.7 4.3 4.0  Chloride 98 - 110 mmol/L 107 106 105  CO2 20 - 32 mmol/L '23 26 26  '$ Calcium 8.6 - 10.4 mg/dL 9.9 10.5(H) 10.1    Current antihypertensive regimen:  Doxazosin '4mg'$  daily Lisinopril '40mg'$  daily  How often are you checking your Blood Pressure? Patient stated infrequently.  Current home BP readings: Patient stated her blood pressure readings have been high.   What recent interventions/DTPs have been made by any provider to improve Blood Pressure control since last CPP Visit: No  Any recent hospitalizations or ED visits since last visit with CPP? Patient stated no.   What diet changes have been made to improve Blood Pressure Control?  Patient stated she eats some fruits but not  much vegetables. She stated she doesn't drink a lot of water but we discussed the benefits in drinking water.  What exercise is being done to improve your Blood Pressure Control?  Patient stated she does not do any kind of exercising just daily actives.   Adherence Review: Is the patient currently on ACE/ARB medication?  Lisinopril 40 mg  Does the patient have >5 day gap between last estimated fill dates? Per misc rpts, no.   Star Rating Drugs: Lisinopril 40 mg 90 DS 04/12/21, Pravastatin 40 mg   Follow-Up:Pharmacist Review  Charlann Lange, RMA Clinical Pharmacist Assistant [phone]  10 minutes spent in review, coordination, and documentation.  Reviewed by: Beverly Milch, PharmD Clinical Pharmacist [phone]

## 2021-04-29 DIAGNOSIS — J449 Chronic obstructive pulmonary disease, unspecified: Secondary | ICD-10-CM | POA: Diagnosis not present

## 2021-05-07 ENCOUNTER — Telehealth: Payer: Self-pay

## 2021-05-07 ENCOUNTER — Ambulatory Visit (INDEPENDENT_AMBULATORY_CARE_PROVIDER_SITE_OTHER): Payer: Medicare Other

## 2021-05-07 ENCOUNTER — Other Ambulatory Visit: Payer: Self-pay

## 2021-05-07 VITALS — BP 114/62 | HR 96 | Temp 98.2°F | Ht 65.0 in | Wt 242.0 lb

## 2021-05-07 DIAGNOSIS — Z0000 Encounter for general adult medical examination without abnormal findings: Secondary | ICD-10-CM | POA: Diagnosis not present

## 2021-05-07 NOTE — Progress Notes (Signed)
Subjective:   Virginia Walters is a 80 y.o. female who presents for an Initial Medicare Annual Wellness Visit.  Review of Systems     Cardiac Risk Factors include: advanced age (>95men, >36 women);hypertension;dyslipidemia;obesity (BMI >30kg/m2);sedentary lifestyle     Objective:    Today's Vitals   05/07/21 1014 05/07/21 1020  BP: 114/62   Pulse: 96   Temp: 98.2 F (36.8 C)   PainSc:  5    There is no height or weight on file to calculate BMI.  Advanced Directives 05/07/2021 04/01/2020 02/11/2020 02/10/2020 02/10/2020 07/30/2018 07/20/2018  Does Patient Have a Medical Advance Directive? Yes No Yes Yes No Yes Yes  Type of Paramedic of [place];Living will - [place];Living will [place];Living will - [place];Living will [place];Living will  Does patient want to make changes to medical advance directive? No - Patient declined - - No - Patient declined - No - Patient declined No - Patient declined  Copy of [place] in Chart? No - copy requested - - No - copy requested - No - copy requested No - copy requested  Would patient like information on creating a medical advance directive? - No - Patient declined - - - No - Patient declined No - Patient declined    Current Medications (verified) Outpatient Encounter Medications as of 05/07/2021  Medication Sig   B Complex-Biotin-FA (B-COMPLEX PO) Take 1 capsule by mouth daily.    Calcium Carbonate-Vitamin D (CALCIUM + D PO) Take 1 capsule by mouth 2 (two) times daily.   Cholecalciferol (VITAMIN D) 2000 UNITS CAPS Take 1 capsule by mouth daily.   dorzolamide (TRUSOPT) 2 % ophthalmic solution Place 1 drop into the left eye 2 (two) times daily.   doxazosin (CARDURA) 4 MG tablet TAKE 1/2 TABLET (2 MG) BY MOUTH DAILY   fish oil-omega-3 fatty acids 1000 MG capsule Take 2 g by mouth 2 (two) times daily.   hydrochlorothiazide  (HYDRODIURIL) 25 MG tablet Take 1 tablet (25 mg total) by mouth daily.   latanoprost (XALATAN) 0.005 % ophthalmic solution Place 1 drop into the left eye at bedtime.    lisinopril (ZESTRIL) 40 MG tablet Take 1 tablet (40 mg total) by mouth daily.   magnesium 30 MG tablet Take 30 mg by mouth 2 (two) times daily.   melatonin 5 MG TABS Take 5 mg by mouth at bedtime.   Multiple Vitamin (MULTIVITAMIN) tablet Take 1 tablet by mouth daily.   pantoprazole (PROTONIX) 40 MG tablet Take 40 mg by mouth daily.   potassium chloride (KLOR-CON) 10 MEQ tablet TAKE 1 TABLET BY MOUTH  DAILY   pravastatin (PRAVACHOL) 40 MG tablet TAKE 1 TABLET BY MOUTH  DAILY   SYMBICORT 160-4.5 MCG/ACT inhaler USE 2 INHALATIONS BY MOUTH  TWICE DAILY   traZODone (DESYREL) 50 MG tablet TAKE 1 TABLET BY MOUTH AT  BEDTIME   Turmeric 500 MG CAPS Take 500 mg by mouth daily.    venlafaxine XR (EFFEXOR-XR) 75 MG 24 hr capsule TAKE 1 CAPSULE BY MOUTH  DAILY WITH BREAKFAST   verapamil (CALAN-SR) 240 MG CR tablet TAKE 1 TABLET BY MOUTH AT  BEDTIME   vitamin E 180 MG (400 UNITS) capsule Take 180 mg by mouth daily.   ferrous sulfate 325 (65 FE) MG EC tablet Take 1 tablet (325 mg total) by mouth 2 (two) times daily.   No facility-administered encounter medications on file as  of 05/07/2021.    Allergies (verified) Oxycodone   History: Past Medical History:  Diagnosis Date   CHF (congestive heart failure) (HCC)    Dyslipidemia    Frequent falls    GERD (gastroesophageal reflux disease)    HTN (hypertension)    Memory loss    PFO (patent foramen ovale)    Retinal artery occlusion    Weakness    Past Surgical History:  Procedure Laterality Date   ANKLE ARTHROSCOPY WITH OPEN REDUCTION INTERNAL FIXATION (ORIF) Right    APPENDECTOMY     COLONOSCOPY N/A 07/21/2018   Procedure: COLONOSCOPY;  Surgeon: Lin Landsman, MD;  Location: [facility];  Service: Gastroenterology;  Laterality: N/A;   COLONOSCOPY WITH PROPOFOL N/A  04/01/2020   Procedure: COLONOSCOPY WITH PROPOFOL;  Surgeon: Virgel Manifold, MD;  Location: [facility] ENDOSCOPY;  Service: Endoscopy;  Laterality: N/A;   ESOPHAGOGASTRODUODENOSCOPY N/A 07/21/2018   Procedure: ESOPHAGOGASTRODUODENOSCOPY (EGD);  Surgeon: Lin Landsman, MD;  Location: [facility] ENDOSCOPY;  Service: Gastroenterology;  Laterality: N/A;   ESOPHAGOGASTRODUODENOSCOPY N/A 02/11/2020   Procedure: ESOPHAGOGASTRODUODENOSCOPY (EGD);  Surgeon: Virgel Manifold, MD;  Location: [facility] ENDOSCOPY;  Service: Endoscopy;  Laterality: N/A;   ESOPHAGOGASTRODUODENOSCOPY (EGD) WITH PROPOFOL N/A 04/01/2020   Procedure: ESOPHAGOGASTRODUODENOSCOPY (EGD) WITH PROPOFOL;  Surgeon: Virgel Manifold, MD;  Location: [facility] ENDOSCOPY;  Service: Endoscopy;  Laterality: N/A;   TUBAL LIGATION     Family History  Problem Relation Age of Onset   Esophageal cancer Father    Dementia Mother    Social History   Socioeconomic History   Marital status: Widowed    Spouse name: Not on file   Number of children: 1   Years of education: HS   Highest education level: Not on file  Occupational History   Occupation: Retired  Tobacco Use   Smoking status: Former    Packs/day: 1.00    Years: 15.00    Pack years: 15.00    Types: Cigarettes    Quit date: 12/27/1980    Years since quitting: 40.3   Smokeless tobacco: Never  Vaping Use   Vaping Use: Never used  Substance and Sexual Activity   Alcohol use: Yes    Comment: occassionally   Drug use: No   Sexual activity: Not Currently  Other Topics Concern   Not on file  Social History Narrative   Lives at home alone. Husband passed away 11/28/2012.   Right-handed.   No daily use of caffeine.   Daughter lives in [place].   Social Determinants of Health   Financial Resource Strain: Low Risk    Difficulty of Paying Living Expenses: Not hard at all  Food Insecurity: No Food Insecurity   Worried About Charity fundraiser in the Last Year: Never true   [place] in the Last Year: Never true  Transportation Needs: No Transportation Needs   Lack of Transportation (Medical): No   Lack of Transportation (Non-Medical): No  Physical Activity: Inactive   Days of Exercise per Week: 0 days   Minutes of Exercise per Session: 0 min  Stress: No Stress Concern Present   Feeling of Stress : Not at all  Social Connections: Moderately Integrated   Frequency of Communication with Friends and Family: Three times a week   Frequency of Social Gatherings with Friends and Family: Three times a week   Attends Religious Services: More than 4 times per year   Active Member of Clubs or Organizations: Yes   Attends Club or  Organization Meetings: More than 4 times per year   Marital Status: Widowed    Tobacco Counseling Counseling given: Not Answered   Clinical Intake:  Pre-visit preparation completed: Yes  Pain : 0-10 Pain Score: 5  Pain Location: Back Pain Orientation: Lower Pain Descriptors / Indicators: Aching Pain Onset: More than a month ago Pain Frequency: Intermittent     Nutritional Risks: None Diabetes: No  How often do you need to have someone help you when you read instructions, pamphlets, or other written materials from your doctor or pharmacy?: 1 - Never  Diabetic?NP  Interpreter Needed?: No  Information entered by :: Randal Buba, LPN   Activities of Daily Living In your present state of health, do you have any difficulty performing the following activities: 05/07/2021  Hearing? N  Vision? Y  Comment Loss of vision in R eye.  Difficulty concentrating or making decisions? Y  Comment Yes per pt.  Walking or climbing stairs? N  Dressing or bathing? N  Doing errands, shopping? N  Preparing Food and eating ? N  Using the Toilet? N  In the past six months, have you accidently leaked urine? Y  Comment Wears Depends.  Do you have problems with loss of bowel control? N  Managing your Medications? N  Managing your Finances? N   Housekeeping or managing your Housekeeping? N  Some recent data might be hidden    Patient Care Team: Susy Frizzle, MD as PCP - General (Family Medicine) Alisa Graff, FNP as Nurse Practitioner (Family Medicine) Theora Gianotti, NP as Nurse Practitioner (Cardiology) Edythe Clarity, [facility] as Pharmacist (Pharmacist)  Indicate any recent Medical Services you may have received from other than [facility] providers in the past year (date may be approximate).     Assessment:   This is a routine wellness examination for Rileyann.  Hearing/Vision screen Hearing Screening - Comments:: No hearing issues. Vision Screening - Comments:: Glasses. Eye exam 4 months ago. Dr. Janeth Rase. Has cataract surgery scheduled for 07/2021.  Dietary issues and exercise activities discussed: Current Exercise Habits: The patient does not participate in regular exercise at present, Exercise limited by: cardiac condition(s);neurologic condition(s);orthopedic condition(s)   Goals Addressed             This Visit's Progress    DIET - INCREASE WATER INTAKE         Depression Screen PHQ 2/9 Scores 05/07/2021 09/26/2019 08/31/2017  PHQ - 2 Score 0 6 3  PHQ- 9 Score - 21 8    Fall Risk Fall Risk  05/07/2021 10/08/2018 08/29/2018 08/31/2017  Falls in the past year? 1 0 0 Yes  Number falls in past yr: 0 0 0 1  Injury with Fall? 1 0 0 Yes  Risk for fall due to : History of fall(s);Impaired balance/gait;Impaired mobility;Impaired vision - - -  Follow up Falls prevention discussed - - Falls evaluation completed    FALL RISK PREVENTION PERTAINING TO THE HOME:  Any stairs in or around the home? No  If so, are there any without handrails? No  Home free of loose throw rugs in walkways, pet beds, electrical cords, etc? Yes  Adequate lighting in your home to reduce risk of falls? Yes   ASSISTIVE DEVICES UTILIZED TO PREVENT FALLS:  Life alert? Yes  Use of a cane, walker or w/c? Yes  Grab bars in  the bathroom? Yes  Shower chair or bench in shower? Yes  Elevated toilet seat or a handicapped toilet? Yes  TIMED UP AND GO:  Was the test performed? Yes .  Length of time to ambulate 10 feet: 10 sec.   Gait slow and steady with assistive device  Cognitive Function: MMSE - Mini Mental State Exam 10/12/2016  Orientation to time 3  Orientation to Place 5  Registration 3  Attention/ Calculation 5  Recall 2  Language- name 2 objects 2  Language- repeat 1  Language- follow 3 step command 3  Language- read & follow direction 1  Write a sentence 1  Copy design 0  Total score 26     6CIT Screen 05/07/2021  What Year? 0 points  What month? 0 points  What time? 0 points  Count back from 20 0 points  Months in reverse 4 points  Repeat phrase 2 points  Total Score 6    Immunizations Immunization History  Administered Date(s) Administered   Fluad Quad(high Dose 65+) 06/06/2019, 07/02/2020   Influenza, High Dose Seasonal PF 05/23/2017, 06/05/2018   Moderna Sars-Covid-2 Vaccination 11/14/2019, 12/11/2019   Pneumococcal Polysaccharide-23 06/06/2019    TDAP status: Due, Education has been provided regarding the importance of this vaccine. Advised may receive this vaccine at local pharmacy or Health Dept. Aware to provide a copy of the vaccination record if obtained from local pharmacy or Health Dept. Verbalized acceptance and understanding.  Flu Vaccine status: Due, Education has been provided regarding the importance of this vaccine. Advised may receive this vaccine at local pharmacy or Health Dept. Aware to provide a copy of the vaccination record if obtained from local pharmacy or Health Dept. Verbalized acceptance and understanding.  Pneumococcal vaccine status: Up to date  Covid-19 vaccine status: Completed vaccines  Qualifies for Shingles Vaccine? Yes   Zostavax completed No   Shingrix Completed?: No.    Education has been provided regarding the importance of this vaccine.  Patient has been advised to call insurance company to determine out of pocket expense if they have not yet received this vaccine. Advised may also receive vaccine at local pharmacy or Health Dept. Verbalized acceptance and understanding.  Screening Tests Health Maintenance  Topic Date Due   Zoster Vaccines- Shingrix (1 of 2) Never done   COVID-19 Vaccine (3 - Booster for Moderna series) 05/12/2020   INFLUENZA VACCINE  03/15/2021   TETANUS/TDAP  11/13/2021 (Originally 04/17/2019)   Hepatitis C Screening  11/13/2021 (Originally 07/05/1959)   COLONOSCOPY (Pts 45-31yrs Insurance coverage will need to be confirmed)  04/02/2027   DEXA SCAN  Completed   HPV VACCINES  Aged Out    Health Maintenance  Health Maintenance Due  Topic Date Due   Zoster Vaccines- Shingrix (1 of 2) Never done   COVID-19 Vaccine (3 - Booster for Moderna series) 05/12/2020   INFLUENZA VACCINE  03/15/2021    Colorectal cancer screening: Type of screening: Colonoscopy. Completed 04/01/2020. Repeat every 7 years  Mammogram status: No longer required due to AGE.  Bone Density status: Completed 10/21/2013. Results reflect: Bone density results: NORMAL. Repeat every 2 years.  Lung Cancer Screening: (Low Dose CT Chest recommended if Age 41-80 years, 30 pack-year currently smoking OR have quit w/in 15years.) does not qualify.    Additional Screening:  Hepatitis C Screening: does qualify; Completed NOT DONE.  Vision Screening: Recommended annual ophthalmology exams for early detection of glaucoma and other disorders of the eye. Is the patient up to date with their annual eye exam?  Yes  Who is the provider or what is the name of the office in which  the patient attends annual eye exams? DR. HECKER If pt is not established with a provider, would they like to be referred to a provider to establish care? No .   Dental Screening: Recommended annual dental exams for proper oral hygiene  Community Resource Referral / Chronic  Care Management: CRR required this visit?  No   CCM required this visit?  No      Plan:     I have personally reviewed and noted the following in the patient's chart:   Medical and social history Use of alcohol, tobacco or illicit drugs  Current medications and supplements including opioid prescriptions. Patient is not currently taking opioid prescriptions. Functional ability and status Nutritional status Physical activity Advanced directives List of other physicians Hospitalizations, surgeries, and ER visits in previous 12 months Vitals Screenings to include cognitive, depression, and falls Referrals and appointments  In addition, I have reviewed and discussed with patient certain preventive protocols, quality metrics, and best practice recommendations. A written personalized care plan for preventive services as well as general preventive health recommendations were provided to patient.     Chriss Driver, LPN   12/11/7679   Nurse Notes: Pt states she is doing well other than lower back pain. Discussed vaccines due. Declines bone density. No longer does mammograms. Colonoscopy done 04/01/2020, repeat due in 7 years.

## 2021-05-07 NOTE — Patient Instructions (Signed)
Virginia Walters , Thank you for taking time to come for your Medicare Wellness Visit. I appreciate your ongoing commitment to your health goals. Please review the following plan we discussed and let me know if I can assist you in the future.   Screening recommendations/referrals: Colonoscopy: Done 04/01/2020  Mammogram: No longer required. Bone Density: Done 10/21/2013.  Recommended yearly ophthalmology/optometry visit for glaucoma screening and checkup Recommended yearly dental visit for hygiene and checkup  Vaccinations: Influenza vaccine: Due. Repeat annually  Pneumococcal vaccine: Done 02/10/14 & 06/06/2019  Tdap vaccine: Done 04/16/2009 Repeat in 10 years  Shingles vaccine: Shingrix discussed. Please contact your pharmacy for coverage information.     Covid-19:Done 11/14/19 & 12/10/20.   Advanced directives: Please bring a copy of your health care power of attorney and living will to the office to be added to your chart at your convenience.   Conditions/risks identified: Aim for 30 minutes of exercise each day, drink 6-8 glasses of water and eat lots of fruits and vegetables.   Next appointment: Follow up in one year for your annual wellness visit 2023.   Preventive Care 28 Years and Older, Female Preventive care refers to lifestyle choices and visits with your health care provider that can promote health and wellness. What does preventive care include? A yearly physical exam. This is also called an annual well check. Dental exams once or twice a year. Routine eye exams. Ask your health care provider how often you should have your eyes checked. Personal lifestyle choices, including: Daily care of your teeth and gums. Regular physical activity. Eating a healthy diet. Avoiding tobacco and drug use. Limiting alcohol use. Practicing safe sex. Taking low-dose aspirin every day. Taking vitamin and mineral supplements as recommended by your health care provider. What happens during an  annual well check? The services and screenings done by your health care provider during your annual well check will depend on your age, overall health, lifestyle risk factors, and family history of disease. Counseling  Your health care provider may ask you questions about your: Alcohol use. Tobacco use. Drug use. Emotional well-being. Home and relationship well-being. Sexual activity. Eating habits. History of falls. Memory and ability to understand (cognition). Work and work Statistician. Reproductive health. Screening  You may have the following tests or measurements: Height, weight, and BMI. Blood pressure. Lipid and cholesterol levels. These may be checked every 5 years, or more frequently if you are over 14 years old. Skin check. Lung cancer screening. You may have this screening every year starting at age 35 if you have a 30-pack-year history of smoking and currently smoke or have quit within the past 15 years. Fecal occult blood test (FOBT) of the stool. You may have this test every year starting at age 53. Flexible sigmoidoscopy or colonoscopy. You may have a sigmoidoscopy every 5 years or a colonoscopy every 10 years starting at age 65. Hepatitis C blood test. Hepatitis B blood test. Sexually transmitted disease (STD) testing. Diabetes screening. This is done by checking your blood sugar (glucose) after you have not eaten for a while (fasting). You may have this done every 1-3 years. Bone density scan. This is done to screen for osteoporosis. You may have this done starting at age 66. Mammogram. This may be done every 1-2 years. Talk to your health care provider about how often you should have regular mammograms. Talk with your health care provider about your test results, treatment options, and if necessary, the need for more tests. Vaccines  Your health care provider may recommend certain vaccines, such as: Influenza vaccine. This is recommended every year. Tetanus,  diphtheria, and acellular pertussis (Tdap, Td) vaccine. You may need a Td booster every 10 years. Zoster vaccine. You may need this after age 79. Pneumococcal 13-valent conjugate (PCV13) vaccine. One dose is recommended after age 12. Pneumococcal polysaccharide (PPSV23) vaccine. One dose is recommended after age 73. Talk to your health care provider about which screenings and vaccines you need and how often you need them. This information is not intended to replace advice given to you by your health care provider. Make sure you discuss any questions you have with your health care provider. Document Released: 08/28/2015 Document Revised: 04/20/2016 Document Reviewed: 06/02/2015 Elsevier Interactive Patient Education  2017 [place] Prevention in the Home Falls can cause injuries. They can happen to people of all ages. There are many things you can do to make your home safe and to help prevent falls. What can I do on the outside of my home? Regularly fix the edges of walkways and driveways and fix any cracks. Remove anything that might make you trip as you walk through a door, such as a raised step or threshold. Trim any bushes or trees on the path to your home. Use bright outdoor lighting. Clear any walking paths of anything that might make someone trip, such as rocks or tools. Regularly check to see if handrails are loose or broken. Make sure that both sides of any steps have handrails. Any raised decks and porches should have guardrails on the edges. Have any leaves, snow, or ice cleared regularly. Use sand or salt on walking paths during winter. Clean up any spills in your garage right away. This includes oil or grease spills. What can I do in the bathroom? Use night lights. Install grab bars by the toilet and in the tub and shower. Do not use towel bars as grab bars. Use non-skid mats or decals in the tub or shower. If you need to sit down in the shower, use a plastic,  non-slip stool. Keep the floor dry. Clean up any water that spills on the floor as soon as it happens. Remove soap buildup in the tub or shower regularly. Attach bath mats securely with double-sided non-slip rug tape. Do not have throw rugs and other things on the floor that can make you trip. What can I do in the bedroom? Use night lights. Make sure that you have a light by your bed that is easy to reach. Do not use any sheets or blankets that are too big for your bed. They should not hang down onto the floor. Have a firm chair that has side arms. You can use this for support while you get dressed. Do not have throw rugs and other things on the floor that can make you trip. What can I do in the kitchen? Clean up any spills right away. Avoid walking on wet floors. Keep items that you use a lot in easy-to-reach places. If you need to reach something above you, use a strong step stool that has a grab bar. Keep electrical cords out of the way. Do not use floor polish or wax that makes floors slippery. If you must use wax, use non-skid floor wax. Do not have throw rugs and other things on the floor that can make you trip. What can I do with my stairs? Do not leave any items on the stairs. Make sure that there are  handrails on both sides of the stairs and use them. Fix handrails that are broken or loose. Make sure that handrails are as long as the stairways. Check any carpeting to make sure that it is firmly attached to the stairs. Fix any carpet that is loose or worn. Avoid having throw rugs at the top or bottom of the stairs. If you do have throw rugs, attach them to the floor with carpet tape. Make sure that you have a light switch at the top of the stairs and the bottom of the stairs. If you do not have them, ask someone to add them for you. What else can I do to help prevent falls? Wear shoes that: Do not have high heels. Have rubber bottoms. Are comfortable and fit you well. Are closed  at the toe. Do not wear sandals. If you use a stepladder: Make sure that it is fully opened. Do not climb a closed stepladder. Make sure that both sides of the stepladder are locked into place. Ask someone to hold it for you, if possible. Clearly mark and make sure that you can see: Any grab bars or handrails. First and last steps. Where the edge of each step is. Use tools that help you move around (mobility aids) if they are needed. These include: Canes. Walkers. Scooters. Crutches. Turn on the lights when you go into a dark area. Replace any light bulbs as soon as they burn out. Set up your furniture so you have a clear path. Avoid moving your furniture around. If any of your floors are uneven, fix them. If there are any pets around you, be aware of where they are. Review your medicines with your doctor. Some medicines can make you feel dizzy. This can increase your chance of falling. Ask your doctor what other things that you can do to help prevent falls. This information is not intended to replace advice given to you by your health care provider. Make sure you discuss any questions you have with your health care provider. Document Released: 05/28/2009 Document Revised: 01/07/2016 Document Reviewed: 09/05/2014 Elsevier Interactive Patient Education  2017 [employer].

## 2021-05-07 NOTE — Telephone Encounter (Signed)
Pt here for AWV. Pt c/o lower back pain. Pt states when she saw you last she was given medication to take. Pt states the medication did not help. Pt asks if she is a candidate for injections in her back? Thank you!

## 2021-05-29 ENCOUNTER — Other Ambulatory Visit: Payer: Self-pay | Admitting: Family Medicine

## 2021-05-29 DIAGNOSIS — J449 Chronic obstructive pulmonary disease, unspecified: Secondary | ICD-10-CM | POA: Diagnosis not present

## 2021-06-06 ENCOUNTER — Other Ambulatory Visit: Payer: Self-pay | Admitting: Family Medicine

## 2021-06-06 DIAGNOSIS — I10 Essential (primary) hypertension: Secondary | ICD-10-CM

## 2021-06-29 DIAGNOSIS — J449 Chronic obstructive pulmonary disease, unspecified: Secondary | ICD-10-CM | POA: Diagnosis not present

## 2021-07-04 ENCOUNTER — Other Ambulatory Visit: Payer: Self-pay | Admitting: Family Medicine

## 2021-07-20 ENCOUNTER — Telehealth: Payer: Self-pay | Admitting: Pharmacist

## 2021-07-20 NOTE — Progress Notes (Addendum)
    Chronic Care Management Pharmacy Assistant   Name: JAZMYN OFFNER  MRN: [id number] DOB: Nov 02, 1940   Reason for Encounter: Disease State - General Adherence Call     Recent office visits:  None noted.   Recent consult visits:  None noted.   Hospital visits:  None in previous 6 months  Medications: Outpatient Encounter Medications as of 07/20/2021  Medication Sig   B Complex-Biotin-FA (B-COMPLEX PO) Take 1 capsule by mouth daily.    Calcium Carbonate-Vitamin D (CALCIUM + D PO) Take 1 capsule by mouth 2 (two) times daily.   Cholecalciferol (VITAMIN D) 2000 UNITS CAPS Take 1 capsule by mouth daily.   dorzolamide (TRUSOPT) 2 % ophthalmic solution Place 1 drop into the left eye 2 (two) times daily.   doxazosin (CARDURA) 4 MG tablet TAKE ONE-HALF TABLET BY  MOUTH DAILY   ferrous sulfate 325 (65 FE) MG EC tablet Take 1 tablet (325 mg total) by mouth 2 (two) times daily.   fish oil-omega-3 fatty acids 1000 MG capsule Take 2 g by mouth 2 (two) times daily.   hydrochlorothiazide (HYDRODIURIL) 25 MG tablet Take 1 tablet (25 mg total) by mouth daily.   latanoprost (XALATAN) 0.005 % ophthalmic solution Place 1 drop into the left eye at bedtime.    lisinopril (ZESTRIL) 40 MG tablet TAKE 1 TABLET BY MOUTH  DAILY   magnesium 30 MG tablet Take 30 mg by mouth 2 (two) times daily.   melatonin 5 MG TABS Take 5 mg by mouth at bedtime.   Multiple Vitamin (MULTIVITAMIN) tablet Take 1 tablet by mouth daily.   pantoprazole (PROTONIX) 40 MG tablet Take 40 mg by mouth daily.   potassium chloride (KLOR-CON) 10 MEQ tablet TAKE 1 TABLET BY MOUTH  DAILY   pravastatin (PRAVACHOL) 40 MG tablet TAKE 1 TABLET BY MOUTH  DAILY   SYMBICORT 160-4.5 MCG/ACT inhaler USE 2 INHALATIONS BY MOUTH  TWICE DAILY   traZODone (DESYREL) 50 MG tablet TAKE 1 TABLET BY MOUTH AT  BEDTIME   Turmeric 500 MG CAPS Take 500 mg by mouth daily.    venlafaxine XR (EFFEXOR-XR) 75 MG 24 hr capsule TAKE 1 CAPSULE BY MOUTH  DAILY WITH  BREAKFAST   verapamil (CALAN-SR) 240 MG CR tablet TAKE 1 TABLET BY MOUTH AT  BEDTIME   vitamin E 180 MG (400 UNITS) capsule Take 180 mg by mouth daily.   No facility-administered encounter medications on file as of 07/20/2021.    Have you had any problems recently with your health?   Have you had any problems with your pharmacy?   What issues or side effects are you having with your medications?   What would you like me to pass along to Leata Mouse, CPP for them to help you with?    What can we do to take care of you better?   Care Gaps  AWV: overdue  Colonoscopy: done 04/01/20 DM Eye Exam:  N/A DM Foot Exam: N/A Microalbumin: N/A HbgAIC: done 11/13/20 (8.5) DEXA: N/A Mammogram: done 12/09/20  Star Rating Drugs: pravastatin (PRAVACHOL) 40 MG tablet - last filled 01/28/21 90 days  lisinopril (ZESTRIL) 40 MG tablet - last filled 04/12/21 90 days   Future Appointments  Date Time Provider Barrett  05/13/2022 10:30 AM [facility]-NURSE HEALTH ADVISOR [facility] None    Multiple attempts were made to contact patient. Attempts were unsuccessful. / ls,CMA    Jobe Gibbon, [place] Pharmacist Assistant  [phone]

## 2021-07-22 ENCOUNTER — Emergency Department (HOSPITAL_COMMUNITY)
Admission: EM | Admit: 2021-07-22 | Discharge: 2021-07-22 | Disposition: A | Discharge status: Home / Self Care | Payer: Medicare Other | Attending: Emergency Medicine | Admitting: Emergency Medicine

## 2021-07-22 ENCOUNTER — Emergency Department (HOSPITAL_COMMUNITY): Payer: Medicare Other

## 2021-07-22 ENCOUNTER — Other Ambulatory Visit: Payer: Self-pay

## 2021-07-22 DIAGNOSIS — I6523 Occlusion and stenosis of bilateral carotid arteries: Secondary | ICD-10-CM | POA: Diagnosis not present

## 2021-07-22 DIAGNOSIS — W1811XA Fall from or off toilet without subsequent striking against object, initial encounter: Secondary | ICD-10-CM | POA: Diagnosis not present

## 2021-07-22 DIAGNOSIS — Z87891 Personal history of nicotine dependence: Secondary | ICD-10-CM | POA: Diagnosis not present

## 2021-07-22 DIAGNOSIS — W19XXXA Unspecified fall, initial encounter: Secondary | ICD-10-CM

## 2021-07-22 DIAGNOSIS — G939 Disorder of brain, unspecified: Secondary | ICD-10-CM | POA: Diagnosis not present

## 2021-07-22 DIAGNOSIS — Y92009 Unspecified place in unspecified non-institutional (private) residence as the place of occurrence of the external cause: Secondary | ICD-10-CM | POA: Diagnosis not present

## 2021-07-22 DIAGNOSIS — I878 Other specified disorders of veins: Secondary | ICD-10-CM | POA: Diagnosis not present

## 2021-07-22 DIAGNOSIS — I5022 Chronic systolic (congestive) heart failure: Secondary | ICD-10-CM | POA: Insufficient documentation

## 2021-07-22 DIAGNOSIS — J449 Chronic obstructive pulmonary disease, unspecified: Secondary | ICD-10-CM | POA: Diagnosis not present

## 2021-07-22 DIAGNOSIS — Z79899 Other long term (current) drug therapy: Secondary | ICD-10-CM | POA: Insufficient documentation

## 2021-07-22 DIAGNOSIS — I130 Hypertensive heart and chronic kidney disease with heart failure and stage 1 through stage 4 chronic kidney disease, or unspecified chronic kidney disease: Secondary | ICD-10-CM | POA: Insufficient documentation

## 2021-07-22 DIAGNOSIS — R0902 Hypoxemia: Secondary | ICD-10-CM | POA: Diagnosis not present

## 2021-07-22 DIAGNOSIS — S0990XA Unspecified injury of head, initial encounter: Secondary | ICD-10-CM | POA: Insufficient documentation

## 2021-07-22 DIAGNOSIS — M47812 Spondylosis without myelopathy or radiculopathy, cervical region: Secondary | ICD-10-CM | POA: Diagnosis not present

## 2021-07-22 DIAGNOSIS — M25519 Pain in unspecified shoulder: Secondary | ICD-10-CM | POA: Diagnosis not present

## 2021-07-22 DIAGNOSIS — I10 Essential (primary) hypertension: Secondary | ICD-10-CM | POA: Diagnosis not present

## 2021-07-22 DIAGNOSIS — R404 Transient alteration of awareness: Secondary | ICD-10-CM | POA: Diagnosis not present

## 2021-07-22 DIAGNOSIS — N3000 Acute cystitis without hematuria: Secondary | ICD-10-CM | POA: Diagnosis not present

## 2021-07-22 DIAGNOSIS — Z043 Encounter for examination and observation following other accident: Secondary | ICD-10-CM | POA: Diagnosis not present

## 2021-07-22 DIAGNOSIS — S199XXA Unspecified injury of neck, initial encounter: Secondary | ICD-10-CM | POA: Diagnosis not present

## 2021-07-22 DIAGNOSIS — N1831 Chronic kidney disease, stage 3a: Secondary | ICD-10-CM | POA: Insufficient documentation

## 2021-07-22 DIAGNOSIS — J341 Cyst and mucocele of nose and nasal sinus: Secondary | ICD-10-CM | POA: Diagnosis not present

## 2021-07-22 DIAGNOSIS — R6889 Other general symptoms and signs: Secondary | ICD-10-CM | POA: Diagnosis not present

## 2021-07-22 DIAGNOSIS — M25511 Pain in right shoulder: Secondary | ICD-10-CM | POA: Diagnosis not present

## 2021-07-22 DIAGNOSIS — Z743 Need for continuous supervision: Secondary | ICD-10-CM | POA: Diagnosis not present

## 2021-07-22 LAB — URINALYSIS, ROUTINE W REFLEX MICROSCOPIC
Bilirubin Urine: NEGATIVE
Glucose, UA: NEGATIVE mg/dL
Hgb urine dipstick: NEGATIVE
Ketones, ur: NEGATIVE mg/dL
Nitrite: POSITIVE — AB
Protein, ur: 30 mg/dL — AB
Specific Gravity, Urine: >1.03 — ABNORMAL HIGH (ref 1.005–1.030)
pH: 6 (ref 5.0–8.0)

## 2021-07-22 LAB — COMPREHENSIVE METABOLIC PANEL
ALT: 25 U/L (ref 0–44)
AST: 22 U/L (ref 15–41)
Albumin: 3.5 g/dL (ref 3.5–5.0)
Alkaline Phosphatase: 68 U/L (ref 38–126)
Anion gap: 9 (ref 5–15)
BUN: 11 mg/dL (ref 8–23)
CO2: 23 mmol/L (ref 22–32)
Calcium: 9.6 mg/dL (ref 8.9–10.3)
Chloride: 104 mmol/L (ref 98–111)
Creatinine, Ser: 1.17 mg/dL — ABNORMAL HIGH (ref 0.44–1.00)
GFR, Estimated: 47 mL/min — ABNORMAL LOW (ref >60)
Glucose, Bld: 145 mg/dL — ABNORMAL HIGH (ref 70–99)
Potassium: 3.5 mmol/L (ref 3.5–5.1)
Sodium: 136 mmol/L (ref 135–145)
Total Bilirubin: 0.8 mg/dL (ref 0.3–1.2)
Total Protein: 6.7 g/dL (ref 6.5–8.1)

## 2021-07-22 LAB — I-STAT VENOUS BLOOD GAS, ED
Acid-base deficit: 1 mmol/L (ref 0.0–2.0)
Bicarbonate: 23 mmol/L (ref 20.0–28.0)
Calcium, Ion: 1.25 mmol/L (ref 1.15–1.40)
HCT: 41 % (ref 36.0–46.0)
Hemoglobin: 13.9 g/dL (ref 12.0–15.0)
O2 Saturation: 90 %
Potassium: 3.6 mmol/L (ref 3.5–5.1)
Sodium: 139 mmol/L (ref 135–145)
TCO2: 24 mmol/L (ref 22–32)
pCO2, Ven: 36.1 mmHg — ABNORMAL LOW (ref 44.0–60.0)
pH, Ven: 7.412 (ref 7.250–7.430)
pO2, Ven: 58 mmHg — ABNORMAL HIGH (ref 32.0–45.0)

## 2021-07-22 LAB — CBC WITH DIFFERENTIAL/PLATELET
Abs Immature Granulocytes: 0.03 10*3/uL (ref 0.00–0.07)
Basophils Absolute: 0 10*3/uL (ref 0.0–0.1)
Basophils Relative: 0 %
Eosinophils Absolute: 0 10*3/uL (ref 0.0–0.5)
Eosinophils Relative: 0 %
HCT: 42.1 % (ref 36.0–46.0)
Hemoglobin: 14.3 g/dL (ref 12.0–15.0)
Immature Granulocytes: 0 %
Lymphocytes Relative: 11 %
Lymphs Abs: 0.8 10*3/uL (ref 0.7–4.0)
MCH: 32.5 pg (ref 26.0–34.0)
MCHC: 34 g/dL (ref 30.0–36.0)
MCV: 95.7 fL (ref 80.0–100.0)
Monocytes Absolute: 0.8 10*3/uL (ref 0.1–1.0)
Monocytes Relative: 11 %
Neutro Abs: 5.6 10*3/uL (ref 1.7–7.7)
Neutrophils Relative %: 78 %
Platelets: 185 10*3/uL (ref 150–400)
RBC: 4.4 MIL/uL (ref 3.87–5.11)
RDW: 11.9 % (ref 11.5–15.5)
WBC: 7.3 10*3/uL (ref 4.0–10.5)
nRBC: 0 % (ref 0.0–0.2)

## 2021-07-22 LAB — I-STAT CHEM 8, ED
BUN: 12 mg/dL (ref 8–23)
Calcium, Ion: 1.24 mmol/L (ref 1.15–1.40)
Chloride: 103 mmol/L (ref 98–111)
Creatinine, Ser: 1.1 mg/dL — ABNORMAL HIGH (ref 0.44–1.00)
Glucose, Bld: 144 mg/dL — ABNORMAL HIGH (ref 70–99)
HCT: 42 % (ref 36.0–46.0)
Hemoglobin: 14.3 g/dL (ref 12.0–15.0)
Potassium: 3.5 mmol/L (ref 3.5–5.1)
Sodium: 138 mmol/L (ref 135–145)
TCO2: 23 mmol/L (ref 22–32)

## 2021-07-22 LAB — URINALYSIS, MICROSCOPIC (REFLEX)
RBC / HPF: NONE SEEN RBC/hpf (ref 0–5)
WBC, UA: >50 WBC/hpf (ref 0–5)

## 2021-07-22 LAB — RAPID URINE DRUG SCREEN, HOSP PERFORMED
Amphetamines: NOT DETECTED
Barbiturates: NOT DETECTED
Benzodiazepines: NOT DETECTED
Cocaine: NOT DETECTED
Opiates: NOT DETECTED
Tetrahydrocannabinol: NOT DETECTED

## 2021-07-22 LAB — ETHANOL: Alcohol, Ethyl (B): <10 mg/dL (ref <10)

## 2021-07-22 LAB — PROTIME-INR
INR: 1.1 (ref 0.8–1.2)
Prothrombin Time: 14.2 seconds (ref 11.4–15.2)

## 2021-07-22 LAB — AMMONIA: Ammonia: <10 umol/L (ref 9–35)

## 2021-07-22 LAB — APTT: aPTT: 31 seconds (ref 24–36)

## 2021-07-22 MED ORDER — LIDOCAINE HCL (PF) 1 % IJ SOLN
INTRAMUSCULAR | Status: AC
  Filled 2021-07-22: qty 5

## 2021-07-22 MED ORDER — CEFTRIAXONE SODIUM 1 G IJ SOLR
1.0000 g | Freq: Once | INTRAMUSCULAR | Status: AC
  Administered 2021-07-22: 1 g via INTRAMUSCULAR
  Filled 2021-07-22: qty 10

## 2021-07-22 MED ORDER — NITROFURANTOIN MONOHYD MACRO 100 MG PO CAPS: 100.0000 mg | ORAL_CAPSULE | Freq: Two times a day (BID) | ORAL | 0 refills | Status: DC

## 2021-07-22 MED ORDER — SODIUM CHLORIDE 0.9 % IV SOLN: 1.0000 g | Freq: Once | INTRAVENOUS | Status: DC

## 2021-07-22 NOTE — ED Triage Notes (Signed)
Pt arrives from home for c/o falling off of the tiolet today. Down 1 hour after pressing her medical alarm. C/o right shoulder pain. Right head hematoma present on arrival. EMS states that she was unable to tell them what year it is. Family reports that there has been somewhat of a mental decline lately but she is normally able to tell the correct year. Pt also recently stopped taking all of her meds on her own. Family had her restarted on med last Tuesday. Pt AxOx 4 at this time.

## 2021-07-22 NOTE — ED Notes (Signed)
Pt now states that it is [place]

## 2021-07-22 NOTE — ED Provider Notes (Signed)
[facility] EMERGENCY DEPARTMENT Provider Note   CSN: [id number] Arrival date & time: 07/22/21  1700     History Chief Complaint  Patient presents with   Fall   Altered Mental Status    Virginia Walters is a 80 y.o. female.  The history is provided by the patient, the EMS personnel and medical records.  Trauma Mechanism of injury: Fall Injury location: head/neck, pelvis and shoulder/arm Injury location detail: head, R shoulder and R hip Incident location: home Time since incident: 2 hours Arrived directly from scene: yes   Fall:      Fall occurred: in the bathroom  Protective equipment:       None      Suspicion of alcohol use: no      Suspicion of drug use: no  EMS/PTA data:      Bystander interventions: none      Ambulatory at scene: no      Blood loss: none      Responsiveness: alert      Oriented to: person      IV access: established      Immobilization: C-collar      Airway condition since incident: stable      Breathing condition since incident: stable      Circulation condition since incident: stable  Current symptoms:      Associated symptoms:            Reports headache.            Denies abdominal pain, back pain, chest pain, seizures and vomiting.   Relevant PMH:      Pharmacological risk factors:            No anticoagulation therapy.      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, stage 3a ([place]) 02/10/2020    COPD exacerbation ([place]) 53/97/6734   Chronic systolic heart failure ([place]) 08/29/2018   Acute on chronic heart failure with preserved ejection fraction (HCC)    Symptomatic anemia 07/20/2018   Dementia with behavioral problem 10/26/2016   Alcohol abuse 10/26/2016   Memory loss 10/12/2016   Gait abnormality 10/12/2016   Orthostatic dizziness 10/12/2016   Paresthesia 10/12/2016   Abnormal EKG 12/28/2010   HTN (hypertension)    Dyslipidemia    PFO (patent foramen ovale)    Retinal artery occlusion    GERD (gastroesophageal reflux disease)     Past Surgical History:  Procedure Laterality Date   ANKLE ARTHROSCOPY WITH OPEN REDUCTION INTERNAL FIXATION (ORIF) Right    APPENDECTOMY     COLONOSCOPY N/A 07/21/2018   Procedure: COLONOSCOPY;  Surgeon: Lin Landsman, MD;  Location: [facility] ENDOSCOPY;  Service: Gastroenterology;  Laterality: N/A;   COLONOSCOPY WITH PROPOFOL N/A 04/01/2020   Procedure: COLONOSCOPY WITH PROPOFOL;  Surgeon: Virgel Manifold, MD;  Location: [facility] ENDOSCOPY;  Service: Endoscopy;  Laterality: N/A;   ESOPHAGOGASTRODUODENOSCOPY N/A 07/21/2018   Procedure: ESOPHAGOGASTRODUODENOSCOPY (EGD);  Surgeon: Lin Landsman, MD;  Location: [facility] ENDOSCOPY;  Service: Gastroenterology;  Laterality: N/A;   ESOPHAGOGASTRODUODENOSCOPY N/A 02/11/2020   Procedure: ESOPHAGOGASTRODUODENOSCOPY (EGD);  Surgeon: Virgel Manifold, MD;  Location: [facility] ENDOSCOPY;  Service: Endoscopy;  Laterality: N/A;   ESOPHAGOGASTRODUODENOSCOPY (EGD) WITH PROPOFOL N/A 04/01/2020   Procedure: ESOPHAGOGASTRODUODENOSCOPY (EGD) WITH PROPOFOL;  Surgeon: Virgel Manifold, MD;  Location: [facility] ENDOSCOPY;  Service: Endoscopy;  Laterality: N/A;   TUBAL LIGATION       OB History     Gravida  0   Para  0   Term  0   Preterm  0   AB  0   Living  0      SAB  0   IAB  0   Ectopic  0   Multiple  0   Live Births              Family History  Problem Relation Age of Onset    Esophageal cancer Father    Dementia Mother     Social History   Tobacco Use   Smoking status: Former    Packs/day: 1.00    Years: 15.00    Pack years: 15.00    Types: Cigarettes    Quit date: 12/27/1980    Years since quitting: 40.5   Smokeless tobacco: Never  Vaping Use   Vaping Use: Never used  Substance Use Topics   Alcohol use: Yes    Comment: occassionally   Drug use: No    Home Medications Prior to Admission medications   Medication Sig Start Date End Date Taking? Authorizing Provider  B Complex-Biotin-FA (B-COMPLEX PO) Take 1 capsule by mouth daily.     [provider]  Calcium Carbonate-Vitamin D (CALCIUM + D PO) Take 1 capsule by mouth 2 (two) times daily.    [provider]  Cholecalciferol (VITAMIN D) 2000 UNITS CAPS Take 1 capsule by mouth daily.    [provider]  dorzolamide (TRUSOPT) 2 % ophthalmic solution Place 1 drop into the left eye 2 (two) times daily.    [provider]  doxazosin (CARDURA) 4 MG tablet TAKE ONE-HALF TABLET BY  MOUTH DAILY 05/31/21   Susy Frizzle, MD  ferrous sulfate 325 (65 FE) MG EC tablet Take 1 tablet (325 mg total) by mouth 2 (two) times daily. 02/21/20 02/20/21  Susy Frizzle, MD  fish oil-omega-3 fatty acids 1000 MG capsule Take 2 g by mouth 2 (two) times daily.    [provider]  hydrochlorothiazide (HYDRODIURIL) 25 MG tablet Take 1 tablet (25 mg total) by mouth daily. 03/05/20   Susy Frizzle, MD  latanoprost (XALATAN) 0.005 % ophthalmic solution Place 1 drop into the left eye at bedtime.  12/05/17   [provider]  lisinopril (ZESTRIL) 40 MG tablet TAKE 1 TABLET BY MOUTH  DAILY 06/07/21   Susy Frizzle, MD  magnesium 30 MG tablet Take 30 mg by mouth 2 (two) times daily.    [provider]  melatonin 5 MG TABS Take 5 mg by mouth at bedtime.    [provider]  Multiple Vitamin (MULTIVITAMIN) tablet Take 1 tablet by mouth daily.    [provider]  nitrofurantoin, macrocrystal-monohydrate, (MACROBID) 100 MG capsule Take 1 capsule (100 mg total) by mouth 2 (two) times daily for 7 days. 07/22/21 07/29/21  Idamae Lusher, MD  pantoprazole ([place])  40 MG tablet Take 40 mg by mouth daily.    [provider]  potassium chloride (KLOR-CON) 10 MEQ tablet TAKE 1 TABLET BY MOUTH  DAILY 11/11/20   Susy Frizzle, MD  pravastatin (PRAVACHOL) 40 MG tablet TAKE 1 TABLET BY MOUTH  DAILY 12/30/20   Susy Frizzle, MD  SYMBICORT 160-4.5 MCG/ACT inhaler USE 2 INHALATIONS BY MOUTH  TWICE DAILY 03/04/20   Susy Frizzle, MD  traZODone (DESYREL) 50 MG tablet TAKE 1 TABLET BY MOUTH AT  BEDTIME 01/28/21   Susy Frizzle, MD  Turmeric 500 MG CAPS Take 500 mg by mouth daily.     [provider]  venlafaxine XR (EFFEXOR-XR) 75 MG 24 hr capsule TAKE 1 CAPSULE BY MOUTH  DAILY WITH BREAKFAST 04/09/21   Susy Frizzle, MD  verapamil (CALAN-SR) 240 MG CR tablet TAKE 1 TABLET BY MOUTH AT  BEDTIME 07/05/21   Susy Frizzle, MD  vitamin E 180 MG (400 UNITS) capsule Take 180 mg by mouth daily.    [provider]    Allergies    Oxycodone  Review of Systems   Review of Systems  Constitutional:  Negative for chills and fever.  HENT:  Negative for ear pain and sore throat.   Eyes:  Negative for pain and visual disturbance.  Respiratory:  Negative for cough and shortness of breath.   Cardiovascular:  Negative for chest pain and palpitations.  Gastrointestinal:  Negative for abdominal pain and vomiting.  Genitourinary:  Negative for dysuria and hematuria.  Musculoskeletal:  Negative for arthralgias and back pain.       R hip pain, R shoulder pain  Skin:  Negative for color change and rash.  Neurological:  Positive for headaches. Negative for seizures and syncope.  All other systems reviewed and are negative.  Physical Exam Updated Vital Signs BP 134/70   Pulse 70   Temp 98 F (36.7 C) (Oral)   Resp 16    Ht 5\' 5"  (1.651 m)   Wt 109.8 kg   SpO2 97%   BMI 40.28 kg/m   Physical Exam Vitals and nursing note reviewed.  Constitutional:      General: She is not in acute distress.    Appearance: Normal appearance. She is well-developed.  HENT:     Head: Normocephalic.     Comments: Faint ecchymosis to right forehead    Right Ear: External ear normal.     Left Ear: External ear normal.     Nose: Nose normal. No congestion or rhinorrhea.     Mouth/Throat:     Mouth: Mucous membranes are moist.  Eyes:     Extraocular Movements: Extraocular movements intact.     Conjunctiva/sclera: Conjunctivae normal.     Pupils: Pupils are equal, round, and reactive to light.  Cardiovascular:     Rate and Rhythm: Regular rhythm.     Pulses: Normal pulses.     Heart sounds: No murmur heard. Pulmonary:     Effort: Pulmonary effort is normal. No respiratory distress.     Breath sounds: Normal breath sounds. No wheezing, rhonchi or rales.  Abdominal:     General: Abdomen is flat. Bowel sounds are normal.     Palpations: Abdomen is soft.     Tenderness: There is no abdominal tenderness. There is no guarding or rebound.  Musculoskeletal:        General: Tenderness (Tenderness palpation of the right anterior hip with painful range of motion.  She is  neurovascular intact distally.  She does have tenderness palpation of the right lateral shoulder with no obvious deformity.  No overlying wounds or abrasions. NVID) present. No swelling or deformity.     Cervical back: Normal range of motion and neck supple. No rigidity.  Skin:    General: Skin is warm and dry.     Capillary Refill: Capillary refill takes less than 2 seconds.  Neurological:     General: No focal deficit present.     Mental Status: She is alert.     Cranial Nerves: No cranial nerve deficit.     Sensory: No sensory deficit.     Motor: No weakness.     Coordination: Coordination normal.     Gait: Gait normal.     Comments: Oriented to self and  place.  Psychiatric:        Mood and Affect: Mood normal.    ED Results / Procedures / Treatments   Labs (all labs ordered are listed, but only abnormal results are displayed) Labs Reviewed  COMPREHENSIVE METABOLIC PANEL - Abnormal; Notable for the following components:      Result Value   Glucose, Bld 145 (*)    Creatinine, Ser 1.17 (*)    GFR, Estimated 47 (*)    All other components within normal limits  URINALYSIS, ROUTINE W REFLEX MICROSCOPIC - Abnormal; Notable for the following components:   APPearance HAZY (*)    Specific Gravity, Urine >1.030 (*)    Protein, ur 30 (*)    Nitrite POSITIVE (*)    Leukocytes,Ua SMALL (*)    All other components within normal limits  URINALYSIS, MICROSCOPIC (REFLEX) - Abnormal; Notable for the following components:   Bacteria, UA MANY (*)    All other components within normal limits  I-STAT CHEM 8, ED - Abnormal; Notable for the following components:   Creatinine, Ser 1.10 (*)    Glucose, Bld 144 (*)    All other components within normal limits  I-STAT VENOUS BLOOD GAS, ED - Abnormal; Notable for the following components:   pCO2, Ven 36.1 (*)    pO2, Ven 58.0 (*)    All other components within normal limits  CBC WITH DIFFERENTIAL/PLATELET  AMMONIA  RAPID URINE DRUG SCREEN, HOSP PERFORMED  ETHANOL  APTT  PROTIME-INR  CBG MONITORING, ED    EKG None  Radiology DG Chest 1 View  Result Date: 07/22/2021 CLINICAL DATA:  Per triage notes:" Pt arrives from home for c/o falling off of the tiolet today. Down 1 hour after pressing her medical alarm. C/o right shoulder pain. Right head hematoma present on arrival. EXAM: CHEST  1 VIEW COMPARISON:  09/27/2019 FINDINGS: Lungs are clear. Heart size and mediastinal contours are within normal limits. No effusion.  No pneumothorax. Visualized bones unremarkable. IMPRESSION: No acute cardiopulmonary disease. Electronically Signed   By: Lucrezia Europe M.D.   On: 07/22/2021 18:30   CT HEAD WO  CONTRAST  Result Date: 07/22/2021 CLINICAL DATA:  Head trauma, minor (Age >= 65y); Neck trauma (Age >= 65y). Fall from toilet. Right head hematoma EXAM: CT HEAD WITHOUT CONTRAST CT CERVICAL SPINE WITHOUT CONTRAST TECHNIQUE: Multidetector CT imaging of the head and cervical spine was performed following the standard protocol without intravenous contrast. Multiplanar CT image reconstructions of the cervical spine were also generated. COMPARISON:  CT head 10/25/2016 FINDINGS: CT HEAD FINDINGS BRAIN: BRAIN Cerebral ventricle sizes are concordant with the degree of cerebral volume loss. Patchy and confluent areas of decreased attenuation are noted throughout  the deep and periventricular white matter of the cerebral hemispheres bilaterally, compatible with chronic microvascular ischemic disease. No evidence of large-territorial acute infarction. No parenchymal hemorrhage. 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 Otherwise no mass lesion. No extra-axial collection. No mass effect or midline shift. No hydrocephalus. Basilar cisterns are patent. Vascular: No hyperdense vessel. Atherosclerotic calcifications are present within the cavernous internal carotid arteries. Skull: No acute fracture or focal lesion. Sinuses/Orbits: Right maxillary mucosal thickening. Polypoid mucosal retention cyst within the left maxillary sinus. Mucosal thickening of the right ethmoid sinus. Paranasal sinuses and mastoid air cells are clear. The orbits are unremarkable. Other: None. CT CERVICAL SPINE FINDINGS Alignment: Normal. Skull base and vertebrae: Multilevel severe degenerative changes spine with associated multilevel severe osseous neural foraminal stenosis. No severe osseous central canal stenosis. No acute fracture. No aggressive appearing focal osseous lesion or focal pathologic process. Soft tissues and spinal canal: No prevertebral fluid or swelling. No visible canal hematoma. Upper chest:  Unremarkable. Other: None. IMPRESSION: 1. No acute intracranial abnormality. 2. 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 3. No acute displaced fracture or traumatic listhesis of the cervical spine. 4. Multilevel severe degenerative changes spine with associated multilevel severe osseous neural foraminal stenosis. Electronically Signed   By: Iven Finn M.D.   On: 07/22/2021 18:18   CT CERVICAL SPINE WO CONTRAST  Result Date: 07/22/2021 CLINICAL DATA:  Head trauma, minor (Age >= 65y); Neck trauma (Age >= 65y). Fall from toilet. Right head hematoma EXAM: CT HEAD WITHOUT CONTRAST CT CERVICAL SPINE WITHOUT CONTRAST TECHNIQUE: Multidetector CT imaging of the head and cervical spine was performed following the standard protocol without intravenous contrast. Multiplanar CT image reconstructions of the cervical spine were also generated. COMPARISON:  CT head 10/25/2016 FINDINGS: CT HEAD FINDINGS BRAIN: BRAIN Cerebral ventricle sizes are concordant with the degree of cerebral volume loss. Patchy and confluent areas of decreased attenuation are noted throughout the deep and periventricular white matter of the cerebral hemispheres bilaterally, compatible with chronic microvascular ischemic disease. No evidence of large-territorial acute infarction. No parenchymal hemorrhage. 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 Otherwise no mass lesion. No extra-axial collection. No mass effect or midline shift. No hydrocephalus. Basilar cisterns are patent. Vascular: No hyperdense vessel. Atherosclerotic calcifications are present within the cavernous internal carotid arteries. Skull: No acute fracture or focal lesion. Sinuses/Orbits: Right maxillary mucosal thickening. Polypoid mucosal retention cyst within the left maxillary sinus. Mucosal thickening of the right ethmoid sinus. Paranasal sinuses and mastoid air cells are clear. The orbits  are unremarkable. Other: None. CT CERVICAL SPINE FINDINGS Alignment: Normal. Skull base and vertebrae: Multilevel severe degenerative changes spine with associated multilevel severe osseous neural foraminal stenosis. No severe osseous central canal stenosis. No acute fracture. No aggressive appearing focal osseous lesion or focal pathologic process. Soft tissues and spinal canal: No prevertebral fluid or swelling. No visible canal hematoma. Upper chest: Unremarkable. Other: None. IMPRESSION: 1. No acute intracranial abnormality. 2. 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 3. No acute displaced fracture or traumatic listhesis of the cervical spine. 4. Multilevel severe degenerative changes spine with associated multilevel severe osseous neural foraminal stenosis. Electronically Signed   By: Iven Finn M.D.   On: 07/22/2021 18:18   DG Hip Unilat W or Wo Pelvis 2-3 Views Right  Result Date: 07/22/2021 CLINICAL DATA:  Fell EXAM: DG HIP (WITH OR WITHOUT PELVIS) 2-3V RIGHT COMPARISON:  10/25/2016 FINDINGS: No fracture or dislocation. Patchy femoral arterial calcifications. Mild spondylitic changes in the visualized lower lumbar spine. Bilateral pelvic phleboliths. IMPRESSION: No acute findings Electronically Signed   By: Lucrezia Europe M.D.   On: 07/22/2021 18:30    Procedures Procedures   Medications Ordered in ED Medications  cefTRIAXone (ROCEPHIN) injection 1 g (1 g Intramuscular Given 07/22/21 2055)  lidocaine (PF) (XYLOCAINE) 1 % injection (  Given 07/22/21 2056)    ED Course  I have reviewed the triage vital signs and the nursing notes.  Pertinent labs & imaging results that were available during my care of the patient were reviewed by me and considered in my medical decision making (see chart for details).    MDM Rules/Calculators/A&P                          80 year old female with a history of CKD, CHF, hypertension, COPD presenting with fall.   She was found down at home after she had a fall off her toilet.  She describes a mechanical fall. She does have ecchymosis to her right forehead and is complaining of right shoulder and right hip pain.  She is afebrile and hemodynamically stable on arrival.  ABCs intact.  She has no chest wall or abdominal ecchymosis or tenderness.  Focused trauma imaging obtained.  CT head and C-spine showed no acute traumatic injuries.  Chest and pelvis imaging showed no acute fractures or dislocations.  Patient has had some questionable confusion per family. Toxic metabolic work-up performed.  UA concerning for acute infection..  She is not tachycardic or febrile.  Low concern for systemic infection.  No meningeal signs on exam.  Tox screen normal. Electrolytes normal.  Creatinine at baseline.  Discussed findings with patient.  C-spine cleared.   She is able to stand and ambulate without difficulty. Patient requesting discharge home.  She was given a dose of IM Rocephin and sent home with a prescription for Macrobid.  Encourage close follow-up with PCP in 3 to 5 days for reassessment.  Strict return ED precautions provided.  Final Clinical Impression(s) / ED Diagnoses Final diagnoses:  Injury of head, initial encounter  Acute cystitis without hematuria    Rx / DC Orders ED Discharge Orders          Ordered    nitrofurantoin, macrocrystal-monohydrate, (MACROBID) 100 MG capsule  2 times daily,   Status:  Discontinued        07/22/21 2031    nitrofurantoin, macrocrystal-monohydrate, (MACROBID) 100 MG capsule  2 times daily        07/22/21 2031             Idamae Lusher, MD 07/22/21 2324    Carmin Muskrat, MD 07/23/21 [phone]

## 2021-07-27 ENCOUNTER — Other Ambulatory Visit: Payer: Self-pay

## 2021-07-27 ENCOUNTER — Ambulatory Visit (INDEPENDENT_AMBULATORY_CARE_PROVIDER_SITE_OTHER): Payer: Medicare Other | Admitting: Family Medicine

## 2021-07-27 VITALS — BP 116/62 | HR 76 | Temp 97.5°F | Ht 65.0 in | Wt 236.0 lb

## 2021-07-27 DIAGNOSIS — N3000 Acute cystitis without hematuria: Secondary | ICD-10-CM

## 2021-07-27 DIAGNOSIS — R531 Weakness: Secondary | ICD-10-CM | POA: Diagnosis not present

## 2021-07-27 DIAGNOSIS — M48061 Spinal stenosis, lumbar region without neurogenic claudication: Secondary | ICD-10-CM | POA: Diagnosis not present

## 2021-07-27 DIAGNOSIS — M5431 Sciatica, right side: Secondary | ICD-10-CM

## 2021-07-27 MED ORDER — NITROFURANTOIN MONOHYD MACRO 100 MG PO CAPS: 100.0000 mg | ORAL_CAPSULE | Freq: Two times a day (BID) | ORAL | 0 refills | Status: AC

## 2021-07-27 NOTE — Progress Notes (Signed)
Subjective:    Patient ID: Virginia Walters, female    DOB: 05-Feb-1941, 80 y.o.   MRN: [id number]   Patient was recently seen in the ER on December 8 after she fell off of her toilet.  She was using the bathroom.  She believes that she lost consciousness.  He remembers becoming lightheaded and starting to lurch forward.  She believes that she may have lost consciousness for a few minutes.  She fell and hit the back of her head on the floor.  She was taken to the emergency room where a CT scan of her head and neck were negative for any acute injuries although it did show chronic microvascular ischemic changes and cerebral atrophy and severe degenerative disc disease.  Lab work was significant for up urinary tract infection.  She was started on nitrofurantoin.  Both the patient and her husband state that she is doing much better.  They state that in the weeks prior she had become lethargic and more confused.  She stopped eating.  She was experiencing polyuria and frequency.  Now just a few days on the antibiotics, she is back to her baseline.  Her mentation has returned to her baseline.  She does have some mild age-related cognitive decline and I believe perhaps some mild underlying vascular dementia but she is now back to her baseline.  She is still very weak and her family member feels that she would benefit from home health physical therapy.  The patient is unable to drive and lives alone.  The patient also continues to report severe pain in her lower back that radiates into right posterior hips.  She has pain with standing and weakness with standing and walking.  An MRI in 2017 showed moderate to severe foraminal stenosis in the right neural foramen of L4 and L5.  Past Medical History:  Diagnosis Date   CHF (congestive heart failure) (HCC)    Dyslipidemia    Frequent falls    GERD (gastroesophageal reflux disease)    HTN (hypertension)    Memory loss    PFO (patent foramen ovale)    Retinal  artery occlusion    Weakness    Past Surgical History:  Procedure Laterality Date   ANKLE ARTHROSCOPY WITH OPEN REDUCTION INTERNAL FIXATION (ORIF) Right    APPENDECTOMY     COLONOSCOPY N/A 07/21/2018   Procedure: COLONOSCOPY;  Surgeon: Lin Landsman, MD;  Location: [facility];  Service: Gastroenterology;  Laterality: N/A;   COLONOSCOPY WITH PROPOFOL N/A 04/01/2020   Procedure: COLONOSCOPY WITH PROPOFOL;  Surgeon: Virgel Manifold, MD;  Location: [facility] ENDOSCOPY;  Service: Endoscopy;  Laterality: N/A;   ESOPHAGOGASTRODUODENOSCOPY N/A 07/21/2018   Procedure: ESOPHAGOGASTRODUODENOSCOPY (EGD);  Surgeon: Lin Landsman, MD;  Location: [facility] ENDOSCOPY;  Service: Gastroenterology;  Laterality: N/A;   ESOPHAGOGASTRODUODENOSCOPY N/A 02/11/2020   Procedure: ESOPHAGOGASTRODUODENOSCOPY (EGD);  Surgeon: Virgel Manifold, MD;  Location: [facility] ENDOSCOPY;  Service: Endoscopy;  Laterality: N/A;   ESOPHAGOGASTRODUODENOSCOPY (EGD) WITH PROPOFOL N/A 04/01/2020   Procedure: ESOPHAGOGASTRODUODENOSCOPY (EGD) WITH PROPOFOL;  Surgeon: Virgel Manifold, MD;  Location: [facility] ENDOSCOPY;  Service: Endoscopy;  Laterality: N/A;   TUBAL LIGATION     Current Outpatient Medications on File Prior to Visit  Medication Sig Dispense Refill   B Complex-Biotin-FA (B-COMPLEX PO) Take 1 capsule by mouth daily.      Calcium Carbonate-Vitamin D (CALCIUM + D PO) Take 1 capsule by mouth 2 (two) times daily.     Cholecalciferol (VITAMIN D) 2000 UNITS  CAPS Take 1 capsule by mouth daily.     dorzolamide (TRUSOPT) 2 % ophthalmic solution Place 1 drop into the left eye 2 (two) times daily.     doxazosin (CARDURA) 4 MG tablet TAKE ONE-HALF TABLET BY  MOUTH DAILY 45 tablet 3   ferrous sulfate 325 (65 FE) MG EC tablet Take 1 tablet (325 mg total) by mouth 2 (two) times daily. 60 tablet 11   fish oil-omega-3 fatty acids 1000 MG capsule Take 2 g by mouth 2 (two) times daily.     hydrochlorothiazide (HYDRODIURIL) 25 MG tablet  Take 1 tablet (25 mg total) by mouth daily. 90 tablet 3   latanoprost (XALATAN) 0.005 % ophthalmic solution Place 1 drop into the left eye at bedtime.      lisinopril (ZESTRIL) 40 MG tablet TAKE 1 TABLET BY MOUTH  DAILY 90 tablet 3   magnesium 30 MG tablet Take 30 mg by mouth 2 (two) times daily.     melatonin 5 MG TABS Take 5 mg by mouth at bedtime.     Multiple Vitamin (MULTIVITAMIN) tablet Take 1 tablet by mouth daily.     pantoprazole (PROTONIX) 40 MG tablet Take 40 mg by mouth daily.     potassium chloride (KLOR-CON) 10 MEQ tablet TAKE 1 TABLET BY MOUTH  DAILY 90 tablet 3   pravastatin (PRAVACHOL) 40 MG tablet TAKE 1 TABLET BY MOUTH  DAILY 90 tablet 3   SYMBICORT 160-4.5 MCG/ACT inhaler USE 2 INHALATIONS BY MOUTH  TWICE DAILY 30.6 g 3   traZODone (DESYREL) 50 MG tablet TAKE 1 TABLET BY MOUTH AT  BEDTIME 90 tablet 3   Turmeric 500 MG CAPS Take 500 mg by mouth daily.      venlafaxine XR (EFFEXOR-XR) 75 MG 24 hr capsule TAKE 1 CAPSULE BY MOUTH  DAILY WITH BREAKFAST 90 capsule 3   verapamil (CALAN-SR) 240 MG CR tablet TAKE 1 TABLET BY MOUTH AT  BEDTIME 90 tablet 3   vitamin E 180 MG (400 UNITS) capsule Take 180 mg by mouth daily.     No current facility-administered medications on file prior to visit.   Allergies  Allergen Reactions   Oxycodone Itching   Social History   Socioeconomic History   Marital status: Widowed    Spouse name: Not on file   Number of children: 1   Years of education: HS   Highest education level: Not on file  Occupational History   Occupation: Retired  Tobacco Use   Smoking status: Former    Packs/day: 1.00    Years: 15.00    Pack years: 15.00    Types: Cigarettes    Quit date: 12/27/1980    Years since quitting: 40.6   Smokeless tobacco: Never  Vaping Use   Vaping Use: Never used  Substance and Sexual Activity   Alcohol use: Yes    Comment: occassionally   Drug use: No   Sexual activity: Not Currently  Other Topics Concern   Not on file   Social History Narrative   Lives at home alone. Husband passed away 11-13-12.   Right-handed.   No daily use of caffeine.   Daughter lives in [place].   Social Determinants of Health   Financial Resource Strain: Low Risk    Difficulty of Paying Living Expenses: Not hard at all  Food Insecurity: No Food Insecurity   Worried About Charity fundraiser in the Last Year: Never true   [place] in the Last Year: Never  true  Transportation Needs: No Transportation Needs   Lack of Transportation (Medical): No   Lack of Transportation (Non-Medical): No  Physical Activity: Inactive   Days of Exercise per Week: 0 days   Minutes of Exercise per Session: 0 min  Stress: No Stress Concern Present   Feeling of Stress : Not at all  Social Connections: Moderately Integrated   Frequency of Communication with Friends and Family: Three times a week   Frequency of Social Gatherings with Friends and Family: Three times a week   Attends Religious Services: More than 4 times per year   Active Member of Clubs or Organizations: Yes   Attends Archivist Meetings: More than 4 times per year   Marital Status: Widowed  Human resources officer Violence: Not At Risk   Fear of Current or Ex-Partner: No   Emotionally Abused: No   Physically Abused: No   Sexually Abused: No     Review of Systems  Respiratory:  Positive for shortness of breath.   Musculoskeletal:  Positive for back pain.  All other systems reviewed and are negative.     Objective:   Physical Exam Constitutional:      General: She is not in acute distress.    Appearance: She is not ill-appearing, toxic-appearing or diaphoretic.  HENT:     Mouth/Throat:     Mouth: Mucous membranes are moist.  Neck:     Vascular: No carotid bruit.  Cardiovascular:     Rate and Rhythm: Normal rate and regular rhythm.     Heart sounds: Normal heart sounds. No murmur heard. Pulmonary:     Effort: Pulmonary effort is normal. No respiratory  distress.     Breath sounds: No stridor. No wheezing, rhonchi or rales.  Chest:     Chest wall: No tenderness.  Musculoskeletal:     Lumbar back: Spasms present. Decreased range of motion. Positive right straight leg raise test.       Back:     Right lower leg: No edema.     Left lower leg: No edema.  Neurological:     General: No focal deficit present.     Mental Status: She is alert. Mental status is at baseline.     Sensory: Sensation is intact.     Coordination: Coordination is intact.     Gait: Gait is intact.          Assessment & Plan:  Acute cystitis without hematuria - Plan: CBC with Differential/Platelet, COMPLETE METABOLIC PANEL WITH GFR, Urinalysis, Routine w reflex microscopic  Weakness - Plan: Ambulatory referral to [facility]  Right sided sciatica - Plan: MR Lumbar Spine Wo Contrast  Foraminal stenosis of lumbar region - Plan: MR Lumbar Spine Wo Contrast  Patient appears to have had delirium secondary to a urinary tract infection.  She is now back to her baseline at.  I will consult home health physical therapy to try to improve her weakness and deconditioning.  She continues to have chronic low back pain radiating into her right posterior hip and down her right leg.  An MRI in 2017 showed foraminal stenosis.  She states the pain is getting worse and she would be interested in a cortisone injection if it would help her pain.  Therefore I will proceed with a repeat MRI of the lumbar spine to determine the best location for epidural steroid injections.

## 2021-07-28 LAB — COMPLETE METABOLIC PANEL WITH GFR
AG Ratio: 1.3 (calc) (ref 1.0–2.5)
ALT: 36 U/L — ABNORMAL HIGH (ref 6–29)
AST: 32 U/L (ref 10–35)
Albumin: 3.8 g/dL (ref 3.6–5.1)
Alkaline phosphatase (APISO): 68 U/L (ref 37–153)
BUN/Creatinine Ratio: 17 (calc) (ref 6–22)
BUN: 18 mg/dL (ref 7–25)
CO2: 23 mmol/L (ref 20–32)
Calcium: 9.4 mg/dL (ref 8.6–10.4)
Chloride: 106 mmol/L (ref 98–110)
Creat: 1.07 mg/dL — ABNORMAL HIGH (ref 0.60–0.95)
Globulin: 2.9 g/dL (calc) (ref 1.9–3.7)
Glucose, Bld: 102 mg/dL — ABNORMAL HIGH (ref 65–99)
Potassium: 4.2 mmol/L (ref 3.5–5.3)
Sodium: 141 mmol/L (ref 135–146)
Total Bilirubin: 0.4 mg/dL (ref 0.2–1.2)
Total Protein: 6.7 g/dL (ref 6.1–8.1)
eGFR: 53 mL/min/{1.73_m2} — ABNORMAL LOW (ref >=60)

## 2021-07-28 LAB — CBC WITH DIFFERENTIAL/PLATELET
Absolute Monocytes: 670 cells/uL (ref 200–950)
Basophils Absolute: 40 cells/uL (ref 0–200)
Basophils Relative: 0.6 %
Eosinophils Absolute: 47 cells/uL (ref 15–500)
Eosinophils Relative: 0.7 %
HCT: 44.2 % (ref 35.0–45.0)
Hemoglobin: 14.9 g/dL (ref 11.7–15.5)
Lymphs Abs: 1333 cells/uL (ref 850–3900)
MCH: 31.6 pg (ref 27.0–33.0)
MCHC: 33.7 g/dL (ref 32.0–36.0)
MCV: 93.6 fL (ref 80.0–100.0)
MPV: 10.3 fL (ref 7.5–12.5)
Monocytes Relative: 10 %
Neutro Abs: 4610 cells/uL (ref 1500–7800)
Neutrophils Relative %: 68.8 %
Platelets: 252 10*3/uL (ref 140–400)
RBC: 4.72 10*6/uL (ref 3.80–5.10)
RDW: 11.4 % (ref 11.0–15.0)
Total Lymphocyte: 19.9 %
WBC: 6.7 10*3/uL (ref 3.8–10.8)

## 2021-07-29 ENCOUNTER — Telehealth: Payer: Self-pay

## 2021-07-29 DIAGNOSIS — J449 Chronic obstructive pulmonary disease, unspecified: Secondary | ICD-10-CM | POA: Diagnosis not present

## 2021-07-29 NOTE — Telephone Encounter (Signed)
-----   Message from Susy Frizzle, MD sent at 07/29/2021  7:00 AM EST ----- Labs look excellent

## 2021-07-29 NOTE — Telephone Encounter (Signed)
Left message for patient to call back regarding results.

## 2021-08-02 DIAGNOSIS — Z7951 Long term (current) use of inhaled steroids: Secondary | ICD-10-CM | POA: Diagnosis not present

## 2021-08-02 DIAGNOSIS — H34239 Retinal artery branch occlusion, unspecified eye: Secondary | ICD-10-CM | POA: Diagnosis not present

## 2021-08-02 DIAGNOSIS — M5136 Other intervertebral disc degeneration, lumbar region: Secondary | ICD-10-CM | POA: Diagnosis not present

## 2021-08-02 DIAGNOSIS — W1811XD Fall from or off toilet without subsequent striking against object, subsequent encounter: Secondary | ICD-10-CM | POA: Diagnosis not present

## 2021-08-02 DIAGNOSIS — E785 Hyperlipidemia, unspecified: Secondary | ICD-10-CM | POA: Diagnosis not present

## 2021-08-02 DIAGNOSIS — K219 Gastro-esophageal reflux disease without esophagitis: Secondary | ICD-10-CM | POA: Diagnosis not present

## 2021-08-02 DIAGNOSIS — M5431 Sciatica, right side: Secondary | ICD-10-CM | POA: Diagnosis not present

## 2021-08-02 DIAGNOSIS — M9963 Osseous and subluxation stenosis of intervertebral foramina of lumbar region: Secondary | ICD-10-CM | POA: Diagnosis not present

## 2021-08-02 DIAGNOSIS — N3000 Acute cystitis without hematuria: Secondary | ICD-10-CM | POA: Diagnosis not present

## 2021-08-02 DIAGNOSIS — G319 Degenerative disease of nervous system, unspecified: Secondary | ICD-10-CM | POA: Diagnosis not present

## 2021-08-02 DIAGNOSIS — Q2112 Patent foramen ovale: Secondary | ICD-10-CM | POA: Diagnosis not present

## 2021-08-02 DIAGNOSIS — I110 Hypertensive heart disease with heart failure: Secondary | ICD-10-CM | POA: Diagnosis not present

## 2021-08-02 DIAGNOSIS — I509 Heart failure, unspecified: Secondary | ICD-10-CM | POA: Diagnosis not present

## 2021-08-03 ENCOUNTER — Telehealth: Payer: Self-pay

## 2021-08-03 DIAGNOSIS — H401122 Primary open-angle glaucoma, left eye, moderate stage: Secondary | ICD-10-CM | POA: Diagnosis not present

## 2021-08-03 DIAGNOSIS — H401113 Primary open-angle glaucoma, right eye, severe stage: Secondary | ICD-10-CM | POA: Diagnosis not present

## 2021-08-03 DIAGNOSIS — H35362 Drusen (degenerative) of macula, left eye: Secondary | ICD-10-CM | POA: Diagnosis not present

## 2021-08-03 DIAGNOSIS — H25813 Combined forms of age-related cataract, bilateral: Secondary | ICD-10-CM | POA: Diagnosis not present

## 2021-08-03 NOTE — Telephone Encounter (Signed)
Virginia Walters w/[facility] called to get approval for verbal orders for PT 1xw/6w  Verbal approval given. Nothing further needed at this time.

## 2021-08-10 DIAGNOSIS — W1811XD Fall from or off toilet without subsequent striking against object, subsequent encounter: Secondary | ICD-10-CM | POA: Diagnosis not present

## 2021-08-10 DIAGNOSIS — Q2112 Patent foramen ovale: Secondary | ICD-10-CM | POA: Diagnosis not present

## 2021-08-10 DIAGNOSIS — N3000 Acute cystitis without hematuria: Secondary | ICD-10-CM | POA: Diagnosis not present

## 2021-08-10 DIAGNOSIS — H34239 Retinal artery branch occlusion, unspecified eye: Secondary | ICD-10-CM | POA: Diagnosis not present

## 2021-08-10 DIAGNOSIS — G319 Degenerative disease of nervous system, unspecified: Secondary | ICD-10-CM | POA: Diagnosis not present

## 2021-08-10 DIAGNOSIS — K219 Gastro-esophageal reflux disease without esophagitis: Secondary | ICD-10-CM | POA: Diagnosis not present

## 2021-08-10 DIAGNOSIS — I509 Heart failure, unspecified: Secondary | ICD-10-CM | POA: Diagnosis not present

## 2021-08-10 DIAGNOSIS — Z7951 Long term (current) use of inhaled steroids: Secondary | ICD-10-CM | POA: Diagnosis not present

## 2021-08-10 DIAGNOSIS — M5431 Sciatica, right side: Secondary | ICD-10-CM | POA: Diagnosis not present

## 2021-08-10 DIAGNOSIS — M9963 Osseous and subluxation stenosis of intervertebral foramina of lumbar region: Secondary | ICD-10-CM | POA: Diagnosis not present

## 2021-08-10 DIAGNOSIS — I110 Hypertensive heart disease with heart failure: Secondary | ICD-10-CM | POA: Diagnosis not present

## 2021-08-10 DIAGNOSIS — E785 Hyperlipidemia, unspecified: Secondary | ICD-10-CM | POA: Diagnosis not present

## 2021-08-10 DIAGNOSIS — M5136 Other intervertebral disc degeneration, lumbar region: Secondary | ICD-10-CM | POA: Diagnosis not present

## 2021-08-11 ENCOUNTER — Encounter: Payer: Self-pay | Admitting: Family Medicine

## 2021-08-17 DIAGNOSIS — W1811XD Fall from or off toilet without subsequent striking against object, subsequent encounter: Secondary | ICD-10-CM | POA: Diagnosis not present

## 2021-08-17 DIAGNOSIS — K219 Gastro-esophageal reflux disease without esophagitis: Secondary | ICD-10-CM | POA: Diagnosis not present

## 2021-08-17 DIAGNOSIS — M5136 Other intervertebral disc degeneration, lumbar region: Secondary | ICD-10-CM | POA: Diagnosis not present

## 2021-08-17 DIAGNOSIS — Z7951 Long term (current) use of inhaled steroids: Secondary | ICD-10-CM | POA: Diagnosis not present

## 2021-08-17 DIAGNOSIS — M9963 Osseous and subluxation stenosis of intervertebral foramina of lumbar region: Secondary | ICD-10-CM | POA: Diagnosis not present

## 2021-08-17 DIAGNOSIS — M5431 Sciatica, right side: Secondary | ICD-10-CM | POA: Diagnosis not present

## 2021-08-17 DIAGNOSIS — Q2112 Patent foramen ovale: Secondary | ICD-10-CM | POA: Diagnosis not present

## 2021-08-17 DIAGNOSIS — E785 Hyperlipidemia, unspecified: Secondary | ICD-10-CM | POA: Diagnosis not present

## 2021-08-17 DIAGNOSIS — N3000 Acute cystitis without hematuria: Secondary | ICD-10-CM | POA: Diagnosis not present

## 2021-08-17 DIAGNOSIS — I509 Heart failure, unspecified: Secondary | ICD-10-CM | POA: Diagnosis not present

## 2021-08-17 DIAGNOSIS — H34239 Retinal artery branch occlusion, unspecified eye: Secondary | ICD-10-CM | POA: Diagnosis not present

## 2021-08-17 DIAGNOSIS — I110 Hypertensive heart disease with heart failure: Secondary | ICD-10-CM | POA: Diagnosis not present

## 2021-08-17 DIAGNOSIS — G319 Degenerative disease of nervous system, unspecified: Secondary | ICD-10-CM | POA: Diagnosis not present

## 2021-08-19 DIAGNOSIS — M9963 Osseous and subluxation stenosis of intervertebral foramina of lumbar region: Secondary | ICD-10-CM | POA: Diagnosis not present

## 2021-08-19 DIAGNOSIS — I10 Essential (primary) hypertension: Secondary | ICD-10-CM | POA: Diagnosis not present

## 2021-08-19 DIAGNOSIS — I509 Heart failure, unspecified: Secondary | ICD-10-CM | POA: Diagnosis not present

## 2021-08-19 DIAGNOSIS — N3000 Acute cystitis without hematuria: Secondary | ICD-10-CM | POA: Diagnosis not present

## 2021-08-19 DIAGNOSIS — M5136 Other intervertebral disc degeneration, lumbar region: Secondary | ICD-10-CM | POA: Diagnosis not present

## 2021-08-19 DIAGNOSIS — G319 Degenerative disease of nervous system, unspecified: Secondary | ICD-10-CM | POA: Diagnosis not present

## 2021-08-19 DIAGNOSIS — M5431 Sciatica, right side: Secondary | ICD-10-CM | POA: Diagnosis not present

## 2021-08-19 DIAGNOSIS — W1811XD Fall from or off toilet without subsequent striking against object, subsequent encounter: Secondary | ICD-10-CM

## 2021-08-20 ENCOUNTER — Ambulatory Visit
Admission: RE | Admit: 2021-08-20 | Discharge: 2021-08-20 | Disposition: A | Discharge status: Home / Self Care | Payer: Medicare Other | Source: Ambulatory Visit | Attending: Family Medicine | Admitting: Family Medicine

## 2021-08-20 ENCOUNTER — Other Ambulatory Visit: Payer: Self-pay

## 2021-08-20 DIAGNOSIS — M48061 Spinal stenosis, lumbar region without neurogenic claudication: Secondary | ICD-10-CM | POA: Diagnosis not present

## 2021-08-20 DIAGNOSIS — M5431 Sciatica, right side: Secondary | ICD-10-CM

## 2021-08-20 DIAGNOSIS — M47816 Spondylosis without myelopathy or radiculopathy, lumbar region: Secondary | ICD-10-CM | POA: Diagnosis not present

## 2021-08-23 ENCOUNTER — Telehealth: Payer: Self-pay

## 2021-08-23 NOTE — Telephone Encounter (Signed)
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  Recommend referral to neurosurgeon to discuss treatment options including surgery.  Once patient is aware of the results, please place a referral to neurosurgery (not neurology)

## 2021-08-23 NOTE — Telephone Encounter (Signed)
Left message for patient to call back regarding results and recommendations. °

## 2021-08-27 DIAGNOSIS — Z7951 Long term (current) use of inhaled steroids: Secondary | ICD-10-CM | POA: Diagnosis not present

## 2021-08-27 DIAGNOSIS — M5136 Other intervertebral disc degeneration, lumbar region: Secondary | ICD-10-CM | POA: Diagnosis not present

## 2021-08-27 DIAGNOSIS — E785 Hyperlipidemia, unspecified: Secondary | ICD-10-CM | POA: Diagnosis not present

## 2021-08-27 DIAGNOSIS — M5431 Sciatica, right side: Secondary | ICD-10-CM | POA: Diagnosis not present

## 2021-08-27 DIAGNOSIS — N3000 Acute cystitis without hematuria: Secondary | ICD-10-CM | POA: Diagnosis not present

## 2021-08-27 DIAGNOSIS — G319 Degenerative disease of nervous system, unspecified: Secondary | ICD-10-CM | POA: Diagnosis not present

## 2021-08-27 DIAGNOSIS — K219 Gastro-esophageal reflux disease without esophagitis: Secondary | ICD-10-CM | POA: Diagnosis not present

## 2021-08-27 DIAGNOSIS — M9963 Osseous and subluxation stenosis of intervertebral foramina of lumbar region: Secondary | ICD-10-CM | POA: Diagnosis not present

## 2021-08-27 DIAGNOSIS — W1811XD Fall from or off toilet without subsequent striking against object, subsequent encounter: Secondary | ICD-10-CM | POA: Diagnosis not present

## 2021-08-27 DIAGNOSIS — I509 Heart failure, unspecified: Secondary | ICD-10-CM | POA: Diagnosis not present

## 2021-08-27 DIAGNOSIS — H34239 Retinal artery branch occlusion, unspecified eye: Secondary | ICD-10-CM | POA: Diagnosis not present

## 2021-08-27 DIAGNOSIS — I110 Hypertensive heart disease with heart failure: Secondary | ICD-10-CM | POA: Diagnosis not present

## 2021-08-27 DIAGNOSIS — Q2112 Patent foramen ovale: Secondary | ICD-10-CM | POA: Diagnosis not present

## 2021-08-29 DIAGNOSIS — J449 Chronic obstructive pulmonary disease, unspecified: Secondary | ICD-10-CM | POA: Diagnosis not present

## 2021-09-03 ENCOUNTER — Telehealth: Payer: Self-pay

## 2021-09-03 NOTE — Telephone Encounter (Signed)
Jenna w/[facility] called to report pt missed PT appt this week. She has been unable to reach the pt to reschedule.   FYI only, thanks!

## 2021-09-06 DIAGNOSIS — M5136 Other intervertebral disc degeneration, lumbar region: Secondary | ICD-10-CM | POA: Diagnosis not present

## 2021-09-06 DIAGNOSIS — I110 Hypertensive heart disease with heart failure: Secondary | ICD-10-CM | POA: Diagnosis not present

## 2021-09-06 DIAGNOSIS — H25813 Combined forms of age-related cataract, bilateral: Secondary | ICD-10-CM | POA: Diagnosis not present

## 2021-09-06 DIAGNOSIS — G319 Degenerative disease of nervous system, unspecified: Secondary | ICD-10-CM | POA: Diagnosis not present

## 2021-09-06 DIAGNOSIS — W1811XD Fall from or off toilet without subsequent striking against object, subsequent encounter: Secondary | ICD-10-CM | POA: Diagnosis not present

## 2021-09-06 DIAGNOSIS — K219 Gastro-esophageal reflux disease without esophagitis: Secondary | ICD-10-CM | POA: Diagnosis not present

## 2021-09-06 DIAGNOSIS — H25812 Combined forms of age-related cataract, left eye: Secondary | ICD-10-CM | POA: Diagnosis not present

## 2021-09-06 DIAGNOSIS — H34239 Retinal artery branch occlusion, unspecified eye: Secondary | ICD-10-CM | POA: Diagnosis not present

## 2021-09-06 DIAGNOSIS — N3000 Acute cystitis without hematuria: Secondary | ICD-10-CM | POA: Diagnosis not present

## 2021-09-06 DIAGNOSIS — M9963 Osseous and subluxation stenosis of intervertebral foramina of lumbar region: Secondary | ICD-10-CM | POA: Diagnosis not present

## 2021-09-06 DIAGNOSIS — I509 Heart failure, unspecified: Secondary | ICD-10-CM | POA: Diagnosis not present

## 2021-09-06 DIAGNOSIS — Q2112 Patent foramen ovale: Secondary | ICD-10-CM | POA: Diagnosis not present

## 2021-09-06 DIAGNOSIS — Z7951 Long term (current) use of inhaled steroids: Secondary | ICD-10-CM | POA: Diagnosis not present

## 2021-09-06 DIAGNOSIS — M5431 Sciatica, right side: Secondary | ICD-10-CM | POA: Diagnosis not present

## 2021-09-06 DIAGNOSIS — E785 Hyperlipidemia, unspecified: Secondary | ICD-10-CM | POA: Diagnosis not present

## 2021-09-16 ENCOUNTER — Ambulatory Visit (INDEPENDENT_AMBULATORY_CARE_PROVIDER_SITE_OTHER): Payer: Medicare Other | Admitting: Nurse Practitioner

## 2021-09-16 ENCOUNTER — Emergency Department (HOSPITAL_COMMUNITY): Payer: Medicare Other

## 2021-09-16 ENCOUNTER — Other Ambulatory Visit: Payer: Self-pay

## 2021-09-16 ENCOUNTER — Inpatient Hospital Stay (HOSPITAL_COMMUNITY)
Admission: EM | Admit: 2021-09-16 | Discharge: 2021-09-21 | DRG: 444 | Disposition: A | Discharge status: Skilled Nursing Facility | Payer: Medicare Other | Attending: Family Medicine | Admitting: Family Medicine

## 2021-09-16 ENCOUNTER — Encounter: Payer: Self-pay | Admitting: Nurse Practitioner

## 2021-09-16 ENCOUNTER — Encounter (HOSPITAL_COMMUNITY): Payer: Self-pay

## 2021-09-16 VITALS — BP 158/88 | HR 77 | Resp 30

## 2021-09-16 DIAGNOSIS — N1831 Chronic kidney disease, stage 3a: Secondary | ICD-10-CM

## 2021-09-16 DIAGNOSIS — I700 Atherosclerosis of aorta: Secondary | ICD-10-CM | POA: Diagnosis not present

## 2021-09-16 DIAGNOSIS — Z79890 Hormone replacement therapy (postmenopausal): Secondary | ICD-10-CM

## 2021-09-16 DIAGNOSIS — Z9049 Acquired absence of other specified parts of digestive tract: Secondary | ICD-10-CM

## 2021-09-16 DIAGNOSIS — K8000 Calculus of gallbladder with acute cholecystitis without obstruction: Principal | ICD-10-CM | POA: Diagnosis present

## 2021-09-16 DIAGNOSIS — E785 Hyperlipidemia, unspecified: Secondary | ICD-10-CM | POA: Diagnosis not present

## 2021-09-16 DIAGNOSIS — F03918 Unspecified dementia, unspecified severity, with other behavioral disturbance: Secondary | ICD-10-CM | POA: Diagnosis not present

## 2021-09-16 DIAGNOSIS — R32 Unspecified urinary incontinence: Secondary | ICD-10-CM | POA: Diagnosis present

## 2021-09-16 DIAGNOSIS — R1031 Right lower quadrant pain: Secondary | ICD-10-CM

## 2021-09-16 DIAGNOSIS — I5022 Chronic systolic (congestive) heart failure: Secondary | ICD-10-CM | POA: Diagnosis not present

## 2021-09-16 DIAGNOSIS — J441 Chronic obstructive pulmonary disease with (acute) exacerbation: Secondary | ICD-10-CM | POA: Diagnosis not present

## 2021-09-16 DIAGNOSIS — R296 Repeated falls: Secondary | ICD-10-CM | POA: Diagnosis not present

## 2021-09-16 DIAGNOSIS — Z800 Family history of malignant neoplasm of digestive organs: Secondary | ICD-10-CM

## 2021-09-16 DIAGNOSIS — K219 Gastro-esophageal reflux disease without esophagitis: Secondary | ICD-10-CM | POA: Diagnosis not present

## 2021-09-16 DIAGNOSIS — R6889 Other general symptoms and signs: Secondary | ICD-10-CM | POA: Diagnosis not present

## 2021-09-16 DIAGNOSIS — I5033 Acute on chronic diastolic (congestive) heart failure: Secondary | ICD-10-CM | POA: Diagnosis present

## 2021-09-16 DIAGNOSIS — R410 Disorientation, unspecified: Secondary | ICD-10-CM | POA: Diagnosis not present

## 2021-09-16 DIAGNOSIS — G4700 Insomnia, unspecified: Secondary | ICD-10-CM

## 2021-09-16 DIAGNOSIS — K810 Acute cholecystitis: Secondary | ICD-10-CM | POA: Diagnosis not present

## 2021-09-16 DIAGNOSIS — I130 Hypertensive heart and chronic kidney disease with heart failure and stage 1 through stage 4 chronic kidney disease, or unspecified chronic kidney disease: Secondary | ICD-10-CM | POA: Diagnosis not present

## 2021-09-16 DIAGNOSIS — M48061 Spinal stenosis, lumbar region without neurogenic claudication: Secondary | ICD-10-CM

## 2021-09-16 DIAGNOSIS — E669 Obesity, unspecified: Secondary | ICD-10-CM | POA: Diagnosis present

## 2021-09-16 DIAGNOSIS — R0682 Tachypnea, not elsewhere classified: Secondary | ICD-10-CM

## 2021-09-16 DIAGNOSIS — R1110 Vomiting, unspecified: Secondary | ICD-10-CM | POA: Diagnosis not present

## 2021-09-16 DIAGNOSIS — K760 Fatty (change of) liver, not elsewhere classified: Secondary | ICD-10-CM | POA: Diagnosis not present

## 2021-09-16 DIAGNOSIS — Q2112 Patent foramen ovale: Secondary | ICD-10-CM | POA: Diagnosis not present

## 2021-09-16 DIAGNOSIS — R5381 Other malaise: Secondary | ICD-10-CM | POA: Diagnosis not present

## 2021-09-16 DIAGNOSIS — J449 Chronic obstructive pulmonary disease, unspecified: Secondary | ICD-10-CM

## 2021-09-16 DIAGNOSIS — Z87891 Personal history of nicotine dependence: Secondary | ICD-10-CM | POA: Diagnosis not present

## 2021-09-16 DIAGNOSIS — M5431 Sciatica, right side: Secondary | ICD-10-CM

## 2021-09-16 DIAGNOSIS — Z743 Need for continuous supervision: Secondary | ICD-10-CM | POA: Diagnosis not present

## 2021-09-16 DIAGNOSIS — R202 Paresthesia of skin: Secondary | ICD-10-CM | POA: Diagnosis present

## 2021-09-16 DIAGNOSIS — J9621 Acute and chronic respiratory failure with hypoxia: Secondary | ICD-10-CM

## 2021-09-16 DIAGNOSIS — K819 Cholecystitis, unspecified: Secondary | ICD-10-CM

## 2021-09-16 DIAGNOSIS — Z6839 Body mass index (BMI) 39.0-39.9, adult: Secondary | ICD-10-CM

## 2021-09-16 DIAGNOSIS — I10 Essential (primary) hypertension: Secondary | ICD-10-CM | POA: Diagnosis not present

## 2021-09-16 DIAGNOSIS — R079 Chest pain, unspecified: Secondary | ICD-10-CM

## 2021-09-16 DIAGNOSIS — Z79899 Other long term (current) drug therapy: Secondary | ICD-10-CM

## 2021-09-16 DIAGNOSIS — J4489 Other specified chronic obstructive pulmonary disease: Secondary | ICD-10-CM | POA: Diagnosis present

## 2021-09-16 DIAGNOSIS — M549 Dorsalgia, unspecified: Secondary | ICD-10-CM | POA: Diagnosis not present

## 2021-09-16 DIAGNOSIS — Z20822 Contact with and (suspected) exposure to covid-19: Secondary | ICD-10-CM | POA: Diagnosis present

## 2021-09-16 DIAGNOSIS — R0602 Shortness of breath: Secondary | ICD-10-CM

## 2021-09-16 DIAGNOSIS — R109 Unspecified abdominal pain: Secondary | ICD-10-CM | POA: Diagnosis not present

## 2021-09-16 DIAGNOSIS — Z7401 Bed confinement status: Secondary | ICD-10-CM | POA: Diagnosis not present

## 2021-09-16 LAB — COMPREHENSIVE METABOLIC PANEL
ALT: 19 U/L (ref 0–44)
AST: 16 U/L (ref 15–41)
Albumin: 3.9 g/dL (ref 3.5–5.0)
Alkaline Phosphatase: 54 U/L (ref 38–126)
Anion gap: 11 (ref 5–15)
BUN: 17 mg/dL (ref 8–23)
CO2: 23 mmol/L (ref 22–32)
Calcium: 9.6 mg/dL (ref 8.9–10.3)
Chloride: 102 mmol/L (ref 98–111)
Creatinine, Ser: 0.81 mg/dL (ref 0.44–1.00)
GFR, Estimated: >60 mL/min (ref >60)
Glucose, Bld: 131 mg/dL — ABNORMAL HIGH (ref 70–99)
Potassium: 3.6 mmol/L (ref 3.5–5.1)
Sodium: 136 mmol/L (ref 135–145)
Total Bilirubin: 0.8 mg/dL (ref 0.3–1.2)
Total Protein: 7.5 g/dL (ref 6.5–8.1)

## 2021-09-16 LAB — CBC WITH DIFFERENTIAL/PLATELET
Abs Immature Granulocytes: 0.11 10*3/uL — ABNORMAL HIGH (ref 0.00–0.07)
Basophils Absolute: 0 10*3/uL (ref 0.0–0.1)
Basophils Relative: 0 %
Eosinophils Absolute: 0 10*3/uL (ref 0.0–0.5)
Eosinophils Relative: 0 %
HCT: 41.2 % (ref 36.0–46.0)
Hemoglobin: 13.6 g/dL (ref 12.0–15.0)
Immature Granulocytes: 1 %
Lymphocytes Relative: 7 %
Lymphs Abs: 1.3 10*3/uL (ref 0.7–4.0)
MCH: 30.3 pg (ref 26.0–34.0)
MCHC: 33 g/dL (ref 30.0–36.0)
MCV: 91.8 fL (ref 80.0–100.0)
Monocytes Absolute: 1.9 10*3/uL — ABNORMAL HIGH (ref 0.1–1.0)
Monocytes Relative: 10 %
Neutro Abs: 16.7 10*3/uL — ABNORMAL HIGH (ref 1.7–7.7)
Neutrophils Relative %: 82 %
Platelets: 208 10*3/uL (ref 150–400)
RBC: 4.49 MIL/uL (ref 3.87–5.11)
RDW: 12.6 % (ref 11.5–15.5)
WBC: 20.1 10*3/uL — ABNORMAL HIGH (ref 4.0–10.5)
nRBC: 0 % (ref 0.0–0.2)

## 2021-09-16 LAB — RESP PANEL BY RT-PCR (FLU A&B, COVID) ARPGX2
Influenza A by PCR: NEGATIVE
Influenza B by PCR: NEGATIVE
SARS Coronavirus 2 by RT PCR: NEGATIVE

## 2021-09-16 LAB — LIPASE, BLOOD: Lipase: 28 U/L (ref 11–51)

## 2021-09-16 LAB — BRAIN NATRIURETIC PEPTIDE: B Natriuretic Peptide: 194.6 pg/mL — ABNORMAL HIGH (ref 0.0–100.0)

## 2021-09-16 MED ORDER — IPRATROPIUM-ALBUTEROL 0.5-2.5 (3) MG/3ML IN SOLN
3.0000 mL | Freq: Four times a day (QID) | RESPIRATORY_TRACT | Status: DC
  Administered 2021-09-17 – 2021-09-20 (×14): 3 mL via RESPIRATORY_TRACT
  Filled 2021-09-16 (×14): qty 3

## 2021-09-16 MED ORDER — PIPERACILLIN-TAZOBACTAM 3.375 G IVPB 30 MIN
3.3750 g | INTRAVENOUS | Status: AC
  Administered 2021-09-16: 3.375 g via INTRAVENOUS
  Filled 2021-09-16: qty 50

## 2021-09-16 MED ORDER — FENTANYL CITRATE PF 50 MCG/ML IJ SOSY
50.0000 ug | PREFILLED_SYRINGE | Freq: Once | INTRAMUSCULAR | Status: AC
  Administered 2021-09-16: 50 ug via INTRAVENOUS
  Filled 2021-09-16: qty 1

## 2021-09-16 MED ORDER — IOHEXOL 300 MG/ML  SOLN
100.0000 mL | Freq: Once | INTRAMUSCULAR | Status: AC | PRN
  Administered 2021-09-16: 100 mL via INTRAVENOUS

## 2021-09-16 MED ORDER — SODIUM CHLORIDE 0.9 % IV SOLN: INTRAVENOUS | Status: DC

## 2021-09-16 MED ORDER — PIPERACILLIN-TAZOBACTAM 3.375 G IVPB
3.3750 g | Freq: Three times a day (TID) | INTRAVENOUS | Status: DC
  Administered 2021-09-16 – 2021-09-21 (×14): 3.375 g via INTRAVENOUS
  Filled 2021-09-16 (×15): qty 50

## 2021-09-16 MED ORDER — MORPHINE SULFATE (PF) 2 MG/ML IV SOLN: 2.0000 mg | INTRAVENOUS | Status: DC | PRN

## 2021-09-16 MED ORDER — IPRATROPIUM-ALBUTEROL 0.5-2.5 (3) MG/3ML IN SOLN
3.0000 mL | RESPIRATORY_TRACT | Status: DC | PRN
  Administered 2021-09-16 – 2021-09-18 (×5): 3 mL via RESPIRATORY_TRACT
  Filled 2021-09-16 (×6): qty 3

## 2021-09-16 MED ORDER — ONDANSETRON HCL 4 MG/2ML IJ SOLN
4.0000 mg | Freq: Once | INTRAMUSCULAR | Status: AC
  Administered 2021-09-16: 4 mg via INTRAVENOUS
  Filled 2021-09-16: qty 2

## 2021-09-16 MED ORDER — IPRATROPIUM-ALBUTEROL 0.5-2.5 (3) MG/3ML IN SOLN
3.0000 mL | Freq: Once | RESPIRATORY_TRACT | Status: AC
  Administered 2021-09-16: 3 mL via RESPIRATORY_TRACT

## 2021-09-16 NOTE — H&P (Signed)
History and Physical    [name] KDT:[id number] DOB: 17-Jan-1941 DOA: 09/16/2021 DOS: the patient was seen and examined on 09/16/2021 PCP: Susy Frizzle, MD  Patient coming from: Home  Chief Complaint:  Chief Complaint  Patient presents with   Abdominal Pain    HPI: Virginia Walters is a 81 y.o. female with medical history significant of COPD, chronic systolic heart failure, . Patient presented from her PCP office secondary to worsening left sided abdominal pain that started about 2 days pain. Pain is described as sharp and non-radiating. She reports not taking any medication to help with the pain. She reports one episode of vomiting yesterday. She reports chronic constipations. No fevers or chills. No leg swelling or palpitation.  Review of Systems: As mentioned in the history of present illness. All other systems reviewed and are negative. Past Medical History:  Diagnosis Date   CHF (congestive heart failure) (HCC)    Dyslipidemia    Frequent falls    GERD (gastroesophageal reflux disease)    HTN (hypertension)    Memory loss    PFO (patent foramen ovale)    Retinal artery occlusion    Weakness    Past Surgical History:  Procedure Laterality Date   ANKLE ARTHROSCOPY WITH OPEN REDUCTION INTERNAL FIXATION (ORIF) Right    APPENDECTOMY     COLONOSCOPY N/A 07/21/2018   Procedure: COLONOSCOPY;  Surgeon: Lin Landsman, MD;  Location: [facility];  Service: Gastroenterology;  Laterality: N/A;   COLONOSCOPY WITH PROPOFOL N/A 04/01/2020   Procedure: COLONOSCOPY WITH PROPOFOL;  Surgeon: Virgel Manifold, MD;  Location: [facility] ENDOSCOPY;  Service: Endoscopy;  Laterality: N/A;   ESOPHAGOGASTRODUODENOSCOPY N/A 07/21/2018   Procedure: ESOPHAGOGASTRODUODENOSCOPY (EGD);  Surgeon: Lin Landsman, MD;  Location: [facility] ENDOSCOPY;  Service: Gastroenterology;  Laterality: N/A;   ESOPHAGOGASTRODUODENOSCOPY N/A 02/11/2020   Procedure: ESOPHAGOGASTRODUODENOSCOPY (EGD);   Surgeon: Virgel Manifold, MD;  Location: [facility] ENDOSCOPY;  Service: Endoscopy;  Laterality: N/A;   ESOPHAGOGASTRODUODENOSCOPY (EGD) WITH PROPOFOL N/A 04/01/2020   Procedure: ESOPHAGOGASTRODUODENOSCOPY (EGD) WITH PROPOFOL;  Surgeon: Virgel Manifold, MD;  Location: [facility] ENDOSCOPY;  Service: Endoscopy;  Laterality: N/A;   TUBAL LIGATION     Social History:  reports that she quit smoking about 40 years ago. Her smoking use included cigarettes. She has a 15.00 pack-year smoking history. She has never used smokeless tobacco. She reports current alcohol use. She reports that she does not use drugs.  Allergies  Allergen Reactions   Oxycodone Itching    Family History  Problem Relation Age of Onset   Esophageal cancer Father    Dementia Mother     Prior to Admission medications   Medication Sig Start Date End Date Taking? Authorizing Provider  B Complex-Biotin-FA (B-COMPLEX PO) Take 1 capsule by mouth daily.    Yes [provider]  Calcium Carbonate-Vitamin D (CALCIUM + D PO) Take 1 capsule by mouth 2 (two) times daily.   Yes [provider]  Cholecalciferol (VITAMIN D) 2000 UNITS CAPS Take 1 capsule by mouth daily.   Yes [provider]  dorzolamide (TRUSOPT) 2 % ophthalmic solution Place 1 drop into the left eye 2 (two) times daily.    [provider]  doxazosin (CARDURA) 4 MG tablet TAKE ONE-HALF TABLET BY  MOUTH DAILY 05/31/21   Susy Frizzle, MD  ferrous sulfate 325 (65 FE) MG EC tablet Take 1 tablet (325 mg total) by mouth 2 (two) times daily. 02/21/20 02/20/21  Susy Frizzle, MD  fish oil-omega-3 fatty acids 1000 MG capsule Take 2 g by mouth 2 (two) times daily.    [provider]  hydrochlorothiazide (HYDRODIURIL) 25 MG tablet Take 1 tablet (25 mg total) by mouth daily. 03/05/20   Susy Frizzle, MD  latanoprost (XALATAN) 0.005 % ophthalmic solution Place 1 drop into the left eye at bedtime.  12/05/17   [provider]   lisinopril (ZESTRIL) 40 MG tablet TAKE 1 TABLET BY MOUTH  DAILY 06/07/21   Susy Frizzle, MD  magnesium 30 MG tablet Take 30 mg by mouth 2 (two) times daily.    [provider]  melatonin 5 MG TABS Take 5 mg by mouth at bedtime.    [provider]  Multiple Vitamin (MULTIVITAMIN) tablet Take 1 tablet by mouth daily.    [provider]  pantoprazole (PROTONIX) 40 MG tablet Take 40 mg by mouth daily.    [provider]  potassium chloride (KLOR-CON) 10 MEQ tablet TAKE 1 TABLET BY MOUTH  DAILY 11/11/20   Susy Frizzle, MD  pravastatin (PRAVACHOL) 40 MG tablet TAKE 1 TABLET BY MOUTH  DAILY 12/30/20   Susy Frizzle, MD  SYMBICORT 160-4.5 MCG/ACT inhaler USE 2 INHALATIONS BY MOUTH  TWICE DAILY 03/04/20   Susy Frizzle, MD  traZODone (DESYREL) 50 MG tablet TAKE 1 TABLET BY MOUTH AT  BEDTIME 01/28/21   Susy Frizzle, MD  Turmeric 500 MG CAPS Take 500 mg by mouth daily.     [provider]  venlafaxine XR (EFFEXOR-XR) 75 MG 24 hr capsule TAKE 1 CAPSULE BY MOUTH  DAILY WITH BREAKFAST 04/09/21   Susy Frizzle, MD  verapamil (CALAN-SR) 240 MG CR tablet TAKE 1 TABLET BY MOUTH AT  BEDTIME 07/05/21   Susy Frizzle, MD  vitamin E 180 MG (400 UNITS) capsule Take 180 mg by mouth daily.    [provider]    Physical Exam: Vitals:   09/16/21 1339 09/16/21 1533 09/16/21 1543 09/16/21 1700  BP: (!) 173/81 (!) 152/66  (!) 167/61  Pulse: 76 84  73  Resp: 18 18  18   Temp: 98.7 F (37.1 C)     TempSrc: Oral     SpO2: 96% 94%  100%  Weight:   108 kg   Height:   5\' 5"  (1.651 m)    Physical Exam Constitutional:      Appearance: She is obese.  Eyes:     Extraocular Movements: Extraocular movements intact.     Conjunctiva/sclera: Conjunctivae normal.     Pupils: Pupils are equal, round, and reactive to light.  Neck:     Thyroid: No thyroid mass.  Cardiovascular:     Rate and Rhythm: Normal rate and regular rhythm.     Pulses:  Normal pulses.     Heart sounds: No murmur heard. Pulmonary:     Effort: Tachypnea and accessory muscle usage present.     Breath sounds: Normal breath sounds and air entry. No stridor, decreased air movement or transmitted upper airway sounds. No wheezing, rhonchi or rales.  Abdominal:     General: Abdomen is protuberant. Bowel sounds are normal. There is distension.     Palpations: Abdomen is soft.     Tenderness: There is abdominal tenderness in the right upper quadrant.  Musculoskeletal:     Cervical back: Full passive range of motion without pain.     Right lower leg: No edema.     Left lower leg: No edema.  Lymphadenopathy:     Cervical: No cervical adenopathy.  Skin:    General: Skin is warm and dry.  Neurological:     Mental Status: She is alert.     Sensory: Sensation is intact.     Data Reviewed:  Afebrile, no tachycardia. AST/ALT and alkaline phosphatase within normal limits. WBC of 20,100. Chest x-ray reviewed and unremarkable for acute process.  Assessment and Plan: * Acute cholecystitis Diagnosed on CT abdomen/pelvis. No evidence of choledocholithiasis on imaging. ALP, AST, ALT wnl. Associated leukocytosis. Patient with continued abdominal pain which has improved with IV analgesics. General surgery consulted and have recommended IR consult for percutaneous cholecystostomy. -Follow-up IR recommendations -Morphine prn -NPO -NS IV fluids @ 75 mL/hr overnight -Blood cultures, AM CMP/CBC  Tachypnea Unsure of etiology. Per patient, respiratory status is stable. Patient is asymptomatic. No associated tachycardia or fluid overload. No hypoxia. Upper respiratory wheezing. Chest x-ray is unremarkable. -Will manage with bronchodilators for now  COPD with chronic bronchitis ([place])- (present on admission) Patient has previously required oxygen which has been discontinued. Currently has some wheezing but does not seem overtly in exacerbation as wheezing is more upper airway.  Patient was prescribed Symbicort as an outpatient but is currently not using. -Duoneb x1 now -Duoneb prn  Chronic systolic heart failure ([place])- (present on admission) Euvolemic. Patient's last LVEF mildly reduced at 45-50% in 2019. On lisinopril. Not on diuretic therapy. Noted that patient is on verapamil for blood pressure control. Weight on admission of 238.1 lbs (108 kg). -Daily weights, strict in out -Obtain BNP  Chronic kidney disease, stage 3a (HCC) Creatinine stable and at baseline.  Dementia with behavioral problem- (present on admission) Noted on history. Patient lives alone with minimal support. -PT/OT/TOC -Delirium precautions  GERD (gastroesophageal reflux disease)- (present on admission) Patient is on Protonix as an outpatient. Held secondary to NPO status currently.  Dyslipidemia- (present on admission) Patient is on Pravastatin as an outpatient. Held secondary to NPO status currently.  HTN (hypertension)- (present on admission) Patient is on verapamil, lisinopril, hydrochlorothiazide and doxazosin which are held secondary to NPO status. Slightly hypertensive currently. -Hydralazine IV prn while NPO    Advance Care Planning:   Code Status: Full Code  Consults: General surgery, Interventional radiology  Family Communication: None at bedside   Author: Cordelia Poche, MD 09/16/2021 5:57 PM  For on call review [email or web address].

## 2021-09-16 NOTE — Assessment & Plan Note (Addendum)
Diagnosed on CT abdomen/pelvis. No evidence of choledocholithiasis on imaging. ALP, AST, ALT wnl. Associated leukocytosis. General surgery consulted and recommended IR consult for percutaneous cholecystostomy, which was completed on 2/3. Leukocytosis improving. Blood cultures with no growth to date. Zosyn transitioned to Augmentin for discharge with plan for total of 10 days of antibiotic treatment. Patient to follow-up with IR for drain management and general surgery for consideration of cholecystectomy.

## 2021-09-16 NOTE — ED Triage Notes (Addendum)
Pt arrived via EMS, from dr office. C/o right sided abd pain and vomiting x2 days.

## 2021-09-16 NOTE — Progress Notes (Signed)
Pharmacy Antibiotic Note  Virginia Walters is a 81 y.o. female admitted on 09/16/2021 with  acute cholecystitis . Pharmacy has been consulted for Zosyn dosing.  Plan: Zosyn 3.375 IV x 1 over 30 minutes, then Zosyn 3.375g IV q8h (each dose infused over 4 hours)  Need for further dosage adjustment appears unlikely at present, so pharmacy will sign off at this time.  Please reconsult if a change in clinical status warrants re-evaluation of dosage.    Height: 5\' 5"  (165.1 cm) Weight: 108 kg (238 lb 1.6 oz) IBW/kg (Calculated) : 57  Temp (24hrs), Avg:98.7 F (37.1 C), Min:98.7 F (37.1 C), Max:98.7 F (37.1 C)  Recent Labs  Lab 09/16/21 1354  WBC 20.1*  CREATININE 0.81    Estimated Creatinine Clearance: 67.7 mL/min (by C-G formula based on SCr of 0.81 mg/dL).    Allergies  Allergen Reactions   Oxycodone Itching     Thank you for allowing pharmacy to be a part of this patients care.  Luiz Ochoa 09/16/2021 4:34 PM

## 2021-09-16 NOTE — Assessment & Plan Note (Addendum)
Euvolemic. Patient's last LVEF mildly reduced at 45-50% in 2019. On lisinopril. Not on diuretic therapy. Noted that patient is on verapamil for blood pressure control. Weight on admission of 238.1 lbs (108 kg). BNP slightly elevated. Weight of 236.6 lbs on 2/5.

## 2021-09-16 NOTE — Consult Note (Addendum)
Virginia Walters [birth year]  [id number].    Requesting MD: Dr. Christ Kick Chief Complaint/Reason for Consult: Acute Cholecystitis   HPI: Virginia Walters is a 81 y.o. female with a hx of CHF (last EF 2019 w/ EF 45-50% w/ diffuse hypokinesis), PFO, HTN and HLD who presented to the emergency department for abdominal pain.  Patient reports 2-3 days ago after eating spaghetti meatballs she began having acute onset of right-sided abdominal pain without radiation.  She notes the pain has been severe since onset and worse with deep breathing, movement and palpation.  She did not get out of bed yesterday secondary to the pain.  Reports associated nausea yesterday and one episode of emesis.  Nausea has since resolved. She is unsure if p.o. intake makes her symptoms worse as she is only taken a few sips of Gatorade since onset of symptoms.  No history of similar symptoms in the past. Denies fever, chills, chest pain, diarrhea or new urinary symptoms.  Patient is noted to be very tachypneic during encounter.  She reports she is short of breath and has been since symptom onset and thinks this is 2/2 pain.  Patient is not on any blood thinners.  She reports history of open appendectomy when she was a teenager.  Patient presented to her PCPs office for evaluation and was directed to the ED.  In the ED she was noted to be afebrile without tachycardia or hypotension.  Noted tachypnea.  WBC 20.1.  LFTs and lipase WNL.  CT A/P showed distended gallbladder with cholelithiasis and pericholecystic fluid. We were asked to see. No abx have been ordered yet. Patient is NPO.  Patient lives at home by herself.  She has been dealing with back pain and had an MRI last month for this. She has been working with a physical therapist at home.  She occasionally needs to walk with a walker.  Occasional alcohol use.  Former smoker.  ROS: Review of Systems  Constitutional:  Negative for chills and fever.  Respiratory:   Positive for shortness of breath.   Cardiovascular:  Negative for chest pain and leg swelling.  Gastrointestinal:  Positive for abdominal pain, constipation (Notes constipation over the last 1 month but was able to have a small, hard bowel movement this morning.  She notes she has been dealing with constipation over the last 1 month but was able to have a small, hard bowel movement this morning), nausea and vomiting.  Genitourinary:        Reports that she is incontinent of urine at baseline requiring her to wear depends but has had no recent changes such as dysuria or hematuria.  Musculoskeletal:  Positive for back pain (at baseline).  All other systems reviewed and are negative.  Family History  Problem Relation Age of Onset   Esophageal cancer Father    Dementia Mother     Past Medical History:  Diagnosis Date   CHF (congestive heart failure) (HCC)    Dyslipidemia    Frequent falls    GERD (gastroesophageal reflux disease)    HTN (hypertension)    Memory loss    PFO (patent foramen ovale)    Retinal artery occlusion    Weakness     Past Surgical History:  Procedure Laterality Date   ANKLE ARTHROSCOPY WITH OPEN REDUCTION INTERNAL FIXATION (ORIF) Right    APPENDECTOMY     COLONOSCOPY N/A 07/21/2018   Procedure: COLONOSCOPY;  Surgeon: Lin Landsman, MD;  Location: [facility]  ENDOSCOPY;  Service: Gastroenterology;  Laterality: N/A;   COLONOSCOPY WITH PROPOFOL N/A 04/01/2020   Procedure: COLONOSCOPY WITH PROPOFOL;  Surgeon: Virgel Manifold, MD;  Location: [facility] ENDOSCOPY;  Service: Endoscopy;  Laterality: N/A;   ESOPHAGOGASTRODUODENOSCOPY N/A 07/21/2018   Procedure: ESOPHAGOGASTRODUODENOSCOPY (EGD);  Surgeon: Lin Landsman, MD;  Location: [facility] ENDOSCOPY;  Service: Gastroenterology;  Laterality: N/A;   ESOPHAGOGASTRODUODENOSCOPY N/A 02/11/2020   Procedure: ESOPHAGOGASTRODUODENOSCOPY (EGD);  Surgeon: Virgel Manifold, MD;  Location: [facility] ENDOSCOPY;  Service: Endoscopy;   Laterality: N/A;   ESOPHAGOGASTRODUODENOSCOPY (EGD) WITH PROPOFOL N/A 04/01/2020   Procedure: ESOPHAGOGASTRODUODENOSCOPY (EGD) WITH PROPOFOL;  Surgeon: Virgel Manifold, MD;  Location: [facility] ENDOSCOPY;  Service: Endoscopy;  Laterality: N/A;   TUBAL LIGATION      Social History:  reports that she quit smoking about 40 years ago. Her smoking use included cigarettes. She has a 15.00 pack-year smoking history. She has never used smokeless tobacco. She reports current alcohol use. She reports that she does not use drugs.  Allergies:  Allergies  Allergen Reactions   Oxycodone Itching    (Not in a hospital admission)    Physical Exam: Blood pressure (!) 152/66, pulse 84, temperature 98.7 F (37.1 C), temperature source Oral, resp. rate 18, height 5\' 5"  (1.651 m), weight 108 kg, SpO2 94 %. General: pleasant, elderly white female who is laying in bed in mild distress HEENT: head is normocephalic, atraumatic.  Sclera are noninjected.  PERRL.  Ears and nose without any masses or lesions.  Mouth is pink and moist. Dentition fair Heart: regular, rate, and rhythm.  No obvious murmurs.  Palpable pedal pulses bilaterally  Lungs: Patient reports she is short of breath.  She appears tachypneic.  Lungs sound distant at the bases with possibly some rales. Upper lung fields clear.  Abd: Her abdomen is distended. Patient with epigastric and right upper quadrant tenderness with positive Murphy sign.  Questions palpable gallbladder. Prior open appendectomy scar well healed. No masses or organomegaly MS: no BUE/BLE edema, calves soft and nontender Skin: warm and dry with no masses, lesions, or rashes Psych: A&Ox4 with an appropriate affect Neuro: cranial nerves grossly intact, equal strength in BUE/BLE bilaterally, normal speech, thought process intact, moves all extremities, gait not assessed   Results for orders placed or performed during the hospital encounter of 09/16/21 (from the past 48 hour(s))  CBC  with Differential     Status: Abnormal   Collection Time: 09/16/21  1:54 PM  Result Value Ref Range   WBC 20.1 (H) 4.0 - 10.5 K/uL   RBC 4.49 3.87 - 5.11 MIL/uL   Hemoglobin 13.6 12.0 - 15.0 g/dL   HCT 41.2 36.0 - 46.0 %   MCV 91.8 80.0 - 100.0 fL   MCH 30.3 26.0 - 34.0 pg   MCHC 33.0 30.0 - 36.0 g/dL   RDW 12.6 11.5 - 15.5 %   Platelets 208 150 - 400 K/uL   nRBC 0.0 0.0 - 0.2 %   Neutrophils Relative % 82 %   Neutro Abs 16.7 (H) 1.7 - 7.7 K/uL   Lymphocytes Relative 7 %   Lymphs Abs 1.3 0.7 - 4.0 K/uL   Monocytes Relative 10 %   Monocytes Absolute 1.9 (H) 0.1 - 1.0 K/uL   Eosinophils Relative 0 %   Eosinophils Absolute 0.0 0.0 - 0.5 K/uL   Basophils Relative 0 %   Basophils Absolute 0.0 0.0 - 0.1 K/uL   Immature Granulocytes 1 %   Abs Immature Granulocytes 0.11 (H) 0.00 -  0.07 K/uL    Comment: Performed at [facility], [place] [address], [place], [place] [address]  Comprehensive metabolic panel     Status: Abnormal   Collection Time: 09/16/21  1:54 PM  Result Value Ref Range   Sodium 136 135 - 145 mmol/L   Potassium 3.6 3.5 - 5.1 mmol/L   Chloride 102 98 - 111 mmol/L   CO2 23 22 - 32 mmol/L   Glucose, Bld 131 (H) 70 - 99 mg/dL    Comment: Glucose reference range applies only to samples taken after fasting for at least 8 hours.   BUN 17 8 - 23 mg/dL   Creatinine, Ser 0.81 0.44 - 1.00 mg/dL   Calcium 9.6 8.9 - 10.3 mg/dL   Total Protein 7.5 6.5 - 8.1 g/dL   Albumin 3.9 3.5 - 5.0 g/dL   AST 16 15 - 41 U/L   ALT 19 0 - 44 U/L   Alkaline Phosphatase 54 38 - 126 U/L   Total Bilirubin 0.8 0.3 - 1.2 mg/dL   GFR, Estimated >60 >60 mL/min    Comment: (NOTE) Calculated using the CKD-EPI Creatinine Equation (2021)    Anion gap 11 5 - 15    Comment: Performed at [facility], [place] [address] [place], [place] [address]  Lipase, blood     Status: None   Collection Time: 09/16/21  1:54 PM  Result Value Ref Range   Lipase 28 11 - 51 U/L     Comment: Performed at [facility], [place] [address], [place], [place] [address]   CT ABDOMEN PELVIS W CONTRAST  Result Date: 09/16/2021 CLINICAL DATA:  Acute abdominal pain. Right-sided pain and vomiting for 2 days EXAM: CT ABDOMEN AND PELVIS WITH CONTRAST TECHNIQUE: Multidetector CT imaging of the abdomen and pelvis was performed using the standard protocol following bolus administration of intravenous contrast. RADIATION DOSE REDUCTION: This exam was performed according to the departmental dose-optimization program which includes automated exposure control, adjustment of the mA and/or kV according to patient size and/or use of iterative reconstruction technique. CONTRAST:  168mL OMNIPAQUE IOHEXOL 300 MG/ML  SOLN COMPARISON:  None. FINDINGS: Lower chest: No acute abnormality. Hepatobiliary: Diffuse low attenuation of the liver as can be seen with hepatic steatosis. No hepatic mass. No intrahepatic or extrahepatic biliary ductal dilatation. Distended gallbladder with cholelithiasis and pericholecystic inflammatory changes as can be seen with acute cholecystitis. Pancreas: Unremarkable. No pancreatic ductal dilatation or surrounding inflammatory changes. Spleen: Normal in size without focal abnormality. Adrenals/Urinary Tract: Normal adrenal glands. 4.5 cm right interpolar fluid attenuating renal mass consistent with a cyst. No urolithiasis or obstructive uropathy. Normal bladder. Stomach/Bowel: Moderate size hiatal hernia. Stomach is otherwise unremarkable. Appendix appears normal. No evidence of bowel wall thickening, distention, or inflammatory changes. Incidental note made of a duodenal diverticulum. Diverticulosis without evidence of diverticulitis. Vascular/Lymphatic: Aortic atherosclerosis. No enlarged abdominal or pelvic lymph nodes. Reproductive: Uterus and bilateral adnexa are unremarkable. Other: No abdominal wall hernia or abnormality. No abdominopelvic ascites. Musculoskeletal: No  acute osseous abnormality. Chronic T12 and L4 vertebral body compression fractures. Anterior bridging osteophytes throughout the thoracolumbar spine as can be seen with diffuse idiopathic skeletal hyperostosis. Degenerative disease with disc height loss at L3-4, L4-5 and L5-S1. Severe bilateral facet arthropathy at L3-4, L4-5 and L5-S1. Severe bilateral foraminal stenosis at L3-4. Severe right foraminal stenosis at L4-5. Severe right foraminal stenosis at L5-S1. Moderate left foraminal stenosis at L5-S1. IMPRESSION: 1. Distended gallbladder with cholelithiasis and pericholecystic inflammatory  changes as can be seen with acute cholecystitis. 2. Hepatic steatosis. 3. Moderate size hiatal hernia. 4. Diverticulosis without evidence of diverticulitis. 5. Diffuse lumbar spine spondylosis as described above. 6. Aortic Atherosclerosis (ICD10-I70.0). Electronically Signed   By: Kathreen Devoid M.D.   On: 09/16/2021 15:17    Anti-infectives (From admission, onward)    None       Assessment/Plan Acute Cholecystitis  - 81 y.o. female with Acute Cholecystitis on imaging. WBC 20.1. LFT's and Lipase wnl. CT showed distended gallbladder with cholelithiasis and pericholecystic inflammatory changes. She has epigastric and RUQ tenderness on exam with + Murphy's sign. Start IV Zosyn and keep NPO. I am concerned about her shortness of breath and tachypnea on exam.  Will get a chest x-ray now.  Would recommend medical admission.  Would ask for medical clearance before considering laparoscopic cholecystectomy.  If she was to acutely worsen would recommend Perc Chole.   FEN - NPO, IVF per [facility] VTE - SCDs, okay for chemical prophylaxis from a general surgery standpoint ID - Zosyn  SOB - patient is very short of breath and tachypneic on exam. She does have some rales at the lung bases b/l. Will get chest x-ray Hx CHF (last EF 2019 w/ EF 45-50% w/ diffuse hypokinesis) PFO HTN  HLD  This care required moderate level of  medical decision making.   Jillyn Ledger, [facility] Surgery 09/16/2021, 4:06 PM Please see Amion for pager number during day hours 7:00am-4:30pm

## 2021-09-16 NOTE — Progress Notes (Addendum)
Subjective:    Patient ID: Virginia Walters, female    DOB: Sep 28, 1940, 81 y.o.   MRN: [id number]  HPI: Virginia Walters is a 81 y.o. female presenting with her caregivers; live beside her.    Chief Complaint  Patient presents with   Pain   BACK PAIN MRI 08/2021 showed severe spinal stenosis with impingement.  Duration: chronic  Mechanism of injury: stenosis Location: right low Onset:  gradual Severity: severe Radiation: yes; right leg   Denies burning/painful urination, increased urinary frequency, urgency of urination.   ABDOMINAL PAIN  Duration: 2 days Onset: sudden Severity: severe Quality: sharp Location:  RLQ, RUQ  Episode duration: constant; worse with palpation Radiation:  yes; radiates to right lower abdomen with palpation Frequency: intermittent, worse with palpation  Fever: no Nausea: yes Vomiting: yes Weight loss: no Decreased appetite: yes Diarrhea: no Constipation: yes Blood in stool: no Heartburn: no Jaundice: no Rash: no Dysuria/urinary frequency: no Hematuria: no  Allergies  Allergen Reactions   Oxycodone Itching    Outpatient Encounter Medications as of 09/16/2021  Medication Sig Note   B Complex-Biotin-FA (B-COMPLEX PO) Take 1 capsule by mouth daily.     Calcium Carbonate-Vitamin D (CALCIUM + D PO) Take 1 capsule by mouth 2 (two) times daily.    Cholecalciferol (VITAMIN D) 2000 UNITS CAPS Take 1 capsule by mouth daily.    dorzolamide (TRUSOPT) 2 % ophthalmic solution Place 1 drop into the left eye 2 (two) times daily. 07/22/2021: LF 03/08/2021 90 day   doxazosin (CARDURA) 4 MG tablet TAKE ONE-HALF TABLET BY  MOUTH DAILY 07/22/2021: LF 04/13/2021 90 day   fish oil-omega-3 fatty acids 1000 MG capsule Take 2 g by mouth 2 (two) times daily.    hydrochlorothiazide (HYDRODIURIL) 25 MG tablet Take 1 tablet (25 mg total) by mouth daily.    latanoprost (XALATAN) 0.005 % ophthalmic solution Place 1 drop into the left eye at bedtime.  07/22/2021:  LF 04/12/2021 90 day   lisinopril (ZESTRIL) 40 MG tablet TAKE 1 TABLET BY MOUTH  DAILY 07/22/2021: LF 04/12/2021 90 day   magnesium 30 MG tablet Take 30 mg by mouth 2 (two) times daily.    melatonin 5 MG TABS Take 5 mg by mouth at bedtime.    Multiple Vitamin (MULTIVITAMIN) tablet Take 1 tablet by mouth daily.    pantoprazole (PROTONIX) 40 MG tablet Take 40 mg by mouth daily.    potassium chloride (KLOR-CON) 10 MEQ tablet TAKE 1 TABLET BY MOUTH  DAILY 07/22/2021: LF 03/05/2021 90 day   pravastatin (PRAVACHOL) 40 MG tablet TAKE 1 TABLET BY MOUTH  DAILY 07/22/2021: LF 01/28/2021 90 day   SYMBICORT 160-4.5 MCG/ACT inhaler USE 2 INHALATIONS BY MOUTH  TWICE DAILY    traZODone (DESYREL) 50 MG tablet TAKE 1 TABLET BY MOUTH AT  BEDTIME 07/22/2021: LF 02/23/2021 90 day   Turmeric 500 MG CAPS Take 500 mg by mouth daily.     venlafaxine XR (EFFEXOR-XR) 75 MG 24 hr capsule TAKE 1 CAPSULE BY MOUTH  DAILY WITH BREAKFAST 07/22/2021: LF 03/20/2021 90 day   verapamil (CALAN-SR) 240 MG CR tablet TAKE 1 TABLET BY MOUTH AT  BEDTIME 07/22/2021: LF 02/11/2021 90 day   vitamin E 180 MG (400 UNITS) capsule Take 180 mg by mouth daily.    ferrous sulfate 325 (65 FE) MG EC tablet Take 1 tablet (325 mg total) by mouth 2 (two) times daily.    No facility-administered encounter medications on file as of 09/16/2021.  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, stage 3a ([place]) 02/10/2020   COPD exacerbation ([place]) 91/79/1505   Chronic systolic heart failure ([place]) 08/29/2018   Acute on chronic heart failure with preserved ejection fraction (HCC)    Symptomatic anemia 07/20/2018   Dementia with behavioral problem 10/26/2016   Alcohol abuse 10/26/2016   Memory loss 10/12/2016   Gait  abnormality 10/12/2016   Orthostatic dizziness 10/12/2016   Paresthesia 10/12/2016   Abnormal EKG 12/28/2010   HTN (hypertension)    Dyslipidemia    PFO (patent foramen ovale)    Retinal artery occlusion    GERD (gastroesophageal reflux disease)     Past Medical History:  Diagnosis Date   CHF (congestive heart failure) (HCC)    Dyslipidemia    Frequent falls    GERD (gastroesophageal reflux disease)    HTN (hypertension)    Memory loss    PFO (patent foramen ovale)    Retinal artery occlusion    Weakness     Relevant past medical, surgical, family and social history reviewed and updated as indicated. Interim medical history since our last visit reviewed.  Review of Systems Per HPI unless specifically indicated above     Objective:    BP (!) 158/88    Pulse 77    Resp (!) 30    SpO2 93%   Wt Readings from Last 3 Encounters:  07/27/21 236 lb (107 kg)  07/22/21 242 lb 1 oz (109.8 kg)  05/07/21 242 lb (109.8 kg)    Physical Exam Vitals and nursing note reviewed.  Constitutional:      General: She is not in acute distress.    Appearance: She is obese. She is ill-appearing and toxic-appearing. She is not diaphoretic.  Eyes:     General: No scleral icterus.       Right eye: No discharge.        Left eye: No discharge.     Extraocular Movements:     Left eye: Normal extraocular motion.     Pupils:     Left eye: Pupil is reactive and not sluggish.     Comments: Exotropia of right eye; blind   Cardiovascular:     Rate and Rhythm: Normal rate and regular rhythm.     Heart sounds: Normal heart sounds. No murmur heard. Pulmonary:     Effort: Pulmonary effort is normal. Tachypnea present. No respiratory distress.     Breath sounds: Normal breath sounds. No wheezing, rhonchi or rales.  Abdominal:     General: Bowel sounds are normal.     Tenderness: There is abdominal tenderness in the right lower quadrant, left upper quadrant and left lower quadrant. There is guarding.  There is no right CVA tenderness or left CVA tenderness. Positive signs include Rovsing's sign.  Musculoskeletal:     Right lower leg: No edema.     Left lower leg: No edema.  Skin:    Coloration: Skin is pale. Skin is not jaundiced.     Findings: No erythema.  Neurological:     Mental Status: She is oriented to person, place, and time. She is lethargic.     Cranial Nerves: No facial asymmetry.  Motor: Weakness present.     Gait: Gait abnormal (sitting in wheelchair).      Assessment & Plan:  1. Chest pain, unspecified type Acute.  EKG today shows nonspecific t wave abnormality; no significant change from previous.    - EKG 12-Lead  2. Right lower quadrant abdominal pain Acute.  Concerning findings with constipation, significant abdominal tenderness, and decreased appetite with vomiting the past few days.  There is also tachypnea.  Concerning for acute abdomen differentials including bowel obstruction, kidney stone, diverticulitis, cholecystitis, urinary tract infection.  She has had appendectomy.  I recommended transportation via EMS to nearest Emergency Room for evaluation.  Patient in agreement with plan.  EMS called and transported to hospital.   3. Right sided sciatica Referral placed to Neurosurgery.    4. Foraminal stenosis of lumbar region Referral placed to Neurosurgery.   Follow up plan: Return for with PCP after evaluation in ER.

## 2021-09-16 NOTE — Assessment & Plan Note (Addendum)
Possibly related to COPD exacerbation as patient developed worsening wheezing. Per patient, tachypnea was worse than baseline. No associated tachycardia or fluid overload. Repeat chest x-ray looked clear. Tachypnea has resolved. See problem, COPD exacerbation.

## 2021-09-16 NOTE — Assessment & Plan Note (Addendum)
Patient is on Pravastatin as an outpatient. Held secondary to NPO status. Continue Pravastatin on discharge.

## 2021-09-16 NOTE — Assessment & Plan Note (Deleted)
Patient is on Protonix as an outpatient. Held secondary to NPO status currently.

## 2021-09-16 NOTE — Assessment & Plan Note (Addendum)
Patient has previously required oxygen which has been discontinued. Patient was prescribed Symbicort as an outpatient but is currently not using. See problem, COPD exacerbation.

## 2021-09-16 NOTE — Assessment & Plan Note (Signed)
Creatinine stable and at baseline.

## 2021-09-16 NOTE — Assessment & Plan Note (Addendum)
Noted on history. Patient lives alone with minimal support. Recommendation for SNF on discharge. Continue home Effexor.

## 2021-09-16 NOTE — ED Provider Triage Note (Signed)
Emergency Medicine Provider Triage Evaluation Note  Virginia Walters , a 81 y.o. female  was evaluated in triage.  Pt complains of right lower quadrant abdominal pain of 2-day duration.  Patient was evaluated at primary care office today and referred to the emergency room.  Patient denies fever, diarrhea, blood in stool.  Does endorse nausea vomiting yesterday.  Not currently nauseous.  Denies shortness of breath.  Review of Systems  Positive: As above Negative: As above  Physical Exam  BP (!) 173/81 (BP Location: Left Arm)    Pulse 76    Temp 98.7 F (37.1 C) (Oral)    Resp 18    SpO2 96%  Gen:   Awake, no distress   Resp:  Normal effort  MSK:   Moves extremities without difficulty  Other:    Medical Decision Making  Medically screening exam initiated at 1:43 PM.  Appropriate orders placed.  Luba I Moster was informed that the remainder of the evaluation will be completed by another provider, this initial triage assessment does not replace that evaluation, and the importance of remaining in the ED until their evaluation is complete.     Evlyn Courier, PA-C 09/16/21 1344

## 2021-09-16 NOTE — Assessment & Plan Note (Addendum)
Patient is on verapamil, lisinopril, hydrochlorothiazide and doxazosin which were held secondary to NPO status and restarted gradually. Continue home regimen on discharge.

## 2021-09-16 NOTE — ED Provider Notes (Signed)
[facility] DEPT Provider Note   CSN: [id number] Arrival date & time: 09/16/21  1330     History  Chief Complaint  Patient presents with   Abdominal Pain    Virginia Walters is a 81 y.o. female.  HPI     Home Medications Prior to Admission medications   Medication Sig Start Date End Date Taking? Authorizing Provider  B Complex-Biotin-FA (B-COMPLEX PO) Take 1 capsule by mouth daily.     [provider]  Calcium Carbonate-Vitamin D (CALCIUM + D PO) Take 1 capsule by mouth 2 (two) times daily.    [provider]  Cholecalciferol (VITAMIN D) 2000 UNITS CAPS Take 1 capsule by mouth daily.    [provider]  dorzolamide (TRUSOPT) 2 % ophthalmic solution Place 1 drop into the left eye 2 (two) times daily.    [provider]  doxazosin (CARDURA) 4 MG tablet TAKE ONE-HALF TABLET BY  MOUTH DAILY 05/31/21   Susy Frizzle, MD  ferrous sulfate 325 (65 FE) MG EC tablet Take 1 tablet (325 mg total) by mouth 2 (two) times daily. 02/21/20 02/20/21  Susy Frizzle, MD  fish oil-omega-3 fatty acids 1000 MG capsule Take 2 g by mouth 2 (two) times daily.    [provider]  hydrochlorothiazide (HYDRODIURIL) 25 MG tablet Take 1 tablet (25 mg total) by mouth daily. 03/05/20   Susy Frizzle, MD  latanoprost (XALATAN) 0.005 % ophthalmic solution Place 1 drop into the left eye at bedtime.  12/05/17   [provider]  lisinopril (ZESTRIL) 40 MG tablet TAKE 1 TABLET BY MOUTH  DAILY 06/07/21   Susy Frizzle, MD  magnesium 30 MG tablet Take 30 mg by mouth 2 (two) times daily.    [provider]  melatonin 5 MG TABS Take 5 mg by mouth at bedtime.    [provider]  Multiple Vitamin (MULTIVITAMIN) tablet Take 1 tablet by mouth daily.    [provider]  pantoprazole (PROTONIX) 40 MG tablet Take 40 mg by mouth daily.    [provider]  potassium chloride (KLOR-CON) 10 MEQ tablet  TAKE 1 TABLET BY MOUTH  DAILY 11/11/20   Susy Frizzle, MD  pravastatin (PRAVACHOL) 40 MG tablet TAKE 1 TABLET BY MOUTH  DAILY 12/30/20   Susy Frizzle, MD  SYMBICORT 160-4.5 MCG/ACT inhaler USE 2 INHALATIONS BY MOUTH  TWICE DAILY 03/04/20   Susy Frizzle, MD  traZODone (DESYREL) 50 MG tablet TAKE 1 TABLET BY MOUTH AT  BEDTIME 01/28/21   Susy Frizzle, MD  Turmeric 500 MG CAPS Take 500 mg by mouth daily.     [provider]  venlafaxine XR (EFFEXOR-XR) 75 MG 24 hr capsule TAKE 1 CAPSULE BY MOUTH  DAILY WITH BREAKFAST 04/09/21   Susy Frizzle, MD  verapamil (CALAN-SR) 240 MG CR tablet TAKE 1 TABLET BY MOUTH AT  BEDTIME 07/05/21   Susy Frizzle, MD  vitamin E 180 MG (400 UNITS) capsule Take 180 mg by mouth daily.    [provider]      Allergies    Oxycodone    Review of Systems   Review of Systems  Physical Exam Updated Vital Signs BP (!) 152/66    Pulse 84    Temp 98.7 F (37.1 C) (Oral)    Resp 18    Ht 5\' 5"  (1.651 m)    Wt 108 kg    SpO2 94%  BMI 39.62 kg/m  Physical Exam Vitals and nursing note reviewed.  Constitutional:      General: She is in acute distress.     Appearance: She is well-developed. She is ill-appearing. She is not toxic-appearing or diaphoretic.  HENT:     Head: Normocephalic and atraumatic.     Right Ear: External ear normal.     Left Ear: External ear normal.  Eyes:     Conjunctiva/sclera: Conjunctivae normal.     Pupils: Pupils are equal, round, and reactive to light.  Neck:     Trachea: Phonation normal.  Cardiovascular:     Rate and Rhythm: Normal rate and regular rhythm.     Heart sounds: Normal heart sounds.  Pulmonary:     Effort: Pulmonary effort is normal.     Breath sounds: Normal breath sounds.  Abdominal:     General: There is distension.     Palpations: Abdomen is soft. There is no mass.     Tenderness: There is abdominal tenderness (Right upper quadrant, moderate). There is guarding. There is no  rebound.     Hernia: No hernia is present.  Musculoskeletal:        General: Normal range of motion.     Cervical back: Normal range of motion and neck supple.  Skin:    General: Skin is warm and dry.     Coloration: Skin is not jaundiced or pale.  Neurological:     Mental Status: She is alert and oriented to person, place, and time.     Cranial Nerves: No cranial nerve deficit.     Sensory: No sensory deficit.     Motor: No abnormal muscle tone.     Coordination: Coordination normal.  Psychiatric:        Mood and Affect: Mood normal.        Behavior: Behavior normal.        Thought Content: Thought content normal.        Judgment: Judgment normal.    ED Results / Procedures / Treatments   Labs (all labs ordered are listed, but only abnormal results are displayed) Labs Reviewed  CBC WITH DIFFERENTIAL/PLATELET - Abnormal; Notable for the following components:      Result Value   WBC 20.1 (*)    Neutro Abs 16.7 (*)    Monocytes Absolute 1.9 (*)    Abs Immature Granulocytes 0.11 (*)    All other components within normal limits  COMPREHENSIVE METABOLIC PANEL - Abnormal; Notable for the following components:   Glucose, Bld 131 (*)    All other components within normal limits  RESP PANEL BY RT-PCR (FLU A&B, COVID) ARPGX2  LIPASE, BLOOD  URINALYSIS, ROUTINE W REFLEX MICROSCOPIC    EKG None  Radiology CT ABDOMEN PELVIS W CONTRAST  Result Date: 09/16/2021 CLINICAL DATA:  Acute abdominal pain. Right-sided pain and vomiting for 2 days EXAM: CT ABDOMEN AND PELVIS WITH CONTRAST TECHNIQUE: Multidetector CT imaging of the abdomen and pelvis was performed using the standard protocol following bolus administration of intravenous contrast. RADIATION DOSE REDUCTION: This exam was performed according to the departmental dose-optimization program which includes automated exposure control, adjustment of the mA and/or kV according to patient size and/or use of iterative reconstruction  technique. CONTRAST:  121mL OMNIPAQUE IOHEXOL 300 MG/ML  SOLN COMPARISON:  None. FINDINGS: Lower chest: No acute abnormality. Hepatobiliary: Diffuse low attenuation of the liver as can be seen with hepatic steatosis. No hepatic mass. No intrahepatic or extrahepatic biliary ductal dilatation. Distended  gallbladder with cholelithiasis and pericholecystic inflammatory changes as can be seen with acute cholecystitis. Pancreas: Unremarkable. No pancreatic ductal dilatation or surrounding inflammatory changes. Spleen: Normal in size without focal abnormality. Adrenals/Urinary Tract: Normal adrenal glands. 4.5 cm right interpolar fluid attenuating renal mass consistent with a cyst. No urolithiasis or obstructive uropathy. Normal bladder. Stomach/Bowel: Moderate size hiatal hernia. Stomach is otherwise unremarkable. Appendix appears normal. No evidence of bowel wall thickening, distention, or inflammatory changes. Incidental note made of a duodenal diverticulum. Diverticulosis without evidence of diverticulitis. Vascular/Lymphatic: Aortic atherosclerosis. No enlarged abdominal or pelvic lymph nodes. Reproductive: Uterus and bilateral adnexa are unremarkable. Other: No abdominal wall hernia or abnormality. No abdominopelvic ascites. Musculoskeletal: No acute osseous abnormality. Chronic T12 and L4 vertebral body compression fractures. Anterior bridging osteophytes throughout the thoracolumbar spine as can be seen with diffuse idiopathic skeletal hyperostosis. Degenerative disease with disc height loss at L3-4, L4-5 and L5-S1. Severe bilateral facet arthropathy at L3-4, L4-5 and L5-S1. Severe bilateral foraminal stenosis at L3-4. Severe right foraminal stenosis at L4-5. Severe right foraminal stenosis at L5-S1. Moderate left foraminal stenosis at L5-S1. IMPRESSION: 1. Distended gallbladder with cholelithiasis and pericholecystic inflammatory changes as can be seen with acute cholecystitis. 2. Hepatic steatosis. 3. Moderate  size hiatal hernia. 4. Diverticulosis without evidence of diverticulitis. 5. Diffuse lumbar spine spondylosis as described above. 6. Aortic Atherosclerosis (ICD10-I70.0). Electronically Signed   By: Kathreen Devoid M.D.   On: 09/16/2021 15:17    Procedures Procedures    Medications Ordered in ED Medications  0.9 %  sodium chloride infusion ( Intravenous New Bag/Given 09/16/21 1628)  piperacillin-tazobactam (ZOSYN) IVPB 3.375 g (has no administration in time range)  piperacillin-tazobactam (ZOSYN) IVPB 3.375 g (has no administration in time range)  iohexol (OMNIPAQUE) 300 MG/ML solution 100 mL (100 mLs Intravenous Contrast Given 09/16/21 1456)  fentaNYL (SUBLIMAZE) injection 50 mcg (50 mcg Intravenous Given 09/16/21 1628)  ondansetron (ZOFRAN) injection 4 mg (4 mg Intravenous Given 09/16/21 1628)    ED Course/ Medical Decision Making/ A&P Clinical Course as of 09/16/21 1636  Thu Sep 16, 2021  1542 Case discussed with general surgery practitioner who will see as a Optometrist.  They recommend hospitalist admission and keep patient n.p.o. for now. [EW]    Clinical Course User Index [EW] Daleen Bo, MD                           Medical Decision Making Patient presenting with malaise, anorexia, abdominal pain for several days.  History of appendectomy.  Patient lives alone and came here by private vehicle.  Amount and/or Complexity of Data Reviewed Labs:     Details: CBC, metabolic panel, viral panel, lipase-initial findings normal except white count elevated with left shift indicating infection. Radiology: ordered and independent interpretation performed.    Details: CT abdomen pelvis-right upper quadrant inflammatory changes with distended gallbladder and gallstones.  No perforation.  No ileus. Discussion of management or test interpretation with external provider(s): Consultation general surgery, to see patient and arrange for cholecystectomy when stable.  Keep patient n.p.o. for now.   Discussed with hospitalist to arrange admission.  Risk Prescription drug management. Decision regarding hospitalization. Elective major surgery with identified risk factors. Risk Details: Elderly patient with history of hypertension, memory loss, heart failure and COPD presenting with acute process likely requiring cholecystectomy after stabilization and treatment for cholecystitis.  Doubt sepsis or metabolic instability.  Consultation with general surgery for management of surgical condition and hospitalist to  arrange for admission.  Patient treated with IV fluids and narcotic analgesia.  Critical Care Total time providing critical care: 30-74 minutes          Final Clinical Impression(s) / ED Diagnoses Final diagnoses:  Cholecystitis    Rx / DC Orders ED Discharge Orders     None         Daleen Bo, MD 09/16/21 1636

## 2021-09-17 ENCOUNTER — Encounter (HOSPITAL_COMMUNITY): Payer: Self-pay | Admitting: Family Medicine

## 2021-09-17 ENCOUNTER — Observation Stay (HOSPITAL_COMMUNITY): Payer: Medicare Other

## 2021-09-17 DIAGNOSIS — R32 Unspecified urinary incontinence: Secondary | ICD-10-CM | POA: Diagnosis present

## 2021-09-17 DIAGNOSIS — N1831 Chronic kidney disease, stage 3a: Secondary | ICD-10-CM | POA: Diagnosis present

## 2021-09-17 DIAGNOSIS — R296 Repeated falls: Secondary | ICD-10-CM | POA: Diagnosis present

## 2021-09-17 DIAGNOSIS — Z87891 Personal history of nicotine dependence: Secondary | ICD-10-CM | POA: Diagnosis not present

## 2021-09-17 DIAGNOSIS — Q2112 Patent foramen ovale: Secondary | ICD-10-CM | POA: Diagnosis not present

## 2021-09-17 DIAGNOSIS — G4700 Insomnia, unspecified: Secondary | ICD-10-CM | POA: Diagnosis present

## 2021-09-17 DIAGNOSIS — F03918 Unspecified dementia, unspecified severity, with other behavioral disturbance: Secondary | ICD-10-CM | POA: Diagnosis present

## 2021-09-17 DIAGNOSIS — R202 Paresthesia of skin: Secondary | ICD-10-CM | POA: Diagnosis present

## 2021-09-17 DIAGNOSIS — J9621 Acute and chronic respiratory failure with hypoxia: Secondary | ICD-10-CM | POA: Diagnosis present

## 2021-09-17 DIAGNOSIS — I5022 Chronic systolic (congestive) heart failure: Secondary | ICD-10-CM | POA: Diagnosis present

## 2021-09-17 DIAGNOSIS — Z79890 Hormone replacement therapy (postmenopausal): Secondary | ICD-10-CM | POA: Diagnosis not present

## 2021-09-17 DIAGNOSIS — K8000 Calculus of gallbladder with acute cholecystitis without obstruction: Secondary | ICD-10-CM | POA: Diagnosis present

## 2021-09-17 DIAGNOSIS — M549 Dorsalgia, unspecified: Secondary | ICD-10-CM | POA: Diagnosis present

## 2021-09-17 DIAGNOSIS — J441 Chronic obstructive pulmonary disease with (acute) exacerbation: Secondary | ICD-10-CM | POA: Diagnosis present

## 2021-09-17 DIAGNOSIS — R0682 Tachypnea, not elsewhere classified: Secondary | ICD-10-CM

## 2021-09-17 DIAGNOSIS — I130 Hypertensive heart and chronic kidney disease with heart failure and stage 1 through stage 4 chronic kidney disease, or unspecified chronic kidney disease: Secondary | ICD-10-CM | POA: Diagnosis present

## 2021-09-17 DIAGNOSIS — Z800 Family history of malignant neoplasm of digestive organs: Secondary | ICD-10-CM | POA: Diagnosis not present

## 2021-09-17 DIAGNOSIS — Z20822 Contact with and (suspected) exposure to covid-19: Secondary | ICD-10-CM | POA: Diagnosis present

## 2021-09-17 DIAGNOSIS — Z79899 Other long term (current) drug therapy: Secondary | ICD-10-CM | POA: Diagnosis not present

## 2021-09-17 DIAGNOSIS — K219 Gastro-esophageal reflux disease without esophagitis: Secondary | ICD-10-CM | POA: Diagnosis present

## 2021-09-17 DIAGNOSIS — Z9049 Acquired absence of other specified parts of digestive tract: Secondary | ICD-10-CM | POA: Diagnosis not present

## 2021-09-17 DIAGNOSIS — Z6839 Body mass index (BMI) 39.0-39.9, adult: Secondary | ICD-10-CM | POA: Diagnosis not present

## 2021-09-17 DIAGNOSIS — E669 Obesity, unspecified: Secondary | ICD-10-CM | POA: Diagnosis present

## 2021-09-17 DIAGNOSIS — E785 Hyperlipidemia, unspecified: Secondary | ICD-10-CM | POA: Diagnosis present

## 2021-09-17 DIAGNOSIS — K810 Acute cholecystitis: Secondary | ICD-10-CM | POA: Diagnosis present

## 2021-09-17 HISTORY — PX: IR PERC CHOLECYSTOSTOMY: IMG2326

## 2021-09-17 LAB — COMPREHENSIVE METABOLIC PANEL
ALT: 19 U/L (ref 0–44)
AST: 19 U/L (ref 15–41)
Albumin: 3.3 g/dL — ABNORMAL LOW (ref 3.5–5.0)
Alkaline Phosphatase: 60 U/L (ref 38–126)
Anion gap: 10 (ref 5–15)
BUN: 18 mg/dL (ref 8–23)
CO2: 23 mmol/L (ref 22–32)
Calcium: 8.7 mg/dL — ABNORMAL LOW (ref 8.9–10.3)
Chloride: 103 mmol/L (ref 98–111)
Creatinine, Ser: 0.99 mg/dL (ref 0.44–1.00)
GFR, Estimated: 58 mL/min — ABNORMAL LOW (ref >60)
Glucose, Bld: 158 mg/dL — ABNORMAL HIGH (ref 70–99)
Potassium: 3.5 mmol/L (ref 3.5–5.1)
Sodium: 136 mmol/L (ref 135–145)
Total Bilirubin: 1.1 mg/dL (ref 0.3–1.2)
Total Protein: 6.6 g/dL (ref 6.5–8.1)

## 2021-09-17 LAB — BLOOD GAS, ARTERIAL
Acid-base deficit: 0.2 mmol/L (ref 0.0–2.0)
Bicarbonate: 24.5 mmol/L (ref 20.0–28.0)
O2 Saturation: 95.1 %
Patient temperature: 98.6
pCO2 arterial: 42.7 mmHg (ref 32.0–48.0)
pH, Arterial: 7.377 (ref 7.350–7.450)
pO2, Arterial: 78.1 mmHg — ABNORMAL LOW (ref 83.0–108.0)

## 2021-09-17 LAB — CBC
HCT: 38.3 % (ref 36.0–46.0)
Hemoglobin: 12.5 g/dL (ref 12.0–15.0)
MCH: 30.5 pg (ref 26.0–34.0)
MCHC: 32.6 g/dL (ref 30.0–36.0)
MCV: 93.4 fL (ref 80.0–100.0)
Platelets: 181 10*3/uL (ref 150–400)
RBC: 4.1 MIL/uL (ref 3.87–5.11)
RDW: 12.7 % (ref 11.5–15.5)
WBC: 20.7 10*3/uL — ABNORMAL HIGH (ref 4.0–10.5)
nRBC: 0 % (ref 0.0–0.2)

## 2021-09-17 LAB — PROTIME-INR
INR: 1.4 — ABNORMAL HIGH (ref 0.8–1.2)
Prothrombin Time: 17 seconds — ABNORMAL HIGH (ref 11.4–15.2)

## 2021-09-17 MED ORDER — ALBUTEROL SULFATE (2.5 MG/3ML) 0.083% IN NEBU
INHALATION_SOLUTION | RESPIRATORY_TRACT | Status: AC
  Filled 2021-09-17: qty 3

## 2021-09-17 MED ORDER — ONDANSETRON HCL 4 MG/2ML IJ SOLN: 4.0000 mg | Freq: Four times a day (QID) | INTRAMUSCULAR | Status: DC | PRN

## 2021-09-17 MED ORDER — LIDOCAINE HCL 1 % IJ SOLN
INTRAMUSCULAR | Status: AC
  Filled 2021-09-17: qty 20

## 2021-09-17 MED ORDER — DORZOLAMIDE HCL 2 % OP SOLN
1.0000 [drp] | Freq: Two times a day (BID) | OPHTHALMIC | Status: DC
  Administered 2021-09-17 – 2021-09-21 (×9): 1 [drp] via OPHTHALMIC
  Filled 2021-09-17: qty 10

## 2021-09-17 MED ORDER — ONDANSETRON HCL 4 MG PO TABS: 4.0000 mg | ORAL_TABLET | Freq: Four times a day (QID) | ORAL | Status: DC | PRN

## 2021-09-17 MED ORDER — ACETAMINOPHEN 325 MG PO TABS: 650.0000 mg | ORAL_TABLET | Freq: Four times a day (QID) | ORAL | Status: DC | PRN

## 2021-09-17 MED ORDER — ENOXAPARIN SODIUM 40 MG/0.4ML IJ SOSY
40.0000 mg | PREFILLED_SYRINGE | INTRAMUSCULAR | Status: DC
  Administered 2021-09-18 – 2021-09-21 (×4): 40 mg via SUBCUTANEOUS
  Filled 2021-09-17 (×4): qty 0.4

## 2021-09-17 MED ORDER — FENTANYL CITRATE (PF) 100 MCG/2ML IJ SOLN
INTRAMUSCULAR | Status: AC | PRN
  Administered 2021-09-17: 25 ug via INTRAVENOUS

## 2021-09-17 MED ORDER — ACETAMINOPHEN 650 MG RE SUPP: 650.0000 mg | Freq: Four times a day (QID) | RECTAL | Status: DC | PRN

## 2021-09-17 MED ORDER — POLYETHYLENE GLYCOL 3350 17 G PO PACK: 17.0000 g | PACK | Freq: Every day | ORAL | Status: DC | PRN

## 2021-09-17 MED ORDER — METHYLPREDNISOLONE SODIUM SUCC 40 MG IJ SOLR
40.0000 mg | Freq: Every day | INTRAMUSCULAR | Status: DC
  Administered 2021-09-17 – 2021-09-21 (×5): 40 mg via INTRAVENOUS
  Filled 2021-09-17 (×4): qty 1

## 2021-09-17 MED ORDER — MIDAZOLAM HCL 2 MG/2ML IJ SOLN
INTRAMUSCULAR | Status: AC
  Filled 2021-09-17: qty 2

## 2021-09-17 MED ORDER — FENTANYL CITRATE (PF) 100 MCG/2ML IJ SOLN
INTRAMUSCULAR | Status: AC
  Filled 2021-09-17: qty 2

## 2021-09-17 MED ORDER — MIDAZOLAM HCL 2 MG/2ML IJ SOLN
INTRAMUSCULAR | Status: AC | PRN
  Administered 2021-09-17: .5 mg via INTRAVENOUS

## 2021-09-17 MED ORDER — LIDOCAINE HCL (PF) 1 % IJ SOLN
INTRAMUSCULAR | Status: AC | PRN
  Administered 2021-09-17: 10 mL

## 2021-09-17 MED ORDER — HYDRALAZINE HCL 20 MG/ML IJ SOLN: 10.0000 mg | Freq: Four times a day (QID) | INTRAMUSCULAR | Status: DC | PRN

## 2021-09-17 MED ORDER — LATANOPROST 0.005 % OP SOLN
1.0000 [drp] | Freq: Every day | OPHTHALMIC | Status: DC
  Administered 2021-09-17 – 2021-09-20 (×4): 1 [drp] via OPHTHALMIC
  Filled 2021-09-17: qty 2.5

## 2021-09-17 MED ORDER — IOHEXOL 300 MG/ML  SOLN: 50.0000 mL | Freq: Once | INTRAMUSCULAR | Status: DC | PRN

## 2021-09-17 MED ORDER — GUAIFENESIN ER 600 MG PO TB12
1200.0000 mg | ORAL_TABLET | Freq: Two times a day (BID) | ORAL | Status: DC
  Administered 2021-09-17 – 2021-09-21 (×8): 1200 mg via ORAL
  Filled 2021-09-17 (×8): qty 2

## 2021-09-17 NOTE — Procedures (Signed)
Pre procedural Dx: Acute cholecysitis Post procedural Dx: Same  Technically successful US and Fluoro guided placement of a 10 Fr drainage catheter placement into the gallbladder lumen. Chole tube connected to gravity bag.  EBL: None  Complications: None immediate  Jay Byrl Latin, MD Pager #: [phone]   

## 2021-09-17 NOTE — Evaluation (Signed)
Occupational Therapy Evaluation Patient Details Name: Virginia Walters MRN: [id number] DOB: 04-03-1941 Today's Date: 09/17/2021   History of Present Illness Patient is an 81 year old female admitted with Acute Cholecystitis with possible percutaneous cholecystostomy planned 2/3. PMH includes COPD, chronic systolic heart failure   Clinical Impression   Patient is an 81 year old female that lives alone and is typically independent with self care tasks. Currently patient limited by pain, cognition/alertness, and decreased global strength. Patient needing total A to don socks due to poor sitting balance. Patient needing max A to sit upright at edge of bed and heavy mod A to power up to standing with bed height elevated. Patient stood twice and with max cues to attempt weight shifting however unable and with limited standing tolerance therefore sat back onto edge of bed. Patient needing max A +2 to return semi-supine. Recommend continued acute OT services to maximize patient independence and self care in order to facilitate D/C to venue listed below.     Recommendations for follow up therapy are one component of a multi-disciplinary discharge planning process, led by the attending physician.  Recommendations may be updated based on patient status, additional functional criteria and insurance authorization.   Follow Up Recommendations  Skilled nursing-short term rehab (<3 hours/day)    Assistance Recommended at Discharge Frequent or constant Supervision/Assistance  Patient can return home with the following Two people to help with walking and/or transfers;A lot of help with bathing/dressing/bathroom;Assistance with cooking/housework;Help with stairs or ramp for entrance    Functional Status Assessment  Patient has had a recent decline in their functional status and demonstrates the ability to make significant improvements in function in a reasonable and predictable amount of time.  Equipment  Recommendations  BSC/3in1       Precautions / Restrictions Precautions Precautions: Fall Restrictions Weight Bearing Restrictions: No      Mobility Bed Mobility Overal bed mobility: Needs Assistance Bed Mobility: Rolling, Sidelying to Sit, Sit to Supine Rolling: Mod assist Sidelying to sit: Max assist, HOB elevated   Sit to supine: Max assist, +2 for physical assistance   General bed mobility comments: Poor trunk strength and ability to push self upright from sidelying. Assist to lift legs and guide trunk back to bed    Transfers Overall transfer level: Needs assistance Equipment used: Rolling walker (2 wheels) Transfers: Sit to/from Stand Sit to Stand: Mod assist, From elevated surface           General transfer comment: see toilet transfer      Balance Overall balance assessment: Needs assistance Sitting-balance support: Feet supported, Single extremity supported, Bilateral upper extremity supported Sitting balance-Leahy Scale: Poor     Standing balance support: Reliant on assistive device for balance Standing balance-Leahy Scale: Poor                             ADL either performed or assessed with clinical judgement   ADL Overall ADL's : Needs assistance/impaired Eating/Feeding: NPO   Grooming: Min guard;Sitting   Upper Body Bathing: Minimal assistance;Sitting   Lower Body Bathing: Maximal assistance;Sitting/lateral leans;Sit to/from stand   Upper Body Dressing : Minimal assistance;Sitting   Lower Body Dressing: Total assistance;Sitting/lateral leans Lower Body Dressing Details (indicate cue type and reason): To don socks Toilet Transfer: Moderate assistance;Rolling walker (2 wheels) Toilet Transfer Details (indicate cue type and reason): HEAVY mod A to power up to standing from elevated bed height. Patient able to  stand twice at edge of bed but unable to weight shift to attempt side stepping to head of bed Toileting- Clothing  Manipulation and Hygiene: Total assistance;Sitting/lateral lean;Bed level       Functional mobility during ADLs: Moderate assistance General ADL Comments: Patient requiring increased assistance with self care tasks due to weakness, decreased balance, safety      Pertinent Vitals/Pain Pain Assessment Pain Assessment: 0-10 Pain Score: 9  Pain Location: abdomen Pain Descriptors / Indicators: Sharp Pain Intervention(s): Monitored during session     Hand Dominance Right   Extremity/Trunk Assessment Upper Extremity Assessment Upper Extremity Assessment: Generalized weakness   Lower Extremity Assessment Lower Extremity Assessment: Defer to PT evaluation       Communication Communication Communication: No difficulties   Cognition Arousal/Alertness: Lethargic Behavior During Therapy: Flat affect Overall Cognitive Status: No family/caregiver present to determine baseline cognitive functioning                                 General Comments: Patient could not name hospital, when asked if at home or hospital responds "hospital." Could state month/year. Delayed responses at time and keeping eyes closed needing cues for alertness     General Comments  VSS on 3L O2            Home Living Family/patient expects to be discharged to:: Private residence Living Arrangements: Alone Available Help at Discharge: Neighbor;Available PRN/intermittently Type of Home: House Home Access: Stairs to enter [employer] of Steps: 6 Entrance Stairs-Rails: Right Home Layout: One level     Bathroom Shower/Tub: Occupational psychologist: Standard     Home Equipment: Advice worker (2 wheels)          Prior Functioning/Environment Prior Level of Function : Independent/Modified Independent             Mobility Comments: ambulates with walker ADLs Comments: I with BADLs, neighbors assist with transportation        OT Problem List:  Decreased strength;Decreased activity tolerance;Impaired balance (sitting and/or standing);Decreased cognition;Decreased safety awareness;Pain;Obesity      OT Treatment/Interventions: Self-care/ADL training;Therapeutic exercise;Therapeutic activities;Patient/family education;Balance training;Cognitive remediation/compensation;DME and/or AE instruction    OT Goals(Current goals can be found in the care plan section) Acute Rehab OT Goals Patient Stated Goal: Less pain OT Goal Formulation: With patient Time For Goal Achievement: 10/01/21 Potential to Achieve Goals: Good  OT Frequency: Min 2X/week       AM-PAC OT "6 Clicks" Daily Activity     Outcome Measure Help from another person eating meals?: Total Help from another person taking care of personal grooming?: A Little Help from another person toileting, which includes using toliet, bedpan, or urinal?: A Lot Help from another person bathing (including washing, rinsing, drying)?: A Lot Help from another person to put on and taking off regular upper body clothing?: A Little Help from another person to put on and taking off regular lower body clothing?: Total 6 Click Score: 12   End of Session Equipment Utilized During Treatment: Rolling walker (2 wheels);Gait belt;Oxygen Nurse Communication: Mobility status  Activity Tolerance: Patient limited by fatigue Patient left: in bed;with call bell/phone within reach;with bed alarm set  OT Visit Diagnosis: Unsteadiness on feet (R26.81);Other abnormalities of gait and mobility (R26.89);Muscle weakness (generalized) (M62.81);Other symptoms and signs involving cognitive function                Time: 7425-9563 OT Time Calculation (  min): 29 min Charges:  OT General Charges $OT Visit: 1 Visit OT Evaluation $OT Eval Low Complexity: 1 Low OT Treatments $Self Care/Home Management : 8-22 mins  Delbert Phenix OT OT pager: [phone] 09/17/2021, 1:17 PM

## 2021-09-17 NOTE — Progress Notes (Signed)
Called by nursing for evaluation of patient's worsening wheezing and tachypnea. Patient seen and examined at bedside. Patient states she has some dyspnea. She is noted to have abdominal breathing with tachypnea. Audible wheezing heard without auscultation. On auscultation, patient with significant wheezing in upper airway with transmitted sounds. Discussed pursed lip breathing exercise with patient; she was able to somewhat accomplish this with some improvement in wheezing. She also produced a good cough with subsequent improvement of symptoms. Discussed with RRT and will get an ABG at this time. Ordered a flutter valve and will start Mucinex. If symptoms return/worsen, will obtain CT neck/chest. Plan discussed with RRT and nursing at bedside.  Cordelia Poche, MD [facility] 09/17/2021, 5:42 PM

## 2021-09-17 NOTE — Progress Notes (Signed)
PT Cancellation Note  Patient Details Name: SARIAH HENKIN MRN: [id number] DOB: 1941/04/24   Cancelled Treatment:    Reason Eval/Treat Not Completed: Other (comment) Pt at procedure earlier this morning.  Checked with RN in afternoon and pt very groggy from procedure - recommended PT f/u later today.  PT agrees. Abran Richard, PT Acute Rehab Services Pager [phone] [facility] [place] 09/17/2021, 2:19 PM

## 2021-09-17 NOTE — Progress Notes (Signed)
MEDICATION-RELATED CONSULT NOTE   IR Procedure Consult - Anticoagulant/Antiplatelet PTA/Inpatient Med List Review by Pharmacist    Procedure: placement of a 10 Fr drainage catheter placement into the gallbladder lumen.    Completed: 09/17/21 at 13:51  Post-Procedural bleeding risk per IR MD assessment:  standard  Antithrombotic medications on inpatient or PTA profile prior to procedure:       Recommended restart time per IR Post-Procedure Guidelines:   Day + 1 (Next AM)    Other considerations:      Plan:    resume lovenox 40mg  SQ q24h tomorrow 2/4 at [place], PharmD, [place]: [phone] 09/17/2021 1:56 PM

## 2021-09-17 NOTE — Progress Notes (Signed)
Progress Note     Subjective: Pt complaining that she wants something to drink this AM. She denies n/v. She is not passing flatus. She reports persistent RUQ pain. She reports she has chronic respiratory issues but has noticed they've been worse in the last week. She does not currently wear oxygen at home.   Objective: Vital signs in last 24 hours: Temp:  [98.7 F (37.1 C)-100.3 F (37.9 C)] 100 F (37.8 C) (02/03 0925) Pulse Rate:  [73-100] 88 (02/03 0925) Resp:  [18-31] 18 (02/03 0925) BP: (127-173)/(56-99) 127/60 (02/03 0925) SpO2:  [93 %-100 %] 95 % (02/03 0925) FiO2 (%):  [28 %] 28 % (02/03 0719) Weight:  [106.4 kg-108 kg] 106.4 kg (02/03 0537) Last BM Date: 09/16/21  Intake/Output from previous day: 02/02 0701 - 02/03 0700 In: 1115.5 [I.V.:1054.4; IV Piggyback:61.1] Out: 0  Intake/Output this shift: No intake/output data recorded.  PE: General: WD, obese female who is laying in bed and appears uncomfortable HEENT: Sclera are anicteric Heart: regular, rate, and rhythm. Palpable pedal pulses bilaterally Lungs: Tachypnea, rales in bases bilaterally and shallow breaths, on 3L via [place] Abd: soft, TTP in epigastrium and RUQ, ND, BS hypoactive  MS: all 4 extremities are symmetrical with no cyanosis, clubbing, or edema. Skin: warm and dry with no masses, lesions, or rashes Neuro: Cranial nerves 2-12 grossly intact, sensation is normal throughout Psych: A&Ox3 with an appropriate affect.    Lab Results:  Recent Labs    09/16/21 1354 09/17/21 0723  WBC 20.1* 20.7*  HGB 13.6 12.5  HCT 41.2 38.3  PLT 208 181   BMET Recent Labs    09/16/21 1354 09/17/21 0723  NA 136 136  K 3.6 3.5  CL 102 103  CO2 23 23  GLUCOSE 131* 158*  BUN 17 18  CREATININE 0.81 0.99  CALCIUM 9.6 8.7*   PT/INR No results for input(s): LABPROT, INR in the last 72 hours. CMP     Component Value Date/Time   NA 136 09/17/2021 0723   K 3.5 09/17/2021 0723   K 4.2 07/22/2013 1325   CL  103 09/17/2021 0723   CO2 23 09/17/2021 0723   GLUCOSE 158 (H) 09/17/2021 0723   BUN 18 09/17/2021 0723   BUN 26 (A) 08/24/2009 0000   CREATININE 0.99 09/17/2021 0723   CREATININE 1.07 (H) 07/27/2021 1440   CALCIUM 8.7 (L) 09/17/2021 0723   PROT 6.6 09/17/2021 0723   PROT 6.7 10/12/2016 1307   ALBUMIN 3.3 (L) 09/17/2021 0723   AST 19 09/17/2021 0723   ALT 19 09/17/2021 0723   ALKPHOS 60 09/17/2021 0723   BILITOT 1.1 09/17/2021 0723   GFRNONAA 58 (L) 09/17/2021 0723   GFRNONAA 39 (L) 11/13/2020 1438   GFRAA 45 (L) 11/13/2020 1438   Lipase     Component Value Date/Time   LIPASE 28 09/16/2021 1354       Studies/Results: CT ABDOMEN PELVIS W CONTRAST  Result Date: 09/16/2021 CLINICAL DATA:  Acute abdominal pain. Right-sided pain and vomiting for 2 days EXAM: CT ABDOMEN AND PELVIS WITH CONTRAST TECHNIQUE: Multidetector CT imaging of the abdomen and pelvis was performed using the standard protocol following bolus administration of intravenous contrast. RADIATION DOSE REDUCTION: This exam was performed according to the departmental dose-optimization program which includes automated exposure control, adjustment of the mA and/or kV according to patient size and/or use of iterative reconstruction technique. CONTRAST:  155mL OMNIPAQUE IOHEXOL 300 MG/ML  SOLN COMPARISON:  None. FINDINGS: Lower chest: No acute abnormality.  Hepatobiliary: Diffuse low attenuation of the liver as can be seen with hepatic steatosis. No hepatic mass. No intrahepatic or extrahepatic biliary ductal dilatation. Distended gallbladder with cholelithiasis and pericholecystic inflammatory changes as can be seen with acute cholecystitis. Pancreas: Unremarkable. No pancreatic ductal dilatation or surrounding inflammatory changes. Spleen: Normal in size without focal abnormality. Adrenals/Urinary Tract: Normal adrenal glands. 4.5 cm right interpolar fluid attenuating renal mass consistent with a cyst. No urolithiasis or obstructive  uropathy. Normal bladder. Stomach/Bowel: Moderate size hiatal hernia. Stomach is otherwise unremarkable. Appendix appears normal. No evidence of bowel wall thickening, distention, or inflammatory changes. Incidental note made of a duodenal diverticulum. Diverticulosis without evidence of diverticulitis. Vascular/Lymphatic: Aortic atherosclerosis. No enlarged abdominal or pelvic lymph nodes. Reproductive: Uterus and bilateral adnexa are unremarkable. Other: No abdominal wall hernia or abnormality. No abdominopelvic ascites. Musculoskeletal: No acute osseous abnormality. Chronic T12 and L4 vertebral body compression fractures. Anterior bridging osteophytes throughout the thoracolumbar spine as can be seen with diffuse idiopathic skeletal hyperostosis. Degenerative disease with disc height loss at L3-4, L4-5 and L5-S1. Severe bilateral facet arthropathy at L3-4, L4-5 and L5-S1. Severe bilateral foraminal stenosis at L3-4. Severe right foraminal stenosis at L4-5. Severe right foraminal stenosis at L5-S1. Moderate left foraminal stenosis at L5-S1. IMPRESSION: 1. Distended gallbladder with cholelithiasis and pericholecystic inflammatory changes as can be seen with acute cholecystitis. 2. Hepatic steatosis. 3. Moderate size hiatal hernia. 4. Diverticulosis without evidence of diverticulitis. 5. Diffuse lumbar spine spondylosis as described above. 6. Aortic Atherosclerosis (ICD10-I70.0). Electronically Signed   By: Kathreen Devoid M.D.   On: 09/16/2021 15:17   DG CHEST PORT 1 VIEW  Result Date: 09/17/2021 CLINICAL DATA:  Tachypnea EXAM: PORTABLE CHEST 1 VIEW COMPARISON:  Chest radiograph 09/16/2021 FINDINGS: The heart is mildly enlarged, unchanged. The upper mediastinal contours are normal. There is no focal consolidation or pulmonary edema. There is no pleural effusion or pneumothorax. There is unchanged asymmetric elevation of the right hemidiaphragm. The bones are stable. IMPRESSION: Stable chest with no radiographic  evidence of acute cardiopulmonary process. Electronically Signed   By: Valetta Mole M.D.   On: 09/17/2021 08:13   DG CHEST PORT 1 VIEW  Result Date: 09/16/2021 CLINICAL DATA:  Acute abdominal pain. EXAM: PORTABLE CHEST 1 VIEW COMPARISON:  July 22, 2021. FINDINGS: Stable cardiomediastinal silhouette. Both lungs are clear. The visualized skeletal structures are unremarkable. IMPRESSION: No active disease. Electronically Signed   By: Marijo Conception M.D.   On: 09/16/2021 16:56    Anti-infectives: Anti-infectives (From admission, onward)    Start     Dose/Rate Route Frequency Ordered Stop   09/16/21 2300  piperacillin-tazobactam (ZOSYN) IVPB 3.375 g        3.375 g 12.5 mL/hr over 240 Minutes Intravenous Every 8 hours 09/16/21 1634     09/16/21 1645  piperacillin-tazobactam (ZOSYN) IVPB 3.375 g        3.375 g 100 mL/hr over 30 Minutes Intravenous STAT 09/16/21 1634 09/16/21 1746        Assessment/Plan Acute Cholecystitis  - CT 2/2 with above  - WBC stable at 20, Tmax 100.3, very ttp in RUQ on exam  - LFTs all still normal - IR consulted for percutaneous cholecystostomy, potentially planning for later today  - we will continue to follow, will likely need interval cholecystectomy in 6-8 weeks which will also allow for medical optimization prior to surgical intervention    FEN - NPO, IVF per [facility] - ok to have CLD after procedure  VTE - SCDs,  okay for chemical prophylaxis from a general surgery standpoint ID - Zosyn   SOB - patient is very short of breath and tachypneic on exam. Still has some rales at the lung bases b/l. CXR without active disease, on 3L via [place] this AM Hx CHF (last EF 2019 w/ EF 45-50% w/ diffuse hypokinesis, NM study in 2020 with EF ~61%) PFO HTN  HLD  LOS: 0 days   I reviewed hospitalist notes, last 24 h vitals and pain scores, last 48 h intake and output, last 24 h labs and trends, and last 24 h imaging results.  This care required moderate level of medical  decision making.    Norm Parcel, [facility] 09/17/2021, 10:03 AM Please see Amion for pager number during day hours 7:00am-4:30pm

## 2021-09-17 NOTE — Consult Note (Signed)
Chief Complaint: Acute cholecystitis  Referring Physician(s): [place]  Supervising Physician: Sandi Mariscal  Patient Status: [facility] - In-pt  History of Present Illness: Virginia Walters is a 81 y.o. female with COPD, hypertension, and chronic systolic heart failure.  She presented to the hospital with abdominal pain and one episode of vomiting yesterday.  CT scan done yesterday showed= Distended gallbladder with cholelithiasis and pericholecystic inflammatory changes as can be seen with acute cholecystitis.  Past Medical History:  Diagnosis Date   CHF (congestive heart failure) (HCC)    Dyslipidemia    Frequent falls    GERD (gastroesophageal reflux disease)    HTN (hypertension)    Memory loss    PFO (patent foramen ovale)    Retinal artery occlusion    Weakness     Past Surgical History:  Procedure Laterality Date   ANKLE ARTHROSCOPY WITH OPEN REDUCTION INTERNAL FIXATION (ORIF) Right    APPENDECTOMY     COLONOSCOPY N/A 07/21/2018   Procedure: COLONOSCOPY;  Surgeon: Lin Landsman, MD;  Location: [place];  Service: Gastroenterology;  Laterality: N/A;   COLONOSCOPY WITH PROPOFOL N/A 04/01/2020   Procedure: COLONOSCOPY WITH PROPOFOL;  Surgeon: Virgel Manifold, MD;  Location: [facility] ENDOSCOPY;  Service: Endoscopy;  Laterality: N/A;   ESOPHAGOGASTRODUODENOSCOPY N/A 07/21/2018   Procedure: ESOPHAGOGASTRODUODENOSCOPY (EGD);  Surgeon: Lin Landsman, MD;  Location: [facility] ENDOSCOPY;  Service: Gastroenterology;  Laterality: N/A;   ESOPHAGOGASTRODUODENOSCOPY N/A 02/11/2020   Procedure: ESOPHAGOGASTRODUODENOSCOPY (EGD);  Surgeon: Virgel Manifold, MD;  Location: [facility] ENDOSCOPY;  Service: Endoscopy;  Laterality: N/A;   ESOPHAGOGASTRODUODENOSCOPY (EGD) WITH PROPOFOL N/A 04/01/2020   Procedure: ESOPHAGOGASTRODUODENOSCOPY (EGD) WITH PROPOFOL;  Surgeon: Virgel Manifold, MD;  Location: [facility] ENDOSCOPY;  Service: Endoscopy;  Laterality: N/A;   TUBAL  LIGATION      Allergies: Oxycodone  Medications: Prior to Admission medications   Medication Sig Start Date End Date Taking? Authorizing Provider  B Complex-Biotin-FA (B-COMPLEX PO) Take 1 capsule by mouth daily.    Yes [provider]  Calcium Carbonate-Vitamin D (CALCIUM + D PO) Take 1 capsule by mouth 2 (two) times daily.   Yes [provider]  Cholecalciferol (VITAMIN D) 2000 UNITS CAPS Take 1 capsule by mouth daily.   Yes [provider]  dorzolamide (TRUSOPT) 2 % ophthalmic solution Place 1 drop into the left eye 2 (two) times daily.   Yes [provider]  doxazosin (CARDURA) 4 MG tablet TAKE ONE-HALF TABLET BY  MOUTH DAILY Patient taking differently: Take 2 mg by mouth daily. 05/31/21  Yes Susy Frizzle, MD  ferrous sulfate 325 (65 FE) MG EC tablet Take 1 tablet (325 mg total) by mouth 2 (two) times daily. 02/21/20 09/16/21 Yes Susy Frizzle, MD  fish oil-omega-3 fatty acids 1000 MG capsule Take 1,000 mg by mouth daily.   Yes [provider]  hydrochlorothiazide (HYDRODIURIL) 25 MG tablet Take 1 tablet (25 mg total) by mouth daily. 03/05/20  Yes Susy Frizzle, MD  latanoprost (XALATAN) 0.005 % ophthalmic solution Place 1 drop into the left eye at bedtime.  12/05/17  Yes [provider]  lisinopril (ZESTRIL) 40 MG tablet TAKE 1 TABLET BY MOUTH  DAILY Patient taking differently: Take 40 mg by mouth daily. 06/07/21  Yes Susy Frizzle, MD  magnesium 30 MG tablet Take 30 mg by mouth 2 (two) times daily.   Yes [provider]  melatonin 5 MG TABS Take 5 mg by mouth at bedtime.  Yes [provider]  Multiple Vitamin (MULTIVITAMIN) tablet Take 1 tablet by mouth daily.   Yes [provider]  potassium chloride (KLOR-CON) 10 MEQ tablet TAKE 1 TABLET BY MOUTH  DAILY Patient taking differently: 10 mEq daily. 11/11/20  Yes Susy Frizzle, MD  pravastatin (PRAVACHOL) 40 MG tablet TAKE 1 TABLET BY MOUTH   DAILY Patient taking differently: Take 40 mg by mouth daily. 12/30/20  Yes Susy Frizzle, MD  traZODone (DESYREL) 50 MG tablet TAKE 1 TABLET BY MOUTH AT  BEDTIME Patient taking differently: Take 50 mg by mouth at bedtime. 01/28/21  Yes Susy Frizzle, MD  Turmeric 500 MG CAPS Take 500 mg by mouth daily.    Yes [provider]  vitamin E 180 MG (400 UNITS) capsule Take 400 Units by mouth daily.   Yes [provider]  SYMBICORT 160-4.5 MCG/ACT inhaler USE 2 INHALATIONS BY MOUTH  TWICE DAILY Patient not taking: Reported on 09/16/2021 03/04/20   Susy Frizzle, MD  venlafaxine XR (EFFEXOR-XR) 75 MG 24 hr capsule TAKE 1 CAPSULE BY MOUTH  DAILY WITH BREAKFAST 04/09/21   Susy Frizzle, MD  verapamil (CALAN-SR) 240 MG CR tablet TAKE 1 TABLET BY MOUTH AT  BEDTIME 07/05/21   Susy Frizzle, MD     Family History  Problem Relation Age of Onset   Esophageal cancer Father    Dementia Mother     Social History   Socioeconomic History   Marital status: Widowed    Spouse name: Not on file   Number of children: 1   Years of education: HS   Highest education level: Not on file  Occupational History   Occupation: Retired  Tobacco Use   Smoking status: Former    Packs/day: 1.00    Years: 15.00    Pack years: 15.00    Types: Cigarettes    Quit date: 12/27/1980    Years since quitting: 40.7   Smokeless tobacco: Never  Vaping Use   Vaping Use: Never used  Substance and Sexual Activity   Alcohol use: Yes    Comment: occassionally   Drug use: No   Sexual activity: Not Currently  Other Topics Concern   Not on file  Social History Narrative   Lives at home alone. Husband passed away 11-15-2012.   Right-handed.   No daily use of caffeine.   Daughter lives in [place].   Social Determinants of Health   Financial Resource Strain: Low Risk    Difficulty of Paying Living Expenses: Not hard at all  Food Insecurity: No Food Insecurity   Worried About Charity fundraiser in  the Last Year: Never true   [place] in the Last Year: Never true  Transportation Needs: No Transportation Needs   Lack of Transportation (Medical): No   Lack of Transportation (Non-Medical): No  Physical Activity: Inactive   Days of Exercise per Week: 0 days   Minutes of Exercise per Session: 0 min  Stress: No Stress Concern Present   Feeling of Stress : Not at all  Social Connections: Moderately Integrated   Frequency of Communication with Friends and Family: Three times a week   Frequency of Social Gatherings with Friends and Family: Three times a week   Attends Religious Services: More than 4 times per year   Active Member of Clubs or Organizations: Yes   Attends Archivist Meetings: More than 4 times per year   Marital Status: Widowed  Review of Systems: A 12 point ROS discussed and pertinent positives are indicated in the HPI above.  All other systems are negative.  Review of Systems  Vital Signs: BP (!) 147/81 (BP Location: Right Arm)    Pulse 86    Temp 98.9 F (37.2 C) (Oral)    Resp (!) 25    Ht 5\' 5"  (1.651 m)    Wt 234 lb 9.1 oz (106.4 kg)    SpO2 93%    BMI 39.03 kg/m   Physical Exam Vitals reviewed.  Constitutional:      Appearance: Normal appearance.  HENT:     Head: Normocephalic and atraumatic.  Eyes:     Extraocular Movements: Extraocular movements intact.  Cardiovascular:     Rate and Rhythm: Normal rate and regular rhythm.  Pulmonary:     Breath sounds: Wheezing and rhonchi present.  Abdominal:     Palpations: Abdomen is soft.     Tenderness: There is abdominal tenderness. There is guarding.  Musculoskeletal:        General: Normal range of motion.     Cervical back: Normal range of motion.  Skin:    General: Skin is warm and dry.  Neurological:     General: No focal deficit present.     Mental Status: She is alert and oriented to person, place, and time.  Psychiatric:        Mood and Affect: Mood normal.        Behavior:  Behavior normal.        Thought Content: Thought content normal.        Judgment: Judgment normal.    Imaging: MR Lumbar Spine Wo Contrast  Result Date: 08/20/2021 CLINICAL DATA:  Lumbar radiculopathy. Symptoms persist for over 6 weeks. Low back pain that radiates down the right leg to the knee for 2 years EXAM: MRI LUMBAR SPINE WITHOUT CONTRAST TECHNIQUE: Multiplanar, multisequence MR imaging of the lumbar spine was performed. No intravenous contrast was administered. COMPARISON:  08/12/2016 FINDINGS: Segmentation: 5 lumbar type vertebrae based on the available coverage Alignment:  Mild dextroscoliosis and L4-5 anterolisthesis Vertebrae: Remote L4 superior endplate fracture with further height loss since prior, but no marrow edema. No evidence of aggressive bone lesion. Conus medullaris and cauda equina: Conus extends to the L1 level. Conus and cauda equina appear normal. Paraspinal and other soft tissues: Negative for perispinal mass or inflammation. Disc levels: T12- L1: Ventral spondylosis. L1-L2: Ventral spondylosis. L2-L3: Disc bulging and ventral spondylitic spurring. Small right foraminal protrusion L3-L4: Disc narrowing and bulging with upward pointing protrusion. Fluid-filled cleft in the disc space. Degenerative facet spurring and ligamentum flavum thickening. Moderate to advanced spinal stenosis with cauda equina flattening. Moderate bilateral foraminal stenosis L4-L5: Disc narrowing and bulging with endplate spurring eccentric to the right. Degenerative facet spurring on the right more than left. High-grade spinal stenosis. Moderate right foraminal impingement with mild L4 root deformity L5-S1:Disc narrowing and bulging with endplate and facet spurring. IMPRESSION: 1. Generalized lumbar spine degeneration with mild scoliosis and L4-5 anterolisthesis. 2. Chronic deformity at the remote L4 superior endplate fracture with large fluid-filled cleft in the L3-4 disc space. 3. Compressive spinal  stenosis at L3-4 and L4-5. 4. Moderate foraminal impingement bilaterally at L3-4 and on the right at L4-5. Electronically Signed   By: Jorje Guild M.D.   On: 08/20/2021 13:28   CT ABDOMEN PELVIS W CONTRAST  Result Date: 09/16/2021 CLINICAL DATA:  Acute abdominal pain. Right-sided pain and vomiting for 2 days  EXAM: CT ABDOMEN AND PELVIS WITH CONTRAST TECHNIQUE: Multidetector CT imaging of the abdomen and pelvis was performed using the standard protocol following bolus administration of intravenous contrast. RADIATION DOSE REDUCTION: This exam was performed according to the departmental dose-optimization program which includes automated exposure control, adjustment of the mA and/or kV according to patient size and/or use of iterative reconstruction technique. CONTRAST:  174mL OMNIPAQUE IOHEXOL 300 MG/ML  SOLN COMPARISON:  None. FINDINGS: Lower chest: No acute abnormality. Hepatobiliary: Diffuse low attenuation of the liver as can be seen with hepatic steatosis. No hepatic mass. No intrahepatic or extrahepatic biliary ductal dilatation. Distended gallbladder with cholelithiasis and pericholecystic inflammatory changes as can be seen with acute cholecystitis. Pancreas: Unremarkable. No pancreatic ductal dilatation or surrounding inflammatory changes. Spleen: Normal in size without focal abnormality. Adrenals/Urinary Tract: Normal adrenal glands. 4.5 cm right interpolar fluid attenuating renal mass consistent with a cyst. No urolithiasis or obstructive uropathy. Normal bladder. Stomach/Bowel: Moderate size hiatal hernia. Stomach is otherwise unremarkable. Appendix appears normal. No evidence of bowel wall thickening, distention, or inflammatory changes. Incidental note made of a duodenal diverticulum. Diverticulosis without evidence of diverticulitis. Vascular/Lymphatic: Aortic atherosclerosis. No enlarged abdominal or pelvic lymph nodes. Reproductive: Uterus and bilateral adnexa are unremarkable. Other: No  abdominal wall hernia or abnormality. No abdominopelvic ascites. Musculoskeletal: No acute osseous abnormality. Chronic T12 and L4 vertebral body compression fractures. Anterior bridging osteophytes throughout the thoracolumbar spine as can be seen with diffuse idiopathic skeletal hyperostosis. Degenerative disease with disc height loss at L3-4, L4-5 and L5-S1. Severe bilateral facet arthropathy at L3-4, L4-5 and L5-S1. Severe bilateral foraminal stenosis at L3-4. Severe right foraminal stenosis at L4-5. Severe right foraminal stenosis at L5-S1. Moderate left foraminal stenosis at L5-S1. IMPRESSION: 1. Distended gallbladder with cholelithiasis and pericholecystic inflammatory changes as can be seen with acute cholecystitis. 2. Hepatic steatosis. 3. Moderate size hiatal hernia. 4. Diverticulosis without evidence of diverticulitis. 5. Diffuse lumbar spine spondylosis as described above. 6. Aortic Atherosclerosis (ICD10-I70.0). Electronically Signed   By: Kathreen Devoid M.D.   On: 09/16/2021 15:17   DG CHEST PORT 1 VIEW  Result Date: 09/17/2021 CLINICAL DATA:  Tachypnea EXAM: PORTABLE CHEST 1 VIEW COMPARISON:  Chest radiograph 09/16/2021 FINDINGS: The heart is mildly enlarged, unchanged. The upper mediastinal contours are normal. There is no focal consolidation or pulmonary edema. There is no pleural effusion or pneumothorax. There is unchanged asymmetric elevation of the right hemidiaphragm. The bones are stable. IMPRESSION: Stable chest with no radiographic evidence of acute cardiopulmonary process. Electronically Signed   By: Valetta Mole M.D.   On: 09/17/2021 08:13   DG CHEST PORT 1 VIEW  Result Date: 09/16/2021 CLINICAL DATA:  Acute abdominal pain. EXAM: PORTABLE CHEST 1 VIEW COMPARISON:  July 22, 2021. FINDINGS: Stable cardiomediastinal silhouette. Both lungs are clear. The visualized skeletal structures are unremarkable. IMPRESSION: No active disease. Electronically Signed   By: Marijo Conception M.D.   On:  09/16/2021 16:56    Labs:  CBC: Recent Labs    07/22/21 1854 07/22/21 1903 07/27/21 1440 09/16/21 1354 09/17/21 0723  WBC 7.3  --  6.7 20.1* 20.7*  HGB 14.3 13.9   14.3 14.9 13.6 12.5  HCT 42.1 41.0   42.0 44.2 41.2 38.3  PLT 185  --  252 208 181    COAGS: Recent Labs    07/22/21 1854  INR 1.1  APTT 31    BMP: Recent Labs    11/13/20 1438 11/13/20 1438 07/22/21 1854 07/22/21 1903 07/27/21 1440 09/16/21  1354 09/17/21 0723  NA 141  --  136 139   138 141 136 136  K 4.7  --  3.5 3.6   3.5 4.2 3.6 3.5  CL 107  --  104 103 106 102 103  CO2 23  --  23  --  23 23 23   GLUCOSE 112*  --  145* 144* 102* 131* 158*  BUN 24  --  11 12 18 17 18   CALCIUM 9.9  --  9.6  --  9.4 9.6 8.7*  CREATININE 1.31*   < > 1.17* 1.10* 1.07* 0.81 0.99  GFRNONAA 39*  --  47*  --   --  >60 58*  GFRAA 45*  --   --   --   --   --   --    < > = values in this interval not displayed.    LIVER FUNCTION TESTS: Recent Labs    07/22/21 1854 07/27/21 1440 09/16/21 1354 09/17/21 0723  BILITOT 0.8 0.4 0.8 1.1  AST 22 32 16 19  ALT 25 36* 19 19  ALKPHOS 68  --  54 60  PROT 6.7 6.7 7.5 6.6  ALBUMIN 3.5  --  3.9 3.3*    TUMOR MARKERS: No results for input(s): AFPTM, CEA, CA199, CHROMGRNA in the last 8760 hours.  Assessment and Plan:  Acute cholecystitis  General Surgery feels she is not a good candidate for cholecystectomy at this time due to respiratory issues.  Will proceed with image guided cholecystostomy tube placement today by Dr. Pascal Lux.  Risks and benefits discussed with the patient including, but not limited to bleeding, infection, gallbladder perforation, bile leak, sepsis or even death.  All of the patient's questions were answered, patient is agreeable to proceed. Consent signed and in chart.  Thank you for allowing our service to participate in NEGIN HEGG 's care.  Electronically Signed: Melody Redden, PA-C   09/17/2021, 9:19 AM      I spent a total of 40  Minutes  in face to face in clinical consultation, greater than 50% of which was counseling/coordinating care for perc chole.

## 2021-09-17 NOTE — Progress Notes (Signed)
Patient given a prn Duoneb treatment due to the expiatory wheezes and rales. She also had a prn nebulizer treatment when in IR for procedure. Patient tolerated but still had rales. Her vitals are good on 4lpm [place] with a saturation of 95% RT will continue to monitor

## 2021-09-17 NOTE — Assessment & Plan Note (Addendum)
Patient with worsening wheezing and tachypnea. No cough or sputum production. Wheezing seems more upper airway than lower. Completed 5 days of Solu-medrol. PT/OT recommending SNF. Resolved. Continue Duonebs on discharge. Continue previously prescribed Symbicort.

## 2021-09-17 NOTE — Progress Notes (Addendum)
[facility] protocol was initiated because patient's RR is 31, she was wheezing and having labored breathing, scheduled nebulizer treatment was administered, O2 is 95% on 2 liters of O2. BP is elevated 165/99. After nebulizer treatment wheezing diminished. NP Olena Heckle was notified, no new orders were given.

## 2021-09-17 NOTE — Progress Notes (Signed)
PROGRESS NOTE    Virginia Walters  IHK:[id number] DOB: 07/18/1941 DOA: 09/16/2021 PCP: Susy Frizzle, MD   Brief Narrative: Virginia Walters is a 81 y.o. female with a history of COPD, chronic heart failure, memory loss vs dementia. Patient presented from her PCP's office secondary to abdominal pain with evidence of acute cholecystitis on CT imaging. General surgery consulted who recommended IR consult for percutaneous cholecystostomy. Patient with associated tachypnea and likely COPD exacerbation.   Assessment and Plan: * Acute cholecystitis Diagnosed on CT abdomen/pelvis. No evidence of choledocholithiasis on imaging. ALP, AST, ALT wnl. Associated leukocytosis. Patient with continued abdominal pain which has improved with IV analgesics. General surgery consulted and have recommended IR consult for percutaneous cholecystostomy. -Follow-up IR recommendations: pending -Morphine prn -NPO -Blood cultures pending -CMP/CBC pending -Continue Zosyn  Tachypnea Possibly related to COPD exacerbation as patient has developed worsening wheezing. Patient reports chronic respiratory issues but states this tachypnea is a bit worse than her baseline. No associated tachycardia or fluid overload. Chest x-ray looks clear. Patient placed on oxygen, but no actual documentation of hypoxia. -See problem, COPD exacerbation   COPD exacerbation ([place]) Patient with worsening wheezing and tachypnea. No cough or sputum production. -Scheduled Duonebs -Solu-medrol  COPD with chronic bronchitis ([place])- (present on admission) Patient has previously required oxygen which has been discontinued. Patient was prescribed Symbicort as an outpatient but is currently not using. -See problem, COPD exacerbation  Chronic systolic heart failure ([place])- (present on admission) Euvolemic. Patient's last LVEF mildly reduced at 45-50% in 2019. On lisinopril. Not on diuretic therapy. Noted that patient is on verapamil for blood  pressure control. Weight on admission of 238.1 lbs (108 kg). BNP slightly elevated. Weight down to 234.6 lbs today -Daily weights, strict in out  Chronic kidney disease, stage 3a ([facility]) Creatinine stable and at baseline.  Dementia with behavioral problem- (present on admission) Noted on history. Patient lives alone with minimal support. -PT/OT/TOC -Delirium precautions  GERD (gastroesophageal reflux disease)- (present on admission) Patient is on Protonix as an outpatient. Held secondary to NPO status currently.  Dyslipidemia- (present on admission) Patient is on Pravastatin as an outpatient. Held secondary to NPO status currently.  HTN (hypertension)- (present on admission) Patient is on verapamil, lisinopril, hydrochlorothiazide and doxazosin which are held secondary to NPO status. Slightly hypertensive currently. -Continue hydralazine IV prn while NPO    DVT prophylaxis: Lovenox Code Status:   Code Status: Full Code Family Communication: None at bedside Disposition Plan: Discharge home likely in 3-5 days pending percutaneous drain placement, improvement of tachypnea, general surgery/IR recommendations   Consultants:  General surgery Interventional radiology  Procedures:  None  Antimicrobials: Zosyn    Subjective: Patient with some mild dyspnea. No chest pain. No cough.  Objective: Vitals:   09/17/21 1235 09/17/21 1240 09/17/21 1245 09/17/21 1250  BP: (!) 144/59 139/62 131/64 (!) 144/68  Pulse: 79 80 80 83  Resp: (!) 31 (!) 31 (!) 22 (!) 37  Temp:      TempSrc:      SpO2: 94% 94% 94% 96%  Weight:      Height:        Intake/Output Summary (Last 24 hours) at 09/17/2021 1315 Last data filed at 09/17/2021 1000 Gross per 24 hour  Intake 1115.46 ml  Output 0 ml  Net 1115.46 ml   Filed Weights   09/16/21 1543 09/17/21 0537  Weight: 108 kg 106.4 kg    Examination:  General exam: Appears calm  Respiratory system: Diffuse  wheezing. Slightly increased  respiratory effort. Tachypnea. Cardiovascular system: S1 & S2 heard, RRR. No murmurs, rubs, gallops or clicks. Gastrointestinal system: Abdomen is nondistended, soft and nontender. No organomegaly or masses felt. Normal bowel sounds heard. Central nervous system: Alert and oriented. No focal neurological deficits. Musculoskeletal: No edema. No calf tenderness Skin: No cyanosis. No rashes Psychiatry: Judgement and insight appear normal. Mood & affect appropriate.     Data Reviewed: I have personally reviewed following labs and imaging studies  CBC Lab Results  Component Value Date   WBC 20.7 (H) 09/17/2021   RBC 4.10 09/17/2021   HGB 12.5 09/17/2021   HCT 38.3 09/17/2021   MCV 93.4 09/17/2021   MCH 30.5 09/17/2021   PLT 181 09/17/2021   MCHC 32.6 09/17/2021   RDW 12.7 09/17/2021   LYMPHSABS 1.3 09/16/2021   MONOABS 1.9 (H) 09/16/2021   EOSABS 0.0 09/16/2021   BASOSABS 0.0 21/30/8657     Last metabolic panel Lab Results  Component Value Date   NA 136 09/17/2021   K 3.5 09/17/2021   CL 103 09/17/2021   CO2 23 09/17/2021   BUN 18 09/17/2021   CREATININE 0.99 09/17/2021   GLUCOSE 158 (H) 09/17/2021   GFRNONAA 58 (L) 09/17/2021   GFRAA 45 (L) 11/13/2020   CALCIUM 8.7 (L) 09/17/2021   PHOS 3.6 08/01/2018   PROT 6.6 09/17/2021   ALBUMIN 3.3 (L) 09/17/2021   LABGLOB 3.2 10/12/2016   BILITOT 1.1 09/17/2021   ALKPHOS 60 09/17/2021   AST 19 09/17/2021   ALT 19 09/17/2021   ANIONGAP 10 09/17/2021    CBG (last 3)  No results for input(s): GLUCAP in the last 72 hours.   GFR: Estimated Creatinine Clearance: 54.9 mL/min (by C-G formula based on SCr of 0.99 mg/dL).  Coagulation Profile: Recent Labs  Lab 09/17/21 0940  INR 1.4*    Recent Results (from the past 240 hour(s))  Resp Panel by RT-PCR (Flu A&B, Covid) Nasopharyngeal Swab     Status: None   Collection Time: 09/16/21  3:29 PM   Specimen: Nasopharyngeal Swab; Nasopharyngeal(NP) swabs in vial transport medium   Result Value Ref Range Status   SARS Coronavirus 2 by RT PCR NEGATIVE NEGATIVE Final    Comment: (NOTE) SARS-CoV-2 target nucleic acids are NOT DETECTED.  The SARS-CoV-2 RNA is generally detectable in upper respiratory specimens during the acute phase of infection. The lowest concentration of SARS-CoV-2 viral copies this assay can detect is 138 copies/mL. A negative result does not preclude SARS-Cov-2 infection and should not be used as the sole basis for treatment or other patient management decisions. A negative result may occur with  improper specimen collection/handling, submission of specimen other than nasopharyngeal swab, presence of viral mutation(s) within the areas targeted by this assay, and inadequate number of viral copies(<138 copies/mL). A negative result must be combined with clinical observations, patient history, and epidemiological information. The expected result is Negative.  Fact Sheet for Patients:  EntrepreneurPulse.com.au  Fact Sheet for Healthcare Providers:  IncredibleEmployment.be  This test is no t yet approved or cleared by the [place] FDA and  has been authorized for detection and/or diagnosis of SARS-CoV-2 by FDA under an Emergency Use Authorization (EUA). This EUA will remain  in effect (meaning this test can be used) for the duration of the COVID-19 declaration under Section 564(b)(1) of the Act, 21 U.S.C.section 360bbb-3(b)(1), unless the authorization is terminated  or revoked sooner.       Influenza A by PCR NEGATIVE NEGATIVE  Final   Influenza B by PCR NEGATIVE NEGATIVE Final    Comment: (NOTE) The Xpert Xpress SARS-CoV-2/FLU/RSV plus assay is intended as an aid in the diagnosis of influenza from Nasopharyngeal swab specimens and should not be used as a sole basis for treatment. Nasal washings and aspirates are unacceptable for Xpert Xpress SARS-CoV-2/FLU/RSV testing.  Fact Sheet for  Patients: EntrepreneurPulse.com.au  Fact Sheet for Healthcare Providers: IncredibleEmployment.be  This test is not yet approved or cleared by the [place] FDA and has been authorized for detection and/or diagnosis of SARS-CoV-2 by FDA under an Emergency Use Authorization (EUA). This EUA will remain in effect (meaning this test can be used) for the duration of the COVID-19 declaration under Section 564(b)(1) of the Act, 21 U.S.C. section 360bbb-3(b)(1), unless the authorization is terminated or revoked.  Performed at [facility], [facility] [address] [place], [place] [address]   Culture, blood (routine x 2)     Status: None (Preliminary result)   Collection Time: 09/16/21  6:20 PM   Specimen: BLOOD RIGHT FOREARM  Result Value Ref Range Status   Specimen Description   Final    BLOOD RIGHT FOREARM BLOOD Performed at [facility] [address] [place], [place] [address]    Special Requests   Final    BOTTLES DRAWN AEROBIC AND ANAEROBIC Blood Culture adequate volume Performed at [facility] [address] [place], [place] [address]    Culture   Final    NO GROWTH < 12 HOURS Performed at [facility] [address], [place], [place] [address]    Report Status PENDING  Incomplete  Culture, blood (routine x 2)     Status: None (Preliminary result)   Collection Time: 09/16/21  6:24 PM   Specimen: BLOOD  Result Value Ref Range Status   Specimen Description   Final    BLOOD RIGHT ANTECUBITAL Performed at [facility] [address] [place], [place] [address]    Special Requests   Final    BOTTLES DRAWN AEROBIC AND ANAEROBIC Blood Culture adequate volume Performed at [facility] [address], [place], [place] [address]    Culture   Final    NO GROWTH < 12 HOURS Performed at [facility] [address], [place],   [address]    Report Status PENDING  Incomplete        Radiology Studies: CT ABDOMEN PELVIS W CONTRAST  Result Date: 09/16/2021 CLINICAL DATA:  Acute abdominal pain. Right-sided pain and vomiting for 2 days EXAM: CT ABDOMEN AND PELVIS WITH CONTRAST TECHNIQUE: Multidetector CT imaging of the abdomen and pelvis was performed using the standard protocol following bolus administration of intravenous contrast. RADIATION DOSE REDUCTION: This exam was performed according to the departmental dose-optimization program which includes automated exposure control, adjustment of the mA and/or kV according to patient size and/or use of iterative reconstruction technique. CONTRAST:  179mL OMNIPAQUE IOHEXOL 300 MG/ML  SOLN COMPARISON:  None. FINDINGS: Lower chest: No acute abnormality. Hepatobiliary: Diffuse low attenuation of the liver as can be seen with hepatic steatosis. No hepatic mass. No intrahepatic or extrahepatic biliary ductal dilatation. Distended gallbladder with cholelithiasis and pericholecystic inflammatory changes as can be seen with acute cholecystitis. Pancreas: Unremarkable. No pancreatic ductal dilatation or surrounding inflammatory changes. Spleen: Normal in size without focal abnormality. Adrenals/Urinary Tract: Normal adrenal glands. 4.5 cm right interpolar fluid attenuating renal mass consistent with a cyst. No urolithiasis or obstructive  uropathy. Normal bladder. Stomach/Bowel: Moderate size hiatal hernia. Stomach is otherwise unremarkable. Appendix appears normal. No evidence of bowel wall thickening, distention, or inflammatory changes. Incidental note made of a duodenal diverticulum. Diverticulosis without evidence of diverticulitis. Vascular/Lymphatic: Aortic atherosclerosis. No enlarged abdominal or pelvic lymph nodes. Reproductive: Uterus and bilateral adnexa are unremarkable. Other: No abdominal wall hernia or abnormality. No abdominopelvic ascites. Musculoskeletal: No acute osseous abnormality.  Chronic T12 and L4 vertebral body compression fractures. Anterior bridging osteophytes throughout the thoracolumbar spine as can be seen with diffuse idiopathic skeletal hyperostosis. Degenerative disease with disc height loss at L3-4, L4-5 and L5-S1. Severe bilateral facet arthropathy at L3-4, L4-5 and L5-S1. Severe bilateral foraminal stenosis at L3-4. Severe right foraminal stenosis at L4-5. Severe right foraminal stenosis at L5-S1. Moderate left foraminal stenosis at L5-S1. IMPRESSION: 1. Distended gallbladder with cholelithiasis and pericholecystic inflammatory changes as can be seen with acute cholecystitis. 2. Hepatic steatosis. 3. Moderate size hiatal hernia. 4. Diverticulosis without evidence of diverticulitis. 5. Diffuse lumbar spine spondylosis as described above. 6. Aortic Atherosclerosis (ICD10-I70.0). Electronically Signed   By: Kathreen Devoid M.D.   On: 09/16/2021 15:17   DG CHEST PORT 1 VIEW  Result Date: 09/17/2021 CLINICAL DATA:  Tachypnea EXAM: PORTABLE CHEST 1 VIEW COMPARISON:  Chest radiograph 09/16/2021 FINDINGS: The heart is mildly enlarged, unchanged. The upper mediastinal contours are normal. There is no focal consolidation or pulmonary edema. There is no pleural effusion or pneumothorax. There is unchanged asymmetric elevation of the right hemidiaphragm. The bones are stable. IMPRESSION: Stable chest with no radiographic evidence of acute cardiopulmonary process. Electronically Signed   By: Valetta Mole M.D.   On: 09/17/2021 08:13   DG CHEST PORT 1 VIEW  Result Date: 09/16/2021 CLINICAL DATA:  Acute abdominal pain. EXAM: PORTABLE CHEST 1 VIEW COMPARISON:  July 22, 2021. FINDINGS: Stable cardiomediastinal silhouette. Both lungs are clear. The visualized skeletal structures are unremarkable. IMPRESSION: No active disease. Electronically Signed   By: Marijo Conception M.D.   On: 09/16/2021 16:56        Scheduled Meds:  dorzolamide  1 drop Left Eye BID   enoxaparin (LOVENOX)  injection  40 mg Subcutaneous Q24H   fentaNYL       ipratropium-albuterol  3 mL Nebulization Q6H   latanoprost  1 drop Left Eye QHS   lidocaine       methylPREDNISolone (SOLU-MEDROL) injection  40 mg Intravenous Daily   midazolam       Continuous Infusions:  sodium chloride 75 mL/hr at 09/16/21 2131   piperacillin-tazobactam (ZOSYN)  IV 3.375 g (09/17/21 0506)     LOS: 0 days     Cordelia Poche, MD [facility] 09/17/2021, 1:15 PM  If 7PM-7AM, please contact night-coverage [email or web address]

## 2021-09-17 NOTE — Hospital Course (Addendum)
Virginia Walters is a 81 y.o. female with a history of COPD, chronic heart failure, memory loss vs dementia. Patient presented from her PCP's office secondary to abdominal pain with evidence of acute cholecystitis on CT imaging. Zosyn IV initiated. General surgery consulted who recommended IR consult for percutaneous cholecystostomy which was placed on 2/3. Patient with associated tachypnea and possible COPD exacerbation which has improved. Stable for discharge with transition to Augmentin to complete a 10 day course of antibiotics.

## 2021-09-18 DIAGNOSIS — G4700 Insomnia, unspecified: Secondary | ICD-10-CM

## 2021-09-18 LAB — CBC WITH DIFFERENTIAL/PLATELET
Abs Immature Granulocytes: 0.08 10*3/uL — ABNORMAL HIGH (ref 0.00–0.07)
Basophils Absolute: 0 10*3/uL (ref 0.0–0.1)
Basophils Relative: 0 %
Eosinophils Absolute: 0.1 10*3/uL (ref 0.0–0.5)
Eosinophils Relative: 1 %
HCT: 35.1 % — ABNORMAL LOW (ref 36.0–46.0)
Hemoglobin: 11.1 g/dL — ABNORMAL LOW (ref 12.0–15.0)
Immature Granulocytes: 1 %
Lymphocytes Relative: 7 %
Lymphs Abs: 0.8 10*3/uL (ref 0.7–4.0)
MCH: 30.1 pg (ref 26.0–34.0)
MCHC: 31.6 g/dL (ref 30.0–36.0)
MCV: 95.1 fL (ref 80.0–100.0)
Monocytes Absolute: 1.2 10*3/uL — ABNORMAL HIGH (ref 0.1–1.0)
Monocytes Relative: 10 %
Neutro Abs: 9.1 10*3/uL — ABNORMAL HIGH (ref 1.7–7.7)
Neutrophils Relative %: 81 %
Platelets: 167 10*3/uL (ref 150–400)
RBC: 3.69 MIL/uL — ABNORMAL LOW (ref 3.87–5.11)
RDW: 12.8 % (ref 11.5–15.5)
WBC: 11.4 10*3/uL — ABNORMAL HIGH (ref 4.0–10.5)
nRBC: 0 % (ref 0.0–0.2)

## 2021-09-18 MED ORDER — PHENOL 1.4 % MT LIQD: 2.0000 | OROMUCOSAL | Status: DC | PRN

## 2021-09-18 MED ORDER — LACTATED RINGERS IV BOLUS: 1000.0000 mL | Freq: Three times a day (TID) | INTRAVENOUS | Status: DC | PRN

## 2021-09-18 MED ORDER — DIPHENHYDRAMINE HCL 50 MG/ML IJ SOLN
12.5000 mg | Freq: Four times a day (QID) | INTRAMUSCULAR | Status: DC | PRN
  Administered 2021-09-19: 25 mg via INTRAVENOUS
  Filled 2021-09-18: qty 1

## 2021-09-18 MED ORDER — TRAZODONE HCL 50 MG PO TABS
50.0000 mg | ORAL_TABLET | Freq: Every day | ORAL | Status: DC
  Administered 2021-09-18 – 2021-09-20 (×3): 50 mg via ORAL
  Filled 2021-09-18 (×3): qty 1

## 2021-09-18 MED ORDER — LIP MEDEX EX OINT
1.0000 "application " | TOPICAL_OINTMENT | Freq: Two times a day (BID) | CUTANEOUS | Status: DC
  Administered 2021-09-18 – 2021-09-21 (×6): 1 via TOPICAL
  Filled 2021-09-18 (×2): qty 7

## 2021-09-18 MED ORDER — SODIUM CHLORIDE 0.9% FLUSH
5.0000 mL | Freq: Three times a day (TID) | INTRAVENOUS | Status: DC
  Administered 2021-09-18 – 2021-09-21 (×8): 5 mL

## 2021-09-18 MED ORDER — ACETAMINOPHEN 500 MG PO TABS
1000.0000 mg | ORAL_TABLET | Freq: Three times a day (TID) | ORAL | Status: DC
  Administered 2021-09-18 – 2021-09-21 (×10): 1000 mg via ORAL
  Filled 2021-09-18 (×10): qty 2

## 2021-09-18 MED ORDER — SODIUM CHLORIDE 0.9 % IV SOLN
250.0000 mL | INTRAVENOUS | Status: DC | PRN
  Administered 2021-09-20: 250 mL via INTRAVENOUS

## 2021-09-18 MED ORDER — SODIUM CHLORIDE 0.9% FLUSH: 3.0000 mL | INTRAVENOUS | Status: DC | PRN

## 2021-09-18 MED ORDER — MAGIC MOUTHWASH
15.0000 mL | Freq: Four times a day (QID) | ORAL | Status: DC | PRN
  Filled 2021-09-18: qty 15

## 2021-09-18 MED ORDER — ALUM & MAG HYDROXIDE-SIMETH 200-200-20 MG/5ML PO SUSP: 30.0000 mL | Freq: Four times a day (QID) | ORAL | Status: DC | PRN

## 2021-09-18 MED ORDER — MENTHOL 3 MG MT LOZG: 1.0000 | LOZENGE | OROMUCOSAL | Status: DC | PRN

## 2021-09-18 MED ORDER — MELATONIN 5 MG PO TABS
5.0000 mg | ORAL_TABLET | Freq: Every day | ORAL | Status: DC
  Administered 2021-09-18 – 2021-09-20 (×3): 5 mg via ORAL
  Filled 2021-09-18 (×3): qty 1

## 2021-09-18 MED ORDER — CALCIUM POLYCARBOPHIL 625 MG PO TABS
625.0000 mg | ORAL_TABLET | Freq: Two times a day (BID) | ORAL | Status: DC
  Administered 2021-09-18 – 2021-09-21 (×7): 625 mg via ORAL
  Filled 2021-09-18 (×7): qty 1

## 2021-09-18 MED ORDER — SIMETHICONE 40 MG/0.6ML PO SUSP
80.0000 mg | Freq: Four times a day (QID) | ORAL | Status: DC | PRN
  Filled 2021-09-18: qty 1.2

## 2021-09-18 MED ORDER — SODIUM CHLORIDE 0.9% FLUSH
3.0000 mL | Freq: Two times a day (BID) | INTRAVENOUS | Status: DC
  Administered 2021-09-18 – 2021-09-20 (×2): 3 mL via INTRAVENOUS

## 2021-09-18 MED ORDER — PROCHLORPERAZINE EDISYLATE 10 MG/2ML IJ SOLN: 5.0000 mg | INTRAMUSCULAR | Status: DC | PRN

## 2021-09-18 MED ORDER — VENLAFAXINE HCL ER 75 MG PO CP24
75.0000 mg | ORAL_CAPSULE | Freq: Every day | ORAL | Status: DC
  Administered 2021-09-19 – 2021-09-21 (×3): 75 mg via ORAL
  Filled 2021-09-18 (×3): qty 1

## 2021-09-18 NOTE — Progress Notes (Signed)
PROGRESS NOTE    [name]  NWG:[id number] DOB: 02-Apr-1941 DOA: 09/16/2021 PCP: Susy Frizzle, MD   Brief Narrative: Virginia Walters is a 81 y.o. female with a history of COPD, chronic heart failure, memory loss vs dementia. Patient presented from her PCP's office secondary to abdominal pain with evidence of acute cholecystitis on CT imaging. General surgery consulted who recommended IR consult for percutaneous cholecystostomy which was placed on 2/3. Patient with associated tachypnea and possible COPD exacerbation which has improved.   Assessment and Plan: * Acute cholecystitis Diagnosed on CT abdomen/pelvis. No evidence of choledocholithiasis on imaging. ALP, AST, ALT wnl. Associated leukocytosis. General surgery consulted and recommended IR consult for percutaneous cholecystostomy, which was completed on 2/3. Leukocytosis improving. Blood cultures with no growth to date. IR recommendations (2/4) reviewed. -General surgery recommendations: Zosyn, CLD -Morphine prn  Tachypnea Possibly related to COPD exacerbation as patient developed worsening wheezing. Per patient, tachypnea was worse than baseline. No associated tachycardia or fluid overload. Repeat chest x-ray looked clear. Tachypnea has improved. -See problem, COPD exacerbation   COPD exacerbation ([place]) Patient with worsening wheezing and tachypnea. No cough or sputum production. Wheezing seems more upper airway than lower -Scheduled Duonebs -Solu-medrol and will likely treat for a total of 3 days since there may be less lower respiratory disease -Mobilization -Flutter valve  COPD with chronic bronchitis ([place])- (present on admission) Patient has previously required oxygen which has been discontinued. Patient was prescribed Symbicort as an outpatient but is currently not using. -See problem, COPD exacerbation  Chronic systolic heart failure ([place])- (present on admission) Euvolemic. Patient's last LVEF mildly  reduced at 45-50% in 2019. On lisinopril. Not on diuretic therapy. Noted that patient is on verapamil for blood pressure control. Weight on admission of 238.1 lbs (108 kg). BNP slightly elevated. Weight down to 234 lbs and stable. -Daily weights, strict in out  Chronic kidney disease, stage 3a ([facility]) Creatinine stable and at baseline.  Dementia with behavioral problem- (present on admission) Noted on history. Patient lives alone with minimal support. -PT/OT/TOC -Delirium precautions -Resume home Effexor  Dyslipidemia- (present on admission) Patient is on Pravastatin as an outpatient. Held secondary to NPO status currently. -Resume Pravastatin once oral intake is more consistent  HTN (hypertension)- (present on admission) Patient is on verapamil, lisinopril, hydrochlorothiazide and doxazosin which are held secondary to NPO status. Slightly hypertensive currently. -Will allow for current blood pressure -Continue Hydralazine PRN  Insomnia -Resume home trazodone and melatonin    DVT prophylaxis: Lovenox Code Status:   Code Status: Full Code Family Communication: None at bedside Disposition Plan: Discharge home likely in 3-5 days pending improvement of tachypnea, general surgery/IR recommendations   Consultants:  General surgery Interventional radiology  Procedures:  Percutaneous cholecystostomy (2/3)  Antimicrobials: Zosyn    Subjective: No chest pain or dyspnea.  Objective: Vitals:   09/18/21 0151 09/18/21 0500 09/18/21 0522 09/18/21 0745  BP:   (!) 144/66   Pulse:   80   Resp:   17   Temp:   98.6 F (37 C)   TempSrc:   Oral   SpO2: 96%  94% 94%  Weight:  106.3 kg    Height:        Intake/Output Summary (Last 24 hours) at 09/18/2021 1153 Last data filed at 09/18/2021 1000 Gross per 24 hour  Intake 1495.33 ml  Output 775 ml  Net 720.33 ml    Filed Weights   09/16/21 1543 09/17/21 0537 09/18/21 0500  Weight: 108  kg 106.4 kg 106.3 kg     Examination:  General exam: Appears calm and comfortable Respiratory system: Transmitted upper airway wheezing. Mild tachypnea. Cardiovascular system: S1 & S2 heard, RRR. No murmurs, rubs, gallops or clicks. Gastrointestinal system: Abdomen is obese, soft and significantly tender in RUQ. Normal bowel sounds heard. Central nervous system: Alert and oriented. No focal neurological deficits. Musculoskeletal: No edema. No calf tenderness Skin: No cyanosis. No rashes Psychiatry: Judgement and insight appear normal. Mood & affect appropriate.     Data Reviewed: I have personally reviewed following labs and imaging studies  CBC Lab Results  Component Value Date   WBC 11.4 (H) 09/18/2021   RBC 3.69 (L) 09/18/2021   HGB 11.1 (L) 09/18/2021   HCT 35.1 (L) 09/18/2021   MCV 95.1 09/18/2021   MCH 30.1 09/18/2021   PLT 167 09/18/2021   MCHC 31.6 09/18/2021   RDW 12.8 09/18/2021   LYMPHSABS 0.8 09/18/2021   MONOABS 1.2 (H) 09/18/2021   EOSABS 0.1 09/18/2021   BASOSABS 0.0 52/84/1324     Last metabolic panel Lab Results  Component Value Date   NA 136 09/17/2021   K 3.5 09/17/2021   CL 103 09/17/2021   CO2 23 09/17/2021   BUN 18 09/17/2021   CREATININE 0.99 09/17/2021   GLUCOSE 158 (H) 09/17/2021   GFRNONAA 58 (L) 09/17/2021   GFRAA 45 (L) 11/13/2020   CALCIUM 8.7 (L) 09/17/2021   PHOS 3.6 08/01/2018   PROT 6.6 09/17/2021   ALBUMIN 3.3 (L) 09/17/2021   LABGLOB 3.2 10/12/2016   BILITOT 1.1 09/17/2021   ALKPHOS 60 09/17/2021   AST 19 09/17/2021   ALT 19 09/17/2021   ANIONGAP 10 09/17/2021    CBG (last 3)  No results for input(s): GLUCAP in the last 72 hours.   GFR: Estimated Creatinine Clearance: 54.9 mL/min (by C-G formula based on SCr of 0.99 mg/dL).  Coagulation Profile: Recent Labs  Lab 09/17/21 0940  INR 1.4*     Recent Results (from the past 240 hour(s))  Resp Panel by RT-PCR (Flu A&B, Covid) Nasopharyngeal Swab     Status: None   Collection Time:  09/16/21  3:29 PM   Specimen: Nasopharyngeal Swab; Nasopharyngeal(NP) swabs in vial transport medium  Result Value Ref Range Status   SARS Coronavirus 2 by RT PCR NEGATIVE NEGATIVE Final    Comment: (NOTE) SARS-CoV-2 target nucleic acids are NOT DETECTED.  The SARS-CoV-2 RNA is generally detectable in upper respiratory specimens during the acute phase of infection. The lowest concentration of SARS-CoV-2 viral copies this assay can detect is 138 copies/mL. A negative result does not preclude SARS-Cov-2 infection and should not be used as the sole basis for treatment or other patient management decisions. A negative result may occur with  improper specimen collection/handling, submission of specimen other than nasopharyngeal swab, presence of viral mutation(s) within the areas targeted by this assay, and inadequate number of viral copies(<138 copies/mL). A negative result must be combined with clinical observations, patient history, and epidemiological information. The expected result is Negative.  Fact Sheet for Patients:  EntrepreneurPulse.com.au  Fact Sheet for Healthcare Providers:  IncredibleEmployment.be  This test is no t yet approved or cleared by the [place] FDA and  has been authorized for detection and/or diagnosis of SARS-CoV-2 by FDA under an Emergency Use Authorization (EUA). This EUA will remain  in effect (meaning this test can be used) for the duration of the COVID-19 declaration under Section 564(b)(1) of the Act, 21 U.S.C.section 360bbb-3(b)(1), unless  the authorization is terminated  or revoked sooner.       Influenza A by PCR NEGATIVE NEGATIVE Final   Influenza B by PCR NEGATIVE NEGATIVE Final    Comment: (NOTE) The Xpert Xpress SARS-CoV-2/FLU/RSV plus assay is intended as an aid in the diagnosis of influenza from Nasopharyngeal swab specimens and should not be used as a sole basis for treatment. Nasal washings  and aspirates are unacceptable for Xpert Xpress SARS-CoV-2/FLU/RSV testing.  Fact Sheet for Patients: EntrepreneurPulse.com.au  Fact Sheet for Healthcare Providers: IncredibleEmployment.be  This test is not yet approved or cleared by the [place] FDA and has been authorized for detection and/or diagnosis of SARS-CoV-2 by FDA under an Emergency Use Authorization (EUA). This EUA will remain in effect (meaning this test can be used) for the duration of the COVID-19 declaration under Section 564(b)(1) of the Act, 21 U.S.C. section 360bbb-3(b)(1), unless the authorization is terminated or revoked.  Performed at [facility], [place] [address] [place], [place] [address]   Culture, blood (routine x 2)     Status: None (Preliminary result)   Collection Time: 09/16/21  6:20 PM   Specimen: BLOOD RIGHT FOREARM  Result Value Ref Range Status   Specimen Description   Final    BLOOD RIGHT FOREARM BLOOD Performed at [facility] [address] [place], [place] [address]    Special Requests   Final    BOTTLES DRAWN AEROBIC AND ANAEROBIC Blood Culture adequate volume Performed at [facility] [address], [place], [place] [address]    Culture   Final    NO GROWTH 2 DAYS Performed at [facility] [address], [place], [place] [address]    Report Status PENDING  Incomplete  Culture, blood (routine x 2)     Status: None (Preliminary result)   Collection Time: 09/16/21  6:24 PM   Specimen: BLOOD  Result Value Ref Range Status   Specimen Description   Final    BLOOD RIGHT ANTECUBITAL Performed at [facility] [address], [place], [place] [address]    Special Requests   Final    BOTTLES DRAWN AEROBIC AND ANAEROBIC Blood Culture adequate volume Performed at [facility] [address] [place], [place] [address]    Culture   Final    NO  GROWTH 2 DAYS Performed at [facility] [address], [place],  [address]    Report Status PENDING  Incomplete         Radiology Studies: CT ABDOMEN PELVIS W CONTRAST  Result Date: 09/16/2021 CLINICAL DATA:  Acute abdominal pain. Right-sided pain and vomiting for 2 days EXAM: CT ABDOMEN AND PELVIS WITH CONTRAST TECHNIQUE: Multidetector CT imaging of the abdomen and pelvis was performed using the standard protocol following bolus administration of intravenous contrast. RADIATION DOSE REDUCTION: This exam was performed according to the departmental dose-optimization program which includes automated exposure control, adjustment of the mA and/or kV according to patient size and/or use of iterative reconstruction technique. CONTRAST:  180mL OMNIPAQUE IOHEXOL 300 MG/ML  SOLN COMPARISON:  None. FINDINGS: Lower chest: No acute abnormality. Hepatobiliary: Diffuse low attenuation of the liver as can be seen with hepatic steatosis. No hepatic mass. No intrahepatic or extrahepatic biliary ductal dilatation. Distended gallbladder with cholelithiasis and pericholecystic inflammatory changes as can be seen with acute cholecystitis. Pancreas: Unremarkable. No pancreatic ductal dilatation or surrounding inflammatory changes. Spleen: Normal in size without focal abnormality. Adrenals/Urinary Tract:  Normal adrenal glands. 4.5 cm right interpolar fluid attenuating renal mass consistent with a cyst. No urolithiasis or obstructive uropathy. Normal bladder. Stomach/Bowel: Moderate size hiatal hernia. Stomach is otherwise unremarkable. Appendix appears normal. No evidence of bowel wall thickening, distention, or inflammatory changes. Incidental note made of a duodenal diverticulum. Diverticulosis without evidence of diverticulitis. Vascular/Lymphatic: Aortic atherosclerosis. No enlarged abdominal or pelvic lymph nodes. Reproductive: Uterus and bilateral adnexa are unremarkable. Other: No abdominal wall hernia or  abnormality. No abdominopelvic ascites. Musculoskeletal: No acute osseous abnormality. Chronic T12 and L4 vertebral body compression fractures. Anterior bridging osteophytes throughout the thoracolumbar spine as can be seen with diffuse idiopathic skeletal hyperostosis. Degenerative disease with disc height loss at L3-4, L4-5 and L5-S1. Severe bilateral facet arthropathy at L3-4, L4-5 and L5-S1. Severe bilateral foraminal stenosis at L3-4. Severe right foraminal stenosis at L4-5. Severe right foraminal stenosis at L5-S1. Moderate left foraminal stenosis at L5-S1. IMPRESSION: 1. Distended gallbladder with cholelithiasis and pericholecystic inflammatory changes as can be seen with acute cholecystitis. 2. Hepatic steatosis. 3. Moderate size hiatal hernia. 4. Diverticulosis without evidence of diverticulitis. 5. Diffuse lumbar spine spondylosis as described above. 6. Aortic Atherosclerosis (ICD10-I70.0). Electronically Signed   By: Kathreen Devoid M.D.   On: 09/16/2021 15:17   IR Perc Cholecystostomy  Result Date: 09/17/2021 INDICATION: Acute cholecystitis. Poor operative candidate. Please perform image guided cholecystostomy tube placement for infection source control purposes. EXAM: ULTRASOUND AND FLUOROSCOPIC-GUIDED CHOLECYSTOSTOMY TUBE PLACEMENT COMPARISON:  None. MEDICATIONS: The patient is currently admitted to the hospital and on intravenous antibiotics. Antibiotics were administered within an appropriate time frame prior to skin puncture. ANESTHESIA/SEDATION: Moderate (conscious) sedation was employed during this procedure as administered by the Interventional Radiology RN. A total of Versed 0.25 mg and Fentanyl 25 mcg was administered intravenously. Moderate Sedation Time: 15 minutes. The patient's level of consciousness and vital signs were monitored continuously by radiology nursing throughout the procedure under my direct supervision. CONTRAST:  10 cc Omnipaque 300 - administered into the gallbladder lumen.  FLUOROSCOPY TIME:  1 minute, 6 seconds (46 mGy) COMPLICATIONS: None immediate. PROCEDURE: Informed written consent was obtained from the patient after a discussion of the risks, benefits and alternatives to treatment. Questions regarding the procedure were encouraged and answered. A timeout was performed prior to the initiation of the procedure. The right upper abdominal quadrant was prepped and draped in the usual sterile fashion, and a sterile drape was applied covering the operative field. Maximum barrier sterile technique with sterile gowns and gloves were used for the procedure. A timeout was performed prior to the initiation of the procedure. Local anesthesia was provided with 1% lidocaine with epinephrine. Ultrasound scanning of the right upper quadrant demonstrates a markedly dilated gallbladder. Of note, the patient reported pain with ultrasound imaging over the gallbladder. Utilizing a transhepatic approach, a 22 gauge needle was advanced into the gallbladder under direct ultrasound guidance. An ultrasound image was saved for documentation purposes. Appropriate intraluminal puncture was confirmed with the efflux of bile and advancement of an 0.018 wire into the gallbladder lumen. The needle was exchanged for an [place] set. A small amount of contrast was injected to confirm appropriate intraluminal positioning. Over a Benson wire, a 62.2-French Cook cholecystomy tube was advanced into the gallbladder fossa, coiled and locked. Bile was aspirated and a small amount of contrast was injected as several post procedural spot radiographic images were obtained in various obliquities. The catheter was secured to the skin with suture, connected to a drainage bag and a dressing  was applied. The patient tolerated the procedure well without immediate post procedural complication. IMPRESSION: Successful ultrasound and fluoroscopic guided placement of a 10.2 French cholecystostomy tube. Electronically Signed   By: Sandi Mariscal M.D.   On: 09/17/2021 13:50   DG CHEST PORT 1 VIEW  Result Date: 09/17/2021 CLINICAL DATA:  Tachypnea EXAM: PORTABLE CHEST 1 VIEW COMPARISON:  Chest radiograph 09/16/2021 FINDINGS: The heart is mildly enlarged, unchanged. The upper mediastinal contours are normal. There is no focal consolidation or pulmonary edema. There is no pleural effusion or pneumothorax. There is unchanged asymmetric elevation of the right hemidiaphragm. The bones are stable. IMPRESSION: Stable chest with no radiographic evidence of acute cardiopulmonary process. Electronically Signed   By: Valetta Mole M.D.   On: 09/17/2021 08:13   DG CHEST PORT 1 VIEW  Result Date: 09/16/2021 CLINICAL DATA:  Acute abdominal pain. EXAM: PORTABLE CHEST 1 VIEW COMPARISON:  July 22, 2021. FINDINGS: Stable cardiomediastinal silhouette. Both lungs are clear. The visualized skeletal structures are unremarkable. IMPRESSION: No active disease. Electronically Signed   By: Marijo Conception M.D.   On: 09/16/2021 16:56        Scheduled Meds:  acetaminophen  1,000 mg Oral TID   dorzolamide  1 drop Left Eye BID   enoxaparin (LOVENOX) injection  40 mg Subcutaneous Q24H   guaiFENesin  1,200 mg Oral BID   ipratropium-albuterol  3 mL Nebulization Q6H   latanoprost  1 drop Left Eye QHS   lip balm  1 application Topical BID   melatonin  5 mg Oral QHS   methylPREDNISolone (SOLU-MEDROL) injection  40 mg Intravenous Daily   polycarbophil  625 mg Oral BID   sodium chloride flush  3 mL Intravenous Q12H   sodium chloride flush  5 mL Intracatheter Q8H   traZODone  50 mg Oral QHS   [START ON 09/19/2021] venlafaxine XR  75 mg Oral Q breakfast   Continuous Infusions:  sodium chloride     piperacillin-tazobactam (ZOSYN)  IV 3.375 g (09/18/21 0505)     LOS: 1 day     Cordelia Poche, MD [facility] 09/18/2021, 11:53 AM  If 7PM-7AM, please contact night-coverage [email or web address]

## 2021-09-18 NOTE — Progress Notes (Signed)
Virginia Walters [id number] March 11, 1941  CARE TEAM:  PCP: Susy Frizzle, MD  Outpatient Care Team: Patient Care Team: Susy Frizzle, MD as PCP - General (Family Medicine) Alisa Graff, FNP as Nurse Practitioner (Family Medicine) Theora Gianotti, NP as Nurse Practitioner (Cardiology) Edythe Clarity, [facility] as Pharmacist (Pharmacist)  Inpatient Treatment Team: Treatment Team: Attending Provider: Mariel Aloe, MD; Rounding Team: Nolon Nations, MD; Registered Nurse: Heloise Ochoa, RN; Registered Nurse: Robb Matar, RN; Rounding Team: Ian Bushman, MD; Rounding Team: Dorthy [facility], MD; Consulting Physician: Dorthy [facility], MD; Physical Therapist: Neil Crouch, PT; Utilization Review: Claudie Leach, RN; Registered Nurse: Tanda Rockers, RN; Case Manager: Purcell Mouton, RN   Problem List:   Principal Problem:   Acute cholecystitis Active Problems:   HTN (hypertension)   Dyslipidemia   GERD (gastroesophageal reflux disease)   Dementia with behavioral problem   Chronic systolic heart failure (HCC)   COPD exacerbation ([place])   COPD with chronic bronchitis ([place])   Chronic kidney disease, stage 3a ([place])   Tachypnea      * No surgery found *      Assessment  Guarded.  [facility] Stay = 1 days)  Assessment/Plan Acute Cholecystitis  - CT 2/2 with above  -We will do IV antibiotics for 5 days minimum piperacillin/tazobactam per protocol given severe cholecystitis and fragile patient. - WBC stable at 20, Tmax 100.3, very ttp in RUQ on exam.  Repeat white count pending - LFTs all still normal - Solid diet as tolerated - IR consulted - percutaneous cholecystostomy, done 2/3 -Delay consideration of any interval cholecystectomy in 6-8 weeks which will also allow for medical optimization prior to surgical intervention.  Would need medical and pulmonary clearance preop. -Surgery to help follow.   FEN -   IVF per TRH but I would lean towards bed locking her with as needed backup.  Keep her on the dry side with her cardiopulmonary issues.  I would start with clear liquids and advance gradually.   VTE - SCDs, okay for chemical prophylaxis from a general surgery standpoint ID - Zosyn   COPD: - patient is very short of breath and tachypneic on exam. Still has some rales at the lung bases b/l. CXR without active disease, on 3L via [place] this AM Hx CHF (last EF 2019 w/ EF 45-50% w/ diffuse hypokinesis, NM study in 2020 with EF ~61%)  PFO HTN  HLD   I reviewed hospitalist notes, last 24 h vitals and pain scores, last 48 h intake and output, last 24 h labs and trends, and last 24 h imaging results.   This care required moderate level of medical decision making.           25 minutes spent in review, evaluation, examination, counseling, and coordination of care.   I have reviewed this patient's available data, including medical history, events of note, physical examination and test results as part of my evaluation.  A significant portion of that time was spent in counseling.  Care during the described time interval was provided by me.  09/18/2021    Subjective: (Chief complaint)  Exhausted.  Awakens.  Still sore but pain medications helping.  Only wanting to liquids.  Not much of an appetite.  Objective:  Vital signs:  Vitals:   09/18/21 0151 09/18/21 0500 09/18/21 0522 09/18/21 0745  BP:   (!) 144/66   Pulse:   80   Resp:   17  Temp:   98.6 F (37 C)   TempSrc:   Oral   SpO2: 96%  94% 94%  Weight:  106.3 kg    Height:        Last BM Date: 09/16/21  Intake/Output   Yesterday:  02/03 0701 - 02/04 0700 In: 1799.6 [P.O.:120; I.V.:1500.5; IV Piggyback:179.1] Out: 675 [Urine:400; Drains:275] This shift:  No intake/output data recorded.  Bowel function:  Flatus: No  BM:  No  Drain: Bilious   Physical Exam:  General: Pt sleeping.  Awakens up quite exhausted and  tired.  Somewhat sickly.  mild acute distress Eyes: PERRL, normal EOM.  Sclera clear.  No icterus Neuro: CN II-XII intact w/o focal sensory/motor deficits. Lymph: No head/neck/groin lymphadenopathy Psych:  No delerium/psychosis/paranoia.  Oriented x 4 HENT: Normocephalic, Mucus membranes moist.  No thrush Neck: Supple, No tracheal deviation.  No obvious thyromegaly Chest: No pain to chest wall compression.  Good respiratory excursion.  No audible wheezing CV:  Pulses intact.  Regular rhythm.  No major extremity edema MS: Normal AROM mjr joints.  No obvious deformity  Abdomen: Soft.  Mildy distended.  Tenderness at RUQ drain site .  Rest of the abdomen nontender.  No evidence of peritonitis.  No incarcerated hernias.  Ext:   No deformity.  No mjr edema.  No cyanosis Skin: No petechiae / purpurea.  No major sores.  Warm and dry    Results:   Cultures: Recent Results (from the past 720 hour(s))  Resp Panel by RT-PCR (Flu A&B, Covid) Nasopharyngeal Swab     Status: None   Collection Time: 09/16/21  3:29 PM   Specimen: Nasopharyngeal Swab; Nasopharyngeal(NP) swabs in vial transport medium  Result Value Ref Range Status   SARS Coronavirus 2 by RT PCR NEGATIVE NEGATIVE Final    Comment: (NOTE) SARS-CoV-2 target nucleic acids are NOT DETECTED.  The SARS-CoV-2 RNA is generally detectable in upper respiratory specimens during the acute phase of infection. The lowest concentration of SARS-CoV-2 viral copies this assay can detect is 138 copies/mL. A negative result does not preclude SARS-Cov-2 infection and should not be used as the sole basis for treatment or other patient management decisions. A negative result may occur with  improper specimen collection/handling, submission of specimen other than nasopharyngeal swab, presence of viral mutation(s) within the areas targeted by this assay, and inadequate number of viral copies(<138 copies/mL). A negative result must be combined  with clinical observations, patient history, and epidemiological information. The expected result is Negative.  Fact Sheet for Patients:  EntrepreneurPulse.com.au  Fact Sheet for Healthcare Providers:  IncredibleEmployment.be  This test is no t yet approved or cleared by the [place] FDA and  has been authorized for detection and/or diagnosis of SARS-CoV-2 by FDA under an Emergency Use Authorization (EUA). This EUA will remain  in effect (meaning this test can be used) for the duration of the COVID-19 declaration under Section 564(b)(1) of the Act, 21 U.S.C.section 360bbb-3(b)(1), unless the authorization is terminated  or revoked sooner.       Influenza A by PCR NEGATIVE NEGATIVE Final   Influenza B by PCR NEGATIVE NEGATIVE Final    Comment: (NOTE) The Xpert Xpress SARS-CoV-2/FLU/RSV plus assay is intended as an aid in the diagnosis of influenza from Nasopharyngeal swab specimens and should not be used as a sole basis for treatment. Nasal washings and aspirates are unacceptable for Xpert Xpress SARS-CoV-2/FLU/RSV testing.  Fact Sheet for Patients: EntrepreneurPulse.com.au  Fact Sheet for Healthcare Providers: IncredibleEmployment.be  This  test is not yet approved or cleared by the [place] and has been authorized for detection and/or diagnosis of SARS-CoV-2 by FDA under an Emergency Use Authorization (EUA). This EUA will remain in effect (meaning this test can be used) for the duration of the COVID-19 declaration under Section 564(b)(1) of the Act, 21 U.S.C. section 360bbb-3(b)(1), unless the authorization is terminated or revoked.  Performed at [facility], [place] [address], [place], [place] [address]   Culture, blood (routine x 2)     Status: None (Preliminary result)   Collection Time: 09/16/21  6:20 PM   Specimen: BLOOD RIGHT FOREARM  Result Value Ref Range  Status   Specimen Description   Final    BLOOD RIGHT FOREARM BLOOD Performed at [place] [address], [place], [place] [address]    Special Requests   Final    BOTTLES DRAWN AEROBIC AND ANAEROBIC Blood Culture adequate volume Performed at [facility] [address] [place], [place] [address]    Culture   Final    NO GROWTH < 12 HOURS Performed at [place] [address], [place], [place] [address]    Report Status PENDING  Incomplete  Culture, blood (routine x 2)     Status: None (Preliminary result)   Collection Time: 09/16/21  6:24 PM   Specimen: BLOOD  Result Value Ref Range Status   Specimen Description   Final    BLOOD RIGHT ANTECUBITAL Performed at [place] [address], [place], [place] [address]    Special Requests   Final    BOTTLES DRAWN AEROBIC AND ANAEROBIC Blood Culture adequate volume Performed at [place] [address], [place], [place] [address]    Culture   Final    NO GROWTH < 12 HOURS Performed at [place] [address] [place], [place] [address]    Report Status PENDING  Incomplete    Labs: Results for orders placed or performed during the hospital encounter of 09/16/21 (from the past 48 hour(s))  CBC with Differential     Status: Abnormal   Collection Time: 09/16/21  1:54 PM  Result Value Ref Range   WBC 20.1 (H) 4.0 - 10.5 K/uL   RBC 4.49 3.87 - 5.11 MIL/uL   Hemoglobin 13.6 12.0 - 15.0 g/dL   HCT 41.2 36.0 - 46.0 %   MCV 91.8 80.0 - 100.0 fL   MCH 30.3 26.0 - 34.0 pg   MCHC 33.0 30.0 - 36.0 g/dL   RDW 12.6 11.5 - 15.5 %   Platelets 208 150 - 400 K/uL   nRBC 0.0 0.0 - 0.2 %   Neutrophils Relative % 82 %   Neutro Abs 16.7 (H) 1.7 - 7.7 K/uL   Lymphocytes Relative 7 %   Lymphs Abs 1.3 0.7 - 4.0 K/uL   Monocytes Relative 10 %   Monocytes Absolute 1.9 (H) 0.1 - 1.0 K/uL   Eosinophils Relative 0 %   Eosinophils Absolute 0.0  0.0 - 0.5 K/uL   Basophils Relative 0 %   Basophils Absolute 0.0 0.0 - 0.1 K/uL   Immature Granulocytes 1 %   Abs Immature Granulocytes 0.11 (H) 0.00 - 0.07 K/uL    Comment: Performed at [facility], [place] [address] [place], [place] [address]  Comprehensive metabolic panel     Status: Abnormal   Collection Time: 09/16/21  1:54 PM  Result Value Ref Range  Sodium 136 135 - 145 mmol/L   Potassium 3.6 3.5 - 5.1 mmol/L   Chloride 102 98 - 111 mmol/L   CO2 23 22 - 32 mmol/L   Glucose, Bld 131 (H) 70 - 99 mg/dL    Comment: Glucose reference range applies only to samples taken after fasting for at least 8 hours.   BUN 17 8 - 23 mg/dL   Creatinine, Ser 0.81 0.44 - 1.00 mg/dL   Calcium 9.6 8.9 - 10.3 mg/dL   Total Protein 7.5 6.5 - 8.1 g/dL   Albumin 3.9 3.5 - 5.0 g/dL   AST 16 15 - 41 U/L   ALT 19 0 - 44 U/L   Alkaline Phosphatase 54 38 - 126 U/L   Total Bilirubin 0.8 0.3 - 1.2 mg/dL   GFR, Estimated >60 >60 mL/min    Comment: (NOTE) Calculated using the CKD-EPI Creatinine Equation (2021)    Anion gap 11 5 - 15    Comment: Performed at [facility], [place] [address], [place], [place] [address]  Lipase, blood     Status: None   Collection Time: 09/16/21  1:54 PM  Result Value Ref Range   Lipase 28 11 - 51 U/L    Comment: Performed at [facility], [place] [address] [place], [place] [address]  Brain natriuretic peptide     Status: Abnormal   Collection Time: 09/16/21  1:54 PM  Result Value Ref Range   B Natriuretic Peptide 194.6 (H) 0.0 - 100.0 pg/mL    Comment: Performed at [facility], [place] [address], [place], [place] [address]  Resp Panel by RT-PCR (Flu A&B, Covid) Nasopharyngeal Swab     Status: None   Collection Time: 09/16/21  3:29 PM   Specimen: Nasopharyngeal Swab; Nasopharyngeal(NP) swabs in vial transport medium  Result Value Ref Range   SARS Coronavirus 2 by RT PCR NEGATIVE NEGATIVE    Comment:  (NOTE) SARS-CoV-2 target nucleic acids are NOT DETECTED.  The SARS-CoV-2 RNA is generally detectable in upper respiratory specimens during the acute phase of infection. The lowest concentration of SARS-CoV-2 viral copies this assay can detect is 138 copies/mL. A negative result does not preclude SARS-Cov-2 infection and should not be used as the sole basis for treatment or other patient management decisions. A negative result may occur with  improper specimen collection/handling, submission of specimen other than nasopharyngeal swab, presence of viral mutation(s) within the areas targeted by this assay, and inadequate number of viral copies(<138 copies/mL). A negative result must be combined with clinical observations, patient history, and epidemiological information. The expected result is Negative.  Fact Sheet for Patients:  EntrepreneurPulse.com.au  Fact Sheet for Healthcare Providers:  IncredibleEmployment.be  This test is no t yet approved or cleared by the [place] FDA and  has been authorized for detection and/or diagnosis of SARS-CoV-2 by FDA under an Emergency Use Authorization (EUA). This EUA will remain  in effect (meaning this test can be used) for the duration of the COVID-19 declaration under Section 564(b)(1) of the Act, 21 U.S.C.section 360bbb-3(b)(1), unless the authorization is terminated  or revoked sooner.       Influenza A by PCR NEGATIVE NEGATIVE   Influenza B by PCR NEGATIVE NEGATIVE    Comment: (NOTE) The Xpert Xpress SARS-CoV-2/FLU/RSV plus assay is intended as an aid in the diagnosis of influenza from Nasopharyngeal swab specimens and should not be used as a sole basis for treatment. Nasal washings and aspirates are unacceptable for  Xpert Xpress SARS-CoV-2/FLU/RSV testing.  Fact Sheet for Patients: EntrepreneurPulse.com.au  Fact Sheet for Healthcare  Providers: IncredibleEmployment.be  This test is not yet approved or cleared by the [place] FDA and has been authorized for detection and/or diagnosis of SARS-CoV-2 by FDA under an Emergency Use Authorization (EUA). This EUA will remain in effect (meaning this test can be used) for the duration of the COVID-19 declaration under Section 564(b)(1) of the Act, 21 U.S.C. section 360bbb-3(b)(1), unless the authorization is terminated or revoked.  Performed at [facility], [place] [address] [place], [place] [address]   Culture, blood (routine x 2)     Status: None (Preliminary result)   Collection Time: 09/16/21  6:20 PM   Specimen: BLOOD RIGHT FOREARM  Result Value Ref Range   Specimen Description      BLOOD RIGHT FOREARM BLOOD Performed at [place] [address], [place], [place] [address]    Special Requests      BOTTLES DRAWN AEROBIC AND ANAEROBIC Blood Culture adequate volume Performed at [facility] [address], [place], [place] [address]    Culture      NO GROWTH < 12 HOURS Performed at [facility] [address] [place], [place] [address]    Report Status PENDING   Culture, blood (routine x 2)     Status: None (Preliminary result)   Collection Time: 09/16/21  6:24 PM   Specimen: BLOOD  Result Value Ref Range   Specimen Description      BLOOD RIGHT ANTECUBITAL Performed at [facility], [place] [address] [place], [place] [address]    Special Requests      BOTTLES DRAWN AEROBIC AND ANAEROBIC Blood Culture adequate volume Performed at [facility] [address] [place], [place] [address]    Culture      NO GROWTH < 12 HOURS Performed at [facility] [address], [place], [place] [address]    Report Status PENDING   Comprehensive metabolic panel     Status: Abnormal   Collection Time: 09/17/21  7:23 AM  Result Value Ref  Range   Sodium 136 135 - 145 mmol/L   Potassium 3.5 3.5 - 5.1 mmol/L   Chloride 103 98 - 111 mmol/L   CO2 23 22 - 32 mmol/L   Glucose, Bld 158 (H) 70 - 99 mg/dL    Comment: Glucose reference range applies only to samples taken after fasting for at least 8 hours.   BUN 18 8 - 23 mg/dL   Creatinine, Ser 0.99 0.44 - 1.00 mg/dL   Calcium 8.7 (L) 8.9 - 10.3 mg/dL   Total Protein 6.6 6.5 - 8.1 g/dL   Albumin 3.3 (L) 3.5 - 5.0 g/dL   AST 19 15 - 41 U/L   ALT 19 0 - 44 U/L   Alkaline Phosphatase 60 38 - 126 U/L   Total Bilirubin 1.1 0.3 - 1.2 mg/dL   GFR, Estimated 58 (L) >60 mL/min    Comment: (NOTE) Calculated using the CKD-EPI Creatinine Equation (2021)    Anion gap 10 5 - 15    Comment: Performed at [facility], [place] [address] [place], [place] [address]  CBC     Status: Abnormal   Collection Time: 09/17/21  7:23 AM  Result Value Ref Range   WBC 20.7 (H) 4.0 - 10.5 K/uL   RBC 4.10 3.87 - 5.11 MIL/uL   Hemoglobin 12.5 12.0 -  15.0 g/dL   HCT 38.3 36.0 - 46.0 %   MCV 93.4 80.0 - 100.0 fL   MCH 30.5 26.0 - 34.0 pg   MCHC 32.6 30.0 - 36.0 g/dL   RDW 12.7 11.5 - 15.5 %   Platelets 181 150 - 400 K/uL   nRBC 0.0 0.0 - 0.2 %    Comment: Performed at [facility], [place] [address] [place], [place] [address]  Protime-INR     Status: Abnormal   Collection Time: 09/17/21  9:40 AM  Result Value Ref Range   Prothrombin Time 17.0 (H) 11.4 - 15.2 seconds   INR 1.4 (H) 0.8 - 1.2    Comment: (NOTE) INR goal varies based on device and disease states. Performed at [facility], [place] [address] [place], [place] [address]   Blood gas, arterial     Status: Abnormal   Collection Time: 09/17/21  5:22 PM  Result Value Ref Range   pH, Arterial 7.377 7.350 - 7.450   pCO2 arterial 42.7 32.0 - 48.0 mmHg   pO2, Arterial 78.1 (L) 83.0 - 108.0 mmHg   Bicarbonate 24.5 20.0 - 28.0 mmol/L   Acid-base deficit 0.2 0.0 - 2.0 mmol/L   O2 Saturation  95.1 %   Patient temperature 98.6    Allens test (pass/fail) PASS PASS    Comment: Performed at [facility], [place] [address], [place],  [address]    Imaging / Studies: CT ABDOMEN PELVIS W CONTRAST  Result Date: 09/16/2021 CLINICAL DATA:  Acute abdominal pain. Right-sided pain and vomiting for 2 days EXAM: CT ABDOMEN AND PELVIS WITH CONTRAST TECHNIQUE: Multidetector CT imaging of the abdomen and pelvis was performed using the standard protocol following bolus administration of intravenous contrast. RADIATION DOSE REDUCTION: This exam was performed according to the departmental dose-optimization program which includes automated exposure control, adjustment of the mA and/or kV according to patient size and/or use of iterative reconstruction technique. CONTRAST:  154mL OMNIPAQUE IOHEXOL 300 MG/ML  SOLN COMPARISON:  None. FINDINGS: Lower chest: No acute abnormality. Hepatobiliary: Diffuse low attenuation of the liver as can be seen with hepatic steatosis. No hepatic mass. No intrahepatic or extrahepatic biliary ductal dilatation. Distended gallbladder with cholelithiasis and pericholecystic inflammatory changes as can be seen with acute cholecystitis. Pancreas: Unremarkable. No pancreatic ductal dilatation or surrounding inflammatory changes. Spleen: Normal in size without focal abnormality. Adrenals/Urinary Tract: Normal adrenal glands. 4.5 cm right interpolar fluid attenuating renal mass consistent with a cyst. No urolithiasis or obstructive uropathy. Normal bladder. Stomach/Bowel: Moderate size hiatal hernia. Stomach is otherwise unremarkable. Appendix appears normal. No evidence of bowel wall thickening, distention, or inflammatory changes. Incidental note made of a duodenal diverticulum. Diverticulosis without evidence of diverticulitis. Vascular/Lymphatic: Aortic atherosclerosis. No enlarged abdominal or pelvic lymph nodes. Reproductive: Uterus and bilateral adnexa are  unremarkable. Other: No abdominal wall hernia or abnormality. No abdominopelvic ascites. Musculoskeletal: No acute osseous abnormality. Chronic T12 and L4 vertebral body compression fractures. Anterior bridging osteophytes throughout the thoracolumbar spine as can be seen with diffuse idiopathic skeletal hyperostosis. Degenerative disease with disc height loss at L3-4, L4-5 and L5-S1. Severe bilateral facet arthropathy at L3-4, L4-5 and L5-S1. Severe bilateral foraminal stenosis at L3-4. Severe right foraminal stenosis at L4-5. Severe right foraminal stenosis at L5-S1. Moderate left foraminal stenosis at L5-S1. IMPRESSION: 1. Distended gallbladder with cholelithiasis and pericholecystic inflammatory changes as can be seen with acute cholecystitis. 2. Hepatic steatosis. 3. Moderate size hiatal hernia. 4. Diverticulosis without evidence of diverticulitis.  5. Diffuse lumbar spine spondylosis as described above. 6. Aortic Atherosclerosis (ICD10-I70.0). Electronically Signed   By: Kathreen Devoid M.D.   On: 09/16/2021 15:17   IR Perc Cholecystostomy  Result Date: 09/17/2021 INDICATION: Acute cholecystitis. Poor operative candidate. Please perform image guided cholecystostomy tube placement for infection source control purposes. EXAM: ULTRASOUND AND FLUOROSCOPIC-GUIDED CHOLECYSTOSTOMY TUBE PLACEMENT COMPARISON:  None. MEDICATIONS: The patient is currently admitted to the hospital and on intravenous antibiotics. Antibiotics were administered within an appropriate time frame prior to skin puncture. ANESTHESIA/SEDATION: Moderate (conscious) sedation was employed during this procedure as administered by the Interventional Radiology RN. A total of Versed 0.25 mg and Fentanyl 25 mcg was administered intravenously. Moderate Sedation Time: 15 minutes. The patient's level of consciousness and vital signs were monitored continuously by radiology nursing throughout the procedure under my direct supervision. CONTRAST:  10 cc  Omnipaque 300 - administered into the gallbladder lumen. FLUOROSCOPY TIME:  1 minute, 6 seconds (46 mGy) COMPLICATIONS: None immediate. PROCEDURE: Informed written consent was obtained from the patient after a discussion of the risks, benefits and alternatives to treatment. Questions regarding the procedure were encouraged and answered. A timeout was performed prior to the initiation of the procedure. The right upper abdominal quadrant was prepped and draped in the usual sterile fashion, and a sterile drape was applied covering the operative field. Maximum barrier sterile technique with sterile gowns and gloves were used for the procedure. A timeout was performed prior to the initiation of the procedure. Local anesthesia was provided with 1% lidocaine with epinephrine. Ultrasound scanning of the right upper quadrant demonstrates a markedly dilated gallbladder. Of note, the patient reported pain with ultrasound imaging over the gallbladder. Utilizing a transhepatic approach, a 22 gauge needle was advanced into the gallbladder under direct ultrasound guidance. An ultrasound image was saved for documentation purposes. Appropriate intraluminal puncture was confirmed with the efflux of bile and advancement of an 0.018 wire into the gallbladder lumen. The needle was exchanged for an [place] set. A small amount of contrast was injected to confirm appropriate intraluminal positioning. Over a Benson wire, a 32.2-French Cook cholecystomy tube was advanced into the gallbladder fossa, coiled and locked. Bile was aspirated and a small amount of contrast was injected as several post procedural spot radiographic images were obtained in various obliquities. The catheter was secured to the skin with suture, connected to a drainage bag and a dressing was applied. The patient tolerated the procedure well without immediate post procedural complication. IMPRESSION: Successful ultrasound and fluoroscopic guided placement of a 10.2  French cholecystostomy tube. Electronically Signed   By: Sandi Mariscal M.D.   On: 09/17/2021 13:50   DG CHEST PORT 1 VIEW  Result Date: 09/17/2021 CLINICAL DATA:  Tachypnea EXAM: PORTABLE CHEST 1 VIEW COMPARISON:  Chest radiograph 09/16/2021 FINDINGS: The heart is mildly enlarged, unchanged. The upper mediastinal contours are normal. There is no focal consolidation or pulmonary edema. There is no pleural effusion or pneumothorax. There is unchanged asymmetric elevation of the right hemidiaphragm. The bones are stable. IMPRESSION: Stable chest with no radiographic evidence of acute cardiopulmonary process. Electronically Signed   By: Valetta Mole M.D.   On: 09/17/2021 08:13   DG CHEST PORT 1 VIEW  Result Date: 09/16/2021 CLINICAL DATA:  Acute abdominal pain. EXAM: PORTABLE CHEST 1 VIEW COMPARISON:  July 22, 2021. FINDINGS: Stable cardiomediastinal silhouette. Both lungs are clear. The visualized skeletal structures are unremarkable. IMPRESSION: No active disease. Electronically Signed   By: Bobbe Medico.D.  On: 09/16/2021 16:56    Medications / Allergies: per chart  Antibiotics: Anti-infectives (From admission, onward)    Start     Dose/Rate Route Frequency Ordered Stop   09/16/21 2300  piperacillin-tazobactam (ZOSYN) IVPB 3.375 g        3.375 g 12.5 mL/hr over 240 Minutes Intravenous Every 8 hours 09/16/21 1634     09/16/21 1645  piperacillin-tazobactam (ZOSYN) IVPB 3.375 g        3.375 g 100 mL/hr over 30 Minutes Intravenous STAT 09/16/21 1634 09/16/21 1746         Note: Portions of this report may have been transcribed using voice recognition software. Every effort was made to ensure accuracy; however, inadvertent computerized transcription errors may be present.   Any transcriptional errors that result from this process are unintentional.    Adin Hector, MD, FACS, MASCRS Esophageal, Gastrointestinal & Colorectal Surgery Robotic and Minimally Invasive Surgery  [facility] [place]  [facility] [address] [place], [place] [address] [phone] Fax [phone] Main  CONTACT INFORMATION:  Weekday (9AM-5PM): Call [facility] main office at [phone]  Weeknight (5PM-9AM) or Weekend/Holiday: Check [email or web address] (password " [email or web address]") for General Surgery [facility] coverage  (Please, do not use SecureChat as it is not reliable communication to operating surgeons for immediate patient care)      09/18/2021  9:44 AM

## 2021-09-18 NOTE — Evaluation (Signed)
Physical Therapy Evaluation Patient Details Name: Virginia Walters MRN: [id number] DOB: 1941-04-23 Today's Date: 09/18/2021  History of Present Illness  Patient is an 81 year old female admitted with Acute Cholecystitis.   IR consulted - percutaneous cholecystostomy, done 2/3  PMH includes COPD, chronic systolic heart failure  Clinical Impression  Pt admitted with above diagnosis.  Pt is agreeable to PT, more alert today and able to amb a short distance in room. Fatigues rapidly and requiring 2 person assist min-mod assist for basic tasks.  Recommend SNF at this time, may be able to progress to d/c home depending on LOS/assist available and activity tolerance. Continue PT in acute setting.  Pt currently with functional limitations due to the deficits listed below (see PT Problem List). Pt will benefit from skilled PT to increase their independence and safety with mobility to allow discharge to the venue listed below.          Recommendations for follow up therapy are one component of a multi-disciplinary discharge planning process, led by the attending physician.  Recommendations may be updated based on patient status, additional functional criteria and insurance authorization.  Follow Up Recommendations Skilled nursing-short term rehab (<3 hours/day)    Assistance Recommended at Discharge Frequent or constant Supervision/Assistance  Patient can return home with the following  A lot of help with walking and/or transfers;Help with stairs or ramp for entrance;Assist for transportation;A lot of help with bathing/dressing/bathroom;Assistance with cooking/housework    Equipment Recommendations None recommended by PT  Recommendations for Other Services       Functional Status Assessment Patient has had a recent decline in their functional status and demonstrates the ability to make significant improvements in function in a reasonable and predictable amount of time.     Precautions /  Restrictions Precautions Precautions: Fall;Other (comment) Precaution Comments: choleycystostomy, O2 Restrictions Weight Bearing Restrictions: No      Mobility  Bed Mobility Overal bed mobility: Needs Assistance Bed Mobility: Rolling, Sidelying to Sit Rolling: Min assist, Mod assist Sidelying to sit: Mod assist, +2 for physical assistance, +2 for safety/equipment, Min assist, HOB elevated       General bed mobility comments: incr time, multi-modal cues to perform partial roll to R and transition to sitting    Transfers Overall transfer level: Needs assistance Equipment used: Rolling walker (2 wheels) Transfers: Sit to/from Stand Sit to Stand: Mod assist, +2 physical assistance, +2 safety/equipment           General transfer comment: assist with anterior -superior wt shift and transition to RW, cues for hand placement    Ambulation/Gait Ambulation/Gait assistance: Min assist, +2 safety/equipment Gait Distance (Feet): 8 Feet Assistive device: Rolling walker (2 wheels) Gait Pattern/deviations: Step-through pattern, Decreased stride length       General Gait Details: cues for posture and step length, breathing. on 3L O2 with 2/4 DOE and SpO2=99%. fatigues quickly  Stairs            Wheelchair Mobility    Modified Rankin (Stroke Patients Only)       Balance Overall balance assessment: Needs assistance Sitting-balance support: Feet supported, Single extremity supported Sitting balance-Leahy Scale: Fair     Standing balance support: Reliant on assistive device for balance, During functional activity, Bilateral upper extremity supported Standing balance-Leahy Scale: Poor                               Pertinent Vitals/Pain Pain Assessment  Pain Assessment: No/denies pain    Home Living Family/patient expects to be discharged to:: Private residence Living Arrangements: Alone Available Help at Discharge: Neighbor;Available  PRN/intermittently   Home Access: Stairs to enter Entrance Stairs-Rails: Right Entrance Stairs-Number of Steps: 6   Home Layout: One level Home Equipment: Advice worker (2 wheels) Additional Comments: has cousin that assists her as needed    Prior Function Prior Level of Function : Independent/Modified Independent             Mobility Comments: ambulates with walker ADLs Comments: I with BADLs, neighbors assist with transportation     Hand Dominance        Extremity/Trunk Assessment   Upper Extremity Assessment Upper Extremity Assessment: Defer to OT evaluation;Generalized weakness    Lower Extremity Assessment Lower Extremity Assessment: Generalized weakness       Communication      Cognition Arousal/Alertness: Awake/alert Behavior During Therapy: WFL for tasks assessed/performed Overall Cognitive Status: Within Functional Limits for tasks assessed                                 General Comments: pt is oriented x3 today, conversing appropriately however with some possible STM deficits        General Comments      Exercises General Exercises - Lower Extremity Ankle Circles/Pumps: AROM, Both, 5 reps   Assessment/Plan    PT Assessment Patient needs continued PT services  PT Problem List Decreased strength;Decreased activity tolerance;Decreased balance;Decreased mobility;Cardiopulmonary status limiting activity       PT Treatment Interventions DME instruction;Therapeutic exercise;Gait training;Functional mobility training;Therapeutic activities;Patient/family education    PT Goals (Current goals can be found in the Care Plan section)  Acute Rehab PT Goals Patient Stated Goal: get stronger PT Goal Formulation: With patient Time For Goal Achievement: 10/02/21 Potential to Achieve Goals: Good    Frequency Min 2X/week     Co-evaluation               AM-PAC PT "6 Clicks" Mobility  Outcome Measure Help needed  turning from your back to your side while in a flat bed without using bedrails?: A Lot Help needed moving from lying on your back to sitting on the side of a flat bed without using bedrails?: A Lot Help needed moving to and from a bed to a chair (including a wheelchair)?: A Lot Help needed standing up from a chair using your arms (e.g., wheelchair or bedside chair)?: A Lot Help needed to walk in hospital room?: A Lot Help needed climbing 3-5 steps with a railing? : Total 6 Click Score: 11    End of Session   Activity Tolerance: Patient tolerated treatment well Patient left: in chair;with call bell/phone within reach;with chair alarm set;with nursing/sitter in room Nurse Communication: Mobility status PT Visit Diagnosis: Other abnormalities of gait and mobility (R26.89);Difficulty in walking, not elsewhere classified (R26.2)    Time: 3382-5053 PT Time Calculation (min) (ACUTE ONLY): 24 min   Charges:   PT Evaluation $PT Eval Low Complexity: 1 Low PT Treatments $Gait Training: 8-22 mins        Baxter Flattery, PT  Acute Rehab Dept ([place]) [phone] Pager [phone]  09/18/2021    [facility] 09/18/2021, 3:00 PM

## 2021-09-18 NOTE — Progress Notes (Signed)
Referring Physician(s): Hildreth  Supervising Physician: Sandi Mariscal  Patient Status:  [facility] - In-pt  Chief Complaint:  cholecystitis  Subjective: Pt doing ok; cont to have some wheezing but she states breathing has improved since yesterday; has some soreness at GB drain site as expected; denies fever,N/V   Allergies: Oxycodone  Medications: Prior to Admission medications   Medication Sig Start Date End Date Taking? Authorizing Provider  B Complex-Biotin-FA (B-COMPLEX PO) Take 1 capsule by mouth daily.    Yes [provider]  Calcium Carbonate-Vitamin D (CALCIUM + D PO) Take 1 capsule by mouth 2 (two) times daily.   Yes [provider]  Cholecalciferol (VITAMIN D) 2000 UNITS CAPS Take 1 capsule by mouth daily.   Yes [provider]  dorzolamide (TRUSOPT) 2 % ophthalmic solution Place 1 drop into the left eye 2 (two) times daily.   Yes [provider]  doxazosin (CARDURA) 4 MG tablet TAKE ONE-HALF TABLET BY  MOUTH DAILY Patient taking differently: Take 2 mg by mouth daily. 05/31/21  Yes Susy Frizzle, MD  ferrous sulfate 325 (65 FE) MG EC tablet Take 1 tablet (325 mg total) by mouth 2 (two) times daily. 02/21/20 09/16/21 Yes Susy Frizzle, MD  fish oil-omega-3 fatty acids 1000 MG capsule Take 1,000 mg by mouth daily.   Yes [provider]  hydrochlorothiazide (HYDRODIURIL) 25 MG tablet Take 1 tablet (25 mg total) by mouth daily. 03/05/20  Yes Susy Frizzle, MD  latanoprost (XALATAN) 0.005 % ophthalmic solution Place 1 drop into the left eye at bedtime.  12/05/17  Yes [provider]  lisinopril (ZESTRIL) 40 MG tablet TAKE 1 TABLET BY MOUTH  DAILY Patient taking differently: Take 40 mg by mouth daily. 06/07/21  Yes Susy Frizzle, MD  magnesium 30 MG tablet Take 30 mg by mouth 2 (two) times daily.   Yes [provider]  melatonin 5 MG TABS Take 5 mg by mouth at bedtime.   Yes [provider]  Multiple Vitamin (MULTIVITAMIN) tablet Take 1 tablet by mouth daily.   Yes [provider]  potassium chloride (KLOR-CON) 10 MEQ tablet TAKE 1 TABLET BY MOUTH  DAILY Patient taking differently: 10 mEq daily. 11/11/20  Yes Susy Frizzle, MD  pravastatin (PRAVACHOL) 40 MG tablet TAKE 1 TABLET BY MOUTH  DAILY Patient taking differently: Take 40 mg by mouth daily. 12/30/20  Yes Susy Frizzle, MD  traZODone (DESYREL) 50 MG tablet TAKE 1 TABLET BY MOUTH AT  BEDTIME Patient taking differently: Take 50 mg by mouth at bedtime. 01/28/21  Yes Susy Frizzle, MD  Turmeric 500 MG CAPS Take 500 mg by mouth daily.    Yes [provider]  vitamin E 180 MG (400 UNITS) capsule Take 400 Units by mouth daily.   Yes [provider]  SYMBICORT 160-4.5 MCG/ACT inhaler USE 2 INHALATIONS BY MOUTH  TWICE DAILY Patient not taking: Reported on 09/16/2021 03/04/20   Susy Frizzle, MD  venlafaxine XR (EFFEXOR-XR) 75 MG 24 hr capsule TAKE 1 CAPSULE BY MOUTH  DAILY WITH BREAKFAST 04/09/21   Susy Frizzle, MD  verapamil (CALAN-SR) 240 MG CR tablet TAKE 1 TABLET BY MOUTH AT  BEDTIME 07/05/21   Susy Frizzle, MD     Vital Signs: BP (!) 144/66 (BP Location: Right Arm)    Pulse 80    Temp 98.6 F (37 C) (Oral)    Resp 17    Ht 5'  5" (1.651 m)    Wt 234 lb 5.6 oz (106.3 kg)    SpO2 94%    BMI 39.00 kg/m   Physical Exam awake/alert; GB drain intact, insertion site ok, mildly tender, OP 275 cc blood-tinged bile; drain flushed without difficulty  Imaging: CT ABDOMEN PELVIS W CONTRAST  Result Date: 09/16/2021 CLINICAL DATA:  Acute abdominal pain. Right-sided pain and vomiting for 2 days EXAM: CT ABDOMEN AND PELVIS WITH CONTRAST TECHNIQUE: Multidetector CT imaging of the abdomen and pelvis was performed using the standard protocol following bolus administration of intravenous contrast. RADIATION DOSE REDUCTION: This exam was performed according to the departmental dose-optimization  program which includes automated exposure control, adjustment of the mA and/or kV according to patient size and/or use of iterative reconstruction technique. CONTRAST:  132mL OMNIPAQUE IOHEXOL 300 MG/ML  SOLN COMPARISON:  None. FINDINGS: Lower chest: No acute abnormality. Hepatobiliary: Diffuse low attenuation of the liver as can be seen with hepatic steatosis. No hepatic mass. No intrahepatic or extrahepatic biliary ductal dilatation. Distended gallbladder with cholelithiasis and pericholecystic inflammatory changes as can be seen with acute cholecystitis. Pancreas: Unremarkable. No pancreatic ductal dilatation or surrounding inflammatory changes. Spleen: Normal in size without focal abnormality. Adrenals/Urinary Tract: Normal adrenal glands. 4.5 cm right interpolar fluid attenuating renal mass consistent with a cyst. No urolithiasis or obstructive uropathy. Normal bladder. Stomach/Bowel: Moderate size hiatal hernia. Stomach is otherwise unremarkable. Appendix appears normal. No evidence of bowel wall thickening, distention, or inflammatory changes. Incidental note made of a duodenal diverticulum. Diverticulosis without evidence of diverticulitis. Vascular/Lymphatic: Aortic atherosclerosis. No enlarged abdominal or pelvic lymph nodes. Reproductive: Uterus and bilateral adnexa are unremarkable. Other: No abdominal wall hernia or abnormality. No abdominopelvic ascites. Musculoskeletal: No acute osseous abnormality. Chronic T12 and L4 vertebral body compression fractures. Anterior bridging osteophytes throughout the thoracolumbar spine as can be seen with diffuse idiopathic skeletal hyperostosis. Degenerative disease with disc height loss at L3-4, L4-5 and L5-S1. Severe bilateral facet arthropathy at L3-4, L4-5 and L5-S1. Severe bilateral foraminal stenosis at L3-4. Severe right foraminal stenosis at L4-5. Severe right foraminal stenosis at L5-S1. Moderate left foraminal stenosis at L5-S1. IMPRESSION: 1. Distended  gallbladder with cholelithiasis and pericholecystic inflammatory changes as can be seen with acute cholecystitis. 2. Hepatic steatosis. 3. Moderate size hiatal hernia. 4. Diverticulosis without evidence of diverticulitis. 5. Diffuse lumbar spine spondylosis as described above. 6. Aortic Atherosclerosis (ICD10-I70.0). Electronically Signed   By: Kathreen Devoid M.D.   On: 09/16/2021 15:17   IR Perc Cholecystostomy  Result Date: 09/17/2021 INDICATION: Acute cholecystitis. Poor operative candidate. Please perform image guided cholecystostomy tube placement for infection source control purposes. EXAM: ULTRASOUND AND FLUOROSCOPIC-GUIDED CHOLECYSTOSTOMY TUBE PLACEMENT COMPARISON:  None. MEDICATIONS: The patient is currently admitted to the hospital and on intravenous antibiotics. Antibiotics were administered within an appropriate time frame prior to skin puncture. ANESTHESIA/SEDATION: Moderate (conscious) sedation was employed during this procedure as administered by the Interventional Radiology RN. A total of Versed 0.25 mg and Fentanyl 25 mcg was administered intravenously. Moderate Sedation Time: 15 minutes. The patient's level of consciousness and vital signs were monitored continuously by radiology nursing throughout the procedure under my direct supervision. CONTRAST:  10 cc Omnipaque 300 - administered into the gallbladder lumen. FLUOROSCOPY TIME:  1 minute, 6 seconds (46 mGy) COMPLICATIONS: None immediate. PROCEDURE: Informed written consent was obtained from the patient after a discussion of the risks, benefits and alternatives to treatment. Questions regarding the procedure were encouraged and answered. A timeout was performed prior to the initiation  of the procedure. The right upper abdominal quadrant was prepped and draped in the usual sterile fashion, and a sterile drape was applied covering the operative field. Maximum barrier sterile technique with sterile gowns and gloves were used for the procedure. A  timeout was performed prior to the initiation of the procedure. Local anesthesia was provided with 1% lidocaine with epinephrine. Ultrasound scanning of the right upper quadrant demonstrates a markedly dilated gallbladder. Of note, the patient reported pain with ultrasound imaging over the gallbladder. Utilizing a transhepatic approach, a 22 gauge needle was advanced into the gallbladder under direct ultrasound guidance. An ultrasound image was saved for documentation purposes. Appropriate intraluminal puncture was confirmed with the efflux of bile and advancement of an 0.018 wire into the gallbladder lumen. The needle was exchanged for an [place] set. A small amount of contrast was injected to confirm appropriate intraluminal positioning. Over a Benson wire, a 70.2-French Cook cholecystomy tube was advanced into the gallbladder fossa, coiled and locked. Bile was aspirated and a small amount of contrast was injected as several post procedural spot radiographic images were obtained in various obliquities. The catheter was secured to the skin with suture, connected to a drainage bag and a dressing was applied. The patient tolerated the procedure well without immediate post procedural complication. IMPRESSION: Successful ultrasound and fluoroscopic guided placement of a 10.2 French cholecystostomy tube. Electronically Signed   By: Sandi Mariscal M.D.   On: 09/17/2021 13:50   DG CHEST PORT 1 VIEW  Result Date: 09/17/2021 CLINICAL DATA:  Tachypnea EXAM: PORTABLE CHEST 1 VIEW COMPARISON:  Chest radiograph 09/16/2021 FINDINGS: The heart is mildly enlarged, unchanged. The upper mediastinal contours are normal. There is no focal consolidation or pulmonary edema. There is no pleural effusion or pneumothorax. There is unchanged asymmetric elevation of the right hemidiaphragm. The bones are stable. IMPRESSION: Stable chest with no radiographic evidence of acute cardiopulmonary process. Electronically Signed   By: Valetta Mole  M.D.   On: 09/17/2021 08:13   DG CHEST PORT 1 VIEW  Result Date: 09/16/2021 CLINICAL DATA:  Acute abdominal pain. EXAM: PORTABLE CHEST 1 VIEW COMPARISON:  July 22, 2021. FINDINGS: Stable cardiomediastinal silhouette. Both lungs are clear. The visualized skeletal structures are unremarkable. IMPRESSION: No active disease. Electronically Signed   By: Marijo Conception M.D.   On: 09/16/2021 16:56    Labs:  CBC: Recent Labs    07/22/21 1854 07/22/21 1903 07/27/21 1440 09/16/21 1354 09/17/21 0723  WBC 7.3  --  6.7 20.1* 20.7*  HGB 14.3 13.9   14.3 14.9 13.6 12.5  HCT 42.1 41.0   42.0 44.2 41.2 38.3  PLT 185  --  252 208 181    COAGS: Recent Labs    07/22/21 1854 09/17/21 0940  INR 1.1 1.4*  APTT 31  --     BMP: Recent Labs    11/13/20 1438 11/13/20 1438 07/22/21 1854 07/22/21 1903 07/27/21 1440 09/16/21 1354 09/17/21 0723  NA 141  --  136 139   138 141 136 136  K 4.7  --  3.5 3.6   3.5 4.2 3.6 3.5  CL 107  --  104 103 106 102 103  CO2 23  --  23  --  23 23 23   GLUCOSE 112*  --  145* 144* 102* 131* 158*  BUN 24  --  11 12 18 17 18   CALCIUM 9.9  --  9.6  --  9.4 9.6 8.7*  CREATININE 1.31*   < >  1.17* 1.10* 1.07* 0.81 0.99  GFRNONAA 39*  --  47*  --   --  >60 58*  GFRAA 45*  --   --   --   --   --   --    < > = values in this interval not displayed.    LIVER FUNCTION TESTS: Recent Labs    07/22/21 1854 07/27/21 1440 09/16/21 1354 09/17/21 0723  BILITOT 0.8 0.4 0.8 1.1  AST 22 32 16 19  ALT 25 36* 19 19  ALKPHOS 68  --  54 60  PROT 6.7 6.7 7.5 6.6  ALBUMIN 3.5  --  3.9 3.3*    Assessment and Plan: Pt with hx acute cholecystitis, poor surgical candidate; s/p perc GB drain 2/3; afebrile; no new labs today; CXR yesterday- no acute process; bile cx pend  Drain Location: RUQ Size: Fr size: 10 Fr Date of placement: 09/17/21  Currently to: Drain collection device: gravity 24 hour output:  Output by Drain (mL) 09/16/21 0701 - 09/16/21 1900 09/16/21 1901 -  09/17/21 0700 09/17/21 0701 - 09/17/21 1900 09/17/21 1901 - 09/18/21 0700 09/18/21 0701 - 09/18/21 0844  Biliary Tube Cook slip-coat 10 Fr. RLQ   225 50       Current examination: Flushes/aspirates easily.  Insertion site unremarkable. Suture and stat lock in place. Dressed appropriately.   Plan: Continue TID flushes with 5 cc NS. Record output Q shift. Dressing changes QD or PRN if soiled.  Call IR APP or on call IR MD if difficulty flushing or sudden change in drain output.  Repeat imaging/possible drain injection once output < 10 mL/QD (excluding flush material.); pt will need to keep drain for at least 4-6 weeks to allow for tract maturation unless GB removed surgically in interim; f/u cholangiogram in 4-6 weeks; other plans per [facility]  Discharge planning: Please contact IR APP or on call IR MD prior to patient d/c to ensure appropriate follow up plans are in place. Typically patient will follow up with IR clinic 10-14 days post d/c for repeat imaging/possible drain injection. IR scheduler will contact patient with date/time of appointment. Patient will need to flush drain QD with 5 cc NS, record output QD, dressing changes every 2-3 days or earlier if soiled.   IR will continue to follow - please call with questions or concerns.      Electronically Signed: D. Rowe Robert, PA-C 09/18/2021, 8:40 AM   I spent a total of 15 Minutes at the the patient's bedside AND on the patient's hospital floor or unit, greater than 50% of which was counseling/coordinating care for gallbladder drain    Patient ID: Virginia Walters, female   DOB: 1941/02/21, 81 y.o.   MRN: [id number]

## 2021-09-18 NOTE — Assessment & Plan Note (Addendum)
Continue home trazodone and melatonin.

## 2021-09-19 LAB — CBC
HCT: 36.1 % (ref 36.0–46.0)
Hemoglobin: 11.6 g/dL — ABNORMAL LOW (ref 12.0–15.0)
MCH: 30.1 pg (ref 26.0–34.0)
MCHC: 32.1 g/dL (ref 30.0–36.0)
MCV: 93.5 fL (ref 80.0–100.0)
Platelets: 172 10*3/uL (ref 150–400)
RBC: 3.86 MIL/uL — ABNORMAL LOW (ref 3.87–5.11)
RDW: 12.5 % (ref 11.5–15.5)
WBC: 11 10*3/uL — ABNORMAL HIGH (ref 4.0–10.5)
nRBC: 0 % (ref 0.0–0.2)

## 2021-09-19 LAB — COMPREHENSIVE METABOLIC PANEL
ALT: 33 U/L (ref 0–44)
AST: 33 U/L (ref 15–41)
Albumin: 2.9 g/dL — ABNORMAL LOW (ref 3.5–5.0)
Alkaline Phosphatase: 79 U/L (ref 38–126)
Anion gap: 9 (ref 5–15)
BUN: 23 mg/dL (ref 8–23)
CO2: 26 mmol/L (ref 22–32)
Calcium: 8.9 mg/dL (ref 8.9–10.3)
Chloride: 102 mmol/L (ref 98–111)
Creatinine, Ser: 0.96 mg/dL (ref 0.44–1.00)
GFR, Estimated: 60 mL/min — ABNORMAL LOW (ref >60)
Glucose, Bld: 101 mg/dL — ABNORMAL HIGH (ref 70–99)
Potassium: 3.2 mmol/L — ABNORMAL LOW (ref 3.5–5.1)
Sodium: 137 mmol/L (ref 135–145)
Total Bilirubin: 0.6 mg/dL (ref 0.3–1.2)
Total Protein: 6.2 g/dL — ABNORMAL LOW (ref 6.5–8.1)

## 2021-09-19 MED ORDER — METHOCARBAMOL 1000 MG/10ML IJ SOLN
1000.0000 mg | Freq: Four times a day (QID) | INTRAVENOUS | Status: DC | PRN
  Filled 2021-09-19: qty 10

## 2021-09-19 MED ORDER — LISINOPRIL 20 MG PO TABS
40.0000 mg | ORAL_TABLET | Freq: Every day | ORAL | Status: DC
  Administered 2021-09-19 – 2021-09-21 (×3): 40 mg via ORAL
  Filled 2021-09-19 (×3): qty 2

## 2021-09-19 MED ORDER — HYDROMORPHONE HCL 1 MG/ML IJ SOLN: 0.5000 mg | INTRAMUSCULAR | Status: DC | PRN

## 2021-09-19 MED ORDER — METHOCARBAMOL 500 MG PO TABS: 1000.0000 mg | ORAL_TABLET | Freq: Four times a day (QID) | ORAL | Status: DC | PRN

## 2021-09-19 MED ORDER — ENSURE SURGERY PO LIQD
237.0000 mL | Freq: Two times a day (BID) | ORAL | Status: DC
  Administered 2021-09-19: 237 mL via ORAL

## 2021-09-19 MED ORDER — POLYETHYLENE GLYCOL 3350 17 G PO PACK
17.0000 g | PACK | Freq: Two times a day (BID) | ORAL | Status: DC
  Administered 2021-09-19 – 2021-09-21 (×4): 17 g via ORAL
  Filled 2021-09-19 (×4): qty 1

## 2021-09-19 NOTE — Progress Notes (Signed)
Virginia Walters [id number] [birth year]  CARE TEAM:  PCP: Susy Frizzle, MD  Outpatient Care Team: Patient Care Team: Susy Frizzle, MD as PCP - General (Family Medicine) Alisa Graff, FNP as Nurse Practitioner (Family Medicine) Theora Gianotti, NP as Nurse Practitioner (Cardiology) Edythe Clarity, [facility] as Pharmacist (Pharmacist)  Inpatient Treatment Team: Treatment Team: Attending Provider: Mariel Aloe, MD; Rounding Team: Nolon Nations, MD; Registered Nurse: Heloise Ochoa, RN; Registered Nurse: Robb Matar, RN; Rounding Team: Ian Bushman, MD; Rounding Team: Dorthy Cooler Radiology, MD; Consulting Physician: Dorthy Cooler Radiology, MD; Registered Nurse: Nelida Meuse, RN; Physical Therapist: Neil Crouch, PT; Technician: Karna Christmas, NT; Utilization Review: Claudie Leach, RN; Registered Nurse: Tanda Rockers, RN   Problem List:   Principal Problem:   Acute cholecystitis Active Problems:   HTN (hypertension)   Dyslipidemia   Dementia with behavioral problem   Chronic systolic heart failure (HCC)   COPD exacerbation ([place])   COPD with chronic bronchitis ([place])   Chronic kidney disease, stage 3a ([place])   Tachypnea   Insomnia      * No surgery found *      Assessment  Slowly improving  [facility] Stay = 2 days)  Assessment/Plan  Acute Cholecystitis  - CT 09/16/2021 Dx  -IV antibiotics for 5 days minimum piperacillin/tazobactam per protocol given severe cholecystitis and fragile patient. - WBC normalizing - follow - LFTs all still normal - Solid diet as tolerated - Heart healthy - IR consulted - percutaneous cholecystostomy, done 2/3 -Delay consideration of cholecystectomy until 6-8 weeks which will also allow for medical optimization prior to surgical intervention in this fragile deconditioned [place].  Would need medical and pulmonary clearance preop.   -Surgery to help follow.   FEN -  IVF per [facility]  but I would lean towards ML IVF with as needed backup.  Keep her on the dry side with her cardiac & esp pulmonary issues.  I would start with clear liquids and advance gradually.   VTE - SCDs, okay for chemical prophylaxis from a general surgery standpoint  ID - Zosyn x 5 d.  2/2>   COPD:  Per primary serivice -keeping on dry side - CXR without active disease - on [place]  - CHF (last EF 2019 w/ EF 45-50% w/ diffuse hypokinesis, NM study in 2020 with EF ~61%)  PFO HTN  HLD  Disposition Per primary service.  Strongly suspect patient will need SNF for recovery.  Physical therapy agrees.  I reviewed hospitalist notes, last 24 h vitals and pain scores, last 48 h intake and output, last 24 h labs and trends, and last 24 h imaging results.   This care required moderate level of medical decision making.           25 minutes spent in review, evaluation, examination, counseling, and coordination of care.   I have reviewed this patient's available data, including medical history, events of note, physical examination and test results as part of my evaluation.  A significant portion of that time was spent in counseling.  Care during the described time interval was provided by me.  09/19/2021    Subjective: (Chief complaint)  Pain less  Two-person assist with PT eval  Having bowel movements.  Tolerating liquids.  Wanting to try solid.  Objective:  Vital signs:  Vitals:   09/19/21 0241 09/19/21 0500 09/19/21 0552 09/19/21 0749  BP:   (!) 149/69   Pulse:   (!) 51  Resp:   18   Temp:   97.9 F (36.6 C)   TempSrc:   Oral   SpO2: 98%  99% 100%  Weight:  107.3 kg    Height:        Last BM Date: 09/16/21  Intake/Output   Yesterday:  02/04 0701 - 02/05 0700 In: 561.5 [P.O.:400; IV Piggyback:156.5] Out: 912 [Urine:800; Drains:112] This shift:  No intake/output data recorded.  Bowel function:  Flatus: YES  BM:  YES  Drain: Bilious   Physical Exam:  General: Pt  awake and more alert.  no acute distress Eyes: PERRL, normal EOM.  Sclera clear.  No icterus Neuro: CN II-XII intact w/o focal sensory/motor deficits. Lymph: No head/neck/groin lymphadenopathy Psych:  No delerium/psychosis/paranoia.  Oriented x 4 HENT: Normocephalic, Mucus membranes moist.  No thrush Neck: Supple, No tracheal deviation.  No obvious thyromegaly Chest: No pain to chest wall compression.  Good respiratory excursion.  No audible wheezing CV:  Pulses intact.  Regular rhythm.  No major extremity edema MS: Normal AROM mjr joints.  No obvious deformity  Abdomen: Obese but Soft.  Mildy distended.  Tenderness at RUQ drain site .  Rest of the abdomen nontender.  No evidence of peritonitis.  No incarcerated hernias.  Ext:   No deformity.  No mjr edema.  No cyanosis Skin: No petechiae / purpurea.  No major sores.  Warm and dry    Results:   Cultures: Recent Results (from the past 720 hour(s))  Resp Panel by RT-PCR (Flu A&B, Covid) Nasopharyngeal Swab     Status: None   Collection Time: 09/16/21  3:29 PM   Specimen: Nasopharyngeal Swab; Nasopharyngeal(NP) swabs in vial transport medium  Result Value Ref Range Status   SARS Coronavirus 2 by RT PCR NEGATIVE NEGATIVE Final    Comment: (NOTE) SARS-CoV-2 target nucleic acids are NOT DETECTED.  The SARS-CoV-2 RNA is generally detectable in upper respiratory specimens during the acute phase of infection. The lowest concentration of SARS-CoV-2 viral copies this assay can detect is 138 copies/mL. A negative result does not preclude SARS-Cov-2 infection and should not be used as the sole basis for treatment or other patient management decisions. A negative result may occur with  improper specimen collection/handling, submission of specimen other than nasopharyngeal swab, presence of viral mutation(s) within the areas targeted by this assay, and inadequate number of viral copies(<138 copies/mL). A negative result must be combined  with clinical observations, patient history, and epidemiological information. The expected result is Negative.  Fact Sheet for Patients:  EntrepreneurPulse.com.au  Fact Sheet for Healthcare Providers:  IncredibleEmployment.be  This test is no t yet approved or cleared by the [place] FDA and  has been authorized for detection and/or diagnosis of SARS-CoV-2 by FDA under an Emergency Use Authorization (EUA). This EUA will remain  in effect (meaning this test can be used) for the duration of the COVID-19 declaration under Section 564(b)(1) of the Act, 21 U.S.C.section 360bbb-3(b)(1), unless the authorization is terminated  or revoked sooner.       Influenza A by PCR NEGATIVE NEGATIVE Final   Influenza B by PCR NEGATIVE NEGATIVE Final    Comment: (NOTE) The Xpert Xpress SARS-CoV-2/FLU/RSV plus assay is intended as an aid in the diagnosis of influenza from Nasopharyngeal swab specimens and should not be used as a sole basis for treatment. Nasal washings and aspirates are unacceptable for Xpert Xpress SARS-CoV-2/FLU/RSV testing.  Fact Sheet for Patients: EntrepreneurPulse.com.au  Fact Sheet for Healthcare Providers: IncredibleEmployment.be  This  test is not yet approved or cleared by the [place] and has been authorized for detection and/or diagnosis of SARS-CoV-2 by FDA under an Emergency Use Authorization (EUA). This EUA will remain in effect (meaning this test can be used) for the duration of the COVID-19 declaration under Section 564(b)(1) of the Act, 21 U.S.C. section 360bbb-3(b)(1), unless the authorization is terminated or revoked.  Performed at [facility], [place] [address], [place], [place] [address]   Culture, blood (routine x 2)     Status: None (Preliminary result)   Collection Time: 09/16/21  6:20 PM   Specimen: BLOOD RIGHT FOREARM  Result Value Ref Range  Status   Specimen Description   Final    BLOOD RIGHT FOREARM BLOOD Performed at [place] [address] [place], [place] [address]    Special Requests   Final    BOTTLES DRAWN AEROBIC AND ANAEROBIC Blood Culture adequate volume Performed at [place] [address], [place], [place] [address]    Culture   Final    NO GROWTH 2 DAYS Performed at [place] [address] [place], [place] [address]    Report Status PENDING  Incomplete  Culture, blood (routine x 2)     Status: None (Preliminary result)   Collection Time: 09/16/21  6:24 PM   Specimen: BLOOD  Result Value Ref Range Status   Specimen Description   Final    BLOOD RIGHT ANTECUBITAL Performed at [facility] [address], [place], [place] [address]    Special Requests   Final    BOTTLES DRAWN AEROBIC AND ANAEROBIC Blood Culture adequate volume Performed at [place] [address] [place], [place] [address]    Culture   Final    NO GROWTH 2 DAYS Performed at [facility] [address], [place], [place] [address]    Report Status PENDING  Incomplete  Body fluid culture w Gram Stain     Status: None (Preliminary result)   Collection Time: 09/18/21  8:26 AM   Specimen: BILE; Body Fluid  Result Value Ref Range Status   Specimen Description   Final    BILE Performed at [facility] [address] [place], [place] [address]    Special Requests   Final    Normal Performed at [facility], [place] [address], [place], [place] [address]    Gram Stain   Final    MODERATE WBC PRESENT, PREDOMINANTLY PMN RARE GRAM VARIABLE ROD    Culture   Final    CULTURE REINCUBATED FOR BETTER GROWTH Performed at [facility], [place] [address] [place], [place] [address]    Report Status PENDING  Incomplete    Labs: Results for orders placed or performed during the hospital encounter  of 09/16/21 (from the past 48 hour(s))  Protime-INR     Status: Abnormal   Collection Time: 09/17/21  9:40 AM  Result Value Ref Range   Prothrombin Time 17.0 (H) 11.4 - 15.2 seconds   INR 1.4 (H) 0.8 - 1.2    Comment: (NOTE) INR goal varies based on device and disease states. Performed at [facility], [place] [address], [place], [place] [address]   Blood gas, arterial     Status: Abnormal   Collection Time: 09/17/21  5:22 PM  Result Value Ref Range   pH, Arterial 7.377 7.350 - 7.450   pCO2  arterial 42.7 32.0 - 48.0 mmHg   pO2, Arterial 78.1 (L) 83.0 - 108.0 mmHg   Bicarbonate 24.5 20.0 - 28.0 mmol/L   Acid-base deficit 0.2 0.0 - 2.0 mmol/L   O2 Saturation 95.1 %   Patient temperature 98.6    Allens test (pass/fail) PASS PASS    Comment: Performed at [facility], [place] [address], [place], [place] [address]  Body fluid culture w Gram Stain     Status: None (Preliminary result)   Collection Time: 09/18/21  8:26 AM   Specimen: BILE; Body Fluid  Result Value Ref Range   Specimen Description      BILE Performed at [facility], [place] [address], [place], [place] [address]    Special Requests      Normal Performed at [facility], [place] [address] [place], [place] [address]    Gram Stain      MODERATE WBC PRESENT, PREDOMINANTLY PMN RARE GRAM VARIABLE ROD    Culture      CULTURE REINCUBATED FOR BETTER GROWTH Performed at [facility], [place] [address], [place], [place] [address]    Report Status PENDING   CBC with Differential/Platelet     Status: Abnormal   Collection Time: 09/18/21 10:12 AM  Result Value Ref Range   WBC 11.4 (H) 4.0 - 10.5 K/uL   RBC 3.69 (L) 3.87 - 5.11 MIL/uL   Hemoglobin 11.1 (L) 12.0 - 15.0 g/dL   HCT 35.1 (L) 36.0 - 46.0 %   MCV 95.1 80.0 - 100.0 fL   MCH 30.1 26.0 - 34.0 pg   MCHC 31.6 30.0 - 36.0 g/dL   RDW 12.8 11.5 - 15.5 %   Platelets 167 150 - 400 K/uL   nRBC 0.0  0.0 - 0.2 %   Neutrophils Relative % 81 %   Neutro Abs 9.1 (H) 1.7 - 7.7 K/uL   Lymphocytes Relative 7 %   Lymphs Abs 0.8 0.7 - 4.0 K/uL   Monocytes Relative 10 %   Monocytes Absolute 1.2 (H) 0.1 - 1.0 K/uL   Eosinophils Relative 1 %   Eosinophils Absolute 0.1 0.0 - 0.5 K/uL   Basophils Relative 0 %   Basophils Absolute 0.0 0.0 - 0.1 K/uL   Immature Granulocytes 1 %   Abs Immature Granulocytes 0.08 (H) 0.00 - 0.07 K/uL    Comment: Performed at [facility], [place] [address] [place], [place] [address]  Comprehensive metabolic panel     Status: Abnormal   Collection Time: 09/19/21  5:01 AM  Result Value Ref Range   Sodium 137 135 - 145 mmol/L   Potassium 3.2 (L) 3.5 - 5.1 mmol/L   Chloride 102 98 - 111 mmol/L   CO2 26 22 - 32 mmol/L   Glucose, Bld 101 (H) 70 - 99 mg/dL    Comment: Glucose reference range applies only to samples taken after fasting for at least 8 hours.   BUN 23 8 - 23 mg/dL   Creatinine, Ser 0.96 0.44 - 1.00 mg/dL   Calcium 8.9 8.9 - 10.3 mg/dL   Total Protein 6.2 (L) 6.5 - 8.1 g/dL   Albumin 2.9 (L) 3.5 - 5.0 g/dL   AST 33 15 - 41 U/L   ALT 33 0 - 44 U/L   Alkaline Phosphatase 79 38 - 126 U/L   Total Bilirubin 0.6 0.3 - 1.2 mg/dL   GFR, Estimated 60 (L) >60 mL/min    Comment: (NOTE) Calculated using  the CKD-EPI Creatinine Equation (2021)    Anion gap 9 5 - 15    Comment: Performed at [facility], [place] [address], [place], [place] [address]  CBC     Status: Abnormal   Collection Time: 09/19/21  5:01 AM  Result Value Ref Range   WBC 11.0 (H) 4.0 - 10.5 K/uL   RBC 3.86 (L) 3.87 - 5.11 MIL/uL   Hemoglobin 11.6 (L) 12.0 - 15.0 g/dL   HCT 36.1 36.0 - 46.0 %   MCV 93.5 80.0 - 100.0 fL   MCH 30.1 26.0 - 34.0 pg   MCHC 32.1 30.0 - 36.0 g/dL   RDW 12.5 11.5 - 15.5 %   Platelets 172 150 - 400 K/uL   nRBC 0.0 0.0 - 0.2 %    Comment: Performed at [facility], [place] [address] [place], [place] [address]     Imaging / Studies: IR Perc Cholecystostomy  Result Date: 09/17/2021 INDICATION: Acute cholecystitis. Poor operative candidate. Please perform image guided cholecystostomy tube placement for infection source control purposes. EXAM: ULTRASOUND AND FLUOROSCOPIC-GUIDED CHOLECYSTOSTOMY TUBE PLACEMENT COMPARISON:  None. MEDICATIONS: The patient is currently admitted to the hospital and on intravenous antibiotics. Antibiotics were administered within an appropriate time frame prior to skin puncture. ANESTHESIA/SEDATION: Moderate (conscious) sedation was employed during this procedure as administered by the Interventional Radiology RN. A total of Versed 0.25 mg and Fentanyl 25 mcg was administered intravenously. Moderate Sedation Time: 15 minutes. The patient's level of consciousness and vital signs were monitored continuously by radiology nursing throughout the procedure under my direct supervision. CONTRAST:  10 cc Omnipaque 300 - administered into the gallbladder lumen. FLUOROSCOPY TIME:  1 minute, 6 seconds (46 mGy) COMPLICATIONS: None immediate. PROCEDURE: Informed written consent was obtained from the patient after a discussion of the risks, benefits and alternatives to treatment. Questions regarding the procedure were encouraged and answered. A timeout was performed prior to the initiation of the procedure. The right upper abdominal quadrant was prepped and draped in the usual sterile fashion, and a sterile drape was applied covering the operative field. Maximum barrier sterile technique with sterile gowns and gloves were used for the procedure. A timeout was performed prior to the initiation of the procedure. Local anesthesia was provided with 1% lidocaine with epinephrine. Ultrasound scanning of the right upper quadrant demonstrates a markedly dilated gallbladder. Of note, the patient reported pain with ultrasound imaging over the gallbladder. Utilizing a transhepatic approach, a 22 gauge needle was  advanced into the gallbladder under direct ultrasound guidance. An ultrasound image was saved for documentation purposes. Appropriate intraluminal puncture was confirmed with the efflux of bile and advancement of an 0.018 wire into the gallbladder lumen. The needle was exchanged for an Liberty set. A small amount of contrast was injected to confirm appropriate intraluminal positioning. Over a Benson wire, a 45.2-French Cook cholecystomy tube was advanced into the gallbladder fossa, coiled and locked. Bile was aspirated and a small amount of contrast was injected as several post procedural spot radiographic images were obtained in various obliquities. The catheter was secured to the skin with suture, connected to a drainage bag and a dressing was applied. The patient tolerated the procedure well without immediate post procedural complication. IMPRESSION: Successful ultrasound and fluoroscopic guided placement of a 10.2 French cholecystostomy tube. Electronically Signed   By: Sandi Mariscal M.D.   On: 09/17/2021 13:50    Medications / Allergies: per chart  Antibiotics: Anti-infectives (From admission, onward)  Start     Dose/Rate Route Frequency Ordered Stop   09/16/21 2300  piperacillin-tazobactam (ZOSYN) IVPB 3.375 g        3.375 g 12.5 mL/hr over 240 Minutes Intravenous Every 8 hours 09/16/21 1634     09/16/21 1645  piperacillin-tazobactam (ZOSYN) IVPB 3.375 g        3.375 g 100 mL/hr over 30 Minutes Intravenous STAT 09/16/21 1634 09/16/21 1746         Note: Portions of this report may have been transcribed using voice recognition software. Every effort was made to ensure accuracy; however, inadvertent computerized transcription errors may be present.   Any transcriptional errors that result from this process are unintentional.    Adin Hector, MD, FACS, MASCRS Esophageal, Gastrointestinal & Colorectal Surgery Robotic and Minimally Invasive Surgery  [facility], [place]  [place]. [address] [place], [place] [address] [phone] Fax [phone] Main  CONTACT INFORMATION:  Weekday (9AM-5PM): Call [facility] main office at [phone]  Weeknight (5PM-9AM) or Weekend/Holiday: Check [email or web address] (password " [email or web address]") for General Surgery [facility] coverage  (Please, do not use SecureChat as it is not reliable communication to operating surgeons for immediate patient care)      09/19/2021  8:18 AM

## 2021-09-19 NOTE — Progress Notes (Signed)
Physical Therapy Treatment Patient Details Name: Virginia Walters MRN: [id number] DOB: [birth year] Today's Date: 09/19/2021   History of Present Illness Patient is an 81 year old female admitted with Acute Cholecystitis.   IR consulted - percutaneous cholecystotomy, done 2/3  PMH includes COPD, chronic systolic heart failure    PT Comments    Pt continuous pulse ox monitor alarming on arrival, ready HR 182. Retrieved dinamap and HR in 60s to 70s. O2 finger probe replaced end of session with HR 68 and SpO2=97% on 2L. Pt with much improved activity tolerance and incr gait distance today-- amb ~ 60' with min assist. See below for details.  Pt is motivated to mobilize and desires to d/c home. Will leave recommendations as SNF but may be able to d/c home with HHPT pending progress and LOS     Recommendations for follow up therapy are one component of a multi-disciplinary discharge planning process, led by the attending physician.  Recommendations may be updated based on patient status, additional functional criteria and insurance authorization.  Follow Up Recommendations  Skilled nursing-short term rehab (<3 hours/day) (vs HHPT)     Assistance Recommended at Discharge Intermittent Supervision/Assistance  Patient can return home with the following A little help with walking and/or transfers;Help with stairs or ramp for entrance;A little help with bathing/dressing/bathroom;Assistance with cooking/housework;Assist for transportation   Equipment Recommendations  None recommended by PT    Recommendations for Other Services       Precautions / Restrictions Precautions Precautions: Other (comment) Precaution Comments: choleycystotomy-drain, O2 Restrictions Weight Bearing Restrictions: No     Mobility  Bed Mobility               General bed mobility comments: OOB in recliner on arrival    Transfers Overall transfer level: Needs assistance Equipment used: Rolling walker (2  wheels) Transfers: Sit to/from Stand Sit to Stand: Min assist           General transfer comment: assist with anterior -superior wt shift and transition to RW, cues for hand placement    Ambulation/Gait Ambulation/Gait assistance: Min assist, Min guard Gait Distance (Feet): 60 Feet Assistive device: Rolling walker (2 wheels) Gait Pattern/deviations: Step-through pattern, Decreased stride length, Trunk flexed       General Gait Details: cues for posture and step length, breathing. on 2L O2 , denies DOE, SpO2=97%. HR 68-75   Stairs             Wheelchair Mobility    Modified Rankin (Stroke Patients Only)       Balance   Sitting-balance support: Single extremity supported, No upper extremity supported Sitting balance-Leahy Scale: Good     Standing balance support: Reliant on assistive device for balance, During functional activity, Bilateral upper extremity supported Standing balance-Leahy Scale: Fair                              Cognition Arousal/Alertness: Awake/alert Behavior During Therapy: WFL for tasks assessed/performed Overall Cognitive Status: Within Functional Limits for tasks assessed                                 General Comments: oriented, follows commands and recalls seeing PT and rehab tech yesterday        Exercises      General Comments        Pertinent Vitals/Pain Pain Assessment Pain Assessment: No/denies  pain    Home Living                          Prior Function            PT Goals (current goals can now be found in the care plan section) Acute Rehab PT Goals Patient Stated Goal: get stronger PT Goal Formulation: With patient Time For Goal Achievement: 10/02/21 Potential to Achieve Goals: Good Progress towards PT goals: Progressing toward goals    Frequency    Min 2X/week      PT Plan Current plan remains appropriate    Co-evaluation              AM-PAC PT "6  Clicks" Mobility   Outcome Measure  Help needed turning from your back to your side while in a flat bed without using bedrails?: A Lot Help needed moving from lying on your back to sitting on the side of a flat bed without using bedrails?: A Lot Help needed moving to and from a bed to a chair (including a wheelchair)?: A Little Help needed standing up from a chair using your arms (e.g., wheelchair or bedside chair)?: A Little Help needed to walk in hospital room?: A Little Help needed climbing 3-5 steps with a railing? : A Lot 6 Click Score: 15    End of Session Equipment Utilized During Treatment: Gait belt Activity Tolerance: Patient tolerated treatment well Patient left: in chair;with call bell/phone within reach;with chair alarm set Nurse Communication: Mobility status PT Visit Diagnosis: Other abnormalities of gait and mobility (R26.89);Difficulty in walking, not elsewhere classified (R26.2)     Time: 7096-4383 PT Time Calculation (min) (ACUTE ONLY): 16 min  Charges:  $Gait Training: 8-22 mins                     Baxter Flattery, PT  Acute Rehab Dept ([place]) [phone] Pager [phone]  09/19/2021    [facility] 09/19/2021, 11:10 AM

## 2021-09-19 NOTE — Progress Notes (Signed)
PROGRESS NOTE    [name]  XBJ:[id number] DOB: [birth year] DOA: 09/16/2021 PCP: Susy Frizzle, MD   Brief Narrative: Virginia Walters is a 81 y.o. female with a history of COPD, chronic heart failure, memory loss vs dementia. Patient presented from her PCP's office secondary to abdominal pain with evidence of acute cholecystitis on CT imaging. General surgery consulted who recommended IR consult for percutaneous cholecystostomy which was placed on 2/3. Patient with associated tachypnea and possible COPD exacerbation which has improved.   Assessment and Plan: * Acute cholecystitis s/p perc cholecystotomy drain 09/17/2021 Diagnosed on CT abdomen/pelvis. No evidence of choledocholithiasis on imaging. ALP, AST, ALT wnl. Associated leukocytosis. General surgery consulted and recommended IR consult for percutaneous cholecystostomy, which was completed on 2/3. Leukocytosis improving. Blood cultures with no growth to date. IR recommendations (2/4) reviewed. -General surgery recommendations: Zosyn, solid diet (heart healthy)  Tachypnea Possibly related to COPD exacerbation as patient developed worsening wheezing. Per patient, tachypnea was worse than baseline. No associated tachycardia or fluid overload. Repeat chest x-ray looked clear. Tachypnea has improved. -See problem, COPD exacerbation   COPD exacerbation (Virginia Walters) Patient with worsening wheezing and tachypnea. No cough or sputum production. Wheezing seems more upper airway than lower. Completed 3 days of Solu-medrol. -Scheduled Duonebs -Discontinue Solu-medrol -Mobilization -Flutter valve  COPD with chronic bronchitis (Virginia Walters)- (present on admission) Patient has previously required oxygen which has been discontinued. Patient was prescribed Symbicort as an outpatient but is currently not using. -See problem, COPD exacerbation  Chronic systolic heart failure (Virginia Walters)- (present on admission) Euvolemic. Patient's last LVEF mildly reduced  at 45-50% in 2019. On lisinopril. Not on diuretic therapy. Noted that patient is on verapamil for blood pressure control. Weight on admission of 238.1 lbs (108 kg). BNP slightly elevated. Weight of 236.6 lbs which is slightly up. -Daily weights, strict in out  Chronic kidney disease, stage 3a (HCC) Creatinine stable and at baseline.  Dementia with behavioral problem- (present on admission) Noted on history. Patient lives alone with minimal support. Recommendation for SNF on discharge. -Delirium precautions -Continue home Effexor  Dyslipidemia- (present on admission) Patient is on Pravastatin as an outpatient. Held secondary to NPO status currently. -Resume Pravastatin once oral intake is more consistent  HTN (hypertension)- (present on admission) Patient is on verapamil, lisinopril, hydrochlorothiazide and doxazosin which are held secondary to NPO status. Continues to be hypertensive. -Resume home lisinopril -Continue Hydralazine PRN  Insomnia -Continue home trazodone and melatonin    DVT prophylaxis: Lovenox Code Status:   Code Status: Full Code Family Communication: None at bedside Disposition Plan: Discharge home likely in 3-5 days pending improvement of tachypnea, general surgery/IR recommendations   Consultants:  General surgery Interventional radiology  Procedures:  Percutaneous cholecystostomy (2/3)  Antimicrobials: Zosyn    Subjective: Patient reports no issues this morning.  Objective: Vitals:   09/19/21 0552 09/19/21 0749 09/19/21 1336 09/19/21 1347  BP: (!) 149/69  (!) 158/64   Pulse: (!) 51  (!) 59   Resp: 18  15   Temp: 97.9 F (36.6 C)  97.8 F (36.6 C)   TempSrc: Oral  Oral   SpO2: 99% 100% 95% 95%  Weight:      Height:        Intake/Output Summary (Last 24 hours) at 09/19/2021 1435 Last data filed at 09/19/2021 1336 Gross per 24 hour  Intake 941.46 ml  Output 942 ml  Net -0.54 ml    Filed Weights   09/17/21 0537 09/18/21 0500 09/19/21  0500  Weight: 106.4 kg 106.3 kg 107.3 kg    Examination:  General exam: Appears calm and comfortable Respiratory system: Diminished but clear. Respiratory effort normal. Cardiovascular system: S1 & S2 heard, RRR. Gastrointestinal system: Abdomen is distended, soft and nontender. Normal bowel sounds heard. Central nervous system: Alert and oriented. No focal neurological deficits. Musculoskeletal: No edema. No calf tenderness Skin: No cyanosis. No rashes Psychiatry: Judgement and insight appear normal. Mood & affect appropriate.    Data Reviewed: I have personally reviewed following labs and imaging studies  CBC Lab Results  Component Value Date   WBC 11.0 (H) 09/19/2021   RBC 3.86 (L) 09/19/2021   HGB 11.6 (L) 09/19/2021   HCT 36.1 09/19/2021   MCV 93.5 09/19/2021   MCH 30.1 09/19/2021   PLT 172 09/19/2021   MCHC 32.1 09/19/2021   RDW 12.5 09/19/2021   LYMPHSABS 0.8 09/18/2021   MONOABS 1.2 (H) 09/18/2021   EOSABS 0.1 09/18/2021   BASOSABS 0.0 92/42/6834     Last metabolic panel Lab Results  Component Value Date   NA 137 09/19/2021   K 3.2 (L) 09/19/2021   CL 102 09/19/2021   CO2 26 09/19/2021   BUN 23 09/19/2021   CREATININE 0.96 09/19/2021   GLUCOSE 101 (H) 09/19/2021   GFRNONAA 60 (L) 09/19/2021   GFRAA 45 (L) 11/13/2020   CALCIUM 8.9 09/19/2021   PHOS 3.6 08/01/2018   PROT 6.2 (L) 09/19/2021   ALBUMIN 2.9 (L) 09/19/2021   LABGLOB 3.2 10/12/2016   BILITOT 0.6 09/19/2021   ALKPHOS 79 09/19/2021   AST 33 09/19/2021   ALT 33 09/19/2021   ANIONGAP 9 09/19/2021    CBG (last 3)  No results for input(s): GLUCAP in the last 72 hours.   GFR: Estimated Creatinine Clearance: 56.9 mL/min (by C-G formula based on SCr of 0.96 mg/dL).  Coagulation Profile: Recent Labs  Lab 09/17/21 0940  INR 1.4*     Recent Results (from the past 240 hour(s))  Resp Panel by RT-PCR (Flu A&B, Covid) Nasopharyngeal Swab     Status: None   Collection Time: 09/16/21  3:29  PM   Specimen: Nasopharyngeal Swab; Nasopharyngeal(NP) swabs in vial transport medium  Result Value Ref Range Status   SARS Coronavirus 2 by RT PCR NEGATIVE NEGATIVE Final    Comment: (NOTE) SARS-CoV-2 target nucleic acids are NOT DETECTED.  The SARS-CoV-2 RNA is generally detectable in upper respiratory specimens during the acute phase of infection. The lowest concentration of SARS-CoV-2 viral copies this assay can detect is 138 copies/mL. A negative result does not preclude SARS-Cov-2 infection and should not be used as the sole basis for treatment or other patient management decisions. A negative result may occur with  improper specimen collection/handling, submission of specimen other than nasopharyngeal swab, presence of viral mutation(s) within the areas targeted by this assay, and inadequate number of viral copies(<138 copies/mL). A negative result must be combined with clinical observations, patient history, and epidemiological information. The expected result is Negative.  Fact Sheet for Patients:  EntrepreneurPulse.com.au  Fact Sheet for Healthcare Providers:  IncredibleEmployment.be  This test is no t yet approved or cleared by the [place] FDA and  has been authorized for detection and/or diagnosis of SARS-CoV-2 by FDA under an Emergency Use Authorization (EUA). This EUA will remain  in effect (meaning this test can be used) for the duration of the COVID-19 declaration under Section 564(b)(1) of the Act, 21 U.S.C.section 360bbb-3(b)(1), unless the authorization is terminated  or revoked  sooner.       Influenza A by PCR NEGATIVE NEGATIVE Final   Influenza B by PCR NEGATIVE NEGATIVE Final    Comment: (NOTE) The Xpert Xpress SARS-CoV-2/FLU/RSV plus assay is intended as an aid in the diagnosis of influenza from Nasopharyngeal swab specimens and should not be used as a sole basis for treatment. Nasal washings and aspirates are  unacceptable for Xpert Xpress SARS-CoV-2/FLU/RSV testing.  Fact Sheet for Patients: EntrepreneurPulse.com.au  Fact Sheet for Healthcare Providers: IncredibleEmployment.be  This test is not yet approved or cleared by the [place] FDA and has been authorized for detection and/or diagnosis of SARS-CoV-2 by FDA under an Emergency Use Authorization (EUA). This EUA will remain in effect (meaning this test can be used) for the duration of the COVID-19 declaration under Section 564(b)(1) of the Act, 21 U.S.C. section 360bbb-3(b)(1), unless the authorization is terminated or revoked.  Performed at [facility], [place] [address], [place], [place] [address]   Culture, blood (routine x 2)     Status: None (Preliminary result)   Collection Time: 09/16/21  6:20 PM   Specimen: BLOOD RIGHT FOREARM  Result Value Ref Range Status   Specimen Description   Final    BLOOD RIGHT FOREARM BLOOD Performed at [facility] [address], [place], [place] [address]    Special Requests   Final    BOTTLES DRAWN AEROBIC AND ANAEROBIC Blood Culture adequate volume Performed at [facility] [address], [place], [place] [address]    Culture   Final    NO GROWTH 3 DAYS Performed at [facility], [place] [address], [place], [place] [address]    Report Status PENDING  Incomplete  Culture, blood (routine x 2)     Status: None (Preliminary result)   Collection Time: 09/16/21  6:24 PM   Specimen: BLOOD  Result Value Ref Range Status   Specimen Description   Final    BLOOD RIGHT ANTECUBITAL Performed at [facility] [address], [place], [place] [address]    Special Requests   Final    BOTTLES DRAWN AEROBIC AND ANAEROBIC Blood Culture adequate volume Performed at [facility] [address] [place], [place] [address]    Culture   Final    NO GROWTH 3  DAYS Performed at [facility], [place] [address] [place], [place] [address]    Report Status PENDING  Incomplete  Body fluid culture w Gram Stain     Status: None (Preliminary result)   Collection Time: 09/18/21  8:26 AM   Specimen: BILE; Body Fluid  Result Value Ref Range Status   Specimen Description   Final    BILE Performed at [facility] [address] [place], [place] [address]    Special Requests   Final    Normal Performed at [facility], [place] [address] [place], [place] [address]    Gram Stain   Final    MODERATE WBC PRESENT, PREDOMINANTLY PMN RARE GRAM VARIABLE ROD    Culture   Final    CULTURE REINCUBATED FOR BETTER GROWTH Performed at [facility], [place] [address] [place], [place] [address]    Report Status PENDING  Incomplete         Radiology Studies: No results found.      Scheduled Meds:  acetaminophen  1,000 mg Oral TID   dorzolamide  1 drop Left Eye BID  enoxaparin (LOVENOX) injection  40 mg Subcutaneous Q24H   feeding supplement  237 mL Oral BID BM   guaiFENesin  1,200 mg Oral BID   ipratropium-albuterol  3 mL Nebulization Q6H   latanoprost  1 drop Left Eye QHS   lip balm  1 application Topical BID   lisinopril  40 mg Oral Daily   melatonin  5 mg Oral QHS   methylPREDNISolone (SOLU-MEDROL) injection  40 mg Intravenous Daily   polycarbophil  625 mg Oral BID   sodium chloride flush  3 mL Intravenous Q12H   sodium chloride flush  5 mL Intracatheter Q8H   traZODone  50 mg Oral QHS   venlafaxine XR  75 mg Oral Q breakfast   Continuous Infusions:  sodium chloride     methocarbamol (ROBAXIN) IV     piperacillin-tazobactam (ZOSYN)  IV 3.375 g (09/19/21 1424)     LOS: 2 days     Cordelia Poche, MD [facility] 09/19/2021, 2:35 PM  If 7PM-7AM, please contact night-coverage [email or web address]

## 2021-09-20 LAB — CBC
HCT: 37.1 % (ref 36.0–46.0)
Hemoglobin: 12.2 g/dL (ref 12.0–15.0)
MCH: 30.3 pg (ref 26.0–34.0)
MCHC: 32.9 g/dL (ref 30.0–36.0)
MCV: 92.1 fL (ref 80.0–100.0)
Platelets: 213 10*3/uL (ref 150–400)
RBC: 4.03 MIL/uL (ref 3.87–5.11)
RDW: 12.3 % (ref 11.5–15.5)
WBC: 10.7 10*3/uL — ABNORMAL HIGH (ref 4.0–10.5)
nRBC: 0 % (ref 0.0–0.2)

## 2021-09-20 LAB — POTASSIUM: Potassium: 3.4 mmol/L — ABNORMAL LOW (ref 3.5–5.1)

## 2021-09-20 LAB — MAGNESIUM: Magnesium: 1.9 mg/dL (ref 1.7–2.4)

## 2021-09-20 MED ORDER — IPRATROPIUM-ALBUTEROL 0.5-2.5 (3) MG/3ML IN SOLN
3.0000 mL | Freq: Three times a day (TID) | RESPIRATORY_TRACT | Status: DC
  Administered 2021-09-20 – 2021-09-21 (×3): 3 mL via RESPIRATORY_TRACT
  Filled 2021-09-20 (×3): qty 3

## 2021-09-20 MED ORDER — POTASSIUM CHLORIDE CRYS ER 20 MEQ PO TBCR
40.0000 meq | EXTENDED_RELEASE_TABLET | ORAL | Status: AC
  Administered 2021-09-20 (×2): 40 meq via ORAL
  Filled 2021-09-20 (×2): qty 2

## 2021-09-20 MED ORDER — HYDROCHLOROTHIAZIDE 25 MG PO TABS
25.0000 mg | ORAL_TABLET | Freq: Every day | ORAL | Status: DC
  Administered 2021-09-20 – 2021-09-21 (×2): 25 mg via ORAL
  Filled 2021-09-20 (×2): qty 1

## 2021-09-20 MED ORDER — DOXAZOSIN MESYLATE 2 MG PO TABS
2.0000 mg | ORAL_TABLET | Freq: Every day | ORAL | Status: DC
  Administered 2021-09-20 – 2021-09-21 (×2): 2 mg via ORAL
  Filled 2021-09-20 (×2): qty 1

## 2021-09-20 NOTE — Care Management Important Message (Signed)
Important Message  Patient Details IM Letter placed in Patients room. Name: Virginia Walters MRN: [id number] Date of Birth: 1940/11/29   Medicare Important Message Given:  Yes     Kerin Salen 09/20/2021, 4:07 PM

## 2021-09-20 NOTE — Progress Notes (Signed)
Occupational Therapy Treatment Patient Details Name: Virginia Walters MRN: [id number] DOB: [birth year] Today's Date: 09/20/2021   History of present illness Patient is an 81 year old female admitted with Acute Cholecystitis.   IR consulted - percutaneous cholecystotomy, done 2/3  PMH includes COPD, chronic systolic heart failure   OT comments  Patient was educated on donning socks with sock aid sitting EOB with mod A to don socks bilaterally. Patient was min A for standing at sink to brush teeth with noted fatigue after task. Patient was able to maintain O2 saturation above 90% during activities on 2L/min. Patient would continue to benefit from skilled OT services at this time while admitted and after d/c to address noted deficits in order to improve overall safety and independence in ADLs.     Recommendations for follow up therapy are one component of a multi-disciplinary discharge planning process, led by the attending physician.  Recommendations may be updated based on patient status, additional functional criteria and insurance authorization.    Follow Up Recommendations  Skilled nursing-short term rehab (<3 hours/day)    Assistance Recommended at Discharge Frequent or constant Supervision/Assistance  Patient can return home with the following  Two people to help with walking and/or transfers;A lot of help with bathing/dressing/bathroom;Assistance with cooking/housework;Help with stairs or ramp for entrance   Equipment Recommendations  BSC/3in1    Recommendations for Other Services      Precautions / Restrictions Precautions Precaution Comments: choleycystotomy-drain, O2 Restrictions Weight Bearing Restrictions: No       Mobility Bed Mobility Overal bed mobility: Needs Assistance Bed Mobility: Rolling, Sidelying to Sit   Sidelying to sit: HOB elevated, Mod assist            Transfers                         Balance Overall balance assessment: Needs  assistance Sitting-balance support: Single extremity supported, No upper extremity supported Sitting balance-Leahy Scale: Good     Standing balance support: During functional activity, Bilateral upper extremity supported, Single extremity supported Standing balance-Leahy Scale: Fair                             ADL either performed or assessed with clinical judgement   ADL Overall ADL's : Needs assistance/impaired     Grooming: Min guard;Oral care;Standing;Brushing hair Grooming Details (indicate cue type and reason): at sink with increased time. patient was able to maintain O2 above 90% on 2L/min with activity. patient noted to be fatigued after standing tasks.   Upper Body Bathing Details (indicate cue type and reason): patient indicated having washed up already this AM         Lower Body Dressing: Moderate assistance;Sitting/lateral leans;With adaptive equipment Lower Body Dressing Details (indicate cue type and reason): patient was educated on using sock aid to don socks with increased assistance and cues needed to complete task. patient was uanble to carryover education from one side to the other. patient was noted to attempt to put sock on bottom of sock aid x2               General ADL Comments: patient was able to transfer in room with min a and RW from edge of bed to sink then to recilner in room. patient declined needing to use bathroom at this time.    Extremity/Trunk Assessment  Vision       Perception     Praxis      Cognition Arousal/Alertness: Awake/alert Behavior During Therapy: WFL for tasks assessed/performed Overall Cognitive Status: Within Functional Limits for tasks assessed                                 General Comments: patient has poor insight to deficits at this time.        Exercises      Shoulder Instructions       General Comments      Pertinent Vitals/ Pain       Pain  Assessment Pain Assessment: No/denies pain  Home Living                                          Prior Functioning/Environment              Frequency  Min 2X/week        Progress Toward Goals  OT Goals(current goals can now be found in the care plan section)  Progress towards OT goals: Progressing toward goals     Plan Discharge plan remains appropriate    Co-evaluation                 AM-PAC OT "6 Clicks" Daily Activity     Outcome Measure   Help from another person eating meals?: Total Help from another person taking care of personal grooming?: A Little Help from another person toileting, which includes using toliet, bedpan, or urinal?: A Lot Help from another person bathing (including washing, rinsing, drying)?: A Lot Help from another person to put on and taking off regular upper body clothing?: A Little Help from another person to put on and taking off regular lower body clothing?: Total 6 Click Score: 12    End of Session Equipment Utilized During Treatment: Rolling walker (2 wheels);Gait belt;Oxygen  OT Visit Diagnosis: Unsteadiness on feet (R26.81);Other abnormalities of gait and mobility (R26.89);Muscle weakness (generalized) (M62.81);Other symptoms and signs involving cognitive function   Activity Tolerance Patient limited by fatigue   Patient Left with call bell/phone within reach;in chair;with chair alarm set   Nurse Communication Mobility status        Time: 4970-2637 OT Time Calculation (min): 29 min  Charges: OT General Charges $OT Visit: 1 Visit OT Treatments $Self Care/Home Management : 23-37 mins  Jackelyn Poling OTR/L, MS Acute Rehabilitation Department Office# [phone] Pager# [phone]   Marcellina Millin 09/20/2021, 12:48 PM

## 2021-09-20 NOTE — Progress Notes (Signed)
PT demonstrated hands on understanding of Flutter device- NPC at this time. 

## 2021-09-20 NOTE — NC FL2 (Addendum)
[place] LEVEL OF CARE SCREENING TOOL     IDENTIFICATION  Patient Name: Virginia Walters Birthdate: 02-02-1941 Sex: female Admission Date (Current Location): 09/16/2021  [facility] and [place] Number:  Herbalist and Address:  [facility],  [place], [place]      Provider Number: [id number]  Attending Physician Name and Address:  Mariel Aloe, MD  Relative Name and Phone Number:  daughter, Rosalyn Charters [phone]    Current Level of Care: Hospital Recommended Level of Care: [place] Prior Approval Number:    Date Approved/Denied:   PASRR Number: [id number]  Discharge Plan: SNF    Current Diagnoses: Patient Active Problem List   Diagnosis Date Noted   Acute on chronic respiratory failure with hypoxia ([place]) 09/21/2021   Insomnia 09/18/2021   Acute cholecystitis s/p perc cholecystotomy drain 09/17/2021 09/16/2021   Chronic kidney disease, stage 3a ([place]) 09/16/2021   Iron deficiency anemia    Gastric polyp    Stomach irritation    Ectopic gastric mucosa    Polyp of colon    Angiodysplasia of intestinal tract    COPD with chronic bronchitis ([place]) 02/10/2020   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, stage 3a ([place]) 02/10/2020   COPD exacerbation ([place]) 51/09/5850   Chronic systolic heart failure ([place]) 08/29/2018   Acute on chronic heart failure with preserved ejection fraction (HCC)    Symptomatic anemia 07/20/2018   Dementia with behavioral problem 10/26/2016   Alcohol abuse 10/26/2016   Memory loss 10/12/2016   Gait abnormality 10/12/2016   Orthostatic dizziness 10/12/2016   Paresthesia 10/12/2016   Abnormal EKG 12/28/2010   HTN (hypertension)    Dyslipidemia    PFO (patent foramen ovale)    Retinal artery occlusion    GERD (gastroesophageal reflux disease)     Orientation RESPIRATION BLADDER Height & Weight     Self, Time, Situation, Place  O2 Continent (purewick currently) Weight:  236 lb 8.9 oz (107.3 kg) Height:  5\' 5"  (165.1 cm)  BEHAVIORAL SYMPTOMS/MOOD NEUROLOGICAL BOWEL NUTRITION STATUS      Continent    AMBULATORY STATUS COMMUNICATION OF NEEDS Skin   Limited Assist Verbally Other (Comment) (percutaneous cholecystostomy drain)                       Personal Care Assistance Level of Assistance  Bathing, Dressing Bathing Assistance: Limited assistance   Dressing Assistance: Limited assistance     Functional Limitations Info             [place]  PT (By licensed PT), OT (By licensed OT)     PT Frequency: 5x/wk OT Frequency: 5x/wk            Contractures Contractures Info: Not present    Additional Factors Info  Code Status, Allergies, Psychotropic Code Status Info: Full Allergies Info: oxycodone Psychotropic Info: see MAR         Current Medications (09/21/2021):  This is the current hospital active medication list Current Facility-Administered Medications  Medication Dose Route Frequency Provider Last Rate Last Admin   0.9 %  sodium chloride infusion  250 mL Intravenous PRN Michael Boston, MD 10 mL/hr at 09/20/21 1345 250 mL at 09/20/21 1345   acetaminophen (TYLENOL) tablet 1,000 mg  1,000 mg Oral TID Michael Boston, MD   1,000 mg at 09/21/21 0911   alum & mag hydroxide-simeth (MAALOX/MYLANTA) 200-200-20 MG/5ML suspension 30 mL  30 mL Oral  Q6H PRN Michael Boston, MD       dorzolamide (TRUSOPT) 2 % ophthalmic solution 1 drop  1 drop Left Eye BID Mariel Aloe, MD   1 drop at 09/21/21 0912   doxazosin (CARDURA) tablet 2 mg  2 mg Oral Daily Mariel Aloe, MD   2 mg at 09/21/21 0912   enoxaparin (LOVENOX) injection 40 mg  40 mg Subcutaneous Q24H Mariel Aloe, MD   40 mg at 09/21/21 0911   feeding supplement (ENSURE SURGERY) liquid 237 mL  237 mL Oral BID BM Michael Boston, MD   237 mL at 09/19/21 1424   guaiFENesin (MUCINEX) 12 hr tablet 1,200 mg  1,200 mg Oral BID Mariel Aloe, MD   1,200 mg at 09/21/21 7322    hydrALAZINE (APRESOLINE) injection 10 mg  10 mg Intravenous Q6H PRN Mariel Aloe, MD       hydrochlorothiazide (HYDRODIURIL) tablet 25 mg  25 mg Oral Daily Mariel Aloe, MD   25 mg at 09/21/21 0911   iohexol (OMNIPAQUE) 300 MG/ML solution 50 mL  50 mL Other Once PRN Sandi Mariscal, MD       ipratropium-albuterol (DUONEB) 0.5-2.5 (3) MG/3ML nebulizer solution 3 mL  3 mL Nebulization Q4H PRN Mariel Aloe, MD   3 mL at 09/18/21 1222   ipratropium-albuterol (DUONEB) 0.5-2.5 (3) MG/3ML nebulizer solution 3 mL  3 mL Nebulization TID Mariel Aloe, MD   3 mL at 09/21/21 0723   latanoprost (XALATAN) 0.005 % ophthalmic solution 1 drop  1 drop Left Eye QHS Mariel Aloe, MD   1 drop at 09/20/21 2107   lip balm (CARMEX) ointment 1 application  1 application Topical BID Michael Boston, MD   1 application at 02/54/27 0914   lisinopril (ZESTRIL) tablet 40 mg  40 mg Oral Daily Mariel Aloe, MD   40 mg at 09/21/21 0623   magic mouthwash  15 mL Oral QID PRN Michael Boston, MD       melatonin tablet 5 mg  5 mg Oral QHS Mariel Aloe, MD   5 mg at 09/20/21 2105   menthol-cetylpyridinium (CEPACOL) lozenge 3 mg  1 lozenge Oral PRN Michael Boston, MD       methocarbamol (ROBAXIN) 1,000 mg in dextrose 5 % 100 mL IVPB  1,000 mg Intravenous Q6H PRN Michael Boston, MD       methocarbamol (ROBAXIN) tablet 1,000 mg  1,000 mg Oral Q6H PRN Michael Boston, MD       ondansetron [facility]) tablet 4 mg  4 mg Oral Q6H PRN Mariel Aloe, MD       Or   ondansetron (ZOFRAN) injection 4 mg  4 mg Intravenous Q6H PRN Mariel Aloe, MD       phenol (CHLORASEPTIC) mouth spray 2 spray  2 spray Mouth/Throat Q2H PRN Michael Boston, MD       piperacillin-tazobactam (ZOSYN) IVPB 3.375 g  3.375 g Intravenous Q8H Mariel Aloe, MD 12.5 mL/hr at 09/21/21 0605 3.375 g at 09/21/21 0605   polycarbophil (FIBERCON) tablet 625 mg  625 mg Oral BID Michael Boston, MD   625 mg at 09/21/21 0912   polyethylene glycol (MIRALAX / GLYCOLAX)  packet 17 g  17 g Oral BID Mariel Aloe, MD   17 g at 09/21/21 0914   prochlorperazine (COMPAZINE) injection 5-10 mg  5-10 mg Intravenous Q4H PRN Michael Boston, MD       simethicone (MYLICON) 40 JS/2.8BT suspension 80  mg  80 mg Oral QID PRN Michael Boston, MD       sodium chloride flush (NS) 0.9 % injection 3 mL  3 mL Intravenous Gorden Harms, MD   3 mL at 09/20/21 2107   sodium chloride flush (NS) 0.9 % injection 3 mL  3 mL Intravenous PRN Michael Boston, MD       sodium chloride flush (NS) 0.9 % injection 5 mL  5 mL Intracatheter Q8H Sandi Mariscal, MD   5 mL at 09/20/21 1550   traZODone (DESYREL) tablet 50 mg  50 mg Oral QHS Mariel Aloe, MD   50 mg at 09/20/21 2106   venlafaxine XR (EFFEXOR-XR) 24 hr capsule 75 mg  75 mg Oral Q breakfast Mariel Aloe, MD   75 mg at 09/21/21 0911     Discharge Medications: Please see discharge summary for a list of discharge medications.  Relevant Imaging Results:  Relevant Lab Results:   Additional Information SS# [id number]  Lennart Pall, LCSW

## 2021-09-20 NOTE — Progress Notes (Signed)
Transition of Care (TOC) -30 day Note       Patient Details  Name: Virginia Walters MRN:  [id number] Date of Birth:  Nov 26, 1940   Transition of Care [facility]) CM/SW Contact  Name: Lennart Pall, [place]  Phone Number:  [phone] Date:  09/20/21 Time:  6122   MUST ID: [id number]   To Whom it May Concern:   Please be advised that the above patient will require a short-term nursing home stay, anticipated 30 days or less rehabilitation and strengthening. The plan is for return home.

## 2021-09-20 NOTE — Progress Notes (Signed)
Physical Therapy Treatment Patient Details Name: Virginia Walters MRN: [id number] DOB: Jan 03, 1941 Today's Date: 09/20/2021   History of Present Illness Patient is an 81 year old female admitted with Acute Cholecystitis.   IR consulted - percutaneous cholecystotomy, done 2/3  PMH includes COPD, chronic systolic heart failure    PT Comments    Nursing finishing replacing IV tubing on arrival (pt had pulled out IV on the way to bathroom earlier).  Pt assisted with ambulating in hallway and SPO2 89% on room air at lowest.  Pt assisted back to bed per request.      Recommendations for follow up therapy are one component of a multi-disciplinary discharge planning process, led by the attending physician.  Recommendations may be updated based on patient status, additional functional criteria and insurance authorization.  Follow Up Recommendations  Skilled nursing-short term rehab (<3 hours/day)     Assistance Recommended at Discharge Intermittent Supervision/Assistance  Patient can return home with the following A little help with walking and/or transfers;Help with stairs or ramp for entrance;A little help with bathing/dressing/bathroom;Assistance with cooking/housework;Assist for transportation   Equipment Recommendations  None recommended by PT    Recommendations for Other Services       Precautions / Restrictions Precautions Precautions: Other (comment) Precaution Comments: choleycystotomy-drain, monitor O2 Restrictions Weight Bearing Restrictions: No     Mobility  Bed Mobility Overal bed mobility: Needs Assistance Bed Mobility: Rolling, Sidelying to Sit, Sit to Supine Rolling: Min guard Sidelying to sit: Min assist   Sit to supine: Min assist   General bed mobility comments: assist for trunk upright and LEs onto bed    Transfers Overall transfer level: Needs assistance Equipment used: Rolling walker (2 wheels) Transfers: Sit to/from Stand Sit to Stand: Min guard            General transfer comment: cues for hand placement, increased time and effort    Ambulation/Gait Ambulation/Gait assistance: Min guard Gait Distance (Feet): 80 Feet Assistive device: Rolling walker (2 wheels) Gait Pattern/deviations: Step-through pattern, Decreased stride length, Trunk flexed       General Gait Details: cues for posture and RW positioning; SPO2 lowest 89% on room air,  mostly 93% room air (RN notified and requested pt on 2L upon return to bed for recovery)   Stairs             Wheelchair Mobility    Modified Rankin (Stroke Patients Only)       Balance           Standing balance support: No upper extremity supported Standing balance-Leahy Scale: Fair Standing balance comment: static fair                            Cognition Arousal/Alertness: Awake/alert Behavior During Therapy: WFL for tasks assessed/performed Overall Cognitive Status: Within Functional Limits for tasks assessed                                          Exercises      General Comments        Pertinent Vitals/Pain Pain Assessment Pain Assessment: No/denies pain Pain Intervention(s): Monitored during session, Repositioned    Home Living                          Prior Function  PT Goals (current goals can now be found in the care plan section) Progress towards PT goals: Progressing toward goals    Frequency    Min 2X/week      PT Plan Current plan remains appropriate    Co-evaluation              AM-PAC PT "6 Clicks" Mobility   Outcome Measure  Help needed turning from your back to your side while in a flat bed without using bedrails?: A Lot Help needed moving from lying on your back to sitting on the side of a flat bed without using bedrails?: A Lot Help needed moving to and from a bed to a chair (including a wheelchair)?: A Little Help needed standing up from a chair using your arms  (e.g., wheelchair or bedside chair)?: A Little Help needed to walk in hospital room?: A Little Help needed climbing 3-5 steps with a railing? : A Lot 6 Click Score: 15    End of Session Equipment Utilized During Treatment: Gait belt Activity Tolerance: Patient tolerated treatment well Patient left: in bed;with call bell/phone within reach;with bed alarm set Nurse Communication: Mobility status PT Visit Diagnosis: Other abnormalities of gait and mobility (R26.89);Difficulty in walking, not elsewhere classified (R26.2)     Time: 1350-1415 PT Time Calculation (min) (ACUTE ONLY): 25 min  Charges:  $Gait Training: 8-22 mins                     Arlyce Dice, DPT Acute Rehabilitation Services Pager: [phone] Office: [place] 09/20/2021, 3:00 PM

## 2021-09-20 NOTE — Progress Notes (Signed)
Progress Note     Subjective: Pt reports abdominal pain is significantly improved since drain placement. She denies n/v. She is having bowel function. She plans to go home from the hospital.   Objective: Vital signs in last 24 hours: Temp:  [97.5 F (36.4 C)-99 F (37.2 C)] 98.4 F (36.9 C) (02/06 0735) Pulse Rate:  [52-69] 54 (02/06 0735) Resp:  [15-20] 20 (02/06 0735) BP: (158-177)/(64-114) 174/72 (02/06 0839) SpO2:  [90 %-98 %] 95 % (02/06 0742) FiO2 (%):  [28 %] 28 % (02/05 1347) Last BM Date: 09/19/21  Intake/Output from previous day: 02/05 0701 - 02/06 0700 In: 780.8 [P.O.:720; I.V.:5; IV Piggyback:50.8] Out: 400 [Urine:300; Drains:100] Intake/Output this shift: Total I/O In: 100 [P.O.:100] Out: -   PE: General: pleasant, WD, obese female who is sitting up in NAD HEENT:  Sclera anicteric Heart: regular, rate, and rhythm.   Lungs: Respiratory effort nonlabored on supplemental O2 Abd: soft, NT, mildly distended, drain with SS fluid Psych: A&Ox3 with an appropriate affect.    Lab Results:  Recent Labs    09/19/21 0501 09/20/21 0831  WBC 11.0* 10.7*  HGB 11.6* 12.2  HCT 36.1 37.1  PLT 172 213   BMET Recent Labs    09/19/21 0501 09/20/21 0831  NA 137  --   K 3.2* 3.4*  CL 102  --   CO2 26  --   GLUCOSE 101*  --   BUN 23  --   CREATININE 0.96  --   CALCIUM 8.9  --    PT/INR No results for input(s): LABPROT, INR in the last 72 hours. CMP     Component Value Date/Time   NA 137 09/19/2021 0501   K 3.4 (L) 09/20/2021 0831   K 4.2 07/22/2013 1325   CL 102 09/19/2021 0501   CO2 26 09/19/2021 0501   GLUCOSE 101 (H) 09/19/2021 0501   BUN 23 09/19/2021 0501   BUN 26 (A) 08/24/2009 0000   CREATININE 0.96 09/19/2021 0501   CREATININE 1.07 (H) 07/27/2021 1440   CALCIUM 8.9 09/19/2021 0501   PROT 6.2 (L) 09/19/2021 0501   PROT 6.7 10/12/2016 1307   ALBUMIN 2.9 (L) 09/19/2021 0501   AST 33 09/19/2021 0501   ALT 33 09/19/2021 0501   ALKPHOS 79  09/19/2021 0501   BILITOT 0.6 09/19/2021 0501   GFRNONAA 60 (L) 09/19/2021 0501   GFRNONAA 39 (L) 11/13/2020 1438   GFRAA 45 (L) 11/13/2020 1438   Lipase     Component Value Date/Time   LIPASE 28 09/16/2021 1354       Studies/Results: No results found.  Anti-infectives: Anti-infectives (From admission, onward)    Start     Dose/Rate Route Frequency Ordered Stop   09/16/21 2300  piperacillin-tazobactam (ZOSYN) IVPB 3.375 g        3.375 g 12.5 mL/hr over 240 Minutes Intravenous Every 8 hours 09/16/21 1634     09/16/21 1645  piperacillin-tazobactam (ZOSYN) IVPB 3.375 g        3.375 g 100 mL/hr over 30 Minutes Intravenous STAT 09/16/21 1634 09/16/21 1746        Assessment/Plan Acute Cholecystitis  - s/p IR perc cholecystostomy on 2/3  - WBC down to 10.7, afebrile, cxs from drain still pending - continue abx, could transition to PO augmentin on discharge recommend abx course totaling 14 days - will plan outpatient follow up to discuss interval cholecystectomy in 6-8 weeks - in AVS already - stable for discharge from a surgical standpoint  when cleared from a medical standpoint, general surgery will sign off at this time but please call if we can be of further assistance    FEN - HH diet  VTE - SCDs, LMWH ID - Zosyn   - below per TRH -  COPD with exacerbation  PFO HTN  HLD  LOS: 3 days   I reviewed hospitalist notes, last 24 h vitals and pain scores, last 48 h intake and output, and last 24 h labs and trends.  This care required low level of medical decision making.    Norm Parcel, [facility] Surgery 09/20/2021, 9:57 AM Please see Amion for pager number during day hours 7:00am-4:30pm

## 2021-09-20 NOTE — Progress Notes (Signed)
PROGRESS NOTE    Virginia Walters  IHK:[id number] DOB: 30-Jun-1941 DOA: 09/16/2021 PCP: Susy Frizzle, MD   Brief Narrative: Virginia Walters is a 81 y.o. female with a history of COPD, chronic heart failure, memory loss vs dementia. Patient presented from her PCP's office secondary to abdominal pain with evidence of acute cholecystitis on CT imaging. General surgery consulted who recommended IR consult for percutaneous cholecystostomy which was placed on 2/3. Patient with associated tachypnea and possible COPD exacerbation which has improved.   Assessment and Plan: * Acute cholecystitis s/p perc cholecystotomy drain 09/17/2021 Diagnosed on CT abdomen/pelvis. No evidence of choledocholithiasis on imaging. ALP, AST, ALT wnl. Associated leukocytosis. General surgery consulted and recommended IR consult for percutaneous cholecystostomy, which was completed on 2/3. Leukocytosis improving. Blood cultures with no growth to date. IR recommendations (2/4) reviewed. -General surgery recommendations: Can transition to Augmentin when ready, outpatient follow-up for interval cholecystectomy in 6-8 weeks -IR recommendations: pending -Continue Zosyn and switch to Augmentin tomorrow  Tachypnea Possibly related to COPD exacerbation as patient developed worsening wheezing. Per patient, tachypnea was worse than baseline. No associated tachycardia or fluid overload. Repeat chest x-ray looked clear. Tachypnea has improved. -See problem, COPD exacerbation   COPD exacerbation ([place]) Patient with worsening wheezing and tachypnea. No cough or sputum production. Wheezing seems more upper airway than lower. Completed 3 days of Solu-medrol. PT/OT recommending SNF -Scheduled Duonebs -Mobilization -Flutter valve  COPD with chronic bronchitis ([place])- (present on admission) Patient has previously required oxygen which has been discontinued. Patient was prescribed Symbicort as an outpatient but is currently not  using. -See problem, COPD exacerbation  Chronic systolic heart failure ([place])- (present on admission) Euvolemic. Patient's last LVEF mildly reduced at 45-50% in 2019. On lisinopril. Not on diuretic therapy. Noted that patient is on verapamil for blood pressure control. Weight on admission of 238.1 lbs (108 kg). BNP slightly elevated. Weight of 236.6 lbs on 2/5 which is slightly up. No weight available today -Daily weights, strict in out  Chronic kidney disease, stage 3a (HCC) Creatinine stable and at baseline.  Dementia with behavioral problem- (present on admission) Noted on history. Patient lives alone with minimal support. Recommendation for SNF on discharge. -Delirium precautions -Continue home Effexor  Dyslipidemia- (present on admission) Patient is on Pravastatin as an outpatient. Held secondary to NPO status currently. -Resume Pravastatin once oral intake is more consistent  HTN (hypertension)- (present on admission) Patient is on verapamil, lisinopril, hydrochlorothiazide and doxazosin which are held secondary to NPO status. Continues to be hypertensive. -Continue home lisinopril and restart home hydrochlorothiazide and doxazosin -Continue Hydralazine PRN  Insomnia -Continue home trazodone and melatonin    DVT prophylaxis: Lovenox Code Status:   Code Status: Full Code Family Communication: None at bedside Disposition Plan: Discharge home likely in 1 day pending IR recommendations, transition to oral antibiotics   Consultants:  General surgery Interventional radiology  Procedures:  Percutaneous cholecystostomy (2/3)  Antimicrobials: Zosyn    Subjective: Patient reports no issues this morning.  Objective: Vitals:   09/20/21 0742 09/20/21 0839 09/20/21 1100 09/20/21 1246  BP:  (!) 174/72  (!) 160/72  Pulse:    62  Resp:    18  Temp:    98.5 F (36.9 C)  TempSrc:    Oral  SpO2: 95%  98% 98%  Weight:      Height:        Intake/Output Summary (Last 24  hours) at 09/20/2021 1351 Last data filed at 09/20/2021 1000 Gross  per 24 hour  Intake 400.84 ml  Output 140 ml  Net 260.84 ml    Filed Weights   09/17/21 0537 09/18/21 0500 09/19/21 0500  Weight: 106.4 kg 106.3 kg 107.3 kg    Examination:  General exam: Appears calm and comfortable Respiratory system: Clear to auscultation. Respiratory effort normal. Cardiovascular system: S1 & S2 heard, RRR. No murmurs, rubs, gallops or clicks. Gastrointestinal system: Abdomen is distended, soft and nontender. Normal bowel sounds heard. Central nervous system: Slightly somnolent but arouses easily and oriented. No focal neurological deficits. Musculoskeletal: No calf tenderness Skin: No cyanosis. No rashes    Data Reviewed: I have personally reviewed following labs and imaging studies  CBC Lab Results  Component Value Date   WBC 10.7 (H) 09/20/2021   RBC 4.03 09/20/2021   HGB 12.2 09/20/2021   HCT 37.1 09/20/2021   MCV 92.1 09/20/2021   MCH 30.3 09/20/2021   PLT 213 09/20/2021   MCHC 32.9 09/20/2021   RDW 12.3 09/20/2021   LYMPHSABS 0.8 09/18/2021   MONOABS 1.2 (H) 09/18/2021   EOSABS 0.1 09/18/2021   BASOSABS 0.0 51/88/4166     Last metabolic panel Lab Results  Component Value Date   NA 137 09/19/2021   K 3.4 (L) 09/20/2021   CL 102 09/19/2021   CO2 26 09/19/2021   BUN 23 09/19/2021   CREATININE 0.96 09/19/2021   GLUCOSE 101 (H) 09/19/2021   GFRNONAA 60 (L) 09/19/2021   GFRAA 45 (L) 11/13/2020   CALCIUM 8.9 09/19/2021   PHOS 3.6 08/01/2018   PROT 6.2 (L) 09/19/2021   ALBUMIN 2.9 (L) 09/19/2021   LABGLOB 3.2 10/12/2016   BILITOT 0.6 09/19/2021   ALKPHOS 79 09/19/2021   AST 33 09/19/2021   ALT 33 09/19/2021   ANIONGAP 9 09/19/2021    CBG (last 3)  No results for input(s): GLUCAP in the last 72 hours.   GFR: Estimated Creatinine Clearance: 56.9 mL/min (by C-G formula based on SCr of 0.96 mg/dL).  Coagulation Profile: Recent Labs  Lab 09/17/21 0940  INR 1.4*      Recent Results (from the past 240 hour(s))  Resp Panel by RT-PCR (Flu A&B, Covid) Nasopharyngeal Swab     Status: None   Collection Time: 09/16/21  3:29 PM   Specimen: Nasopharyngeal Swab; Nasopharyngeal(NP) swabs in vial transport medium  Result Value Ref Range Status   SARS Coronavirus 2 by RT PCR NEGATIVE NEGATIVE Final    Comment: (NOTE) SARS-CoV-2 target nucleic acids are NOT DETECTED.  The SARS-CoV-2 RNA is generally detectable in upper respiratory specimens during the acute phase of infection. The lowest concentration of SARS-CoV-2 viral copies this assay can detect is 138 copies/mL. A negative result does not preclude SARS-Cov-2 infection and should not be used as the sole basis for treatment or other patient management decisions. A negative result may occur with  improper specimen collection/handling, submission of specimen other than nasopharyngeal swab, presence of viral mutation(s) within the areas targeted by this assay, and inadequate number of viral copies(<138 copies/mL). A negative result must be combined with clinical observations, patient history, and epidemiological information. The expected result is Negative.  Fact Sheet for Patients:  EntrepreneurPulse.com.au  Fact Sheet for Healthcare Providers:  IncredibleEmployment.be  This test is no t yet approved or cleared by the [place] FDA and  has been authorized for detection and/or diagnosis of SARS-CoV-2 by FDA under an Emergency Use Authorization (EUA). This EUA will remain  in effect (meaning this test can be used) for  the duration of the COVID-19 declaration under Section 564(b)(1) of the Act, 21 U.S.C.section 360bbb-3(b)(1), unless the authorization is terminated  or revoked sooner.       Influenza A by PCR NEGATIVE NEGATIVE Final   Influenza B by PCR NEGATIVE NEGATIVE Final    Comment: (NOTE) The Xpert Xpress SARS-CoV-2/FLU/RSV plus assay is intended as  an aid in the diagnosis of influenza from Nasopharyngeal swab specimens and should not be used as a sole basis for treatment. Nasal washings and aspirates are unacceptable for Xpert Xpress SARS-CoV-2/FLU/RSV testing.  Fact Sheet for Patients: EntrepreneurPulse.com.au  Fact Sheet for Healthcare Providers: IncredibleEmployment.be  This test is not yet approved or cleared by the [place] FDA and has been authorized for detection and/or diagnosis of SARS-CoV-2 by FDA under an Emergency Use Authorization (EUA). This EUA will remain in effect (meaning this test can be used) for the duration of the COVID-19 declaration under Section 564(b)(1) of the Act, 21 U.S.C. section 360bbb-3(b)(1), unless the authorization is terminated or revoked.  Performed at [facility], [place] [address] [place], [place] [address]   Culture, blood (routine x 2)     Status: None (Preliminary result)   Collection Time: 09/16/21  6:20 PM   Specimen: BLOOD RIGHT FOREARM  Result Value Ref Range Status   Specimen Description   Final    BLOOD RIGHT FOREARM BLOOD Performed at [place] [address], [place], [place] [address]    Special Requests   Final    BOTTLES DRAWN AEROBIC AND ANAEROBIC Blood Culture adequate volume Performed at [place] [address] [place], [place] [address]    Culture   Final    NO GROWTH 4 DAYS Performed at [facility], [place] [address] [place], [place] [address]    Report Status PENDING  Incomplete  Culture, blood (routine x 2)     Status: None (Preliminary result)   Collection Time: 09/16/21  6:24 PM   Specimen: BLOOD  Result Value Ref Range Status   Specimen Description   Final    BLOOD RIGHT ANTECUBITAL Performed at [place] [address] [place], [place] [address]    Special Requests   Final    BOTTLES DRAWN AEROBIC AND ANAEROBIC Blood  Culture adequate volume Performed at [place] [address], [place], [place] [address]    Culture   Final    NO GROWTH 4 DAYS Performed at [facility], [place] [address] [place], [place] [address]    Report Status PENDING  Incomplete  Body fluid culture w Gram Stain     Status: None (Preliminary result)   Collection Time: 09/18/21  8:26 AM   Specimen: BILE; Body Fluid  Result Value Ref Range Status   Specimen Description   Final    BILE Performed at [place] [address] [place], [place] [address]    Special Requests   Final    Normal Performed at [facility], [place] [address], [place], [place] [address]    Gram Stain   Final    MODERATE WBC PRESENT, PREDOMINANTLY PMN RARE GRAM VARIABLE ROD    Culture   Final    MODERATE KLEBSIELLA PNEUMONIAE SUSCEPTIBILITIES TO FOLLOW CULTURE REINCUBATED FOR BETTER GROWTH Performed at [facility], [place] [address] [place],  [address]    Report Status PENDING  Incomplete  Radiology Studies: No results found.      Scheduled Meds:  acetaminophen  1,000 mg Oral TID   dorzolamide  1 drop Left Eye BID   doxazosin  2 mg Oral Daily   enoxaparin (LOVENOX) injection  40 mg Subcutaneous Q24H   feeding supplement  237 mL Oral BID BM   guaiFENesin  1,200 mg Oral BID   hydrochlorothiazide  25 mg Oral Daily   ipratropium-albuterol  3 mL Nebulization Q6H   latanoprost  1 drop Left Eye QHS   lip balm  1 application Topical BID   lisinopril  40 mg Oral Daily   melatonin  5 mg Oral QHS   methylPREDNISolone (SOLU-MEDROL) injection  40 mg Intravenous Daily   polycarbophil  625 mg Oral BID   polyethylene glycol  17 g Oral BID   potassium chloride  40 mEq Oral Q4H   sodium chloride flush  3 mL Intravenous Q12H   sodium chloride flush  5 mL Intracatheter Q8H   traZODone  50 mg Oral QHS   venlafaxine XR  75 mg Oral Q breakfast   Continuous  Infusions:  sodium chloride 250 mL (09/20/21 1345)   methocarbamol (ROBAXIN) IV     piperacillin-tazobactam (ZOSYN)  IV 3.375 g (09/20/21 1350)     LOS: 3 days     Cordelia Poche, MD [facility] 09/20/2021, 1:51 PM  If 7PM-7AM, please contact night-coverage [email or web address]

## 2021-09-20 NOTE — TOC Initial Note (Addendum)
Transition of Care [facility]) - Initial/Assessment Note    Patient Details  Name: Virginia Walters MRN: [id number] Date of Birth: [birth year]  Transition of Care [facility]) CM/SW Contact:    Lennart Pall, LCSW Phone Number: 09/20/2021, 12:43 PM  Clinical Narrative:                 Met with pt today to introduce self/ TOC role and review therapy recommendations for SNF vs home with 24/7 assist.  Pt lying in bed with eyes closed but answers questions appropriately.  Pt confirms that she lives alone but quickly adds "the Smiths come by twice a day to help".  Pt confirms she has O2 at home but doesn't always wear this "but I will if I need to."  Explained to pt that therapies have recommended SNF for rehab and pt quickly declines this and feels that her neighbors Lawyer) can provide any help she needs.  Asked if she would be agreeable for me to speak with her daughter, Anderson Malta, and she is.   Reached out to daughter who is well aware that pt is declining the SNF option.  She states, "my mom is extremely stubborn especially when it comes to going home."  The daughter is, however, very agreeable with plan for SNF and notes that the neighbors are not willing to provide any type of medical assistance (I.e. emptying drain) and will NOT be there more than a couple of brief visits each day.  She asks that I revisit SNF plan with pt and review cost of privately hiring a caregiver.  Will plan to do this after therapy has seen her again today and have alerted PT to the concerns.  Continue to follow.  ADDENDUM:  Pt agreeable to plan for short term SNF.  Have informed daughter as well.  Bed search begun.  Expected Discharge Plan: [place] (vs. SNF) Barriers to Discharge: Continued Medical Work up   Patient Goals and CMS Choice Patient states their goals for this hospitalization and ongoing recovery are:: to return home      Expected Discharge Plan and Services Expected Discharge Plan: [place] (vs. SNF) In-house Referral: Clinical Social Work     Living arrangements for the past 2 months: [place]                                      Prior Living Arrangements/Services Living arrangements for the past 2 months: Single Family Home Lives with:: Self Patient language and need for interpreter reviewed:: Yes Do you feel safe going back to the place where you live?: Yes      Need for Family Participation in Patient Care: Yes (Comment) Care giver support system in place?: No (comment)   Criminal Activity/Legal Involvement Pertinent to Current Situation/Hospitalization: No - Comment as needed  Activities of Daily Living Home Assistive Devices/Equipment: Environmental consultant (specify type), Cane (specify quad or straight), Eyeglasses ADL Screening (condition at time of admission) Patient's cognitive ability adequate to safely complete daily activities?: Yes Is the patient deaf or have difficulty hearing?: No Does the patient have difficulty seeing, even when wearing glasses/contacts?: No Does the patient have difficulty concentrating, remembering, or making decisions?: No Patient able to express need for assistance with ADLs?: Yes Does the patient have difficulty dressing or bathing?: Yes Independently performs ADLs?: Yes (appropriate for developmental age) Does the patient have  difficulty walking or climbing stairs?: No Weakness of Legs: None Weakness of Arms/Hands: None  Permission Sought/Granted Permission sought to share information with : Family Supports Permission granted to share information with : Yes, Verbal Permission Granted  Share Information with NAME: Rosalyn Charters     Permission granted to share info w Relationship: daughter  Permission granted to share info w Contact Information: [phone]  Emotional Assessment Appearance:: Appears stated age Attitude/Demeanor/Rapport: Gracious, Lethargic Affect (typically observed):  Accepting Orientation: : Oriented to Self, Oriented to Place, Oriented to  Time, Oriented to Situation Alcohol / Substance Use: Not Applicable Psych Involvement: No (comment)  Admission diagnosis:  Acute cholecystitis [K81.0] Cholecystitis [K81.9] SOB (shortness of breath) [R06.02] Patient Active Problem List   Diagnosis Date Noted   Insomnia 09/18/2021   Acute cholecystitis s/p perc cholecystotomy drain 09/17/2021 09/16/2021   Chronic kidney disease, stage 3a ([place]) 09/16/2021   Tachypnea 09/16/2021   Iron deficiency anemia    Gastric polyp    Stomach irritation    Ectopic gastric mucosa    Polyp of colon    Angiodysplasia of intestinal tract    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, stage 3a ([place]) 02/10/2020   COPD exacerbation ([place]) 21/82/8833   Chronic systolic heart failure ([place]) 08/29/2018   Acute on chronic heart failure with preserved ejection fraction (HCC)    Symptomatic anemia 07/20/2018   Dementia with behavioral problem 10/26/2016   Alcohol abuse 10/26/2016   Memory loss 10/12/2016   Gait abnormality 10/12/2016   Orthostatic dizziness 10/12/2016   Paresthesia 10/12/2016   Abnormal EKG 12/28/2010   HTN (hypertension)    Dyslipidemia    PFO (patent foramen ovale)    Retinal artery occlusion    GERD (gastroesophageal reflux disease)    PCP:  Susy Frizzle, MD Pharmacy:   [facility] [address] [place] [id number] Phone: [phone] Fax: [phone]     Social Determinants of Health (SDOH) Interventions    Readmission Risk Interventions Readmission Risk Prevention Plan 09/20/2021  Transportation Screening Complete  PCP or Specialist Appt within 5-7 Days Complete  Home Care Screening Complete  Medication Review (RN CM) Complete  Some recent data might be hidden

## 2021-09-21 DIAGNOSIS — K219 Gastro-esophageal reflux disease without esophagitis: Secondary | ICD-10-CM | POA: Diagnosis not present

## 2021-09-21 DIAGNOSIS — R0682 Tachypnea, not elsewhere classified: Secondary | ICD-10-CM | POA: Diagnosis not present

## 2021-09-21 DIAGNOSIS — J441 Chronic obstructive pulmonary disease with (acute) exacerbation: Secondary | ICD-10-CM | POA: Diagnosis not present

## 2021-09-21 DIAGNOSIS — Z743 Need for continuous supervision: Secondary | ICD-10-CM | POA: Diagnosis not present

## 2021-09-21 DIAGNOSIS — I10 Essential (primary) hypertension: Secondary | ICD-10-CM | POA: Diagnosis not present

## 2021-09-21 DIAGNOSIS — R6889 Other general symptoms and signs: Secondary | ICD-10-CM | POA: Diagnosis not present

## 2021-09-21 DIAGNOSIS — I5022 Chronic systolic (congestive) heart failure: Secondary | ICD-10-CM | POA: Diagnosis not present

## 2021-09-21 DIAGNOSIS — Z79899 Other long term (current) drug therapy: Secondary | ICD-10-CM | POA: Diagnosis not present

## 2021-09-21 DIAGNOSIS — E785 Hyperlipidemia, unspecified: Secondary | ICD-10-CM | POA: Diagnosis not present

## 2021-09-21 DIAGNOSIS — N1831 Chronic kidney disease, stage 3a: Secondary | ICD-10-CM | POA: Diagnosis not present

## 2021-09-21 DIAGNOSIS — R197 Diarrhea, unspecified: Secondary | ICD-10-CM | POA: Diagnosis not present

## 2021-09-21 DIAGNOSIS — G4700 Insomnia, unspecified: Secondary | ICD-10-CM | POA: Diagnosis not present

## 2021-09-21 DIAGNOSIS — Z9049 Acquired absence of other specified parts of digestive tract: Secondary | ICD-10-CM | POA: Diagnosis not present

## 2021-09-21 DIAGNOSIS — J449 Chronic obstructive pulmonary disease, unspecified: Secondary | ICD-10-CM | POA: Diagnosis not present

## 2021-09-21 DIAGNOSIS — H1032 Unspecified acute conjunctivitis, left eye: Secondary | ICD-10-CM | POA: Diagnosis not present

## 2021-09-21 DIAGNOSIS — R109 Unspecified abdominal pain: Secondary | ICD-10-CM | POA: Diagnosis not present

## 2021-09-21 DIAGNOSIS — F03918 Unspecified dementia, unspecified severity, with other behavioral disturbance: Secondary | ICD-10-CM | POA: Diagnosis not present

## 2021-09-21 DIAGNOSIS — J9621 Acute and chronic respiratory failure with hypoxia: Secondary | ICD-10-CM

## 2021-09-21 DIAGNOSIS — K810 Acute cholecystitis: Secondary | ICD-10-CM | POA: Diagnosis not present

## 2021-09-21 DIAGNOSIS — R202 Paresthesia of skin: Secondary | ICD-10-CM | POA: Diagnosis not present

## 2021-09-21 DIAGNOSIS — Z7401 Bed confinement status: Secondary | ICD-10-CM | POA: Diagnosis not present

## 2021-09-21 DIAGNOSIS — K819 Cholecystitis, unspecified: Secondary | ICD-10-CM | POA: Diagnosis not present

## 2021-09-21 DIAGNOSIS — R410 Disorientation, unspecified: Secondary | ICD-10-CM | POA: Diagnosis not present

## 2021-09-21 DIAGNOSIS — F32A Depression, unspecified: Secondary | ICD-10-CM | POA: Diagnosis not present

## 2021-09-21 DIAGNOSIS — R5381 Other malaise: Secondary | ICD-10-CM | POA: Diagnosis not present

## 2021-09-21 DIAGNOSIS — Z48815 Encounter for surgical aftercare following surgery on the digestive system: Secondary | ICD-10-CM | POA: Diagnosis not present

## 2021-09-21 LAB — CULTURE, BLOOD (ROUTINE X 2)
Culture: NO GROWTH
Culture: NO GROWTH
Special Requests: ADEQUATE
Special Requests: ADEQUATE

## 2021-09-21 LAB — RESP PANEL BY RT-PCR (FLU A&B, COVID) ARPGX2
Influenza A by PCR: NEGATIVE
Influenza B by PCR: NEGATIVE
SARS Coronavirus 2 by RT PCR: NEGATIVE

## 2021-09-21 MED ORDER — AMOXICILLIN-POT CLAVULANATE 500-125 MG PO TABS: 1.0000 | ORAL_TABLET | Freq: Three times a day (TID) | ORAL | Status: AC

## 2021-09-21 MED ORDER — IPRATROPIUM-ALBUTEROL 0.5-2.5 (3) MG/3ML IN SOLN: 3.0000 mL | RESPIRATORY_TRACT | Status: DC | PRN

## 2021-09-21 MED ORDER — ENSURE SURGERY PO LIQD: 237.0000 mL | Freq: Two times a day (BID) | ORAL | Status: DC

## 2021-09-21 NOTE — Progress Notes (Signed)
Referring Physician(s): Nickola Major Stechschulte  Supervising Physician: Mir, Sharen Heck  Patient Status:  Virginia Walters - In-pt  Chief Complaint:   cholecystitis  Subjective: Pt resting quietly in bed; denies fever/chills/worsening abd pain,N/V; still appears fatigued    Allergies: Oxycodone  Medications: Prior to Admission medications   Medication Sig Start Date End Date Taking? Authorizing Provider  amoxicillin-clavulanate (AUGMENTIN) 500-125 MG tablet Take 1 tablet (500 mg total) by mouth 3 (three) times daily for 5 days. 09/21/21 09/26/21 Yes Mariel Aloe, MD  B Complex-Biotin-FA (B-COMPLEX PO) Take 1 capsule by mouth daily.    Yes [provider]  Calcium Carbonate-Vitamin D (CALCIUM + D PO) Take 1 capsule by mouth 2 (two) times daily.   Yes [provider]  Cholecalciferol (VITAMIN D) 2000 UNITS CAPS Take 1 capsule by mouth daily.   Yes [provider]  dorzolamide (TRUSOPT) 2 % ophthalmic solution Place 1 drop into the left eye 2 (two) times daily.   Yes [provider]  doxazosin (CARDURA) 4 MG tablet TAKE ONE-HALF TABLET BY  MOUTH DAILY Patient taking differently: Take 2 mg by mouth daily. 05/31/21  Yes Susy Frizzle, MD  ferrous sulfate 325 (65 FE) MG EC tablet Take 1 tablet (325 mg total) by mouth 2 (two) times daily. 02/21/20 09/16/21 Yes Susy Frizzle, MD  fish oil-omega-3 fatty acids 1000 MG capsule Take 1,000 mg by mouth daily.   Yes [provider]  hydrochlorothiazide (HYDRODIURIL) 25 MG tablet Take 1 tablet (25 mg total) by mouth daily. 03/05/20  Yes Susy Frizzle, MD  latanoprost (XALATAN) 0.005 % ophthalmic solution Place 1 drop into the left eye at bedtime.  12/05/17  Yes [provider]  lisinopril (ZESTRIL) 40 MG tablet TAKE 1 TABLET BY MOUTH  DAILY Patient taking differently: Take 40 mg by mouth daily. 06/07/21  Yes Susy Frizzle, MD  magnesium 30 MG tablet Take 30 mg by mouth 2 (two) times daily.   Yes  [provider]  melatonin 5 MG TABS Take 5 mg by mouth at bedtime.   Yes [provider]  Multiple Vitamin (MULTIVITAMIN) tablet Take 1 tablet by mouth daily.   Yes [provider]  potassium chloride (KLOR-CON) 10 MEQ tablet TAKE 1 TABLET BY MOUTH  DAILY Patient taking differently: 10 mEq daily. 11/11/20  Yes Susy Frizzle, MD  pravastatin (PRAVACHOL) 40 MG tablet TAKE 1 TABLET BY MOUTH  DAILY Patient taking differently: Take 40 mg by mouth daily. 12/30/20  Yes Susy Frizzle, MD  traZODone (DESYREL) 50 MG tablet TAKE 1 TABLET BY MOUTH AT  BEDTIME Patient taking differently: Take 50 mg by mouth at bedtime. 01/28/21  Yes Susy Frizzle, MD  Turmeric 500 MG CAPS Take 500 mg by mouth daily.    Yes [provider]  vitamin E 180 MG (400 UNITS) capsule Take 400 Units by mouth daily.   Yes [provider]  feeding supplement (ENSURE SURGERY) LIQD Take 237 mLs by mouth 2 (two) times daily between meals. 09/21/21   Mariel Aloe, MD  ipratropium-albuterol (DUONEB) 0.5-2.5 (3) MG/3ML SOLN Take 3 mLs by nebulization every 4 (four) hours as needed (Wheezing or shortness of breath). 09/21/21   Mariel Aloe, MD  SYMBICORT 160-4.5 MCG/ACT inhaler USE 2 INHALATIONS BY MOUTH  TWICE DAILY Patient not taking: Reported on 09/16/2021 03/04/20   Susy Frizzle, MD  venlafaxine XR (EFFEXOR-XR) 75 MG 24 hr capsule TAKE 1 CAPSULE BY MOUTH  DAILY WITH BREAKFAST 04/09/21   Susy Frizzle, MD  verapamil (CALAN-SR) 240 MG CR tablet TAKE 1 TABLET BY MOUTH AT  BEDTIME 07/05/21   Susy Frizzle, MD     Vital Signs: BP (!) 167/63 (BP Location: Left Arm)    Pulse (!) 53    Temp 98.2 F (36.8 C) (Oral)    Resp 18    Ht 5\' 5"  (1.651 m)    Wt 236 lb 8.9 oz (107.3 kg)    SpO2 96%    BMI 39.36 kg/m   Physical Exam; awake/alert; GB drain intact, insertion site ok, not sig tender,  OP 50 cc turbid, beige colored fluid; drain flushed without difficulty  Imaging: IR Perc  Cholecystostomy  Result Date: 09/17/2021 INDICATION: Acute cholecystitis. Poor operative candidate. Please perform image guided cholecystostomy tube placement for infection source control purposes. EXAM: ULTRASOUND AND FLUOROSCOPIC-GUIDED CHOLECYSTOSTOMY TUBE PLACEMENT COMPARISON:  None. MEDICATIONS: The patient is currently admitted to the hospital and on intravenous antibiotics. Antibiotics were administered within an appropriate time frame prior to skin puncture. ANESTHESIA/SEDATION: Moderate (conscious) sedation was employed during this procedure as administered by the Interventional Radiology RN. A total of Versed 0.25 mg and Fentanyl 25 mcg was administered intravenously. Moderate Sedation Time: 15 minutes. The patient's level of consciousness and vital signs were monitored continuously by radiology nursing throughout the procedure under my direct supervision. CONTRAST:  10 cc Omnipaque 300 - administered into the gallbladder lumen. FLUOROSCOPY TIME:  1 minute, 6 seconds (46 mGy) COMPLICATIONS: None immediate. PROCEDURE: Informed written consent was obtained from the patient after a discussion of the risks, benefits and alternatives to treatment. Questions regarding the procedure were encouraged and answered. A timeout was performed prior to the initiation of the procedure. The right upper abdominal quadrant was prepped and draped in the usual sterile fashion, and a sterile drape was applied covering the operative field. Maximum barrier sterile technique with sterile gowns and gloves were used for the procedure. A timeout was performed prior to the initiation of the procedure. Local anesthesia was provided with 1% lidocaine with epinephrine. Ultrasound scanning of the right upper quadrant demonstrates a markedly dilated gallbladder. Of note, the patient reported pain with ultrasound imaging over the gallbladder. Utilizing a transhepatic approach, a 22 gauge needle was advanced into the gallbladder under  direct ultrasound guidance. An ultrasound image was saved for documentation purposes. Appropriate intraluminal puncture was confirmed with the efflux of bile and advancement of an 0.018 wire into the gallbladder lumen. The needle was exchanged for an [place] set. A small amount of contrast was injected to confirm appropriate intraluminal positioning. Over a [place] wire, a 63.2-French Cook cholecystomy tube was advanced into the gallbladder fossa, coiled and locked. Bile was aspirated and a small amount of contrast was injected as several post procedural spot radiographic images were obtained in various obliquities. The catheter was secured to the skin with suture, connected to a drainage bag and a dressing was applied. The patient tolerated the procedure well without immediate post procedural complication. IMPRESSION: Successful ultrasound and fluoroscopic guided placement of a 10.2 French cholecystostomy tube. Electronically Signed   By: Sandi Mariscal M.D.   On: 09/17/2021 13:50    Labs:  CBC: Recent Labs    09/17/21 0723 09/18/21 1012 09/19/21 0501 09/20/21 0831  WBC 20.7* 11.4* 11.0* 10.7*  HGB 12.5 11.1* 11.6* 12.2  HCT 38.3 35.1* 36.1 37.1  PLT 181 167 172 213    COAGS: Recent Labs    07/22/21  1854 09/17/21 0940  INR 1.1 1.4*  APTT 31  --     BMP: Recent Labs    11/13/20 1438 07/22/21 1854 07/22/21 1903 07/27/21 1440 09/16/21 1354 09/17/21 0723 09/19/21 0501 09/20/21 0831  NA 141 136   < > 141 136 136 137  --   K 4.7 3.5   < > 4.2 3.6 3.5 3.2* 3.4*  CL 107 104   < > 106 102 103 102  --   CO2 23 23  --  23 23 23 26   --   GLUCOSE 112* 145*   < > 102* 131* 158* 101*  --   BUN 24 11   < > 18 17 18 23   --   CALCIUM 9.9 9.6  --  9.4 9.6 8.7* 8.9  --   CREATININE 1.31* 1.17*   < > 1.07* 0.81 0.99 0.96  --   GFRNONAA 39* 47*  --   --  >60 58* 60*  --   GFRAA 45*  --   --   --   --   --   --   --    < > = values in this interval not displayed.    LIVER FUNCTION  TESTS: Recent Labs    07/22/21 1854 07/27/21 1440 09/16/21 1354 09/17/21 0723 09/19/21 0501  BILITOT 0.8 0.4 0.8 1.1 0.6  AST 22 32 16 19 33  ALT 25 36* 19 19 33  ALKPHOS 68  --  54 60 79  PROT 6.7 6.7 7.5 6.6 6.2*  ALBUMIN 3.5  --  3.9 3.3* 2.9*    Assessment and Plan: Pt with hx acute cholecystitis, poor surgical candidate; s/p perc GB drain 2/3; afebrile; WBC 10.7, bile cx- klebsiella/enterococcus Drain Location: RUQ Size: Fr size: 10 Fr Date of placement: 09/17/21  Currently to: Drain collection device: gravity 24 hour output:  Output by Drain (mL) 09/19/21 0701 - 09/19/21 1900 09/19/21 1901 - 09/20/21 0700 09/20/21 0701 - 09/20/21 1900 09/20/21 1901 - 09/21/21 0700 09/21/21 0701 - 09/21/21 1213  Biliary Tube Cook slip-coat 10 Fr. RLQ 80 20 30 35 50      Current examination: Flushes/aspirates easily.  Insertion site unremarkable. Suture and stat lock in place. Dressed appropriately.   Plan: As OP , recommend once daily flush of drain with 5 cc sterile saline, output recording, dressing change every 2-3 days; pt will be set up for IR f/u in 4-6 weeks; call [phone] or [phone] /[phone] with any drain related questions    Electronically Signed: D. Rowe Robert, PA-C 09/21/2021, 12:09 PM   I spent a total of 15 Minutes at the the patient's bedside AND on the patient's hospital floor or unit, greater than 50% of which was counseling/coordinating care for gallbladder drain    Patient ID: [name], female   DOB: [birth year], 81 y.o.   MRN: [id number]

## 2021-09-21 NOTE — Discharge Instructions (Addendum)
Virginia Walters,  You were in the hospital with a gallbladder infection (cholecystitis). You had a drain placed. This drain will be managed by the radiology department. You will need to follow-up with the surgeon for likely surgical removal of your gallbladder (cholecystectomy). Please continue the antibiotics.   Recommend once daily flush of abdominal drain with 5 cc sterile saline, output recording, dressing change every 2-3 days; pt will be set up for IR f/u in 4-6 weeks; call [phone] or [phone] /[phone] with any drain related questions

## 2021-09-21 NOTE — TOC Transition Note (Signed)
Transition of Care [facility]) - CM/SW Discharge Note   Patient Details  Name: Virginia Walters MRN: [id number] Date of Birth: 04/01/1941  Transition of Care [facility]) CM/SW Contact:  Lennart Pall, LCSW Phone Number: 09/21/2021, 1:56 PM   Clinical Narrative:    Have received insurance authorization for SNF [phone]). Reviewed SNF bed offers and pt has chosen bed at [facility] who can admit pt today.  Medically cleared and PTAR called at 1:50pm.  Pt and daughter all aware and agreeable.  RN to call report to [phone].   Final next level of care: Skilled Nursing Facility Barriers to Discharge: Barriers Resolved   Patient Goals and CMS Choice Patient states their goals for this hospitalization and ongoing recovery are:: to return home      Discharge Placement PASRR number recieved: 09/21/21            Patient chooses bed at: [place], [place] Patient to be transferred to facility by: [place] Name of family member notified: daughter, Anderson Malta Patient and family notified of of transfer: 09/21/21  Discharge Plan and Services In-house Referral: Clinical Social Work              DME Arranged: N/A DME Agency: NA                  Social Determinants of Health ([place]) Interventions     Readmission Risk Interventions Readmission Risk Prevention Plan 09/20/2021  Transportation Screening Complete  PCP or Specialist Appt within 5-7 Days Complete  Home Care Screening Complete  Medication Review (RN CM) Complete  Some recent data might be hidden

## 2021-09-21 NOTE — Progress Notes (Signed)
PT demonstrated hands on understanding of Flutter device- NPC at this time. 

## 2021-09-21 NOTE — Discharge Summary (Signed)
Physician Discharge Summary   Patient: QUAMESHA MULLET MRN: [id number] DOB: 05-Feb-1941  Admit date:     09/16/2021  Discharge date: 09/21/21  Discharge Physician: Cordelia Poche   PCP: Susy Frizzle, MD   Recommendations at discharge:   Outpatient follow-up with PCP, General surgery and Interventional radiology Complete antibiotic regimen  Discharge Diagnoses: Principal Problem:   Acute cholecystitis s/p perc cholecystotomy drain 09/17/2021 Active Problems:   COPD exacerbation ([place])   Chronic systolic heart failure (HCC)   COPD with chronic bronchitis (HCC)   Acute on chronic respiratory failure with hypoxia (HCC)   HTN (hypertension)   Dyslipidemia   GERD (gastroesophageal reflux disease)   Dementia with behavioral problem   Chronic kidney disease, stage 3a ([place])   Paresthesia   Insomnia  Resolved Problems:   Tachypnea   Hospital Course: LILLIONA BLAKENEY is a 81 y.o. female with a history of COPD, chronic heart failure, memory loss vs dementia. Patient presented from her PCP's office secondary to abdominal pain with evidence of acute cholecystitis on CT imaging. Zosyn IV initiated. General surgery consulted who recommended IR consult for percutaneous cholecystostomy which was placed on 2/3. Patient with associated tachypnea and possible COPD exacerbation which has improved. Stable for discharge with transition to Augmentin to complete a 10 day course of antibiotics.  Assessment and Plan: * Acute cholecystitis s/p perc cholecystotomy drain 09/17/2021 Diagnosed on CT abdomen/pelvis. No evidence of choledocholithiasis on imaging. ALP, AST, ALT wnl. Associated leukocytosis. General surgery consulted and recommended IR consult for percutaneous cholecystostomy, which was completed on 2/3. Leukocytosis improving. Blood cultures with no growth to date. Zosyn transitioned to Augmentin for discharge with plan for total of 10 days of antibiotic treatment. Patient to follow-up with IR for  drain management and general surgery for consideration of cholecystectomy.  COPD exacerbation ([place]) Patient with worsening wheezing and tachypnea. No cough or sputum production. Wheezing seems more upper airway than lower. Completed 5 days of Solu-medrol. PT/OT recommending SNF. Resolved. Continue Duonebs on discharge. Continue previously prescribed Symbicort.  Tachypnea-resolved as of 09/21/2021 Possibly related to COPD exacerbation as patient developed worsening wheezing. Per patient, tachypnea was worse than baseline. No associated tachycardia or fluid overload. Repeat chest x-ray looked clear. Tachypnea has resolved. See problem, COPD exacerbation.  Acute on chronic respiratory failure with hypoxia (HCC) Patient has a history of hypoxia previously on oxygen which has been weaned off. Unsure if this was physician directed vs patient directed. Patient managed on 2 L/min of oxygen while inpatient. Can continue attempts to wean to room air as an outpatient.  COPD with chronic bronchitis ([place])- (present on admission) Patient has previously required oxygen which has been discontinued. Patient was prescribed Symbicort as an outpatient but is currently not using. See problem, COPD exacerbation.  Chronic systolic heart failure ([place])- (present on admission) Euvolemic. Patient's last LVEF mildly reduced at 45-50% in 2019. On lisinopril. Not on diuretic therapy. Noted that patient is on verapamil for blood pressure control. Weight on admission of 238.1 lbs (108 kg). BNP slightly elevated. Weight of 236.6 lbs on 2/5.  Chronic kidney disease, stage 3a (HCC) Creatinine stable and at baseline.  Dementia with behavioral problem- (present on admission) Noted on history. Patient lives alone with minimal support. Recommendation for SNF on discharge. Continue home Effexor.  Dyslipidemia- (present on admission) Patient is on Pravastatin as an outpatient. Held secondary to NPO status. Continue Pravastatin on  discharge.  HTN (hypertension)- (present on admission) Patient is on verapamil, lisinopril, hydrochlorothiazide and doxazosin  which were held secondary to NPO status and restarted gradually. Continue home regimen on discharge.  Insomnia Continue home trazodone and melatonin.           Consultants: General surgery, Interventional radiology Procedures performed: None  Disposition: Skilled nursing facility Diet recommendation:  Regular diet  DISCHARGE MEDICATION: Allergies as of 09/21/2021       Reactions   Oxycodone Itching        Medication List     TAKE these medications    amoxicillin-clavulanate 500-125 MG tablet Commonly known as: Augmentin Take 1 tablet (500 mg total) by mouth 3 (three) times daily for 5 days.   B-COMPLEX PO Take 1 capsule by mouth daily.   CALCIUM + D PO Take 1 capsule by mouth 2 (two) times daily.   dorzolamide 2 % ophthalmic solution Commonly known as: TRUSOPT Place 1 drop into the left eye 2 (two) times daily.   doxazosin 4 MG tablet Commonly known as: CARDURA TAKE ONE-HALF TABLET BY  MOUTH DAILY What changed:  how much to take how to take this when to take this additional instructions   feeding supplement Liqd Take 237 mLs by mouth 2 (two) times daily between meals.   ferrous sulfate 325 (65 FE) MG EC tablet Take 1 tablet (325 mg total) by mouth 2 (two) times daily.   fish oil-omega-3 fatty acids 1000 MG capsule Take 1,000 mg by mouth daily.   hydrochlorothiazide 25 MG tablet Commonly known as: HYDRODIURIL Take 1 tablet (25 mg total) by mouth daily.   ipratropium-albuterol 0.5-2.5 (3) MG/3ML Soln Commonly known as: DUONEB Take 3 mLs by nebulization every 4 (four) hours as needed (Wheezing or shortness of breath).   latanoprost 0.005 % ophthalmic solution Commonly known as: XALATAN Place 1 drop into the left eye at bedtime.   lisinopril 40 MG tablet Commonly known as: ZESTRIL TAKE 1 TABLET BY MOUTH  DAILY    magnesium 30 MG tablet Take 30 mg by mouth 2 (two) times daily.   melatonin 5 MG Tabs Take 5 mg by mouth at bedtime.   multivitamin tablet Take 1 tablet by mouth daily.   potassium chloride 10 MEQ tablet Commonly known as: KLOR-CON TAKE 1 TABLET BY MOUTH  DAILY What changed: how to take this   pravastatin 40 MG tablet Commonly known as: PRAVACHOL TAKE 1 TABLET BY MOUTH  DAILY   Symbicort 160-4.5 MCG/ACT inhaler Generic drug: budesonide-formoterol USE 2 INHALATIONS BY MOUTH  TWICE DAILY   traZODone 50 MG tablet Commonly known as: DESYREL TAKE 1 TABLET BY MOUTH AT  BEDTIME   Turmeric 500 MG Caps Take 500 mg by mouth daily.   venlafaxine XR 75 MG 24 hr capsule Commonly known as: EFFEXOR-XR TAKE 1 CAPSULE BY MOUTH  DAILY WITH BREAKFAST   verapamil 240 MG CR tablet Commonly known as: CALAN-SR TAKE 1 TABLET BY MOUTH AT  BEDTIME   Vitamin D 50 MCG (2000 UT) Caps Take 1 capsule by mouth daily.   vitamin E 180 MG (400 UNITS) capsule Take 400 Units by mouth daily.               Discharge Care Instructions  (From admission, onward)           Start     Ordered   09/21/21 0000  Discharge wound care:       Comments: Cholecystostomy drain: Once daily flush of drain with 5 cc sterile saline, output recording, dressing change every 2-3 days   09/21/21 1230  Follow-up Information     Stechschulte, Nickola Major, MD Follow up on 10/15/2021.   Specialty: Surgery Why: 220pm. Please arrive 30 minutes prior to your appointment for paperwork. Please bring a copy of your photo ID and insurance card. Contact information: [place] [place] [address] [phone]         [facility] Follow up.   Contact information: [place] [address] [phone]         Susy Frizzle, MD. Schedule an appointment as soon as possible for a visit in 1 week(s).   Specialty: Family Medicine Why: For  hospital follow-up Contact information: [address] [place] [address] [phone]                 Discharge Exam: Danley Danker Weights   09/17/21 0537 09/18/21 0500 09/19/21 0500  Weight: 106.4 kg 106.3 kg 107.3 kg   General exam: Appears calm and comfortable Respiratory system: Clear to auscultation. Respiratory effort normal. Cardiovascular system: S1 & S2 heard, RRR. No murmurs, rubs, gallops or clicks. Gastrointestinal system: Abdomen is slightly distended, soft and nontender. No organomegaly or masses felt. Normal bowel sounds heard. Central nervous system: Alert and oriented. No focal neurological deficits. Musculoskeletal: No edema. No calf tenderness Skin: No cyanosis. No rashes Psychiatry: Judgement and insight appear normal. Mood & affect appropriate.   Condition at discharge: stable  The results of significant diagnostics from this hospitalization (including imaging, microbiology, ancillary and laboratory) are listed below for reference.   Imaging Studies: CT ABDOMEN PELVIS W CONTRAST  Result Date: 09/16/2021 CLINICAL DATA:  Acute abdominal pain. Right-sided pain and vomiting for 2 days EXAM: CT ABDOMEN AND PELVIS WITH CONTRAST TECHNIQUE: Multidetector CT imaging of the abdomen and pelvis was performed using the standard protocol following bolus administration of intravenous contrast. RADIATION DOSE REDUCTION: This exam was performed according to the departmental dose-optimization program which includes automated exposure control, adjustment of the mA and/or kV according to patient size and/or use of iterative reconstruction technique. CONTRAST:  168mL OMNIPAQUE IOHEXOL 300 MG/ML  SOLN COMPARISON:  None. FINDINGS: Lower chest: No acute abnormality. Hepatobiliary: Diffuse low attenuation of the liver as can be seen with hepatic steatosis. No hepatic mass. No intrahepatic or extrahepatic biliary ductal dilatation. Distended gallbladder with cholelithiasis and  pericholecystic inflammatory changes as can be seen with acute cholecystitis. Pancreas: Unremarkable. No pancreatic ductal dilatation or surrounding inflammatory changes. Spleen: Normal in size without focal abnormality. Adrenals/Urinary Tract: Normal adrenal glands. 4.5 cm right interpolar fluid attenuating renal mass consistent with a cyst. No urolithiasis or obstructive uropathy. Normal bladder. Stomach/Bowel: Moderate size hiatal hernia. Stomach is otherwise unremarkable. Appendix appears normal. No evidence of bowel wall thickening, distention, or inflammatory changes. Incidental note made of a duodenal diverticulum. Diverticulosis without evidence of diverticulitis. Vascular/Lymphatic: Aortic atherosclerosis. No enlarged abdominal or pelvic lymph nodes. Reproductive: Uterus and bilateral adnexa are unremarkable. Other: No abdominal wall hernia or abnormality. No abdominopelvic ascites. Musculoskeletal: No acute osseous abnormality. Chronic T12 and L4 vertebral body compression fractures. Anterior bridging osteophytes throughout the thoracolumbar spine as can be seen with diffuse idiopathic skeletal hyperostosis. Degenerative disease with disc height loss at L3-4, L4-5 and L5-S1. Severe bilateral facet arthropathy at L3-4, L4-5 and L5-S1. Severe bilateral foraminal stenosis at L3-4. Severe right foraminal stenosis at L4-5. Severe right foraminal stenosis at L5-S1. Moderate left foraminal stenosis at L5-S1. IMPRESSION: 1. Distended gallbladder with cholelithiasis and pericholecystic inflammatory changes as can  be seen with acute cholecystitis. 2. Hepatic steatosis. 3. Moderate size hiatal hernia. 4. Diverticulosis without evidence of diverticulitis. 5. Diffuse lumbar spine spondylosis as described above. 6. Aortic Atherosclerosis (ICD10-I70.0). Electronically Signed   By: Kathreen Devoid M.D.   On: 09/16/2021 15:17   IR Perc Cholecystostomy  Result Date: 09/17/2021 INDICATION: Acute cholecystitis. Poor operative  candidate. Please perform image guided cholecystostomy tube placement for infection source control purposes. EXAM: ULTRASOUND AND FLUOROSCOPIC-GUIDED CHOLECYSTOSTOMY TUBE PLACEMENT COMPARISON:  None. MEDICATIONS: The patient is currently admitted to the hospital and on intravenous antibiotics. Antibiotics were administered within an appropriate time frame prior to skin puncture. ANESTHESIA/SEDATION: Moderate (conscious) sedation was employed during this procedure as administered by the Interventional Radiology RN. A total of Versed 0.25 mg and Fentanyl 25 mcg was administered intravenously. Moderate Sedation Time: 15 minutes. The patient's level of consciousness and vital signs were monitored continuously by radiology nursing throughout the procedure under my direct supervision. CONTRAST:  10 cc Omnipaque 300 - administered into the gallbladder lumen. FLUOROSCOPY TIME:  1 minute, 6 seconds (46 mGy) COMPLICATIONS: None immediate. PROCEDURE: Informed written consent was obtained from the patient after a discussion of the risks, benefits and alternatives to treatment. Questions regarding the procedure were encouraged and answered. A timeout was performed prior to the initiation of the procedure. The right upper abdominal quadrant was prepped and draped in the usual sterile fashion, and a sterile drape was applied covering the operative field. Maximum barrier sterile technique with sterile gowns and gloves were used for the procedure. A timeout was performed prior to the initiation of the procedure. Local anesthesia was provided with 1% lidocaine with epinephrine. Ultrasound scanning of the right upper quadrant demonstrates a markedly dilated gallbladder. Of note, the patient reported pain with ultrasound imaging over the gallbladder. Utilizing a transhepatic approach, a 22 gauge needle was advanced into the gallbladder under direct ultrasound guidance. An ultrasound image was saved for documentation purposes.  Appropriate intraluminal puncture was confirmed with the efflux of bile and advancement of an 0.018 wire into the gallbladder lumen. The needle was exchanged for an Bertie set. A small amount of contrast was injected to confirm appropriate intraluminal positioning. Over a Benson wire, a 42.2-French Cook cholecystomy tube was advanced into the gallbladder fossa, coiled and locked. Bile was aspirated and a small amount of contrast was injected as several post procedural spot radiographic images were obtained in various obliquities. The catheter was secured to the skin with suture, connected to a drainage bag and a dressing was applied. The patient tolerated the procedure well without immediate post procedural complication. IMPRESSION: Successful ultrasound and fluoroscopic guided placement of a 10.2 French cholecystostomy tube. Electronically Signed   By: Sandi Mariscal M.D.   On: 09/17/2021 13:50   DG CHEST PORT 1 VIEW  Result Date: 09/17/2021 CLINICAL DATA:  Tachypnea EXAM: PORTABLE CHEST 1 VIEW COMPARISON:  Chest radiograph 09/16/2021 FINDINGS: The heart is mildly enlarged, unchanged. The upper mediastinal contours are normal. There is no focal consolidation or pulmonary edema. There is no pleural effusion or pneumothorax. There is unchanged asymmetric elevation of the right hemidiaphragm. The bones are stable. IMPRESSION: Stable chest with no radiographic evidence of acute cardiopulmonary process. Electronically Signed   By: Valetta Mole M.D.   On: 09/17/2021 08:13   DG CHEST PORT 1 VIEW  Result Date: 09/16/2021 CLINICAL DATA:  Acute abdominal pain. EXAM: PORTABLE CHEST 1 VIEW COMPARISON:  July 22, 2021. FINDINGS: Stable cardiomediastinal silhouette. Both lungs are clear. The visualized  skeletal structures are unremarkable. IMPRESSION: No active disease. Electronically Signed   By: Marijo Conception M.D.   On: 09/16/2021 16:56    Microbiology: Results for orders placed or performed during the hospital  encounter of 09/16/21  Resp Panel by RT-PCR (Flu A&B, Covid) Nasopharyngeal Swab     Status: None   Collection Time: 09/16/21  3:29 PM   Specimen: Nasopharyngeal Swab; Nasopharyngeal(NP) swabs in vial transport medium  Result Value Ref Range Status   SARS Coronavirus 2 by RT PCR NEGATIVE NEGATIVE Final    Comment: (NOTE) SARS-CoV-2 target nucleic acids are NOT DETECTED.  The SARS-CoV-2 RNA is generally detectable in upper respiratory specimens during the acute phase of infection. The lowest concentration of SARS-CoV-2 viral copies this assay can detect is 138 copies/mL. A negative result does not preclude SARS-Cov-2 infection and should not be used as the sole basis for treatment or other patient management decisions. A negative result may occur with  improper specimen collection/handling, submission of specimen other than nasopharyngeal swab, presence of viral mutation(s) within the areas targeted by this assay, and inadequate number of viral copies(<138 copies/mL). A negative result must be combined with clinical observations, patient history, and epidemiological information. The expected result is Negative.  Fact Sheet for Patients:  EntrepreneurPulse.com.au  Fact Sheet for Healthcare Providers:  IncredibleEmployment.be  This test is no t yet approved or cleared by the [place] FDA and  has been authorized for detection and/or diagnosis of SARS-CoV-2 by FDA under an Emergency Use Authorization (EUA). This EUA will remain  in effect (meaning this test can be used) for the duration of the COVID-19 declaration under Section 564(b)(1) of the Act, 21 U.S.C.section 360bbb-3(b)(1), unless the authorization is terminated  or revoked sooner.       Influenza A by PCR NEGATIVE NEGATIVE Final   Influenza B by PCR NEGATIVE NEGATIVE Final    Comment: (NOTE) The Xpert Xpress SARS-CoV-2/FLU/RSV plus assay is intended as an aid in the diagnosis of  influenza from Nasopharyngeal swab specimens and should not be used as a sole basis for treatment. Nasal washings and aspirates are unacceptable for Xpert Xpress SARS-CoV-2/FLU/RSV testing.  Fact Sheet for Patients: EntrepreneurPulse.com.au  Fact Sheet for Healthcare Providers: IncredibleEmployment.be  This test is not yet approved or cleared by the [place] FDA and has been authorized for detection and/or diagnosis of SARS-CoV-2 by FDA under an Emergency Use Authorization (EUA). This EUA will remain in effect (meaning this test can be used) for the duration of the COVID-19 declaration under Section 564(b)(1) of the Act, 21 U.S.C. section 360bbb-3(b)(1), unless the authorization is terminated or revoked.  Performed at [facility] [place] [address], [place], [place] [address]   Culture, blood (routine x 2)     Status: None   Collection Time: 09/16/21  6:20 PM   Specimen: BLOOD RIGHT FOREARM  Result Value Ref Range Status   Specimen Description   Final    BLOOD RIGHT FOREARM BLOOD Performed at [place] [address], [place], [place] [address]    Special Requests   Final    BOTTLES DRAWN AEROBIC AND ANAEROBIC Blood Culture adequate volume Performed at [place] [address] [place], [place] [address]    Culture   Final    NO GROWTH 5 DAYS Performed at [facility], [place] [address] [place], [place] [address]    Report Status 09/21/2021 FINAL  Final  Culture, blood (routine x 2)  Status: None   Collection Time: 09/16/21  6:24 PM   Specimen: BLOOD  Result Value Ref Range Status   Specimen Description   Final    BLOOD RIGHT ANTECUBITAL Performed at [place] [address], [place], [place] [address]    Special Requests   Final    BOTTLES DRAWN AEROBIC AND ANAEROBIC Blood Culture adequate volume Performed at [place] [address], [place], [place] [address]    Culture   Final    NO GROWTH 5 DAYS Performed at [facility], [place] [address] [place], [place] [address]    Report Status 09/21/2021 FINAL  Final  Body fluid culture w Gram Stain     Status: None (Preliminary result)   Collection Time: 09/18/21  8:26 AM   Specimen: BILE; Body Fluid  Result Value Ref Range Status   Specimen Description   Final    BILE Performed at [place] [facility] [place] [address] [place], [place] [address]    Special Requests   Final    Normal Performed at [facility], [place] [address] [place], [place] [address]    Gram Stain   Final    MODERATE WBC PRESENT, PREDOMINANTLY PMN RARE GRAM VARIABLE ROD    Culture   Final    MODERATE KLEBSIELLA PNEUMONIAE RARE ENTEROCOCCUS FAECALIS SUSCEPTIBILITIES TO FOLLOW Performed at [facility], [place] [address], [place], [place] [address]    Report Status PENDING  Incomplete   Organism ID, Bacteria KLEBSIELLA PNEUMONIAE  Final      Susceptibility   Klebsiella pneumoniae - MIC*    AMPICILLIN >=32 RESISTANT Resistant     CEFAZOLIN <=4 SENSITIVE Sensitive     CEFEPIME <=0.12 SENSITIVE Sensitive     CEFTAZIDIME <=1 SENSITIVE Sensitive     CEFTRIAXONE <=0.25 SENSITIVE Sensitive     CIPROFLOXACIN <=0.25 SENSITIVE Sensitive     GENTAMICIN <=1 SENSITIVE Sensitive     IMIPENEM <=0.25 SENSITIVE Sensitive     TRIMETH/SULFA <=20 SENSITIVE Sensitive     AMPICILLIN/SULBACTAM <=2 SENSITIVE Sensitive     PIP/TAZO <=4 SENSITIVE Sensitive     * MODERATE KLEBSIELLA PNEUMONIAE    Labs: CBC: Recent Labs  Lab 09/16/21 1354 09/17/21 0723 09/18/21 1012 09/19/21 0501 09/20/21 0831  WBC 20.1* 20.7* 11.4* 11.0* 10.7*  NEUTROABS 16.7*  --  9.1*  --   --   HGB 13.6 12.5 11.1* 11.6* 12.2  HCT 41.2 38.3 35.1* 36.1 37.1  MCV 91.8 93.4 95.1 93.5 92.1  PLT 208 181 167 172 528   Basic Metabolic Panel: Recent Labs  Lab 09/16/21 1354  09/17/21 0723 09/19/21 0501 09/20/21 0831  NA 136 136 137  --   K 3.6 3.5 3.2* 3.4*  CL 102 103 102  --   CO2 23 23 26   --   GLUCOSE 131* 158* 101*  --   BUN 17 18 23   --   CREATININE 0.81 0.99 0.96  --   CALCIUM 9.6 8.7* 8.9  --   MG  --   --   --  1.9   Liver Function Tests: Recent Labs  Lab 09/16/21 1354 09/17/21 0723 09/19/21 0501  AST 16 19 33  ALT 19 19 33  ALKPHOS 54 60 79  BILITOT 0.8 1.1 0.6  PROT 7.5 6.6 6.2*  ALBUMIN 3.9 3.3* 2.9*   CBG: No results for input(s): GLUCAP in the last 168 hours.  Discharge time spent: 35 minutes.  Signed: Cordelia Poche, MD [facility] 09/21/2021

## 2021-09-21 NOTE — Assessment & Plan Note (Signed)
Patient has a history of hypoxia previously on oxygen which has been weaned off. Unsure if this was physician directed vs patient directed. Patient managed on 2 L/min of oxygen while inpatient. Can continue attempts to wean to room air as an outpatient.

## 2021-09-22 ENCOUNTER — Telehealth: Payer: Self-pay

## 2021-09-22 DIAGNOSIS — Z79899 Other long term (current) drug therapy: Secondary | ICD-10-CM | POA: Diagnosis not present

## 2021-09-22 LAB — BODY FLUID CULTURE W GRAM STAIN: Special Requests: NORMAL

## 2021-09-22 NOTE — Telephone Encounter (Signed)
Pt was on my Transitional Care Management report this morning. D/C summary requested pt f/u with you in 1 week but patient has been discharged to [facility] for rehab x 5 weeks. Thank you.

## 2021-09-23 DIAGNOSIS — E785 Hyperlipidemia, unspecified: Secondary | ICD-10-CM | POA: Diagnosis not present

## 2021-09-23 DIAGNOSIS — Z48815 Encounter for surgical aftercare following surgery on the digestive system: Secondary | ICD-10-CM | POA: Diagnosis not present

## 2021-09-23 DIAGNOSIS — F32A Depression, unspecified: Secondary | ICD-10-CM | POA: Diagnosis not present

## 2021-09-23 DIAGNOSIS — G4700 Insomnia, unspecified: Secondary | ICD-10-CM | POA: Diagnosis not present

## 2021-09-25 DIAGNOSIS — R197 Diarrhea, unspecified: Secondary | ICD-10-CM | POA: Diagnosis not present

## 2021-09-25 DIAGNOSIS — K819 Cholecystitis, unspecified: Secondary | ICD-10-CM | POA: Diagnosis not present

## 2021-09-25 DIAGNOSIS — I5022 Chronic systolic (congestive) heart failure: Secondary | ICD-10-CM | POA: Diagnosis not present

## 2021-09-29 DIAGNOSIS — J449 Chronic obstructive pulmonary disease, unspecified: Secondary | ICD-10-CM | POA: Diagnosis not present

## 2021-10-02 DIAGNOSIS — H1032 Unspecified acute conjunctivitis, left eye: Secondary | ICD-10-CM | POA: Diagnosis not present

## 2021-10-04 ENCOUNTER — Telehealth: Payer: Self-pay

## 2021-10-04 ENCOUNTER — Other Ambulatory Visit: Payer: Self-pay | Admitting: *Deleted

## 2021-10-04 NOTE — Telephone Encounter (Signed)
Transition Care Management Follow-up Telephone Call Date of discharge and from where: 10/03/2021 [facility] How have you been since you were released from the hospital? Spoke with pt's caregiver, [place], and she states pt is doing well.  Any questions or concerns? No  Items Reviewed: Did the pt receive and understand the discharge instructions provided? Yes  Medications obtained and verified? Yes  Other? No  Any new allergies since your discharge? No  Dietary orders reviewed? No Do you have support at home? Yes   Home Care and Equipment/Supplies: Were home health services ordered? yes If so, what is the name of the agency? Pt/caregiver were not advised of name and name is not in pt's chart.  Has the agency set up a time to come to the patient's home? no Were any new equipment or medical supplies ordered?  No What is the name of the medical supply agency? N/A Were you able to get the supplies/equipment? not applicable Do you have any questions related to the use of the equipment or supplies? No  Functional Questionnaire: (I = Independent and D = Dependent) ADLs: I  Bathing/Dressing- I  Meal Prep- I  Eating- I  Maintaining continence- I  Transferring/Ambulation- I  Managing Meds- I  Follow up appointments reviewed:  PCP Hospital f/u appt confirmed? Yes  Scheduled to see Dr. Dennard Schaumann on 12/03/2021 @ 4 pm. [facility] f/u appt confirmed?  Will need appointment to see general surgeon to have cholecystectomy performed.    Are transportation arrangements needed? No  If their condition worsens, is the pt aware to call PCP or go to the Emergency Dept.? Yes Was the patient provided with contact information for the PCP's office or ED? Yes Was to pt encouraged to call back with questions or concerns? Yes

## 2021-10-04 NOTE — Patient Outreach (Signed)
[facility] Post- Acute Care Coordinator follow up. Member screened for potential [facility] services as a a benefit of [name]'s insurance plan.   Verified in [facility] that [name] transitioned from [facility] to home on 10/03/21.  Confirmed with Bryson Ha, SNF SW that home health was arranged thru [facility] for PT/OT/RN services.  Member's PCP at [facility] has [place] care coordination services available. Appears [name] is active with CCM services with Pharmacist at PCP office. Will send notification to [facility] care guide to make aware member has returned home.      [name], MSN, RN,BSN [place] Acute Care Coordinator [phone] [facility]) [phone]  (Toll free office)

## 2021-10-05 ENCOUNTER — Telehealth: Payer: Self-pay | Admitting: *Deleted

## 2021-10-05 ENCOUNTER — Other Ambulatory Visit: Payer: Self-pay

## 2021-10-05 ENCOUNTER — Encounter: Payer: Self-pay | Admitting: Family Medicine

## 2021-10-05 ENCOUNTER — Ambulatory Visit (INDEPENDENT_AMBULATORY_CARE_PROVIDER_SITE_OTHER): Payer: Medicare Other | Admitting: Family Medicine

## 2021-10-05 VITALS — BP 128/76 | HR 93 | Temp 96.6°F | Resp 18 | Ht 65.0 in | Wt 223.0 lb

## 2021-10-05 DIAGNOSIS — K8051 Calculus of bile duct without cholangitis or cholecystitis with obstruction: Secondary | ICD-10-CM | POA: Diagnosis not present

## 2021-10-05 DIAGNOSIS — R413 Other amnesia: Secondary | ICD-10-CM

## 2021-10-05 DIAGNOSIS — J449 Chronic obstructive pulmonary disease, unspecified: Secondary | ICD-10-CM

## 2021-10-05 DIAGNOSIS — I10 Essential (primary) hypertension: Secondary | ICD-10-CM | POA: Diagnosis not present

## 2021-10-05 MED ORDER — BREZTRI AEROSPHERE 160-9-4.8 MCG/ACT IN AERO: 2.0000 | INHALATION_SPRAY | Freq: Two times a day (BID) | RESPIRATORY_TRACT | 3 refills | Status: DC

## 2021-10-05 NOTE — Progress Notes (Signed)
Subjective:    Patient ID: Virginia Walters, female    DOB: 01/10/1941, 81 y.o.   MRN: [id number]  HPI  Admit date:     09/16/2021  Discharge date: 09/21/21  Discharge Physician: Cordelia Poche    PCP: Susy Frizzle, MD    Recommendations at discharge:    Outpatient follow-up with PCP, General surgery and Interventional radiology Complete antibiotic regimen   Discharge Diagnoses: Principal Problem:   Acute cholecystitis s/p perc cholecystotomy drain 09/17/2021 Active Problems:   COPD exacerbation ([place])   Chronic systolic heart failure (HCC)   COPD with chronic bronchitis (HCC)   Acute on chronic respiratory failure with hypoxia (HCC)   HTN (hypertension)   Dyslipidemia   GERD (gastroesophageal reflux disease)   Dementia with behavioral problem   Chronic kidney disease, stage 3a ([place])   Paresthesia   Insomnia   Resolved Problems:   Tachypnea     Hospital Course: GESELLE Walters is a 81 y.o. female with a history of COPD, chronic heart failure, memory loss vs dementia. Patient presented from her PCP's office secondary to abdominal pain with evidence of acute cholecystitis on CT imaging. Zosyn IV initiated. General surgery consulted who recommended IR consult for percutaneous cholecystostomy which was placed on 2/3. Patient with associated tachypnea and possible COPD exacerbation which has improved. Stable for discharge with transition to Augmentin to complete a 10 day course of antibiotics.   Assessment and Plan: * Acute cholecystitis s/p perc cholecystotomy drain 09/17/2021 Diagnosed on CT abdomen/pelvis. No evidence of choledocholithiasis on imaging. ALP, AST, ALT wnl. Associated leukocytosis. General surgery consulted and recommended IR consult for percutaneous cholecystostomy, which was completed on 2/3. Leukocytosis improving. Blood cultures with no growth to date. Zosyn transitioned to Augmentin for discharge with plan for total of 10 days of antibiotic treatment.  Patient to follow-up with IR for drain management and general surgery for consideration of cholecystectomy.   COPD exacerbation ([place]) Patient with worsening wheezing and tachypnea. No cough or sputum production. Wheezing seems more upper airway than lower. Completed 5 days of Solu-medrol. PT/OT recommending SNF. Resolved. Continue Duonebs on discharge. Continue previously prescribed Symbicort.   Tachypnea-resolved as of 09/21/2021 Possibly related to COPD exacerbation as patient developed worsening wheezing. Per patient, tachypnea was worse than baseline. No associated tachycardia or fluid overload. Repeat chest x-ray looked clear. Tachypnea has resolved. See problem, COPD exacerbation.   Acute on chronic respiratory failure with hypoxia (HCC) Patient has a history of hypoxia previously on oxygen which has been weaned off. Unsure if this was physician directed vs patient directed. Patient managed on 2 L/min of oxygen while inpatient. Can continue attempts to wean to room air as an outpatient.   COPD with chronic bronchitis ([place])- (present on admission) Patient has previously required oxygen which has been discontinued. Patient was prescribed Symbicort as an outpatient but is currently not using. See problem, COPD exacerbation.   Chronic systolic heart failure ([place])- (present on admission) Euvolemic. Patient's last LVEF mildly reduced at 45-50% in 2019. On lisinopril. Not on diuretic therapy. Noted that patient is on verapamil for blood pressure control. Weight on admission of 238.1 lbs (108 kg). BNP slightly elevated. Weight of 236.6 lbs on 2/5.   Chronic kidney disease, stage 3a (HCC) Creatinine stable and at baseline.   Dementia with behavioral problem- (present on admission) Noted on history. Patient lives alone with minimal support. Recommendation for SNF on discharge. Continue home Effexor.   Dyslipidemia- (present on admission) Patient is on Pravastatin  as an outpatient. Held secondary to  NPO status. Continue Pravastatin on discharge.   HTN (hypertension)- (present on admission) Patient is on verapamil, lisinopril, hydrochlorothiazide and doxazosin which were held secondary to NPO status and restarted gradually. Continue home regimen on discharge.   Insomnia Continue home trazodone and melatonin.   10/05/21 Patient is here today for follow-up.  She has moved back home from the skilled nursing facility.  She is here today with her sister.  She admits that she had not been taking Symbicort regularly.  She states that she has been out of medication for several months prior to her most recent COPD exacerbation.  She is back on the medication now.  She is doing well and denies any shortness of breath.  She denies any chest pain.  Her breathing is nonlabored and her lungs are clear.  She still has her percutaneous drain in place.  It is draining approximately 250 to 270 cc of brownish-yellow transparent fluid daily.  Its not opaque.  She denies any fever or chills.  She denies any abdominal pain.  She denies any nausea or vomiting.  She has not yet followed up with interventional radiology.  She has not seen general surgery. Past Medical History:  Diagnosis Date   CHF (congestive heart failure) (HCC)    Dyslipidemia    Frequent falls    GERD (gastroesophageal reflux disease)    HTN (hypertension)    Memory loss    PFO (patent foramen ovale)    Retinal artery occlusion    Weakness    Past Surgical History:  Procedure Laterality Date   ANKLE ARTHROSCOPY WITH OPEN REDUCTION INTERNAL FIXATION (ORIF) Right    APPENDECTOMY     COLONOSCOPY N/A 07/21/2018   Procedure: COLONOSCOPY;  Surgeon: Lin Landsman, MD;  Location: [place];  Service: Gastroenterology;  Laterality: N/A;   COLONOSCOPY WITH PROPOFOL N/A 04/01/2020   Procedure: COLONOSCOPY WITH PROPOFOL;  Surgeon: Virgel Manifold, MD;  Location: [facility] ENDOSCOPY;  Service: Endoscopy;  Laterality: N/A;    ESOPHAGOGASTRODUODENOSCOPY N/A 07/21/2018   Procedure: ESOPHAGOGASTRODUODENOSCOPY (EGD);  Surgeon: Lin Landsman, MD;  Location: [facility] ENDOSCOPY;  Service: Gastroenterology;  Laterality: N/A;   ESOPHAGOGASTRODUODENOSCOPY N/A 02/11/2020   Procedure: ESOPHAGOGASTRODUODENOSCOPY (EGD);  Surgeon: Virgel Manifold, MD;  Location: [facility] ENDOSCOPY;  Service: Endoscopy;  Laterality: N/A;   ESOPHAGOGASTRODUODENOSCOPY (EGD) WITH PROPOFOL N/A 04/01/2020   Procedure: ESOPHAGOGASTRODUODENOSCOPY (EGD) WITH PROPOFOL;  Surgeon: Virgel Manifold, MD;  Location: [facility] ENDOSCOPY;  Service: Endoscopy;  Laterality: N/A;   IR PERC CHOLECYSTOSTOMY  09/17/2021   TUBAL LIGATION     Current Outpatient Medications on File Prior to Visit  Medication Sig Dispense Refill   B Complex-Biotin-FA (B-COMPLEX PO) Take 1 capsule by mouth daily.      Calcium Carbonate-Vitamin D (CALCIUM + D PO) Take 1 capsule by mouth 2 (two) times daily.     Cholecalciferol (VITAMIN D) 2000 UNITS CAPS Take 1 capsule by mouth daily.     dorzolamide (TRUSOPT) 2 % ophthalmic solution Place 1 drop into the left eye 2 (two) times daily.     doxazosin (CARDURA) 4 MG tablet TAKE ONE-HALF TABLET BY  MOUTH DAILY (Patient taking differently: Take 2 mg by mouth daily.) 45 tablet 3   feeding supplement (ENSURE SURGERY) LIQD Take 237 mLs by mouth 2 (two) times daily between meals.     ferrous sulfate 325 (65 FE) MG EC tablet Take 1 tablet (325 mg total) by mouth 2 (two) times daily. 60 tablet  11   fish oil-omega-3 fatty acids 1000 MG capsule Take 1,000 mg by mouth daily.     hydrochlorothiazide (HYDRODIURIL) 25 MG tablet Take 1 tablet (25 mg total) by mouth daily. 90 tablet 3   ipratropium-albuterol (DUONEB) 0.5-2.5 (3) MG/3ML SOLN Take 3 mLs by nebulization every 4 (four) hours as needed (Wheezing or shortness of breath). 360 mL    latanoprost (XALATAN) 0.005 % ophthalmic solution Place 1 drop into the left eye at bedtime.      lisinopril (ZESTRIL) 40 MG  tablet TAKE 1 TABLET BY MOUTH  DAILY (Patient taking differently: Take 40 mg by mouth daily.) 90 tablet 3   magnesium 30 MG tablet Take 30 mg by mouth 2 (two) times daily.     melatonin 5 MG TABS Take 5 mg by mouth at bedtime.     Multiple Vitamin (MULTIVITAMIN) tablet Take 1 tablet by mouth daily.     potassium chloride (KLOR-CON) 10 MEQ tablet TAKE 1 TABLET BY MOUTH  DAILY (Patient taking differently: 10 mEq daily.) 90 tablet 3   pravastatin (PRAVACHOL) 40 MG tablet TAKE 1 TABLET BY MOUTH  DAILY (Patient taking differently: Take 40 mg by mouth daily.) 90 tablet 3   SYMBICORT 160-4.5 MCG/ACT inhaler USE 2 INHALATIONS BY MOUTH  TWICE DAILY 30.6 g 3   traZODone (DESYREL) 50 MG tablet TAKE 1 TABLET BY MOUTH AT  BEDTIME (Patient taking differently: Take 50 mg by mouth at bedtime.) 90 tablet 3   Turmeric 500 MG CAPS Take 500 mg by mouth daily.      venlafaxine XR (EFFEXOR-XR) 75 MG 24 hr capsule TAKE 1 CAPSULE BY MOUTH  DAILY WITH BREAKFAST 90 capsule 3   verapamil (CALAN-SR) 240 MG CR tablet TAKE 1 TABLET BY MOUTH AT  BEDTIME 90 tablet 3   vitamin E 180 MG (400 UNITS) capsule Take 400 Units by mouth daily.     No current facility-administered medications on file prior to visit.   Allergies  Allergen Reactions   Oxycodone Itching   Social History   Socioeconomic History   Marital status: Widowed    Spouse name: Not on file   Number of children: 1   Years of education: HS   Highest education level: Not on file  Occupational History   Occupation: Retired  Tobacco Use   Smoking status: Former    Packs/day: 1.00    Years: 15.00    Pack years: 15.00    Types: Cigarettes    Quit date: 12/27/1980    Years since quitting: 40.8   Smokeless tobacco: Never  Vaping Use   Vaping Use: Never used  Substance and Sexual Activity   Alcohol use: Yes    Comment: occassionally   Drug use: No   Sexual activity: Not Currently  Other Topics Concern   Not on file  Social History Narrative   Lives  at home alone. Husband passed away 2012/11/21.   Right-handed.   No daily use of caffeine.   Daughter lives in [place].   Social Determinants of Health   Financial Resource Strain: Low Risk    Difficulty of Paying Living Expenses: Not hard at all  Food Insecurity: No Food Insecurity   Worried About Charity fundraiser in the Last Year: Never true   [place] in the Last Year: Never true  Transportation Needs: No Transportation Needs   Lack of Transportation (Medical): No   Lack of Transportation (Non-Medical): No  Physical Activity: Inactive   Days  of Exercise per Week: 0 days   Minutes of Exercise per Session: 0 min  Stress: No Stress Concern Present   Feeling of Stress : Not at all  Social Connections: Moderately Integrated   Frequency of Communication with Friends and Family: Three times a week   Frequency of Social Gatherings with Friends and Family: Three times a week   Attends Religious Services: More than 4 times per year   Active Member of Clubs or Organizations: Yes   Attends Archivist Meetings: More than 4 times per year   Marital Status: Widowed  Human resources officer Violence: Not At Risk   Fear of Current or Ex-Partner: No   Emotionally Abused: No   Physically Abused: No   Sexually Abused: No     Review of Systems  All other systems reviewed and are negative.     Objective:   Physical Exam Constitutional:      General: She is not in acute distress.    Appearance: She is not ill-appearing, toxic-appearing or diaphoretic.  HENT:     Mouth/Throat:     Mouth: Mucous membranes are moist.  Neck:     Vascular: No carotid bruit.  Cardiovascular:     Rate and Rhythm: Normal rate and regular rhythm.     Heart sounds: Normal heart sounds. No murmur heard. Pulmonary:     Effort: Pulmonary effort is normal. No respiratory distress.     Breath sounds: No stridor. No wheezing, rhonchi or rales.  Chest:     Chest wall: No tenderness.  Musculoskeletal:      Right lower leg: No edema.     Left lower leg: No edema.  Neurological:     General: No focal deficit present.     Mental Status: She is alert. Mental status is at baseline.     Sensory: Sensation is intact.     Coordination: Coordination is intact.     Gait: Gait is intact.          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  She is walking with a walker.  She states that she is able to go to the restroom.  She denies any falling.  He is taking her pills as prescribed in her pill packs.  However she has 2 prescriptions of vitamin B12, magnesium, and vitamin E..  She has been taking them redundantly.  She also stopped her Symbicort leading to the most recent exacerbation.  I believe this is all an indication of her mild memory loss and dementia.  Unfortunately she has no children.  She declines living in a nursing facility or an assisted living facility.  At the present time she is still capable of making medical decisions for herself.  I reviewed her medication list with her.  I want to stop Symbicort and replace it with Breztri 2 inhalations twice daily to hopefully help prevent future COPD exacerbations.  She no longer requires oxygen.  I will schedule the patient to follow-up with interventional radiology as planned sometime after March 7.  Once they have removed percutaneous drain, I will plan to get her scheduled to see general surgery to have her gall bladder removed.

## 2021-10-05 NOTE — Chronic Care Management (AMB) (Signed)
°  Care Management   Note  10/05/2021 Name: Virginia Walters MRN: [id number] DOB: May 20, 1941  Parke Poisson [place] is a 81 y.o. year old female who is a primary care patient of Susy Frizzle, MD and is actively engaged with the care management team. I reached out to [name] by phone today to assist with scheduling an initial visit with the RN Case Manager  Follow up plan: Unsuccessful telephone outreach attempt made. A HIPAA compliant phone message was left for the patient providing contact information and requesting a return call.  The care management team will reach out to the patient again over the next 7 days.  If patient returns call to provider office, please advise to call [place] at [phone].  [place] Management  Direct Dial: [phone]

## 2021-10-06 LAB — COMPLETE METABOLIC PANEL WITH GFR
AG Ratio: 1.4 (calc) (ref 1.0–2.5)
ALT: 25 U/L (ref 6–29)
AST: 17 U/L (ref 10–35)
Albumin: 4 g/dL (ref 3.6–5.1)
Alkaline phosphatase (APISO): 66 U/L (ref 37–153)
BUN/Creatinine Ratio: 15 (calc) (ref 6–22)
BUN: 52 mg/dL — ABNORMAL HIGH (ref 7–25)
CO2: 21 mmol/L (ref 20–32)
Calcium: 9.3 mg/dL (ref 8.6–10.4)
Chloride: 104 mmol/L (ref 98–110)
Creat: 3.38 mg/dL — ABNORMAL HIGH (ref 0.60–0.95)
Globulin: 2.8 g/dL (calc) (ref 1.9–3.7)
Glucose, Bld: 148 mg/dL — ABNORMAL HIGH (ref 65–99)
Potassium: 4.9 mmol/L (ref 3.5–5.3)
Sodium: 139 mmol/L (ref 135–146)
Total Bilirubin: 0.3 mg/dL (ref 0.2–1.2)
Total Protein: 6.8 g/dL (ref 6.1–8.1)
eGFR: 13 mL/min/{1.73_m2} — ABNORMAL LOW (ref >=60)

## 2021-10-06 LAB — CBC WITH DIFFERENTIAL/PLATELET
Absolute Monocytes: 672 cells/uL (ref 200–950)
Basophils Absolute: 88 cells/uL (ref 0–200)
Basophils Relative: 1.2 %
Eosinophils Absolute: 139 cells/uL (ref 15–500)
Eosinophils Relative: 1.9 %
HCT: 38.8 % (ref 35.0–45.0)
Hemoglobin: 12.8 g/dL (ref 11.7–15.5)
Lymphs Abs: 1657 cells/uL (ref 850–3900)
MCH: 30.1 pg (ref 27.0–33.0)
MCHC: 33 g/dL (ref 32.0–36.0)
MCV: 91.3 fL (ref 80.0–100.0)
MPV: 10.1 fL (ref 7.5–12.5)
Monocytes Relative: 9.2 %
Neutro Abs: 4745 cells/uL (ref 1500–7800)
Neutrophils Relative %: 65 %
Platelets: 298 10*3/uL (ref 140–400)
RBC: 4.25 10*6/uL (ref 3.80–5.10)
RDW: 12.5 % (ref 11.0–15.0)
Total Lymphocyte: 22.7 %
WBC: 7.3 10*3/uL (ref 3.8–10.8)

## 2021-10-07 ENCOUNTER — Inpatient Hospital Stay
Admission: EM | Admit: 2021-10-07 | Discharge: 2021-10-09 | DRG: 683 | Disposition: A | Discharge status: Home Health | Payer: Medicare Other | Attending: Internal Medicine | Admitting: Internal Medicine

## 2021-10-07 ENCOUNTER — Other Ambulatory Visit: Payer: Self-pay

## 2021-10-07 ENCOUNTER — Observation Stay: Payer: Medicare Other

## 2021-10-07 DIAGNOSIS — Z79899 Other long term (current) drug therapy: Secondary | ICD-10-CM

## 2021-10-07 DIAGNOSIS — E669 Obesity, unspecified: Secondary | ICD-10-CM | POA: Diagnosis not present

## 2021-10-07 DIAGNOSIS — E860 Dehydration: Secondary | ICD-10-CM | POA: Diagnosis not present

## 2021-10-07 DIAGNOSIS — J449 Chronic obstructive pulmonary disease, unspecified: Secondary | ICD-10-CM | POA: Diagnosis not present

## 2021-10-07 DIAGNOSIS — N281 Cyst of kidney, acquired: Secondary | ICD-10-CM | POA: Diagnosis not present

## 2021-10-07 DIAGNOSIS — N179 Acute kidney failure, unspecified: Principal | ICD-10-CM | POA: Diagnosis present

## 2021-10-07 DIAGNOSIS — N1831 Chronic kidney disease, stage 3a: Secondary | ICD-10-CM | POA: Diagnosis present

## 2021-10-07 DIAGNOSIS — Z87891 Personal history of nicotine dependence: Secondary | ICD-10-CM

## 2021-10-07 DIAGNOSIS — Z20822 Contact with and (suspected) exposure to covid-19: Secondary | ICD-10-CM | POA: Diagnosis present

## 2021-10-07 DIAGNOSIS — R5381 Other malaise: Secondary | ICD-10-CM | POA: Diagnosis present

## 2021-10-07 DIAGNOSIS — I130 Hypertensive heart and chronic kidney disease with heart failure and stage 1 through stage 4 chronic kidney disease, or unspecified chronic kidney disease: Secondary | ICD-10-CM | POA: Diagnosis present

## 2021-10-07 DIAGNOSIS — Z7951 Long term (current) use of inhaled steroids: Secondary | ICD-10-CM

## 2021-10-07 DIAGNOSIS — L8990 Pressure ulcer of unspecified site, unspecified stage: Secondary | ICD-10-CM | POA: Insufficient documentation

## 2021-10-07 DIAGNOSIS — E785 Hyperlipidemia, unspecified: Secondary | ICD-10-CM | POA: Diagnosis not present

## 2021-10-07 DIAGNOSIS — D509 Iron deficiency anemia, unspecified: Secondary | ICD-10-CM | POA: Diagnosis not present

## 2021-10-07 DIAGNOSIS — L89151 Pressure ulcer of sacral region, stage 1: Secondary | ICD-10-CM | POA: Diagnosis not present

## 2021-10-07 DIAGNOSIS — Z800 Family history of malignant neoplasm of digestive organs: Secondary | ICD-10-CM

## 2021-10-07 DIAGNOSIS — R296 Repeated falls: Secondary | ICD-10-CM | POA: Diagnosis not present

## 2021-10-07 DIAGNOSIS — F1010 Alcohol abuse, uncomplicated: Secondary | ICD-10-CM

## 2021-10-07 DIAGNOSIS — Z79890 Hormone replacement therapy (postmenopausal): Secondary | ICD-10-CM

## 2021-10-07 DIAGNOSIS — K219 Gastro-esophageal reflux disease without esophagitis: Secondary | ICD-10-CM | POA: Diagnosis present

## 2021-10-07 DIAGNOSIS — Z6834 Body mass index (BMI) 34.0-34.9, adult: Secondary | ICD-10-CM

## 2021-10-07 DIAGNOSIS — I10 Essential (primary) hypertension: Secondary | ICD-10-CM | POA: Diagnosis not present

## 2021-10-07 DIAGNOSIS — I5022 Chronic systolic (congestive) heart failure: Secondary | ICD-10-CM | POA: Diagnosis not present

## 2021-10-07 DIAGNOSIS — F1011 Alcohol abuse, in remission: Secondary | ICD-10-CM | POA: Diagnosis present

## 2021-10-07 DIAGNOSIS — R32 Unspecified urinary incontinence: Secondary | ICD-10-CM | POA: Diagnosis present

## 2021-10-07 DIAGNOSIS — Z885 Allergy status to narcotic agent status: Secondary | ICD-10-CM

## 2021-10-07 LAB — CBC
HCT: 36.6 % (ref 36.0–46.0)
Hemoglobin: 11.8 g/dL — ABNORMAL LOW (ref 12.0–15.0)
MCH: 30 pg (ref 26.0–34.0)
MCHC: 32.2 g/dL (ref 30.0–36.0)
MCV: 93.1 fL (ref 80.0–100.0)
Platelets: 221 10*3/uL (ref 150–400)
RBC: 3.93 MIL/uL (ref 3.87–5.11)
RDW: 13.3 % (ref 11.5–15.5)
WBC: 6.4 10*3/uL (ref 4.0–10.5)
nRBC: 0 % (ref 0.0–0.2)

## 2021-10-07 LAB — BASIC METABOLIC PANEL
Anion gap: 9 (ref 5–15)
BUN: 60 mg/dL — ABNORMAL HIGH (ref 8–23)
CO2: 19 mmol/L — ABNORMAL LOW (ref 22–32)
Calcium: 8.4 mg/dL — ABNORMAL LOW (ref 8.9–10.3)
Chloride: 108 mmol/L (ref 98–111)
Creatinine, Ser: 2.91 mg/dL — ABNORMAL HIGH (ref 0.44–1.00)
GFR, Estimated: 16 mL/min — ABNORMAL LOW (ref >60)
Glucose, Bld: 128 mg/dL — ABNORMAL HIGH (ref 70–99)
Potassium: 4.7 mmol/L (ref 3.5–5.1)
Sodium: 136 mmol/L (ref 135–145)

## 2021-10-07 LAB — RESP PANEL BY RT-PCR (FLU A&B, COVID) ARPGX2
Influenza A by PCR: NEGATIVE
Influenza B by PCR: NEGATIVE
SARS Coronavirus 2 by RT PCR: NEGATIVE

## 2021-10-07 LAB — PHOSPHORUS: Phosphorus: 4.5 mg/dL (ref 2.5–4.6)

## 2021-10-07 LAB — MAGNESIUM: Magnesium: 2.5 mg/dL — ABNORMAL HIGH (ref 1.7–2.4)

## 2021-10-07 LAB — BRAIN NATRIURETIC PEPTIDE: B Natriuretic Peptide: 8.5 pg/mL (ref 0.0–100.0)

## 2021-10-07 MED ORDER — TRAZODONE HCL 50 MG PO TABS
50.0000 mg | ORAL_TABLET | Freq: Every day | ORAL | Status: DC
  Administered 2021-10-07 – 2021-10-08 (×2): 50 mg via ORAL
  Filled 2021-10-07 (×2): qty 1

## 2021-10-07 MED ORDER — OMEGA-3-ACID ETHYL ESTERS 1 G PO CAPS
1000.0000 mg | ORAL_CAPSULE | Freq: Every day | ORAL | Status: DC
  Administered 2021-10-07 – 2021-10-09 (×3): 1000 mg via ORAL
  Filled 2021-10-07 (×4): qty 1

## 2021-10-07 MED ORDER — VITAMIN D 25 MCG (1000 UNIT) PO TABS
2000.0000 [IU] | ORAL_TABLET | Freq: Every day | ORAL | Status: DC
  Administered 2021-10-07 – 2021-10-09 (×3): 2000 [IU] via ORAL
  Filled 2021-10-07 (×3): qty 2

## 2021-10-07 MED ORDER — MELATONIN 5 MG PO TABS
5.0000 mg | ORAL_TABLET | Freq: Every day | ORAL | Status: DC
Start: 2021-10-07 — End: 2021-10-09
  Administered 2021-10-07 – 2021-10-08 (×2): 5 mg via ORAL
  Filled 2021-10-07 (×2): qty 1

## 2021-10-07 MED ORDER — DORZOLAMIDE HCL 2 % OP SOLN
1.0000 [drp] | Freq: Two times a day (BID) | OPHTHALMIC | Status: DC
  Administered 2021-10-08 – 2021-10-09 (×3): 1 [drp] via OPHTHALMIC
  Filled 2021-10-07 (×2): qty 10

## 2021-10-07 MED ORDER — SODIUM CHLORIDE 0.9 % IV BOLUS
1000.0000 mL | Freq: Once | INTRAVENOUS | Status: AC
  Administered 2021-10-07: 1000 mL via INTRAVENOUS

## 2021-10-07 MED ORDER — LACTATED RINGERS IV SOLN: INTRAVENOUS | Status: DC

## 2021-10-07 MED ORDER — ACETAMINOPHEN 325 MG PO TABS: 650.0000 mg | ORAL_TABLET | Freq: Four times a day (QID) | ORAL | Status: DC | PRN

## 2021-10-07 MED ORDER — PRAVASTATIN SODIUM 20 MG PO TABS
40.0000 mg | ORAL_TABLET | Freq: Every evening | ORAL | Status: DC
  Administered 2021-10-07 – 2021-10-08 (×2): 40 mg via ORAL
  Filled 2021-10-07 (×2): qty 2

## 2021-10-07 MED ORDER — B COMPLEX-C PO TABS
1.0000 | ORAL_TABLET | Freq: Every day | ORAL | Status: DC
  Administered 2021-10-07 – 2021-10-09 (×3): 1 via ORAL
  Filled 2021-10-07 (×3): qty 1

## 2021-10-07 MED ORDER — UMECLIDINIUM BROMIDE 62.5 MCG/ACT IN AEPB
1.0000 | INHALATION_SPRAY | Freq: Every day | RESPIRATORY_TRACT | Status: DC
  Administered 2021-10-08 – 2021-10-09 (×2): 1 via RESPIRATORY_TRACT
  Filled 2021-10-07 (×2): qty 7

## 2021-10-07 MED ORDER — MAGNESIUM CHLORIDE 64 MG PO TBEC
64.0000 mg | DELAYED_RELEASE_TABLET | Freq: Every day | ORAL | Status: DC
  Administered 2021-10-07 – 2021-10-09 (×3): 64 mg via ORAL
  Filled 2021-10-07 (×3): qty 1

## 2021-10-07 MED ORDER — BUDESON-GLYCOPYRROL-FORMOTEROL 160-9-4.8 MCG/ACT IN AERO: 2.0000 | INHALATION_SPRAY | Freq: Two times a day (BID) | RESPIRATORY_TRACT | Status: DC

## 2021-10-07 MED ORDER — VITAMIN E 45 MG (100 UNIT) PO CAPS
400.0000 [IU] | ORAL_CAPSULE | Freq: Every day | ORAL | Status: DC
  Administered 2021-10-07 – 2021-10-09 (×3): 400 [IU] via ORAL
  Filled 2021-10-07 (×3): qty 4

## 2021-10-07 MED ORDER — LATANOPROST 0.005 % OP SOLN
1.0000 [drp] | Freq: Every day | OPHTHALMIC | Status: DC
  Filled 2021-10-07: qty 2.5

## 2021-10-07 MED ORDER — ADULT MULTIVITAMIN W/MINERALS CH
1.0000 | ORAL_TABLET | Freq: Every day | ORAL | Status: DC
  Administered 2021-10-07 – 2021-10-09 (×3): 1 via ORAL
  Filled 2021-10-07 (×3): qty 1

## 2021-10-07 MED ORDER — ONDANSETRON HCL 4 MG/2ML IJ SOLN: 4.0000 mg | Freq: Three times a day (TID) | INTRAMUSCULAR | Status: DC | PRN

## 2021-10-07 MED ORDER — MOMETASONE FURO-FORMOTEROL FUM 200-5 MCG/ACT IN AERO
2.0000 | INHALATION_SPRAY | Freq: Two times a day (BID) | RESPIRATORY_TRACT | Status: DC
  Administered 2021-10-08 – 2021-10-09 (×3): 2 via RESPIRATORY_TRACT
  Filled 2021-10-07 (×2): qty 8.8

## 2021-10-07 MED ORDER — HEPARIN SODIUM (PORCINE) 5000 UNIT/ML IJ SOLN
5000.0000 [IU] | Freq: Three times a day (TID) | INTRAMUSCULAR | Status: DC
  Administered 2021-10-07 – 2021-10-09 (×5): 5000 [IU] via SUBCUTANEOUS
  Filled 2021-10-07 (×5): qty 1

## 2021-10-07 MED ORDER — IPRATROPIUM-ALBUTEROL 0.5-2.5 (3) MG/3ML IN SOLN: 3.0000 mL | RESPIRATORY_TRACT | Status: DC | PRN

## 2021-10-07 MED ORDER — HYDRALAZINE HCL 20 MG/ML IJ SOLN: 5.0000 mg | INTRAMUSCULAR | Status: DC | PRN

## 2021-10-07 NOTE — H&P (Signed)
History and Physical    Virginia Walters OJJ:[id number] DOB: 09-14-1940 DOA: 10/07/2021  Referring MD/NP/PA:   PCP: Susy Frizzle, MD   Patient coming from:  The patient is coming from home.  At baseline, pt is independent for most of ADL.        Chief Complaint: worsening renal function  HPI: Virginia Walters is a 81 y.o. female with medical history significant of hypertension, hyperlipidemia, COPD, GERD, PFO, retinal artery occlusion, sCHF with EF of 45-50%, memory loss, anemia, CKD-3a, acute cholecystitis s/p perc cholecystotomy drain 09/17/2021, alcohol abuse in remission, who presents with worsening renal function.  Patient was recently hospitalized from 2/2 - 2/7 due to cholecystitis.  Patient is s/p of posterior PERC procedure. She followed up with her primary care provider who checked labs and it was noted that her creatinine was around 3.4 from baseline of 0.9.  She was told to come to the emergency department. Patient notes she has not been drinking as much is normal because she just is not thirsty.  Patient denies any nausea, vomiting, diarrhea or abdominal pain.  No chest pain, cough, shortness breath.  No fever or chills.  Denies symptoms of UTI. She is chronically incontinent of urine.    Data Reviewed and ED Course: pt was found to have worsening renal function with creatinine 2.91 and BUN 60 (creatinine 0.9 on 09/19/2021), WBC 6.4, magnesium 2.5, phosphorus of 4.5, temperature normal, blood pressure 99/59, heart rate 80, RR 20, oxygen saturation 93-97% on room air.  Patient is placed on MedSurg bed for observation.   EKG: I have personally reviewed.  Sinus rhythm, QTc 459, Q wave in lead III/aVF, early R wave progression  Review of Systems:   General: no fevers, chills, no body weight gain, fatigue HEENT: no blurry vision, hearing changes or sore throat Respiratory: no dyspnea, coughing, wheezing CV: no chest pain, no palpitations GI: no nausea, vomiting, abdominal pain,  diarrhea, constipation GU: no dysuria, burning on urination, increased urinary frequency, hematuria  Ext: has trace leg edema Neuro: no unilateral weakness, numbness, or tingling, no vision change or hearing loss Skin: no rash, no skin tear. MSK: No muscle spasm, no deformity, no limitation of range of movement in spin Heme: No easy bruising.  Travel history: No recent long distant travel.   Allergy:  Allergies  Allergen Reactions   Oxycodone Itching    Past Medical History:  Diagnosis Date   CHF (congestive heart failure) (HCC)    Dyslipidemia    Frequent falls    GERD (gastroesophageal reflux disease)    HTN (hypertension)    Memory loss    PFO (patent foramen ovale)    Retinal artery occlusion    Weakness     Past Surgical History:  Procedure Laterality Date   ANKLE ARTHROSCOPY WITH OPEN REDUCTION INTERNAL FIXATION (ORIF) Right    APPENDECTOMY     COLONOSCOPY N/A 07/21/2018   Procedure: COLONOSCOPY;  Surgeon: Lin Landsman, MD;  Location: [facility];  Service: Gastroenterology;  Laterality: N/A;   COLONOSCOPY WITH PROPOFOL N/A 04/01/2020   Procedure: COLONOSCOPY WITH PROPOFOL;  Surgeon: Virgel Manifold, MD;  Location: [facility] ENDOSCOPY;  Service: Endoscopy;  Laterality: N/A;   ESOPHAGOGASTRODUODENOSCOPY N/A 07/21/2018   Procedure: ESOPHAGOGASTRODUODENOSCOPY (EGD);  Surgeon: Lin Landsman, MD;  Location: [facility] ENDOSCOPY;  Service: Gastroenterology;  Laterality: N/A;   ESOPHAGOGASTRODUODENOSCOPY N/A 02/11/2020   Procedure: ESOPHAGOGASTRODUODENOSCOPY (EGD);  Surgeon: Virgel Manifold, MD;  Location: [facility] ENDOSCOPY;  Service: Endoscopy;  Laterality:  N/A;   ESOPHAGOGASTRODUODENOSCOPY (EGD) WITH PROPOFOL N/A 04/01/2020   Procedure: ESOPHAGOGASTRODUODENOSCOPY (EGD) WITH PROPOFOL;  Surgeon: Virgel Manifold, MD;  Location: [facility] ENDOSCOPY;  Service: Endoscopy;  Laterality: N/A;   IR PERC CHOLECYSTOSTOMY  09/17/2021   TUBAL LIGATION      Social History:   reports that she quit smoking about 40 years ago. Her smoking use included cigarettes. She has a 15.00 pack-year smoking history. She has never used smokeless tobacco. She reports current alcohol use. She reports that she does not use drugs.  Family History:  Family History  Problem Relation Age of Onset   Esophageal cancer Father    Dementia Mother      Prior to Admission medications   Medication Sig Start Date End Date Taking? Authorizing Provider  B Complex-Biotin-FA (B-COMPLEX PO) Take 1 capsule by mouth daily.    [provider]  Budeson-Glycopyrrol-Formoterol (BREZTRI AEROSPHERE) 160-9-4.8 MCG/ACT AERO Inhale 2 puffs into the lungs in the morning and at bedtime. 10/05/21   Susy Frizzle, MD  Calcium Carbonate-Vitamin D (CALCIUM + D PO) Take 1 capsule by mouth 2 (two) times daily. Patient not taking: Reported on 10/05/2021    [provider]  Cholecalciferol (VITAMIN D) 2000 UNITS CAPS Take 1 capsule by mouth daily.    [provider]  dorzolamide (TRUSOPT) 2 % ophthalmic solution Place 1 drop into the left eye 2 (two) times daily.    [provider]  doxycycline (VIBRA-TABS) 100 MG tablet Take 100 mg by mouth 2 (two) times daily. Patient not taking: Reported on 10/05/2021 10/02/21 10/09/21  [provider]  feeding supplement (ENSURE SURGERY) LIQD Take 237 mLs by mouth 2 (two) times daily between meals. 09/21/21   Mariel Aloe, MD  ferrous sulfate 325 (65 FE) MG EC tablet Take 1 tablet (325 mg total) by mouth 2 (two) times daily. 02/21/20 09/16/21  Susy Frizzle, MD  fish oil-omega-3 fatty acids 1000 MG capsule Take 1,000 mg by mouth daily.    [provider]  hydrochlorothiazide (HYDRODIURIL) 25 MG tablet Take 1 tablet (25 mg total) by mouth daily. 03/05/20   Susy Frizzle, MD  ipratropium-albuterol (DUONEB) 0.5-2.5 (3) MG/3ML SOLN Take 3 mLs by nebulization every 4 (four) hours as needed (Wheezing or shortness of breath). 09/21/21    Mariel Aloe, MD  latanoprost (XALATAN) 0.005 % ophthalmic solution Place 1 drop into the left eye at bedtime.  12/05/17   [provider]  lisinopril (ZESTRIL) 40 MG tablet TAKE 1 TABLET BY MOUTH  DAILY 06/07/21   Susy Frizzle, MD  magnesium 30 MG tablet Take 30 mg by mouth 2 (two) times daily.    [provider]  melatonin 5 MG TABS Take 5 mg by mouth at bedtime.    [provider]  Multiple Vitamin (MULTIVITAMIN) tablet Take 1 tablet by mouth daily.    [provider]  potassium chloride (KLOR-CON) 10 MEQ tablet TAKE 1 TABLET BY MOUTH  DAILY 11/11/20   Susy Frizzle, MD  pravastatin (PRAVACHOL) 40 MG tablet TAKE 1 TABLET BY MOUTH  DAILY 12/30/20   Susy Frizzle, MD  SYMBICORT 160-4.5 MCG/ACT inhaler USE 2 INHALATIONS BY MOUTH  TWICE DAILY 03/04/20   Susy Frizzle, MD  traZODone (DESYREL) 50 MG tablet TAKE 1 TABLET BY MOUTH AT  BEDTIME 01/28/21   Susy Frizzle, MD  Turmeric 500 MG CAPS Take 500 mg by mouth daily.  Patient not taking: Reported on 10/05/2021  [provider]  venlafaxine XR (EFFEXOR-XR) 75 MG 24 hr capsule TAKE 1 CAPSULE BY MOUTH  DAILY WITH BREAKFAST 04/09/21   Susy Frizzle, MD  verapamil (CALAN-SR) 240 MG CR tablet TAKE 1 TABLET BY MOUTH AT  BEDTIME 07/05/21   Susy Frizzle, MD  vitamin E 180 MG (400 UNITS) capsule Take 400 Units by mouth daily.    [provider]    Physical Exam: Vitals:   10/07/21 0940 10/07/21 0941 10/07/21 1115  BP: (!) 99/58  (!) 99/59  Pulse:  80 72  Resp:  16 20  Temp: 97.6 F (36.4 C)    TempSrc: Oral    SpO2:  93% 97%  Weight:  101.2 kg   Height:  5\' 7"  (1.702 m)    General: Not in acute distress. Dry mucous and membrane HEENT:       Eyes: PERRL, EOMI, no scleral icterus.       ENT: No discharge from the ears and nose, no pharynx injection, no tonsillar enlargement.        Neck: No JVD, no bruit, no mass felt. Heme: No neck lymph node  enlargement. Cardiac: S1/S2, RRR, No murmurs, No gallops or rubs. Respiratory: No rales, wheezing, rhonchi or rubs. GI: Soft, nondistended, nontender, no rebound pain, no organomegaly, BS present. S/p of PERC drainage tube. GU: No hematuria Ext: has trace leg edema bilaterally. 1+DP/PT pulse bilaterally. Musculoskeletal: No joint deformities, No joint redness or warmth, no limitation of ROM in spin. Skin: No rashes.  Neuro: Alert, oriented X3, cranial nerves II-XII grossly intact, moves all extremities normally.  Psych: Patient is not psychotic, no suicidal or hemocidal ideation.  Labs on Admission: I have personally reviewed following labs and imaging studies  CBC: Recent Labs  Lab 10/05/21 1647 10/07/21 0943  WBC 7.3 6.4  NEUTROABS 4,745  --   HGB 12.8 11.8*  HCT 38.8 36.6  MCV 91.3 93.1  PLT 298 175   Basic Metabolic Panel: Recent Labs  Lab 10/05/21 1647 10/07/21 0943  NA 139 136  K 4.9 4.7  CL 104 108  CO2 21 19*  GLUCOSE 148* 128*  BUN 52* 60*  CREATININE 3.38* 2.91*  CALCIUM 9.3 8.4*  MG  --  2.5*  PHOS  --  4.5   GFR: Estimated Creatinine Clearance: 18.8 mL/min (A) (by C-G formula based on SCr of 2.91 mg/dL (H)). Liver Function Tests: Recent Labs  Lab 10/05/21 1647  AST 17  ALT 25  BILITOT 0.3  PROT 6.8   No results for input(s): LIPASE, AMYLASE in the last 168 hours. No results for input(s): AMMONIA in the last 168 hours. Coagulation Profile: No results for input(s): INR, PROTIME in the last 168 hours. Cardiac Enzymes: No results for input(s): CKTOTAL, CKMB, CKMBINDEX, TROPONINI in the last 168 hours. BNP (last 3 results) No results for input(s): PROBNP in the last 8760 hours. HbA1C: No results for input(s): HGBA1C in the last 72 hours. CBG: No results for input(s): GLUCAP in the last 168 hours. Lipid Profile: No results for input(s): CHOL, HDL, LDLCALC, TRIG, CHOLHDL, LDLDIRECT in the last 72 hours. Thyroid Function Tests: No results for  input(s): TSH, T4TOTAL, FREET4, T3FREE, THYROIDAB in the last 72 hours. Anemia Panel: No results for input(s): VITAMINB12, FOLATE, FERRITIN, TIBC, IRON, RETICCTPCT in the last 72 hours. Urine analysis:    Component Value Date/Time   COLORURINE YELLOW 07/22/2021 1852   APPEARANCEUR HAZY (A) 07/22/2021 1852   LABSPEC >1.030 (H) 07/22/2021 1852  PHURINE 6.0 07/22/2021 1852   GLUCOSEU NEGATIVE 07/22/2021 1852   HGBUR NEGATIVE 07/22/2021 1852   BILIRUBINUR NEGATIVE 07/22/2021 [place] 07/22/2021 1852   PROTEINUR 30 (A) 07/22/2021 1852   NITRITE POSITIVE (A) 07/22/2021 1852   LEUKOCYTESUR SMALL (A) 07/22/2021 1852   Sepsis Labs: @LABRCNTIP (procalcitonin:4,lacticidven:4) ) Recent Results (from the past 240 hour(s))  Resp Panel by RT-PCR (Flu A&B, Covid) Nasopharyngeal Swab     Status: None   Collection Time: 10/07/21 10:53 AM   Specimen: Nasopharyngeal Swab; Nasopharyngeal(NP) swabs in vial transport medium  Result Value Ref Range Status   SARS Coronavirus 2 by RT PCR NEGATIVE NEGATIVE Final    Comment: (NOTE) SARS-CoV-2 target nucleic acids are NOT DETECTED.  The SARS-CoV-2 RNA is generally detectable in upper respiratory specimens during the acute phase of infection. The lowest concentration of SARS-CoV-2 viral copies this assay can detect is 138 copies/mL. A negative result does not preclude SARS-Cov-2 infection and should not be used as the sole basis for treatment or other patient management decisions. A negative result may occur with  improper specimen collection/handling, submission of specimen other than nasopharyngeal swab, presence of viral mutation(s) within the areas targeted by this assay, and inadequate number of viral copies(<138 copies/mL). A negative result must be combined with clinical observations, patient history, and epidemiological information. The expected result is Negative.  Fact Sheet for Patients:   EntrepreneurPulse.com.au  Fact Sheet for Healthcare Providers:  IncredibleEmployment.be  This test is no t yet approved or cleared by the [place] FDA and  has been authorized for detection and/or diagnosis of SARS-CoV-2 by FDA under an Emergency Use Authorization (EUA). This EUA will remain  in effect (meaning this test can be used) for the duration of the COVID-19 declaration under Section 564(b)(1) of the Act, 21 U.S.C.section 360bbb-3(b)(1), unless the authorization is terminated  or revoked sooner.       Influenza A by PCR NEGATIVE NEGATIVE Final   Influenza B by PCR NEGATIVE NEGATIVE Final    Comment: (NOTE) The Xpert Xpress SARS-CoV-2/FLU/RSV plus assay is intended as an aid in the diagnosis of influenza from Nasopharyngeal swab specimens and should not be used as a sole basis for treatment. Nasal washings and aspirates are unacceptable for Xpert Xpress SARS-CoV-2/FLU/RSV testing.  Fact Sheet for Patients: EntrepreneurPulse.com.au  Fact Sheet for Healthcare Providers: IncredibleEmployment.be  This test is not yet approved or cleared by the [place] FDA and has been authorized for detection and/or diagnosis of SARS-CoV-2 by FDA under an Emergency Use Authorization (EUA). This EUA will remain in effect (meaning this test can be used) for the duration of the COVID-19 declaration under Section 564(b)(1) of the Act, 21 U.S.C. section 360bbb-3(b)(1), unless the authorization is terminated or revoked.  Performed at [facility], [address], [place], [place] [address]      Radiological Exams on Admission: No results found.    Assessment/Plan Principal Problem:   Acute renal failure superimposed on stage 3a chronic kidney disease (HCC) Active Problems:   HTN (hypertension)   Dyslipidemia   Chronic systolic heart failure (HCC)   Iron deficiency anemia   COPD (chronic  obstructive pulmonary disease) (HCC)  Acute renal failure superimposed on stage 3a chronic kidney disease ([place]): Baseline creatinine 0.9 on 09/19/2021.  Her creatinine is at 2.91, BUN 60 today, GFR 16. Patient recently had s/p of PERC procedure, did not drink fluids as normally she dose.   Possibly due to dehydration and continuation of lisinopril.  -  will place in med-surg bed for obs -hold lisinopril and HCTZ. -IV fluid: 1 L normal saline, followed by 75 cc/h of LR -Check FeUrea -f/u US-renal -f/u UA  HTN (hypertension): bp 99/59, soft -Hold blood pressure medications: HCTZ, lisinopril, verapamil -IV hydralazine as needed  Dyslipidemia -Pravastatin  Chronic systolic heart failure ([place]): 2D echo on 07/31/2018 showed EF 45-50%.  Patient has trace leg edema, no shortness of breath, no JVD.  Does not seem to have CHF exacerbation. -Hold HCTZ -Watch volume status closely -Check BNP while patient is receiving IV fluids  Iron deficiency anemia: Hgb 12.8 on 10/05/21 --> 11.8. no active bleeding -Continue iron supplement  COPD (chronic obstructive pulmonary disease) ([place]): Stable -Bronchodilators         DVT ppx: SQ Heparin      Code Status: Full code  Family Communication: not done, no family member is at bed side.  I called her daughter who did not pick up the phone, I left a message to her.  Disposition Plan:  Anticipate discharge back to previous environment  Consults called:  none  Admission status and Level of care: Med-Surg:  for obs    Severity of Illness:  The appropriate patient status for this patient is OBSERVATION. Observation status is judged to be reasonable and necessary in order to provide the required intensity of service to ensure the patient's safety. The patient's presenting symptoms, physical exam findings, and initial radiographic and laboratory data in the context of their medical condition is felt to place them at decreased risk for further clinical  deterioration. Furthermore, it is anticipated that the patient will be medically stable for discharge from the hospital within 2 midnights of admission.        Date of Service 10/07/2021    Ivor Costa [facility]   If 7PM-7AM, please contact night-coverage [email or web address] 10/07/2021, 12:43 PM

## 2021-10-07 NOTE — ED Notes (Signed)
Dietary called for lunch tray at this time.

## 2021-10-07 NOTE — ED Notes (Signed)
Pt to ED at this time for increased kidney levels- sent by PCP. No complaints at this time.

## 2021-10-07 NOTE — ED Provider Notes (Addendum)
[facility] Provider Note    Event Date/Time   First MD Initiated Contact with Patient 10/07/21 1036     (approximate)   History   Kidney Issues   HPI  Virginia Walters is a 81 y.o. female with past medical history of hypertension, CKD, hyperlipidemia, systolic heart failure EF of 45%, recent cholecystitis status post [facility] chole who presents for evaluation of elevated creatinine.  Patient was recently admitted to the hospital and discharged after [facility] on 2/8.  She went to rehab facility has been home for the last approximately week.  She followed up with her primary care provider who checked labs and it was noted that her creatinine was around 3.4 from baseline of 0.9.  She was told to come to the emergency department.  Patient notes she has not been drinking as much is normal because she just is not thirsty.  She is accompanied by a friend who also cares for her says that since Sunday she is only had 2 bottles of water.  Still eating normally.  She felt lightheaded yesterday but denies any ongoing symptoms.  Denies nausea vomiting diarrhea or other GI losses.  She is chronically incontinent of urine.  Does endorse decreased urine output.  Denies abdominal pain.  No shortness of breath or chest pain.     Past Medical History:  Diagnosis Date   CHF (congestive heart failure) (HCC)    Dyslipidemia    Frequent falls    GERD (gastroesophageal reflux disease)    HTN (hypertension)    Memory loss    PFO (patent foramen ovale)    Retinal artery occlusion    Weakness     Patient Active Problem List   Diagnosis Date Noted   Acute on chronic respiratory failure with hypoxia ([place]) 09/21/2021   Insomnia 09/18/2021   Acute cholecystitis s/p perc cholecystotomy drain 09/17/2021 09/16/2021   Chronic kidney disease, stage 3a ([place]) 09/16/2021   Iron deficiency anemia    Gastric polyp    Stomach irritation    Ectopic gastric mucosa    Polyp of colon     Angiodysplasia of intestinal tract    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, stage 3a ([place]) 02/10/2020   COPD exacerbation ([place]) 05/39/7673   Chronic systolic heart failure ([place]) 08/29/2018   Acute on chronic heart failure with preserved ejection fraction (HCC)    Symptomatic anemia 07/20/2018   Dementia with behavioral problem 10/26/2016   Alcohol abuse 10/26/2016   Memory loss 10/12/2016   Gait abnormality 10/12/2016   Orthostatic dizziness 10/12/2016   Paresthesia 10/12/2016   Abnormal EKG 12/28/2010   HTN (hypertension)    Dyslipidemia    PFO (patent foramen ovale)    Retinal artery occlusion    GERD (gastroesophageal reflux disease)      Physical Exam  Triage Vital Signs: ED Triage Vitals  Enc Vitals Group     BP 10/07/21 0940 (!) 99/58     Pulse Rate 10/07/21 0941 80     Resp 10/07/21 0941 16     Temp 10/07/21 0940 97.6 F (36.4 C)     Temp Source 10/07/21 0940 Oral     SpO2 10/07/21 0941 93 %     Weight 10/07/21 0941 223 lb (101.2 kg)     Height 10/07/21 0941 5\' 7"  (1.702 m)     Head Circumference --      Peak Flow --  Pain Score 10/07/21 0941 0     Pain Loc --      Pain Edu? --      Excl. in [place]? --     Most recent vital signs: Vitals:   10/07/21 0940 10/07/21 0941  BP: (!) 99/58   Pulse:  80  Resp:  16  Temp: 97.6 F (36.4 C)   SpO2:  93%     General: Awake, no distress.  CV:  Good peripheral perfusion.  Resp:  Normal effort.  Abd:  No distention.  Neuro:             Awake, Alert, Oriented x 3  Other:     ED Results / Procedures / Treatments  Labs (all labs ordered are listed, but only abnormal results are displayed) Labs Reviewed  CBC - Abnormal; Notable for the following components:      Result Value   Hemoglobin 11.8 (*)    All other components within normal limits  BASIC METABOLIC PANEL - Abnormal; Notable for the following components:   CO2 19 (*)    Glucose, Bld 128 (*)    BUN  60 (*)    Creatinine, Ser 2.91 (*)    Calcium 8.4 (*)    GFR, Estimated 16 (*)    All other components within normal limits  RESP PANEL BY RT-PCR (FLU A&B, COVID) ARPGX2  URINALYSIS, ROUTINE W REFLEX MICROSCOPIC  MAGNESIUM  PHOSPHORUS  SODIUM, URINE, RANDOM     EKG     RADIOLOGY    PROCEDURES:  Critical Care performed: No  Procedures  The patient is on the cardiac monitor to evaluate for evidence of arrhythmia and/or significant heart rate changes.   MEDICATIONS ORDERED IN ED: Medications  sodium chloride 0.9 % bolus 1,000 mL (has no administration in time range)     IMPRESSION / MDM / ASSESSMENT AND PLAN / ED COURSE  I reviewed the triage vital signs and the nursing notes.                              Differential diagnosis includes, but is not limited to, prerenal AKI, intrinsic AKI, interstitial nephritis, medication side effect  Patient is a 81 year old female recently hospitalized for cholecystitis status post [place] who presents with AKI seen on outpatient labs.  Her baseline creatinine is around 1 was 0.9 when she left the hospital last on 2/5.  BMP drawn by her PCP 2 days ago with creatinine of 3.4.  Patient is relatively asymptomatic.  Her blood pressure is borderline low, review of blood pressures during her hospitalization she is usually hypertensive with systolics around 409B, today systolic in the 35H.  She is not lightheaded dizzy.  She denies any GI losses.  Her friend is with her does note that she has not been drinking as much and she does say that she has not peed as much.  She is chronically incontinent of urine denies difficulty emptying her bladder my suspicion for post renal AKI is low.  Today her creatinine is 2.9 which is somewhat improved from 2 days ago.  BUN elevated at 60.  Bicarb also low at 19.  Her potassium is normal.  We will send mag and Phos.  I suspect that the patient is somewhat dry and this is most likely the cause of her AKI.   Also considering interstitial nephritis or ATN from antibiotics during hospitalization.  She has completed a course of  Augmentin finished about 10 days ago.  Given her blood pressure and AKI do feel that she warrants observation in the hospital.  We will give her a liter of fluid.  We will send urine lites and UA.  Discussed with the hospitalist for admission.        FINAL CLINICAL IMPRESSION(S) / ED DIAGNOSES   Final diagnoses:  AKI (acute kidney injury) ([place])     Rx / DC Orders   ED Discharge Orders     None        Note:  This document was prepared using Dragon voice recognition software and may include unintentional dictation errors.   Rada Hay, MD 10/07/21 1101    Rada Hay, MD 10/07/21 [phone]

## 2021-10-07 NOTE — ED Notes (Signed)
Purewick placed at this time.

## 2021-10-07 NOTE — ED Triage Notes (Signed)
Pt here with kidney issues and was told to come to the ED by her provider. Pt currently has a kidney stent in place. Pt was just released from [facility] Pt in NAD on arrival to ED.

## 2021-10-08 DIAGNOSIS — Z800 Family history of malignant neoplasm of digestive organs: Secondary | ICD-10-CM | POA: Diagnosis not present

## 2021-10-08 DIAGNOSIS — R32 Unspecified urinary incontinence: Secondary | ICD-10-CM | POA: Diagnosis present

## 2021-10-08 DIAGNOSIS — J449 Chronic obstructive pulmonary disease, unspecified: Secondary | ICD-10-CM | POA: Diagnosis present

## 2021-10-08 DIAGNOSIS — N179 Acute kidney failure, unspecified: Secondary | ICD-10-CM | POA: Diagnosis present

## 2021-10-08 DIAGNOSIS — N1831 Chronic kidney disease, stage 3a: Secondary | ICD-10-CM | POA: Diagnosis not present

## 2021-10-08 DIAGNOSIS — Z885 Allergy status to narcotic agent status: Secondary | ICD-10-CM | POA: Diagnosis not present

## 2021-10-08 DIAGNOSIS — I10 Essential (primary) hypertension: Secondary | ICD-10-CM | POA: Diagnosis not present

## 2021-10-08 DIAGNOSIS — F1011 Alcohol abuse, in remission: Secondary | ICD-10-CM | POA: Diagnosis present

## 2021-10-08 DIAGNOSIS — E860 Dehydration: Secondary | ICD-10-CM | POA: Diagnosis present

## 2021-10-08 DIAGNOSIS — D509 Iron deficiency anemia, unspecified: Secondary | ICD-10-CM | POA: Diagnosis present

## 2021-10-08 DIAGNOSIS — Z87891 Personal history of nicotine dependence: Secondary | ICD-10-CM | POA: Diagnosis not present

## 2021-10-08 DIAGNOSIS — R296 Repeated falls: Secondary | ICD-10-CM | POA: Diagnosis present

## 2021-10-08 DIAGNOSIS — L89151 Pressure ulcer of sacral region, stage 1: Secondary | ICD-10-CM | POA: Diagnosis present

## 2021-10-08 DIAGNOSIS — L8990 Pressure ulcer of unspecified site, unspecified stage: Secondary | ICD-10-CM | POA: Insufficient documentation

## 2021-10-08 DIAGNOSIS — K219 Gastro-esophageal reflux disease without esophagitis: Secondary | ICD-10-CM | POA: Diagnosis present

## 2021-10-08 DIAGNOSIS — Z20822 Contact with and (suspected) exposure to covid-19: Secondary | ICD-10-CM | POA: Diagnosis present

## 2021-10-08 DIAGNOSIS — E785 Hyperlipidemia, unspecified: Secondary | ICD-10-CM | POA: Diagnosis present

## 2021-10-08 DIAGNOSIS — I130 Hypertensive heart and chronic kidney disease with heart failure and stage 1 through stage 4 chronic kidney disease, or unspecified chronic kidney disease: Secondary | ICD-10-CM | POA: Diagnosis present

## 2021-10-08 DIAGNOSIS — Z6834 Body mass index (BMI) 34.0-34.9, adult: Secondary | ICD-10-CM | POA: Diagnosis not present

## 2021-10-08 DIAGNOSIS — E669 Obesity, unspecified: Secondary | ICD-10-CM

## 2021-10-08 DIAGNOSIS — Z7951 Long term (current) use of inhaled steroids: Secondary | ICD-10-CM | POA: Diagnosis not present

## 2021-10-08 DIAGNOSIS — Z79890 Hormone replacement therapy (postmenopausal): Secondary | ICD-10-CM | POA: Diagnosis not present

## 2021-10-08 DIAGNOSIS — Z79899 Other long term (current) drug therapy: Secondary | ICD-10-CM | POA: Diagnosis not present

## 2021-10-08 DIAGNOSIS — I5022 Chronic systolic (congestive) heart failure: Secondary | ICD-10-CM | POA: Diagnosis not present

## 2021-10-08 DIAGNOSIS — R5381 Other malaise: Secondary | ICD-10-CM | POA: Diagnosis present

## 2021-10-08 LAB — BASIC METABOLIC PANEL
Anion gap: 6 (ref 5–15)
BUN: 56 mg/dL — ABNORMAL HIGH (ref 8–23)
CO2: 23 mmol/L (ref 22–32)
Calcium: 8.2 mg/dL — ABNORMAL LOW (ref 8.9–10.3)
Chloride: 109 mmol/L (ref 98–111)
Creatinine, Ser: 1.97 mg/dL — ABNORMAL HIGH (ref 0.44–1.00)
GFR, Estimated: 25 mL/min — ABNORMAL LOW (ref >60)
Glucose, Bld: 93 mg/dL (ref 70–99)
Potassium: 4.8 mmol/L (ref 3.5–5.1)
Sodium: 138 mmol/L (ref 135–145)

## 2021-10-08 LAB — URINALYSIS, ROUTINE W REFLEX MICROSCOPIC
Bilirubin Urine: NEGATIVE
Glucose, UA: NEGATIVE mg/dL
Hgb urine dipstick: NEGATIVE
Ketones, ur: NEGATIVE mg/dL
Nitrite: NEGATIVE
Protein, ur: NEGATIVE mg/dL
Specific Gravity, Urine: 1.011 (ref 1.005–1.030)
WBC, UA: >50 WBC/hpf — ABNORMAL HIGH (ref 0–5)
pH: 5 (ref 5.0–8.0)

## 2021-10-08 LAB — CREATININE, URINE, RANDOM: Creatinine, Urine: 81 mg/dL

## 2021-10-08 MED ORDER — SODIUM CHLORIDE 0.9 % IV SOLN
INTRAVENOUS | Status: DC
Start: 2021-10-08 — End: 2021-10-09

## 2021-10-08 MED ORDER — ALUM & MAG HYDROXIDE-SIMETH 200-200-20 MG/5ML PO SUSP
15.0000 mL | ORAL | Status: DC | PRN
  Administered 2021-10-08 (×2): 15 mL via ORAL
  Filled 2021-10-08 (×3): qty 30

## 2021-10-08 NOTE — Evaluation (Signed)
Occupational Therapy Evaluation Patient Details Name: Virginia Walters MRN: [id number] DOB: [birth year] Today's Date: 10/08/2021   History of Present Illness Pt is an 81 y.o. female who presents to the ED from her PCP due to elevated creatinine. She was recently admitted on 2/2 to [facility] for abdominal pani & acute chyoleystitis. PmHx: CKD, HTN, HLD, CHF, recent cholecystitis, GERD.   Clinical Impression   Ms Wease was seen for OT evaluation this date. Prior to hospital admission, pt was MOD I for mobility and ADLs using RW. Pt lives alone with neighbour available PRN. Pt presents to acute OT demonstrating impaired ADL performance and functional mobility 2/2 decreased activity tolerance and functional strength/ROM/balance deficits. Upon arrival pt requesting to return to bed, RN at bedside, pt refusing further activity. Pt currently requires CGA for ADL t/f - assist for lines mgmt. MOD I doff B socks at bed level. Completed x2 sit<>stand with no AD, fair standing balance. Pt would benefit from skilled OT to address noted impairments and functional limitations (see below for any additional details). Upon hospital discharge, recommend HHOT to maximize pt safety and return to PLOF.     Recommendations for follow up therapy are one component of a multi-disciplinary discharge planning process, led by the attending physician.  Recommendations may be updated based on patient status, additional functional criteria and insurance authorization.   Follow Up Recommendations  Home health OT    Assistance Recommended at Discharge Intermittent Supervision/Assistance  Patient can return home with the following A little help with walking and/or transfers;A little help with bathing/dressing/bathroom;Help with stairs or ramp for entrance    Functional Status Assessment  Patient has had a recent decline in their functional status and demonstrates the ability to make significant  improvements in function in a reasonable and predictable amount of time.  Equipment Recommendations  BSC/3in1    Recommendations for Other Services       Precautions / Restrictions Precautions Precautions: Fall Restrictions Weight Bearing Restrictions: No      Mobility Bed Mobility Overal bed mobility: Needs Assistance Bed Mobility: Sit to Supine       Sit to supine: Supervision        Transfers Overall transfer level: Needs assistance Equipment used: None Transfers: Sit to/from Stand, Bed to chair/wheelchair/BSC Sit to Stand: Min guard Stand pivot transfers: Min guard                Balance Overall balance assessment: Needs assistance Sitting-balance support: Single extremity supported, No upper extremity supported, Feet supported Sitting balance-Leahy Scale: Good     Standing balance support: Single extremity supported, During functional activity Standing balance-Leahy Scale: Fair                             ADL either performed or assessed with clinical judgement   ADL Overall ADL's : Needs assistance/impaired                                       General ADL Comments: CGA for ADL t/f - assist for lines mgmt. MOD I doff B socks at bed level      Pertinent Vitals/Pain Pain Assessment Pain Assessment: No/denies pain     Hand Dominance Right   Extremity/Trunk Assessment Upper Extremity Assessment Upper Extremity Assessment: Overall WFL for tasks assessed   Lower Extremity Assessment Lower  Extremity Assessment: Overall WFL for tasks assessed       Communication Communication Communication: No difficulties   Cognition Arousal/Alertness: Awake/alert Behavior During Therapy: WFL for tasks assessed/performed Overall Cognitive Status: Within Functional Limits for tasks assessed                                        Home Living Family/patient expects to be discharged to:: Private  residence Living Arrangements: Alone Available Help at Discharge: Neighbor;Available PRN/intermittently Type of Home: House Home Access: Stairs to enter [employer] of Steps: 6 Entrance Stairs-Rails: Right Home Layout: One level     Bathroom Shower/Tub: Occupational psychologist: Standard     Home Equipment: Advice worker (2 wheels)   Additional Comments: has cousin/neighbor that assists her as needed      Prior Functioning/Environment Prior Level of Function : Independent/Modified Independent             Mobility Comments: ambulates with walker ADLs Comments: I with BADLs, neighbors assist with transportation        OT Problem List: Decreased strength;Decreased activity tolerance;Impaired balance (sitting and/or standing);Decreased cognition;Decreased safety awareness;Decreased range of motion      OT Treatment/Interventions: Self-care/ADL training;Therapeutic exercise;Therapeutic activities;Patient/family education;Balance training;Cognitive remediation/compensation;DME and/or AE instruction    OT Goals(Current goals can be found in the care plan section) Acute Rehab OT Goals Patient Stated Goal: to go home OT Goal Formulation: With patient Time For Goal Achievement: 10/22/21 Potential to Achieve Goals: Good ADL Goals Pt Will Perform Grooming: with modified independence;standing Pt Will Transfer to Toilet: with modified independence;ambulating;regular height toilet Pt Will Perform Toileting - Clothing Manipulation and hygiene: with modified independence;sitting/lateral leans;sit to/from stand  OT Frequency: Min 2X/week    Co-evaluation              AM-PAC OT "6 Clicks" Daily Activity     Outcome Measure Help from another person eating meals?: None Help from another person taking care of personal grooming?: A Little Help from another person toileting, which includes using toliet, bedpan, or urinal?: A Little Help from  another person bathing (including washing, rinsing, drying)?: A Little Help from another person to put on and taking off regular upper body clothing?: None Help from another person to put on and taking off regular lower body clothing?: A Little 6 Click Score: 20   End of Session Nurse Communication: Mobility status  Activity Tolerance: Patient tolerated treatment well Patient left: in bed;with call bell/phone within reach;with bed alarm set  OT Visit Diagnosis: Other abnormalities of gait and mobility (R26.89);Muscle weakness (generalized) (M62.81)                Time: 8891-6945 OT Time Calculation (min): 13 min Charges:  OT General Charges $OT Visit: 1 Visit OT Evaluation $OT Eval Low Complexity: 1 Low  Dessie Coma, M.S. OTR/L  10/08/21, 2:35 PM  ascom [phone]

## 2021-10-08 NOTE — Evaluation (Signed)
Physical Therapy Evaluation Patient Details Name: SHAUNTELLE JAMERSON MRN: [id number] DOB: 05/03/1941 Today's Date: 10/08/2021  History of Present Illness  Pt is an 81 y.o. female who presents to the ED from her PCP due to elevated creatinine. She was recently admitted on 2/2 to [facility] for abdominal pani & acute chyoleystitis. PmHx: CKD, HTN, HLD, CHF, recent cholecystitis, GERD.   Clinical Impression  Pt awake, resting in bed, upon PT entrance into room today for evaluation. Pt denies any c/o pain currently at rest and is A&Ox4. Pt reports she lives alone in her 1-story home w/ 6 steps to enter. She is modI w/ ADLs; requires assistance from her neighbor and/or cousin for grocery/transportation needs; she reports she uses a RW at baseline.  Pt is able to perform bed mobility w/ SUPERVISION and HOB elevated. Once seated EOB, she is able to perform sit to stand w/ CGA using RW to step-pivot transfer in order to don hospital brief. Pt is able to tolerate static standing for ~86min while I adjusted brief prior to ambulation. She was able to ambulate to door and back to recliner ~70ft before increase c/o of fatigue. Pt will benefit from continued skilled PT in order to improve LE strength, mobility, gait, and restore PLOF. Current discharge recommendation to HHPT is appropriate due to the level of assistance required by the patient to ensure safety and improve overall function.      Recommendations for follow up therapy are one component of a multi-disciplinary discharge planning process, led by the attending physician.  Recommendations may be updated based on patient status, additional functional criteria and insurance authorization.  Follow Up Recommendations Home health PT    Assistance Recommended at Discharge PRN  Patient can return home with the following  A little help with walking and/or transfers;Help with stairs or ramp for entrance;A little help with  bathing/dressing/bathroom;Assistance with cooking/housework;Assist for transportation    Equipment Recommendations None recommended by PT  Recommendations for Other Services       Functional Status Assessment Patient has had a recent decline in their functional status and demonstrates the ability to make significant improvements in function in a reasonable and predictable amount of time.     Precautions / Restrictions Precautions Precautions: Fall Restrictions Weight Bearing Restrictions: No      Mobility  Bed Mobility Overal bed mobility: Needs Assistance Bed Mobility: Supine to Sit     Supine to sit: Supervision          Transfers Overall transfer level: Needs assistance Equipment used: Rolling walker (2 wheels) Transfers: Sit to/from Stand, Bed to chair/wheelchair/BSC Sit to Stand: Min guard   Step pivot transfers: Min guard       General transfer comment: cues for hand placement, increased time and effort    Ambulation/Gait Ambulation/Gait assistance: Min guard Gait Distance (Feet): 30 Feet Assistive device: Rolling walker (2 wheels) Gait Pattern/deviations: Step-through pattern, Decreased stride length, Trunk flexed, Decreased step length - right, Decreased step length - left          Stairs            Wheelchair Mobility    Modified Rankin (Stroke Patients Only)       Balance Overall balance assessment: Needs assistance Sitting-balance support: Single extremity supported, No upper extremity supported, Feet supported Sitting balance-Leahy Scale: Good     Standing balance support: Bilateral upper extremity supported, During functional activity, Reliant on assistive device for balance Standing balance-Leahy Scale: Fair  Pertinent Vitals/Pain Pain Assessment Pain Assessment: No/denies pain    Home Living Family/patient expects to be discharged to:: Private residence Living Arrangements:  Alone Available Help at Discharge: Neighbor;Available PRN/intermittently Type of Home: House Home Access: Stairs to enter Entrance Stairs-Rails: Right Entrance Stairs-Number of Steps: 6   Home Layout: One level Home Equipment: Advice worker (2 wheels) Additional Comments: has cousin/neighbor that assists her as needed    Prior Function Prior Level of Function : Independent/Modified Independent             Mobility Comments: ambulates with walker ADLs Comments: I with BADLs, neighbors assist with transportation     Hand Dominance        Extremity/Trunk Assessment   Upper Extremity Assessment Upper Extremity Assessment: Overall WFL for tasks assessed    Lower Extremity Assessment Lower Extremity Assessment: Overall WFL for tasks assessed       Communication      Cognition Arousal/Alertness: Awake/alert Behavior During Therapy: WFL for tasks assessed/performed Overall Cognitive Status: Within Functional Limits for tasks assessed                                          General Comments      Exercises     Assessment/Plan    PT Assessment Patient needs continued PT services  PT Problem List Decreased strength;Decreased activity tolerance;Decreased balance;Decreased mobility;Cardiopulmonary status limiting activity       PT Treatment Interventions DME instruction;Therapeutic exercise;Gait training;Functional mobility training;Therapeutic activities;Stair training    PT Goals (Current goals can be found in the Care Plan section)  Acute Rehab PT Goals Patient Stated Goal: to get better/stronger to go home PT Goal Formulation: With patient Time For Goal Achievement: 10/22/21 Potential to Achieve Goals: Good    Frequency Min 2X/week     Co-evaluation               AM-PAC PT "6 Clicks" Mobility  Outcome Measure Help needed turning from your back to your side while in a flat bed without using bedrails?: A Little Help  needed moving from lying on your back to sitting on the side of a flat bed without using bedrails?: A Little Help needed moving to and from a bed to a chair (including a wheelchair)?: A Little Help needed standing up from a chair using your arms (e.g., wheelchair or bedside chair)?: A Little Help needed to walk in hospital room?: A Little Help needed climbing 3-5 steps with a railing? : A Lot 6 Click Score: 17    End of Session Equipment Utilized During Treatment: Gait belt Activity Tolerance: Patient tolerated treatment well;Patient limited by fatigue Patient left: in chair;with chair alarm set;with call bell/phone within reach Nurse Communication: Mobility status PT Visit Diagnosis: Other abnormalities of gait and mobility (R26.89);Difficulty in walking, not elsewhere classified (R26.2);Muscle weakness (generalized) (M62.81)    Time: 8182-9937 PT Time Calculation (min) (ACUTE ONLY): 29 min   Charges:             Jonnie Kind, SPT 10/08/2021, 1:02 PM

## 2021-10-08 NOTE — Progress Notes (Addendum)
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  Recently had PERC cholecystotomy procedure, did not drink fluids as normally she dose. Possibly due to dehydration and continuation of lisinopril. Continue on IVFs. Hold lisinopril, HCTZ   HTN: hold lisinopril, HCTZ, verapamil as BP low normal    Dyslipidemia: continue on statin    Chronic systolic CHF: echo on 93/81/8299 showed EF 45-50%. Does not seem to have CHF exacerbation. Monitor I/Os.    IDA: continue on iron supplement    COPD : w/o exacerbation. Continue on bronchodilators  Obesity: BMI 34/9. Complicates overall care & prognosis     DVT prophylaxis: heparin  Code Status: full Family Communication: discussed pt's care w/ pt's daughter, Anderson Malta and answered her questions Disposition Plan: depends on PT/OT recs  Level of care: Med-Surg  Status is: Inpatient Remains inpatient appropriate because: severity of illness, Cr is still elevated and on IVFs     Consultants:    Procedures:   Antimicrobials:   Subjective: Pt c/o malaise   Objective: Vitals:   10/07/21 2123 10/07/21 2141 10/08/21 0452 10/08/21 0841  BP: 125/72 (!) 156/64 (!) 152/53 (!) 119/59  Pulse: 75 75 78 75  Resp: 20 19 18 17   Temp: 97.9 F (36.6 C) 98 F (36.7 C) 97.7 F (36.5 C) 98 F (36.7 C)  TempSrc: Oral Oral Oral   SpO2: 97% 100% 99% 98%  Weight:      Height:        Intake/Output Summary (Last 24 hours) at 10/08/2021 1609 Last data filed at 10/08/2021 1500 Gross per 24  hour  Intake 470.86 ml  Output 710 ml  Net -239.14 ml   Filed Weights   10/07/21 0941  Weight: 101.2 kg    Examination:  General exam: Appears calm and comfortable  Respiratory system: Clear to auscultation. Respiratory effort normal. Cardiovascular system: S1 & S2 +. No rubs, gallops or clicks.. Gastrointestinal system: Abdomen is nondistended, soft and nontender. Normal bowel sounds heard. Central nervous system: Alert and oriented. Moves all extremities  Psychiatry: Judgement and insight appear normal. Mood & affect appropriate.     Data Reviewed: I have personally reviewed following labs and imaging studies  CBC: Recent Labs  Lab 10/05/21 1647 10/07/21 0943  WBC 7.3 6.4  NEUTROABS 4,745  --   HGB 12.8 11.8*  HCT 38.8 36.6  MCV 91.3 93.1  PLT 298 371   Basic Metabolic Panel: Recent Labs  Lab 10/05/21 1647 10/07/21 0943 10/08/21 0437  NA 139 136 138  K 4.9 4.7 4.8  CL 104 108 109  CO2 21 19* 23  GLUCOSE 148* 128* 93  BUN 52* 60* 56*  CREATININE 3.38* 2.91* 1.97*  CALCIUM 9.3 8.4* 8.2*  MG  --  2.5*  --   PHOS  --  4.5  --    GFR: Estimated Creatinine Clearance: 27.8 mL/min (A) (by C-G formula based on SCr of 1.97 mg/dL (H)). Liver Function Tests: Recent Labs  Lab 10/05/21 1647  AST 17  ALT 25  BILITOT 0.3  PROT 6.8   No results for input(s): LIPASE, AMYLASE in the last 168 hours. No results for input(s): AMMONIA in the last 168 hours. Coagulation Profile: No results for input(s): INR, PROTIME in the last 168 hours. Cardiac Enzymes: No results for input(s): CKTOTAL, CKMB, CKMBINDEX, TROPONINI in the last 168 hours. BNP (last 3 results) No results for input(s): PROBNP in the last 8760 hours. HbA1C: No results for input(s): HGBA1C in the last 72 hours. CBG: No results for input(s): GLUCAP in the last 168 hours. Lipid Profile: No results for input(s): CHOL, HDL, LDLCALC, TRIG, CHOLHDL, LDLDIRECT in the last 72 hours. Thyroid Function  Tests: No results for input(s): TSH, T4TOTAL, FREET4, T3FREE, THYROIDAB in the last 72 hours. Anemia Panel: No results for input(s): VITAMINB12, FOLATE, FERRITIN, TIBC, IRON, RETICCTPCT in the last 72 hours. Sepsis Labs: No results for input(s): PROCALCITON, LATICACIDVEN in the last 168 hours.  Recent Results (from the past 240 hour(s))  Resp Panel by RT-PCR (Flu A&B, Covid) Nasopharyngeal Swab     Status: None   Collection Time: 10/07/21 10:53 AM   Specimen: Nasopharyngeal Swab; Nasopharyngeal(NP) swabs in vial transport medium  Result Value Ref Range Status   SARS Coronavirus 2 by RT PCR NEGATIVE NEGATIVE Final    Comment: (NOTE) SARS-CoV-2 target nucleic acids are NOT DETECTED.  The SARS-CoV-2 RNA is generally detectable in upper respiratory specimens during the acute phase of infection. The lowest concentration of SARS-CoV-2 viral copies this assay can detect is 138 copies/mL. A negative result does not preclude SARS-Cov-2 infection and should not be used as the sole basis for treatment or other patient management decisions. A negative result may occur with  improper specimen collection/handling, submission of specimen other than nasopharyngeal swab, presence of viral mutation(s) within the areas targeted by this assay, and inadequate number of viral copies(<138 copies/mL). A negative result must be combined with clinical observations, patient history, and epidemiological information. The expected result is Negative.  Fact Sheet for Patients:  EntrepreneurPulse.com.au  Fact Sheet for Healthcare Providers:  IncredibleEmployment.be  This test is no t yet approved or cleared by the [place] FDA and  has been authorized for detection and/or diagnosis of SARS-CoV-2 by FDA under an Emergency Use Authorization (EUA). This EUA will remain  in effect (meaning this test can be used) for the duration of the COVID-19 declaration under Section  564(b)(1) of the Act, 21 U.S.C.section 360bbb-3(b)(1), unless the authorization is terminated  or revoked sooner.       Influenza A by PCR NEGATIVE NEGATIVE Final   Influenza B by PCR NEGATIVE NEGATIVE Final    Comment: (NOTE) The Xpert Xpress SARS-CoV-2/FLU/RSV plus assay is intended as an aid in the diagnosis of influenza from Nasopharyngeal swab specimens and should not be used as a sole basis for treatment. Nasal washings and aspirates are unacceptable for Xpert Xpress SARS-CoV-2/FLU/RSV testing.  Fact Sheet for Patients: EntrepreneurPulse.com.au  Fact Sheet for Healthcare Providers: IncredibleEmployment.be  This test is not yet approved or cleared by the [place] FDA and has been authorized for detection and/or diagnosis of SARS-CoV-2 by FDA under an Emergency Use Authorization (EUA). This EUA will remain in effect (meaning this test can be used) for the duration of the COVID-19 declaration under Section 564(b)(1) of the Act, 21 U.S.C. section 360bbb-3(b)(1), unless the authorization is terminated or revoked.  Performed at [facility], [address], [place], [place] [address]  Radiology Studies: US RENAL  Result Date: 10/07/2021 CLINICAL DATA:  Acute kidney injury EXAM: RENAL / URINARY TRACT ULTRASOUND COMPLETE COMPARISON:  CT 09/16/2021 FINDINGS: Right Kidney: Renal measurements: 11.0 x 5.5 x 5.6 cm = volume: 176 mL. Cortical thinning and increased parenchymal echogenicity. 4.7 cm simple cyst arises from the interpolar region of the right kidney. No solid mass, shadowing stone, or hydronephrosis visualized. Left Kidney: Renal measurements: 12.4 x 5.0 x 5.3 cm = volume: 173 mL. Cortical thinning and increased parenchymal echogenicity. No mass, shadowing stone, or hydronephrosis visualized. Bladder: Appears normal for degree of bladder distention. Other: None. IMPRESSION: 1. No evidence of obstructive uropathy.  2. Appearance of the kidneys suggests chronic medical renal disease. 3. Simple 4.7 cm right renal cyst. Electronically Signed   By: Davina Poke D.O.   On: 10/07/2021 13:40        Scheduled Meds:  B-complex with vitamin C  1 tablet Oral Daily   cholecalciferol  2,000 Units Oral Daily   dorzolamide  1 drop Left Eye BID   heparin  5,000 Units Subcutaneous Q8H   latanoprost  1 drop Left Eye QHS   magnesium chloride  64 mg Oral Daily   melatonin  5 mg Oral QHS   mometasone-formoterol  2 puff Inhalation BID   And   umeclidinium bromide  1 puff Inhalation Daily   multivitamin with minerals  1 tablet Oral Daily   omega-3 acid ethyl esters  1,000 mg Oral Daily   pravastatin  40 mg Oral QPM   traZODone  50 mg Oral QHS   vitamin E  400 Units Oral Daily   Continuous Infusions:  sodium chloride 75 mL/hr at 10/08/21 0843   lactated ringers 75 mL/hr at 10/08/21 0837     LOS: 0 days    Time spent: 30 mins    Wyvonnia Dusky, MD [facility] Pager [phone]  If 7PM-7AM, please contact night-coverage 10/08/2021, 4:09 PM

## 2021-10-09 DIAGNOSIS — I5022 Chronic systolic (congestive) heart failure: Secondary | ICD-10-CM | POA: Diagnosis not present

## 2021-10-09 DIAGNOSIS — I10 Essential (primary) hypertension: Secondary | ICD-10-CM | POA: Diagnosis not present

## 2021-10-09 DIAGNOSIS — N1831 Chronic kidney disease, stage 3a: Secondary | ICD-10-CM | POA: Diagnosis not present

## 2021-10-09 DIAGNOSIS — N179 Acute kidney failure, unspecified: Secondary | ICD-10-CM | POA: Diagnosis not present

## 2021-10-09 LAB — CBC
HCT: 31.6 % — ABNORMAL LOW (ref 36.0–46.0)
Hemoglobin: 10.3 g/dL — ABNORMAL LOW (ref 12.0–15.0)
MCH: 29.6 pg (ref 26.0–34.0)
MCHC: 32.6 g/dL (ref 30.0–36.0)
MCV: 90.8 fL (ref 80.0–100.0)
Platelets: 162 10*3/uL (ref 150–400)
RBC: 3.48 MIL/uL — ABNORMAL LOW (ref 3.87–5.11)
RDW: 13.2 % (ref 11.5–15.5)
WBC: 4 10*3/uL (ref 4.0–10.5)
nRBC: 0 % (ref 0.0–0.2)

## 2021-10-09 LAB — BASIC METABOLIC PANEL
Anion gap: 8 (ref 5–15)
BUN: 42 mg/dL — ABNORMAL HIGH (ref 8–23)
CO2: 19 mmol/L — ABNORMAL LOW (ref 22–32)
Calcium: 8.3 mg/dL — ABNORMAL LOW (ref 8.9–10.3)
Chloride: 110 mmol/L (ref 98–111)
Creatinine, Ser: 1.48 mg/dL — ABNORMAL HIGH (ref 0.44–1.00)
GFR, Estimated: 36 mL/min — ABNORMAL LOW (ref >60)
Glucose, Bld: 84 mg/dL (ref 70–99)
Potassium: 4.5 mmol/L (ref 3.5–5.1)
Sodium: 137 mmol/L (ref 135–145)

## 2021-10-09 NOTE — Discharge Summary (Signed)
Physician Discharge Summary  Virginia Walters GTX:[id number] DOB: [birth year] DOA: 10/07/2021  PCP: Susy Frizzle, MD  Admit date: 10/07/2021 Discharge date: 10/09/2021  Admitted From: home  Disposition:  home w/ home health  Recommendations for Outpatient Follow-up:  Follow up with PCP in 1 week Will need  BMP to check Cr/GFR in 1 week F/u w/ nephrology in 1-2 weeks for CKD   Home Health: yes Equipment/Devices:  Discharge Condition: stable  CODE STATUS: full  Diet recommendation: Heart Healthy   Brief/Interim Summary:  HPI was taken from Dr. Blaine Hamper: Virginia Walters is a 81 y.o. female with medical history significant of hypertension, hyperlipidemia, COPD, GERD, PFO, retinal artery occlusion, sCHF with EF of 45-50%, memory loss, anemia, CKD-3a, acute cholecystitis s/p perc cholecystotomy drain 09/17/2021, alcohol abuse in remission, who presents with worsening renal function.   Patient was recently hospitalized from 2/2 - 2/7 due to cholecystitis.  Patient is s/p of posterior PERC procedure. She followed up with her primary care provider who checked labs and it was noted that her creatinine was around 3.4 from baseline of 0.9.  She was told to come to the emergency department. Patient notes she has not been drinking as much is normal because she just is not thirsty.  Patient denies any nausea, vomiting, diarrhea or abdominal pain.  No chest pain, cough, shortness breath.  No fever or chills.  Denies symptoms of UTI. She is chronically incontinent of urine.     Data Reviewed and ED Course: pt was found to have worsening renal function with creatinine 2.91 and BUN 60 (creatinine 0.9 on 09/19/2021), WBC 6.4, magnesium 2.5, phosphorus of 4.5, temperature normal, blood pressure 99/59, heart rate 80, RR 20, oxygen saturation 93-97% on room air.  Patient is placed on MedSurg bed for observation.   As per Dr. Jimmye Norman 2/24-2/25/23: Pt presented w/ AKI on CKDIIIa that improved w/ IVFs and  supportive care. Cr was 1.48 on the day of d/c. Pt denied NSAID use.   Discharge Diagnoses:  Principal Problem:   Acute renal failure superimposed on stage 3a chronic kidney disease (HCC) Active Problems:   HTN (hypertension)   Dyslipidemia   Chronic systolic heart failure (HCC)   Iron deficiency anemia   COPD (chronic obstructive pulmonary disease) (HCC)   Pressure injury of skin   AKI (acute kidney injury) ([place])   AKI on CKDIII: baseline Cr 0.9 on 09/19/2021. Cr is trending down daily.  Recently had PERC cholecystotomy procedure, did not drink fluids as normally she dose. Possibly due to dehydration and continuation of lisinopril. Lisinopril was held throughout hospital stay. Continue on IVFs.    HTN: can restart home dose of lisinopril at d/c    Dyslipidemia: continue on statin    Chronic systolic CHF: echo on 24/82/5003 showed EF 45-50%. Does not seem to have CHF exacerbation. Monitor I/Os.    IDA: continue on iron supplement    COPD : w/o exacerbation. Continue on bronchodilators  Obesity: BMI 34.9. Complicates overall care & prognosis   Discharge Instructions  Discharge Instructions     Diet - low sodium heart healthy   Complete by: As directed    Discharge instructions   Complete by: As directed    F/u w/ PCP in 1-2 weeks. Will likely need to get a referral from your PCP to see a nephrologist for CKD if you dont already see a nephrologist.   Discharge instructions   Complete by: As directed    Get BMP with your  PCP to check your Cr/GFR (kidney function) within 1 week   Increase activity slowly   Complete by: As directed    No wound care   Complete by: As directed    No wound care   Complete by: As directed       Allergies as of 10/09/2021       Reactions   Oxycodone Itching        Medication List     STOP taking these medications    doxycycline 100 MG tablet Commonly known as: VIBRA-TABS   hydrochlorothiazide 25 MG tablet Commonly known as:  HYDRODIURIL   Turmeric 500 MG Caps   verapamil 240 MG CR tablet Commonly known as: CALAN-SR       TAKE these medications    B-COMPLEX PO Take 1 capsule by mouth daily.   Breztri Aerosphere 160-9-4.8 MCG/ACT Aero Generic drug: Budeson-Glycopyrrol-Formoterol Inhale 2 puffs into the lungs in the morning and at bedtime.   CALCIUM + D PO Take 1 capsule by mouth 2 (two) times daily.   dorzolamide 2 % ophthalmic solution Commonly known as: TRUSOPT Place 1 drop into the left eye 2 (two) times daily.   feeding supplement Liqd Take 237 mLs by mouth 2 (two) times daily between meals.   ferrous sulfate 325 (65 FE) MG EC tablet Take 1 tablet (325 mg total) by mouth 2 (two) times daily.   fish oil-omega-3 fatty acids 1000 MG capsule Take 1,000 mg by mouth daily.   ipratropium-albuterol 0.5-2.5 (3) MG/3ML Soln Commonly known as: DUONEB Take 3 mLs by nebulization every 4 (four) hours as needed (Wheezing or shortness of breath).   latanoprost 0.005 % ophthalmic solution Commonly known as: XALATAN Place 1 drop into the left eye at bedtime.   lisinopril 40 MG tablet Commonly known as: ZESTRIL TAKE 1 TABLET BY MOUTH  DAILY   magnesium 30 MG tablet Take 30 mg by mouth 2 (two) times daily.   melatonin 5 MG Tabs Take 5 mg by mouth at bedtime.   multivitamin tablet Take 1 tablet by mouth daily.   potassium chloride 10 MEQ tablet Commonly known as: KLOR-CON TAKE 1 TABLET BY MOUTH  DAILY   pravastatin 40 MG tablet Commonly known as: PRAVACHOL TAKE 1 TABLET BY MOUTH  DAILY   Symbicort 160-4.5 MCG/ACT inhaler Generic drug: budesonide-formoterol USE 2 INHALATIONS BY MOUTH  TWICE DAILY   traZODone 50 MG tablet Commonly known as: DESYREL TAKE 1 TABLET BY MOUTH AT  BEDTIME   venlafaxine XR 75 MG 24 hr capsule Commonly known as: EFFEXOR-XR TAKE 1 CAPSULE BY MOUTH  DAILY WITH BREAKFAST   Vitamin D 50 MCG (2000 UT) Caps Take 1 capsule by mouth daily.   vitamin E 180 MG  (400 UNITS) capsule Take 400 Units by mouth daily.        Follow-up Information     Susy Frizzle, MD Follow up in 1 week(s).   Specialty: Family Medicine Why: needs BMP to check Cr Contact information: [address] [place] [address] [phone]                Allergies  Allergen Reactions   Oxycodone Itching    Consultations:    Procedures/Studies: CT ABDOMEN PELVIS W CONTRAST  Result Date: 09/16/2021 CLINICAL DATA:  Acute abdominal pain. Right-sided pain and vomiting for 2 days EXAM: CT ABDOMEN AND PELVIS WITH CONTRAST TECHNIQUE: Multidetector CT imaging of the abdomen and pelvis was performed using the standard protocol following  bolus administration of intravenous contrast. RADIATION DOSE REDUCTION: This exam was performed according to the departmental dose-optimization program which includes automated exposure control, adjustment of the mA and/or kV according to patient size and/or use of iterative reconstruction technique. CONTRAST:  147mL OMNIPAQUE IOHEXOL 300 MG/ML  SOLN COMPARISON:  None. FINDINGS: Lower chest: No acute abnormality. Hepatobiliary: Diffuse low attenuation of the liver as can be seen with hepatic steatosis. No hepatic mass. No intrahepatic or extrahepatic biliary ductal dilatation. Distended gallbladder with cholelithiasis and pericholecystic inflammatory changes as can be seen with acute cholecystitis. Pancreas: Unremarkable. No pancreatic ductal dilatation or surrounding inflammatory changes. Spleen: Normal in size without focal abnormality. Adrenals/Urinary Tract: Normal adrenal glands. 4.5 cm right interpolar fluid attenuating renal mass consistent with a cyst. No urolithiasis or obstructive uropathy. Normal bladder. Stomach/Bowel: Moderate size hiatal hernia. Stomach is otherwise unremarkable. Appendix appears normal. No evidence of bowel wall thickening, distention, or inflammatory changes. Incidental note made of a duodenal  diverticulum. Diverticulosis without evidence of diverticulitis. Vascular/Lymphatic: Aortic atherosclerosis. No enlarged abdominal or pelvic lymph nodes. Reproductive: Uterus and bilateral adnexa are unremarkable. Other: No abdominal wall hernia or abnormality. No abdominopelvic ascites. Musculoskeletal: No acute osseous abnormality. Chronic T12 and L4 vertebral body compression fractures. Anterior bridging osteophytes throughout the thoracolumbar spine as can be seen with diffuse idiopathic skeletal hyperostosis. Degenerative disease with disc height loss at L3-4, L4-5 and L5-S1. Severe bilateral facet arthropathy at L3-4, L4-5 and L5-S1. Severe bilateral foraminal stenosis at L3-4. Severe right foraminal stenosis at L4-5. Severe right foraminal stenosis at L5-S1. Moderate left foraminal stenosis at L5-S1. IMPRESSION: 1. Distended gallbladder with cholelithiasis and pericholecystic inflammatory changes as can be seen with acute cholecystitis. 2. Hepatic steatosis. 3. Moderate size hiatal hernia. 4. Diverticulosis without evidence of diverticulitis. 5. Diffuse lumbar spine spondylosis as described above. 6. Aortic Atherosclerosis (ICD10-I70.0). Electronically Signed   By: Kathreen Devoid M.D.   On: 09/16/2021 15:17   US RENAL  Result Date: 10/07/2021 CLINICAL DATA:  Acute kidney injury EXAM: RENAL / URINARY TRACT ULTRASOUND COMPLETE COMPARISON:  CT 09/16/2021 FINDINGS: Right Kidney: Renal measurements: 11.0 x 5.5 x 5.6 cm = volume: 176 mL. Cortical thinning and increased parenchymal echogenicity. 4.7 cm simple cyst arises from the interpolar region of the right kidney. No solid mass, shadowing stone, or hydronephrosis visualized. Left Kidney: Renal measurements: 12.4 x 5.0 x 5.3 cm = volume: 173 mL. Cortical thinning and increased parenchymal echogenicity. No mass, shadowing stone, or hydronephrosis visualized. Bladder: Appears normal for degree of bladder distention. Other: None. IMPRESSION: 1. No evidence of  obstructive uropathy. 2. Appearance of the kidneys suggests chronic medical renal disease. 3. Simple 4.7 cm right renal cyst. Electronically Signed   By: Davina Poke D.O.   On: 10/07/2021 13:40   IR Perc Cholecystostomy  Result Date: 09/17/2021 INDICATION: Acute cholecystitis. Poor operative candidate. Please perform image guided cholecystostomy tube placement for infection source control purposes. EXAM: ULTRASOUND AND FLUOROSCOPIC-GUIDED CHOLECYSTOSTOMY TUBE PLACEMENT COMPARISON:  None. MEDICATIONS: The patient is currently admitted to the hospital and on intravenous antibiotics. Antibiotics were administered within an appropriate time frame prior to skin puncture. ANESTHESIA/SEDATION: Moderate (conscious) sedation was employed during this procedure as administered by the Interventional Radiology RN. A total of Versed 0.25 mg and Fentanyl 25 mcg was administered intravenously. Moderate Sedation Time: 15 minutes. The patient's level of consciousness and vital signs were monitored continuously by radiology nursing throughout the procedure under my direct supervision. CONTRAST:  10 cc Omnipaque 300 - administered into the gallbladder  lumen. FLUOROSCOPY TIME:  1 minute, 6 seconds (46 mGy) COMPLICATIONS: None immediate. PROCEDURE: Informed written consent was obtained from the patient after a discussion of the risks, benefits and alternatives to treatment. Questions regarding the procedure were encouraged and answered. A timeout was performed prior to the initiation of the procedure. The right upper abdominal quadrant was prepped and draped in the usual sterile fashion, and a sterile drape was applied covering the operative field. Maximum barrier sterile technique with sterile gowns and gloves were used for the procedure. A timeout was performed prior to the initiation of the procedure. Local anesthesia was provided with 1% lidocaine with epinephrine. Ultrasound scanning of the right upper quadrant demonstrates  a markedly dilated gallbladder. Of note, the patient reported pain with ultrasound imaging over the gallbladder. Utilizing a transhepatic approach, a 22 gauge needle was advanced into the gallbladder under direct ultrasound guidance. An ultrasound image was saved for documentation purposes. Appropriate intraluminal puncture was confirmed with the efflux of bile and advancement of an 0.018 wire into the gallbladder lumen. The needle was exchanged for an [place] set. A small amount of contrast was injected to confirm appropriate intraluminal positioning. Over a [place] wire, a 16.2-French Cook cholecystomy tube was advanced into the gallbladder fossa, coiled and locked. Bile was aspirated and a small amount of contrast was injected as several post procedural spot radiographic images were obtained in various obliquities. The catheter was secured to the skin with suture, connected to a drainage bag and a dressing was applied. The patient tolerated the procedure well without immediate post procedural complication. IMPRESSION: Successful ultrasound and fluoroscopic guided placement of a 10.2 French cholecystostomy tube. Electronically Signed   By: Sandi Mariscal M.D.   On: 09/17/2021 13:50   DG CHEST PORT 1 VIEW  Result Date: 09/17/2021 CLINICAL DATA:  Tachypnea EXAM: PORTABLE CHEST 1 VIEW COMPARISON:  Chest radiograph 09/16/2021 FINDINGS: The heart is mildly enlarged, unchanged. The upper mediastinal contours are normal. There is no focal consolidation or pulmonary edema. There is no pleural effusion or pneumothorax. There is unchanged asymmetric elevation of the right hemidiaphragm. The bones are stable. IMPRESSION: Stable chest with no radiographic evidence of acute cardiopulmonary process. Electronically Signed   By: Valetta Mole M.D.   On: 09/17/2021 08:13   DG CHEST PORT 1 VIEW  Result Date: 09/16/2021 CLINICAL DATA:  Acute abdominal pain. EXAM: PORTABLE CHEST 1 VIEW COMPARISON:  July 22, 2021. FINDINGS:  Stable cardiomediastinal silhouette. Both lungs are clear. The visualized skeletal structures are unremarkable. IMPRESSION: No active disease. Electronically Signed   By: Marijo Conception M.D.   On: 09/16/2021 16:56   (Echo, Carotid, EGD, Colonoscopy, ERCP)    Subjective: Pt c/o malaise    Discharge Exam: Vitals:   10/09/21 0500 10/09/21 0829  BP: (!) 130/47 (!) 142/64  Pulse: 65 71  Resp: 18 18  Temp: 97.7 F (36.5 C) (!) 97.5 F (36.4 C)  SpO2: 100% 99%   Vitals:   10/08/21 1723 10/08/21 2054 10/09/21 0500 10/09/21 0829  BP: 134/64 (!) 120/50 (!) 130/47 (!) 142/64  Pulse: (!) 104 81 65 71  Resp: 20 16 18 18   Temp: 98.4 F (36.9 C) 98 F (36.7 C) 97.7 F (36.5 C) (!) 97.5 F (36.4 C)  TempSrc: Oral Oral Oral Oral  SpO2: 97% 95% 100% 99%  Weight:   99.3 kg   Height:        General: Pt is alert, awake, not in acute distress Cardiovascular: S1/S2 +, no  rubs, no gallops Respiratory: CTA bilaterally, no wheezing, no rhonchi Abdominal: Soft, NT, obese, bowel sounds + Extremities:  no cyanosis    The results of significant diagnostics from this hospitalization (including imaging, microbiology, ancillary and laboratory) are listed below for reference.     Microbiology: Recent Results (from the past 240 hour(s))  Resp Panel by RT-PCR (Flu A&B, Covid) Nasopharyngeal Swab     Status: None   Collection Time: 10/07/21 10:53 AM   Specimen: Nasopharyngeal Swab; Nasopharyngeal(NP) swabs in vial transport medium  Result Value Ref Range Status   SARS Coronavirus 2 by RT PCR NEGATIVE NEGATIVE Final    Comment: (NOTE) SARS-CoV-2 target nucleic acids are NOT DETECTED.  The SARS-CoV-2 RNA is generally detectable in upper respiratory specimens during the acute phase of infection. The lowest concentration of SARS-CoV-2 viral copies this assay can detect is 138 copies/mL. A negative result does not preclude SARS-Cov-2 infection and should not be used as the sole basis for treatment  or other patient management decisions. A negative result may occur with  improper specimen collection/handling, submission of specimen other than nasopharyngeal swab, presence of viral mutation(s) within the areas targeted by this assay, and inadequate number of viral copies(<138 copies/mL). A negative result must be combined with clinical observations, patient history, and epidemiological information. The expected result is Negative.  Fact Sheet for Patients:  EntrepreneurPulse.com.au  Fact Sheet for Healthcare Providers:  IncredibleEmployment.be  This test is no t yet approved or cleared by the [place] FDA and  has been authorized for detection and/or diagnosis of SARS-CoV-2 by FDA under an Emergency Use Authorization (EUA). This EUA will remain  in effect (meaning this test can be used) for the duration of the COVID-19 declaration under Section 564(b)(1) of the Act, 21 U.S.C.section 360bbb-3(b)(1), unless the authorization is terminated  or revoked sooner.       Influenza A by PCR NEGATIVE NEGATIVE Final   Influenza B by PCR NEGATIVE NEGATIVE Final    Comment: (NOTE) The Xpert Xpress SARS-CoV-2/FLU/RSV plus assay is intended as an aid in the diagnosis of influenza from Nasopharyngeal swab specimens and should not be used as a sole basis for treatment. Nasal washings and aspirates are unacceptable for Xpert Xpress SARS-CoV-2/FLU/RSV testing.  Fact Sheet for Patients: EntrepreneurPulse.com.au  Fact Sheet for Healthcare Providers: IncredibleEmployment.be  This test is not yet approved or cleared by the [place] FDA and has been authorized for detection and/or diagnosis of SARS-CoV-2 by FDA under an Emergency Use Authorization (EUA). This EUA will remain in effect (meaning this test can be used) for the duration of the COVID-19 declaration under Section 564(b)(1) of the Act, 21 U.S.C. section  360bbb-3(b)(1), unless the authorization is terminated or revoked.  Performed at [facility], [address] [place], [place] [address]      Labs: BNP (last 3 results) Recent Labs    11/13/20 1438 09/16/21 1354 10/07/21 0943  BNP 62 194.6* 8.5   Basic Metabolic Panel: Recent Labs  Lab 10/05/21 1647 10/07/21 0943 10/08/21 0437 10/09/21 0643  NA 139 136 138 137  K 4.9 4.7 4.8 4.5  CL 104 108 109 110  CO2 21 19* 23 19*  GLUCOSE 148* 128* 93 84  BUN 52* 60* 56* 42*  CREATININE 3.38* 2.91* 1.97* 1.48*  CALCIUM 9.3 8.4* 8.2* 8.3*  MG  --  2.5*  --   --   PHOS  --  4.5  --   --    Liver Function Tests: Recent  Labs  Lab 10/05/21 1647  AST 17  ALT 25  BILITOT 0.3  PROT 6.8   No results for input(s): LIPASE, AMYLASE in the last 168 hours. No results for input(s): AMMONIA in the last 168 hours. CBC: Recent Labs  Lab 10/05/21 1647 10/07/21 0943 10/09/21 0643  WBC 7.3 6.4 4.0  NEUTROABS 4,745  --   --   HGB 12.8 11.8* 10.3*  HCT 38.8 36.6 31.6*  MCV 91.3 93.1 90.8  PLT 298 221 162   Cardiac Enzymes: No results for input(s): CKTOTAL, CKMB, CKMBINDEX, TROPONINI in the last 168 hours. BNP: Invalid input(s): POCBNP CBG: No results for input(s): GLUCAP in the last 168 hours. D-Dimer No results for input(s): DDIMER in the last 72 hours. Hgb A1c No results for input(s): HGBA1C in the last 72 hours. Lipid Profile No results for input(s): CHOL, HDL, LDLCALC, TRIG, CHOLHDL, LDLDIRECT in the last 72 hours. Thyroid function studies No results for input(s): TSH, T4TOTAL, T3FREE, THYROIDAB in the last 72 hours.  Invalid input(s): FREET3 Anemia work up No results for input(s): VITAMINB12, FOLATE, FERRITIN, TIBC, IRON, RETICCTPCT in the last 72 hours. Urinalysis    Component Value Date/Time   COLORURINE YELLOW (A) 10/08/2021 1015   APPEARANCEUR CLOUDY (A) 10/08/2021 1015   LABSPEC 1.011 10/08/2021 1015   PHURINE 5.0 10/08/2021 1015   GLUCOSEU  NEGATIVE 10/08/2021 1015   HGBUR NEGATIVE 10/08/2021 1015   BILIRUBINUR NEGATIVE 10/08/2021 1015   KETONESUR NEGATIVE 10/08/2021 1015   PROTEINUR NEGATIVE 10/08/2021 1015   NITRITE NEGATIVE 10/08/2021 1015   LEUKOCYTESUR LARGE (A) 10/08/2021 1015   Sepsis Labs Invalid input(s): PROCALCITONIN,  WBC,  LACTICIDVEN Microbiology Recent Results (from the past 240 hour(s))  Resp Panel by RT-PCR (Flu A&B, Covid) Nasopharyngeal Swab     Status: None   Collection Time: 10/07/21 10:53 AM   Specimen: Nasopharyngeal Swab; Nasopharyngeal(NP) swabs in vial transport medium  Result Value Ref Range Status   SARS Coronavirus 2 by RT PCR NEGATIVE NEGATIVE Final    Comment: (NOTE) SARS-CoV-2 target nucleic acids are NOT DETECTED.  The SARS-CoV-2 RNA is generally detectable in upper respiratory specimens during the acute phase of infection. The lowest concentration of SARS-CoV-2 viral copies this assay can detect is 138 copies/mL. A negative result does not preclude SARS-Cov-2 infection and should not be used as the sole basis for treatment or other patient management decisions. A negative result may occur with  improper specimen collection/handling, submission of specimen other than nasopharyngeal swab, presence of viral mutation(s) within the areas targeted by this assay, and inadequate number of viral copies(<138 copies/mL). A negative result must be combined with clinical observations, patient history, and epidemiological information. The expected result is Negative.  Fact Sheet for Patients:  EntrepreneurPulse.com.au  Fact Sheet for Healthcare Providers:  IncredibleEmployment.be  This test is no t yet approved or cleared by the [place] FDA and  has been authorized for detection and/or diagnosis of SARS-CoV-2 by FDA under an Emergency Use Authorization (EUA). This EUA will remain  in effect (meaning this test can be used) for the duration of  the COVID-19 declaration under Section 564(b)(1) of the Act, 21 U.S.C.section 360bbb-3(b)(1), unless the authorization is terminated  or revoked sooner.       Influenza A by PCR NEGATIVE NEGATIVE Final   Influenza B by PCR NEGATIVE NEGATIVE Final    Comment: (NOTE) The Xpert Xpress SARS-CoV-2/FLU/RSV plus assay is intended as an aid in the diagnosis of influenza from Nasopharyngeal swab specimens and should  not be used as a sole basis for treatment. Nasal washings and aspirates are unacceptable for Xpert Xpress SARS-CoV-2/FLU/RSV testing.  Fact Sheet for Patients: EntrepreneurPulse.com.au  Fact Sheet for Healthcare Providers: IncredibleEmployment.be  This test is not yet approved or cleared by the [place] FDA and has been authorized for detection and/or diagnosis of SARS-CoV-2 by FDA under an Emergency Use Authorization (EUA). This EUA will remain in effect (meaning this test can be used) for the duration of the COVID-19 declaration under Section 564(b)(1) of the Act, 21 U.S.C. section 360bbb-3(b)(1), unless the authorization is terminated or revoked.  Performed at [facility], [address] [place], [place] [address]      Time coordinating discharge: Over 30 minutes  SIGNED:   Wyvonnia Dusky, MD  [facility] 10/09/2021, 11:43 AM Pager   If 7PM-7AM, please contact night-coverage

## 2021-10-09 NOTE — TOC Progression Note (Signed)
Transition of Care [facility]) - Progression Note    Patient Details  Name: BOBBE QUILTER MRN: [id number] Date of Birth: November 19, 1940  [facility]) CM/SW [place], LCSW Phone Number: 10/09/2021, 10:57 AM  Clinical Narrative:     [place] will service. Pt dc.       Expected Discharge Plan and Services           Expected Discharge Date: 10/09/21                                     Social Determinants of Health (SDOH) Interventions    Readmission Risk Interventions Readmission Risk Prevention Plan 09/20/2021  Transportation Screening Complete  PCP or Specialist Appt within 5-7 Days Complete  Home Care Screening Complete  Medication Review (RN CM) Complete  Some recent data might be hidden

## 2021-10-11 ENCOUNTER — Telehealth: Payer: Self-pay

## 2021-10-11 DIAGNOSIS — J449 Chronic obstructive pulmonary disease, unspecified: Secondary | ICD-10-CM | POA: Diagnosis not present

## 2021-10-11 DIAGNOSIS — Z934 Other artificial openings of gastrointestinal tract status: Secondary | ICD-10-CM | POA: Diagnosis not present

## 2021-10-11 DIAGNOSIS — Q2112 Patent foramen ovale: Secondary | ICD-10-CM | POA: Diagnosis not present

## 2021-10-11 DIAGNOSIS — Z9181 History of falling: Secondary | ICD-10-CM | POA: Diagnosis not present

## 2021-10-11 DIAGNOSIS — N1831 Chronic kidney disease, stage 3a: Secondary | ICD-10-CM | POA: Diagnosis not present

## 2021-10-11 DIAGNOSIS — I5022 Chronic systolic (congestive) heart failure: Secondary | ICD-10-CM | POA: Diagnosis not present

## 2021-10-11 DIAGNOSIS — I130 Hypertensive heart and chronic kidney disease with heart failure and stage 1 through stage 4 chronic kidney disease, or unspecified chronic kidney disease: Secondary | ICD-10-CM | POA: Diagnosis not present

## 2021-10-11 DIAGNOSIS — R413 Other amnesia: Secondary | ICD-10-CM | POA: Diagnosis not present

## 2021-10-11 DIAGNOSIS — Z87891 Personal history of nicotine dependence: Secondary | ICD-10-CM | POA: Diagnosis not present

## 2021-10-11 DIAGNOSIS — K219 Gastro-esophageal reflux disease without esophagitis: Secondary | ICD-10-CM | POA: Diagnosis not present

## 2021-10-11 DIAGNOSIS — D509 Iron deficiency anemia, unspecified: Secondary | ICD-10-CM | POA: Diagnosis not present

## 2021-10-11 DIAGNOSIS — E785 Hyperlipidemia, unspecified: Secondary | ICD-10-CM | POA: Diagnosis not present

## 2021-10-11 DIAGNOSIS — N179 Acute kidney failure, unspecified: Secondary | ICD-10-CM | POA: Diagnosis not present

## 2021-10-11 DIAGNOSIS — Z7951 Long term (current) use of inhaled steroids: Secondary | ICD-10-CM | POA: Diagnosis not present

## 2021-10-11 DIAGNOSIS — H34239 Retinal artery branch occlusion, unspecified eye: Secondary | ICD-10-CM | POA: Diagnosis not present

## 2021-10-11 DIAGNOSIS — R296 Repeated falls: Secondary | ICD-10-CM | POA: Diagnosis not present

## 2021-10-11 LAB — UREA NITROGEN, URINE: Urea Nitrogen, Ur: 611 mg/dL

## 2021-10-11 NOTE — Telephone Encounter (Signed)
Transition Care Management Follow-up Telephone Call Date of discharge and from where: 10/09/21 [facility] How have you been since you were released from the hospital? Pt states she is doing fine.  Any questions or concerns? No  Items Reviewed: Did the pt receive and understand the discharge instructions provided? Yes  Medications obtained and verified? Yes  Other? No  Any new allergies since your discharge? No  Dietary orders reviewed? Yes Do you have support at home? Yes   Home Care and Equipment/Supplies: Were home health services ordered? yes If so, what is the name of the agency? Pt unable to remember name of Agency and name is not in D/C summary.  Has the agency set up a time to come to the patient's home? yes Were any new equipment or medical supplies ordered?  No What is the name of the medical supply agency? N/A Were you able to get the supplies/equipment? no Do you have any questions related to the use of the equipment or supplies? No  Functional Questionnaire: (I = Independent and D = Dependent) ADLs: I  Bathing/Dressing- I  Meal Prep- I  Eating- I  Maintaining continence- I  Transferring/Ambulation- I  Managing Meds- I  Follow up appointments reviewed:  PCP Hospital f/u appt confirmed? Yes  Scheduled to see DR. PICKARD on 10/14/21 @ 2:15. [facility] f/u appt confirmed? No  Scheduled to see PW WILL NEED REFERRAL TO NEPHROLOGY. Are transportation arrangements needed? No  If their condition worsens, is the pt aware to call PCP or go to the Emergency Dept.? Yes Was the patient provided with contact information for the PCP's office or ED? Yes Was to pt encouraged to call back with questions or concerns? Yes

## 2021-10-12 ENCOUNTER — Telehealth: Payer: Self-pay

## 2021-10-12 NOTE — Telephone Encounter (Signed)
Chris w/[facility] called to ask for verbal approval for PT and RN orders, 2xw/3w and 1xw/6w. Verbal approval given. Nothing further needed at this time.

## 2021-10-13 ENCOUNTER — Telehealth: Payer: Self-pay

## 2021-10-13 DIAGNOSIS — H34239 Retinal artery branch occlusion, unspecified eye: Secondary | ICD-10-CM | POA: Diagnosis not present

## 2021-10-13 DIAGNOSIS — N179 Acute kidney failure, unspecified: Secondary | ICD-10-CM | POA: Diagnosis not present

## 2021-10-13 DIAGNOSIS — Z934 Other artificial openings of gastrointestinal tract status: Secondary | ICD-10-CM | POA: Diagnosis not present

## 2021-10-13 DIAGNOSIS — K219 Gastro-esophageal reflux disease without esophagitis: Secondary | ICD-10-CM | POA: Diagnosis not present

## 2021-10-13 DIAGNOSIS — I5022 Chronic systolic (congestive) heart failure: Secondary | ICD-10-CM | POA: Diagnosis not present

## 2021-10-13 DIAGNOSIS — D509 Iron deficiency anemia, unspecified: Secondary | ICD-10-CM | POA: Diagnosis not present

## 2021-10-13 DIAGNOSIS — J449 Chronic obstructive pulmonary disease, unspecified: Secondary | ICD-10-CM | POA: Diagnosis not present

## 2021-10-13 DIAGNOSIS — Z7951 Long term (current) use of inhaled steroids: Secondary | ICD-10-CM | POA: Diagnosis not present

## 2021-10-13 DIAGNOSIS — Z87891 Personal history of nicotine dependence: Secondary | ICD-10-CM | POA: Diagnosis not present

## 2021-10-13 DIAGNOSIS — R296 Repeated falls: Secondary | ICD-10-CM | POA: Diagnosis not present

## 2021-10-13 DIAGNOSIS — E785 Hyperlipidemia, unspecified: Secondary | ICD-10-CM | POA: Diagnosis not present

## 2021-10-13 DIAGNOSIS — I130 Hypertensive heart and chronic kidney disease with heart failure and stage 1 through stage 4 chronic kidney disease, or unspecified chronic kidney disease: Secondary | ICD-10-CM | POA: Diagnosis not present

## 2021-10-13 DIAGNOSIS — R413 Other amnesia: Secondary | ICD-10-CM | POA: Diagnosis not present

## 2021-10-13 DIAGNOSIS — Z9181 History of falling: Secondary | ICD-10-CM | POA: Diagnosis not present

## 2021-10-13 DIAGNOSIS — N1831 Chronic kidney disease, stage 3a: Secondary | ICD-10-CM | POA: Diagnosis not present

## 2021-10-13 DIAGNOSIS — Q2112 Patent foramen ovale: Secondary | ICD-10-CM | POA: Diagnosis not present

## 2021-10-13 NOTE — Chronic Care Management (AMB) (Signed)
?  Care Management  ? ?Note ? ?10/13/2021 ?Name: [name] MRN: [id number] DOB: Jul 15, 1941 ? ?[name] is a 81 y.o. year old female who is a primary care patient of Dennard Schaumann, Cammie Mcgee, MD and is actively engaged with the care management team. I reached out to [name] by phone today to assist with scheduling an initial visit with the RN Case Manager ? ?Follow up plan: ?Telephone appointment with care management team member scheduled for:10/18/21 ? ?Laverda Sorenson  ?Care Guide, Embedded Care Coordination ?[place]  Care Management  ?Direct Dial: [phone] ? ?

## 2021-10-13 NOTE — Telephone Encounter (Signed)
[place] w/[facility] called to request approval for PT orders 2xw/3w and 1xw/6w. Verbal approval given. Nothing further needed at this time.  ? ?

## 2021-10-14 ENCOUNTER — Ambulatory Visit (INDEPENDENT_AMBULATORY_CARE_PROVIDER_SITE_OTHER): Payer: Medicare Other | Admitting: Family Medicine

## 2021-10-14 ENCOUNTER — Other Ambulatory Visit: Payer: Self-pay

## 2021-10-14 ENCOUNTER — Encounter: Payer: Self-pay | Admitting: Family Medicine

## 2021-10-14 VITALS — BP 138/82 | HR 85 | Temp 96.6°F | Resp 18 | Ht 67.0 in | Wt 219.0 lb

## 2021-10-14 DIAGNOSIS — N1831 Chronic kidney disease, stage 3a: Secondary | ICD-10-CM | POA: Diagnosis not present

## 2021-10-14 DIAGNOSIS — K8020 Calculus of gallbladder without cholecystitis without obstruction: Secondary | ICD-10-CM | POA: Diagnosis not present

## 2021-10-14 DIAGNOSIS — N179 Acute kidney failure, unspecified: Secondary | ICD-10-CM | POA: Diagnosis not present

## 2021-10-14 DIAGNOSIS — Q2112 Patent foramen ovale: Secondary | ICD-10-CM | POA: Diagnosis not present

## 2021-10-14 DIAGNOSIS — Z934 Other artificial openings of gastrointestinal tract status: Secondary | ICD-10-CM | POA: Diagnosis not present

## 2021-10-14 DIAGNOSIS — R296 Repeated falls: Secondary | ICD-10-CM | POA: Diagnosis not present

## 2021-10-14 DIAGNOSIS — Z9181 History of falling: Secondary | ICD-10-CM | POA: Diagnosis not present

## 2021-10-14 DIAGNOSIS — H34239 Retinal artery branch occlusion, unspecified eye: Secondary | ICD-10-CM | POA: Diagnosis not present

## 2021-10-14 DIAGNOSIS — K219 Gastro-esophageal reflux disease without esophagitis: Secondary | ICD-10-CM | POA: Diagnosis not present

## 2021-10-14 DIAGNOSIS — E785 Hyperlipidemia, unspecified: Secondary | ICD-10-CM | POA: Diagnosis not present

## 2021-10-14 DIAGNOSIS — Z87891 Personal history of nicotine dependence: Secondary | ICD-10-CM | POA: Diagnosis not present

## 2021-10-14 DIAGNOSIS — J449 Chronic obstructive pulmonary disease, unspecified: Secondary | ICD-10-CM | POA: Diagnosis not present

## 2021-10-14 DIAGNOSIS — Z7951 Long term (current) use of inhaled steroids: Secondary | ICD-10-CM | POA: Diagnosis not present

## 2021-10-14 DIAGNOSIS — D509 Iron deficiency anemia, unspecified: Secondary | ICD-10-CM | POA: Diagnosis not present

## 2021-10-14 DIAGNOSIS — I130 Hypertensive heart and chronic kidney disease with heart failure and stage 1 through stage 4 chronic kidney disease, or unspecified chronic kidney disease: Secondary | ICD-10-CM | POA: Diagnosis not present

## 2021-10-14 DIAGNOSIS — R413 Other amnesia: Secondary | ICD-10-CM | POA: Diagnosis not present

## 2021-10-14 DIAGNOSIS — I5022 Chronic systolic (congestive) heart failure: Secondary | ICD-10-CM | POA: Diagnosis not present

## 2021-10-14 LAB — BASIC METABOLIC PANEL WITH GFR
BUN/Creatinine Ratio: 16 (calc) (ref 6–22)
BUN: 22 mg/dL (ref 7–25)
CO2: 21 mmol/L (ref 20–32)
Calcium: 9.7 mg/dL (ref 8.6–10.4)
Chloride: 109 mmol/L (ref 98–110)
Creat: 1.37 mg/dL — ABNORMAL HIGH (ref 0.60–0.95)
Glucose, Bld: 117 mg/dL — ABNORMAL HIGH (ref 65–99)
Potassium: 4.4 mmol/L (ref 3.5–5.3)
Sodium: 140 mmol/L (ref 135–146)
eGFR: 39 mL/min/{1.73_m2} — ABNORMAL LOW (ref >=60)

## 2021-10-14 LAB — CBC WITH DIFFERENTIAL/PLATELET
Absolute Monocytes: 745 cells/uL (ref 200–950)
Basophils Absolute: 58 cells/uL (ref 0–200)
Basophils Relative: 0.8 %
Eosinophils Absolute: 168 cells/uL (ref 15–500)
Eosinophils Relative: 2.3 %
HCT: 36.5 % (ref 35.0–45.0)
Hemoglobin: 12 g/dL (ref 11.7–15.5)
Lymphs Abs: 1964 cells/uL (ref 850–3900)
MCH: 29.9 pg (ref 27.0–33.0)
MCHC: 32.9 g/dL (ref 32.0–36.0)
MCV: 90.8 fL (ref 80.0–100.0)
MPV: 10.8 fL (ref 7.5–12.5)
Monocytes Relative: 10.2 %
Neutro Abs: 4365 cells/uL (ref 1500–7800)
Neutrophils Relative %: 59.8 %
Platelets: 190 10*3/uL (ref 140–400)
RBC: 4.02 10*6/uL (ref 3.80–5.10)
RDW: 12.7 % (ref 11.0–15.0)
Total Lymphocyte: 26.9 %
WBC: 7.3 10*3/uL (ref 3.8–10.8)

## 2021-10-14 MED ORDER — CLOTRIMAZOLE-BETAMETHASONE 1-0.05 % EX CREA
1.0000 | TOPICAL_CREAM | Freq: Two times a day (BID) | CUTANEOUS | 0 refills | Status: DC
Start: 2021-10-14 — End: 2022-09-15

## 2021-10-14 NOTE — Progress Notes (Signed)
Subjective:    Patient ID: Virginia Walters, female    DOB: April 24, 1941, 81 y.o.   MRN: [id number]  HPI  Admit date: 10/07/2021 Discharge date: 10/09/2021   Admitted From: home  Disposition:  home w/ home health   Recommendations for Outpatient Follow-up:  Follow up with PCP in 1 week Will need  BMP to check Cr/GFR in 1 week F/u w/ nephrology in 1-2 weeks for CKD    Home Health: yes Equipment/Devices:   Discharge Condition: stable  CODE STATUS: full  Diet recommendation: Heart Healthy    Brief/Interim Summary:  HPI was taken from Dr. Blaine Hamper: Virginia Walters is a 81 y.o. female with medical history significant of hypertension, hyperlipidemia, COPD, GERD, PFO, retinal artery occlusion, sCHF with EF of 45-50%, memory loss, anemia, CKD-3a, acute cholecystitis s/p perc cholecystotomy drain 09/17/2021, alcohol abuse in remission, who presents with worsening renal function.   Patient was recently hospitalized from 2/2 - 2/7 due to cholecystitis.  Patient is s/p of posterior PERC procedure. She followed up with her primary care provider who checked labs and it was noted that her creatinine was around 3.4 from baseline of 0.9.  She was told to come to the emergency department. Patient notes she has not been drinking as much is normal because she just is not thirsty.  Patient denies any nausea, vomiting, diarrhea or abdominal pain.  No chest pain, cough, shortness breath.  No fever or chills.  Denies symptoms of UTI. She is chronically incontinent of urine.     Data Reviewed and ED Course: pt was found to have worsening renal function with creatinine 2.91 and BUN 60 (creatinine 0.9 on 09/19/2021), WBC 6.4, magnesium 2.5, phosphorus of 4.5, temperature normal, blood pressure 99/59, heart rate 80, RR 20, oxygen saturation 93-97% on room air.  Patient is placed on MedSurg bed for observation.     As per Dr. Jimmye Norman 2/24-2/25/23: Pt presented w/ AKI on CKDIIIa that improved w/ IVFs and supportive  care. Cr was 1.48 on the day of d/c. Pt denied NSAID use.    Discharge Diagnoses:  Principal Problem:   Acute renal failure superimposed on stage 3a chronic kidney disease (HCC) Active Problems:   HTN (hypertension)   Dyslipidemia   Chronic systolic heart failure (HCC)   Iron deficiency anemia   COPD (chronic obstructive pulmonary disease) (HCC)   Pressure injury of skin   AKI (acute kidney injury) ([place])     AKI on CKDIII: baseline Cr 0.9 on 09/19/2021. Cr is trending down daily.  Recently had PERC cholecystotomy procedure, did not drink fluids as normally she dose. Possibly due to dehydration and continuation of lisinopril. Lisinopril was held throughout hospital stay. Continue on IVFs.    HTN: can restart home dose of lisinopril at d/c    Dyslipidemia: continue on statin    Chronic systolic CHF: echo on 20/25/4270 showed EF 45-50%. Does not seem to have CHF exacerbation. Monitor I/Os.    IDA: continue on iron supplement    COPD : w/o exacerbation. Continue on bronchodilators   Obesity: BMI 34.9. Complicates overall care & prognosis     10/14/21 Cr: 3.38-2.9-1.4 at Discharge.  Patient is here today with her sister for follow-up.  Overall she seems to be back to her baseline.  She looks more energetic.  She denies any chest pain, shortness of breath, cough, or wheezing.  She is compliant in using breztri.  She resumed lisinopril after discharge from the hospital.  I asked  her to hold this for the time being.  Her creatinine was not back to her baseline yet.  I would like to ensure that her kidney function has completely recovered prior to resuming the ACE inhibitor.  She is making a concerted effort to drink more water.  She states that she is "peeing constantly".  She denies any dysuria.  She denies any pain in her calves or in her legs.  There is no peripheral edema in her legs.  She denies any cough.  There is no congestion audible in her lungs.  She has an appointment tomorrow to meet  with interventional radiologist to discuss pulling out the percutaneous cholecystotomy drain.  She has not yet met with general surgeon to discuss a cholecystectomy.  She would like me to arrange this for her in [place] due to convenience with her family as she lives in [place]. Past Medical History:  Diagnosis Date   CHF (congestive heart failure) (HCC)    Dyslipidemia    Frequent falls    GERD (gastroesophageal reflux disease)    HTN (hypertension)    Memory loss    PFO (patent foramen ovale)    Retinal artery occlusion    Weakness    Past Surgical History:  Procedure Laterality Date   ANKLE ARTHROSCOPY WITH OPEN REDUCTION INTERNAL FIXATION (ORIF) Right    APPENDECTOMY     COLONOSCOPY N/A 07/21/2018   Procedure: COLONOSCOPY;  Surgeon: Lin Landsman, MD;  Location: [facility];  Service: Gastroenterology;  Laterality: N/A;   COLONOSCOPY WITH PROPOFOL N/A 04/01/2020   Procedure: COLONOSCOPY WITH PROPOFOL;  Surgeon: Virgel Manifold, MD;  Location: [facility]COPY;  Service: Endoscopy;  Laterality: N/A;   ESOPHAGOGASTRODUODENOSCOPY N/A 07/21/2018   Procedure: ESOPHAGOGASTRODUODENOSCOPY (EGD);  Surgeon: Lin Landsman, MD;  Location: [facility] END[facility]COPY;  Service: Gastroenterology;  Laterality: N/A;   ESOPHAGOGASTRODUODENOSCOPY N/A 02/11/2020   Procedure: ESOPHAGOGASTRODUODENOSCOPY (EGD);  Surgeon: Virgel Manifold, MD;  Location: [facility] ENDOSCOPY;  Service: Endoscopy;  Laterality: N/A;   ESOPHAGOGASTRODUODENOSCOPY (EGD) WITH PROPOFOL N/A 04/01/2020   Procedure: ESOPHAGOGASTRODUODENOSCOPY (EGD) WITH PROPOFOL;  Surgeon: Virgel Manifold, MD;  Location: [facility] ENDOSCOPY;  Service: Endoscopy;  Laterality: N/A;   IR PERC CHOLECYSTOSTOMY  09/17/2021   TUBAL LIGATION     Current Outpatient Medications on File Prior to Visit  Medication Sig Dispense Refill   B Complex-Biotin-FA (B-COMPLEX PO) Take 1 capsule by mouth daily.     Budeson-Glycopyrrol-Formoterol (BREZTRI  AEROSPHERE) 160-9-4.8 MCG/ACT AERO Inhale 2 puffs into the lungs in the morning and at bedtime. 10.7 g 3   Calcium Carbonate-Vitamin D (CALCIUM + D PO) Take 1 capsule by mouth 2 (two) times daily. (Patient not taking: Reported on 10/05/2021)     Cholecalciferol (VITAMIN D) 2000 UNITS CAPS Take 1 capsule by mouth daily.     dorzolamide (TRUSOPT) 2 % ophthalmic solution Place 1 drop into the left eye 2 (two) times daily.     feeding supplement (ENSURE SURGERY) LIQD Take 237 mLs by mouth 2 (two) times daily between meals.     ferrous sulfate 325 (65 FE) MG EC tablet Take 1 tablet (325 mg total) by mouth 2 (two) times daily. 60 tablet 11   fish oil-omega-3 fatty acids 1000 MG capsule Take 1,000 mg by mouth daily.     ipratropium-albuterol (DUONEB) 0.5-2.5 (3) MG/3ML SOLN Take 3 mLs by nebulization every 4 (four) hours as needed (Wheezing or shortness of breath). 360 mL    latanoprost (XALATAN) 0.005 % ophthalmic solution Place  1 drop into the left eye at bedtime.      lisinopril (ZESTRIL) 40 MG tablet TAKE 1 TABLET BY MOUTH  DAILY 90 tablet 3   magnesium 30 MG tablet Take 30 mg by mouth 2 (two) times daily.     melatonin 5 MG TABS Take 5 mg by mouth at bedtime.     Multiple Vitamin (MULTIVITAMIN) tablet Take 1 tablet by mouth daily.     potassium chloride (KLOR-CON) 10 MEQ tablet TAKE 1 TABLET BY MOUTH  DAILY 90 tablet 3   pravastatin (PRAVACHOL) 40 MG tablet TAKE 1 TABLET BY MOUTH  DAILY 90 tablet 3   SYMBICORT 160-4.5 MCG/ACT inhaler USE 2 INHALATIONS BY MOUTH  TWICE DAILY (Patient not taking: Reported on 10/07/2021) 30.6 g 3   traZODone (DESYREL) 50 MG tablet TAKE 1 TABLET BY MOUTH AT  BEDTIME 90 tablet 3   venlafaxine XR (EFFEXOR-XR) 75 MG 24 hr capsule TAKE 1 CAPSULE BY MOUTH  DAILY WITH BREAKFAST (Patient not taking: Reported on 10/07/2021) 90 capsule 3   vitamin E 180 MG (400 UNITS) capsule Take 400 Units by mouth daily.     No current facility-administered medications on file prior to visit.    Allergies  Allergen Reactions   Oxycodone Itching   Social History   Socioeconomic History   Marital status: Widowed    Spouse name: Not on file   Number of children: 1   Years of education: HS   Highest education level: Not on file  Occupational History   Occupation: Retired  Tobacco Use   Smoking status: Former    Packs/day: 1.00    Years: 15.00    Pack years: 15.00    Types: Cigarettes    Quit date: 12/27/1980    Years since quitting: 40.8   Smokeless tobacco: Never  Vaping Use   Vaping Use: Never used  Substance and Sexual Activity   Alcohol use: Yes    Comment: occassionally   Drug use: No   Sexual activity: Not Currently  Other Topics Concern   Not on file  Social History Narrative   Lives at home alone. Husband passed away 10/08/12.   Right-handed.   No daily use of caffeine.   Daughter lives in [place].   Social Determinants of Health   Financial Resource Strain: Low Risk    Difficulty of Paying Living Expenses: Not hard at all  Food Insecurity: No Food Insecurity   Worried About Charity fundraiser in the Last Year: Never true   [place] in the Last Year: Never true  Transportation Needs: No Transportation Needs   Lack of Transportation (Medical): No   Lack of Transportation (Non-Medical): No  Physical Activity: Inactive   Days of Exercise per Week: 0 days   Minutes of Exercise per Session: 0 min  Stress: No Stress Concern Present   Feeling of Stress : Not at all  Social Connections: Moderately Integrated   Frequency of Communication with Friends and Family: Three times a week   Frequency of Social Gatherings with Friends and Family: Three times a week   Attends Religious Services: More than 4 times per year   Active Member of Clubs or Organizations: Yes   Attends Archivist Meetings: More than 4 times per year   Marital Status: Widowed  Intimate Partner Violence: Not At Risk   Fear of Current or Ex-Partner: No   Emotionally  Abused: No   Physically Abused: No   Sexually Abused:  No     Review of Systems  All other systems reviewed and are negative.     Objective:   Physical Exam Constitutional:      General: She is not in acute distress.    Appearance: She is not ill-appearing, toxic-appearing or diaphoretic.  HENT:     Mouth/Throat:     Mouth: Mucous membranes are moist.  Neck:     Vascular: No carotid bruit.  Cardiovascular:     Rate and Rhythm: Normal rate and regular rhythm.     Heart sounds: Normal heart sounds. No murmur heard. Pulmonary:     Effort: Pulmonary effort is normal. No respiratory distress.     Breath sounds: No stridor. No wheezing, rhonchi or rales.  Chest:     Chest wall: No tenderness.  Musculoskeletal:     Right lower leg: No edema.     Left lower leg: No edema.  Neurological:     General: No focal deficit present.     Mental Status: She is alert. Mental status is at baseline.     Sensory: Sensation is intact.     Coordination: Coordination is intact.     Gait: Gait is intact.   Around the site of the cholecystotomy drain, the skin is pink with a serpiginous border.  It appears that she has developed contact dermatitis due to the moisture and the plastic in the adhesives from the dressings.  She also appears to have a little bit of a yeast infection underneath her breast in the exact same area consistent with intertrigo.       Assessment & Plan:  AKI (acute kidney injury) ([place]) - Plan: CBC with Differential/Platelet, BASIC METABOLIC PANEL WITH GFR  Acute renal failure superimposed on stage 3a chronic kidney disease, unspecified acute renal failure type ([place]) - Plan: CBC with Differential/Platelet, BASIC METABOLIC PANEL WITH GFR  Calculus of gallbladder without cholecystitis without obstruction - Plan: Ambulatory referral to General Surgery Recheck renal function today.  I will continue to hold lisinopril until creatinine has returned to baseline around 1.  Blood  pressure is acceptable.  There is no evidence of congestive heart failure or active COPD exacerbation.  Patient is compliant with her breztri.  I will defer management of the cholecystotomy drain to IR with whom she is meeting tomorrow.  I will go ahead and place the order for her to see general surgery as an outpatient to determine the best time to perform a cholecystectomy.  I will treat the intertrigo underneath her right breast with Lotrisone cream twice daily for 10 to 14 days

## 2021-10-15 DIAGNOSIS — K8010 Calculus of gallbladder with chronic cholecystitis without obstruction: Secondary | ICD-10-CM | POA: Diagnosis not present

## 2021-10-16 ENCOUNTER — Other Ambulatory Visit: Payer: Self-pay | Admitting: Family Medicine

## 2021-10-18 ENCOUNTER — Ambulatory Visit (INDEPENDENT_AMBULATORY_CARE_PROVIDER_SITE_OTHER): Payer: Medicare Other | Admitting: *Deleted

## 2021-10-18 DIAGNOSIS — Z7951 Long term (current) use of inhaled steroids: Secondary | ICD-10-CM | POA: Diagnosis not present

## 2021-10-18 DIAGNOSIS — I10 Essential (primary) hypertension: Secondary | ICD-10-CM

## 2021-10-18 DIAGNOSIS — J449 Chronic obstructive pulmonary disease, unspecified: Secondary | ICD-10-CM

## 2021-10-18 DIAGNOSIS — K219 Gastro-esophageal reflux disease without esophagitis: Secondary | ICD-10-CM | POA: Diagnosis not present

## 2021-10-18 DIAGNOSIS — Q2112 Patent foramen ovale: Secondary | ICD-10-CM | POA: Diagnosis not present

## 2021-10-18 DIAGNOSIS — Z934 Other artificial openings of gastrointestinal tract status: Secondary | ICD-10-CM | POA: Diagnosis not present

## 2021-10-18 DIAGNOSIS — Z9181 History of falling: Secondary | ICD-10-CM | POA: Diagnosis not present

## 2021-10-18 DIAGNOSIS — D509 Iron deficiency anemia, unspecified: Secondary | ICD-10-CM | POA: Diagnosis not present

## 2021-10-18 DIAGNOSIS — E785 Hyperlipidemia, unspecified: Secondary | ICD-10-CM | POA: Diagnosis not present

## 2021-10-18 DIAGNOSIS — I130 Hypertensive heart and chronic kidney disease with heart failure and stage 1 through stage 4 chronic kidney disease, or unspecified chronic kidney disease: Secondary | ICD-10-CM | POA: Diagnosis not present

## 2021-10-18 DIAGNOSIS — R296 Repeated falls: Secondary | ICD-10-CM | POA: Diagnosis not present

## 2021-10-18 DIAGNOSIS — I5022 Chronic systolic (congestive) heart failure: Secondary | ICD-10-CM

## 2021-10-18 DIAGNOSIS — N1831 Chronic kidney disease, stage 3a: Secondary | ICD-10-CM | POA: Diagnosis not present

## 2021-10-18 DIAGNOSIS — N179 Acute kidney failure, unspecified: Secondary | ICD-10-CM | POA: Diagnosis not present

## 2021-10-18 DIAGNOSIS — Z87891 Personal history of nicotine dependence: Secondary | ICD-10-CM | POA: Diagnosis not present

## 2021-10-18 DIAGNOSIS — H34239 Retinal artery branch occlusion, unspecified eye: Secondary | ICD-10-CM | POA: Diagnosis not present

## 2021-10-18 DIAGNOSIS — R413 Other amnesia: Secondary | ICD-10-CM | POA: Diagnosis not present

## 2021-10-18 NOTE — Patient Instructions (Signed)
Visit Information   Thank you for taking time to visit with me today. Please don't hesitate to contact me if I can be of assistance to you before our next scheduled telephone appointment.  Following are the goals we discussed today:  Take medications as prescribed   Attend all scheduled provider appointments Perform IADL's (shopping, preparing meals, housekeeping, managing finances) independently Call provider office for new concerns or questions  call office if I gain more than 2 pounds in one day or 5 pounds in one week keep legs up while sitting track weight in diary use salt in moderation watch for swelling in feet, ankles and legs every day weigh myself daily begin a heart failure diary develop a rescue plan identify and remove indoor air pollutants limit outdoor activity during cold weather follow rescue plan if symptoms flare-up develop a new routine to improve sleep practice relaxation or meditation daily check blood pressure weekly choose a place to take my blood pressure (home, clinic or office, retail store) write blood pressure results in a log or diary keep a blood pressure log take blood pressure log to all doctor appointments eat more whole grains, fruits and vegetables, lean meats and healthy fats Look over education mailed - Heart Failure and COPD action plan, low sodium diet RN care manager will contact you back on 10/22/21 at 1230 pm to finish initial assessment over the telephone  Our next appointment is by telephone on 10/22/21 at 1045 am  Please call the care guide team at [phone] if you need to cancel or reschedule your appointment.   If you are experiencing a Mental Health or [place] or need someone to talk to, please call the Suicide and Crisis Lifeline: 988 call the [place] National Suicide Prevention Lifeline: 502-374-2226 or TTY: 8020684957 TTY 223-692-9021) to talk to a trained counselor call 1-800-273-TALK (toll free, 24 hour  hotline) go to [facility] [address], [place] [phone]) call 911   Following is a copy of your full care plan:  Care Plan : RN Care Manager Plan of Care  Updates made by Kassie Mends, RN since 10/18/2021 12:00 AM     Problem: No plan of care established for management of chronic disease state  (CHF, COPD, HTN)   Priority: High     Long-Range Goal: Development of plan of care for chronic disease management  (CHF, COPD, HTN)   Start Date: 10/18/2021  Expected End Date: 04/16/2022  Priority: High  Note:   Current Barriers:  Knowledge Deficits related to plan of care for management of CHF, HTN, and COPD  Patient reports she lives alone, has hired help that cooks dinner for her each evening, has friends that assist with transportation, getting groceries, etc as pt does not drive.  Patient recently discharged from SNF after hospitalization 2/23-2/25 from AKI thought to be caused by not drinking enough fluids.  Patient has percutaneous cholecystostomy tube that is functional and draining bile daily, pt has cholelithiasis and may be having lap cholecystectomy in the future. Pt currently has [facility] and pt reports physical therapist is at her door and she needs to hang up, RN care manager was unable to complete the entire assessment today and will call back later this week. Patient reports she does not have CHF and has not been instructed to weigh. Patient reports she does not have Duoneb medication for nebulizer and "never been told to use this" Patient reports she has blood pressure  cuff but does not monitor blood pressure.   RNCM Clinical Goal(s):  Patient will verbalize understanding of plan for management of CHF, HTN, and COPD as evidenced by patient report, review of EHR and   through collaboration with RN Care manager, provider, and care team.   Interventions: 1:1 collaboration with primary care provider regarding development and  update of comprehensive plan of care as evidenced by provider attestation and co-signature Inter-disciplinary care team collaboration (see longitudinal plan of care) Evaluation of current treatment plan related to  self management and patient's adherence to plan as established by provider  Current Barriers:  Knowledge Deficits related to plan of care for management of CHF, HTN, and COPD   RNCM Clinical Goal(s):  Patient will verbalize understanding of plan for management of CHF, HTN, and COPD as evidenced by patient report, review of EHR and  through collaboration with RN Care manager, provider, and care team.   Interventions: 1:1 collaboration with primary care provider regarding development and update of comprehensive plan of care as evidenced by provider attestation and co-signature Inter-disciplinary care team collaboration (see longitudinal plan of care) Evaluation of current treatment plan related to  self management and patient's adherence to plan as established by provider   Heart Failure Interventions:  (Status:  New goal. and Goal on track:  Yes.) Long Term Goal Basic overview and discussion of pathophysiology of Heart Failure reviewed Provided education on low sodium diet Reviewed Heart Failure Action Plan in depth and provided written copy Screening for signs and symptoms of depression related to chronic disease state  Assessed social determinant of health barriers  Talked with patient about heart failure diagnosis (pt states she does not have this condition and does not weight) Pain assessment completed Mailed to patient- Heart Failure action plan  COPD Interventions:  (Status:  New goal. and Goal on track:  Yes.) Long Term Goal Provided patient with basic written and verbal COPD education on self care/management/and exacerbation prevention Advised patient to self assesses COPD action plan zone and make appointment with provider if in the yellow zone for 48 hours without  improvement Advised patient to engage in light exercise as tolerated 3-5 days a week to aid in the the management of COPD Mailed to patient- COPD action plan   Hypertension Interventions:  (Status:  New goal. and Goal on track:  Yes.) Long Term Goal Last practice recorded BP readings:  BP Readings from Last 3 Encounters:  10/14/21 138/82  10/09/21 (!) 142/64  10/05/21 128/76  Most recent eGFR/CrCl:  Lab Results  Component Value Date   EGFR 39 (L) 10/14/2021    No components found for: CRCL  Evaluation of current treatment plan related to hypertension self management and patient's adherence to plan as established by provider Reviewed medications with patient and discussed importance of compliance Discussed plans with patient for ongoing care management follow up and provided patient with direct contact information for care management team Advised patient, providing education and rationale, to monitor blood pressure daily and record, calling PCP for findings outside established parameters  Mailed to patient- low sodium diet    Patient Goals/Self-Care Activities: Take medications as prescribed   Attend all scheduled provider appointments Perform IADL's (shopping, preparing meals, housekeeping, managing finances) independently Call provider office for new concerns or questions  call office if I gain more than 2 pounds in one day or 5 pounds in one week keep legs up while sitting track weight in diary use salt in moderation watch for swelling  in feet, ankles and legs every day weigh myself daily begin a heart failure diary develop a rescue plan identify and remove indoor air pollutants limit outdoor activity during cold weather follow rescue plan if symptoms flare-up develop a new routine to improve sleep practice relaxation or meditation daily check blood pressure weekly choose a place to take my blood pressure (home, clinic or office, retail store) write blood pressure  results in a log or diary keep a blood pressure log take blood pressure log to all doctor appointments eat more whole grains, fruits and vegetables, lean meats and healthy fats Look over education mailed - Heart Failure and COPD action plan, low sodium diet RN care manager will contact you back on 10/22/21 at 1045 am to finish initial assessment over the telephone       Consent to CCM Services: Ms. Mackowiak was given information about Chronic Care Management services including:  CCM service includes personalized support from designated clinical staff supervised by her physician, including individualized plan of care and coordination with other care providers 24/7 contact phone numbers for assistance for urgent and routine care needs. Service will only be billed when office clinical staff spend 20 minutes or more in a month to coordinate care. Only one practitioner may furnish and bill the service in a calendar month. The patient may stop CCM services at any time (effective at the end of the month) by phone call to the office staff. The patient will be responsible for cost sharing (co-pay) of up to 20% of the service fee (after annual deductible is met).  Patient agreed to services and verbal consent obtained.   The patient verbalized understanding of instructions, educational materials, and care plan provided today and agreed to receive a mailed copy of patient instructions, educational materials, and care plan.   Telephone follow up appointment with care management team member scheduled for:  10/22/21  DASH Eating Plan DASH stands for Dietary Approaches to Stop Hypertension. The DASH eating plan is a healthy eating plan that has been shown to: Reduce high blood pressure (hypertension). Reduce your risk for type 2 diabetes, heart disease, and stroke. Help with weight loss. What are tips for following this plan? Reading food labels Check food labels for the amount of salt (sodium) per  serving. Choose foods with less than 5 percent of the Daily Value of sodium. Generally, foods with less than 300 milligrams (mg) of sodium per serving fit into this eating plan. To find whole grains, look for the word "whole" as the first word in the ingredient list. Shopping Buy products labeled as "low-sodium" or "no salt added." Buy fresh foods. Avoid canned foods and pre-made or frozen meals. Cooking Avoid adding salt when cooking. Use salt-free seasonings or herbs instead of table salt or sea salt. Check with your health care provider or pharmacist before using salt substitutes. Do not fry foods. Cook foods using healthy methods such as baking, boiling, grilling, roasting, and broiling instead. Cook with heart-healthy oils, such as olive, canola, avocado, soybean, or sunflower oil. Meal planning  Eat a balanced diet that includes: 4 or more servings of fruits and 4 or more servings of vegetables each day. Try to fill one-half of your plate with fruits and vegetables. 6-8 servings of whole grains each day. Less than 6 oz (170 g) of lean meat, poultry, or fish each day. A 3-oz (85-g) serving of meat is about the same size as a deck of cards. One egg equals 1 oz (28  g). 2-3 servings of low-fat dairy each day. One serving is 1 cup (237 mL). 1 serving of nuts, seeds, or beans 5 times each week. 2-3 servings of heart-healthy fats. Healthy fats called omega-3 fatty acids are found in foods such as walnuts, flaxseeds, fortified milks, and eggs. These fats are also found in cold-water fish, such as sardines, salmon, and mackerel. Limit how much you eat of: Canned or prepackaged foods. Food that is high in trans fat, such as some fried foods. Food that is high in saturated fat, such as fatty meat. Desserts and other sweets, sugary drinks, and other foods with added sugar. Full-fat dairy products. Do not salt foods before eating. Do not eat more than 4 egg yolks a week. Try to eat at least 2  vegetarian meals a week. Eat more home-cooked food and less restaurant, buffet, and fast food. Lifestyle When eating at a restaurant, ask that your food be prepared with less salt or no salt, if possible. If you drink alcohol: Limit how much you use to: 0-1 drink a day for women who are not pregnant. 0-2 drinks a day for men. Be aware of how much alcohol is in your drink. In the [place] one drink equals one 12 oz bottle of beer (355 mL), one 5 oz glass of wine (148 mL), or one 1 oz glass of hard liquor (44 mL). General information Avoid eating more than 2,300 mg of salt a day. If you have hypertension, you may need to reduce your sodium intake to 1,500 mg a day. Work with your health care provider to maintain a healthy body weight or to lose weight. Ask what an ideal weight is for you. Get at least 30 minutes of exercise that causes your heart to beat faster (aerobic exercise) most days of the week. Activities may include walking, swimming, or biking. Work with your health care provider or dietitian to adjust your eating plan to your individual calorie needs. What foods should I eat? Fruits All fresh, dried, or frozen fruit. Canned fruit in natural juice (without added sugar). Vegetables Fresh or frozen vegetables (raw, steamed, roasted, or grilled). Low-sodium or reduced-sodium tomato and vegetable juice. Low-sodium or reduced-sodium tomato sauce and tomato paste. Low-sodium or reduced-sodium canned vegetables. Grains Whole-grain or whole-wheat bread. Whole-grain or whole-wheat pasta. Brown rice. [place]. Bulgur. Whole-grain and low-sodium cereals. Pita bread. Low-fat, low-sodium crackers. Whole-wheat flour tortillas. Meats and other proteins Skinless chicken or [place]. Ground chicken or [place]. Pork with fat trimmed off. Fish and seafood. Egg whites. Dried beans, peas, or lentils. Unsalted nuts, nut butters, and seeds. Unsalted canned beans. Lean cuts of beef with fat trimmed off.  Low-sodium, lean precooked or cured meat, such as sausages or meat loaves. Dairy Low-fat (1%) or fat-free (skim) milk. Reduced-fat, low-fat, or fat-free cheeses. Nonfat, low-sodium ricotta or cottage cheese. Low-fat or nonfat yogurt. Low-fat, low-sodium cheese. Fats and oils Soft margarine without trans fats. Vegetable oil. Reduced-fat, low-fat, or light mayonnaise and salad dressings (reduced-sodium). Canola, safflower, olive, avocado, soybean, and sunflower oils. Avocado. Seasonings and condiments Herbs. Spices. Seasoning mixes without salt. Other foods Unsalted popcorn and pretzels. Fat-free sweets. The items listed above may not be a complete list of foods and beverages you can eat. Contact a dietitian for more information. What foods should I avoid? Fruits Canned fruit in a light or heavy syrup. Fried fruit. Fruit in cream or butter sauce. Vegetables Creamed or fried vegetables. Vegetables in a cheese sauce. Regular canned vegetables (not low-sodium or  reduced-sodium). Regular canned tomato sauce and paste (not low-sodium or reduced-sodium). Regular tomato and vegetable juice (not low-sodium or reduced-sodium). Angie Fava. Olives. Grains Baked goods made with fat, such as croissants, muffins, or some breads. Dry pasta or rice meal packs. Meats and other proteins Fatty cuts of meat. Ribs. Fried meat. Berniece Salines. Bologna, salami, and other precooked or cured meats, such as sausages or meat loaves. Fat from the back of a pig (fatback). Bratwurst. Salted nuts and seeds. Canned beans with added salt. Canned or smoked fish. Whole eggs or egg yolks. Chicken or [place] with skin. Dairy Whole or 2% milk, cream, and half-and-half. Whole or full-fat cream cheese. Whole-fat or sweetened yogurt. Full-fat cheese. Nondairy creamers. Whipped toppings. Processed cheese and cheese spreads. Fats and oils Butter. Stick margarine. Lard. Shortening. Ghee. Bacon fat. Tropical oils, such as coconut, palm kernel, or palm  oil. Seasonings and condiments Onion salt, garlic salt, seasoned salt, table salt, and sea salt. Worcestershire sauce. Tartar sauce. Barbecue sauce. Teriyaki sauce. Soy sauce, including reduced-sodium. Steak sauce. Canned and packaged gravies. Fish sauce. Oyster sauce. Cocktail sauce. Store-bought horseradish. Ketchup. Mustard. Meat flavorings and tenderizers. Bouillon cubes. Hot sauces. Pre-made or packaged marinades. Pre-made or packaged taco seasonings. Relishes. Regular salad dressings. Other foods Salted popcorn and pretzels. The items listed above may not be a complete list of foods and beverages you should avoid. Contact a dietitian for more information. Where to find more information National Heart, Lung, and Blood Institute: https://wilson-eaton.com/ American Heart Association: www.heart.org Academy of Nutrition and Dietetics: www.eatright.[place]: www.kidney.org Summary The DASH eating plan is a healthy eating plan that has been shown to reduce high blood pressure (hypertension). It may also reduce your risk for type 2 diabetes, heart disease, and stroke. When on the DASH eating plan, aim to eat more fresh fruits and vegetables, whole grains, lean proteins, low-fat dairy, and heart-healthy fats. With the DASH eating plan, you should limit salt (sodium) intake to 2,300 mg a day. If you have hypertension, you may need to reduce your sodium intake to 1,500 mg a day. Work with your health care provider or dietitian to adjust your eating plan to your individual calorie needs. This information is not intended to replace advice given to you by your health care provider. Make sure you discuss any questions you have with your health care provider. Document Revised: 07/05/2019 Document Reviewed: 07/05/2019 Elsevier Patient Education  2022 [place]. Heart Failure Action Plan A heart failure action plan helps you understand what to do when you have symptoms of heart failure.  Your action plan is a color-coded plan that lists the symptoms to watch for and indicates what actions to take. If you have symptoms in the red zone, you need medical care right away. If you have symptoms in the yellow zone, you are having problems. If you have symptoms in the green zone, you are doing well. Follow the plan that was created by you and your health care provider. Review your plan each time you visit your health care provider. Red zone These signs and symptoms mean you should get medical help right away: You have trouble breathing when resting. You have a dry cough that is getting worse. You have swelling or pain in your legs or abdomen that is getting worse. You suddenly gain more than 2-3 lb (0.9-1.4 kg) in 24 hours, or more than 5 lb (2.3 kg) in a week. This amount may be more or less depending on your condition. You have  trouble staying awake or you feel confused. You have chest pain. You do not have an appetite. You pass out. You have worsening sadness or depression. If you have any of these symptoms, call your local emergency services (911 in the [place] right away. Do not drive yourself to the hospital. Yellow zone These signs and symptoms mean your condition may be getting worse and you should make some changes: You have trouble breathing when you are active, or you need to sleep with your head raised on extra pillows to help you breathe. You have swelling in your legs or abdomen. You gain 2-3 lb (0.9-1.4 kg) in 24 hours, or 5 lb (2.3 kg) in a week. This amount may be more or less depending on your condition. You get tired easily. You have trouble sleeping. You have a dry cough. If you have any of these symptoms: Contact your health care provider within the next day. Your health care provider may adjust your medicines. Green zone These signs mean you are doing well and can continue what you are doing: You do not have shortness of breath. You have very little  swelling or no new swelling. Your weight is stable (no gain or loss). You have a normal activity level. You do not have chest pain or any other new symptoms. Follow these instructions at home: Take over-the-counter and prescription medicines only as told by your health care provider. Weigh yourself daily. Your target weight is __________ lb (__________ kg). Call your health care provider if you gain more than __________ lb (__________ kg) in 24 hours, or more than __________ lb (__________ kg) in a week. Health care provider name: _____________________________________________________ Health care provider phone number: _____________________________________________________ Eat a heart-healthy diet. Work with a diet and nutrition specialist (dietitian) to create an eating plan that is best for you. Keep all follow-up visits. This is important. Where to find more information American Heart Association: www.heart.org Summary A heart failure action plan helps you understand what to do when you have symptoms of heart failure. Follow the action plan that was created by you and your health care provider. Get help right away if you have any symptoms in the red zone. This information is not intended to replace advice given to you by your health care provider. Make sure you discuss any questions you have with your health care provider. Document Revised: 03/16/2020 Document Reviewed: 03/16/2020 Elsevier Patient Education  2022 [place]. COPD Action Plan A COPD action plan is a description of what to do when you have a flare (exacerbation) of chronic obstructive pulmonary disease (COPD). Your action plan is a color-coded plan that lists the symptoms that indicate whether your condition is under control and what actions to take. If you have symptoms in the green zone, it means you are doing well that day. If you have symptoms in the yellow zone, it means you are having a bad day or an exacerbation. If  you have symptoms in the red zone, you need urgent medical care. Follow the plan that you and your health care provider developed. Review your plan with your health care provider at each visit. Red zone Symptoms in this zone mean that you should get medical help right away. They include: Feeling very short of breath, even when you are resting. Not being able to do any activities because of poor breathing. Not being able to sleep because of poor breathing. Fever or shaking chills. Feeling confused or very sleepy. Chest pain. Coughing up blood.  If you have any of these symptoms, call emergency services (911 in the [place] or go to the nearest emergency room. Yellow zone Symptoms in this zone mean that your condition may be getting worse. They include: Feeling more short of breath than usual. Having less energy for daily activities than usual. Phlegm or mucus that is thicker than usual. Needing to use your rescue inhaler or nebulizer more often than usual. More ankle swelling than usual. Coughing more than usual. Feeling like you have a chest cold. Trouble sleeping due to COPD symptoms. Decreased appetite. COPD medicines not helping as much as usual. If you experience any "yellow" symptoms: Keep taking your daily medicines as directed. Use your quick-relief inhaler as told by your health care provider. If you were prescribed steroid medicine to take by mouth (oral medicine), start taking it as told by your health care provider. If you were prescribed an antibiotic medicine, start taking it as told by your health care provider. Do not stop taking the antibiotic even if you start to feel better. Use oxygen as told by your health care provider. Get more rest. Do your pursed-lip breathing exercises. Do not smoke. Avoid any irritants in the air. If your signs and symptoms do not improve after taking these steps, call your health care provider right away. Green zone Symptoms in this zone mean  that you are doing well. They include: Being able to do your usual activities and exercise. Having the usual amount of coughing, including the same amount of phlegm or mucus. Being able to sleep well. Having a good appetite. Where to find more information: You can find more information about COPD from: American Lung Association, My COPD Action Plan: www.lung.org COPD Foundation: www.copdfoundation.[place]: https://wilson-eaton.com/ Follow these instructions at home: Continue taking your daily medicines as told by your health care provider. Make sure you receive all the immunizations that your health care provider recommends, especially the pneumococcal and influenza vaccines. Wash your hands often with soap and water. Have family members wash their hands too. Regular hand washing can help prevent infections. Follow your usual exercise and diet plan. Avoid irritants in the air, such as smoke. Do not use any products that contain nicotine or tobacco. These products include cigarettes, chewing tobacco, and vaping devices, such as e-cigarettes. If you need help quitting, ask your health care provider. Summary A COPD action plan tells you what to do when you have a flare (exacerbation) of chronic obstructive pulmonary disease (COPD). Follow each action plan for your symptoms. If you have any symptoms in the red zone, call emergency services (911 in the [place] or go to the nearest emergency room. This information is not intended to replace advice given to you by your health care provider. Make sure you discuss any questions you have with your health care provider. Document Revised: 06/09/2020 Document Reviewed: 06/09/2020 Elsevier Patient Education  2022 [employer].

## 2021-10-18 NOTE — Chronic Care Management (AMB) (Signed)
Chronic Care Management   CCM RN Visit Note  10/18/2021 Name: Virginia Walters MRN: [id number] DOB: July 25, 1941  Subjective: Virginia Walters is a 81 y.o. year old female who is a primary care patient of Pickard, Cammie Mcgee, MD. The care management team was consulted for assistance with disease management and care coordination needs.    Engaged with patient by telephone for initial visit in response to provider referral for case management and/or care coordination services.   Consent to Services:  The patient was given the following information about Chronic Care Management services today, agreed to services, and gave verbal consent: 1. CCM service includes personalized support from designated clinical staff supervised by the primary care provider, including individualized plan of care and coordination with other care providers 2. 24/7 contact phone numbers for assistance for urgent and routine care needs. 3. Service will only be billed when office clinical staff spend 20 minutes or more in a month to coordinate care. 4. Only one practitioner may furnish and bill the service in a calendar month. 5.The patient may stop CCM services at any time (effective at the end of the month) by phone call to the office staff. 6. The patient will be responsible for cost sharing (co-pay) of up to 20% of the service fee (after annual deductible is met). Patient agreed to services and consent obtained.  Patient agreed to services and verbal consent obtained.   Assessment: Review of patient past medical history, allergies, medications, health status, including review of consultants reports, laboratory and other test data, was performed as part of comprehensive evaluation and provision of chronic care management services.   SDOH (Social Determinants of Health) assessments and interventions performed:  SDOH Interventions    Flowsheet Row Most Recent Value  SDOH Interventions   Food Insecurity Interventions  Intervention Not Indicated  Transportation Interventions Intervention Not Indicated  Depression Interventions/Treatment  Medication        CCM Care Plan  Allergies  Allergen Reactions   Oxycodone Itching    Outpatient Encounter Medications as of 10/18/2021  Medication Sig Note   B Complex-Biotin-FA (B-COMPLEX PO) Take 1 capsule by mouth daily.    Budeson-Glycopyrrol-Formoterol (BREZTRI AEROSPHERE) 160-9-4.8 MCG/ACT AERO Inhale 2 puffs into the lungs in the morning and at bedtime.    Calcium Carbonate-Vitamin D (CALCIUM + D PO) Take 1 capsule by mouth 2 (two) times daily.    Cholecalciferol (VITAMIN D) 2000 UNITS CAPS Take 1 capsule by mouth daily.    clotrimazole-betamethasone (LOTRISONE) cream Apply 1 application topically 2 (two) times daily.    dorzolamide (TRUSOPT) 2 % ophthalmic solution Place 1 drop into the left eye 2 (two) times daily.    feeding supplement (ENSURE SURGERY) LIQD Take 237 mLs by mouth 2 (two) times daily between meals.    fish oil-omega-3 fatty acids 1000 MG capsule Take 1,000 mg by mouth daily.    latanoprost (XALATAN) 0.005 % ophthalmic solution Place 1 drop into the left eye at bedtime.  07/22/2021: LF 04/12/2021 90 day   lisinopril (ZESTRIL) 40 MG tablet TAKE 1 TABLET BY MOUTH  DAILY    magnesium 30 MG tablet Take 30 mg by mouth 2 (two) times daily.    melatonin 5 MG TABS Take 5 mg by mouth at bedtime.    Multiple Vitamin (MULTIVITAMIN) tablet Take 1 tablet by mouth daily.    potassium chloride (KLOR-CON) 10 MEQ tablet TAKE 1 TABLET BY MOUTH  DAILY    pravastatin (PRAVACHOL) 40 MG tablet TAKE 1  TABLET BY MOUTH  DAILY    SYMBICORT 160-4.5 MCG/ACT inhaler USE 2 INHALATIONS BY MOUTH  TWICE DAILY    traZODone (DESYREL) 50 MG tablet TAKE 1 TABLET BY MOUTH AT  BEDTIME    venlafaxine XR (EFFEXOR-XR) 75 MG 24 hr capsule TAKE 1 CAPSULE BY MOUTH  DAILY WITH BREAKFAST    vitamin E 180 MG (400 UNITS) capsule Take 400 Units by mouth daily.    ferrous sulfate 325 (65 FE)  MG EC tablet Take 1 tablet (325 mg total) by mouth 2 (two) times daily.    ipratropium-albuterol (DUONEB) 0.5-2.5 (3) MG/3ML SOLN Take 3 mLs by nebulization every 4 (four) hours as needed (Wheezing or shortness of breath). (Patient not taking: Reported on 10/18/2021)    [DISCONTINUED] potassium chloride (KLOR-CON) 10 MEQ tablet TAKE 1 TABLET BY MOUTH  DAILY    No facility-administered encounter medications on file as of 10/18/2021.    Patient Active Problem List   Diagnosis Date Noted   Pressure injury of skin 10/08/2021   AKI (acute kidney injury) ([place]) 10/08/2021   Acute renal failure superimposed on stage 3a chronic kidney disease ([place]) 10/07/2021   COPD (chronic obstructive pulmonary disease) ([place]) 10/07/2021   Acute on chronic respiratory failure with hypoxia ([place]) 09/21/2021   Insomnia 09/18/2021   Acute cholecystitis s/p perc cholecystotomy drain 09/17/2021 09/16/2021   Chronic kidney disease, stage 3a ([place]) 09/16/2021   Iron deficiency anemia    Gastric polyp    Stomach irritation    Ectopic gastric mucosa    Polyp of colon    Angiodysplasia of intestinal tract    COPD with chronic bronchitis ([place]) 02/10/2020   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, stage 3a ([place]) 02/10/2020   COPD exacerbation ([place]) 73/41/9379   Chronic systolic heart failure ([place]) 08/29/2018   Acute on chronic heart failure with preserved ejection fraction ([facility])    Symptomatic anemia 07/20/2018   Dementia with behavioral problem 10/26/2016   Alcohol abuse 10/26/2016   Memory loss 10/12/2016   Gait abnormality 10/12/2016   Orthostatic dizziness 10/12/2016   Paresthesia 10/12/2016   Abnormal EKG 12/28/2010   HTN (hypertension)    Dyslipidemia    PFO (patent foramen ovale)    Retinal artery occlusion    GERD (gastroesophageal reflux disease)     Conditions to be addressed/monitored:CHF, HTN, and COPD  Care Plan : RN Care Manager Plan of Care  Updates made by Kassie Mends, RN since 10/18/2021 12:00 AM      Problem: No plan of care established for management of chronic disease state  (CHF, COPD, HTN)   Priority: High     Long-Range Goal: Development of plan of care for chronic disease management  (CHF, COPD, HTN)   Start Date: 10/18/2021  Expected End Date: 04/16/2022  Priority: High  Note:   Current Barriers:  Knowledge Deficits related to plan of care for management of CHF, HTN, and COPD  Patient reports she lives alone, has hired help that cooks dinner for her each evening, has friends that assist with transportation, getting groceries, etc as pt does not drive.  Patient recently discharged from SNF after hospitalization 2/23-2/25 from AKI thought to be caused by not drinking enough fluids.  Patient has percutaneous cholecystostomy tube that is functional and draining bile daily, pt has cholelithiasis and may be having lap cholecystectomy in the future. Pt currently has [facility] and pt reports physical therapist is at her door and she needs to hang up, RN care  manager was unable to complete the entire assessment today and will call back later this week. Patient reports she does not have CHF and has not been instructed to weigh. Patient reports she does not have Duoneb medication for nebulizer and "never been told to use this" Patient reports she has blood pressure cuff but does not monitor blood pressure.   RNCM Clinical Goal(s):  Patient will verbalize understanding of plan for management of CHF, HTN, and COPD as evidenced by patient report, review of EHR and   through collaboration with RN Care manager, provider, and care team.   Interventions: 1:1 collaboration with primary care provider regarding development and update of comprehensive plan of care as evidenced by provider attestation and co-signature Inter-disciplinary care team collaboration (see longitudinal plan of care) Evaluation of current treatment plan related to  self management and patient's adherence to plan as  established by provider  Current Barriers:  Knowledge Deficits related to plan of care for management of CHF, HTN, and COPD   RNCM Clinical Goal(s):  Patient will verbalize understanding of plan for management of CHF, HTN, and COPD as evidenced by patient report, review of EHR and  through collaboration with RN Care manager, provider, and care team.   Interventions: 1:1 collaboration with primary care provider regarding development and update of comprehensive plan of care as evidenced by provider attestation and co-signature Inter-disciplinary care team collaboration (see longitudinal plan of care) Evaluation of current treatment plan related to  self management and patient's adherence to plan as established by provider   Heart Failure Interventions:  (Status:  New goal. and Goal on track:  Yes.) Long Term Goal Basic overview and discussion of pathophysiology of Heart Failure reviewed Provided education on low sodium diet Reviewed Heart Failure Action Plan in depth and provided written copy Screening for signs and symptoms of depression related to chronic disease state  Assessed social determinant of health barriers  Talked with patient about heart failure diagnosis (pt states she does not have this condition and does not weight) Pain assessment completed Mailed to patient- Heart Failure action plan  COPD Interventions:  (Status:  New goal. and Goal on track:  Yes.) Long Term Goal Provided patient with basic written and verbal COPD education on self care/management/and exacerbation prevention Advised patient to self assesses COPD action plan zone and make appointment with provider if in the yellow zone for 48 hours without improvement Advised patient to engage in light exercise as tolerated 3-5 days a week to aid in the the management of COPD Mailed to patient- COPD action plan   Hypertension Interventions:  (Status:  New goal. and Goal on track:  Yes.) Long Term Goal Last practice  recorded BP readings:  BP Readings from Last 3 Encounters:  10/14/21 138/82  10/09/21 (!) 142/64  10/05/21 128/76  Most recent eGFR/CrCl:  Lab Results  Component Value Date   EGFR 39 (L) 10/14/2021    No components found for: CRCL  Evaluation of current treatment plan related to hypertension self management and patient's adherence to plan as established by provider Reviewed medications with patient and discussed importance of compliance Discussed plans with patient for ongoing care management follow up and provided patient with direct contact information for care management team Advised patient, providing education and rationale, to monitor blood pressure daily and record, calling PCP for findings outside established parameters  Mailed to patient- low sodium diet    Patient Goals/Self-Care Activities: Take medications as prescribed   Attend all scheduled provider  appointments Perform IADL's (shopping, preparing meals, housekeeping, managing finances) independently Call provider office for new concerns or questions  call office if I gain more than 2 pounds in one day or 5 pounds in one week keep legs up while sitting track weight in diary use salt in moderation watch for swelling in feet, ankles and legs every day weigh myself daily begin a heart failure diary develop a rescue plan identify and remove indoor air pollutants limit outdoor activity during cold weather follow rescue plan if symptoms flare-up develop a new routine to improve sleep practice relaxation or meditation daily check blood pressure weekly choose a place to take my blood pressure (home, clinic or office, retail store) write blood pressure results in a log or diary keep a blood pressure log take blood pressure log to all doctor appointments eat more whole grains, fruits and vegetables, lean meats and healthy fats Look over education mailed - Heart Failure and COPD action plan, low sodium diet RN care  manager will contact you back on 10/22/21 at 1045 am to finish initial assessment over the telephone       Plan:Telephone follow up appointment with care management team member scheduled for:  10/22/21 to complete initial assessment   Jacqlyn Larsen [facility], BSN RN Case Manager [facility] [phone]

## 2021-10-20 DIAGNOSIS — I5022 Chronic systolic (congestive) heart failure: Secondary | ICD-10-CM | POA: Diagnosis not present

## 2021-10-20 DIAGNOSIS — Z7951 Long term (current) use of inhaled steroids: Secondary | ICD-10-CM | POA: Diagnosis not present

## 2021-10-20 DIAGNOSIS — N179 Acute kidney failure, unspecified: Secondary | ICD-10-CM | POA: Diagnosis not present

## 2021-10-20 DIAGNOSIS — Q2112 Patent foramen ovale: Secondary | ICD-10-CM | POA: Diagnosis not present

## 2021-10-20 DIAGNOSIS — E785 Hyperlipidemia, unspecified: Secondary | ICD-10-CM | POA: Diagnosis not present

## 2021-10-20 DIAGNOSIS — R296 Repeated falls: Secondary | ICD-10-CM | POA: Diagnosis not present

## 2021-10-20 DIAGNOSIS — J449 Chronic obstructive pulmonary disease, unspecified: Secondary | ICD-10-CM | POA: Diagnosis not present

## 2021-10-20 DIAGNOSIS — N1831 Chronic kidney disease, stage 3a: Secondary | ICD-10-CM | POA: Diagnosis not present

## 2021-10-20 DIAGNOSIS — K219 Gastro-esophageal reflux disease without esophagitis: Secondary | ICD-10-CM | POA: Diagnosis not present

## 2021-10-20 DIAGNOSIS — H34239 Retinal artery branch occlusion, unspecified eye: Secondary | ICD-10-CM | POA: Diagnosis not present

## 2021-10-20 DIAGNOSIS — D509 Iron deficiency anemia, unspecified: Secondary | ICD-10-CM | POA: Diagnosis not present

## 2021-10-20 DIAGNOSIS — I130 Hypertensive heart and chronic kidney disease with heart failure and stage 1 through stage 4 chronic kidney disease, or unspecified chronic kidney disease: Secondary | ICD-10-CM | POA: Diagnosis not present

## 2021-10-20 DIAGNOSIS — Z9181 History of falling: Secondary | ICD-10-CM | POA: Diagnosis not present

## 2021-10-20 DIAGNOSIS — Z934 Other artificial openings of gastrointestinal tract status: Secondary | ICD-10-CM | POA: Diagnosis not present

## 2021-10-20 DIAGNOSIS — Z87891 Personal history of nicotine dependence: Secondary | ICD-10-CM | POA: Diagnosis not present

## 2021-10-20 DIAGNOSIS — R413 Other amnesia: Secondary | ICD-10-CM | POA: Diagnosis not present

## 2021-10-22 ENCOUNTER — Other Ambulatory Visit: Payer: Self-pay

## 2021-10-22 ENCOUNTER — Ambulatory Visit (HOSPITAL_COMMUNITY)
Admission: RE | Admit: 2021-10-22 | Discharge: 2021-10-22 | Disposition: A | Discharge status: Home / Self Care | Payer: Medicare Other | Source: Ambulatory Visit | Attending: Radiology | Admitting: Radiology

## 2021-10-22 ENCOUNTER — Telehealth: Payer: Medicare Other

## 2021-10-22 ENCOUNTER — Other Ambulatory Visit (HOSPITAL_COMMUNITY): Payer: Self-pay | Admitting: Radiology

## 2021-10-22 ENCOUNTER — Telehealth: Payer: Self-pay | Admitting: *Deleted

## 2021-10-22 ENCOUNTER — Other Ambulatory Visit (HOSPITAL_COMMUNITY): Payer: Self-pay | Admitting: Interventional Radiology

## 2021-10-22 DIAGNOSIS — K819 Cholecystitis, unspecified: Secondary | ICD-10-CM

## 2021-10-22 DIAGNOSIS — K8010 Calculus of gallbladder with chronic cholecystitis without obstruction: Secondary | ICD-10-CM | POA: Insufficient documentation

## 2021-10-22 DIAGNOSIS — Z434 Encounter for attention to other artificial openings of digestive tract: Secondary | ICD-10-CM | POA: Diagnosis not present

## 2021-10-22 HISTORY — PX: IR CHOLANGIOGRAM EXISTING TUBE: IMG6040

## 2021-10-22 MED ORDER — LIDOCAINE HCL 1 % IJ SOLN
INTRAMUSCULAR | Status: AC
  Filled 2021-10-22: qty 20

## 2021-10-22 MED ORDER — IOHEXOL 300 MG/ML  SOLN
50.0000 mL | Freq: Once | INTRAMUSCULAR | Status: AC | PRN
  Administered 2021-10-22: 15 mL

## 2021-10-22 NOTE — Telephone Encounter (Signed)
?  Care Management  ? ?Follow Up Note ? ? ?10/22/2021 ?Name: Virginia Walters MRN: [id number] DOB: 09-14-1940 ? ? ?Referred by: Susy Frizzle, MD ?Reason for referral : Chronic Care Management (CHF, COPD) ? ? ?An unsuccessful telephone outreach was attempted today. The patient was referred to the case management team for assistance with care management and care coordination.  ? ?Follow Up Plan: Telephone follow up appointment with care management team member scheduled for: upon care guide rescheduling. ? ?Jacqlyn Larsen RNC, BSN ?RN Case Manager ?[place] ?[phone] ? ?

## 2021-10-26 ENCOUNTER — Ambulatory Visit: Payer: Self-pay | Admitting: Surgery

## 2021-10-26 ENCOUNTER — Ambulatory Visit (INDEPENDENT_AMBULATORY_CARE_PROVIDER_SITE_OTHER): Payer: Medicare Other | Admitting: Surgery

## 2021-10-26 ENCOUNTER — Encounter: Payer: Self-pay | Admitting: Surgery

## 2021-10-26 ENCOUNTER — Other Ambulatory Visit: Payer: Self-pay

## 2021-10-26 ENCOUNTER — Telehealth: Payer: Self-pay | Admitting: Surgery

## 2021-10-26 VITALS — BP 116/74 | HR 84 | Temp 98.7°F | Ht 67.0 in | Wt 221.4 lb

## 2021-10-26 DIAGNOSIS — K8010 Calculus of gallbladder with chronic cholecystitis without obstruction: Secondary | ICD-10-CM

## 2021-10-26 DIAGNOSIS — K8000 Calculus of gallbladder with acute cholecystitis without obstruction: Secondary | ICD-10-CM | POA: Diagnosis not present

## 2021-10-26 NOTE — Progress Notes (Signed)
Patient ID: Virginia Walters, female   DOB: [birth year], 81 y.o.   MRN: [id number] ? ?Chief Complaint: History of acute calculus cholecystitis, cholecystostomy status ? ?History of Present Illness ?Virginia Walters is a 81 y.o. female with a cholecystostomy placed early February for acute calculus cholecystitis.  She has had recent confirmation of patency of her cystic duct, and continues to drain bile at a good volume.  A capping trial was initiated 4 days ago, and due to recurrent pain she could not tolerate it and was returned to passive drainage within a day after.  She denies fevers and chills, she denies nausea and vomiting.  She denies jaundice, acholic stools and hepatitis. ? ?Past Medical History ?Past Medical History:  ?Diagnosis Date  ? CHF (congestive heart failure) ([place])   ? Dyslipidemia   ? Frequent falls   ? GERD (gastroesophageal reflux disease)   ? HTN (hypertension)   ? Memory loss   ? PFO (patent foramen ovale)   ? Retinal artery occlusion   ? Weakness   ?  ? ? ?Past Surgical History:  ?Procedure Laterality Date  ? ANKLE ARTHROSCOPY WITH OPEN REDUCTION INTERNAL FIXATION (ORIF) Right   ? APPENDECTOMY    ? COLONOSCOPY N/A 07/21/2018  ? Procedure: COLONOSCOPY;  Surgeon: Lin Landsman, MD;  Location: [facility] ENDOSCOPY;  Service: Gastroenterology;  Laterality: N/A;  ? COLONOSCOPY WITH PROPOFOL N/A 04/01/2020  ? Procedure: COLONOSCOPY WITH PROPOFOL;  Surgeon: Virgel Manifold, MD;  Location: [facility] ENDOSCOPY;  Service: Endoscopy;  Laterality: N/A;  ? ESOPHAGOGASTRODUODENOSCOPY N/A 07/21/2018  ? Procedure: ESOPHAGOGASTRODUODENOSCOPY (EGD);  Surgeon: Lin Landsman, MD;  Location: [facility] ENDOSCOPY;  Service: Gastroenterology;  Laterality: N/A;  ? ESOPHAGOGASTRODUODENOSCOPY N/A 02/11/2020  ? Procedure: ESOPHAGOGASTRODUODENOSCOPY (EGD);  Surgeon: Virgel Manifold, MD;  Location: [facility] ENDOSCOPY;  Service: Endoscopy;  Laterality: N/A;  ? ESOPHAGOGASTRODUODENOSCOPY (EGD) WITH PROPOFOL N/A 04/01/2020  ?  Procedure: ESOPHAGOGASTRODUODENOSCOPY (EGD) WITH PROPOFOL;  Surgeon: Virgel Manifold, MD;  Location: [facility] ENDOSCOPY;  Service: Endoscopy;  Laterality: N/A;  ? IR CHOLANGIOGRAM EXISTING TUBE  10/22/2021  ? IR PERC CHOLECYSTOSTOMY  09/17/2021  ? TUBAL LIGATION    ? ? ?Allergies  ?Allergen Reactions  ? Oxycodone Itching  ? ? ?Current Outpatient Medications  ?Medication Sig Dispense Refill  ? B Complex-Biotin-FA (B-COMPLEX PO) Take 1 capsule by mouth daily.    ? Budeson-Glycopyrrol-Formoterol (BREZTRI AEROSPHERE) 160-9-4.8 MCG/ACT AERO Inhale 2 puffs into the lungs in the morning and at bedtime. 10.7 g 3  ? Calcium Carbonate-Vitamin D (CALCIUM + D PO) Take 1 capsule by mouth 2 (two) times daily.    ? Cholecalciferol (VITAMIN D) 2000 UNITS CAPS Take 1 capsule by mouth daily.    ? clotrimazole-betamethasone (LOTRISONE) cream Apply 1 application topically 2 (two) times daily. 30 g 0  ? dorzolamide (TRUSOPT) 2 % ophthalmic solution Place 1 drop into the left eye 2 (two) times daily.    ? feeding supplement (ENSURE SURGERY) LIQD Take 237 mLs by mouth 2 (two) times daily between meals.    ? ferrous sulfate 325 (65 FE) MG EC tablet Take 1 tablet (325 mg total) by mouth 2 (two) times daily. 60 tablet 11  ? fish oil-omega-3 fatty acids 1000 MG capsule Take 1,000 mg by mouth daily.    ? latanoprost (XALATAN) 0.005 % ophthalmic solution Place 1 drop into the left eye at bedtime.     ? magnesium 30 MG tablet Take 30 mg by mouth 2 (two) times daily.    ?  melatonin 5 MG TABS Take 5 mg by mouth at bedtime.    ? Multiple Vitamin (MULTIVITAMIN) tablet Take 1 tablet by mouth daily.    ? potassium chloride (KLOR-CON) 10 MEQ tablet TAKE 1 TABLET BY MOUTH  DAILY 90 tablet 3  ? pravastatin (PRAVACHOL) 40 MG tablet TAKE 1 TABLET BY MOUTH  DAILY 90 tablet 3  ? traZODone (DESYREL) 50 MG tablet TAKE 1 TABLET BY MOUTH AT  BEDTIME 90 tablet 3  ? venlafaxine XR (EFFEXOR-XR) 75 MG 24 hr capsule TAKE 1 CAPSULE BY MOUTH  DAILY WITH BREAKFAST 90  capsule 3  ? vitamin E 180 MG (400 UNITS) capsule Take 400 Units by mouth daily.    ? lisinopril (ZESTRIL) 40 MG tablet TAKE 1 TABLET BY MOUTH  DAILY (Patient not taking: Reported on 10/26/2021) 90 tablet 3  ? ?No current facility-administered medications for this visit.  ? ? ?Family History ?Family History  ?Problem Relation Age of Onset  ? Esophageal cancer Father   ? Dementia Mother   ?  ? ? ?Social History ?Social History  ? ?Tobacco Use  ? Smoking status: Former  ?  Packs/day: 1.00  ?  Years: 15.00  ?  Pack years: 15.00  ?  Types: Cigarettes  ?  Quit date: 12/27/1980  ?  Years since quitting: 40.8  ? Smokeless tobacco: Never  ?Vaping Use  ? Vaping Use: Never used  ?Substance Use Topics  ? Alcohol use: Yes  ?  Comment: occassionally  ? Drug use: No  ?  ?  ? ? ?Review of Systems  ?Constitutional: Negative.   ?HENT:  Positive for tinnitus.   ?Eyes:  Positive for blurred vision.  ?Respiratory: Negative.    ?Cardiovascular: Negative.   ?Gastrointestinal: Negative.   ?Genitourinary:  Positive for frequency and urgency.  ?Skin:  Positive for itching.  ?Neurological: Negative.   ?Psychiatric/Behavioral: Negative.    ?  ? ?Physical Exam ?Blood pressure 116/74, pulse 84, temperature 98.7 ?F (37.1 ?C), temperature source Oral, height '5\' 7"'$  (1.702 m), weight 221 lb 6.4 oz (100.4 kg), SpO2 98 %. ?Last Weight  Most recent update: 10/26/2021  9:41 AM  ? ? Weight  ?100.4 kg (221 lb 6.4 oz)  ?      ? ?  ? ? ?CONSTITUTIONAL: Well developed, and nourished, appropriately responsive and aware without distress.   ?EYES: Sclera non-icteric.   ?EARS, NOSE, MOUTH AND THROAT: Mask worn.    Hearing is intact to voice.  ?NECK: Trachea is midline, and there is no jugular venous distension.  ?LYMPH NODES:  Lymph nodes in the neck are not enlarged. ?RESPIRATORY:  Lungs are clear, and breath sounds are equal bilaterally. Normal respiratory effort without pathologic use of accessory muscles. ?CARDIOVASCULAR: Heart is regular in rate and  rhythm. ?GI: The abdomen is soft, nontender, and nondistended.  There is a right upper quadrant drain which appears well secured, the tubing runs down her lateral right pant leg to an undrained passive bile bag that contains perhaps a pint of clean appearing bile.  There were no palpable masses.  ?MUSCULOSKELETAL:  Symmetrical muscle tone appreciated in all four extremities.    ?SKIN: Skin turgor is normal. No pathologic skin lesions appreciated.  ?NEUROLOGIC:  Motor and sensation appear grossly normal.  Cranial nerves are grossly without defect. ?PSYCH:  Alert and oriented to person, place and time. Affect is appropriate for situation. ? ?Data Reviewed ?I have personally reviewed what is currently available of the patient's imaging, recent labs  and medical records.   ?Labs:  ?CBC Latest Ref Rng & Units 10/14/2021 10/09/2021 10/07/2021  ?WBC 3.8 - 10.8 Thousand/uL 7.3 4.0 6.4  ?Hemoglobin 11.7 - 15.5 g/dL 12.0 10.3(L) 11.8(L)  ?Hematocrit 35.0 - 45.0 % 36.5 31.6(L) 36.6  ?Platelets 140 - 400 Thousand/uL 190 162 221  ? ?CMP Latest Ref Rng & Units 10/14/2021 10/09/2021 10/08/2021  ?Glucose 65 - 99 mg/dL 117(H) 84 93  ?BUN 7 - 25 mg/dL 22 42(H) 56(H)  ?Creatinine 0.60 - 0.95 mg/dL 1.37(H) 1.48(H) 1.97(H)  ?Sodium 135 - 146 mmol/L 140 137 138  ?Potassium 3.5 - 5.3 mmol/L 4.4 4.5 4.8  ?Chloride 98 - 110 mmol/L 109 110 109  ?CO2 20 - 32 mmol/L 21 19(L) 23  ?Calcium 8.6 - 10.4 mg/dL 9.7 8.3(L) 8.2(L)  ?Total Protein 6.1 - 8.1 g/dL - - -  ?Total Bilirubin 0.2 - 1.2 mg/dL - - -  ?Alkaline Phos 38 - 126 U/L - - -  ?AST 10 - 35 U/L - - -  ?ALT 6 - 29 U/L - - -  ? ? ? ?Imaging: ?Radiology review:  ? ?Within last 24 hrs: No results found. ? ?Assessment ?   ?Acute calculus cholecystitis status post [facility] cholecystostomy drain. ?Patient Active Problem List  ? Diagnosis Date Noted  ? Pressure injury of skin 10/08/2021  ? AKI (acute kidney injury) ([place]) 10/08/2021  ? Acute renal failure superimposed on stage 3a chronic kidney disease ([place])  10/07/2021  ? COPD (chronic obstructive pulmonary disease) ([place]) 10/07/2021  ? Acute on chronic respiratory failure with hypoxia ([place]) 09/21/2021  ? Insomnia 09/18/2021  ? Acute cholecystitis s/p perc cholecystotom

## 2021-10-26 NOTE — Patient Instructions (Addendum)
Our surgery scheduler Pamala Hurry will call you within 24-48 hours to get you scheduled. If you have not heard from her after 48 hours, please call our office. You will not need to get Covid tested before surgery and have the blue sheet available when she calls to write down important information. ? ? ?If you have any concerns or questions, please feel free to call our office.  ? ? ?Minimally Invasive Cholecystectomy ? ?Minimally invasive cholecystectomy is surgery to remove the gallbladder. The gallbladder is a pear-shaped organ that lies beneath the liver on the right side of the body. The gallbladder stores bile, which is a fluid that helps the body digest fats. Cholecystectomy is often done to treat inflammation (irritation and swelling) of the gallbladder (cholecystitis). This condition is usually caused by a buildup of gallstones (cholelithiasis) in the gallbladder or when the fluid in the gall bladder becomes stagnant because gallstones get stuck in the ducts (tubes) and block the flow of bile. This can result in inflammation and pain. In severe cases, emergency surgery may be required. ?This procedure is done through small incisions in the abdomen, instead of one large incision. It is also called laparoscopic surgery. A thin scope with a camera (laparoscope) is inserted through one incision. Then surgical instruments are inserted through the other incisions. In some cases, a minimally invasive surgery may need to be changed to a surgery that is done through a larger incision. This is called open surgery. ?Tell a health care provider about: ?Any allergies you have. ?All medicines you are taking, including vitamins, herbs, eye drops, creams, and over-the-counter medicines. ?Any problems you or family members have had with anesthetic medicines. ?Any bleeding problems you have. ?Any surgeries you have had. ?Any medical conditions you have. ?Whether you are pregnant or may be pregnant. ?What are the  risks? ?Generally, this is a safe procedure. However, problems may occur, including: ?Infection. ?Bleeding. ?Allergic reactions to medicines. ?Damage to nearby structures or organs. ?A gallstone remaining in the common bile duct. The common bile duct carries bile from the gallbladder to the small intestine. ?A bile leak from the liver or cystic duct after your gallbladder is removed. ?What happens before the procedure? ?When to stop eating and drinking ?Follow instructions from your health care provider about what you may eat and drink before your procedure. These may include: ?8 hours before the procedure ?Stop eating most foods. Do not eat meat, fried foods, or fatty foods. ?Eat only light foods, such as toast or crackers. ?All liquids are okay except energy drinks and alcohol. ?6 hours before the procedure ?Stop eating. ?Drink only clear liquids, such as water, clear fruit juice, black coffee, plain tea, and sports drinks. ?Do not drink energy drinks or alcohol. ?2 hours before the procedure ?Stop drinking all liquids. ?You may be allowed to take medicines with small sips of water. ?If you do not follow your health care provider's instructions, your procedure may be delayed or canceled. ?Medicines ?Ask your health care provider about: ?Changing or stopping your regular medicines. This is especially important if you are taking diabetes medicines or blood thinners. ?Taking medicines such as aspirin and ibuprofen. These medicines can thin your blood. Do not take these medicines unless your health care provider tells you to take them. ?Taking over-the-counter medicines, vitamins, herbs, and supplements. ?General instructions ?If you will be going home right after the procedure, plan to have a responsible adult: ?Take you home from the hospital or clinic. You will not be  allowed to drive. ?Care for you for the time you are told. ?Do not use any products that contain nicotine or tobacco for at least 4 weeks before the  procedure. These products include cigarettes, chewing tobacco, and vaping devices, such as e-cigarettes. If you need help quitting, ask your health care provider. ?Ask your health care provider: ?How your surgery site will be marked. ?What steps will be taken to help prevent infection. These may include: ?Removing hair at the surgery site. ?Washing skin with a germ-killing soap. ?Taking antibiotic medicine. ?What happens during the procedure? ? ?An IV will be inserted into one of your veins. ?You will be given one or both of the following: ?A medicine to help you relax (sedative). ?A medicine to make you fall asleep (general anesthetic). ?Your surgeon will make several small incisions in your abdomen. ?The laparoscope will be inserted through one of the small incisions. The camera on the laparoscope will send images to a monitor in the operating room. This lets your surgeon see inside your abdomen. ?A gas will be pumped into your abdomen. This will expand your abdomen to give the surgeon more room to perform the surgery. ?Other tools that are needed for the procedure will be inserted through the other incisions. The gallbladder will be removed through one of the incisions. ?Your common bile duct may be examined. If stones are found in the common bile duct, they may be removed. ?After your gallbladder has been removed, the incisions will be closed with stitches (sutures), staples, or skin glue. ?Your incisions will be covered with a bandage (dressing). ?The procedure may vary among health care providers and hospitals. ?What happens after the procedure? ?Your blood pressure, heart rate, breathing rate, and blood oxygen level will be monitored until you leave the hospital or clinic. ?You will be given medicines as needed to control your pain. ?You may have a drain placed in the incision. The drain will be removed a day or two after the procedure. ?Summary ?Minimally invasive cholecystectomy, also called laparoscopic  cholecystectomy, is surgery to remove the gallbladder using small incisions. ?Tell your health care provider about all the medical conditions you have and all the medicines you are taking for those conditions. ?Before the procedure, follow instructions about when to stop eating and drinking and changing or stopping medicines. ?Plan to have a responsible adult care for you for the time you are told after you leave the hospital or clinic. ?This information is not intended to replace advice given to you by your health care provider. Make sure you discuss any questions you have with your health care provider. ?Document Revised: 02/02/2021 Document Reviewed: 02/02/2021 ?Elsevier Patient Education ? 2022 [employer]. ? ? ? ? ?

## 2021-10-26 NOTE — H&P (View-Only) (Signed)
Patient ID: Virginia Walters, female   DOB: 1941/02/28, 81 y.o.   MRN: [id number] ? ?Chief Complaint: History of acute calculus cholecystitis, cholecystostomy status ? ?History of Present Illness ?Virginia Walters is a 81 y.o. female with a cholecystostomy placed early February for acute calculus cholecystitis.  She has had recent confirmation of patency of her cystic duct, and continues to drain bile at a good volume.  A capping trial was initiated 4 days ago, and due to recurrent pain she could not tolerate it and was returned to passive drainage within a day after.  She denies fevers and chills, she denies nausea and vomiting.  She denies jaundice, acholic stools and hepatitis. ? ?Past Medical History ?Past Medical History:  ?Diagnosis Date  ? CHF (congestive heart failure) ([place])   ? Dyslipidemia   ? Frequent falls   ? GERD (gastroesophageal reflux disease)   ? HTN (hypertension)   ? Memory loss   ? PFO (patent foramen ovale)   ? Retinal artery occlusion   ? Weakness   ?  ? ? ?Past Surgical History:  ?Procedure Laterality Date  ? ANKLE ARTHROSCOPY WITH OPEN REDUCTION INTERNAL FIXATION (ORIF) Right   ? APPENDECTOMY    ? COLONOSCOPY N/A 07/21/2018  ? Procedure: COLONOSCOPY;  Surgeon: Lin Landsman, MD;  Location: [facility] ENDOSCOPY;  Service: Gastroenterology;  Laterality: N/A;  ? COLONOSCOPY WITH PROPOFOL N/A 04/01/2020  ? Procedure: COLONOSCOPY WITH PROPOFOL;  Surgeon: Virgel Manifold, MD;  Location: [facility] ENDOSCOPY;  Service: Endoscopy;  Laterality: N/A;  ? ESOPHAGOGASTRODUODENOSCOPY N/A 07/21/2018  ? Procedure: ESOPHAGOGASTRODUODENOSCOPY (EGD);  Surgeon: Lin Landsman, MD;  Location: [facility] ENDOSCOPY;  Service: Gastroenterology;  Laterality: N/A;  ? ESOPHAGOGASTRODUODENOSCOPY N/A 02/11/2020  ? Procedure: ESOPHAGOGASTRODUODENOSCOPY (EGD);  Surgeon: Virgel Manifold, MD;  Location: [facility] ENDOSCOPY;  Service: Endoscopy;  Laterality: N/A;  ? ESOPHAGOGASTRODUODENOSCOPY (EGD) WITH PROPOFOL N/A 04/01/2020  ?  Procedure: ESOPHAGOGASTRODUODENOSCOPY (EGD) WITH PROPOFOL;  Surgeon: Virgel Manifold, MD;  Location: [facility] ENDOSCOPY;  Service: Endoscopy;  Laterality: N/A;  ? IR CHOLANGIOGRAM EXISTING TUBE  10/22/2021  ? IR PERC CHOLECYSTOSTOMY  09/17/2021  ? TUBAL LIGATION    ? ? ?Allergies  ?Allergen Reactions  ? Oxycodone Itching  ? ? ?Current Outpatient Medications  ?Medication Sig Dispense Refill  ? B Complex-Biotin-FA (B-COMPLEX PO) Take 1 capsule by mouth daily.    ? Budeson-Glycopyrrol-Formoterol (BREZTRI AEROSPHERE) 160-9-4.8 MCG/ACT AERO Inhale 2 puffs into the lungs in the morning and at bedtime. 10.7 g 3  ? Calcium Carbonate-Vitamin D (CALCIUM + D PO) Take 1 capsule by mouth 2 (two) times daily.    ? Cholecalciferol (VITAMIN D) 2000 UNITS CAPS Take 1 capsule by mouth daily.    ? clotrimazole-betamethasone (LOTRISONE) cream Apply 1 application topically 2 (two) times daily. 30 g 0  ? dorzolamide (TRUSOPT) 2 % ophthalmic solution Place 1 drop into the left eye 2 (two) times daily.    ? feeding supplement (ENSURE SURGERY) LIQD Take 237 mLs by mouth 2 (two) times daily between meals.    ? ferrous sulfate 325 (65 FE) MG EC tablet Take 1 tablet (325 mg total) by mouth 2 (two) times daily. 60 tablet 11  ? fish oil-omega-3 fatty acids 1000 MG capsule Take 1,000 mg by mouth daily.    ? latanoprost (XALATAN) 0.005 % ophthalmic solution Place 1 drop into the left eye at bedtime.     ? magnesium 30 MG tablet Take 30 mg by mouth 2 (two) times daily.    ?  melatonin 5 MG TABS Take 5 mg by mouth at bedtime.    ? Multiple Vitamin (MULTIVITAMIN) tablet Take 1 tablet by mouth daily.    ? potassium chloride (KLOR-CON) 10 MEQ tablet TAKE 1 TABLET BY MOUTH  DAILY 90 tablet 3  ? pravastatin (PRAVACHOL) 40 MG tablet TAKE 1 TABLET BY MOUTH  DAILY 90 tablet 3  ? traZODone (DESYREL) 50 MG tablet TAKE 1 TABLET BY MOUTH AT  BEDTIME 90 tablet 3  ? venlafaxine XR (EFFEXOR-XR) 75 MG 24 hr capsule TAKE 1 CAPSULE BY MOUTH  DAILY WITH BREAKFAST 90  capsule 3  ? vitamin E 180 MG (400 UNITS) capsule Take 400 Units by mouth daily.    ? lisinopril (ZESTRIL) 40 MG tablet TAKE 1 TABLET BY MOUTH  DAILY (Patient not taking: Reported on 10/26/2021) 90 tablet 3  ? ?No current facility-administered medications for this visit.  ? ? ?Family History ?Family History  ?Problem Relation Age of Onset  ? Esophageal cancer Father   ? Dementia Mother   ?  ? ? ?Social History ?Social History  ? ?Tobacco Use  ? Smoking status: Former  ?  Packs/day: 1.00  ?  Years: 15.00  ?  Pack years: 15.00  ?  Types: Cigarettes  ?  Quit date: 12/27/1980  ?  Years since quitting: 40.8  ? Smokeless tobacco: Never  ?Vaping Use  ? Vaping Use: Never used  ?Substance Use Topics  ? Alcohol use: Yes  ?  Comment: occassionally  ? Drug use: No  ?  ?  ? ? ?Review of Systems  ?Constitutional: Negative.   ?HENT:  Positive for tinnitus.   ?Eyes:  Positive for blurred vision.  ?Respiratory: Negative.    ?Cardiovascular: Negative.   ?Gastrointestinal: Negative.   ?Genitourinary:  Positive for frequency and urgency.  ?Skin:  Positive for itching.  ?Neurological: Negative.   ?Psychiatric/Behavioral: Negative.    ?  ? ?Physical Exam ?Blood pressure 116/74, pulse 84, temperature 98.7 ?F (37.1 ?C), temperature source Oral, height '5\' 7"'$  (1.702 m), weight 221 lb 6.4 oz (100.4 kg), SpO2 98 %. ?Last Weight  Most recent update: 10/26/2021  9:41 AM  ? ? Weight  ?100.4 kg (221 lb 6.4 oz)  ?      ? ?  ? ? ?CONSTITUTIONAL: Well developed, and nourished, appropriately responsive and aware without distress.   ?EYES: Sclera non-icteric.   ?EARS, NOSE, MOUTH AND THROAT: Mask worn.    Hearing is intact to voice.  ?NECK: Trachea is midline, and there is no jugular venous distension.  ?LYMPH NODES:  Lymph nodes in the neck are not enlarged. ?RESPIRATORY:  Lungs are clear, and breath sounds are equal bilaterally. Normal respiratory effort without pathologic use of accessory muscles. ?CARDIOVASCULAR: Heart is regular in rate and  rhythm. ?GI: The abdomen is soft, nontender, and nondistended.  There is a right upper quadrant drain which appears well secured, the tubing runs down her lateral right pant leg to an undrained passive bile bag that contains perhaps a pint of clean appearing bile.  There were no palpable masses.  ?MUSCULOSKELETAL:  Symmetrical muscle tone appreciated in all four extremities.    ?SKIN: Skin turgor is normal. No pathologic skin lesions appreciated.  ?NEUROLOGIC:  Motor and sensation appear grossly normal.  Cranial nerves are grossly without defect. ?PSYCH:  Alert and oriented to person, place and time. Affect is appropriate for situation. ? ?Data Reviewed ?I have personally reviewed what is currently available of the patient's imaging, recent labs  and medical records.   ?Labs:  ?CBC Latest Ref Rng & Units 10/14/2021 10/09/2021 10/07/2021  ?WBC 3.8 - 10.8 Thousand/uL 7.3 4.0 6.4  ?Hemoglobin 11.7 - 15.5 g/dL 12.0 10.3(L) 11.8(L)  ?Hematocrit 35.0 - 45.0 % 36.5 31.6(L) 36.6  ?Platelets 140 - 400 Thousand/uL 190 162 221  ? ?CMP Latest Ref Rng & Units 10/14/2021 10/09/2021 10/08/2021  ?Glucose 65 - 99 mg/dL 117(H) 84 93  ?BUN 7 - 25 mg/dL 22 42(H) 56(H)  ?Creatinine 0.60 - 0.95 mg/dL 1.37(H) 1.48(H) 1.97(H)  ?Sodium 135 - 146 mmol/L 140 137 138  ?Potassium 3.5 - 5.3 mmol/L 4.4 4.5 4.8  ?Chloride 98 - 110 mmol/L 109 110 109  ?CO2 20 - 32 mmol/L 21 19(L) 23  ?Calcium 8.6 - 10.4 mg/dL 9.7 8.3(L) 8.2(L)  ?Total Protein 6.1 - 8.1 g/dL - - -  ?Total Bilirubin 0.2 - 1.2 mg/dL - - -  ?Alkaline Phos 38 - 126 U/L - - -  ?AST 10 - 35 U/L - - -  ?ALT 6 - 29 U/L - - -  ? ? ? ?Imaging: ?Radiology review:  ? ?Within last 24 hrs: No results found. ? ?Assessment ?   ?Acute calculus cholecystitis status post [facility] cholecystostomy drain. ?Patient Active Problem List  ? Diagnosis Date Noted  ? Pressure injury of skin 10/08/2021  ? AKI (acute kidney injury) ([place]) 10/08/2021  ? 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 COPD (chronic obstructive pulmonary disease) ([place]) 10/07/2021  ? Acute on chronic respiratory failure with hypoxia ([place]) 09/21/2021  ? Insomnia 09/18/2021  ? Acute cholecystitis s/p perc cholecystotom

## 2021-10-26 NOTE — Telephone Encounter (Signed)
Patient has been advised of Pre-Admission date/time, COVID Testing date and Surgery date. ? ?Surgery Date: 11/01/21 ?Preadmission Testing Date: 10/28/21 (phone 1p-5p) ?Covid Testing Date: Not needed.   ? ?Patient has been made aware to call [phone], between 1-3:00pm the day before surgery, to find out what time to arrive for surgery.   ? ?

## 2021-10-27 ENCOUNTER — Other Ambulatory Visit (HOSPITAL_COMMUNITY): Payer: Self-pay | Admitting: Radiology

## 2021-10-27 ENCOUNTER — Encounter (HOSPITAL_COMMUNITY): Payer: Self-pay

## 2021-10-27 DIAGNOSIS — Z7951 Long term (current) use of inhaled steroids: Secondary | ICD-10-CM | POA: Diagnosis not present

## 2021-10-27 DIAGNOSIS — N179 Acute kidney failure, unspecified: Secondary | ICD-10-CM | POA: Diagnosis not present

## 2021-10-27 DIAGNOSIS — Z9181 History of falling: Secondary | ICD-10-CM | POA: Diagnosis not present

## 2021-10-27 DIAGNOSIS — K819 Cholecystitis, unspecified: Secondary | ICD-10-CM

## 2021-10-27 DIAGNOSIS — R296 Repeated falls: Secondary | ICD-10-CM | POA: Diagnosis not present

## 2021-10-27 DIAGNOSIS — J449 Chronic obstructive pulmonary disease, unspecified: Secondary | ICD-10-CM | POA: Diagnosis not present

## 2021-10-27 DIAGNOSIS — H34239 Retinal artery branch occlusion, unspecified eye: Secondary | ICD-10-CM | POA: Diagnosis not present

## 2021-10-27 DIAGNOSIS — Z934 Other artificial openings of gastrointestinal tract status: Secondary | ICD-10-CM | POA: Diagnosis not present

## 2021-10-27 DIAGNOSIS — I130 Hypertensive heart and chronic kidney disease with heart failure and stage 1 through stage 4 chronic kidney disease, or unspecified chronic kidney disease: Secondary | ICD-10-CM | POA: Diagnosis not present

## 2021-10-27 DIAGNOSIS — Z87891 Personal history of nicotine dependence: Secondary | ICD-10-CM | POA: Diagnosis not present

## 2021-10-27 DIAGNOSIS — K219 Gastro-esophageal reflux disease without esophagitis: Secondary | ICD-10-CM | POA: Diagnosis not present

## 2021-10-27 DIAGNOSIS — Q2112 Patent foramen ovale: Secondary | ICD-10-CM | POA: Diagnosis not present

## 2021-10-27 DIAGNOSIS — E785 Hyperlipidemia, unspecified: Secondary | ICD-10-CM | POA: Diagnosis not present

## 2021-10-27 DIAGNOSIS — D509 Iron deficiency anemia, unspecified: Secondary | ICD-10-CM | POA: Diagnosis not present

## 2021-10-27 DIAGNOSIS — R413 Other amnesia: Secondary | ICD-10-CM | POA: Diagnosis not present

## 2021-10-27 DIAGNOSIS — N1831 Chronic kidney disease, stage 3a: Secondary | ICD-10-CM | POA: Diagnosis not present

## 2021-10-27 DIAGNOSIS — I5022 Chronic systolic (congestive) heart failure: Secondary | ICD-10-CM | POA: Diagnosis not present

## 2021-10-28 ENCOUNTER — Encounter: Payer: Self-pay | Admitting: Urgent Care

## 2021-10-28 ENCOUNTER — Encounter
Admission: RE | Admit: 2021-10-28 | Discharge: 2021-10-28 | Disposition: A | Discharge status: Home / Self Care | Payer: Medicare Other | Source: Ambulatory Visit | Attending: Surgery | Admitting: Surgery

## 2021-10-28 ENCOUNTER — Other Ambulatory Visit: Payer: Self-pay

## 2021-10-28 HISTORY — DX: Pneumonia, unspecified organism: J18.9

## 2021-10-28 HISTORY — DX: Alcohol abuse, uncomplicated: F10.10

## 2021-10-28 HISTORY — DX: Cerebral infarction, unspecified: I63.9

## 2021-10-28 HISTORY — DX: Unspecified dementia, unspecified severity, without behavioral disturbance, psychotic disturbance, mood disturbance, and anxiety: F03.90

## 2021-10-28 HISTORY — DX: Personal history of urinary calculi: Z87.442

## 2021-10-28 HISTORY — DX: Unspecified osteoarthritis, unspecified site: M19.90

## 2021-10-28 HISTORY — DX: Anemia, unspecified: D64.9

## 2021-10-28 HISTORY — DX: Chronic obstructive pulmonary disease, unspecified: J44.9

## 2021-10-28 HISTORY — DX: Calculus of gallbladder with chronic cholecystitis without obstruction: K80.10

## 2021-10-28 NOTE — Patient Instructions (Addendum)
Your procedure is scheduled on:11-01-21 Monday ?Report to the Registration Desk on the 1st floor of the [facility].Then proceed to the 2nd floor Surgery Desk in the [facility] ?To find out your arrival time, please call [phone] between 1PM - 3PM on:10-29-21 Friday ? ?REMEMBER: ?Instructions that are not followed completely may result in serious medical risk, up to and including death; or upon the discretion of your surgeon and anesthesiologist your surgery may need to be rescheduled. ? ?Do not eat food after midnight the night before surgery.  ?No gum chewing, lozengers or hard candies. ? ?You may however, drink CLEAR liquids up to 2 hours before you are scheduled to arrive for your surgery. Do not drink anything within 2 hours of your scheduled arrival time. ? ?Clear liquids include: ?- water  ?- apple juice without pulp ?- gatorade (not RED colors) ?- black coffee or tea (Do NOT add milk or creamers to the coffee or tea) ?Do NOT drink anything that is not on this list. ? ?TAKE THESE MEDICATIONS THE MORNING OF SURGERY WITH A SIP OF WATER: ?-doxazosin (CARDURA)  ?-pravastatin (PRAVACHOL)  ?-venlafaxine XR (EFFEXOR-XR)  ?-verapamil (CALAN-SR) ? ?Use your BREZTRI AEROSPHERE Inhaler the day of surgery ? ?One week prior to surgery: ?Stop Anti-inflammatories (NSAIDS) such as Advil, Aleve, Ibuprofen, Motrin, Naproxen, Naprosyn and Aspirin based products such as Excedrin, [place]ys Powder, BC Powder.You may however, continue to take Tylenol if needed for pain up until the day of surgery. ? ?Stop ANY OVER THE COUNTER supplements/vitamins NOW (10-28-21) until after surgery (VITAMIN C , BIOTIN, CALCIUM + D, VITAMIN D, VITAMIN B-12 , fish oil, magnesium, Multiple Vitamin ,vitamin E ) Continue your potassium chloride (KLOR-CON) and your Melatonin up until the day prior to surgery ? ?No Alcohol for 24 hours before or after surgery. ? ?No Smoking including e-cigarettes for 24 hours prior to surgery.  ?No chewable tobacco  products for at least 6 hours prior to surgery.  ?No nicotine patches on the day of surgery. ? ?Do not use any "recreational" drugs for at least a week prior to your surgery.  ?Please be advised that the combination of cocaine and anesthesia may have negative outcomes, up to and including death. ?If you test positive for cocaine, your surgery will be cancelled. ? ?On the morning of surgery brush your teeth with toothpaste and water, you may rinse your mouth with mouthwash if you wish. ?Do not swallow any toothpaste or mouthwash. ? ?Use CHG wipes as directed on instruction sheet. ? ?Do not wear jewelry, make-up, hairpins, clips or nail polish. ? ?Do not wear lotions, powders, or perfumes.  ? ?Do not shave body from the neck down 48 hours prior to surgery just in case you cut yourself which could leave a site for infection.  ?Also, freshly shaved skin may become irritated if using the CHG soap. ? ?Contact lenses, hearing aids and dentures may not be worn into surgery. ? ?Do not bring valuables to the hospital. [facility] is not responsible for any missing/lost belongings or valuables.  ? ?Notify your doctor if there is any change in your medical condition (cold, fever, infection). ? ?Wear comfortable clothing (specific to your surgery type) to the hospital. ? ?After surgery, you can help prevent lung complications by doing breathing exercises.  ?Take deep breaths and cough every 1-2 hours. Your doctor may order a device called an Incentive Spirometer to help you take deep breaths. ?When coughing or sneezing, hold a pillow firmly against your  incision with both hands. This is called ?splinting.? Doing this helps protect your incision. It also decreases belly discomfort. ? ?If you are being admitted to the hospital overnight, leave your suitcase in the car. ?After surgery it may be brought to your room. ? ?If you are being discharged the day of surgery, you will not be allowed to drive home. ?You will need a  responsible adult (18 years or older) to drive you home and stay with you that night.  ? ?If you are taking public transportation, you will need to have a responsible adult (18 years or older) with you. ?Please confirm with your physician that it is acceptable to use public transportation.  ? ?Please call the [facility] Dept. at [phone] if you have any questions about these instructions. ? ?Surgery Visitation Policy: ? ?Patients undergoing a surgery or procedure may have one family member or support person with them as long as that person is not COVID-19 positive or experiencing its symptoms.  ?That person may remain in the waiting area during the procedure and may rotate out with other people. ? ?

## 2021-10-29 ENCOUNTER — Telehealth: Payer: Self-pay | Admitting: Surgery

## 2021-10-29 ENCOUNTER — Telehealth: Payer: Medicare Other

## 2021-10-29 ENCOUNTER — Telehealth: Payer: Self-pay | Admitting: Urgent Care

## 2021-10-29 ENCOUNTER — Telehealth: Payer: Self-pay

## 2021-10-29 ENCOUNTER — Telehealth: Payer: Self-pay | Admitting: *Deleted

## 2021-10-29 DIAGNOSIS — K219 Gastro-esophageal reflux disease without esophagitis: Secondary | ICD-10-CM | POA: Diagnosis not present

## 2021-10-29 DIAGNOSIS — J449 Chronic obstructive pulmonary disease, unspecified: Secondary | ICD-10-CM | POA: Diagnosis not present

## 2021-10-29 DIAGNOSIS — N179 Acute kidney failure, unspecified: Secondary | ICD-10-CM | POA: Diagnosis not present

## 2021-10-29 DIAGNOSIS — Z9181 History of falling: Secondary | ICD-10-CM | POA: Diagnosis not present

## 2021-10-29 DIAGNOSIS — I5022 Chronic systolic (congestive) heart failure: Secondary | ICD-10-CM | POA: Diagnosis not present

## 2021-10-29 DIAGNOSIS — D509 Iron deficiency anemia, unspecified: Secondary | ICD-10-CM | POA: Diagnosis not present

## 2021-10-29 DIAGNOSIS — I130 Hypertensive heart and chronic kidney disease with heart failure and stage 1 through stage 4 chronic kidney disease, or unspecified chronic kidney disease: Secondary | ICD-10-CM | POA: Diagnosis not present

## 2021-10-29 DIAGNOSIS — Z934 Other artificial openings of gastrointestinal tract status: Secondary | ICD-10-CM | POA: Diagnosis not present

## 2021-10-29 DIAGNOSIS — Z87891 Personal history of nicotine dependence: Secondary | ICD-10-CM | POA: Diagnosis not present

## 2021-10-29 DIAGNOSIS — H34239 Retinal artery branch occlusion, unspecified eye: Secondary | ICD-10-CM | POA: Diagnosis not present

## 2021-10-29 DIAGNOSIS — E785 Hyperlipidemia, unspecified: Secondary | ICD-10-CM | POA: Diagnosis not present

## 2021-10-29 DIAGNOSIS — Z7951 Long term (current) use of inhaled steroids: Secondary | ICD-10-CM | POA: Diagnosis not present

## 2021-10-29 DIAGNOSIS — R296 Repeated falls: Secondary | ICD-10-CM | POA: Diagnosis not present

## 2021-10-29 DIAGNOSIS — R413 Other amnesia: Secondary | ICD-10-CM | POA: Diagnosis not present

## 2021-10-29 DIAGNOSIS — Q2112 Patent foramen ovale: Secondary | ICD-10-CM | POA: Diagnosis not present

## 2021-10-29 DIAGNOSIS — N1831 Chronic kidney disease, stage 3a: Secondary | ICD-10-CM | POA: Diagnosis not present

## 2021-10-29 NOTE — Telephone Encounter (Signed)
?  Care Management  ? ?Follow Up Note ? ? ?10/29/2021 ?Name: Virginia Walters MRN: [id number] DOB: 1941-06-12 ? ? ?Referred by: Susy Frizzle, MD ?Reason for referral : Chronic Care Management (CHF, COPD) ? ? ?A second unsuccessful telephone outreach was attempted today. The patient was referred to the case management team for assistance with care management and care coordination.  ? ?Follow Up Plan: Telephone follow up appointment with care management team member scheduled for: upon care guide rescheduling. ? ?Jacqlyn Larsen RNC, BSN ?RN Case Manager ?[place] ?[phone] ? ? ?

## 2021-10-29 NOTE — Telephone Encounter (Signed)
Surgery with Dr. Christian Mate on 11/01/21 is cancelled due to patient needing medical and cardiac clearance.  Patient calls, she is informed that surgery will need to be postponed for now until she gets medical and cardiac clearance.  Patient states "I do not have any issues with my heart".  Patient is informed that  we can't proceed until clearance has been granted. She is urged to call these providers back and make necessary appointments so that we can proceed for possible rescheduling of her surgery once clearance has been reached.   ?

## 2021-10-29 NOTE — Progress Notes (Signed)
?  Perioperative Services ?Pre-Admission/Anesthesia Testing ? ?  ?Date: 10/29/21 ? ?Name: Virginia Walters ?MRN:   [id number] ? ?Re: Postponing case pending preoperative cardiac clearance ? ?Planned Surgical Procedure(s):  ?ROBOTIC ASSISTED LAPAROSCOPIC CHOLECYSTECTOMY  ? ?Clinical Notes:  ?Patient scheduled for the above procedure on 11/01/2021 with Dr. Ronny Bacon, MD.  In review of her EMR, it is noted that patient was seen earlier this month (10/15/2021 by Dr. Louanna Raw, MD with [facility] in [place]. Given her history of CHF, PFO, and multiple medical cormorbidities, surgeon advised patient that plans were to proceed with surgery following cardiovascular clearance. Patient was then seen in consult by [facility] Christian Mate, MD) for consult regarding the same procedure that she was seen for earlier this month. With that being said, during her PAT interview, patient was adamant that she had never been seen by cardiology.  In review of the available medical records, it appears that the patient has been seen by Rande Lawman Rockey Situ, MD) in the past with her last visit being in 09/2020. ? ?Communication sent to cardiology practice requesting that they contact patient for a visit to reestablish care and for preoperative cardiovascular clearance.  Additionally, sent communication to primary attending surgeons office to make them aware of recommendations for cardiovascular evaluation prior to proceeding with the planned surgical course.  Dr. Christian Mate in agreement.  I have also submitted patient out for medical clearance from her primary care provider Dennard Schaumann, MD) as patient is adamant that he is the only one that manages her medically. ? ?Received a return call from cardiology advising that patient had been contacted, however she stated that "nothing was wrong with her heart" and refused the offered same-day appointment.  Patient was advised to call Dr. Forest Becker  office to further discuss.  Received communication from surgeon's office that patient had MD called and has been advised that her surgery had been canceled pending cardiovascular clearance.  Patient was advised to attend medical/cardiovascular appointments for clearance, as this was the pathway needed in order to proceed with her planned surgical intervention.  Received follow-up communication from cardiology advising that patient had returned call and is now excepting an appointment with their providers for preoperative cardiovascular clearance.  Patient to be seen on 11/05/2021 at 1600 PM.  ? ?Honor Loh, MSN, APRN, FNP-C, CEN ?[place]  ?Peri-operative Services Nurse Practitioner ?Phone: [phone] ?10/29/21 10:16 AM ? ?NOTE: This note has been prepared using [employer]. Despite my best ability to proofread, there is always the potential that unintentional transcriptional errors may still occur from this process. ? ?

## 2021-10-29 NOTE — Telephone Encounter (Signed)
Per pre op patient needs card clearance visit.  Last seen in 2020 and declined further appts .  ? ? ?Attempted to discuss and schedule with patient . She declined same day visit and also declined next available in April as this was not soon enough.   ? ?Patient stated "I do not have a heart problem " ? ?Advised patient to direct questions and concerns to surgeons office.  ?

## 2021-11-01 ENCOUNTER — Encounter: Admission: RE | Payer: Self-pay | Source: Home / Self Care

## 2021-11-01 ENCOUNTER — Ambulatory Visit: Admission: RE | Admit: 2021-11-01 | Payer: Medicare Other | Source: Home / Self Care | Admitting: Surgery

## 2021-11-01 SURGERY — CHOLECYSTECTOMY, ROBOT-ASSISTED, LAPAROSCOPIC
Anesthesia: General

## 2021-11-03 DIAGNOSIS — R413 Other amnesia: Secondary | ICD-10-CM | POA: Diagnosis not present

## 2021-11-03 DIAGNOSIS — E785 Hyperlipidemia, unspecified: Secondary | ICD-10-CM | POA: Diagnosis not present

## 2021-11-03 DIAGNOSIS — Z934 Other artificial openings of gastrointestinal tract status: Secondary | ICD-10-CM | POA: Diagnosis not present

## 2021-11-03 DIAGNOSIS — H34239 Retinal artery branch occlusion, unspecified eye: Secondary | ICD-10-CM | POA: Diagnosis not present

## 2021-11-03 DIAGNOSIS — Z7951 Long term (current) use of inhaled steroids: Secondary | ICD-10-CM | POA: Diagnosis not present

## 2021-11-03 DIAGNOSIS — K219 Gastro-esophageal reflux disease without esophagitis: Secondary | ICD-10-CM | POA: Diagnosis not present

## 2021-11-03 DIAGNOSIS — I5022 Chronic systolic (congestive) heart failure: Secondary | ICD-10-CM | POA: Diagnosis not present

## 2021-11-03 DIAGNOSIS — J449 Chronic obstructive pulmonary disease, unspecified: Secondary | ICD-10-CM | POA: Diagnosis not present

## 2021-11-03 DIAGNOSIS — Q2112 Patent foramen ovale: Secondary | ICD-10-CM | POA: Diagnosis not present

## 2021-11-03 DIAGNOSIS — N179 Acute kidney failure, unspecified: Secondary | ICD-10-CM | POA: Diagnosis not present

## 2021-11-03 DIAGNOSIS — D509 Iron deficiency anemia, unspecified: Secondary | ICD-10-CM | POA: Diagnosis not present

## 2021-11-03 DIAGNOSIS — Z87891 Personal history of nicotine dependence: Secondary | ICD-10-CM | POA: Diagnosis not present

## 2021-11-03 DIAGNOSIS — I130 Hypertensive heart and chronic kidney disease with heart failure and stage 1 through stage 4 chronic kidney disease, or unspecified chronic kidney disease: Secondary | ICD-10-CM | POA: Diagnosis not present

## 2021-11-03 DIAGNOSIS — N1831 Chronic kidney disease, stage 3a: Secondary | ICD-10-CM | POA: Diagnosis not present

## 2021-11-03 DIAGNOSIS — Z9181 History of falling: Secondary | ICD-10-CM | POA: Diagnosis not present

## 2021-11-03 DIAGNOSIS — R296 Repeated falls: Secondary | ICD-10-CM | POA: Diagnosis not present

## 2021-11-05 ENCOUNTER — Other Ambulatory Visit: Payer: Self-pay

## 2021-11-05 ENCOUNTER — Ambulatory Visit: Payer: Medicare Other | Admitting: Cardiovascular Disease

## 2021-11-05 ENCOUNTER — Encounter: Payer: Self-pay | Admitting: Cardiovascular Disease

## 2021-11-05 VITALS — BP 130/70 | HR 88 | Ht 67.0 in | Wt 224.0 lb

## 2021-11-05 DIAGNOSIS — N179 Acute kidney failure, unspecified: Secondary | ICD-10-CM | POA: Diagnosis not present

## 2021-11-05 DIAGNOSIS — K810 Acute cholecystitis: Secondary | ICD-10-CM

## 2021-11-05 DIAGNOSIS — N189 Chronic kidney disease, unspecified: Secondary | ICD-10-CM | POA: Diagnosis not present

## 2021-11-05 DIAGNOSIS — I5022 Chronic systolic (congestive) heart failure: Secondary | ICD-10-CM | POA: Diagnosis not present

## 2021-11-05 DIAGNOSIS — J449 Chronic obstructive pulmonary disease, unspecified: Secondary | ICD-10-CM

## 2021-11-05 DIAGNOSIS — I10 Essential (primary) hypertension: Secondary | ICD-10-CM | POA: Diagnosis not present

## 2021-11-05 NOTE — Progress Notes (Signed)
Cardiology Office Note ? ?Date:  11/05/2021  ? ?Virginia Walters, DOB 02/01/1941, MRN [id number] ? ?PCP:  Susy Frizzle, MD  ? ?Chief Complaint  ?Patient presents with  ? New Patient (Initial Visit)  ?  Cardiac clearance for gallbladder surgery. Medications reviewed by the patient verbally. "Doing well."   ? ? ?HPI:  ?81 y.o. female woman with history of  ?HFpEF (LVEF 45-50% by echo earlier this month),  ?HTN,  ?HLD, ?CVA,  ?Past smoker ?frequent falls with orthostatic hypotension, PFO,  ?anemia with prior upper GI bleed,  ?Who presents for preoperative cardiovascular evaluation ? ?Last seen by myself over 2020 ?hospitalized from 2/2 - 2/7 due to cholecystitis.,  Acute renal failure records reviewed ?Creatinine greater than 3 ?s/p perc cholecystotomy drain 09/17/2021, ? ?Scheduled for cholecystectomy with Dr. Arlee Muslim ? ?She reports no recent cardiac complications ?Denies chest pain or shortness of breath concerning for angina ?No significant lower extremity edema ?Blood pressure stable, no tachypalpitations no near syncope or syncope ?Lisinopril recently held for renal dysfunction.  Blood pressure stable ? ? ?EKG personally reviewed by myself on todays visit ?Normal sinus rhythm rate 88 bpm nonspecific ST abnormality ? ?Other past medical history reviewed ?Myo 2020 ?Normal pharmacologic myocardial perfusion stress test without significant ischemia or scar. ?The left ventricular ejection fraction is normal by visual estimation and Siemens calculation(61%). ?Coronary artery and aortic atherosclerotic calcifications are noted. ?Attenuation correction CT is notable for a hiatal hernia and subcentemeter nodules in the right lung, incomplete characterized. Findings better characterized on dedicated chest CT from 08/14/2018. ?This is a low risk study. ?  ?08/14/2018 CT scan showed Cardiac enlargement, aortic atherosclerosis, and multi vessel ?coronary artery atherosclerotic calcifications.  Aortic ?Atherosclerosis (ICD10-I70.0). ? ?PMH:   has a past medical history of Alcohol abuse, Anemia, Arthritis, CCC (chronic calculous cholecystitis), CHF (congestive heart failure) (HCC), Chronic kidney disease, COPD (chronic obstructive pulmonary disease) ([place]), Dementia ([place]), Dyslipidemia, Dyspnea, Frequent falls, GERD (gastroesophageal reflux disease), History of kidney stones, HTN (hypertension), Memory loss, PFO (patent foramen ovale), Pneumonia, Retinal artery occlusion, Stroke ([place]), and Weakness. ? ?PSH:    ?Past Surgical History:  ?Procedure Laterality Date  ? ANKLE ARTHROSCOPY WITH OPEN REDUCTION INTERNAL FIXATION (ORIF) Right   ? APPENDECTOMY    ? COLONOSCOPY N/A 07/21/2018  ? Procedure: COLONOSCOPY;  Surgeon: Lin Landsman, MD;  Location[facility] END[facility]COPY;  Service: Gastroenterology;  Laterality: N/A;  ? COLONOSCOPY WITH PROPOFOL N/A 04/01/2020  ? Procedure: COLONOSCOPY WITH PROPOFOL;  Surgeon: Virgel Manifold, MD;  Location: [facility] ENDOSCOPY;  Service: Endoscopy;  Laterality: N/A;  ? ESOPHAGOGASTRODUODENOSCOPY N/A 07/21/2018  ? Procedure: ESOPHAGOGASTRODUODENOSCOPY (EGD);  Surgeon: Lin Landsman, MD;  Location: [facility] ENDOSCOPY;  Service: Gastroenterology;  Laterality: N/A;  ? ESOPHAGOGASTRODUODENOSCOPY N/A 02/11/2020  ? Procedure: ESOPHAGOGASTRODUODENOSCOPY (EGD);  Surgeon: Virgel Manifold, MD;  Location: [facility] ENDOSCOPY;  Service: Endoscopy;  Laterality: N/A;  ? ESOPHAGOGASTRODUODENOSCOPY (EGD) WITH PROPOFOL N/A 04/01/2020  ? Procedure: ESOPHAGOGASTRODUODENOSCOPY (EGD) WITH PROPOFOL;  Surgeon: Virgel Manifold, MD;  Location: [facility] ENDOSCOPY;  Service: Endoscopy;  Laterality: N/A;  ? FRACTURE SURGERY Left   ? leg fx  ? IR EXCHANGE BILIARY DRAIN  10/22/2021  ? IR PERC CHOLECYSTOSTOMY  09/17/2021  ? TUBAL LIGATION    ? ? ?Current Outpatient Medications  ?Medication Sig Dispense Refill  ? Ascorbic Acid (VITAMIN C PO) Take by mouth.    ? BIOTIN PO Take 1 tablet by mouth daily.    ?  Budeson-Glycopyrrol-Formoterol (BREZTRI AEROSPHERE) 160-9-4.8 MCG/ACT  AERO Inhale 2 puffs into the lungs in the morning and at bedtime. 10.7 g 3  ? Calcium Carbonate-Vitamin D (CALCIUM + D PO) Take 1 capsule by mouth 2 (two) times daily.    ? Cholecalciferol (VITAMIN D) 2000 UNITS CAPS Take 2,000 Units by mouth daily.    ? clotrimazole-betamethasone (LOTRISONE) cream Apply 1 application topically 2 (two) times daily. 30 g 0  ? Cyanocobalamin (VITAMIN B-12 PO) Take by mouth.    ? dorzolamide (TRUSOPT) 2 % ophthalmic solution Place 1 drop into the left eye 2 (two) times daily.    ? doxazosin (CARDURA) 4 MG tablet Take 2 mg by mouth every morning.    ? latanoprost (XALATAN) 0.005 % ophthalmic solution Place 1 drop into the left eye at bedtime.     ? magnesium 30 MG tablet Take 30 mg by mouth daily.    ? melatonin 5 MG TABS Take 5 mg by mouth at bedtime.    ? Multiple Vitamin (MULTIVITAMIN) tablet Take 1 tablet by mouth daily.    ? potassium chloride (KLOR-CON) 10 MEQ tablet TAKE 1 TABLET BY MOUTH  DAILY 90 tablet 3  ? pravastatin (PRAVACHOL) 40 MG tablet TAKE 1 TABLET BY MOUTH  DAILY (Patient taking differently: Take 40 mg by mouth every morning.) 90 tablet 3  ? traZODone (DESYREL) 50 MG tablet TAKE 1 TABLET BY MOUTH AT  BEDTIME 90 tablet 3  ? venlafaxine XR (EFFEXOR-XR) 75 MG 24 hr capsule TAKE 1 CAPSULE BY MOUTH  DAILY WITH BREAKFAST 90 capsule 3  ? verapamil (CALAN-SR) 240 MG CR tablet Take 240 mg by mouth every morning.    ? vitamin E 180 MG (400 UNITS) capsule Take 400 Units by mouth daily.    ? feeding supplement (ENSURE SURGERY) LIQD Take 237 mLs by mouth 2 (two) times daily between meals. (Patient not taking: Reported on 10/27/2021)    ? ferrous sulfate 325 (65 FE) MG EC tablet Take 1 tablet (325 mg total) by mouth 2 (two) times daily. (Patient not taking: Reported on 11/05/2021) 60 tablet 11  ? fish oil-omega-3 fatty acids 1000 MG capsule Take 1,000 mg by mouth daily. (Patient not taking: Reported on 11/05/2021)     ? lisinopril (ZESTRIL) 40 MG tablet TAKE 1 TABLET BY MOUTH  DAILY (Patient not taking: Reported on 10/26/2021) 90 tablet 3  ? ?No current facility-administered medications for this visit.  ? ? ?Allergies:   Oxycodone  ? ?Social History:  The patient  reports that she quit smoking about 40 years ago. Her smoking use included cigarettes. She has a 15.00 pack-year smoking history. She has never used smokeless tobacco. She reports that she does not currently use alcohol. She reports that she does not use drugs.  ? ?Family History:   family history includes Dementia in her mother; Esophageal cancer in her father.  ? ? ?Review of Systems: ?Review of Systems  ?Constitutional: Negative.   ?Eyes: Negative.   ?Respiratory:  Positive for shortness of breath.   ?Cardiovascular: Negative.   ?Gastrointestinal: Negative.   ?Genitourinary: Negative.   ?Musculoskeletal: Negative.   ?Neurological: Negative.   ?Psychiatric/Behavioral: Negative.    ?All other systems reviewed and are negative. ? ? ?PHYSICAL EXAM: ?VS:  BP 130/70 (BP Location: Left Arm, Patient Position: Sitting, Cuff Size: Normal)   Pulse 88   Ht '5\' 7"'$  (1.702 m)   Wt 224 lb (101.6 kg)   SpO2 96%   BMI 35.08 kg/m?  , BMI Body mass index is 35.08  kg/m?. ? ?Constitutional:  oriented to person, place, and time. No distress.  Obese, presents in a wheelchair ?HENT:  ?Head: Grossly normal ?Eyes:  no discharge. No scleral icterus.  ?Neck: No JVD, no carotid bruits  ?Cardiovascular: Regular rate and rhythm, no murmurs appreciated ?Pulmonary/Chest: Clear to auscultation bilaterally, no wheezes or rails ?Abdominal: Soft.  no distension.  no tenderness.  ?Musculoskeletal: Normal range of motion ?Neurological:  normal muscle tone. Coordination normal. No atrophy ?Skin: Skin warm and dry ?Psychiatric: normal affect, pleasant ? ? ?Recent Labs: ?10/05/2021: ALT 25 ?10/07/2021: B Natriuretic Peptide 8.5; Magnesium 2.5 ?10/14/2021: BUN 22; Creat 1.37; Hemoglobin 12.0; Platelets 190;  Potassium 4.4; Sodium 140  ? ? ?Lipid Panel ?Lab Results  ?Component Value Date  ? CHOL 158 03/06/2017  ? HDL 72 03/06/2017  ? [place] 64 03/06/2017  ? TRIG 111 03/06/2017  ? ?  ? ?Wt Readings from Last 3 Encounters:  ?03/24/2

## 2021-11-05 NOTE — Patient Instructions (Signed)
Medication Instructions:  No changes  If you need a refill on your cardiac medications before your next appointment, please call your pharmacy.   Lab work: No new labs needed  Testing/Procedures: No new testing needed  Follow-Up: At [facility], you and your health needs are our priority.  As part of our continuing mission to provide you with exceptional heart care, we have created designated Provider Care Teams.  These Care Teams include your primary Cardiologist (physician) and Advanced Practice Providers (APPs -  Physician Assistants and Nurse Practitioners) who all work together to provide you with the care you need, when you need it.  You will need a follow up appointment in 12 months  Providers on your designated Care Team:   Christopher Berge, NP Ryan Dunn, PA-C Cadence Furth, PA-C  COVID-19 Vaccine Information can be found at: [email or web address] For questions related to vaccine distribution or appointments, please email [email or web address] or call [phone].   

## 2021-11-09 DIAGNOSIS — K219 Gastro-esophageal reflux disease without esophagitis: Secondary | ICD-10-CM | POA: Diagnosis not present

## 2021-11-09 DIAGNOSIS — D509 Iron deficiency anemia, unspecified: Secondary | ICD-10-CM | POA: Diagnosis not present

## 2021-11-09 DIAGNOSIS — I5022 Chronic systolic (congestive) heart failure: Secondary | ICD-10-CM | POA: Diagnosis not present

## 2021-11-09 DIAGNOSIS — Z934 Other artificial openings of gastrointestinal tract status: Secondary | ICD-10-CM | POA: Diagnosis not present

## 2021-11-09 DIAGNOSIS — E785 Hyperlipidemia, unspecified: Secondary | ICD-10-CM | POA: Diagnosis not present

## 2021-11-09 DIAGNOSIS — N1831 Chronic kidney disease, stage 3a: Secondary | ICD-10-CM | POA: Diagnosis not present

## 2021-11-09 DIAGNOSIS — Z9181 History of falling: Secondary | ICD-10-CM | POA: Diagnosis not present

## 2021-11-09 DIAGNOSIS — Q2112 Patent foramen ovale: Secondary | ICD-10-CM | POA: Diagnosis not present

## 2021-11-09 DIAGNOSIS — J449 Chronic obstructive pulmonary disease, unspecified: Secondary | ICD-10-CM | POA: Diagnosis not present

## 2021-11-09 DIAGNOSIS — R296 Repeated falls: Secondary | ICD-10-CM | POA: Diagnosis not present

## 2021-11-09 DIAGNOSIS — Z7951 Long term (current) use of inhaled steroids: Secondary | ICD-10-CM | POA: Diagnosis not present

## 2021-11-09 DIAGNOSIS — N179 Acute kidney failure, unspecified: Secondary | ICD-10-CM | POA: Diagnosis not present

## 2021-11-09 DIAGNOSIS — Z87891 Personal history of nicotine dependence: Secondary | ICD-10-CM | POA: Diagnosis not present

## 2021-11-09 DIAGNOSIS — H34239 Retinal artery branch occlusion, unspecified eye: Secondary | ICD-10-CM | POA: Diagnosis not present

## 2021-11-09 DIAGNOSIS — R413 Other amnesia: Secondary | ICD-10-CM | POA: Diagnosis not present

## 2021-11-09 DIAGNOSIS — I130 Hypertensive heart and chronic kidney disease with heart failure and stage 1 through stage 4 chronic kidney disease, or unspecified chronic kidney disease: Secondary | ICD-10-CM | POA: Diagnosis not present

## 2021-11-11 ENCOUNTER — Telehealth: Payer: Self-pay | Admitting: Surgery

## 2021-11-11 NOTE — Telephone Encounter (Signed)
Outgoing call is made, left message for patient to call.  Please inform patient of the following regarding scheduled surgery:  ? ?Pre-Admission date/time, COVID Testing date and Surgery date. ? ?Surgery Date: 11/17/21 ?Preadmission Testing Date: 11/15/21 (phone 8a-1p) ?Covid Testing Date: Not needed.    ? ?Also patient will need to call at [phone], between 1-3:00pm the day before surgery, to find out what time to arrive for surgery.   ? ?

## 2021-11-11 NOTE — Telephone Encounter (Signed)
Patient calls back, she is now informed of all dates regarding her surgery and verbalized understanding.   

## 2021-11-12 DIAGNOSIS — J449 Chronic obstructive pulmonary disease, unspecified: Secondary | ICD-10-CM

## 2021-11-12 DIAGNOSIS — I110 Hypertensive heart disease with heart failure: Secondary | ICD-10-CM | POA: Diagnosis not present

## 2021-11-15 ENCOUNTER — Inpatient Hospital Stay: Admission: RE | Admit: 2021-11-15 | Payer: Medicare Other | Source: Ambulatory Visit

## 2021-11-15 ENCOUNTER — Ambulatory Visit: Payer: Self-pay | Admitting: Surgery

## 2021-11-15 ENCOUNTER — Encounter: Payer: Self-pay | Admitting: Surgery

## 2021-11-15 DIAGNOSIS — K8010 Calculus of gallbladder with chronic cholecystitis without obstruction: Secondary | ICD-10-CM

## 2021-11-15 NOTE — Progress Notes (Signed)
?Perioperative Services ? ?Pre-Admission/Anesthesia Testing Clinical Review ? ?Date: 11/15/21 ? ?Patient Demographics:  ?Name: Virginia Walters ?DOB:   [birth year] ?MRN:   [id number] ? ?Planned Surgical Procedure(s):  ? ? Case: [id number] Date/Time: 11/17/21 1045  ? Procedures:  ?    XI ROBOTIC ASSISTED LAPAROSCOPIC CHOLECYSTECTOMY ?    INDOCYANINE GREEN FLUORESCENCE IMAGING (ICG)  ? Anesthesia type: General  ? Pre-op diagnosis: chronic calculous cholecystitis  ? Location: [facility] OR ROOM 06 / [facility] ORS FOR ANESTHESIA GROUP  ? Surgeons: Ronny Bacon, MD  ? ?NOTE: Available PAT nursing documentation and vital signs have been reviewed. Clinical nursing staff has updated patient's PMH/PSHx, current medication list, and drug allergies/intolerances to ensure comprehensive history available to assist in medical decision making as it pertains to the aforementioned surgical procedure and anticipated anesthetic course. Extensive review of available clinical information performed. [place] PMH and PSHx updated with any diagnoses/procedures that  may have been inadvertently omitted during her intake with the pre-admission testing department's nursing staff. ? ?Clinical Discussion:  ?Virginia Walters is a 81 y.o. female who is submitted for pre-surgical anesthesia review and clearance prior to her undergoing the above procedure. Patient is a Former Smoker (15 pack years; quit 12/1980). Pertinent PMH includes: HFpEF, PFO, aortic atherosclerosis, CVA, HTN, HLD, CKD-III, GERD (no daily Tx), hiatal hernia, COPD, DOE, anemia, chronic calculus cholecystitis, lumbar spondylosis, OA, alcohol abuse/dependence with mood disorder, brief psychotic disorder, major depression, dementia, sleeping difficulties, frequent falls. ? ?Patient is followed by cardiology Rockey Situ, MD). She was last seen in the cardiology clinic on 11/05/2021; notes reviewed.  At the time of her clinic visit, patient doing well overall from a cardiovascular perspective.   She denied any chest pain, shortness breath, PND, orthopnea, palpitations, significant peripheral edema, vertiginous symptoms, or presyncope/syncope.  Patient with a past medical history significant for cardiovascular diagnoses. ? ?TTE performed on 07/22/2018 revealed a mildly reduced left ventricular systolic function with mild LVH; LVEF 45-50%.  There was diffuse left ventricular hypokinesis. Diastolic Doppler parameters consistent with abnormal relaxation (G1DD).  Left atrium mildly dilated.  Mitral valve annulus mildly calcified with thickened leaflets and mild regurgitation.  There was no evidence of other valvular stenosis or a significant transvalvular gradient to suggest stenosis.  PASP mildly increased at 30-35 mmHg. ? ?Myocardial perfusion imaging study performed on 10/19/2018 demonstrated a normal left ventricular systolic function with normal EF.  There was no evidence of stress-induced myocardial ischemia or arrhythmia.  Study determined to be normal and low risk. ? ?Blood pressure reasonably controlled at 130/70 on currently prescribed alpha-blocker monotherapy.  Of note, given recent renal concerns, ACEi (lisinopril) therapy was discontinued. Patient is on a statin for her HLD.  She is not diabetic. Functional capacity, as defined by DASI, is documented as being >/= 4 METS.  No changes were made to her medication regimen.  Patient to follow-up with outpatient cardiology at defined intervals for ongoing management and evaluation of her cardiovascular diagnoses. ? ?Enjoli I Ehler is scheduled for an ROBOTIC ASSISTED LAPAROSCOPIC CHOLECYSTECTOMY on 11/17/2021 with Dr. Ronny Bacon, MD. Given patient's past medical history significant for cardiovascular diagnoses, presurgical cardiac clearance was sought by the PAT team. Additionally, I have requested medical clearance from her PCP. Specialty clearances were obtained as follows: ? ?Per internal/family medicine Dennard Schaumann, MD), "patient has COPD. She  is felt to be optimized at this point and may proceed at an overall MODERATE risk of developing significant perioperative complications".  ? ?Per cardiology, "patient has been seen  and evaluated by cardiology service.  She is felt to be optimized and cleared to proceed with planned surgical intervention and an overall ACCEPTABLE risk of significant perioperative cardiovascular complications.  Patient will require no further cardiovascular testing prior to her procedure".  In review of patient's medication reconciliation, it is noted that she is not currently taking any type of anticoagulation or antiplatelet therapies that would need to be held during her perioperative course. ? ?Patient denies previous perioperative complications with anesthesia in the past. In review of the available records, it is noted that patient underwent a general anesthetic course here (ASA III) in 03/2020 without documented complications.  ? ? ?  11/05/2021  ?  4:08 PM 11/05/2021  ?  4:05 PM 10/26/2021  ?  9:40 AM  ?Vitals with BMI  ?Height '5\' 7"'$  '5\' 7"'$  '5\' 7"'$   ?Weight 224 lbs 224 lbs 221 lbs 6 oz  ?BMI 35.08 35.08 34.67  ?Systolic 400 867 619  ?Diastolic 70 70 74  ?Pulse 88 88 84  ? ? ?Providers/Specialists:  ? ?NOTE: Primary physician provider listed below. Patient may have been seen by APP or partner within same practice.  ? ?PROVIDER ROLE / SPECIALTY LAST OV  ?Ronny Bacon, MD General Surgery (Surgeon) 10/26/2021  ?Susy Frizzle, MD Primary Care Provider 10/14/2021  ?Ida Rogue, MD Cardiology 11/05/2021  ? ?Allergies:  ?Oxycodone ? ?Current Home Medications:  ? ?No current facility-administered medications for this encounter.  ? ? Ascorbic Acid (VITAMIN C PO)  ? BIOTIN PO  ? Budeson-Glycopyrrol-Formoterol (BREZTRI AEROSPHERE) 160-9-4.8 MCG/ACT AERO  ? Calcium Carbonate-Vitamin D (CALCIUM + D PO)  ? Cholecalciferol (VITAMIN D) 2000 UNITS CAPS  ? clotrimazole-betamethasone (LOTRISONE) cream  ? Cyanocobalamin (VITAMIN B-12 PO)  ?  dorzolamide (TRUSOPT) 2 % ophthalmic solution  ? doxazosin (CARDURA) 4 MG tablet  ? feeding supplement (ENSURE SURGERY) LIQD  ? ferrous sulfate 325 (65 FE) MG EC tablet  ? fish oil-omega-3 fatty acids 1000 MG capsule  ? latanoprost (XALATAN) 0.005 % ophthalmic solution  ? lisinopril (ZESTRIL) 40 MG tablet  ? magnesium 30 MG tablet  ? melatonin 5 MG TABS  ? Multiple Vitamin (MULTIVITAMIN) tablet  ? potassium chloride (KLOR-CON) 10 MEQ tablet  ? pravastatin (PRAVACHOL) 40 MG tablet  ? traZODone (DESYREL) 50 MG tablet  ? venlafaxine XR (EFFEXOR-XR) 75 MG 24 hr capsule  ? verapamil (CALAN-SR) 240 MG CR tablet  ? vitamin E 180 MG (400 UNITS) capsule  ? ?History:  ? ?Past Medical History:  ?Diagnosis Date  ? (HFpEF) heart failure with preserved ejection fraction ([place])   ? a.) TTE 07/22/2018: EF 45-50%; mild LVH; mild diffuse HK; mild LA dilation; PASP 30-35 mmHg; G1DD.  ? Alcohol abuse   ? Alcohol dependence with alcohol-induced mood disorder ([place])   ? Anemia   ? Aortic atherosclerosis ([place])   ? Arthritis   ? 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 a.) auditory hallucinations (people arguing in home who were not there); visual hallucinations (seeing dead husband); patient was involuntarily committed/admitted to [facility].  ? CCC (chronic calculous cholecystitis)   ? CKD (chronic kidney disease), stage III ([place])   ? COPD (chronic obstructive pulmonary disease) ([place])   ? Dementia ([place])   ? Diverticulosis   ? Dyslipidemia   ? Dyspnea on exertion   ? Frequent falls   ? GERD (gastroesophageal reflux disease)   ? Hepatic steatosis   ? Hiatal hernia   ? History of kidney stones   ? HTN (hypertension)   ?  Lumbar spondylosis   ? MDD (major depressive disorder)   ? PFO (patent foramen ovale)   ? Pneumonia   ? Retinal artery occlusion   ? Sleep difficulties   ? a.) takes melatonin + trazodone  ? Stroke [facility])   ? 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 Weakness   ? ?Past Surgical History:  ?Procedure Laterality Date  ? ANKLE ARTHROSCOPY  WITH OPEN REDUCTION INTERNAL FIXATION (ORIF) Right   ? APPENDECTOMY    ? COLONOSCOPY N/A 07/21/2018  ? Procedure: COLONOSCOPY;  Surgeon: Lin Landsman, MD;  Location: [facility] ENDOSCOPY;  Service: Rozanna Boer

## 2021-11-15 NOTE — Pre-Procedure Instructions (Signed)
Called patient this am regarding surgery rescheduled from 11/01/21 to 11/17/21. Patient has instructions from previous  PAT phone interview and states she does not have any questions at this time. She was encouraged to review them today and call back if needed.

## 2021-11-16 DIAGNOSIS — H34239 Retinal artery branch occlusion, unspecified eye: Secondary | ICD-10-CM | POA: Diagnosis not present

## 2021-11-16 DIAGNOSIS — E785 Hyperlipidemia, unspecified: Secondary | ICD-10-CM | POA: Diagnosis not present

## 2021-11-16 DIAGNOSIS — D509 Iron deficiency anemia, unspecified: Secondary | ICD-10-CM | POA: Diagnosis not present

## 2021-11-16 DIAGNOSIS — I5022 Chronic systolic (congestive) heart failure: Secondary | ICD-10-CM | POA: Diagnosis not present

## 2021-11-16 DIAGNOSIS — Z934 Other artificial openings of gastrointestinal tract status: Secondary | ICD-10-CM | POA: Diagnosis not present

## 2021-11-16 DIAGNOSIS — N1831 Chronic kidney disease, stage 3a: Secondary | ICD-10-CM | POA: Diagnosis not present

## 2021-11-16 DIAGNOSIS — I130 Hypertensive heart and chronic kidney disease with heart failure and stage 1 through stage 4 chronic kidney disease, or unspecified chronic kidney disease: Secondary | ICD-10-CM | POA: Diagnosis not present

## 2021-11-16 DIAGNOSIS — Z9181 History of falling: Secondary | ICD-10-CM | POA: Diagnosis not present

## 2021-11-16 DIAGNOSIS — K219 Gastro-esophageal reflux disease without esophagitis: Secondary | ICD-10-CM | POA: Diagnosis not present

## 2021-11-16 DIAGNOSIS — J449 Chronic obstructive pulmonary disease, unspecified: Secondary | ICD-10-CM | POA: Diagnosis not present

## 2021-11-16 DIAGNOSIS — Q2112 Patent foramen ovale: Secondary | ICD-10-CM | POA: Diagnosis not present

## 2021-11-16 DIAGNOSIS — R296 Repeated falls: Secondary | ICD-10-CM | POA: Diagnosis not present

## 2021-11-16 DIAGNOSIS — Z87891 Personal history of nicotine dependence: Secondary | ICD-10-CM | POA: Diagnosis not present

## 2021-11-16 DIAGNOSIS — R413 Other amnesia: Secondary | ICD-10-CM | POA: Diagnosis not present

## 2021-11-16 DIAGNOSIS — N179 Acute kidney failure, unspecified: Secondary | ICD-10-CM | POA: Diagnosis not present

## 2021-11-16 DIAGNOSIS — Z7951 Long term (current) use of inhaled steroids: Secondary | ICD-10-CM | POA: Diagnosis not present

## 2021-11-17 ENCOUNTER — Ambulatory Visit: Payer: Medicare Other | Admitting: Urgent Care

## 2021-11-17 ENCOUNTER — Other Ambulatory Visit: Payer: Self-pay

## 2021-11-17 ENCOUNTER — Ambulatory Visit
Admission: RE | Admit: 2021-11-17 | Discharge: 2021-11-17 | Disposition: A | Discharge status: Home / Self Care | Payer: Medicare Other | Attending: Surgery | Admitting: Surgery

## 2021-11-17 ENCOUNTER — Encounter: Payer: Self-pay | Admitting: Surgery

## 2021-11-17 ENCOUNTER — Encounter
Admission: RE | Disposition: A | Discharge status: Home / Self Care | Payer: Self-pay | Source: Home / Self Care | Attending: Surgery

## 2021-11-17 DIAGNOSIS — I5032 Chronic diastolic (congestive) heart failure: Secondary | ICD-10-CM | POA: Diagnosis not present

## 2021-11-17 DIAGNOSIS — I69398 Other sequelae of cerebral infarction: Secondary | ICD-10-CM | POA: Insufficient documentation

## 2021-11-17 DIAGNOSIS — Z87891 Personal history of nicotine dependence: Secondary | ICD-10-CM | POA: Insufficient documentation

## 2021-11-17 DIAGNOSIS — N183 Chronic kidney disease, stage 3 unspecified: Secondary | ICD-10-CM | POA: Diagnosis not present

## 2021-11-17 DIAGNOSIS — I700 Atherosclerosis of aorta: Secondary | ICD-10-CM | POA: Insufficient documentation

## 2021-11-17 DIAGNOSIS — E785 Hyperlipidemia, unspecified: Secondary | ICD-10-CM | POA: Insufficient documentation

## 2021-11-17 DIAGNOSIS — D631 Anemia in chronic kidney disease: Secondary | ICD-10-CM | POA: Diagnosis not present

## 2021-11-17 DIAGNOSIS — H5461 Unqualified visual loss, right eye, normal vision left eye: Secondary | ICD-10-CM | POA: Diagnosis not present

## 2021-11-17 DIAGNOSIS — K8012 Calculus of gallbladder with acute and chronic cholecystitis without obstruction: Secondary | ICD-10-CM | POA: Diagnosis not present

## 2021-11-17 DIAGNOSIS — J441 Chronic obstructive pulmonary disease with (acute) exacerbation: Secondary | ICD-10-CM | POA: Diagnosis not present

## 2021-11-17 DIAGNOSIS — K8010 Calculus of gallbladder with chronic cholecystitis without obstruction: Secondary | ICD-10-CM

## 2021-11-17 DIAGNOSIS — I130 Hypertensive heart and chronic kidney disease with heart failure and stage 1 through stage 4 chronic kidney disease, or unspecified chronic kidney disease: Secondary | ICD-10-CM | POA: Diagnosis not present

## 2021-11-17 DIAGNOSIS — I2510 Atherosclerotic heart disease of native coronary artery without angina pectoris: Secondary | ICD-10-CM | POA: Diagnosis not present

## 2021-11-17 DIAGNOSIS — F329 Major depressive disorder, single episode, unspecified: Secondary | ICD-10-CM | POA: Insufficient documentation

## 2021-11-17 DIAGNOSIS — K8000 Calculus of gallbladder with acute cholecystitis without obstruction: Secondary | ICD-10-CM | POA: Diagnosis not present

## 2021-11-17 DIAGNOSIS — K658 Other peritonitis: Secondary | ICD-10-CM | POA: Diagnosis not present

## 2021-11-17 DIAGNOSIS — Z9181 History of falling: Secondary | ICD-10-CM | POA: Insufficient documentation

## 2021-11-17 DIAGNOSIS — K219 Gastro-esophageal reflux disease without esophagitis: Secondary | ICD-10-CM | POA: Diagnosis not present

## 2021-11-17 DIAGNOSIS — K811 Chronic cholecystitis: Secondary | ICD-10-CM | POA: Diagnosis not present

## 2021-11-17 HISTORY — DX: Sleep disorder, unspecified: G47.9

## 2021-11-17 HISTORY — DX: Spondylosis without myelopathy or radiculopathy, lumbar region: M47.816

## 2021-11-17 HISTORY — DX: Fatty (change of) liver, not elsewhere classified: K76.0

## 2021-11-17 HISTORY — DX: Major depressive disorder, single episode, unspecified: F32.9

## 2021-11-17 HISTORY — DX: Atherosclerosis of aorta: I70.0

## 2021-11-17 HISTORY — DX: Unspecified diastolic (congestive) heart failure: I50.30

## 2021-11-17 HISTORY — DX: Alcohol dependence with alcohol-induced mood disorder: F10.24

## 2021-11-17 HISTORY — DX: Diaphragmatic hernia without obstruction or gangrene: K44.9

## 2021-11-17 HISTORY — DX: Diverticulosis of intestine, part unspecified, without perforation or abscess without bleeding: K57.90

## 2021-11-17 HISTORY — DX: Other forms of dyspnea: R06.09

## 2021-11-17 HISTORY — DX: Chronic kidney disease, stage 3 unspecified: N18.30

## 2021-11-17 LAB — COMPREHENSIVE METABOLIC PANEL
ALT: 27 U/L (ref 0–44)
AST: 19 U/L (ref 15–41)
Albumin: 3.6 g/dL (ref 3.5–5.0)
Alkaline Phosphatase: 78 U/L (ref 38–126)
Anion gap: 11 (ref 5–15)
BUN: 14 mg/dL (ref 8–23)
CO2: 21 mmol/L — ABNORMAL LOW (ref 22–32)
Calcium: 8.7 mg/dL — ABNORMAL LOW (ref 8.9–10.3)
Chloride: 105 mmol/L (ref 98–111)
Creatinine, Ser: 1.13 mg/dL — ABNORMAL HIGH (ref 0.44–1.00)
GFR, Estimated: 49 mL/min — ABNORMAL LOW (ref >60)
Glucose, Bld: 111 mg/dL — ABNORMAL HIGH (ref 70–99)
Potassium: 3.8 mmol/L (ref 3.5–5.1)
Sodium: 137 mmol/L (ref 135–145)
Total Bilirubin: 0.5 mg/dL (ref 0.3–1.2)
Total Protein: 7.2 g/dL (ref 6.5–8.1)

## 2021-11-17 LAB — CBC WITH DIFFERENTIAL/PLATELET
Abs Immature Granulocytes: 0.03 10*3/uL (ref 0.00–0.07)
Basophils Absolute: 0 10*3/uL (ref 0.0–0.1)
Basophils Relative: 0 %
Eosinophils Absolute: 0.1 10*3/uL (ref 0.0–0.5)
Eosinophils Relative: 1 %
HCT: 35.8 % — ABNORMAL LOW (ref 36.0–46.0)
Hemoglobin: 11.8 g/dL — ABNORMAL LOW (ref 12.0–15.0)
Immature Granulocytes: 0 %
Lymphocytes Relative: 21 %
Lymphs Abs: 1.5 10*3/uL (ref 0.7–4.0)
MCH: 28.6 pg (ref 26.0–34.0)
MCHC: 33 g/dL (ref 30.0–36.0)
MCV: 86.9 fL (ref 80.0–100.0)
Monocytes Absolute: 0.6 10*3/uL (ref 0.1–1.0)
Monocytes Relative: 8 %
Neutro Abs: 4.8 10*3/uL (ref 1.7–7.7)
Neutrophils Relative %: 70 %
Platelets: 301 10*3/uL (ref 150–400)
RBC: 4.12 MIL/uL (ref 3.87–5.11)
RDW: 13.3 % (ref 11.5–15.5)
WBC: 6.9 10*3/uL (ref 4.0–10.5)
nRBC: 0 % (ref 0.0–0.2)

## 2021-11-17 SURGERY — CHOLECYSTECTOMY, ROBOT-ASSISTED, LAPAROSCOPIC
Anesthesia: General | Site: Abdomen

## 2021-11-17 MED ORDER — CEFAZOLIN SODIUM-DEXTROSE 2-4 GM/100ML-% IV SOLN
2.0000 g | INTRAVENOUS | Status: AC
  Administered 2021-11-17: 2 g via INTRAVENOUS

## 2021-11-17 MED ORDER — FAMOTIDINE 20 MG PO TABS
ORAL_TABLET | ORAL | Status: AC
  Administered 2021-11-17: 20 mg via ORAL
  Filled 2021-11-17: qty 1

## 2021-11-17 MED ORDER — APREPITANT 40 MG PO CAPS
40.0000 mg | ORAL_CAPSULE | Freq: Once | ORAL | Status: AC
Start: 2021-11-17 — End: 2021-11-17

## 2021-11-17 MED ORDER — CHLORHEXIDINE GLUCONATE CLOTH 2 % EX PADS: 6.0000 | MEDICATED_PAD | Freq: Once | CUTANEOUS | Status: DC

## 2021-11-17 MED ORDER — LIDOCAINE HCL (CARDIAC) PF 100 MG/5ML IV SOSY
PREFILLED_SYRINGE | INTRAVENOUS | Status: DC | PRN
Start: 2021-11-17 — End: 2021-11-17
  Administered 2021-11-17: 100 mg via INTRAVENOUS

## 2021-11-17 MED ORDER — CEFAZOLIN SODIUM-DEXTROSE 2-4 GM/100ML-% IV SOLN
INTRAVENOUS | Status: AC
  Filled 2021-11-17: qty 100

## 2021-11-17 MED ORDER — ACETAMINOPHEN 500 MG PO TABS: 1000.0000 mg | ORAL_TABLET | ORAL | Status: AC

## 2021-11-17 MED ORDER — GABAPENTIN 300 MG PO CAPS
ORAL_CAPSULE | ORAL | Status: AC
  Administered 2021-11-17: 300 mg via ORAL
  Filled 2021-11-17: qty 1

## 2021-11-17 MED ORDER — BUPIVACAINE LIPOSOME 1.3 % IJ SUSP
INTRAMUSCULAR | Status: AC
  Filled 2021-11-17: qty 20

## 2021-11-17 MED ORDER — LACTATED RINGERS IV SOLN: INTRAVENOUS | Status: DC

## 2021-11-17 MED ORDER — VASOPRESSIN 20 UNIT/ML IV SOLN
INTRAVENOUS | Status: DC | PRN
  Administered 2021-11-17: 1 [IU] via INTRAVENOUS

## 2021-11-17 MED ORDER — FAMOTIDINE 20 MG PO TABS: 20.0000 mg | ORAL_TABLET | Freq: Once | ORAL | Status: AC

## 2021-11-17 MED ORDER — BUPIVACAINE-EPINEPHRINE (PF) 0.25% -1:200000 IJ SOLN
INTRAMUSCULAR | Status: AC
  Filled 2021-11-17: qty 30

## 2021-11-17 MED ORDER — CHLORHEXIDINE GLUCONATE 0.12 % MT SOLN
OROMUCOSAL | Status: AC
Start: 2021-11-17 — End: 2021-11-17
  Administered 2021-11-17: 15 mL via OROMUCOSAL
  Filled 2021-11-17: qty 15

## 2021-11-17 MED ORDER — BUPIVACAINE-EPINEPHRINE (PF) 0.25% -1:200000 IJ SOLN
INTRAMUSCULAR | Status: DC | PRN
  Administered 2021-11-17: 30 mL

## 2021-11-17 MED ORDER — PROPOFOL 10 MG/ML IV BOLUS
INTRAVENOUS | Status: DC | PRN
Start: 2021-11-17 — End: 2021-11-17
  Administered 2021-11-17: 120 mg via INTRAVENOUS

## 2021-11-17 MED ORDER — DEXAMETHASONE SODIUM PHOSPHATE 10 MG/ML IJ SOLN
INTRAMUSCULAR | Status: DC | PRN
Start: 2021-11-17 — End: 2021-11-17
  Administered 2021-11-17: 10 mg via INTRAVENOUS

## 2021-11-17 MED ORDER — IBUPROFEN 800 MG PO TABS: 800.0000 mg | ORAL_TABLET | Freq: Three times a day (TID) | ORAL | 0 refills | Status: DC | PRN

## 2021-11-17 MED ORDER — GLYCOPYRROLATE 0.2 MG/ML IJ SOLN
INTRAMUSCULAR | Status: DC | PRN
  Administered 2021-11-17: .2 mg via INTRAVENOUS

## 2021-11-17 MED ORDER — INDOCYANINE GREEN 25 MG IV SOLR
1.2500 mg | Freq: Once | INTRAVENOUS | Status: AC
  Administered 2021-11-17: 1.25 mg via INTRAVENOUS
  Filled 2021-11-17: qty 0.5

## 2021-11-17 MED ORDER — SUGAMMADEX SODIUM 500 MG/5ML IV SOLN
INTRAVENOUS | Status: DC | PRN
  Administered 2021-11-17: 400 mg via INTRAVENOUS

## 2021-11-17 MED ORDER — FENTANYL CITRATE (PF) 100 MCG/2ML IJ SOLN
INTRAMUSCULAR | Status: AC
  Filled 2021-11-17: qty 2

## 2021-11-17 MED ORDER — CELECOXIB 200 MG PO CAPS: 200.0000 mg | ORAL_CAPSULE | ORAL | Status: AC

## 2021-11-17 MED ORDER — LACTATED RINGERS IV SOLN: INTRAVENOUS | Status: DC | PRN

## 2021-11-17 MED ORDER — BUPIVACAINE LIPOSOME 1.3 % IJ SUSP: 20.0000 mL | Freq: Once | INTRAMUSCULAR | Status: DC

## 2021-11-17 MED ORDER — 0.9 % SODIUM CHLORIDE (POUR BTL) OPTIME
TOPICAL | Status: DC | PRN
  Administered 2021-11-17: 500 mL

## 2021-11-17 MED ORDER — GABAPENTIN 300 MG PO CAPS: 300.0000 mg | ORAL_CAPSULE | ORAL | Status: AC

## 2021-11-17 MED ORDER — CHLORHEXIDINE GLUCONATE 0.12 % MT SOLN: 15.0000 mL | Freq: Once | OROMUCOSAL | Status: AC

## 2021-11-17 MED ORDER — DEXMEDETOMIDINE HCL IN NACL 200 MCG/50ML IV SOLN
INTRAVENOUS | Status: DC | PRN
  Administered 2021-11-17: 12 ug via INTRAVENOUS

## 2021-11-17 MED ORDER — ORAL CARE MOUTH RINSE: 15.0000 mL | Freq: Once | OROMUCOSAL | Status: AC

## 2021-11-17 MED ORDER — FENTANYL CITRATE (PF) 100 MCG/2ML IJ SOLN
INTRAMUSCULAR | Status: DC | PRN
  Administered 2021-11-17: 50 ug via INTRAVENOUS

## 2021-11-17 MED ORDER — ONDANSETRON HCL 4 MG/2ML IJ SOLN
INTRAMUSCULAR | Status: DC | PRN
  Administered 2021-11-17: 4 mg via INTRAVENOUS

## 2021-11-17 MED ORDER — HYDROCODONE-ACETAMINOPHEN 5-325 MG PO TABS: 1.0000 | ORAL_TABLET | Freq: Four times a day (QID) | ORAL | 0 refills | Status: DC | PRN

## 2021-11-17 MED ORDER — VASOPRESSIN 20 UNIT/ML IV SOLN
INTRAVENOUS | Status: AC
Start: 2021-11-17 — End: ?
  Filled 2021-11-17: qty 1

## 2021-11-17 MED ORDER — EPHEDRINE SULFATE (PRESSORS) 50 MG/ML IJ SOLN
INTRAMUSCULAR | Status: DC | PRN
Start: 2021-11-17 — End: 2021-11-17
  Administered 2021-11-17 (×3): 5 mg via INTRAVENOUS
  Administered 2021-11-17 (×2): 10 mg via INTRAVENOUS

## 2021-11-17 MED ORDER — ACETAMINOPHEN 500 MG PO TABS
ORAL_TABLET | ORAL | Status: AC
  Administered 2021-11-17: 1000 mg via ORAL
  Filled 2021-11-17: qty 2

## 2021-11-17 MED ORDER — EPHEDRINE 5 MG/ML INJ
INTRAVENOUS | Status: AC
  Filled 2021-11-17: qty 10

## 2021-11-17 MED ORDER — CELECOXIB 200 MG PO CAPS
ORAL_CAPSULE | ORAL | Status: AC
  Administered 2021-11-17: 200 mg via ORAL
  Filled 2021-11-17: qty 1

## 2021-11-17 MED ORDER — PHENYLEPHRINE HCL-NACL 20-0.9 MG/250ML-% IV SOLN
INTRAVENOUS | Status: DC | PRN
  Administered 2021-11-17: 100 ug/min via INTRAVENOUS

## 2021-11-17 MED ORDER — APREPITANT 40 MG PO CAPS
ORAL_CAPSULE | ORAL | Status: AC
  Administered 2021-11-17: 40 mg via ORAL
  Filled 2021-11-17: qty 1

## 2021-11-17 MED ORDER — ROCURONIUM BROMIDE 100 MG/10ML IV SOLN
INTRAVENOUS | Status: DC | PRN
  Administered 2021-11-17: 20 mg via INTRAVENOUS
  Administered 2021-11-17: 50 mg via INTRAVENOUS
  Administered 2021-11-17: 20 mg via INTRAVENOUS

## 2021-11-17 SURGICAL SUPPLY — 45 items
ADH SKN CLS APL DERMABOND .7 (GAUZE/BANDAGES/DRESSINGS) ×2
BAG RETRIEVAL 10 (BASKET) ×1
CANNULA CAP OBTURATR AIRSEAL 8 (CAP) ×1 IMPLANT
CLIP LIGATING HEM O LOK PURPLE (MISCELLANEOUS) ×3 IMPLANT
COVER TIP SHEARS 8 DVNC (MISCELLANEOUS) ×2 IMPLANT
COVER TIP SHEARS 8MM DA VINCI (MISCELLANEOUS) ×1
DERMABOND ADVANCED (GAUZE/BANDAGES/DRESSINGS) ×1
DERMABOND ADVANCED .7 DNX12 (GAUZE/BANDAGES/DRESSINGS) ×2 IMPLANT
DRAPE ARM DVNC X/XI (DISPOSABLE) ×8 IMPLANT
DRAPE COLUMN DVNC XI (DISPOSABLE) ×2 IMPLANT
DRAPE DA VINCI XI ARM (DISPOSABLE) ×4
DRAPE DA VINCI XI COLUMN (DISPOSABLE) ×1
ELECT CAUTERY BLADE 6.4 (BLADE) ×3 IMPLANT
GLOVE SURG ORTHO LTX SZ7.5 (GLOVE) ×6 IMPLANT
GOWN STRL REUS W/ TWL LRG LVL3 (GOWN DISPOSABLE) ×4 IMPLANT
GOWN STRL REUS W/ TWL XL LVL3 (GOWN DISPOSABLE) ×4 IMPLANT
GOWN STRL REUS W/TWL LRG LVL3 (GOWN DISPOSABLE) ×6
GOWN STRL REUS W/TWL XL LVL3 (GOWN DISPOSABLE) ×12
IRRIGATION STRYKERFLOW (MISCELLANEOUS) IMPLANT
IRRIGATOR STRYKERFLOW (MISCELLANEOUS) ×3
IV NS IRRIG 3000ML ARTHROMATIC (IV SOLUTION) ×1 IMPLANT
KIT PINK PAD W/HEAD ARE REST (MISCELLANEOUS) ×3
KIT PINK PAD W/HEAD ARM REST (MISCELLANEOUS) ×2 IMPLANT
LABEL OR SOLS (LABEL) ×3 IMPLANT
MANIFOLD NEPTUNE II (INSTRUMENTS) ×3 IMPLANT
NDL INSUFFLATION 14GA 120MM (NEEDLE) IMPLANT
NEEDLE HYPO 22GX1.5 SAFETY (NEEDLE) ×3 IMPLANT
NEEDLE INSUFFLATION 14GA 120MM (NEEDLE) ×3 IMPLANT
NS IRRIG 500ML POUR BTL (IV SOLUTION) ×3 IMPLANT
PACK LAP CHOLECYSTECTOMY (MISCELLANEOUS) ×3 IMPLANT
PENCIL ELECTRO HAND CTR (MISCELLANEOUS) ×3 IMPLANT
SCISSORS METZENBAUM CVD 33 (INSTRUMENTS) ×1 IMPLANT
SEAL CANN UNIV 5-8 DVNC XI (MISCELLANEOUS) ×8 IMPLANT
SEAL XI 5MM-8MM UNIVERSAL (MISCELLANEOUS) ×3
SET TUBE FILTERED XL AIRSEAL (SET/KITS/TRAYS/PACK) ×1 IMPLANT
SOLUTION ELECTROLUBE (MISCELLANEOUS) ×3 IMPLANT
SPIKE FLUID TRANSFER (MISCELLANEOUS) ×3 IMPLANT
SUT MNCRL 4-0 (SUTURE) ×6
SUT MNCRL 4-0 27XMFL (SUTURE) ×4
SUT VICRYL 0 AB UR-6 (SUTURE) ×3 IMPLANT
SUTURE MNCRL 4-0 27XMF (SUTURE) ×2 IMPLANT
SYS BAG RETRIEVAL 10MM (BASKET) ×2
SYSTEM BAG RETRIEVAL 10MM (BASKET) ×2 IMPLANT
TROCAR Z-THREAD FIOS 11X100 BL (TROCAR) ×1 IMPLANT
WATER STERILE IRR 500ML POUR (IV SOLUTION) ×3 IMPLANT

## 2021-11-17 NOTE — Anesthesia Procedure Notes (Signed)
Procedure Name: Intubation ?Date/Time: 11/17/2021 10:38 AM ?Performed by: Hedda Slade, CRNA ?Pre-anesthesia Checklist: Patient identified, Patient being monitored, Timeout performed, Emergency Drugs available and Suction available ?Patient Re-evaluated:Patient Re-evaluated prior to induction ?Oxygen Delivery Method: Circle system utilized ?Preoxygenation: Pre-oxygenation with 100% oxygen ?Induction Type: IV induction ?Ventilation: Mask ventilation without difficulty ?Laryngoscope Size: 3 and McGraph ?Grade View: Grade I ?Tube type: Oral ?Tube size: 7.0 mm ?Number of attempts: 1 ?Airway Equipment and Method: Stylet ?Placement Confirmation: ETT inserted through vocal cords under direct vision, positive ETCO2 and breath sounds checked- equal and bilateral ?Secured at: 21 cm ?Tube secured with: Tape ?Dental Injury: Teeth and Oropharynx as per pre-operative assessment  ? ? ? ? ?

## 2021-11-17 NOTE — Transfer of Care (Signed)
Immediate Anesthesia Transfer of Care Note ? ?[name] ? ?Procedure(s) Performed: XI ROBOTIC ASSISTED LAPAROSCOPIC CHOLECYSTECTOMY WITH BILIARY DRAIN REMOVAL (Abdomen) ?INDOCYANINE GREEN FLUORESCENCE IMAGING (ICG) ? ?Patient Location: PACU ? ?Anesthesia Type:General ? ?Level of Consciousness: drowsy ? ?Airway & Oxygen Therapy: Patient Spontanous Breathing and Patient connected to face mask oxygen ? ?Post-op Assessment: Report given to RN and Post -op Vital signs reviewed and stable ? ?Post vital signs: Reviewed and stable ? ?Last Vitals:  ?Vitals Value Taken Time  ?BP 116/49 11/17/21 1215  ?Temp 36.3 ?C 11/17/21 1215  ?Pulse 58 11/17/21 1226  ?Resp 25 11/17/21 1226  ?SpO2 96 % 11/17/21 1226  ?Vitals shown include unvalidated device data. ? ?Last Pain:  ?Vitals:  ? 11/17/21 1215  ?TempSrc:   ?PainSc: Asleep  ?   ? ?  ? ?Complications: No notable events documented. ?

## 2021-11-17 NOTE — Interval H&P Note (Signed)
History and Physical Interval Note: ? ?11/17/2021 ?9:51 AM ? ?Virginia Walters  has presented today for surgery, with the diagnosis of chronic calculous cholecystitis.  The various methods of treatment have been discussed with the patient and family. After consideration of risks, benefits and other options for treatment, the patient has consented to  Procedure(s): ?XI ROBOTIC ASSISTED LAPAROSCOPIC CHOLECYSTECTOMY (N/A) ?INDOCYANINE GREEN FLUORESCENCE IMAGING (ICG) (N/A) as a surgical intervention.  The patient's history has been reviewed, patient examined, no change in status, stable for surgery.  I have reviewed the patient's chart and labs.  Questions were answered to the patient's satisfaction.   ? ? ?Ronny Bacon ? ? ?

## 2021-11-17 NOTE — Op Note (Signed)
Robotic cholecystectomy with Indocyamine Green Ductal Imaging.  ? ?Pre-operative Diagnosis: Chronic calculus cholecystitis, history of acute cholecystitis status post percutaneous cholecystostomy. ? ?Post-operative Diagnosis:  Same. ? ?Procedure: Robotic assisted laparoscopic cholecystectomy with Indocyamine Green Ductal Imaging.  ? ?Surgeon: Ronny Bacon, M.D., FACS ? ?Anesthesia: General. with endotracheal tube ? ?Findings: As expected filmy scarring around gallbladder with tube within the gallbladder itself.  Excellent anatomic visualization with assistance of ICG imaging. ? ?Estimated Blood Loss: 15 mL ?        ?Drains: None ?        ?Specimens: Gallbladder     ?      ?Complications: none ? ?Procedure Details  ?The patient was seen again in the Holding Room.  2.5 mg dose of ICG was administered intravenously.   ?The benefits, complications, treatment options, risks and expected outcomes were again reviewed with the patient. The likelihood of improving the patient's symptoms with return to their baseline status is good.  The patient and/or family concurred with the proposed plan, giving informed consent, again alternatives reviewed.  The patient was taken to Operating Room, identified, and the procedure verified as robotic assisted laparoscopic cholecystectomy. ? ?Prior to the induction of general anesthesia, antibiotic prophylaxis was administered. VTE prophylaxis was in place. General endotracheal anesthesia was then administered and tolerated well. The patient was positioned in the supine position.  After the induction, the abdomen was prepped with Chloraprep and draped in the sterile fashion.  ?A Time Out was held and the above information confirmed. ? ?After local infiltration of quarter percent Marcaine with epinephrine, stab incision was made left upper quadrant.  Just below the costal margin at Palmer's point, approximately midclavicular line the Veres needle is passed with sensation of the layers to  penetrate the abdominal wall and into the peritoneum.  Saline drop test is confirmed peritoneal placement.  Insufflation is initiated with carbon dioxide to pressures of 15 mmHg. ? ?Right infra-umbilical local infiltration with quarter percent Marcaine with epinephrine is utilized.  Made a 12 mm incision on the right periumbilical site, I advanced an optical 40m port under direct visualization into the peritoneal cavity.  Once the peritoneum was penetrated, insufflation was initiated.  The trocar was then advanced into the abdominal cavity under direct visualization. ?Pneumoperitoneum was then continued with Air seal utilizing CO2 at 15 mmHg or less and tolerated well without any adverse changes in the patient's vital signs.  Two 8.5-mm ports were placed in the left lower quadrant and laterally, lysis of adhesions of the right lower quadrant were done prior to placement of the right lower quadrant port, a 8.5 mm robotic trocars placed in right lower quadrant, all under direct vision. All skin incisions  were infiltrated with a local anesthetic agent before making the incision and placing the trocars.  ?The patient was positioned  in reverse Trendelenburg, tilted the patient's left side down.  Da Vinci XI robot was then positioned on to the patient's left side, and docked. ? ?The gallbladder was identified, the surrounding scarring to the right upper quadrant and associated adhesions were lysed freeing the fundus, the drain was divided, and the fundus was grasped via the arm 4 Prograsp and retracted cephalad. Adhesions were lysed with scissors and cautery.  The infundibulum was identified grasped and retracted laterally, exposing the peritoneum overlying the triangle of Calot. This was then opened and dissected using cautery & scissors. An extended critical view of the cystic duct and cystic artery was obtained, aided by the ICG  via FireFly which enabled ready visualization of the ductal anatomy.   ? ?The cystic  duct was clearly identified and dissected to isolation.   Artery well isolated and clipped, and the cystic duct was triple clipped and divided with scissors, as close to the gallbladder neck as feasible, thus leaving two on the remaining stump.  The specimen side of the artery is sealed with bipolar and divided with monopolar scissors.  ? ?The gallbladder was taken from the gallbladder fossa in a retrograde fashion with the electrocautery. The gallbladder was removed and placed in an Endocatch bag.  ?The liver bed is inspected. Hemostasis was confirmed.  ?The robot was undocked and moved away from the operative field. ?3 L of normal saline irrigation was utilized and was aspirated clear.  The gallbladder and Endocatch sac were then removed through the infraumbilical port site.  ? ?Inspection of the right upper quadrant was performed. No bleeding, bile duct injury or leak, or bowel injury was noted. The infra-umbilical port site fascia was closed with interrumpted 0 Vicryl sutures using PMI/cone under direct visualization. Pneumoperitoneum was released and ports removed.  4-0 subcuticular Monocryl was used to close the skin. Dermabond was  applied.  The patient was then extubated and brought to the recovery room in stable condition. Sponge, lap, and needle counts were correct at closure and at the conclusion of the case.  ?The remaining drain from the right upper quadrant abdominal skin was then removed.  A Band-Aid was placed over the site. ?        ?     ?Ronny Bacon, M.D., FACS ?11/17/2021 12:20 PM  ?

## 2021-11-17 NOTE — Anesthesia Preprocedure Evaluation (Addendum)
Anesthesia Evaluation  ?Patient identified by MRN, date of birth, ID band ?Patient awake ? ?General Assessment Comment:A&O x4 ? ?Reviewed: ?Allergy & Precautions, H&P , NPO status , Patient's Chart, lab work & pertinent test results ? ?History of Anesthesia Complications ?Negative for: history of anesthetic complications ? ?Airway ?Mallampati: IV ? ?TM Distance: <3 FB ?Neck ROM: limited ? ? ? Dental ? ?(+) Missing ?  ?Pulmonary ?neg shortness of breath, COPD, former smoker,  ?  ?Pulmonary exam normal ? ? ? ? ? ? ? Cardiovascular ?Exercise Tolerance: Good ?hypertension, (-) angina+ CAD and +CHF (HFpEF)  ?(-) Past MI and (-) DOE Normal cardiovascular exam ? ?07/2018 ECHO: ?TTE 07/22/2018: EF 45-50%; mild LVH; mild diffuse HK; mild LA dilation; PASP 30-35 mmHg; G1DD ? ?PFO ? ?Myocardial perfusion imaging study performed on 10/19/2018 demonstrated a normal left ventricular systolic function with normal EF.  There was no evidence of stress-induced myocardial ischemia or arrhythmia. ?  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 GI/Hepatic ?Neg liver ROS, hiatal hernia (moderate), GERD  Controlled,  ?Endo/Other  ?negative endocrine ROS ? Renal/GU ?Renal InsufficiencyRenal disease  ?negative genitourinary ?  ?Musculoskeletal ? ? Abdominal ?(+) + obese,   ?Peds ? Hematology ? 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 ?Past Surgical History: ?No date: ANKLE ARTHROSCOPY WITH OPEN REDUCTION INTERNAL FIXATION  ?(ORIF); Right ?No date: APPENDECTOMY ?No date: TUBAL LIGATION ? ? ? ? Reproductive/Obstetrics ?negative OB ROS ? ?  ? ? ? ? ? ? ? ? ? ? ? ? ? ?  ?   ? ? ? ? ? ? ? ?Anesthesia Physical ? ?Anesthesia Plan ? ?ASA: III ? ?Anesthesia Plan: General  ? ?Post-op Pain Management: Regional block* and Tylenol PO (pre-op)*  ? ?Induction: Intravenous ? ?PONV Risk Score and Plan: 3 and Ondansetron, Dexamethasone and Treatment may vary due to age or medical condition ? ?Airway Management Planned: Oral ETT ? ?Additional Equipment:  ? ?Intra-op Plan:  ? ?Post-operative Plan: Extubation in OR ? ?Informed Consent: I have reviewed the patients History and Physical, chart, labs and discussed the procedure including the risks, benefits and alternatives for the proposed anesthesia with the patient or authorized representative who has indicated his/her understanding and acceptance.  ? ? ? ?Dental advisory given ? ?Plan Discussed with: Anesthesiologist, CRNA and Surgeon ? ?Anesthesia Plan Comments: (Patient consented for risks of anesthesia including but not limited to:  ?- adverse reactions to medications ?- risk of intubation if required ?- damage to eyes, teeth, lips or other oral mucosa ?- nerve damage due to positioning  ?- sore throat or hoarseness ?- Damage to heart, brain, nerves, lungs, other parts of body or loss of life ? ?Patient voiced understanding.)  ? ? ? ? ? ?Anesthesia Quick Evaluation ? ?

## 2021-11-17 NOTE — Anesthesia Postprocedure Evaluation (Signed)
Anesthesia Post Note ? ?[name] ? ?Procedure(s) Performed: XI ROBOTIC ASSISTED LAPAROSCOPIC CHOLECYSTECTOMY WITH BILIARY DRAIN REMOVAL (Abdomen) ?INDOCYANINE GREEN FLUORESCENCE IMAGING (ICG) ? ?Patient location during evaluation: PACU ?Anesthesia Type: General ?Level of consciousness: awake and alert ?Pain management: pain level controlled ?Vital Signs Assessment: post-procedure vital signs reviewed and stable ?Respiratory status: spontaneous breathing, nonlabored ventilation and respiratory function stable ?Cardiovascular status: blood pressure returned to baseline and stable ?Postop Assessment: no apparent nausea or vomiting ?Anesthetic complications: no ? ? ?No notable events documented. ? ? ?Last Vitals:  ?Vitals:  ? 11/17/21 1327 11/17/21 1400  ?BP: (!) 106/55 111/61  ?Pulse: 61 61  ?Resp: 20 20  ?Temp: (!) 36 ?C   ?SpO2: 91% 94%  ?  ?Last Pain:  ?Vitals:  ? 11/17/21 1400  ?TempSrc:   ?PainSc: 0-No pain  ? ? ?  ?  ?  ?  ?  ?  ? ?[place] Ouch ? ? ? ? ?

## 2021-11-17 NOTE — Discharge Instructions (Signed)

## 2021-11-18 LAB — SURGICAL PATHOLOGY

## 2021-11-22 ENCOUNTER — Telehealth: Payer: Medicare Other

## 2021-11-22 ENCOUNTER — Ambulatory Visit (INDEPENDENT_AMBULATORY_CARE_PROVIDER_SITE_OTHER): Payer: Medicare Other | Admitting: *Deleted

## 2021-11-22 ENCOUNTER — Telehealth: Payer: Self-pay | Admitting: *Deleted

## 2021-11-22 DIAGNOSIS — I5022 Chronic systolic (congestive) heart failure: Secondary | ICD-10-CM

## 2021-11-22 DIAGNOSIS — J449 Chronic obstructive pulmonary disease, unspecified: Secondary | ICD-10-CM

## 2021-11-22 NOTE — Chronic Care Management (AMB) (Signed)
? ?  11/22/2021 ? ?Virginia Walters ?Jun 05, 1941 ?[id number] ? ? ?Care plan updated/ resolved.  Case closed, unable to maintain contact, in basket message sent to [facility] pharmacist informing of case closure. ? ?Jacqlyn Larsen RNC, BSN ?RN Case Manager ?[place] ?[phone] ? ?

## 2021-11-22 NOTE — Telephone Encounter (Signed)
?  Care Management  ? ?Follow Up Note ? ? ?11/22/2021 ?Name: LUPITA ROSALES MRN: [id number] DOB: [birth year] ? ? ?Referred by: Susy Frizzle, MD ?Reason for referral : Chronic Care Management (CHF, COPD) ? ? ?Third unsuccessful telephone outreach was attempted today. The patient was referred to the case management team for assistance with care management and care coordination. The patient's primary care provider has been notified of our unsuccessful attempts to make or maintain contact with the patient. The care management team is pleased to engage with this patient at any time in the future should he/she be interested in assistance from the care management team.  ? ?Follow Up Plan: Telephone follow up appointment with care management team member scheduled for: 3 unsuccessful outreach attempts, case closed. ? ?Jacqlyn Larsen RNC, BSN ?RN Case Manager ?[place] ?[phone] ? ? ?

## 2021-11-25 DIAGNOSIS — D509 Iron deficiency anemia, unspecified: Secondary | ICD-10-CM | POA: Diagnosis not present

## 2021-11-25 DIAGNOSIS — Z87891 Personal history of nicotine dependence: Secondary | ICD-10-CM | POA: Diagnosis not present

## 2021-11-25 DIAGNOSIS — J449 Chronic obstructive pulmonary disease, unspecified: Secondary | ICD-10-CM | POA: Diagnosis not present

## 2021-11-25 DIAGNOSIS — E785 Hyperlipidemia, unspecified: Secondary | ICD-10-CM | POA: Diagnosis not present

## 2021-11-25 DIAGNOSIS — I5022 Chronic systolic (congestive) heart failure: Secondary | ICD-10-CM | POA: Diagnosis not present

## 2021-11-25 DIAGNOSIS — Z934 Other artificial openings of gastrointestinal tract status: Secondary | ICD-10-CM | POA: Diagnosis not present

## 2021-11-25 DIAGNOSIS — R413 Other amnesia: Secondary | ICD-10-CM | POA: Diagnosis not present

## 2021-11-25 DIAGNOSIS — Q2112 Patent foramen ovale: Secondary | ICD-10-CM | POA: Diagnosis not present

## 2021-11-25 DIAGNOSIS — Z9181 History of falling: Secondary | ICD-10-CM | POA: Diagnosis not present

## 2021-11-25 DIAGNOSIS — Z7951 Long term (current) use of inhaled steroids: Secondary | ICD-10-CM | POA: Diagnosis not present

## 2021-11-25 DIAGNOSIS — R296 Repeated falls: Secondary | ICD-10-CM | POA: Diagnosis not present

## 2021-11-25 DIAGNOSIS — N1831 Chronic kidney disease, stage 3a: Secondary | ICD-10-CM | POA: Diagnosis not present

## 2021-11-25 DIAGNOSIS — N179 Acute kidney failure, unspecified: Secondary | ICD-10-CM | POA: Diagnosis not present

## 2021-11-25 DIAGNOSIS — H34239 Retinal artery branch occlusion, unspecified eye: Secondary | ICD-10-CM | POA: Diagnosis not present

## 2021-11-25 DIAGNOSIS — K219 Gastro-esophageal reflux disease without esophagitis: Secondary | ICD-10-CM | POA: Diagnosis not present

## 2021-11-25 DIAGNOSIS — I130 Hypertensive heart and chronic kidney disease with heart failure and stage 1 through stage 4 chronic kidney disease, or unspecified chronic kidney disease: Secondary | ICD-10-CM | POA: Diagnosis not present

## 2021-11-27 DIAGNOSIS — J449 Chronic obstructive pulmonary disease, unspecified: Secondary | ICD-10-CM | POA: Diagnosis not present

## 2021-11-30 ENCOUNTER — Ambulatory Visit (INDEPENDENT_AMBULATORY_CARE_PROVIDER_SITE_OTHER): Payer: Medicare Other | Admitting: Physician Assistant

## 2021-11-30 ENCOUNTER — Encounter: Payer: Self-pay | Admitting: Physician Assistant

## 2021-11-30 VITALS — BP 117/79 | HR 87 | Temp 98.5°F | Ht 67.0 in | Wt 215.0 lb

## 2021-11-30 DIAGNOSIS — K819 Cholecystitis, unspecified: Secondary | ICD-10-CM

## 2021-11-30 DIAGNOSIS — Z09 Encounter for follow-up examination after completed treatment for conditions other than malignant neoplasm: Secondary | ICD-10-CM

## 2021-11-30 DIAGNOSIS — K8010 Calculus of gallbladder with chronic cholecystitis without obstruction: Secondary | ICD-10-CM

## 2021-11-30 NOTE — Progress Notes (Addendum)
[facility] ?POST-OP OFFICE VISIT ? ?11/30/2021 ? ?HPI: ?Virginia Walters is a 81 y.o. female 13 days s/p robotic assisted laparoscopic cholecystectomy for [facility] with Dr Christian Mate; Initially acute cholecystitis managed with percutaneous cholecystostomy tube.  ? ?She is doing exceptionally well ?No fever, chills, nausea, emesis, diarrhea, or abdominal pain ?Incisions are healing well ?She is tolerating PO without issues ?No other complaints ? ?Vital signs: ?BP 117/79   Pulse 87   Temp 98.5 ?F (36.9 ?C)   Ht '5\' 7"'$  (1.702 m)   Wt 215 lb (97.5 kg)   SpO2 97%   BMI 33.67 kg/m?   ? ?Physical Exam: ?Constitutional: Well appearing female, NAD ?Abdomen: Soft, non-tender, non-distended, no rebound/guarding ?Skin: Laparoscopic incisions are healing well, no erythema or drainage  ? ?Assessment/Plan: ?This is a 81 y.o. female 13 days s/p robotic assisted laparoscopic cholecystectomy for [facility] with Dr Christian Mate; Initially acute cholecystitis managed with percutaneous cholecystostomy tube.  ? ? - Pain control prn ? - Reviewed wound care recommendation ? - Reviewed lifting restrictions; 4 weeks total ? - Reviewed surgical pathology; Acute calculous cholecystitis ? - She can follow up on as needed basis; She understands to call with questions/concerns ? ?-- ?Edison Simon, PA-C ?[facility] ?11/30/2021, 2:35 PM ?M-F: 7am - 4pm ? ?

## 2021-11-30 NOTE — Patient Instructions (Signed)

## 2021-12-01 ENCOUNTER — Other Ambulatory Visit: Payer: Self-pay | Admitting: Family Medicine

## 2021-12-01 DIAGNOSIS — N179 Acute kidney failure, unspecified: Secondary | ICD-10-CM | POA: Diagnosis not present

## 2021-12-01 DIAGNOSIS — Z87891 Personal history of nicotine dependence: Secondary | ICD-10-CM | POA: Diagnosis not present

## 2021-12-01 DIAGNOSIS — D509 Iron deficiency anemia, unspecified: Secondary | ICD-10-CM | POA: Diagnosis not present

## 2021-12-01 DIAGNOSIS — J449 Chronic obstructive pulmonary disease, unspecified: Secondary | ICD-10-CM | POA: Diagnosis not present

## 2021-12-01 DIAGNOSIS — Z9181 History of falling: Secondary | ICD-10-CM | POA: Diagnosis not present

## 2021-12-01 DIAGNOSIS — Z934 Other artificial openings of gastrointestinal tract status: Secondary | ICD-10-CM | POA: Diagnosis not present

## 2021-12-01 DIAGNOSIS — Z7951 Long term (current) use of inhaled steroids: Secondary | ICD-10-CM | POA: Diagnosis not present

## 2021-12-01 DIAGNOSIS — Q2112 Patent foramen ovale: Secondary | ICD-10-CM | POA: Diagnosis not present

## 2021-12-01 DIAGNOSIS — I130 Hypertensive heart and chronic kidney disease with heart failure and stage 1 through stage 4 chronic kidney disease, or unspecified chronic kidney disease: Secondary | ICD-10-CM | POA: Diagnosis not present

## 2021-12-01 DIAGNOSIS — R413 Other amnesia: Secondary | ICD-10-CM | POA: Diagnosis not present

## 2021-12-01 DIAGNOSIS — N1831 Chronic kidney disease, stage 3a: Secondary | ICD-10-CM | POA: Diagnosis not present

## 2021-12-01 DIAGNOSIS — H34239 Retinal artery branch occlusion, unspecified eye: Secondary | ICD-10-CM | POA: Diagnosis not present

## 2021-12-01 DIAGNOSIS — K219 Gastro-esophageal reflux disease without esophagitis: Secondary | ICD-10-CM | POA: Diagnosis not present

## 2021-12-01 DIAGNOSIS — R296 Repeated falls: Secondary | ICD-10-CM | POA: Diagnosis not present

## 2021-12-01 DIAGNOSIS — E785 Hyperlipidemia, unspecified: Secondary | ICD-10-CM | POA: Diagnosis not present

## 2021-12-01 DIAGNOSIS — I5022 Chronic systolic (congestive) heart failure: Secondary | ICD-10-CM | POA: Diagnosis not present

## 2021-12-07 ENCOUNTER — Other Ambulatory Visit: Payer: Self-pay | Admitting: Family Medicine

## 2021-12-07 DIAGNOSIS — H34239 Retinal artery branch occlusion, unspecified eye: Secondary | ICD-10-CM | POA: Diagnosis not present

## 2021-12-07 DIAGNOSIS — I130 Hypertensive heart and chronic kidney disease with heart failure and stage 1 through stage 4 chronic kidney disease, or unspecified chronic kidney disease: Secondary | ICD-10-CM | POA: Diagnosis not present

## 2021-12-07 DIAGNOSIS — Z7951 Long term (current) use of inhaled steroids: Secondary | ICD-10-CM | POA: Diagnosis not present

## 2021-12-07 DIAGNOSIS — Z934 Other artificial openings of gastrointestinal tract status: Secondary | ICD-10-CM | POA: Diagnosis not present

## 2021-12-07 DIAGNOSIS — Q2112 Patent foramen ovale: Secondary | ICD-10-CM | POA: Diagnosis not present

## 2021-12-07 DIAGNOSIS — E785 Hyperlipidemia, unspecified: Secondary | ICD-10-CM | POA: Diagnosis not present

## 2021-12-07 DIAGNOSIS — N179 Acute kidney failure, unspecified: Secondary | ICD-10-CM | POA: Diagnosis not present

## 2021-12-07 DIAGNOSIS — J449 Chronic obstructive pulmonary disease, unspecified: Secondary | ICD-10-CM | POA: Diagnosis not present

## 2021-12-07 DIAGNOSIS — K219 Gastro-esophageal reflux disease without esophagitis: Secondary | ICD-10-CM | POA: Diagnosis not present

## 2021-12-07 DIAGNOSIS — R296 Repeated falls: Secondary | ICD-10-CM | POA: Diagnosis not present

## 2021-12-07 DIAGNOSIS — I5022 Chronic systolic (congestive) heart failure: Secondary | ICD-10-CM | POA: Diagnosis not present

## 2021-12-07 DIAGNOSIS — R413 Other amnesia: Secondary | ICD-10-CM | POA: Diagnosis not present

## 2021-12-07 DIAGNOSIS — N1831 Chronic kidney disease, stage 3a: Secondary | ICD-10-CM | POA: Diagnosis not present

## 2021-12-07 DIAGNOSIS — Z9181 History of falling: Secondary | ICD-10-CM | POA: Diagnosis not present

## 2021-12-07 DIAGNOSIS — D509 Iron deficiency anemia, unspecified: Secondary | ICD-10-CM | POA: Diagnosis not present

## 2021-12-07 DIAGNOSIS — Z87891 Personal history of nicotine dependence: Secondary | ICD-10-CM | POA: Diagnosis not present

## 2021-12-08 ENCOUNTER — Telehealth: Payer: Self-pay | Admitting: Family Medicine

## 2021-12-08 NOTE — Telephone Encounter (Signed)
Joya Gaskins PT, from [facility] called requesting additional Home Health Physical Therapy sessions. She is requesting for an additional 4 weeks at once a week. She states that the patient hasn't met all of their goals yet. You can leave a detailed message with verbal orders on her cell it is a confidential line. ? ?CB#[phone]  ?

## 2021-12-09 NOTE — Telephone Encounter (Signed)
Given verbal order approval to behalf of Dr. Dennard Schaumann, Joya Gaskins PT, from [facility] called requesting additional Home Health Physical Therapy sessions. She is requesting for an additional 4 weeks at once a week. She states that the patient hasn't met all of their goals yet. ? ? ?

## 2021-12-10 ENCOUNTER — Other Ambulatory Visit (HOSPITAL_COMMUNITY): Payer: Medicare Other

## 2021-12-12 DIAGNOSIS — I5022 Chronic systolic (congestive) heart failure: Secondary | ICD-10-CM

## 2021-12-12 DIAGNOSIS — J449 Chronic obstructive pulmonary disease, unspecified: Secondary | ICD-10-CM

## 2021-12-13 DIAGNOSIS — I129 Hypertensive chronic kidney disease with stage 1 through stage 4 chronic kidney disease, or unspecified chronic kidney disease: Secondary | ICD-10-CM | POA: Diagnosis not present

## 2021-12-13 DIAGNOSIS — R413 Other amnesia: Secondary | ICD-10-CM | POA: Diagnosis not present

## 2021-12-13 DIAGNOSIS — N1831 Chronic kidney disease, stage 3a: Secondary | ICD-10-CM | POA: Diagnosis not present

## 2021-12-13 DIAGNOSIS — R296 Repeated falls: Secondary | ICD-10-CM | POA: Diagnosis not present

## 2021-12-13 DIAGNOSIS — H34239 Retinal artery branch occlusion, unspecified eye: Secondary | ICD-10-CM | POA: Diagnosis not present

## 2021-12-13 DIAGNOSIS — I130 Hypertensive heart and chronic kidney disease with heart failure and stage 1 through stage 4 chronic kidney disease, or unspecified chronic kidney disease: Secondary | ICD-10-CM | POA: Diagnosis not present

## 2021-12-13 DIAGNOSIS — D509 Iron deficiency anemia, unspecified: Secondary | ICD-10-CM | POA: Diagnosis not present

## 2021-12-13 DIAGNOSIS — Q2112 Patent foramen ovale: Secondary | ICD-10-CM | POA: Diagnosis not present

## 2021-12-13 DIAGNOSIS — Z87891 Personal history of nicotine dependence: Secondary | ICD-10-CM | POA: Diagnosis not present

## 2021-12-13 DIAGNOSIS — Z934 Other artificial openings of gastrointestinal tract status: Secondary | ICD-10-CM | POA: Diagnosis not present

## 2021-12-13 DIAGNOSIS — K219 Gastro-esophageal reflux disease without esophagitis: Secondary | ICD-10-CM | POA: Diagnosis not present

## 2021-12-13 DIAGNOSIS — E785 Hyperlipidemia, unspecified: Secondary | ICD-10-CM | POA: Diagnosis not present

## 2021-12-13 DIAGNOSIS — J449 Chronic obstructive pulmonary disease, unspecified: Secondary | ICD-10-CM | POA: Diagnosis not present

## 2021-12-13 DIAGNOSIS — Z7951 Long term (current) use of inhaled steroids: Secondary | ICD-10-CM | POA: Diagnosis not present

## 2021-12-13 DIAGNOSIS — I5022 Chronic systolic (congestive) heart failure: Secondary | ICD-10-CM | POA: Diagnosis not present

## 2021-12-13 DIAGNOSIS — Z9181 History of falling: Secondary | ICD-10-CM | POA: Diagnosis not present

## 2021-12-13 DIAGNOSIS — N179 Acute kidney failure, unspecified: Secondary | ICD-10-CM | POA: Diagnosis not present

## 2021-12-15 DIAGNOSIS — H268 Other specified cataract: Secondary | ICD-10-CM | POA: Diagnosis not present

## 2021-12-15 DIAGNOSIS — H25812 Combined forms of age-related cataract, left eye: Secondary | ICD-10-CM | POA: Diagnosis not present

## 2021-12-24 DIAGNOSIS — Q2112 Patent foramen ovale: Secondary | ICD-10-CM | POA: Diagnosis not present

## 2021-12-24 DIAGNOSIS — Z934 Other artificial openings of gastrointestinal tract status: Secondary | ICD-10-CM | POA: Diagnosis not present

## 2021-12-24 DIAGNOSIS — N179 Acute kidney failure, unspecified: Secondary | ICD-10-CM | POA: Diagnosis not present

## 2021-12-24 DIAGNOSIS — I130 Hypertensive heart and chronic kidney disease with heart failure and stage 1 through stage 4 chronic kidney disease, or unspecified chronic kidney disease: Secondary | ICD-10-CM | POA: Diagnosis not present

## 2021-12-24 DIAGNOSIS — N1831 Chronic kidney disease, stage 3a: Secondary | ICD-10-CM | POA: Diagnosis not present

## 2021-12-24 DIAGNOSIS — E785 Hyperlipidemia, unspecified: Secondary | ICD-10-CM | POA: Diagnosis not present

## 2021-12-24 DIAGNOSIS — Z9181 History of falling: Secondary | ICD-10-CM | POA: Diagnosis not present

## 2021-12-24 DIAGNOSIS — I5022 Chronic systolic (congestive) heart failure: Secondary | ICD-10-CM | POA: Diagnosis not present

## 2021-12-24 DIAGNOSIS — K219 Gastro-esophageal reflux disease without esophagitis: Secondary | ICD-10-CM | POA: Diagnosis not present

## 2021-12-24 DIAGNOSIS — H34239 Retinal artery branch occlusion, unspecified eye: Secondary | ICD-10-CM | POA: Diagnosis not present

## 2021-12-24 DIAGNOSIS — R296 Repeated falls: Secondary | ICD-10-CM | POA: Diagnosis not present

## 2021-12-24 DIAGNOSIS — Z87891 Personal history of nicotine dependence: Secondary | ICD-10-CM | POA: Diagnosis not present

## 2021-12-24 DIAGNOSIS — J449 Chronic obstructive pulmonary disease, unspecified: Secondary | ICD-10-CM | POA: Diagnosis not present

## 2021-12-24 DIAGNOSIS — R413 Other amnesia: Secondary | ICD-10-CM | POA: Diagnosis not present

## 2021-12-24 DIAGNOSIS — Z7951 Long term (current) use of inhaled steroids: Secondary | ICD-10-CM | POA: Diagnosis not present

## 2021-12-24 DIAGNOSIS — D509 Iron deficiency anemia, unspecified: Secondary | ICD-10-CM | POA: Diagnosis not present

## 2021-12-27 DIAGNOSIS — J449 Chronic obstructive pulmonary disease, unspecified: Secondary | ICD-10-CM | POA: Diagnosis not present

## 2021-12-30 DIAGNOSIS — N179 Acute kidney failure, unspecified: Secondary | ICD-10-CM | POA: Diagnosis not present

## 2021-12-30 DIAGNOSIS — H34239 Retinal artery branch occlusion, unspecified eye: Secondary | ICD-10-CM | POA: Diagnosis not present

## 2021-12-30 DIAGNOSIS — Z7951 Long term (current) use of inhaled steroids: Secondary | ICD-10-CM | POA: Diagnosis not present

## 2021-12-30 DIAGNOSIS — Z87891 Personal history of nicotine dependence: Secondary | ICD-10-CM | POA: Diagnosis not present

## 2021-12-30 DIAGNOSIS — E785 Hyperlipidemia, unspecified: Secondary | ICD-10-CM | POA: Diagnosis not present

## 2021-12-30 DIAGNOSIS — I130 Hypertensive heart and chronic kidney disease with heart failure and stage 1 through stage 4 chronic kidney disease, or unspecified chronic kidney disease: Secondary | ICD-10-CM | POA: Diagnosis not present

## 2021-12-30 DIAGNOSIS — Z934 Other artificial openings of gastrointestinal tract status: Secondary | ICD-10-CM | POA: Diagnosis not present

## 2021-12-30 DIAGNOSIS — N1831 Chronic kidney disease, stage 3a: Secondary | ICD-10-CM | POA: Diagnosis not present

## 2021-12-30 DIAGNOSIS — D509 Iron deficiency anemia, unspecified: Secondary | ICD-10-CM | POA: Diagnosis not present

## 2021-12-30 DIAGNOSIS — R413 Other amnesia: Secondary | ICD-10-CM | POA: Diagnosis not present

## 2021-12-30 DIAGNOSIS — K219 Gastro-esophageal reflux disease without esophagitis: Secondary | ICD-10-CM | POA: Diagnosis not present

## 2021-12-30 DIAGNOSIS — J449 Chronic obstructive pulmonary disease, unspecified: Secondary | ICD-10-CM | POA: Diagnosis not present

## 2021-12-30 DIAGNOSIS — Z9181 History of falling: Secondary | ICD-10-CM | POA: Diagnosis not present

## 2021-12-30 DIAGNOSIS — R296 Repeated falls: Secondary | ICD-10-CM | POA: Diagnosis not present

## 2021-12-30 DIAGNOSIS — I5022 Chronic systolic (congestive) heart failure: Secondary | ICD-10-CM | POA: Diagnosis not present

## 2021-12-30 DIAGNOSIS — Q2112 Patent foramen ovale: Secondary | ICD-10-CM | POA: Diagnosis not present

## 2022-01-02 ENCOUNTER — Other Ambulatory Visit: Payer: Self-pay | Admitting: Family Medicine

## 2022-01-03 DIAGNOSIS — Z934 Other artificial openings of gastrointestinal tract status: Secondary | ICD-10-CM | POA: Diagnosis not present

## 2022-01-03 DIAGNOSIS — H34239 Retinal artery branch occlusion, unspecified eye: Secondary | ICD-10-CM | POA: Diagnosis not present

## 2022-01-03 DIAGNOSIS — Z9181 History of falling: Secondary | ICD-10-CM | POA: Diagnosis not present

## 2022-01-03 DIAGNOSIS — I5022 Chronic systolic (congestive) heart failure: Secondary | ICD-10-CM | POA: Diagnosis not present

## 2022-01-03 DIAGNOSIS — R413 Other amnesia: Secondary | ICD-10-CM | POA: Diagnosis not present

## 2022-01-03 DIAGNOSIS — N179 Acute kidney failure, unspecified: Secondary | ICD-10-CM | POA: Diagnosis not present

## 2022-01-03 DIAGNOSIS — D509 Iron deficiency anemia, unspecified: Secondary | ICD-10-CM | POA: Diagnosis not present

## 2022-01-03 DIAGNOSIS — Q2112 Patent foramen ovale: Secondary | ICD-10-CM | POA: Diagnosis not present

## 2022-01-03 DIAGNOSIS — R296 Repeated falls: Secondary | ICD-10-CM | POA: Diagnosis not present

## 2022-01-03 DIAGNOSIS — N1831 Chronic kidney disease, stage 3a: Secondary | ICD-10-CM | POA: Diagnosis not present

## 2022-01-03 DIAGNOSIS — Z87891 Personal history of nicotine dependence: Secondary | ICD-10-CM | POA: Diagnosis not present

## 2022-01-03 DIAGNOSIS — E785 Hyperlipidemia, unspecified: Secondary | ICD-10-CM | POA: Diagnosis not present

## 2022-01-03 DIAGNOSIS — I130 Hypertensive heart and chronic kidney disease with heart failure and stage 1 through stage 4 chronic kidney disease, or unspecified chronic kidney disease: Secondary | ICD-10-CM | POA: Diagnosis not present

## 2022-01-03 DIAGNOSIS — Z7951 Long term (current) use of inhaled steroids: Secondary | ICD-10-CM | POA: Diagnosis not present

## 2022-01-03 DIAGNOSIS — J449 Chronic obstructive pulmonary disease, unspecified: Secondary | ICD-10-CM | POA: Diagnosis not present

## 2022-01-03 DIAGNOSIS — K219 Gastro-esophageal reflux disease without esophagitis: Secondary | ICD-10-CM | POA: Diagnosis not present

## 2022-01-04 NOTE — Telephone Encounter (Signed)
Requested Prescriptions  Pending Prescriptions Disp Refills  . traZODone (DESYREL) 50 MG tablet [Pharmacy Med Name: traZODone HCl 50 MG Oral Tablet] 90 tablet 3    Sig: TAKE 1 TABLET BY MOUTH AT  BEDTIME     Psychiatry: Antidepressants - Serotonin Modulator Passed - 01/02/2022 11:03 PM      Passed - Valid encounter within last 6 months    Recent Outpatient Visits          2 months ago AKI (acute kidney injury) ([place])   [place] Susy Frizzle, MD   3 months ago Calculus of bile duct without cholecystitis with obstruction   [place] Susy Frizzle, MD   3 months ago Chest pain, unspecified type   [place] Eulogio Bear, NP   5 months ago Acute cystitis without hematuria   [place] Susy Frizzle, MD   1 year ago Iron deficiency anemia due to chronic blood loss   [place] Pickard, Cammie Mcgee, MD

## 2022-01-27 DIAGNOSIS — J449 Chronic obstructive pulmonary disease, unspecified: Secondary | ICD-10-CM | POA: Diagnosis not present

## 2022-02-26 DIAGNOSIS — J449 Chronic obstructive pulmonary disease, unspecified: Secondary | ICD-10-CM | POA: Diagnosis not present

## 2022-03-22 ENCOUNTER — Telehealth: Payer: Self-pay

## 2022-03-22 NOTE — Telephone Encounter (Signed)
Pt sent in a handicap placard form to be completed by pcp. Intake form and handicap placard form place in Nurse's folder. Please contact pt when form available to be mailed out or picked up.   Cb#: [phone]

## 2022-03-28 ENCOUNTER — Telehealth: Payer: Self-pay | Admitting: Pharmacist

## 2022-03-28 NOTE — Progress Notes (Signed)
Chronic Care Management Pharmacy Assistant   Name: Virginia Walters  MRN: [id number] DOB: 12-22-1940   Reason for Encounter: Disease State - Hypertension Call     Recent office visits:    Recent consult visits:    Hospital visits:  Medication Reconciliation was completed by comparing discharge summary, patient's EMR and Pharmacy list, and upon discussion with patient.  Admitted to the hospital on  due to . Discharge date was . Discharged from  [place]?Medications Started at [facility] Discharge:?? -started  due to   Medication Changes at Hospital Discharge: -Changed   Medications Discontinued at Hospital Discharge: -Stopped  due to   Medications that remain the same after Hospital Discharge:??  -All other medications will remain the same.    Medications: Outpatient Encounter Medications as of 03/28/2022  Medication Sig Note   Ascorbic Acid (VITAMIN C PO) Take by mouth.    BIOTIN PO Take 1 tablet by mouth daily.    BREZTRI AEROSPHERE 160-9-4.8 MCG/ACT AERO USE 2 INHALATIONS BY MOUTH INTO  THE LUNGS IN THE MORNING AND AT  BEDTIME    Calcium Carbonate-Vitamin D (CALCIUM + D PO) Take 1 capsule by mouth 2 (two) times daily.    Cholecalciferol (VITAMIN D) 2000 UNITS CAPS Take 2,000 Units by mouth daily.    clotrimazole-betamethasone (LOTRISONE) cream Apply 1 application topically 2 (two) times daily.    Cyanocobalamin (VITAMIN B-12 PO) Take by mouth.    dorzolamide (TRUSOPT) 2 % ophthalmic solution Place 1 drop into the left eye 2 (two) times daily.    doxazosin (CARDURA) 4 MG tablet Take 2 mg by mouth every morning.    feeding supplement (ENSURE SURGERY) LIQD Take 237 mLs by mouth 2 (two) times daily between meals. (Patient not taking: Reported on 10/27/2021)    fish oil-omega-3 fatty acids 1000 MG capsule Take 1,000 mg by mouth daily. 10/27/2021: On hold   HYDROcodone-acetaminophen (NORCO/VICODIN) 5-325 MG tablet Take 1 tablet by mouth every 6 (six) hours as needed  for moderate pain.    ibuprofen (ADVIL) 800 MG tablet Take 1 tablet (800 mg total) by mouth every 8 (eight) hours as needed.    latanoprost (XALATAN) 0.005 % ophthalmic solution Place 1 drop into the left eye at bedtime.     lisinopril (ZESTRIL) 40 MG tablet TAKE 1 TABLET BY MOUTH  DAILY (Patient not taking: Reported on 10/26/2021)    magnesium 30 MG tablet Take 30 mg by mouth daily.    melatonin 5 MG TABS Take 5 mg by mouth at bedtime.    Multiple Vitamin (MULTIVITAMIN) tablet Take 1 tablet by mouth daily.    potassium chloride (KLOR-CON) 10 MEQ tablet TAKE 1 TABLET BY MOUTH  DAILY    pravastatin (PRAVACHOL) 40 MG tablet TAKE 1 TABLET BY MOUTH  DAILY    traZODone (DESYREL) 50 MG tablet TAKE 1 TABLET BY MOUTH AT  BEDTIME    venlafaxine XR (EFFEXOR-XR) 75 MG 24 hr capsule TAKE 1 CAPSULE BY MOUTH  DAILY WITH BREAKFAST    verapamil (CALAN-SR) 240 MG CR tablet Take 240 mg by mouth every morning.    vitamin E 180 MG (400 UNITS) capsule Take 400 Units by mouth daily.    No facility-administered encounter medications on file as of 03/28/2022.   Current antihypertensive regimen:    How often are you checking your Blood Pressure?  Patient reported checking blood pressures  Current home BP readings:     What recent interventions/DTPs have been made  by any provider to improve Blood Pressure control since last CPP Visit:     Any recent hospitalizations or ED visits since last visit with CPP?    What diet changes have been made to improve Blood Pressure Control?     What exercise is being done to improve your Blood Pressure Control?      Adherence Review: Is the patient currently on ACE/ARB medication?  Does the patient have >5 day gap between last estimated fill dates?       Care Gaps  AWV: unknown  Colonoscopy: done 04/01/20 DM Eye Exam: N/A DM Foot Exam: N/A Microalbumin: N/A HbgAIC: N/A DEXA: N/A Mammogram: done 12/10/15   Star Rating Drugs: Pravastatin (PRAVACHOL) 40 MG  tablet - last filled 03/19/22 90 days  Lisinopril (ZESTRIL) 40 MG tablet - last filled 03/19/22 90 days     Future Appointments  Date Time Provider [place]  05/13/2022 10:30 AM [facility]-NURSE HEALTH ADVISOR [facility]   Multiple attempts were made to contact patient. Attempts were unsuccessful. / ls,CMA    Jobe Gibbon, [place] Pharmacist Assistant  [phone]

## 2022-03-29 DIAGNOSIS — J449 Chronic obstructive pulmonary disease, unspecified: Secondary | ICD-10-CM | POA: Diagnosis not present

## 2022-04-29 ENCOUNTER — Other Ambulatory Visit: Payer: Self-pay | Admitting: Family Medicine

## 2022-04-29 DIAGNOSIS — J449 Chronic obstructive pulmonary disease, unspecified: Secondary | ICD-10-CM | POA: Diagnosis not present

## 2022-05-06 ENCOUNTER — Telehealth: Payer: Self-pay

## 2022-05-06 NOTE — Telephone Encounter (Signed)
A rep from [place] came into office to make pcp aware that this pt has called [facility] to let them know that she no longer uses oxygen. Due to pt calling [facility] herself [facility] is no longer billing for oxygen. The rep from [place] is afraid that pt may have been confused about who to call about not using the oxygen. [facility] would like a phone call back about this pt's usage of oxygen please to help with the decision in continuing not to bill for this medical equipment. Please advise.

## 2022-05-13 ENCOUNTER — Other Ambulatory Visit: Payer: Self-pay | Admitting: Family Medicine

## 2022-05-13 VITALS — Ht 67.0 in | Wt 215.0 lb

## 2022-05-13 DIAGNOSIS — I10 Essential (primary) hypertension: Secondary | ICD-10-CM

## 2022-05-13 NOTE — Progress Notes (Signed)
Called pt twice and both times after saying good moring, the phone line doesn't work or she is not answering/responding to my questions. Msg to send to scheduler to schedule in office appt

## 2022-05-16 NOTE — Telephone Encounter (Signed)
Requested medication (s) are due for refill today: yes (mail order)  Requested medication (s) are on the active medication list: yes  Last refill:  06/07/21 #90 and 3 RF  Future visit scheduled: yes, 05/25/22  Notes to clinic:  On med list there is a statement that pt says she is not taking this, has appt soon, please assess.      Requested Prescriptions  Pending Prescriptions Disp Refills   lisinopril (ZESTRIL) 40 MG tablet [Pharmacy Med Name: Lisinopril 40 MG Oral Tablet] 90 tablet 3    Sig: TAKE 1 TABLET BY MOUTH  DAILY     Cardiovascular:  ACE Inhibitors Failed - 05/13/2022 10:36 PM      Failed - Cr in normal range and within 180 days    Creat  Date Value Ref Range Status  10/14/2021 1.37 (H) 0.60 - 0.95 mg/dL Final   Creatinine, Ser  Date Value Ref Range Status  11/17/2021 1.13 (H) 0.44 - 1.00 mg/dL Final   Creatinine, Urine  Date Value Ref Range Status  10/08/2021 81 mg/dL Final    Comment:    Performed at [facility], [address], [place], [place] [address]         Failed - Valid encounter within last 6 months    Recent Outpatient Visits           7 months ago AKI (acute kidney injury) ([place])   [place] Susy Frizzle, MD   7 months ago Calculus of bile duct without cholecystitis with obstruction   [place] Susy Frizzle, MD   8 months ago Chest pain, unspecified type   [place] Eulogio Bear, NP   9 months ago Acute cystitis without hematuria   [place] Susy Frizzle, MD   1 year ago Iron deficiency anemia due to chronic blood loss   [place] Pickard, Cammie Mcgee, MD              Passed - K in normal range and within 180 days    Potassium  Date Value Ref Range Status  11/17/2021 3.8 3.5 - 5.1 mmol/L Final  07/22/2013 4.2 3.5 - 5.1 mmol/L Final         Passed - Patient is not pregnant      Passed - Last BP in normal range     BP Readings from Last 1 Encounters:  11/30/21 117/79

## 2022-05-25 ENCOUNTER — Ambulatory Visit (INDEPENDENT_AMBULATORY_CARE_PROVIDER_SITE_OTHER): Payer: Medicare Other

## 2022-05-25 VITALS — Ht 67.0 in | Wt 206.0 lb

## 2022-05-25 DIAGNOSIS — Z0000 Encounter for general adult medical examination without abnormal findings: Secondary | ICD-10-CM

## 2022-05-25 NOTE — Patient Instructions (Signed)
Virginia Walters , Thank you for taking time to come for your Medicare Wellness Visit. I appreciate your ongoing commitment to your health goals. Please review the following plan we discussed and let me know if I can assist you in the future.   These are the goals we discussed:  Goals      DIET - INCREASE WATER INTAKE     Exercise 150 min/wk Moderate Activity     Pharmacy Care Plan:     CARE PLAN ENTRY (see longitudinal plan of care for additional care plan information)  Current Barriers:  Chronic Disease Management support, education, and care coordination needs related to Hypertension, Hyperlipidemia, COPD, and anemia , dementia.   Hypertension BP Readings from Last 3 Encounters:  04/01/20 (!) 146/87  03/04/20 (!) 157/82  02/21/20 120/70  Pharmacist Clinical Goal(s): Over the next 180 days, patient will work with PharmD and providers to achieve BP goal <140/90 Current regimen:  Lisinopril '40mg'$  HCTZ '25mg'$  daily Verapamil '240mg'$  hs Interventions: Reviewed home monitoring  Recommended patient monitor BP at home Patient self care activities - Over the next 180 days, patient will: Check BP periodically, document, and provide at future appointments Ensure daily salt intake < 2300 mg/day Contact providers with BP consistently > 140/90  Hyperlipidemia Lab Results  Component Value Date/Time   [facility] 64 03/06/2017 09:11 AM  Pharmacist Clinical Goal(s): Over the next 180 days, patient will work with PharmD and providers to maintain LDL goal < 100 Current regimen:  Pravastatin '40mg'$  Interventions: Reviewed most recent lipid panel. Counseled on importance of medication adherence. Patient self care activities - Over the next 180 days, patient will: Continue to focus on medication adherence by pill box  Anemia Pharmacist Clinical Goal(s) Over the next 180 days, patient will work with PharmD and providers to optimize medication related to anemia. Current regimen:  Ferrous sulfate '325mg'$   twice daily Interventions: Discussed OTC iron and Rx iron were the same Recommended Colace prn to help with constipation Patient self care activities - Over the next 180 days, patient will: Take ferrous sulfate '325mg'$  daily along with colace May titrate to twice daily if tolerated until next Hgb result COPD Pharmacist Clinical Goal(s) Over the next 180 days, patient will work with PharmD and providers to optimize medication and minimize symptoms related to COPD. Current regimen:  Symbicort 160-4.35mg Interventions: Discussed proper use of maintenance inhalers. Patient self care activities - Over the next 180 days, patient will: Continue to take medication as prescribed. Contact providers with any sudden change in symptoms or increased SOB. Dementia Pharmacist Clinical Goal(s) Over the next 180 days, patient will work with PharmD and providers to optimize medication and minimize symptoms related to dementia. Current regimen:  None Interventions: Discussed potential side effects of Donepezil. Patient self care activities - Over the next 180 days, patient will: Continue to monitor herself and reports any change in symptoms to providers.  Initial goal documentation      Track and Manage My Blood Pressure-Hypertension     Timeframe:  Long-Range Goal Priority:  High Start Date:   01/28/21                          Expected End Date:   07/30/21                    Follow Up Date 05/14/21    - check blood pressure 3 times per week - choose a place to take my  blood pressure (home, clinic or office, retail store) - write blood pressure results in a log or diary    Why is this important?   You won't feel high blood pressure, but it can still hurt your blood vessels.  High blood pressure can cause heart or kidney problems. It can also cause a stroke.  Making lifestyle changes like losing a little weight or eating less salt will help.  Checking your blood pressure at home and at  different times of the day can help to control blood pressure.  If the doctor prescribes medicine remember to take it the way the doctor ordered.  Call the office if you cannot afford the medicine or if there are questions about it.     Notes:         This is a list of the screening recommended for you and due dates:  Health Maintenance  Topic Date Due   Flu Shot  08/14/2022*   Zoster (Shingles) Vaccine (1 of 2) 08/25/2022*   Pneumonia Vaccine (2 - PCV) 05/26/2023*   Tetanus Vaccine  05/26/2023*   COVID-19 Vaccine (3 - Moderna risk series) 06/15/2023*   Colon Cancer Screening  04/02/2027   DEXA scan (bone density measurement)  Completed   HPV Vaccine  Aged Out  *Topic was postponed. The date shown is not the original due date.    Advanced directives: Advance directive discussed with you today. I have provided a copy for you to complete at home and have notarized. Once this is complete please bring a copy in to our office so we can scan it into your chart.   Conditions/risks identified: Aim for 30 minutes of exercise or brisk walking, 6-8 glasses of water, and 5 servings of fruits and vegetables each day.   Next appointment: Follow up in one year for your annual wellness visit 05/2023.   Preventive Care 42 Years and Older, Female Preventive care refers to lifestyle choices and visits with your health care provider that can promote health and wellness. What does preventive care include? A yearly physical exam. This is also called an annual well check. Dental exams once or twice a year. Routine eye exams. Ask your health care provider how often you should have your eyes checked. Personal lifestyle choices, including: Daily care of your teeth and gums. Regular physical activity. Eating a healthy diet. Avoiding tobacco and drug use. Limiting alcohol use. Practicing safe sex. Taking low-dose aspirin every day. Taking vitamin and mineral supplements as recommended by your  health care provider. What happens during an annual well check? The services and screenings done by your health care provider during your annual well check will depend on your age, overall health, lifestyle risk factors, and family history of disease. Counseling  Your health care provider may ask you questions about your: Alcohol use. Tobacco use. Drug use. Emotional well-being. Home and relationship well-being. Sexual activity. Eating habits. History of falls. Memory and ability to understand (cognition). Work and work Statistician. Reproductive health. Screening  You may have the following tests or measurements: Height, weight, and BMI. Blood pressure. Lipid and cholesterol levels. These may be checked every 5 years, or more frequently if you are over 84 years old. Skin check. Lung cancer screening. You may have this screening every year starting at age 21 if you have a 30-pack-year history of smoking and currently smoke or have quit within the past 15 years. Fecal occult blood test (FOBT) of the stool. You may have this test every  year starting at age 36. Flexible sigmoidoscopy or colonoscopy. You may have a sigmoidoscopy every 5 years or a colonoscopy every 10 years starting at age 67. Hepatitis C blood test. Hepatitis B blood test. Sexually transmitted disease (STD) testing. Diabetes screening. This is done by checking your blood sugar (glucose) after you have not eaten for a while (fasting). You may have this done every 1-3 years. Bone density scan. This is done to screen for osteoporosis. You may have this done starting at age 36. Mammogram. This may be done every 1-2 years. Talk to your health care provider about how often you should have regular mammograms. Talk with your health care provider about your test results, treatment options, and if necessary, the need for more tests. Vaccines  Your health care provider may recommend certain vaccines, such as: Influenza vaccine.  This is recommended every year. Tetanus, diphtheria, and acellular pertussis (Tdap, Td) vaccine. You may need a Td booster every 10 years. Zoster vaccine. You may need this after age 91. Pneumococcal 13-valent conjugate (PCV13) vaccine. One dose is recommended after age 89. Pneumococcal polysaccharide (PPSV23) vaccine. One dose is recommended after age 47. Talk to your health care provider about which screenings and vaccines you need and how often you need them. This information is not intended to replace advice given to you by your health care provider. Make sure you discuss any questions you have with your health care provider. Document Released: 08/28/2015 Document Revised: 04/20/2016 Document Reviewed: 06/02/2015 Elsevier Interactive Patient Education  2017 [employer] Prevention in the Home Falls can cause injuries. They can happen to people of all ages. There are many things you can do to make your home safe and to help prevent falls. What can I do on the outside of my home? Regularly fix the edges of walkways and driveways and fix any cracks. Remove anything that might make you trip as you walk through a door, such as a raised step or threshold. Trim any bushes or trees on the path to your home. Use bright outdoor lighting. Clear any walking paths of anything that might make someone trip, such as rocks or tools. Regularly check to see if handrails are loose or broken. Make sure that both sides of any steps have handrails. Any raised decks and porches should have guardrails on the edges. Have any leaves, snow, or ice cleared regularly. Use sand or salt on walking paths during winter. Clean up any spills in your garage right away. This includes oil or grease spills. What can I do in the bathroom? Use night lights. Install grab bars by the toilet and in the tub and shower. Do not use towel bars as grab bars. Use non-skid mats or decals in the tub or shower. If you need to sit  down in the shower, use a plastic, non-slip stool. Keep the floor dry. Clean up any water that spills on the floor as soon as it happens. Remove soap buildup in the tub or shower regularly. Attach bath mats securely with double-sided non-slip rug tape. Do not have throw rugs and other things on the floor that can make you trip. What can I do in the bedroom? Use night lights. Make sure that you have a light by your bed that is easy to reach. Do not use any sheets or blankets that are too big for your bed. They should not hang down onto the floor. Have a firm chair that has side arms. You can use this  for support while you get dressed. Do not have throw rugs and other things on the floor that can make you trip. What can I do in the kitchen? Clean up any spills right away. Avoid walking on wet floors. Keep items that you use a lot in easy-to-reach places. If you need to reach something above you, use a strong step stool that has a grab bar. Keep electrical cords out of the way. Do not use floor polish or wax that makes floors slippery. If you must use wax, use non-skid floor wax. Do not have throw rugs and other things on the floor that can make you trip. What can I do with my stairs? Do not leave any items on the stairs. Make sure that there are handrails on both sides of the stairs and use them. Fix handrails that are broken or loose. Make sure that handrails are as long as the stairways. Check any carpeting to make sure that it is firmly attached to the stairs. Fix any carpet that is loose or worn. Avoid having throw rugs at the top or bottom of the stairs. If you do have throw rugs, attach them to the floor with carpet tape. Make sure that you have a light switch at the top of the stairs and the bottom of the stairs. If you do not have them, ask someone to add them for you. What else can I do to help prevent falls? Wear shoes that: Do not have high heels. Have rubber bottoms. Are  comfortable and fit you well. Are closed at the toe. Do not wear sandals. If you use a stepladder: Make sure that it is fully opened. Do not climb a closed stepladder. Make sure that both sides of the stepladder are locked into place. Ask someone to hold it for you, if possible. Clearly mark and make sure that you can see: Any grab bars or handrails. First and last steps. Where the edge of each step is. Use tools that help you move around (mobility aids) if they are needed. These include: Canes. Walkers. Scooters. Crutches. Turn on the lights when you go into a dark area. Replace any light bulbs as soon as they burn out. Set up your furniture so you have a clear path. Avoid moving your furniture around. If any of your floors are uneven, fix them. If there are any pets around you, be aware of where they are. Review your medicines with your doctor. Some medicines can make you feel dizzy. This can increase your chance of falling. Ask your doctor what other things that you can do to help prevent falls. This information is not intended to replace advice given to you by your health care provider. Make sure you discuss any questions you have with your health care provider. Document Released: 05/28/2009 Document Revised: 01/07/2016 Document Reviewed: 09/05/2014 Elsevier Interactive Patient Education  2017 [employer].

## 2022-05-25 NOTE — Progress Notes (Signed)
Subjective:   Virginia Walters is a 81 y.o. female who presents for Medicare Annual (Subsequent) preventive examination.  Review of Systems     Cardiac Risk Factors include: advanced age (>61mn, >>75women);hypertension;dyslipidemia;sedentary lifestyle;obesity (BMI >30kg/m2)     Objective:    Today's Vitals   05/25/22 0928  Weight: 206 lb (93.4 kg)  Height: '5\' 7"'$  (1.702 m)   Body mass index is 32.26 kg/m.     05/25/2022    9:35 AM 11/17/2021    9:48 AM 10/28/2021    3:18 PM 10/18/2021   12:57 PM 10/07/2021    9:53 PM 10/07/2021    9:42 AM 09/16/2021    3:42 PM  Advanced Directives  Does Patient Have a Medical Advance Directive? No Yes Yes Yes No No No  Type of ACorporate treasurerof [place] will  [place] will     Does patient want to make changes to medical advance directive?  No - Patient declined  Yes (ED - send information to MyChart)     Copy of HDona Anain Chart?  No - copy requested  No - copy requested     Would patient like information on creating a medical advance directive? Yes (MAU/Ambulatory/Procedural Areas - Information given)   No - Patient declined No - Patient declined No - Patient declined No - Patient declined    Current Medications (verified) Outpatient Encounter Medications as of 05/25/2022  Medication Sig   Ascorbic Acid (VITAMIN C PO) Take by mouth.   BIOTIN PO Take 1 tablet by mouth daily.   BREZTRI AEROSPHERE 160-9-4.8 MCG/ACT AERO USE 2 INHALATIONS BY MOUTH INTO  THE LUNGS IN THE MORNING AND AT  BEDTIME   Calcium Carbonate-Vitamin D (CALCIUM + D PO) Take 1 capsule by mouth 2 (two) times daily.   Cholecalciferol (VITAMIN D) 2000 UNITS CAPS Take 2,000 Units by mouth daily.   clotrimazole-betamethasone (LOTRISONE) cream Apply 1 application topically 2 (two) times daily.   Cyanocobalamin (VITAMIN B-12 PO) Take by mouth.   dorzolamide (TRUSOPT) 2 % ophthalmic solution Place 1 drop  into the left eye 2 (two) times daily.   doxazosin (CARDURA) 4 MG tablet TAKE ONE-HALF TABLET BY  MOUTH DAILY   feeding supplement (ENSURE SURGERY) LIQD Take 237 mLs by mouth 2 (two) times daily between meals.   fish oil-omega-3 fatty acids 1000 MG capsule Take 1,000 mg by mouth daily.   HYDROcodone-acetaminophen (NORCO/VICODIN) 5-325 MG tablet Take 1 tablet by mouth every 6 (six) hours as needed for moderate pain.   ibuprofen (ADVIL) 800 MG tablet Take 1 tablet (800 mg total) by mouth every 8 (eight) hours as needed.   latanoprost (XALATAN) 0.005 % ophthalmic solution Place 1 drop into the left eye at bedtime.    lisinopril (ZESTRIL) 40 MG tablet TAKE 1 TABLET BY MOUTH  DAILY   magnesium 30 MG tablet Take 30 mg by mouth daily.   melatonin 5 MG TABS Take 5 mg by mouth at bedtime.   Multiple Vitamin (MULTIVITAMIN) tablet Take 1 tablet by mouth daily.   potassium chloride (KLOR-CON) 10 MEQ tablet TAKE 1 TABLET BY MOUTH  DAILY   pravastatin (PRAVACHOL) 40 MG tablet TAKE 1 TABLET BY MOUTH  DAILY   traZODone (DESYREL) 50 MG tablet TAKE 1 TABLET BY MOUTH AT  BEDTIME   venlafaxine XR (EFFEXOR-XR) 75 MG 24 hr capsule TAKE 1 CAPSULE BY MOUTH  DAILY WITH BREAKFAST   verapamil (CALAN-SR) 240 MG  CR tablet Take 240 mg by mouth every morning.   vitamin E 180 MG (400 UNITS) capsule Take 400 Units by mouth daily.   No facility-administered encounter medications on file as of 05/25/2022.    Allergies (verified) Oxycodone   History: Past Medical History:  Diagnosis Date   (HFpEF) heart failure with preserved ejection fraction ([place])    a.) TTE 07/22/2018: EF 45-50%; mild LVH; mild diffuse HK; mild LA dilation; PASP 30-35 mmHg; G1DD.   Alcohol abuse    Alcohol dependence with alcohol-induced mood disorder ([place])    Anemia    Aortic atherosclerosis ([place])    Arthritis    Brief psychotic disorder ([place]) 10/28/2016   a.) auditory hallucinations (people arguing in home who were not there); visual  hallucinations (seeing dead husband); patient was involuntarily committed/admitted to [place].   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   Weakness    Past Surgical History:  Procedure Laterality Date   ANKLE ARTHROSCOPY WITH OPEN REDUCTION INTERNAL FIXATION (ORIF) Right    APPENDECTOMY     CHOLECYSTECTOMY     COLONOSCOPY N/A 07/21/2018   Procedure: COLONOSCOPY;  Surgeon: Lin Landsman, MD;  Location: [facility] ENDOSCOPY;  Service: Gastroenterology;  Laterality: N/A;   COLONOSCOPY WITH PROPOFOL N/A 04/01/2020   Procedure: COLONOSCOPY WITH PROPOFOL;  Surgeon: Virgel Manifold, MD;  Location: [facility] ENDOSCOPY;  Service: Endoscopy;  Laterality: N/A;   ESOPHAGOGASTRODUODENOSCOPY N/A 07/21/2018   Procedure: ESOPHAGOGASTRODUODENOSCOPY (EGD);  Surgeon: Lin Landsman, MD;  Location: [facility] ENDOSCOPY;  Service: Gastroenterology;  Laterality: N/A;   ESOPHAGOGASTRODUODENOSCOPY N/A 02/11/2020   Procedure: ESOPHAGOGASTRODUODENOSCOPY (EGD);  Surgeon: Virgel Manifold, MD;  Location: [facility] ENDOSCOPY;  Service: Endoscopy;  Laterality: N/A;   ESOPHAGOGASTRODUODENOSCOPY (EGD) WITH PROPOFOL N/A 04/01/2020   Procedure: ESOPHAGOGASTRODUODENOSCOPY (EGD) WITH PROPOFOL;  Surgeon: Virgel Manifold, MD;  Location: [facility] ENDOSCOPY;  Service: Endoscopy;  Laterality: N/A;   FRACTURE SURGERY Left    leg fx   IR EXCHANGE BILIARY DRAIN  10/22/2021    IR PERC CHOLECYSTOSTOMY  09/17/2021   TUBAL LIGATION     Family History  Problem Relation Age of Onset   Esophageal cancer Father    Dementia Mother    Social History   Socioeconomic History   Marital status: Widowed    Spouse name: Not on file   Number of children: 1   Years of education: HS   Highest education level: Not on file  Occupational History   Occupation: Retired  Tobacco Use   Smoking status: Former    Packs/day: 1.00    Years: 15.00    Total pack years: 15.00    Types: Cigarettes    Quit date: 12/27/1980    Years since quitting: 41.4   Smokeless tobacco: Never  Vaping Use   Vaping  Use: Never used  Substance and Sexual Activity   Alcohol use: Not Currently    Comment: occassionally   Drug use: No   Sexual activity: Not Currently  Other Topics Concern   Not on file  Social History Narrative   Lives at home alone. Husband passed away 2012-11-02.   Right-handed.   No daily use of caffeine.   Daughter lives in [place].   Social Determinants of Health   Financial Resource Strain: Low Risk  (05/25/2022)   Overall Financial Resource Strain (CARDIA)    Difficulty of Paying Living Expenses: Not hard at all  Food Insecurity: No Food Insecurity (05/25/2022)   Hunger Vital Sign    Worried About Running Out of Food in the Last Year: Never true    Ran Out of Food in the Last Year: Never true  Transportation Needs: No Transportation Needs (05/25/2022)   PRAPARE - Hydrologist (Medical): No    Lack of Transportation (Non-Medical): No  Physical Activity: Inactive (05/25/2022)   Exercise Vital Sign    Days of Exercise per Week: 0 days    Minutes of Exercise per Session: 0 min  Stress: No Stress Concern Present (05/25/2022)   [place]    Feeling of Stress : Not at all  Social Connections: [place] (05/25/2022)   Social Connection and Isolation Panel [NHANES]     Frequency of Communication with Friends and Family: More than three times a week    Frequency of Social Gatherings with Friends and Family: More than three times a week    Attends Religious Services: More than 4 times per year    Active Member of [employer] or Organizations: Yes    Attends Music therapist: More than 4 times per year    Marital Status: Married    Tobacco Counseling Counseling given: Not Answered   Clinical Intake:  Pre-visit preparation completed: Yes  Pain : No/denies pain     BMI - recorded: 32.26 Nutritional Status: BMI > 30  Obese Nutritional Risks: None Diabetes: No  How often do you need to have someone help you when you read instructions, pamphlets, or other written materials from your doctor or pharmacy?: 1 - Never  Diabetic?NO   Interpreter Needed?: No  Information entered by :: mj Marsi Turvey, lpn   Activities of Daily Living    05/25/2022    9:35 AM 10/28/2021    3:03 PM  In your present state of health, do you have any difficulty performing the following activities:  Hearing? 0   Vision? 0   Comment Eye exams Dr. Eliseo Squires   Difficulty concentrating or making decisions? 1   Walking or climbing stairs? 1   Dressing or bathing? 0   Doing errands, shopping? 0 0  Preparing Food and eating ? N   Using the Toilet? N   In the past six months, have you accidently leaked urine? Y   Do you have problems with loss of bowel control? N   Managing your Medications? N   Managing your Finances? N   Housekeeping or managing your Housekeeping? N     Patient Care Team: Susy Frizzle, MD as PCP - General (Family Medicine) Alisa Graff, FNP as Nurse Practitioner (Family Medicine) Theora Gianotti, NP as Nurse Practitioner (Cardiology) Edythe Clarity, [facility] as Pharmacist (Pharmacist)  Indicate any recent Medical Services you may have received from other than [facility] providers in the  past year (date may be approximate).      Assessment:   This is a routine wellness examination for Virginia Walters.  Hearing/Vision screen No results found.  Dietary issues and exercise activities discussed: Current Exercise Habits: The patient does not participate in regular exercise at present, Exercise limited by: cardiac condition(s);orthopedic condition(s)   Goals Addressed             This Visit's Progress    DIET - INCREASE WATER INTAKE   On track    Exercise 150 min/wk Moderate Activity         Depression Screen    05/25/2022    9:31 AM 10/18/2021   12:41 PM 05/07/2021   10:27 AM 09/26/2019    9:58 AM 08/31/2017   12:39 PM  PHQ 2/9 Scores  PHQ - 2 Score 0 2 0 6 3  PHQ- 9 Score  '3  21 8    '$ Fall Risk    05/25/2022    9:35 AM 10/18/2021    1:32 PM 10/18/2021   12:49 PM 05/07/2021   10:36 AM 10/08/2018   10:58 AM  Fall Risk   Falls in the past year? 1 1 0 1 0  Number falls in past yr: 1 0  0 0  Injury with Fall? '1 1  1 '$ 0  Risk for fall due to : History of fall(s);Impaired balance/gait;Impaired mobility   History of fall(s);Impaired balance/gait;Impaired mobility;Impaired vision   Follow up Falls prevention discussed   Falls prevention discussed     FALL RISK PREVENTION PERTAINING TO THE HOME:  Any stairs in or around the home? Yes  If so, are there any without handrails? No  Home free of loose throw rugs in walkways, pet beds, electrical cords, etc? Yes  Adequate lighting in your home to reduce risk of falls? Yes   ASSISTIVE DEVICES UTILIZED TO PREVENT FALLS:  Life alert? Yes  Use of a cane, walker or w/c? Yes  Grab bars in the bathroom? Yes  Shower chair or bench in shower? Yes  Elevated toilet seat or a handicapped toilet? Yes   TIMED UP AND GO:  Was the test performed? No .  Phone visit.  Cognitive Function:    10/12/2016   10:05 AM  MMSE - Mini Mental State Exam  Orientation to time 3  Orientation to Place 5  Registration 3  Attention/ Calculation 5  Recall 2  Language- name 2 objects 2   Language- repeat 1  Language- follow 3 step command 3  Language- read & follow direction 1  Write a sentence 1  Copy design 0  Total score 26        05/25/2022    9:37 AM 05/07/2021   10:39 AM  6CIT Screen  What Year? 0 points 0 points  What month? 0 points 0 points  What time? 0 points 0 points  Count back from 20 0 points 0 points  Months in reverse 0 points 4 points  Repeat phrase 2 points 2 points  Total Score 2 points 6 points    Immunizations Immunization History  Administered Date(s) Administered   Fluad Quad(high Dose 65+) 06/06/2019, 07/02/2020   Influenza, High Dose Seasonal PF 05/23/2017, 06/05/2018   Moderna Sars-Covid-2 Vaccination 11/14/2019, 12/11/2019   Pneumococcal Polysaccharide-23 06/06/2019    TDAP status: Due, Education has been provided regarding the importance of this vaccine. Advised may receive this vaccine at local pharmacy or Health Dept. Aware to provide a copy of the vaccination record if  obtained from local pharmacy or Health Dept. Verbalized acceptance and understanding.  Flu Vaccine status: Due, Education has been provided regarding the importance of this vaccine. Advised may receive this vaccine at local pharmacy or Health Dept. Aware to provide a copy of the vaccination record if obtained from local pharmacy or Health Dept. Verbalized acceptance and understanding.  Pneumococcal vaccine status: Declined,  Education has been provided regarding the importance of this vaccine but patient still declined. Advised may receive this vaccine at local pharmacy or Health Dept. Aware to provide a copy of the vaccination record if obtained from local pharmacy or Health Dept. Verbalized acceptance and understanding.   Covid-19 vaccine status: Declined, Education has been provided regarding the importance of this vaccine but patient still declined. Advised may receive this vaccine at local pharmacy or Health Dept.or vaccine clinic. Aware to provide a copy of  the vaccination record if obtained from local pharmacy or Health Dept. Verbalized acceptance and understanding.  Qualifies for Shingles Vaccine? Yes   Zostavax completed No   Shingrix Completed?: No.    Education has been provided regarding the importance of this vaccine. Patient has been advised to call insurance company to determine out of pocket expense if they have not yet received this vaccine. Advised may also receive vaccine at local pharmacy or Health Dept. Verbalized acceptance and understanding.  Screening Tests Health Maintenance  Topic Date Due   INFLUENZA VACCINE  08/14/2022 (Originally 03/15/2022)   Zoster Vaccines- Shingrix (1 of 2) 08/25/2022 (Originally 07/04/1960)   Pneumonia Vaccine 47+ Years old (2 - PCV) 05/26/2023 (Originally 06/05/2020)   TETANUS/TDAP  05/26/2023 (Originally 04/17/2019)   COVID-19 Vaccine (3 - Moderna risk series) 06/15/2023 (Originally 01/08/2020)   COLONOSCOPY (Pts 45-30yr Insurance coverage will need to be confirmed)  04/02/2027   DEXA SCAN  Completed   HPV VACCINES  Aged Out    Health Maintenance  There are no preventive care reminders to display for this patient.   Colorectal cancer screening: No longer required.   Mammogram status: No longer required due to age.  Bone Density status: Ordered Pt declined. Pt provided with contact info and advised to call to schedule appt.  Lung Cancer Screening: (Low Dose CT Chest recommended if Age 81-80years, 30 pack-year currently smoking OR have quit w/in 15years.) does not qualify.   Lung Cancer Screening Referral: N/A  Additional Screening:  Hepatitis C Screening: does not qualify; Completed N/A  Vision Screening: Recommended annual ophthalmology exams for early detection of glaucoma and other disorders of the eye. Is the patient up to date with their annual eye exam?  Yes  Who is the provider or what is the name of the office in which the patient attends annual eye exams? DR. VANN If pt is not  established with a provider, would they like to be referred to a provider to establish care? No .   Dental Screening: Recommended annual dental exams for proper oral hygiene  Community Resource Referral / Chronic Care Management: CRR required this visit?  No   CCM required this visit?  No      Plan:     I have personally reviewed and noted the following in the patient's chart:   Medical and social history Use of alcohol, tobacco or illicit drugs  Current medications and supplements including opioid prescriptions. Patient is currently taking opioid prescriptions. Information provided to patient regarding non-opioid alternatives. Patient advised to discuss non-opioid treatment plan with their provider. Functional ability and status Nutritional status Physical  activity Advanced directives List of other physicians Hospitalizations, surgeries, and ER visits in previous 12 months Vitals Screenings to include cognitive, depression, and falls Referrals and appointments  In addition, I have reviewed and discussed with patient certain preventive protocols, quality metrics, and best practice recommendations. A written personalized care plan for preventive services as well as general preventive health recommendations were provided to patient.     Chriss Driver, LPN   20/91/0681   Nurse Notes: Pt is up to date on age appropriate health maintenance. Discussed vaccines and how to obtain. Pt verbalized understanding of all.

## 2022-06-02 ENCOUNTER — Other Ambulatory Visit: Payer: Self-pay | Admitting: Family Medicine

## 2022-06-03 NOTE — Telephone Encounter (Signed)
Requested medication (s) are due for refill today: Unclear, no date or amt due to historical provider  Requested medication (s) are on the active medication list: yes  Last refill:  10/20/21, no details  Future visit scheduled: no, seen 10/14/21  Notes to clinic:  Historical Provider, please assess.       Requested Prescriptions  Pending Prescriptions Disp Refills   verapamil (CALAN-SR) 240 MG CR tablet [Pharmacy Med Name: VERAPAMIL '240MG'$  SR TABLET 12H] 90 tablet 3    Sig: TAKE 1 TABLET BY MOUTH AT  BEDTIME     Cardiovascular: Calcium Channel Blockers 3 Failed - 06/02/2022 10:51 PM      Failed - Cr in normal range and within 360 days    Creat  Date Value Ref Range Status  10/14/2021 1.37 (H) 0.60 - 0.95 mg/dL Final   Creatinine, Ser  Date Value Ref Range Status  11/17/2021 1.13 (H) 0.44 - 1.00 mg/dL Final   Creatinine, Urine  Date Value Ref Range Status  10/08/2021 81 mg/dL Final    Comment:    Performed at [facility], [address], [place], [place] [address]         Failed - Valid encounter within last 6 months    Recent Outpatient Visits           7 months ago AKI (acute kidney injury) ([place])   [place] Susy Frizzle, MD   8 months ago Calculus of bile duct without cholecystitis with obstruction   [place] Susy Frizzle, MD   8 months ago Chest pain, unspecified type   [place] Eulogio Bear, NP   10 months ago Acute cystitis without hematuria   [place] Susy Frizzle, MD   1 year ago Iron deficiency anemia due to chronic blood loss   [place] Pickard, Cammie Mcgee, MD              Passed - ALT in normal range and within 360 days    ALT  Date Value Ref Range Status  11/17/2021 27 0 - 44 U/L Final         Passed - AST in normal range and within 360 days    AST  Date Value Ref Range Status  11/17/2021 19 15 - 41 U/L Final          Passed - Last BP in normal range    BP Readings from Last 1 Encounters:  11/30/21 117/79         Passed - Last Heart Rate in normal range    Pulse Readings from Last 1 Encounters:  11/30/21 87

## 2022-06-06 NOTE — Telephone Encounter (Signed)
Pls advice if you still want pt on this med?

## 2022-06-27 ENCOUNTER — Telehealth: Payer: Self-pay | Admitting: Pharmacist

## 2022-06-27 NOTE — Progress Notes (Signed)
Chronic Care Management Pharmacy Assistant   Name: Virginia Walters  MRN: [id number] DOB: 04/08/1941   Reason for Encounter: Disease State - Hypertension Call    Recent office visits:  None noted  Recent consult visits:  None noted  Hospital visits:  None in previous 6 months  Medications: Outpatient Encounter Medications as of 06/27/2022  Medication Sig Note   Ascorbic Acid (VITAMIN C PO) Take by mouth.    BIOTIN PO Take 1 tablet by mouth daily.    BREZTRI AEROSPHERE 160-9-4.8 MCG/ACT AERO USE 2 INHALATIONS BY MOUTH INTO  THE LUNGS IN THE MORNING AND AT  BEDTIME    Calcium Carbonate-Vitamin D (CALCIUM + D PO) Take 1 capsule by mouth 2 (two) times daily.    Cholecalciferol (VITAMIN D) 2000 UNITS CAPS Take 2,000 Units by mouth daily.    clotrimazole-betamethasone (LOTRISONE) cream Apply 1 application topically 2 (two) times daily.    Cyanocobalamin (VITAMIN B-12 PO) Take by mouth.    dorzolamide (TRUSOPT) 2 % ophthalmic solution Place 1 drop into the left eye 2 (two) times daily.    doxazosin (CARDURA) 4 MG tablet TAKE ONE-HALF TABLET BY  MOUTH DAILY    feeding supplement (ENSURE SURGERY) LIQD Take 237 mLs by mouth 2 (two) times daily between meals.    fish oil-omega-3 fatty acids 1000 MG capsule Take 1,000 mg by mouth daily. 10/27/2021: On hold   HYDROcodone-acetaminophen (NORCO/VICODIN) 5-325 MG tablet Take 1 tablet by mouth every 6 (six) hours as needed for moderate pain.    ibuprofen (ADVIL) 800 MG tablet Take 1 tablet (800 mg total) by mouth every 8 (eight) hours as needed.    latanoprost (XALATAN) 0.005 % ophthalmic solution Place 1 drop into the left eye at bedtime.     lisinopril (ZESTRIL) 40 MG tablet TAKE 1 TABLET BY MOUTH  DAILY    magnesium 30 MG tablet Take 30 mg by mouth daily.    melatonin 5 MG TABS Take 5 mg by mouth at bedtime.    Multiple Vitamin (MULTIVITAMIN) tablet Take 1 tablet by mouth daily.    potassium chloride (KLOR-CON) 10 MEQ tablet TAKE 1  TABLET BY MOUTH  DAILY    pravastatin (PRAVACHOL) 40 MG tablet TAKE 1 TABLET BY MOUTH  DAILY    traZODone (DESYREL) 50 MG tablet TAKE 1 TABLET BY MOUTH AT  BEDTIME    venlafaxine XR (EFFEXOR-XR) 75 MG 24 hr capsule TAKE 1 CAPSULE BY MOUTH  DAILY WITH BREAKFAST    verapamil (CALAN-SR) 240 MG CR tablet TAKE 1 TABLET BY MOUTH AT  BEDTIME    vitamin E 180 MG (400 UNITS) capsule Take 400 Units by mouth daily.    No facility-administered encounter medications on file as of 06/27/2022.    Current antihypertensive regimen:  lisinopril (ZESTRIL) 40 MG tablet  verapamil (CALAN-SR) 240 MG CR tablet   How often are you checking your Blood Pressure?  Patient reported checking blood pressures  Current home BP readings:     What recent interventions/DTPs have been made by any provider to improve Blood Pressure control since last CPP Visit:  Patient denied any recent changes to medications since last visit with CPP   Any recent hospitalizations or ED visits since last visit with CPP? Patient has not had any hospitalizations or ED visits since last visit with CPP   What diet changes have been made to improve Blood Pressure Control?  Patient reported    What exercise is being done to improve your  Blood Pressure Control?   Patient reported    Adherence Review: Is the patient currently on ACE/ARB medication? Yes Does the patient have >5 day gap between last estimated fill dates? No      Care Gaps   AWV: done 05/25/22 Colonoscopy: done 04/01/20 DM Eye Exam: N/A DM Foot Exam: N/A Microalbumin: N/A HbgAIC: N/A DEXA: N/A Mammogram: done 12/10/15     Star Rating Drugs: Pravastatin (PRAVACHOL) 40 MG tablet - last filled 06/11/22 90 days  Lisinopril (ZESTRIL) 40 MG tablet - last filled 05/23/22  90 days     No future appointments.  Multiple attempts were made to contact patient. Attempts were unsuccessful. / ls,CMA    Jobe Gibbon, [place] Pharmacist Assistant  [phone]

## 2022-07-14 DIAGNOSIS — H401113 Primary open-angle glaucoma, right eye, severe stage: Secondary | ICD-10-CM | POA: Diagnosis not present

## 2022-07-14 DIAGNOSIS — H401122 Primary open-angle glaucoma, left eye, moderate stage: Secondary | ICD-10-CM | POA: Diagnosis not present

## 2022-07-28 ENCOUNTER — Telehealth: Payer: Self-pay | Admitting: Pharmacist

## 2022-07-28 NOTE — Progress Notes (Signed)
Chronic Care Management Pharmacy Assistant   Name: Virginia Walters  MRN: [id number] DOB: [birth year]   Reason for Encounter: Disease State -  Hypertension Call      Recent office visits:  None noted.   Recent consult visits:  None noted.   Hospital visits:  None in previous 6 months  Medications: Outpatient Encounter Medications as of 07/28/2022  Medication Sig Note   Ascorbic Acid (VITAMIN C PO) Take by mouth.    BIOTIN PO Take 1 tablet by mouth daily.    BREZTRI AEROSPHERE 160-9-4.8 MCG/ACT AERO USE 2 INHALATIONS BY MOUTH INTO  THE LUNGS IN THE MORNING AND AT  BEDTIME    Calcium Carbonate-Vitamin D (CALCIUM + D PO) Take 1 capsule by mouth 2 (two) times daily.    Cholecalciferol (VITAMIN D) 2000 UNITS CAPS Take 2,000 Units by mouth daily.    clotrimazole-betamethasone (LOTRISONE) cream Apply 1 application topically 2 (two) times daily.    Cyanocobalamin (VITAMIN B-12 PO) Take by mouth.    dorzolamide (TRUSOPT) 2 % ophthalmic solution Place 1 drop into the left eye 2 (two) times daily.    doxazosin (CARDURA) 4 MG tablet TAKE ONE-HALF TABLET BY  MOUTH DAILY    feeding supplement (ENSURE SURGERY) LIQD Take 237 mLs by mouth 2 (two) times daily between meals.    fish oil-omega-3 fatty acids 1000 MG capsule Take 1,000 mg by mouth daily. 10/27/2021: On hold   HYDROcodone-acetaminophen (NORCO/VICODIN) 5-325 MG tablet Take 1 tablet by mouth every 6 (six) hours as needed for moderate pain.    ibuprofen (ADVIL) 800 MG tablet Take 1 tablet (800 mg total) by mouth every 8 (eight) hours as needed.    latanoprost (XALATAN) 0.005 % ophthalmic solution Place 1 drop into the left eye at bedtime.     lisinopril (ZESTRIL) 40 MG tablet TAKE 1 TABLET BY MOUTH  DAILY    magnesium 30 MG tablet Take 30 mg by mouth daily.    melatonin 5 MG TABS Take 5 mg by mouth at bedtime.    Multiple Vitamin (MULTIVITAMIN) tablet Take 1 tablet by mouth daily.    potassium chloride (KLOR-CON) 10 MEQ tablet TAKE  1 TABLET BY MOUTH  DAILY    pravastatin (PRAVACHOL) 40 MG tablet TAKE 1 TABLET BY MOUTH  DAILY    traZODone (DESYREL) 50 MG tablet TAKE 1 TABLET BY MOUTH AT  BEDTIME    venlafaxine XR (EFFEXOR-XR) 75 MG 24 hr capsule TAKE 1 CAPSULE BY MOUTH  DAILY WITH BREAKFAST    verapamil (CALAN-SR) 240 MG CR tablet TAKE 1 TABLET BY MOUTH AT  BEDTIME    vitamin E 180 MG (400 UNITS) capsule Take 400 Units by mouth daily.    No facility-administered encounter medications on file as of 07/28/2022.    Current antihypertensive regimen:  lisinopril (ZESTRIL) 40 MG tablet  verapamil (CALAN-SR) 240 MG CR tablet   How often are you checking your Blood Pressure?  Patient reported checking blood pressures  Current home BP readings:     What recent interventions/DTPs have been made by any provider to improve Blood Pressure control since last CPP Visit:  Patient denied any recent changes to medications since last visit with CPP    Any recent hospitalizations or ED visits since last visit with CPP? Patient has not had any ED visits or hospitalizations since last visit with CPP    What diet changes have been made to improve Blood Pressure Control?  Patient reported watching her salt intake  daily.    What exercise is being done to improve your Blood Pressure Control?   Patient reported remaining as active as she is able.    Adherence Review: Is the patient currently on ACE/ARB medication? Yes Does the patient have >5 day gap between last estimated fill dates? No       Care Gaps   AWV: done 05/25/22 Colonoscopy: done 04/01/20 DM Eye Exam: N/A DM Foot Exam: N/A Microalbumin: N/A HbgAIC: N/A DEXA: N/A Mammogram: done 12/10/15     Star Rating Drugs: Pravastatin (PRAVACHOL) 40 MG tablet - last filled 06/11/22 90 days  Lisinopril (ZESTRIL) 40 MG tablet - last filled 05/23/22  90 days    No future appointments.   Jobe Gibbon, [facility] Clinical Pharmacist Assistant  [phone]

## 2022-08-22 ENCOUNTER — Telehealth: Payer: Self-pay | Admitting: Pharmacist

## 2022-08-22 NOTE — Progress Notes (Signed)
Care Management & Coordination Services Pharmacy Team  Reason for Encounter: Hypertension  Contacted patient on 08/22/22 and 09/14/22 to discuss hypertension disease state.   Recent office visits:  None noted  Recent consult visits:  None noted  Hospital visits:  None in previous 6 months  Medications: Outpatient Encounter Medications as of 08/22/2022  Medication Sig Note   Ascorbic Acid (VITAMIN C PO) Take by mouth.    BIOTIN PO Take 1 tablet by mouth daily.    BREZTRI AEROSPHERE 160-9-4.8 MCG/ACT AERO USE 2 INHALATIONS BY MOUTH INTO  THE LUNGS IN THE MORNING AND AT  BEDTIME    Calcium Carbonate-Vitamin D (CALCIUM + D PO) Take 1 capsule by mouth 2 (two) times daily.    Cholecalciferol (VITAMIN D) 2000 UNITS CAPS Take 2,000 Units by mouth daily.    clotrimazole-betamethasone (LOTRISONE) cream Apply 1 application topically 2 (two) times daily.    Cyanocobalamin (VITAMIN B-12 PO) Take by mouth.    dorzolamide (TRUSOPT) 2 % ophthalmic solution Place 1 drop into the left eye 2 (two) times daily.    doxazosin (CARDURA) 4 MG tablet TAKE ONE-HALF TABLET BY  MOUTH DAILY    feeding supplement (ENSURE SURGERY) LIQD Take 237 mLs by mouth 2 (two) times daily between meals.    fish oil-omega-3 fatty acids 1000 MG capsule Take 1,000 mg by mouth daily. 10/27/2021: On hold   HYDROcodone-acetaminophen (NORCO/VICODIN) 5-325 MG tablet Take 1 tablet by mouth every 6 (six) hours as needed for moderate pain.    ibuprofen (ADVIL) 800 MG tablet Take 1 tablet (800 mg total) by mouth every 8 (eight) hours as needed.    latanoprost (XALATAN) 0.005 % ophthalmic solution Place 1 drop into the left eye at bedtime.     lisinopril (ZESTRIL) 40 MG tablet TAKE 1 TABLET BY MOUTH  DAILY    magnesium 30 MG tablet Take 30 mg by mouth daily.    melatonin 5 MG TABS Take 5 mg by mouth at bedtime.    Multiple Vitamin (MULTIVITAMIN) tablet Take 1 tablet by mouth daily.    potassium chloride (KLOR-CON) 10 MEQ tablet TAKE 1  TABLET BY MOUTH  DAILY    pravastatin (PRAVACHOL) 40 MG tablet TAKE 1 TABLET BY MOUTH  DAILY    traZODone (DESYREL) 50 MG tablet TAKE 1 TABLET BY MOUTH AT  BEDTIME    venlafaxine XR (EFFEXOR-XR) 75 MG 24 hr capsule TAKE 1 CAPSULE BY MOUTH  DAILY WITH BREAKFAST    verapamil (CALAN-SR) 240 MG CR tablet TAKE 1 TABLET BY MOUTH AT  BEDTIME    vitamin E 180 MG (400 UNITS) capsule Take 400 Units by mouth daily.    No facility-administered encounter medications on file as of 08/22/2022.    Recent Office Vitals: BP Readings from Last 3 Encounters:  11/30/21 117/79  11/17/21 111/61  11/05/21 130/70   Pulse Readings from Last 3 Encounters:  11/30/21 87  11/17/21 61  11/05/21 88    Wt Readings from Last 3 Encounters:  05/25/22 206 lb (93.4 kg)  05/13/22 215 lb (97.5 kg)  11/30/21 215 lb (97.5 kg)     Kidney Function Lab Results  Component Value Date/Time   CREATININE 1.13 (H) 11/17/2021 10:04 AM   CREATININE 1.37 (H) 10/14/2021 02:27 PM   CREATININE 1.48 (H) 10/09/2021 06:43 AM   CREATININE 3.38 (H) 10/05/2021 04:47 PM   GFR 77.72 12/28/2010 09:03 AM   GFRNONAA 49 (L) 11/17/2021 10:04 AM   GFRNONAA 39 (L) 11/13/2020 02:38 PM  GFRAA 45 (L) 11/13/2020 02:38 PM       Latest Ref Rng & Units 11/17/2021   10:04 AM 10/14/2021    2:27 PM 10/09/2021    6:43 AM  BMP  Glucose 70 - 99 mg/dL 111  117  84   BUN 8 - 23 mg/dL 14  22  42   Creatinine 0.44 - 1.00 mg/dL 1.13  1.37  1.48   BUN/Creat Ratio 6 - 22 (calc)  16    Sodium 135 - 145 mmol/L 137  140  137   Potassium 3.5 - 5.1 mmol/L 3.8  4.4  4.5   Chloride 98 - 111 mmol/L 105  109  110   CO2 22 - 32 mmol/L '21  21  19   '$ Calcium 8.9 - 10.3 mg/dL 8.7  9.7  8.3      Current antihypertensive regimen:  lisinopril (ZESTRIL) 40 MG tablet  verapamil (CALAN-SR) 240 MG CR tablet    Patient verbally confirms she is taking the above medications as directed. Yes  How often are you checking your Blood Pressure? weekly  she checks her blood  pressure in the morning before taking her medication.  Current home BP readings: Patient did not have any at the time of the call  DATE:             BP               PULSE    Wrist or arm cuff: Caffeine intake: Salt intake: OTC medications including pseudoephedrine or NSAIDs?  Any readings above 180/120? No If yes any symptoms of hypertensive emergency? patient denies any symptoms of high blood pressure   What recent interventions/DTPs have been made by any provider to improve Blood Pressure control since last CPP Visit:  Patient denied any recent medication changes  Any recent hospitalizations or ED visits since last visit with CPP? No  What diet changes have been made to improve Blood Pressure Control?  Patient reported she controls her salt intake in her diet  What exercise is being done to improve your Blood Pressure Control?  Patient reported she remains as active as she can.  Adherence Review: Is the patient currently on ACE/ARB medication? Yes Does the patient have >5 day gap between last estimated fill dates? No  Star Rating Drugs:  Medication:   Last Fill: Day Supply  lisinopril 40 MG tablet  08/16/22  100  pravastatin 40 MG tablet  09/04/22 [address]

## 2022-09-05 ENCOUNTER — Encounter: Payer: Self-pay | Admitting: Family Medicine

## 2022-09-05 ENCOUNTER — Ambulatory Visit (INDEPENDENT_AMBULATORY_CARE_PROVIDER_SITE_OTHER): Payer: Medicare Other | Admitting: Family Medicine

## 2022-09-05 VITALS — BP 124/72 | HR 80 | Temp 98.1°F | Ht 67.0 in | Wt 224.0 lb

## 2022-09-05 DIAGNOSIS — L309 Dermatitis, unspecified: Secondary | ICD-10-CM

## 2022-09-05 MED ORDER — PREDNISONE 20 MG PO TABS: ORAL_TABLET | ORAL | 0 refills | Status: DC

## 2022-09-05 MED ORDER — TRIAMCINOLONE ACETONIDE 0.1 % EX CREA
1.0000 | TOPICAL_CREAM | Freq: Two times a day (BID) | CUTANEOUS | 0 refills | Status: DC
Start: 2022-09-05 — End: 2022-09-15

## 2022-09-05 NOTE — Progress Notes (Signed)
Subjective:    Patient ID: Virginia Walters, female    DOB: [birth year], 82 y.o.   MRN: [id number]  Patient reports a 3-week history of a widespread pruritic rash.  It is on her arms and legs.  It is a papulosquamous maculopapular rash.  There is scaling, excoriations, erythema.  There is no sharp discernible border.  She has been applying Aspercreme lotions and Vaseline without relief  Past Medical History:  Diagnosis Date   (HFpEF) heart failure with preserved ejection fraction ([place])    a.) TTE 07/22/2018: EF 45-50%; mild LVH; mild diffuse HK; mild LA dilation; PASP 30-35 mmHg; G1DD.   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   Weakness    Past Surgical History:  Procedure Laterality Date   ANKLE ARTHROSCOPY WITH OPEN REDUCTION INTERNAL FIXATION (ORIF) Right    APPENDECTOMY     CHOLECYSTECTOMY     COLONOSCOPY N/A 07/21/2018   Procedure: COLONOSCOPY;  Surgeon: Lin Landsman, MD;  Location: [facility] ENDOSCOPY;  Service: Gastroenterology;  Laterality: N/A;   COLONOSCOPY  WITH PROPOFOL N/A 04/01/2020   Procedure: COLONOSCOPY WITH PROPOFOL;  Surgeon: Virgel Manifold, MD;  Location: [facility] ENDOSCOPY;  Service: Endoscopy;  Laterality: N/A;   ESOPHAGOGASTRODUODENOSCOPY N/A 07/21/2018   Procedure: ESOPHAGOGASTRODUODENOSCOPY (EGD);  Surgeon: Lin Landsman, MD;  Location: [facility] ENDOSCOPY;  Service: Gastroenterology;  Laterality: N/A;   ESOPHAGOGASTRODUODENOSCOPY N/A 02/11/2020   Procedure: ESOPHAGOGASTRODUODENOSCOPY (EGD);  Surgeon: Virgel Manifold, MD;  Location: [facility] ENDOSCOPY;  Service: Endoscopy;  Laterality: N/A;   ESOPHAGOGASTRODUODENOSCOPY (EGD) WITH PROPOFOL N/A 04/01/2020   Procedure: ESOPHAGOGASTRODUODENOSCOPY (EGD) WITH PROPOFOL;  Surgeon: Virgel Manifold, MD;  Location: [facility] ENDOSCOPY;  Service: Endoscopy;  Laterality: N/A;   FRACTURE SURGERY Left    leg fx   IR EXCHANGE BILIARY DRAIN  10/22/2021   IR PERC CHOLECYSTOSTOMY  09/17/2021   TUBAL LIGATION     Current Outpatient Medications on File Prior to Visit  Medication Sig Dispense Refill   Ascorbic Acid (VITAMIN C PO) Take by mouth.     BIOTIN PO Take 1 tablet by mouth daily.     BREZTRI AEROSPHERE 160-9-4.8 MCG/ACT AERO USE 2 INHALATIONS BY MOUTH INTO  THE LUNGS IN THE MORNING AND AT  BEDTIME 32.1 g 3   Calcium Carbonate-Vitamin D (CALCIUM + D PO) Take 1 capsule by mouth 2 (two) times daily.     Cholecalciferol (VITAMIN D) 2000 UNITS CAPS Take 2,000  Units by mouth daily.     clotrimazole-betamethasone (LOTRISONE) cream Apply 1 application topically 2 (two) times daily. 30 g 0   Cyanocobalamin (VITAMIN B-12 PO) Take by mouth.     dorzolamide (TRUSOPT) 2 % ophthalmic solution Place 1 drop into the left eye 2 (two) times daily.     doxazosin (CARDURA) 4 MG tablet TAKE ONE-HALF TABLET BY  MOUTH DAILY 45 tablet 3   feeding supplement (ENSURE SURGERY) LIQD Take 237 mLs by mouth 2 (two) times daily between meals.     fish oil-omega-3 fatty acids 1000 MG capsule Take 1,000 mg by mouth daily.      HYDROcodone-acetaminophen (NORCO/VICODIN) 5-325 MG tablet Take 1 tablet by mouth every 6 (six) hours as needed for moderate pain. 15 tablet 0   ibuprofen (ADVIL) 800 MG tablet Take 1 tablet (800 mg total) by mouth every 8 (eight) hours as needed. 30 tablet 0   latanoprost (XALATAN) 0.005 % ophthalmic solution Place 1 drop into the left eye at bedtime.      lisinopril (ZESTRIL) 40 MG tablet TAKE 1 TABLET BY MOUTH  DAILY 90 tablet 3   magnesium 30 MG tablet Take 30 mg by mouth daily.     melatonin 5 MG TABS Take 5 mg by mouth at bedtime.     Multiple Vitamin (MULTIVITAMIN) tablet Take 1 tablet by mouth daily.     potassium chloride (KLOR-CON) 10 MEQ tablet TAKE 1 TABLET BY MOUTH  DAILY 90 tablet 3   pravastatin (PRAVACHOL) 40 MG tablet TAKE 1 TABLET BY MOUTH  DAILY 90 tablet 3   traZODone (DESYREL) 50 MG tablet TAKE 1 TABLET BY MOUTH AT  BEDTIME 90 tablet 3   venlafaxine XR (EFFEXOR-XR) 75 MG 24 hr capsule TAKE 1 CAPSULE BY MOUTH  DAILY WITH BREAKFAST 90 capsule 3   verapamil (CALAN-SR) 240 MG CR tablet TAKE 1 TABLET BY MOUTH AT  BEDTIME 90 tablet 3   vitamin E 180 MG (400 UNITS) capsule Take 400 Units by mouth daily.     No current facility-administered medications on file prior to visit.   Allergies  Allergen Reactions   Oxycodone Itching   Social History   Socioeconomic History   Marital status: Widowed    Spouse name: Not on file   Number of children: 1   Years of education: HS   Highest education level: Not on file  Occupational History   Occupation: Retired  Tobacco Use   Smoking status: Former    Packs/day: 1.00    Years: 15.00    Total pack years: 15.00    Types: Cigarettes    Quit date: 12/27/1980    Years since quitting: 41.7   Smokeless tobacco: Never  Vaping Use   Vaping Use: Never used  Substance and Sexual Activity   Alcohol use: Not Currently    Comment: occassionally   Drug use: No   Sexual activity: Not Currently  Other Topics Concern   Not on file   Social History Narrative   Lives at home alone. Husband passed away 11-21-2012.   Right-handed.   No daily use of caffeine.   Daughter lives in [place].   Social Determinants of Health   Financial Resource Strain: Low Risk  (05/25/2022)   Overall Financial Resource Strain (CARDIA)    Difficulty of Paying Living Expenses: Not hard at all  Food Insecurity: No Food Insecurity (05/25/2022)   Hunger Vital Sign    Worried About Running Out of Food in the Last Year:  Never true    Ran Out of Food in the Last Year: Never true  Transportation Needs: No Transportation Needs (05/25/2022)   PRAPARE - Hydrologist (Medical): No    Lack of Transportation (Non-Medical): No  Physical Activity: Inactive (05/25/2022)   Exercise Vital Sign    Days of Exercise per Week: 0 days    Minutes of Exercise per Session: 0 min  Stress: No Stress Concern Present (05/25/2022)   [place]    Feeling of Stress : Not at all  Social Connections: [place] (05/25/2022)   Social Connection and Isolation Panel [NHANES]    Frequency of Communication with Friends and Family: More than three times a week    Frequency of Social Gatherings with Friends and Family: More than three times a week    Attends Religious Services: More than 4 times per year    Active Member of [employer] or Organizations: Yes    Attends Archivist Meetings: More than 4 times per year    Marital Status: Married  Human resources officer Violence: Not At Risk (05/25/2022)   Humiliation, Afraid, Rape, and Kick questionnaire    Fear of Current or Ex-Partner: No    Emotionally Abused: No    Physically Abused: No    Sexually Abused: No     Review of Systems  All other systems reviewed and are negative.      Objective:   Physical Exam Constitutional:      General: She is not in acute distress.    Appearance: She is not ill-appearing,  toxic-appearing or diaphoretic.  HENT:     Mouth/Throat:     Mouth: Mucous membranes are moist.  Neck:     Vascular: No carotid bruit.  Cardiovascular:     Rate and Rhythm: Normal rate and regular rhythm.     Heart sounds: Normal heart sounds. No murmur heard. Pulmonary:     Effort: Pulmonary effort is normal. No respiratory distress.     Breath sounds: No stridor. No wheezing, rhonchi or rales.  Chest:     Chest wall: No tenderness.  Musculoskeletal:     Right lower leg: No edema.     Left lower leg: No edema.  Skin:    Findings: Erythema and rash present. Rash is macular, papular and scaling. Rash is not crusting, nodular, purpuric, pustular, urticarial or vesicular.  Neurological:     General: No focal deficit present.     Mental Status: She is alert. Mental status is at baseline.     Sensory: Sensation is intact.     Coordination: Coordination is intact.     Gait: Gait is intact.          Assessment & Plan:  Dermatitis I believe the patient is dealing with an eczema-like eruption.  However it is severe and all over her legs and itching terribly for the last 3 weeks so I am to start her on a prednisone taper pack and then transition to triamcinolone cream twice daily for maintenance therapy.  Once better she can transition to daily moisturizers as preventative care.  Time

## 2022-09-10 ENCOUNTER — Other Ambulatory Visit: Payer: Self-pay | Admitting: Family Medicine

## 2022-09-12 NOTE — Telephone Encounter (Signed)
Rx 10/18/21 #90 [id number]-too soon Requested Prescriptions  Pending Prescriptions Disp Refills   potassium chloride (KLOR-CON) 10 MEQ tablet [Pharmacy Med Name: POTASSIUM  10MEQ CONTROLLED RELEASE TABLET  CR CHLORIDE WM TB] 90 tablet 3    Sig: TAKE 1 TABLET BY MOUTH DAILY     Endocrinology:  Minerals - Potassium Supplementation Failed - 09/10/2022 10:38 PM      Failed - Cr in normal range and within 360 days    Creat  Date Value Ref Range Status  10/14/2021 1.37 (H) 0.60 - 0.95 mg/dL Final   Creatinine, Ser  Date Value Ref Range Status  11/17/2021 1.13 (H) 0.44 - 1.00 mg/dL Final   Creatinine, Urine  Date Value Ref Range Status  10/08/2021 81 mg/dL Final    Comment:    Performed at [facility], [address] [place], [place] [address]         Passed - K in normal range and within 360 days    Potassium  Date Value Ref Range Status  11/17/2021 3.8 3.5 - 5.1 mmol/L Final  07/22/2013 4.2 3.5 - 5.1 mmol/L Final         Passed - Valid encounter within last 12 months    Recent Outpatient Visits           11 months ago AKI (acute kidney injury) ([place])   [place] Susy Frizzle, MD   11 months ago Calculus of bile duct without cholecystitis with obstruction   [place] Susy Frizzle, MD   12 months ago Chest pain, unspecified type   [place] Eulogio Bear, NP   1 year ago Acute cystitis without hematuria   [place] Susy Frizzle, MD   1 year ago Iron deficiency anemia due to chronic blood loss   [place] Pickard, Cammie Mcgee, MD

## 2022-09-15 ENCOUNTER — Encounter: Payer: Self-pay | Admitting: Family Medicine

## 2022-09-15 ENCOUNTER — Ambulatory Visit (INDEPENDENT_AMBULATORY_CARE_PROVIDER_SITE_OTHER): Payer: Medicare Other | Admitting: Family Medicine

## 2022-09-15 VITALS — BP 134/82 | HR 74 | Temp 98.2°F | Ht 67.0 in | Wt 224.0 lb

## 2022-09-15 DIAGNOSIS — L280 Lichen simplex chronicus: Secondary | ICD-10-CM

## 2022-09-15 MED ORDER — GABAPENTIN 100 MG PO CAPS: 100.0000 mg | ORAL_CAPSULE | Freq: Three times a day (TID) | ORAL | 3 refills | Status: DC | PRN

## 2022-09-15 MED ORDER — MOMETASONE FUROATE 0.1 % EX CREA: TOPICAL_CREAM | Freq: Every day | CUTANEOUS | 1 refills | Status: DC

## 2022-09-15 NOTE — Progress Notes (Signed)
Subjective:    Patient ID: Virginia Walters, female    DOB: [birth year], 82 y.o.   MRN: [id number] 09/05/22 Patient reports a 3-week history of a widespread pruritic rash.  It is on her arms and legs.  It is a papulosquamous maculopapular rash.  There is scaling, excoriations, erythema.  There is no sharp discernible border.  She has been applying Aspercreme lotions and Vaseline without relief   At that time, my plan was: I believe the patient is dealing with an eczema-like eruption.  However it is severe and all over her legs and itching terribly for the last 3 weeks so I am to start her on a prednisone taper pack and then transition to triamcinolone cream twice daily for maintenance therapy.  Once better she can transition to daily moisturizers as preventative care.    09/15/22 Patient presents today continuing to complain of itching.  She states that she wakes up at night, clawing at her forearms.  There are numerous bruises and excoriations and skin tears on the dorsal surface of both forearms.  She is also scratching at the anterior surface of both thighs.  There are small erythematous scabs where she has been scratching.  She saw no improvement with the prednisone or the triamcinolone cream.  There is no rash on her chest on her back or on her pelvic area.  Is limited to the skin on her forearms and thighs Past Medical History:  Diagnosis Date   (HFpEF) heart failure with preserved ejection fraction ([place])    a.) TTE 07/22/2018: EF 45-50%; mild LVH; mild diffuse HK; mild LA dilation; PASP 30-35 mmHg; G1DD.   Alcohol abuse    Alcohol dependence with alcohol-induced mood disorder ([place])    Anemia    Aortic atherosclerosis ([place])    Arthritis    Brief psychotic disorder ([place]) 10/28/2016   a.) auditory hallucinations (people arguing in home who were not there); visual hallucinations (seeing dead husband); patient was involuntarily committed/admitted to [facility].   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   Weakness    Past Surgical History:  Procedure Laterality Date   ANKLE ARTHROSCOPY WITH OPEN REDUCTION INTERNAL FIXATION (ORIF) Right    APPENDECTOMY     CHOLECYSTECTOMY     COLONOSCOPY N/A 07/21/2018   Procedure: COLONOSCOPY;  Surgeon: Lin Landsman, MD;  Location: [facility] ENDOSCOPY;  Service: Gastroenterology;  Laterality: N/A;   COLONOSCOPY WITH PROPOFOL N/A 04/01/2020   Procedure: COLONOSCOPY WITH PROPOFOL;  Surgeon: Virgel Manifold, MD;  Location: [facility] ENDOSCOPY;  Service: Endoscopy;  Laterality: N/A;   ESOPHAGOGASTRODUODENOSCOPY N/A 07/21/2018   Procedure: ESOPHAGOGASTRODUODENOSCOPY (EGD);  Surgeon: Lin Landsman, MD;  Location: [facility] ENDOSCOPY;  Service: Gastroenterology;  Laterality: N/A;   ESOPHAGOGASTRODUODENOSCOPY N/A 02/11/2020   Procedure: ESOPHAGOGASTRODUODENOSCOPY (EGD);  Surgeon: Virgel Manifold, MD;  Location: [facility] ENDOSCOPY;  Service: Endoscopy;  Laterality: N/A;   ESOPHAGOGASTRODUODENOSCOPY (EGD) WITH PROPOFOL N/A 04/01/2020   Procedure: ESOPHAGOGASTRODUODENOSCOPY (EGD) WITH PROPOFOL;  Surgeon: Virgel Manifold, MD;  Location: [facility] ENDOSCOPY;  Service: Endoscopy;  Laterality: N/A;   FRACTURE SURGERY Left    leg fx   IR EXCHANGE BILIARY DRAIN  10/22/2021   IR PERC CHOLECYSTOSTOMY  09/17/2021   TUBAL LIGATION     Current Outpatient Medications on File Prior to Visit   Medication Sig Dispense Refill   Ascorbic Acid (VITAMIN C PO) Take by mouth.     BIOTIN PO Take 1 tablet by mouth daily.     BREZTRI AEROSPHERE 160-9-4.8 MCG/ACT AERO USE 2 INHALATIONS BY MOUTH INTO  THE LUNGS IN THE MORNING AND AT  BEDTIME 32.1 g 3   Calcium Carbonate-Vitamin D (CALCIUM + D PO) Take 1 capsule by mouth 2 (two) times daily.     Cholecalciferol (VITAMIN D) 2000 UNITS CAPS Take 2,000 Units by mouth daily.     clotrimazole-betamethasone (LOTRISONE) cream Apply 1 application topically 2 (two) times daily. 30 g 0   Cyanocobalamin (VITAMIN B-12 PO) Take by mouth.     dorzolamide (TRUSOPT) 2 % ophthalmic solution Place 1 drop into the left eye 2 (two) times daily.     doxazosin (CARDURA) 4 MG tablet TAKE ONE-HALF TABLET BY  MOUTH DAILY 45 tablet 3   feeding supplement (ENSURE SURGERY) LIQD Take 237 mLs by mouth 2 (two) times daily between meals.     fish oil-omega-3 fatty acids 1000 MG capsule Take 1,000 mg by mouth daily.     HYDROcodone-acetaminophen (NORCO/VICODIN) 5-325 MG tablet Take 1 tablet by mouth every 6 (six) hours as needed for moderate pain. 15 tablet 0   ibuprofen (ADVIL) 800 MG tablet Take 1 tablet (800 mg total) by mouth every 8 (eight) hours as needed. 30 tablet 0   latanoprost (XALATAN) 0.005 % ophthalmic solution Place 1 drop into the left eye at bedtime.      lisinopril (ZESTRIL) 40 MG tablet TAKE 1 TABLET BY MOUTH  DAILY 90 tablet 3   magnesium 30 MG tablet Take 30 mg by mouth daily.     melatonin 5 MG TABS Take 5 mg by mouth at bedtime.     Multiple Vitamin (MULTIVITAMIN) tablet Take 1 tablet by mouth daily.     potassium chloride (KLOR-CON) 10 MEQ tablet TAKE 1 TABLET BY MOUTH DAILY 90 tablet 3   pravastatin (PRAVACHOL) 40 MG tablet TAKE 1 TABLET BY MOUTH  DAILY 90 tablet 3   predniSONE (DELTASONE) 20 MG tablet 3 tabs poqday 1-2, 2 tabs poqday 3-4, 1 tab poqday 5-6 12 tablet 0   traZODone (DESYREL) 50 MG tablet TAKE 1 TABLET BY MOUTH AT  BEDTIME 90 tablet 3    triamcinolone cream (KENALOG) 0.1 % Apply 1 Application topically 2 (two) times daily. 80 g 0   venlafaxine XR (EFFEXOR-XR) 75 MG 24 hr capsule TAKE 1 CAPSULE BY MOUTH  DAILY WITH BREAKFAST 90 capsule 3   verapamil (CALAN-SR) 240 MG CR tablet TAKE 1 TABLET BY MOUTH AT  BEDTIME 90 tablet 3   vitamin E 180 MG (400 UNITS) capsule Take 400 Units by mouth daily.     No current facility-administered medications on file prior to visit.   Allergies  Allergen Reactions   Oxycodone Itching   Social History   Socioeconomic History   Marital status: Widowed    Spouse name: Not  on file   Number of children: 1   Years of education: HS   Highest education level: Not on file  Occupational History   Occupation: Retired  Tobacco Use   Smoking status: Former    Packs/day: 1.00    Years: 15.00    Total pack years: 15.00    Types: Cigarettes    Quit date: 12/27/1980    Years since quitting: 41.7   Smokeless tobacco: Never  Vaping Use   Vaping Use: Never used  Substance and Sexual Activity   Alcohol use: Not Currently    Comment: occassionally   Drug use: No   Sexual activity: Not Currently  Other Topics Concern   Not on file  Social History Narrative   Lives at home alone. Husband passed away 11/04/12.   Right-handed.   No daily use of caffeine.   Daughter lives in [place].   Social Determinants of Health   Financial Resource Strain: Low Risk  (05/25/2022)   Overall Financial Resource Strain (CARDIA)    Difficulty of Paying Living Expenses: Not hard at all  Food Insecurity: No Food Insecurity (05/25/2022)   Hunger Vital Sign    Worried About Running Out of Food in the Last Year: Never true    Ran Out of Food in the Last Year: Never true  Transportation Needs: No Transportation Needs (05/25/2022)   PRAPARE - Hydrologist (Medical): No    Lack of Transportation (Non-Medical): No  Physical Activity: Inactive (05/25/2022)   Exercise Vital Sign    Days of  Exercise per Week: 0 days    Minutes of Exercise per Session: 0 min  Stress: No Stress Concern Present (05/25/2022)   [place]    Feeling of Stress : Not at all  Social Connections: [place] (05/25/2022)   Social Connection and Isolation Panel [NHANES]    Frequency of Communication with Friends and Family: More than three times a week    Frequency of Social Gatherings with Friends and Family: More than three times a week    Attends Religious Services: More than 4 times per year    Active Member of [employer] or Organizations: Yes    Attends Archivist Meetings: More than 4 times per year    Marital Status: Married  Human resources officer Violence: Not At Risk (05/25/2022)   Humiliation, Afraid, Rape, and Kick questionnaire    Fear of Current or Ex-Partner: No    Emotionally Abused: No    Physically Abused: No    Sexually Abused: No     Review of Systems  All other systems reviewed and are negative.      Objective:   Physical Exam Constitutional:      General: She is not in acute distress.    Appearance: She is not ill-appearing, toxic-appearing or diaphoretic.  HENT:     Mouth/Throat:     Mouth: Mucous membranes are moist.  Neck:     Vascular: No carotid bruit.  Cardiovascular:     Rate and Rhythm: Normal rate and regular rhythm.     Heart sounds: Normal heart sounds. No murmur heard. Pulmonary:     Effort: Pulmonary effort is normal. No respiratory distress.     Breath sounds: No stridor. No wheezing, rhonchi or rales.  Chest:     Chest wall: No tenderness.  Musculoskeletal:     Right lower leg: No edema.     Left lower leg:  No edema.  Skin:    Findings: Erythema and rash present. Rash is macular, papular and scaling. Rash is not crusting, nodular, purpuric, pustular, urticarial or vesicular.  Neurological:     General: No focal deficit present.     Mental Status: She is alert. Mental  status is at baseline.     Sensory: Sensation is intact.     Coordination: Coordination is intact.     Gait: Gait is intact.          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  Will try gabapentin 100 mg 3 times daily as needed for itching and use Elocon cream 1-2 times daily for the rash.  Cautioned the patient about dizziness and sedation on gabapentin.  Uptitrate medication if itching does not improve.  Patient lives alone, do not suspect scabies.  Denies any exposure to pets which could cause flea bites

## 2022-10-29 ENCOUNTER — Other Ambulatory Visit: Payer: Self-pay | Admitting: Family Medicine

## 2022-12-01 ENCOUNTER — Telehealth: Payer: Self-pay | Admitting: Pharmacist

## 2022-12-01 NOTE — Progress Notes (Signed)
Care Management & Coordination Services Pharmacy Team   Reason for Encounter: Hypertension   Contacted patient to discuss hypertension disease state. Unsuccessful outreach. Left voicemail for patient to return call.    Current antihypertensive regimen:  lisinopril (ZESTRIL) 40 MG tablet  verapamil (CALAN-SR) 240 MG CR tablet   Patient verbally confirms she is taking the above medications as directed.   How often are you checking your Blood Pressure?   she checks her blood pressure   taking her medication.  Current home BP readings:   DATE:             BP               PULSE   Wrist or arm cuff: Caffeine intake: Salt intake: OTC medications including pseudoephedrine or NSAIDs?  Any readings above 180/100?  If yes any symptoms of hypertensive emergency?   What recent interventions/DTPs have been made by any provider to improve Blood Pressure control since last CPP Visit:    Any recent hospitalizations or ED visits since last visit with CPP? No  What diet changes have been made to improve Blood Pressure Control?    What exercise is being done to improve your Blood Pressure Control?    Adherence Review: Is the patient currently on ACE/ARB medication? Yes Does the patient have >5 day gap between last estimated fill dates? No  Star Rating Drugs:  Medication:   Last Fill: Day Supply  Pravastatin 40 MG tablet  11/06/22 100 Lisinopril 40 MG tablet  11/19/22  100  Chart Updates: Recent office visits:  09/15/22 Lynnea Ferrier MD - Family Medicine - Neurodermatitis - gabapentin (NEURONTIN) 100 MG capsule and mometasone (ELOCON) 0.1 % cream prescribed. Follow up as scheduled.   09/05/22 Lynnea Ferrier. MD - Family Medicine - Dermatitis - predniSONE (DELTASONE) 20 MG tablet prescribed. Follow up as scheduled.   Recent consult visits:  None noted.   Hospital visits:  None in previous 6 months  Medications: Outpatient Encounter Medications as of 12/01/2022  Medication Sig  Note   Ascorbic Acid (VITAMIN C PO) Take by mouth.    BIOTIN PO Take 1 tablet by mouth daily.    BREZTRI AEROSPHERE 160-9-4.8 MCG/ACT AERO USE 2 INHALATIONS BY MOUTH INTO  THE LUNGS IN THE MORNING AND AT  BEDTIME    Calcium Carbonate-Vitamin D (CALCIUM + D PO) Take 1 capsule by mouth 2 (two) times daily.    Cholecalciferol (VITAMIN D) 2000 UNITS CAPS Take 2,000 Units by mouth daily.    Cyanocobalamin (VITAMIN B-12 PO) Take by mouth.    dorzolamide (TRUSOPT) 2 % ophthalmic solution Place 1 drop into the left eye 2 (two) times daily.    doxazosin (CARDURA) 4 MG tablet TAKE ONE-HALF TABLET BY  MOUTH DAILY    feeding supplement (ENSURE SURGERY) LIQD Take 237 mLs by mouth 2 (two) times daily between meals.    fish oil-omega-3 fatty acids 1000 MG capsule Take 1,000 mg by mouth daily. 10/27/2021: On hold   gabapentin (NEURONTIN) 100 MG capsule Take 1 capsule (100 mg total) by mouth 3 (three) times daily as needed (itching).    HYDROcodone-acetaminophen (NORCO/VICODIN) 5-325 MG tablet Take 1 tablet by mouth every 6 (six) hours as needed for moderate pain.    ibuprofen (ADVIL) 800 MG tablet Take 1 tablet (800 mg total) by mouth every 8 (eight) hours as needed.    latanoprost (XALATAN) 0.005 % ophthalmic solution Place 1 drop into the left eye at bedtime.  lisinopril (ZESTRIL) 40 MG tablet TAKE 1 TABLET BY MOUTH  DAILY    magnesium 30 MG tablet Take 30 mg by mouth daily.    melatonin 5 MG TABS Take 5 mg by mouth at bedtime.    mometasone (ELOCON) 0.1 % cream Apply topically daily.    Multiple Vitamin (MULTIVITAMIN) tablet Take 1 tablet by mouth daily.    potassium chloride (KLOR-CON) 10 MEQ tablet TAKE 1 TABLET BY MOUTH DAILY    pravastatin (PRAVACHOL) 40 MG tablet TAKE 1 TABLET BY MOUTH DAILY    predniSONE (DELTASONE) 20 MG tablet 3 tabs poqday 1-2, 2 tabs poqday 3-4, 1 tab poqday 5-6    traZODone (DESYREL) 50 MG tablet TAKE 1 TABLET BY MOUTH AT  BEDTIME    venlafaxine XR (EFFEXOR-XR) 75 MG 24 hr  capsule TAKE 1 CAPSULE BY MOUTH  DAILY WITH BREAKFAST    verapamil (CALAN-SR) 240 MG CR tablet TAKE 1 TABLET BY MOUTH AT  BEDTIME    vitamin E 180 MG (400 UNITS) capsule Take 400 Units by mouth daily.    No facility-administered encounter medications on file as of 12/01/2022.    Recent Office Vitals: BP Readings from Last 3 Encounters:  09/15/22 134/82  09/05/22 124/72  11/30/21 117/79   Pulse Readings from Last 3 Encounters:  09/15/22 74  09/05/22 80  11/30/21 87    Wt Readings from Last 3 Encounters:  09/15/22 224 lb (101.6 kg)  09/05/22 224 lb (101.6 kg)  05/25/22 206 lb (93.4 kg)     Kidney Function Lab Results  Component Value Date/Time   CREATININE 1.13 (H) 11/17/2021 10:04 AM   CREATININE 1.37 (H) 10/14/2021 02:27 PM   CREATININE 1.48 (H) 10/09/2021 06:43 AM   CREATININE 3.38 (H) 10/05/2021 04:47 PM   GFR 77.72 12/28/2010 09:03 AM   GFRNONAA 49 (L) 11/17/2021 10:04 AM   GFRNONAA 39 (L) 11/13/2020 02:38 PM   GFRAA 45 (L) 11/13/2020 02:38 PM       Latest Ref Rng & Units 11/17/2021   10:04 AM 10/14/2021    2:27 PM 10/09/2021    6:43 AM  BMP  Glucose 70 - 99 mg/dL 161  096  84   BUN 8 - 23 mg/dL 14  22  42   Creatinine 0.44 - 1.00 mg/dL 0.45  4.09  8.11   BUN/Creat Ratio 6 - 22 (calc)  16    Sodium 135 - 145 mmol/L 137  140  137   Potassium 3.5 - 5.1 mmol/L 3.8  4.4  4.5   Chloride 98 - 111 mmol/L 105  109  110   CO2 22 - 32 mmol/L 21  21  19    Calcium 8.9 - 10.3 mg/dL 8.7  9.7  8.3      No future appointments.   [employer], [employer]

## 2022-12-04 ENCOUNTER — Other Ambulatory Visit: Payer: Self-pay | Admitting: Family Medicine

## 2022-12-06 ENCOUNTER — Ambulatory Visit (INDEPENDENT_AMBULATORY_CARE_PROVIDER_SITE_OTHER): Payer: Medicare Other | Admitting: Family Medicine

## 2022-12-06 ENCOUNTER — Encounter: Payer: Self-pay | Admitting: Family Medicine

## 2022-12-06 VITALS — BP 116/72 | HR 79 | Temp 97.5°F | Ht 67.0 in | Wt 219.0 lb

## 2022-12-06 DIAGNOSIS — L280 Lichen simplex chronicus: Secondary | ICD-10-CM

## 2022-12-06 NOTE — Assessment & Plan Note (Addendum)
Diffuse rash present to her bilateral lower arms, bilateral thighs and right shin, and the right side of her back. This is unrelieved by several medications. No rash to face or swelling. Will refer to dermatology for further evaluation and management.

## 2022-12-06 NOTE — Progress Notes (Signed)
Acute Office Visit  Subjective:     Patient ID: Virginia Walters, female    DOB: 04-Feb-1941, 82 y.o.   MRN: [id number]  Chief Complaint  Patient presents with   Follow-up    rash on my arms spreading to legs and back and it itches at night and wakes up scratching     HPI Patient is in today for an itching rash since Christmas that is spreading from her arms to her legs and back. She has been seen twice by my partner and has tried Prednisone taper, Triamcinolone, Mometasone, and Gabapentin without relief.  She tried changing her detergent and bar soap but used the same things prior to that for years. She lives alone and does not have any pets. Her housekeeper has checked for bed bugs. No drainage from these areas. She denies any know past allergies.  Review of Systems  All other systems reviewed and are negative.   Past Medical History:  Diagnosis Date   (HFpEF) heart failure with preserved ejection fraction    a.) TTE 07/22/2018: EF 45-50%; mild LVH; mild diffuse HK; mild LA dilation; PASP 30-35 mmHg; G1DD.   Alcohol abuse    Alcohol dependence with alcohol-induced mood disorder    Anemia    Aortic atherosclerosis    Arthritis    Brief psychotic disorder 10/28/2016   a.) auditory hallucinations (people arguing in home who were not there); visual hallucinations (seeing dead husband); patient was involuntarily committed/admitted to [facility].   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   Weakness    Past Surgical History:  Procedure Laterality Date   ANKLE ARTHROSCOPY WITH OPEN REDUCTION INTERNAL FIXATION (ORIF) Right    APPENDECTOMY     CHOLECYSTECTOMY     COLONOSCOPY N/A 07/21/2018   Procedure: COLONOSCOPY;  Surgeon: Toney Reil, MD;  Location: [facility] ENDOSCOPY;  Service: Gastroenterology;  Laterality: N/A;   COLONOSCOPY WITH PROPOFOL N/A 04/01/2020   Procedure: COLONOSCOPY WITH PROPOFOL;  Surgeon: Pasty Spillers, MD;  Location: [facility] ENDOSCOPY;  Service: Endoscopy;  Laterality: N/A;   ESOPHAGOGASTRODUODENOSCOPY N/A 07/21/2018   Procedure: ESOPHAGOGASTRODUODENOSCOPY (EGD);  Surgeon: Toney Reil, MD;  Location: [facility] ENDOSCOPY;  Service: Gastroenterology;  Laterality: N/A;   ESOPHAGOGASTRODUODENOSCOPY N/A 02/11/2020   Procedure: ESOPHAGOGASTRODUODENOSCOPY (EGD);  Surgeon: Pasty Spillers, MD;  [facility] END[facility]COPY;  Service: Endoscopy;  Laterality: N/A;   ESOPHAGOGASTRODUODENOSCOPY (EGD) WITH PROPOFOL N/A 04/01/2020   Procedure: ESOPHAGOGASTRODUODENOSCOPY (EGD) WITH PROPOFOL;  Surgeon: Pasty Spillers, MD;  Location: [facility] ENDOSCOPY;  Service: Endoscopy;  Laterality: N/A;   FRACTURE SURGERY Left    leg fx   IR EXCHANGE BILIARY DRAIN  10/22/2021   IR PERC CHOLECYSTOSTOMY  09/17/2021   TUBAL LIGATION     Current Outpatient Medications on File Prior to Visit  Medication Sig  Dispense Refill   Ascorbic Acid (VITAMIN C PO) Take by mouth.     BIOTIN PO Take 1 tablet by mouth daily.     BREZTRI AEROSPHERE 160-9-4.8 MCG/ACT AERO USE 2 INHALATIONS BY MOUTH INTO  THE LUNGS IN THE MORNING AND AT  BEDTIME 32.1 g 3   Calcium Carbonate-Vitamin D (CALCIUM + D PO) Take 1 capsule by mouth 2 (two) times daily.     Cholecalciferol (VITAMIN D) 2000 UNITS CAPS Take 2,000 Units by mouth daily.     Cyanocobalamin (VITAMIN B-12 PO) Take by mouth.     dorzolamide (TRUSOPT) 2 % ophthalmic solution Place 1 drop into the  left eye 2 (two) times daily.     doxazosin (CARDURA) 4 MG tablet TAKE ONE-HALF TABLET BY  MOUTH DAILY 45 tablet 3   feeding supplement (ENSURE SURGERY) LIQD Take 237 mLs by mouth 2 (two) times daily between meals.     fish oil-omega-3 fatty acids 1000 MG capsule Take 1,000 mg by mouth daily.     gabapentin (NEURONTIN) 100 MG capsule Take 1 capsule (100 mg total) by mouth 3 (three) times daily as needed (itching). 90 capsule 3   HYDROcodone-acetaminophen (NORCO/VICODIN) 5-325 MG tablet Take 1 tablet by mouth every 6 (six) hours as needed for moderate pain. 15 tablet 0   ibuprofen (ADVIL) 800 MG tablet Take 1 tablet (800 mg total) by mouth every 8 (eight) hours as needed. 30 tablet 0   latanoprost (XALATAN) 0.005 % ophthalmic solution Place 1 drop into the left eye at bedtime.      lisinopril (ZESTRIL) 40 MG tablet TAKE 1 TABLET BY MOUTH  DAILY 90 tablet 3   magnesium 30 MG tablet Take 30 mg by mouth daily.     melatonin 5 MG TABS Take 5 mg by mouth at bedtime.     mometasone (ELOCON) 0.1 % cream Apply topically daily. 45 g 1   Multiple Vitamin (MULTIVITAMIN) tablet Take 1 tablet by mouth daily.     potassium chloride (KLOR-CON) 10 MEQ tablet TAKE 1 TABLET BY MOUTH DAILY 90 tablet 3   pravastatin (PRAVACHOL) 40 MG tablet TAKE 1 TABLET BY MOUTH DAILY 90 tablet 3   predniSONE (DELTASONE) 20 MG tablet 3 tabs poqday 1-2, 2 tabs poqday 3-4, 1 tab poqday 5-6 12 tablet 0   traZODone (DESYREL) 50 MG tablet TAKE 1 TABLET BY MOUTH AT  BEDTIME 90 tablet 3   venlafaxine XR (EFFEXOR-XR) 75 MG 24 hr capsule TAKE 1 CAPSULE BY MOUTH  DAILY WITH BREAKFAST 90 capsule 3   verapamil (CALAN-SR) 240 MG CR tablet TAKE 1 TABLET BY MOUTH AT  BEDTIME 90 tablet 3   vitamin E 180 MG (400 UNITS) capsule Take 400 Units by mouth daily.     No current facility-administered medications on file prior to visit.   Allergies  Allergen Reactions   Oxycodone Itching       Objective:    BP 116/72   Pulse 79   Temp (!) 97.5  F (36.4 C) (Oral)   Ht 5\' 7"  (1.702 m)   Wt 219 lb (99.3 kg)   SpO2 97%   BMI 34.30 kg/m    Physical Exam Vitals and nursing note reviewed.  Constitutional:      Appearance: Normal appearance. She is normal weight.  HENT:     Head: Normocephalic and atraumatic.  Cardiovascular:     Rate and Rhythm: Normal rate and regular rhythm.     Pulses: Normal pulses.  Heart sounds: Normal heart sounds.  Pulmonary:     Effort: Pulmonary effort is normal.     Breath sounds: Normal breath sounds.  Skin:    General: Skin is warm and dry.     Findings: Rash present. Rash is macular, papular and scaling.  Neurological:     General: No focal deficit present.     Mental Status: She is alert and oriented to person, place, and time. Mental status is at baseline.  Psychiatric:        Mood and Affect: Mood normal.        Behavior: Behavior normal.        Thought Content: Thought content normal.        Judgment: Judgment normal.     No results found for any visits on 12/06/22.      Assessment & Plan:   Problem List Items Addressed This Visit     Neurodermatitis - Primary    Diffuse rash present to her bilateral lower arms, bilateral thighs and right shin, and the right side of her back. This is unrelieved by several medications. No rash to face or swelling. Will refer to dermatology for further evaluation and management.      Relevant Orders   Ambulatory referral to Dermatology    No orders of the defined types were placed in this encounter.   Return if symptoms worsen or fail to improve.  Park Meo, FNP

## 2022-12-19 ENCOUNTER — Encounter: Payer: Self-pay | Admitting: Family Medicine

## 2023-01-23 ENCOUNTER — Other Ambulatory Visit: Payer: Self-pay | Admitting: Family Medicine

## 2023-01-23 DIAGNOSIS — I10 Essential (primary) hypertension: Secondary | ICD-10-CM

## 2023-01-24 NOTE — Telephone Encounter (Signed)
Requested medication (s) are due for refill today: yes   Requested medication (s) are on the active medication list: yes   Last refill:  cardura- 04/29/22 #45 3 refills, lisinopril- 05/16/22 #90 3 refills  Future visit scheduled: no   Notes to clinic:  protocol failed. Last labs 12/06/22. Do you want to refill Rxs?     Requested Prescriptions  Pending Prescriptions Disp Refills   doxazosin (CARDURA) 4 MG tablet [Pharmacy Med Name: Doxazosin Mesylate 4 MG Oral Tablet] 50 tablet 2    Sig: TAKE ONE-HALF TABLET BY MOUTH  DAILY     Cardiovascular:  Alpha Blockers Failed - 01/24/2023  5:23 PM      Failed - Valid encounter within last 6 months    Recent Outpatient Visits           1 year ago AKI (acute kidney injury) (HCC)   [facility] Donita Brooks, MD   1 year ago Calculus of bile duct without cholecystitis with obstruction   [facility] Donita Brooks, MD   1 year ago Chest pain, unspecified type   [facility] Valentino Nose, NP   1 year ago Acute cystitis without hematuria   [facility] Donita Brooks, MD   2 years ago Iron deficiency anemia due to chronic blood loss   [facility] Pickard, Priscille Heidelberg, MD       Future Appointments             In 1 month Terri Piedra, DO [facility]            Passed - Last BP in normal range    BP Readings from Last 1 Encounters:  12/06/22 116/72          lisinopril (ZESTRIL) 40 MG tablet [Pharmacy Med Name: Lisinopril 40 MG Oral Tablet] 100 tablet 2    Sig: TAKE 1 TABLET BY MOUTH DAILY     Cardiovascular:  ACE Inhibitors Failed - 01/24/2023  5:23 PM      Failed - Cr in normal range and within 180 days    Creat  Date Value Ref Range Status  10/14/2021 1.37 (H) 0.60 - 0.95 mg/dL Final   Creatinine, Ser  Date Value Ref Range Status  11/17/2021 1.13 (H) 0.44 - 1.00 mg/dL Final   Creatinine, Urine  Date Value Ref  Range Status  10/08/2021 81 mg/dL Final    Comment:    Performed at [facility], [address] [place], [place] [address]         Failed - K in normal range and within 180 days    Potassium  Date Value Ref Range Status  11/17/2021 3.8 3.5 - 5.1 mmol/L Final  07/22/2013 4.2 3.5 - 5.1 mmol/L Final         Failed - Valid encounter within last 6 months    Recent Outpatient Visits           1 year ago AKI (acute kidney injury) (HCC)   [facility] Donita Brooks, MD   1 year ago Calculus of bile duct without cholecystitis with obstruction   [facility] Donita Brooks, MD   1 year ago Chest pain, unspecified type   [facility] Valentino Nose, NP   1 year ago Acute cystitis without hematuria   [facility] Tanya Nones, Priscille Heidelberg, MD   2 years  ago Iron deficiency anemia due to chronic blood loss   [facility] Pickard, Priscille Heidelberg, MD       Future Appointments             In 1 month Terri Piedra, DO [facility]            Passed - Patient is not pregnant      Passed - Last BP in normal range    BP Readings from Last 1 Encounters:  12/06/22 116/72

## 2023-02-15 ENCOUNTER — Other Ambulatory Visit: Payer: Self-pay | Admitting: Family Medicine

## 2023-02-17 NOTE — Telephone Encounter (Signed)
Unable to refill per protocol, Rx request is too soon for refill. Last refill 06/06/22 for 90 and 3 refills.  Requested Prescriptions  Pending Prescriptions Disp Refills   verapamil (CALAN-SR) 240 MG CR tablet [Pharmacy Med Name: VERAPAMIL 240MG  SR TABLET 12H] 100 tablet 2    Sig: TAKE 1 TABLET BY MOUTH AT  BEDTIME     Cardiovascular: Calcium Channel Blockers 3 Failed - 02/15/2023 10:17 PM      Failed - ALT in normal range and within 360 days    ALT  Date Value Ref Range Status  11/17/2021 27 0 - 44 U/L Final         Failed - AST in normal range and within 360 days    AST  Date Value Ref Range Status  11/17/2021 19 15 - 41 U/L Final         Failed - Cr in normal range and within 360 days    Creat  Date Value Ref Range Status  10/14/2021 1.37 (H) 0.60 - 0.95 mg/dL Final   Creatinine, Ser  Date Value Ref Range Status  11/17/2021 1.13 (H) 0.44 - 1.00 mg/dL Final   Creatinine, Urine  Date Value Ref Range Status  10/08/2021 81 mg/dL Final    Comment:    Performed at [facility], [address], [place], [place] [address]         Failed - Valid encounter within last 6 months    Recent Outpatient Visits           1 year ago AKI (acute kidney injury) (HCC)   [facility] Donita Brooks, MD   1 year ago Calculus of bile duct without cholecystitis with obstruction   [facility] Donita Brooks, MD   1 year ago Chest pain, unspecified type   [facility] Valentino Nose, NP   1 year ago Acute cystitis without hematuria   [facility] Donita Brooks, MD   2 years ago Iron deficiency anemia due to chronic blood loss   [facility] Pickard, Priscille Heidelberg, MD       Future Appointments             In 6 days Terri Piedra, DO [facility]            Passed - Last BP in normal range    BP Readings from Last 1 Encounters:  12/06/22 116/72         Passed -  Last Heart Rate in normal range    Pulse Readings from Last 1 Encounters:  12/06/22 79

## 2023-02-23 ENCOUNTER — Ambulatory Visit (INDEPENDENT_AMBULATORY_CARE_PROVIDER_SITE_OTHER): Payer: Medicare Other | Admitting: Dermatology

## 2023-02-23 DIAGNOSIS — L281 Prurigo nodularis: Secondary | ICD-10-CM

## 2023-02-23 DIAGNOSIS — L853 Xerosis cutis: Secondary | ICD-10-CM

## 2023-02-23 MED ORDER — PIMECROLIMUS 1 % EX CREA: TOPICAL_CREAM | Freq: Two times a day (BID) | CUTANEOUS | 2 refills | Status: DC

## 2023-02-23 MED ORDER — CALCIPOTRIENE 0.005 % EX CREA: TOPICAL_CREAM | Freq: Two times a day (BID) | CUTANEOUS | 0 refills | Status: DC

## 2023-02-23 NOTE — Progress Notes (Signed)
   New Patient Visit   Subjective  Virginia Walters is a 82 y.o. 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 It started in January on her left arm and has spread to right arm and now seems to be starting on her legs. She was given a prednisone taper, TMC 0.1% cream in January. On her follow up in February, she was given Mometasone cream and Gabapentin 3 times per week. It is very itchy, especially at night.  Patient is accompanied by her cousin.  The following portions of the chart were reviewed this encounter and updated as appropriate: medications, allergies, medical history  Review of Systems:  No other skin or systemic complaints except as noted in HPI or Assessment and Plan.  Objective  Well appearing patient in no apparent distress; mood and affect are within normal limits.   A focused examination was performed of the following areas:     Skin: Chronic itchiness noted, initially on the left arm extending to the elbow, now also affecting the right arm and legs. 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 Recommend covering with a thin layer of vaseline.  Note: Pharmacist called to say Pimecrolimus is not covered until she tries and fails Hydrocortisone, Betamethasone, Desonide or Fluocinonide. We changed the prescription to Calcipotriene cream.  May consider Dupixent injections on follow up if not improving.  2. Xerosis (Dry Skin)    - Assessment: Patient's skin appears dry, potentially contributing to itchiness.    - Plan: Encourage the patient to moisturize regularly with Vaseline or another suitable moisturizer, especially after showers. Recommend limiting the number of showers per day to avoid further drying out  the skin.    Return in about 6 weeks (around 04/06/2023) for Follow up.  I, Joanie Coddington, CMA, am acting as scribe for [employer], DO .   Documentation: I have reviewed the above documentation for accuracy and completeness, and I agree with the above.  Langston Reusing, DO

## 2023-02-23 NOTE — Patient Instructions (Addendum)
Thank you for visiting my office today. I appreciate your commitment to addressing your skin concerns and am glad we could discuss potential solutions together.  Here are the key instructions and recommendations from our consultation:  - Medications Prescribed:   - Pimecrolimus Cream: Apply twice daily every day. This medication works differently from steroids and does not require a break in usage.   - CeraVe Anti-Itch Cream with Pramoxine: To be mixed with Pimecrolimus for application. This combination should help soothe the itchiness.   - Vaseline: Apply over the creams to lock in moisture and enhance the treatment's effectiveness. Use sparingly to avoid discomfort, especially in hot or humid conditions.  - Lifestyle Adjustments:   - Continue to manage symptoms with frequent showers as needed, but aim to reduce to a less frequent routine if possible to avoid skin dryness.  - Follow-Up Appointment:   - Please return for a follow-up in six weeks to assess the effectiveness of the treatment. If the current regimen does not alleviate your symptoms, we will discuss the possibility of progressing to Dupixent injections.  - Insurance and Medication Coverage:   - If insurance does not cover Pimecrolimus, we may switch to Tacrolimus, which is similar but in ointment form. This may be slightly challenging to mix with the cream but is still a viable option.  Thank you once again for your visit today. Please feel free to reach out if you have any questions or concerns regarding your treatment plan.    Due to recent changes in healthcare laws, you may see results of your pathology and/or laboratory studies on MyChart before the doctors have had a chance to review them. We understand that in some cases there may be results that are confusing or concerning to you. Please understand that not all results are received at the same time and often the doctors may need to interpret multiple results in  order to provide you with the best plan of care or course of treatment. Therefore, we ask that you please give [place] 2 business days to thoroughly review all your results before contacting the office for clarification. Should we see a critical lab result, you will be contacted sooner.   If You Need Anything After Your Visit  If you have any questions or concerns for your doctor, please call our main line at [phone] If no one answers, please leave a voicemail as directed and we will return your call as soon as possible. Messages left after 4 pm will be answered the following business day.   You may also send [place] a message via MyChart. We typically respond to MyChart messages within 1-2 business days.  For prescription refills, please ask your pharmacy to contact our office. Our fax number is [phone].  If you have an urgent issue when the clinic is closed that cannot wait until the next business day, you can page your doctor at the number below.    Please note that while we do our best to be available for urgent issues outside of office hours, we are not available 24/7.   If you have an urgent issue and are unable to reach [place], you may choose to seek medical care at your doctor's office, retail clinic, urgent care center, or emergency room.  If you have a medical emergency, please immediately call 911 or go to the emergency department. In the event of inclement weather, please call our main line at [phone] for an update on  the status of any delays or closures.  Dermatology Medication Tips: Please keep the boxes that topical medications come in in order to help keep track of the instructions about where and how to use these. Pharmacies typically print the medication instructions only on the boxes and not directly on the medication tubes.   If your medication is too expensive, please contact our office at [phone] or send [place] a message through MyChart.   We are unable to tell what  your co-pay for medications will be in advance as this is different depending on your insurance coverage. However, we may be able to find a substitute medication at lower cost or fill out paperwork to get insurance to cover a needed medication.   If a prior authorization is required to get your medication covered by your insurance company, please allow [place] 1-2 business days to complete this process.  Drug prices often vary depending on where the prescription is filled and some pharmacies may offer cheaper prices.  The website www.goodrx.com contains coupons for medications through different pharmacies. The prices here do not account for what the cost may be with help from insurance (it may be cheaper with your insurance), but the website can give you the price if you did not use any insurance.  - You can print the associated coupon and take it with your prescription to the pharmacy.  - You may also stop by our office during regular business hours and pick up a GoodRx coupon card.  - If you need your prescription sent electronically to a different pharmacy, notify our office through [facility] or by phone at [phone]

## 2023-03-01 ENCOUNTER — Encounter: Payer: Self-pay | Admitting: Dermatology

## 2023-04-06 ENCOUNTER — Ambulatory Visit: Payer: Medicare Other | Admitting: Dermatology

## 2023-04-20 DIAGNOSIS — H35362 Drusen (degenerative) of macula, left eye: Secondary | ICD-10-CM | POA: Diagnosis not present

## 2023-04-20 DIAGNOSIS — H25811 Combined forms of age-related cataract, right eye: Secondary | ICD-10-CM | POA: Diagnosis not present

## 2023-04-20 DIAGNOSIS — H401122 Primary open-angle glaucoma, left eye, moderate stage: Secondary | ICD-10-CM | POA: Diagnosis not present

## 2023-04-20 DIAGNOSIS — H401113 Primary open-angle glaucoma, right eye, severe stage: Secondary | ICD-10-CM | POA: Diagnosis not present

## 2023-04-20 DIAGNOSIS — H524 Presbyopia: Secondary | ICD-10-CM | POA: Diagnosis not present

## 2023-04-22 ENCOUNTER — Other Ambulatory Visit: Payer: Self-pay | Admitting: Family Medicine

## 2023-04-24 NOTE — Telephone Encounter (Signed)
Requested Prescriptions  Refused Prescriptions Disp Refills   verapamil (CALAN-SR) 240 MG CR tablet [Pharmacy Med Name: VERAPAMIL 240MG  SR TABLET 12H] 70 tablet 4    Sig: TAKE 1 TABLET BY MOUTH AT  BEDTIME     Cardiovascular: Calcium Channel Blockers 3 Failed - 04/22/2023 10:12 PM      Failed - ALT in normal range and within 360 days    ALT  Date Value Ref Range Status  11/17/2021 27 0 - 44 U/L Final         Failed - AST in normal range and within 360 days    AST  Date Value Ref Range Status  11/17/2021 19 15 - 41 U/L Final         Failed - Cr in normal range and within 360 days    Creat  Date Value Ref Range Status  10/14/2021 1.37 (H) 0.60 - 0.95 mg/dL Final   Creatinine, Ser  Date Value Ref Range Status  11/17/2021 1.13 (H) 0.44 - 1.00 mg/dL Final   Creatinine, Urine  Date Value Ref Range Status  10/08/2021 81 mg/dL Final    Comment:    Performed at [facility], [address] [place], [place] [address]         Failed - Valid encounter within last 6 months    Recent Outpatient Visits           1 year ago AKI (acute kidney injury) (HCC)   [facility] Donita Brooks, MD   1 year ago Calculus of bile duct without cholecystitis with obstruction   [facility] Donita Brooks, MD   1 year ago Chest pain, unspecified type   [facility] Valentino Nose, NP   1 year ago Acute cystitis without hematuria   [facility] Donita Brooks, MD   2 years ago Iron deficiency anemia due to chronic blood loss   [facility] Pickard, Priscille Heidelberg, MD              Passed - Last BP in normal range    BP Readings from Last 1 Encounters:  12/06/22 116/72         Passed - Last Heart Rate in normal range    Pulse Readings from Last 1 Encounters:  12/06/22 79

## 2023-05-27 ENCOUNTER — Other Ambulatory Visit: Payer: Self-pay | Admitting: Family Medicine

## 2023-05-29 NOTE — Telephone Encounter (Signed)
Requested medication (s) are due for refill today: yes  Requested medication (s) are on the active medication list: yes  Last refill:  09/12/22  Future visit scheduled: no  Notes to clinic:  Unable to refill per protocol due to failed labs, no updated results.      Requested Prescriptions  Pending Prescriptions Disp Refills   potassium chloride (KLOR-CON) 10 MEQ tablet [Pharmacy Med Name: POTASSIUM  CONTROLLED RELEASE TABLET  CR CHLORIDE WM TB] 100 tablet 2    Sig: TAKE 1 TABLET BY MOUTH DAILY     Endocrinology:  Minerals - Potassium Supplementation Failed - 05/27/2023 10:36 PM      Failed - K in normal range and within 360 days    Potassium  Date Value Ref Range Status  11/17/2021 3.8 3.5 - 5.1 mmol/L Final  07/22/2013 4.2 3.5 - 5.1 mmol/L Final         Failed - Cr in normal range and within 360 days    Creat  Date Value Ref Range Status  10/14/2021 1.37 (H) 0.60 - 0.95 mg/dL Final   Creatinine, Ser  Date Value Ref Range Status  11/17/2021 1.13 (H) 0.44 - 1.00 mg/dL Final   Creatinine, Urine  Date Value Ref Range Status  10/08/2021 81 mg/dL Final    Comment:    Performed at [facility], [address], [place], [place] [address]         Failed - Valid encounter within last 12 months    Recent Outpatient Visits           1 year ago AKI (acute kidney injury) (HCC)   [facility] Donita Brooks, MD   1 year ago Calculus of bile duct without cholecystitis with obstruction   [facility] Donita Brooks, MD   1 year ago Chest pain, unspecified type   [facility] Valentino Nose, NP   1 year ago Acute cystitis without hematuria   [facility] Donita Brooks, MD   2 years ago Iron deficiency anemia due to chronic blood loss   [facility] Pickard, Priscille Heidelberg, MD

## 2023-07-15 ENCOUNTER — Other Ambulatory Visit: Payer: Self-pay | Admitting: Family Medicine

## 2023-07-18 NOTE — Telephone Encounter (Signed)
Requested medication (s) are due for refill today: no  Requested medication (s) are on the active medication list: yes  Last refill:  10/31/22 #90/3  Future visit scheduled: no  Notes to clinic:  Unable to refill per protocol due to failed labs, no updated results.      Requested Prescriptions  Pending Prescriptions Disp Refills   pravastatin (PRAVACHOL) 40 MG tablet [Pharmacy Med Name: Pravastatin Sodium 40 MG Oral Tablet] 100 tablet 2    Sig: TAKE 1 TABLET BY MOUTH DAILY     Cardiovascular:  Antilipid - Statins Failed - 07/15/2023 10:42 PM      Failed - Valid encounter within last 12 months    Recent Outpatient Visits           1 year ago AKI (acute kidney injury) (HCC)   [facility] Donita Brooks, MD   1 year ago Calculus of bile duct without cholecystitis with obstruction   [facility] Donita Brooks, MD   1 year ago Chest pain, unspecified type   [facility] Valentino Nose, NP   1 year ago Acute cystitis without hematuria   [facility] Donita Brooks, MD   2 years ago Iron deficiency anemia due to chronic blood loss   [facility] Pickard, Priscille Heidelberg, MD              Failed - Lipid Panel in normal range within the last 12 months    Cholesterol  Date Value Ref Range Status  03/06/2017 158 <200 mg/dL Final   LDL Cholesterol  Date Value Ref Range Status  03/06/2017 64 <100 mg/dL Final   HDL  Date Value Ref Range Status  03/06/2017 72 >50 mg/dL Final   Triglycerides  Date Value Ref Range Status  03/06/2017 111 <150 mg/dL Final         Passed - Patient is not pregnant

## 2023-07-24 ENCOUNTER — Other Ambulatory Visit: Payer: Self-pay

## 2023-07-24 NOTE — Telephone Encounter (Signed)
Prescription Request  07/24/2023  LOV: 09/15/22  What is the name of the medication or equipment? potassium chloride (KLOR-CON) 10 MEQ tablet [[id number]]   Have you contacted your pharmacy to request a refill? Yes   Which pharmacy would you like this sent to?  [facility] - [place], [place] - [address] [place] [address] Phone: [phone] Fax: [phone]    Patient notified that their request is being sent to the clinical staff for review and that they should receive a response within 2 business days.   Please advise at [facility] [phone]

## 2023-07-25 NOTE — Telephone Encounter (Signed)
Requested medications are due for refill today.  Yes - a little too soon  Requested medications are on the active medications list.  yes  Last refill. 09/12/2022 #90 3 rf  Future visit scheduled.   no  Notes to clinic.  Labs are expired. Pt is overdue for a CPE.    Requested Prescriptions  Pending Prescriptions Disp Refills   potassium chloride (KLOR-CON) 10 MEQ tablet 90 tablet 3    Sig: Take 1 tablet (10 mEq total) by mouth daily.     Endocrinology:  Minerals - Potassium Supplementation Failed - 07/24/2023 11:26 AM      Failed - K in normal range and within 360 days    Potassium  Date Value Ref Range Status  11/17/2021 3.8 3.5 - 5.1 mmol/L Final  07/22/2013 4.2 3.5 - 5.1 mmol/L Final         Failed - Cr in normal range and within 360 days    Creat  Date Value Ref Range Status  10/14/2021 1.37 (H) 0.60 - 0.95 mg/dL Final   Creatinine, Ser  Date Value Ref Range Status  11/17/2021 1.13 (H) 0.44 - 1.00 mg/dL Final   Creatinine, Urine  Date Value Ref Range Status  10/08/2021 81 mg/dL Final    Comment:    Performed at [facility], [address] [place], [place] [address]         Failed - Valid encounter within last 12 months    Recent Outpatient Visits           1 year ago AKI (acute kidney injury) (HCC)   [facility] Donita Brooks, MD   1 year ago Calculus of bile duct without cholecystitis with obstruction   [facility] Donita Brooks, MD   1 year ago Chest pain, unspecified type   [facility] Valentino Nose, NP   1 year ago Acute cystitis without hematuria   [facility] Donita Brooks, MD   2 years ago Iron deficiency anemia due to chronic blood loss   [facility] Pickard, Priscille Heidelberg, MD

## 2023-08-10 ENCOUNTER — Other Ambulatory Visit: Payer: Self-pay | Admitting: Family Medicine

## 2023-08-10 NOTE — Telephone Encounter (Signed)
Prescription Request  08/10/2023  LOV: 09/15/2022  What is the name of the medication or equipment?   potassium chloride (KLOR-CON) 10 MEQ tablet  **90 day refill requested**  Have you contacted your pharmacy to request a refill? Yes   Which pharmacy would you like this sent to?  [facility]) [place], [place] - [address] [facility] [address] [place] [address] [place] [address] Phone: [phone] Fax: [phone]    Patient notified that their request is being sent to the clinical staff for review and that they should receive a response within 2 business days.   Please advise pharmacist.

## 2023-08-15 NOTE — Telephone Encounter (Signed)
 Requested medications are due for refill today.  yes  Requested medications are on the active medications list.  yes  Last refill. 09/12/2022 #90 3 rf  Future visit scheduled.   no  Notes to clinic.  Pt last seen 08/2022. Pt is overdue for a cpe. Please advise.    Requested Prescriptions  Pending Prescriptions Disp Refills   potassium chloride  (KLOR-CON ) 10 MEQ tablet 90 tablet 3    Sig: Take 1 tablet (10 mEq total) by mouth daily.     Endocrinology:  Minerals - Potassium Supplementation Failed - 08/15/2023  7:53 AM      Failed - K in normal range and within 360 days    Potassium  Date Value Ref Range Status  11/17/2021 3.8 3.5 - 5.1 mmol/L Final  07/22/2013 4.2 3.5 - 5.1 mmol/L Final         Failed - Cr in normal range and within 360 days    Creat  Date Value Ref Range Status  10/14/2021 1.37 (H) 0.60 - 0.95 mg/dL Final   Creatinine, Ser  Date Value Ref Range Status  11/17/2021 1.13 (H) 0.44 - 1.00 mg/dL Final   Creatinine, Urine  Date Value Ref Range Status  10/08/2021 81 mg/dL Final    Comment:    Performed at [facility], [address], [place], [place] [address]         Failed - Valid encounter within last 12 months    Recent Outpatient Visits           1 year ago AKI (acute kidney injury) (HCC)   [facility] Duanne Butler DASEN, MD   1 year ago Calculus of bile duct without cholecystitis with obstruction   [facility] Duanne Butler DASEN, MD   1 year ago Chest pain, unspecified type   [facility] Chandra Harlene LABOR, NP   2 years ago Acute cystitis without hematuria   [facility] Duanne Butler DASEN, MD   2 years ago Iron  deficiency anemia due to chronic blood loss   [facility] Pickard, Butler DASEN, MD

## 2023-09-14 ENCOUNTER — Other Ambulatory Visit: Payer: Self-pay | Admitting: Family Medicine

## 2023-09-14 NOTE — Telephone Encounter (Signed)
Prescription Request  09/14/2023  LOV: 09/15/2022  What is the name of the medication or equipment?   pravastatin (PRAVACHOL) 40 MG tablet  **90 day refill requested**  Have you contacted your pharmacy to request a refill? Yes   Which pharmacy would you like this sent to?  [facility]) [place], [place] - [address] [facility] [address] [place] [address] [place] [address] Phone: [phone] Fax: [phone]    Patient notified that their request is being sent to the clinical staff for review and that they should receive a response within 2 business days.   Please advise pharmacist.

## 2023-09-15 NOTE — Telephone Encounter (Signed)
Requested medications are due for refill today.  A little too soon  Requested medications are on the active medications list.  yes  Last refill. 10/31/2022 #90 3 rf  Future visit scheduled.   no  Notes to clinic.  Pt is due for CPE.    Requested Prescriptions  Pending Prescriptions Disp Refills   pravastatin (PRAVACHOL) 40 MG tablet 90 tablet 3    Sig: Take 1 tablet (40 mg total) by mouth daily.     Cardiovascular:  Antilipid - Statins Failed - 09/15/2023  9:53 AM      Failed - Valid encounter within last 12 months    Recent Outpatient Visits           1 year ago AKI (acute kidney injury) (HCC)   [facility] Donita Brooks, MD   1 year ago Calculus of bile duct without cholecystitis with obstruction   [facility] Donita Brooks, MD   1 year ago Chest pain, unspecified type   [facility] Valentino Nose, NP   2 years ago Acute cystitis without hematuria   [facility] Donita Brooks, MD   2 years ago Iron deficiency anemia due to chronic blood loss   [facility] Pickard, Priscille Heidelberg, MD              Failed - Lipid Panel in normal range within the last 12 months    Cholesterol  Date Value Ref Range Status  03/06/2017 158 <200 mg/dL Final   LDL Cholesterol  Date Value Ref Range Status  03/06/2017 64 <100 mg/dL Final   HDL  Date Value Ref Range Status  03/06/2017 72 >50 mg/dL Final   Triglycerides  Date Value Ref Range Status  03/06/2017 111 <150 mg/dL Final         Passed - Patient is not pregnant

## 2023-09-29 ENCOUNTER — Other Ambulatory Visit: Payer: Self-pay

## 2023-09-29 ENCOUNTER — Telehealth: Payer: Self-pay | Admitting: Family Medicine

## 2023-09-29 MED ORDER — PRAVASTATIN SODIUM 40 MG PO TABS: 40.0000 mg | ORAL_TABLET | Freq: Every day | ORAL | 0 refills | Status: AC

## 2023-09-29 NOTE — Telephone Encounter (Signed)
Prescription Request  09/29/2023  LOV: 09/15/2022  What is the name of the medication or equipment?   pravastatin (PRAVACHOL) 40 MG tablet  **URGENT 90 day script request**  Have you contacted your pharmacy to request a refill? Yes   Which pharmacy would you like this sent to?  [facility]) [place], [place] - [address] [facility] [address] [place] [address] [place]  [address] Phone: [phone] Fax: [phone]    Patient notified that their request is being sent to the clinical staff for review and that they should receive a response within 2 business days.   Please advise pharmacist.

## 2023-10-03 ENCOUNTER — Other Ambulatory Visit: Payer: Self-pay | Admitting: Family Medicine

## 2023-10-04 ENCOUNTER — Other Ambulatory Visit: Payer: Self-pay | Admitting: Family Medicine

## 2023-10-04 DIAGNOSIS — I10 Essential (primary) hypertension: Secondary | ICD-10-CM

## 2023-10-13 ENCOUNTER — Other Ambulatory Visit: Payer: Self-pay | Admitting: Family Medicine

## 2023-10-18 ENCOUNTER — Other Ambulatory Visit: Payer: Self-pay | Admitting: Family Medicine

## 2023-11-13 ENCOUNTER — Ambulatory Visit (INDEPENDENT_AMBULATORY_CARE_PROVIDER_SITE_OTHER): Admitting: Family Medicine

## 2023-11-13 ENCOUNTER — Other Ambulatory Visit: Payer: Self-pay

## 2023-11-13 ENCOUNTER — Encounter: Payer: Self-pay | Admitting: Family Medicine

## 2023-11-13 VITALS — BP 132/62 | HR 92 | Temp 97.7°F | Ht 67.0 in | Wt 221.0 lb

## 2023-11-13 DIAGNOSIS — G8929 Other chronic pain: Secondary | ICD-10-CM | POA: Diagnosis not present

## 2023-11-13 DIAGNOSIS — M545 Low back pain, unspecified: Secondary | ICD-10-CM

## 2023-11-13 MED ORDER — MELOXICAM 15 MG PO TABS: 15.0000 mg | ORAL_TABLET | Freq: Every day | ORAL | 3 refills | Status: DC

## 2023-11-13 MED ORDER — SOLIFENACIN SUCCINATE 10 MG PO TABS: 10.0000 mg | ORAL_TABLET | Freq: Every day | ORAL | 3 refills | Status: AC

## 2023-11-13 NOTE — Progress Notes (Signed)
 Subjective:    Patient ID: [facility], female    DOB: Apr 05, 1941, 83 y.o.   MRN: [id number] I have not seen the patient in over a year.  She presents today complaining of low back pain.  She states the pain in her back has been moderate to severe on a daily basis for months.  She reports tenderness to palpation in the lower back and the right lumbar paraspinal muscles.  She also reports some tenderness to palpation over the spinous processes of the lumbar spine roughly level of L5 and L4.  She denies any sciatica.  She denies any numbness or tingling radiating down her legs.  She does report weakness in her legs and trouble standing.  She has to use a wheelchair or a walker for ambulation.  CT scan of the abdomen and pelvis in 2023 revealed: Musculoskeletal: No acute osseous abnormality. Chronic T12 and L4 vertebral body compression fractures. Anterior bridging osteophytes throughout the thoracolumbar spine as can be seen with diffuse idiopathic skeletal hyperostosis. Degenerative disease with disc height loss at L3-4, L4-5 and L5-S1. Severe bilateral facet arthropathy at L3-4, L4-5 and L5-S1. Severe bilateral foraminal stenosis at L3-4. Severe right foraminal stenosis at L4-5. Severe right foraminal stenosis at L5-S1. Moderate left foraminal stenosis at L5-S1.  She also reports urinary frequency.  She states that she has to wear a diaper especially at night because she has urge incontinence.  She cannot make it to the bathroom in time.  Also she does not even feel urine coming.  She denies any dysuria or hematuria Past Medical History:  Diagnosis Date   (HFpEF) heart failure with preserved ejection fraction (HCC)    a.) TTE 07/22/2018: EF 45-50%; mild LVH; mild diffuse HK; mild LA dilation; PASP 30-35 mmHg; G1DD.   Alcohol abuse    Alcohol dependence with alcohol-induced mood disorder (HCC)    Anemia    Aortic atherosclerosis (HCC)    Arthritis    Brief psychotic disorder (HCC)  10/28/2016   a.) auditory hallucinations (people arguing in home who were not there); visual hallucinations (seeing dead husband); patient was involuntarily committed/admitted to [facility].   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   Weakness    Past Surgical History:  Procedure Laterality Date   ANKLE ARTHROSCOPY WITH OPEN REDUCTION INTERNAL FIXATION (ORIF) Right    APPENDECTOMY     CHOLECYSTECTOMY     COLONOSCOPY N/A 07/21/2018   Procedure: COLONOSCOPY;  Surgeon: Toney Reil, MD;  Location: [facility] ENDOSCOPY;  Service: Gastroenterology;  Laterality: N/A;   COLONOSCOPY WITH PROPOFOL N/A 04/01/2020   Procedure: COLONOSCOPY WITH PROPOFOL;  Surgeon: Pasty Spillers, MD;  Location: [facility] ENDOSCOPY;  Service: Endoscopy;  Laterality: N/A;   ESOPHAGOGASTRODUODENOSCOPY N/A 07/21/2018   Procedure: ESOPHAGOGASTRODUODENOSCOPY (EGD);  Surgeon: Toney Reil, MD;  Location: [facility] ENDOSCOPY;  Service: Gastroenterology;  Laterality: N/A;   ESOPHAGOGASTRODUODENOSCOPY N/A 02/11/2020   Procedure: ESOPHAGOGASTRODUODENOSCOPY (EGD);  Surgeon: Pasty Spillers, MD;  Location: [facility] ENDOSCOPY;  Service: Endoscopy;  Laterality: N/A;   ESOPHAGOGASTRODUODENOSCOPY (EGD) WITH PROPOFOL N/A 04/01/2020   Procedure: ESOPHAGOGASTRODUODENOSCOPY (EGD) WITH PROPOFOL;  Surgeon: Pasty Spillers, MD;  Location: [facility] ENDOSCOPY;  Service: Endoscopy;   Laterality: N/A;   FRACTURE SURGERY Left    leg fx   IR EXCHANGE BILIARY DRAIN  10/22/2021   IR PERC CHOLECYSTOSTOMY  09/17/2021   TUBAL LIGATION     Current Outpatient Medications on File Prior to Visit  Medication Sig Dispense Refill   Ascorbic Acid (VITAMIN C PO) Take by mouth.     BIOTIN PO Take 1 tablet by mouth daily.     BREZTRI AEROSPHERE 160-9-4.8 MCG/ACT AERO USE 2 INHALATIONS BY MOUTH INTO  THE LUNGS IN THE MORNING AND AT  BEDTIME 32.1 g 3   calcipotriene (DOVONOX) 0.005 % cream Apply topically 2 (two) times daily. Apply ti itchy areas of arms and legs twice daily - Mix with Cerave anti-itch lotion 60 g 0   Calcium Carbonate-Vitamin D (CALCIUM + D PO) Take 1 capsule by mouth 2 (two) times daily.     Cholecalciferol (VITAMIN D) 2000 UNITS CAPS Take 2,000 Units by mouth daily.     Cyanocobalamin (VITAMIN B-12 PO) Take by mouth.     dorzolamide (TRUSOPT) 2 % ophthalmic solution Place 1 drop into the left eye 2 (two) times daily.     doxazosin (CARDURA) 4 MG tablet TAKE ONE-HALF TABLET BY MOUTH  DAILY 50 tablet 2   feeding supplement (ENSURE SURGERY) LIQD Take 237 mLs by mouth 2 (two) times daily between meals.     fish oil-omega-3 fatty acids 1000 MG capsule Take 1,000 mg by mouth daily.     gabapentin (NEURONTIN) 100 MG capsule Take 1 capsule (100 mg total) by mouth 3 (three) times daily as needed (itching). 90 capsule 3   HYDROcodone-acetaminophen (NORCO/VICODIN) 5-325 MG tablet Take 1 tablet by mouth every 6 (six) hours as needed for moderate pain. 15 tablet 0   ibuprofen (ADVIL) 800 MG tablet Take 1 tablet (800 mg total) by mouth every 8 (eight) hours as needed. 30 tablet 0   latanoprost (XALATAN) 0.005 % ophthalmic solution Place 1 drop into the left eye at bedtime.      lisinopril (ZESTRIL) 40 MG tablet TAKE 1 TABLET BY MOUTH DAILY 100 tablet 2   magnesium 30 MG tablet Take 30 mg by mouth daily.     melatonin 5 MG TABS Take 5 mg by mouth at bedtime.     mometasone (ELOCON)  0.1 % cream Apply topically daily. 45 g 1   Multiple Vitamin (MULTIVITAMIN) tablet Take 1 tablet by mouth daily.     potassium chloride (KLOR-CON) 10 MEQ tablet TAKE 1 TABLET BY MOUTH DAILY 90 tablet 3   pravastatin (PRAVACHOL) 40 MG tablet Take 1 tablet (40 mg total) by mouth daily. 09/29/23-PT NEEDS APPOINTMENT 30 tablet 0   predniSONE (DELTASONE) 20 MG tablet 3 tabs poqday 1-2, 2 tabs poqday 3-4, 1 tab poqday 5-6 12 tablet 0   traZODone (DESYREL) 50 MG tablet TAKE 1 TABLET BY MOUTH AT  BEDTIME 90 tablet 3   venlafaxine XR (EFFEXOR-XR) 75 MG 24 hr capsule TAKE 1 CAPSULE BY MOUTH  DAILY WITH BREAKFAST 90 capsule 3   verapamil (CALAN-SR) 240 MG CR tablet TAKE 1  TABLET BY MOUTH AT  BEDTIME 90 tablet 3   vitamin E 180 MG (400 UNITS) capsule Take 400 Units by mouth daily.     No current facility-administered medications on file prior to visit.   Allergies  Allergen Reactions   Oxycodone Itching   Social History   Socioeconomic History   Marital status: Widowed    Spouse name: Not on file   Number of children: 1   Years of education: HS   Highest education level: Not on file  Occupational History   Occupation: Retired  Tobacco Use   Smoking status: Former    Current packs/day: 0.00    Average packs/day: 1 pack/day for 15.0 years (15.0 ttl pk-yrs)    Types: Cigarettes    Start date: 12/27/1965    Quit date: 12/27/1980    Years since quitting: 42.9   Smokeless tobacco: Never  Vaping Use   Vaping status: Never Used  Substance and Sexual Activity   Alcohol use: Not Currently    Comment: occassionally   Drug use: No   Sexual activity: Not Currently  Other Topics Concern   Not on file  Social History Narrative   Lives at home alone. Husband passed away 11-25-12.   Right-handed.   No daily use of caffeine.   Daughter lives in [place].   Social Drivers of Corporate investment banker Strain: Low Risk  (05/25/2022)   Overall Financial Resource Strain (CARDIA)    Difficulty of Paying  Living Expenses: Not hard at all  Food Insecurity: No Food Insecurity (05/25/2022)   Hunger Vital Sign    Worried About Running Out of Food in the Last Year: Never true    Ran Out of Food in the Last Year: Never true  Transportation Needs: No Transportation Needs (05/25/2022)   PRAPARE - Administrator, Civil Service (Medical): No    Lack of Transportation (Non-Medical): No  Physical Activity: Inactive (05/25/2022)   Exercise Vital Sign    Days of Exercise per Week: 0 days    Minutes of Exercise per Session: 0 min  Stress: No Stress Concern Present (05/25/2022)   [employer] of Occupational Health - Occupational Stress Questionnaire    Feeling of Stress : Not at all  Social Connections: Socially Integrated (05/25/2022)   Social Connection and Isolation Panel [NHANES]    Frequency of Communication with Friends and Family: More than three times a week    Frequency of Social Gatherings with Friends and Family: More than three times a week    Attends Religious Services: More than 4 times per year    Active Member of [employer] or Organizations: Yes    Attends Banker Meetings: More than 4 times per year    Marital Status: Married  Catering manager Violence: Not At Risk (05/25/2022)   Humiliation, Afraid, Rape, and Kick questionnaire    Fear of Current or Ex-Partner: No    Emotionally Abused: No    Physically Abused: No    Sexually Abused: No     Review of Systems  All other systems reviewed and are negative.      Objective:   Physical Exam Constitutional:      General: She is not in acute distress.    Appearance: She is not ill-appearing, toxic-appearing or diaphoretic.  HENT:     Mouth/Throat:     Mouth: Mucous membranes are moist.  Neck:     Vascular: No carotid bruit.  Cardiovascular:  Rate and Rhythm: Normal rate and regular rhythm.     Heart sounds: Normal heart sounds. No murmur heard. Pulmonary:     Effort: Pulmonary effort is normal.  No respiratory distress.     Breath sounds: No stridor. No wheezing, rhonchi or rales.  Chest:     Chest wall: No tenderness.  Musculoskeletal:     Lumbar back: Spasms and bony tenderness present. No swelling, deformity, signs of trauma or lacerations. Decreased range of motion.       Back:     Right lower leg: No edema.     Left lower leg: No edema.  Skin:    Findings: Rash is not crusting, nodular, purpuric, pustular, urticarial or vesicular.  Neurological:     General: No focal deficit present.     Mental Status: She is alert. Mental status is at baseline.     Sensory: Sensation is intact.     Coordination: Coordination is intact.     Gait: Gait is intact.          Assessment & Plan:  Chronic right-sided low back pain without sciatica - Plan: CBC with Differential/Platelet, COMPLETE METABOLIC PANEL WITHOUT GFR I believe this is multifactorial related to degenerative disc disease facet arthropathy and even vertebral fracture.  I will start the patient on meloxicam 15 mg a day to try to help ease her back pain.  Meanwhile check CBC and a CMP to ensure that there is no contraindications to using meloxicam.  Also give the patient a prescription for Vesicare 10 mg daily for overactive bladder hopefully to help decrease the frequency of urinary incontinence

## 2023-11-14 ENCOUNTER — Telehealth: Payer: Self-pay

## 2023-11-14 ENCOUNTER — Other Ambulatory Visit: Payer: Self-pay

## 2023-11-14 ENCOUNTER — Inpatient Hospital Stay
Admission: EM | Admit: 2023-11-14 | Discharge: 2023-11-17 | DRG: 812 | Disposition: A | Discharge status: Home / Self Care | Attending: Internal Medicine | Admitting: Internal Medicine

## 2023-11-14 ENCOUNTER — Encounter: Payer: Self-pay | Admitting: Intensive Care

## 2023-11-14 DIAGNOSIS — Z885 Allergy status to narcotic agent status: Secondary | ICD-10-CM

## 2023-11-14 DIAGNOSIS — Z886 Allergy status to analgesic agent status: Secondary | ICD-10-CM

## 2023-11-14 DIAGNOSIS — Z8673 Personal history of transient ischemic attack (TIA), and cerebral infarction without residual deficits: Secondary | ICD-10-CM | POA: Diagnosis not present

## 2023-11-14 DIAGNOSIS — R54 Age-related physical debility: Secondary | ICD-10-CM | POA: Diagnosis present

## 2023-11-14 DIAGNOSIS — M549 Dorsalgia, unspecified: Secondary | ICD-10-CM | POA: Diagnosis not present

## 2023-11-14 DIAGNOSIS — E785 Hyperlipidemia, unspecified: Secondary | ICD-10-CM | POA: Diagnosis not present

## 2023-11-14 DIAGNOSIS — D649 Anemia, unspecified: Secondary | ICD-10-CM | POA: Diagnosis not present

## 2023-11-14 DIAGNOSIS — K449 Diaphragmatic hernia without obstruction or gangrene: Secondary | ICD-10-CM | POA: Diagnosis not present

## 2023-11-14 DIAGNOSIS — Z79899 Other long term (current) drug therapy: Secondary | ICD-10-CM | POA: Diagnosis not present

## 2023-11-14 DIAGNOSIS — Z791 Long term (current) use of non-steroidal anti-inflammatories (NSAID): Secondary | ICD-10-CM | POA: Diagnosis not present

## 2023-11-14 DIAGNOSIS — I130 Hypertensive heart and chronic kidney disease with heart failure and stage 1 through stage 4 chronic kidney disease, or unspecified chronic kidney disease: Secondary | ICD-10-CM | POA: Diagnosis not present

## 2023-11-14 DIAGNOSIS — G4700 Insomnia, unspecified: Secondary | ICD-10-CM | POA: Diagnosis not present

## 2023-11-14 DIAGNOSIS — J449 Chronic obstructive pulmonary disease, unspecified: Secondary | ICD-10-CM | POA: Diagnosis not present

## 2023-11-14 DIAGNOSIS — G629 Polyneuropathy, unspecified: Secondary | ICD-10-CM | POA: Diagnosis present

## 2023-11-14 DIAGNOSIS — K2100 Gastro-esophageal reflux disease with esophagitis, without bleeding: Secondary | ICD-10-CM | POA: Diagnosis not present

## 2023-11-14 DIAGNOSIS — I5022 Chronic systolic (congestive) heart failure: Secondary | ICD-10-CM | POA: Diagnosis present

## 2023-11-14 DIAGNOSIS — F03918 Unspecified dementia, unspecified severity, with other behavioral disturbance: Secondary | ICD-10-CM | POA: Diagnosis present

## 2023-11-14 DIAGNOSIS — G8929 Other chronic pain: Secondary | ICD-10-CM | POA: Insufficient documentation

## 2023-11-14 DIAGNOSIS — Z87891 Personal history of nicotine dependence: Secondary | ICD-10-CM

## 2023-11-14 DIAGNOSIS — R0902 Hypoxemia: Secondary | ICD-10-CM | POA: Diagnosis not present

## 2023-11-14 DIAGNOSIS — G9340 Encephalopathy, unspecified: Secondary | ICD-10-CM | POA: Diagnosis not present

## 2023-11-14 DIAGNOSIS — I2510 Atherosclerotic heart disease of native coronary artery without angina pectoris: Secondary | ICD-10-CM | POA: Diagnosis not present

## 2023-11-14 DIAGNOSIS — I10 Essential (primary) hypertension: Secondary | ICD-10-CM | POA: Diagnosis present

## 2023-11-14 DIAGNOSIS — F05 Delirium due to known physiological condition: Secondary | ICD-10-CM | POA: Diagnosis not present

## 2023-11-14 DIAGNOSIS — D509 Iron deficiency anemia, unspecified: Secondary | ICD-10-CM | POA: Diagnosis not present

## 2023-11-14 DIAGNOSIS — I5042 Chronic combined systolic (congestive) and diastolic (congestive) heart failure: Secondary | ICD-10-CM | POA: Diagnosis present

## 2023-11-14 DIAGNOSIS — N183 Chronic kidney disease, stage 3 unspecified: Secondary | ICD-10-CM | POA: Diagnosis present

## 2023-11-14 DIAGNOSIS — E669 Obesity, unspecified: Secondary | ICD-10-CM | POA: Diagnosis present

## 2023-11-14 DIAGNOSIS — Z6835 Body mass index (BMI) 35.0-35.9, adult: Secondary | ICD-10-CM

## 2023-11-14 DIAGNOSIS — K219 Gastro-esophageal reflux disease without esophagitis: Secondary | ICD-10-CM | POA: Diagnosis not present

## 2023-11-14 DIAGNOSIS — R0989 Other specified symptoms and signs involving the circulatory and respiratory systems: Secondary | ICD-10-CM | POA: Diagnosis not present

## 2023-11-14 DIAGNOSIS — K810 Acute cholecystitis: Secondary | ICD-10-CM | POA: Diagnosis not present

## 2023-11-14 DIAGNOSIS — Z800 Family history of malignant neoplasm of digestive organs: Secondary | ICD-10-CM

## 2023-11-14 DIAGNOSIS — F1010 Alcohol abuse, uncomplicated: Secondary | ICD-10-CM | POA: Diagnosis present

## 2023-11-14 DIAGNOSIS — K760 Fatty (change of) liver, not elsewhere classified: Secondary | ICD-10-CM | POA: Diagnosis present

## 2023-11-14 DIAGNOSIS — J4489 Other specified chronic obstructive pulmonary disease: Secondary | ICD-10-CM | POA: Diagnosis not present

## 2023-11-14 DIAGNOSIS — Z01810 Encounter for preprocedural cardiovascular examination: Secondary | ICD-10-CM | POA: Diagnosis not present

## 2023-11-14 DIAGNOSIS — I429 Cardiomyopathy, unspecified: Secondary | ICD-10-CM | POA: Diagnosis present

## 2023-11-14 LAB — COMPREHENSIVE METABOLIC PANEL WITH GFR
ALT: 20 U/L (ref 0–44)
AST: 19 U/L (ref 15–41)
Albumin: 3.6 g/dL (ref 3.5–5.0)
Alkaline Phosphatase: 47 U/L (ref 38–126)
Anion gap: 9 (ref 5–15)
BUN: 19 mg/dL (ref 8–23)
CO2: 22 mmol/L (ref 22–32)
Calcium: 8.8 mg/dL — ABNORMAL LOW (ref 8.9–10.3)
Chloride: 109 mmol/L (ref 98–111)
Creatinine, Ser: 1.25 mg/dL — ABNORMAL HIGH (ref 0.44–1.00)
GFR, Estimated: 43 mL/min — ABNORMAL LOW (ref >60)
Glucose, Bld: 102 mg/dL — ABNORMAL HIGH (ref 70–99)
Potassium: 4.1 mmol/L (ref 3.5–5.1)
Sodium: 140 mmol/L (ref 135–145)
Total Bilirubin: 0.4 mg/dL (ref 0.0–1.2)
Total Protein: 6.4 g/dL — ABNORMAL LOW (ref 6.5–8.1)

## 2023-11-14 LAB — COMPLETE METABOLIC PANEL WITHOUT GFR
AG Ratio: 1.7 (calc) (ref 1.0–2.5)
ALT: 14 U/L (ref 6–29)
AST: 11 U/L (ref 10–35)
Albumin: 4 g/dL (ref 3.6–5.1)
Alkaline phosphatase (APISO): 52 U/L (ref 37–153)
BUN/Creatinine Ratio: 16 (calc) (ref 6–22)
BUN: 18 mg/dL (ref 7–25)
CO2: 24 mmol/L (ref 20–32)
Calcium: 9.6 mg/dL (ref 8.6–10.4)
Chloride: 110 mmol/L (ref 98–110)
Creat: 1.12 mg/dL — ABNORMAL HIGH (ref 0.60–0.95)
Globulin: 2.3 g/dL (ref 1.9–3.7)
Glucose, Bld: 103 mg/dL — ABNORMAL HIGH (ref 65–99)
Potassium: 4.4 mmol/L (ref 3.5–5.3)
Sodium: 143 mmol/L (ref 135–146)
Total Bilirubin: 0.3 mg/dL (ref 0.2–1.2)
Total Protein: 6.3 g/dL (ref 6.1–8.1)

## 2023-11-14 LAB — CBC WITH DIFFERENTIAL/PLATELET
Abs Immature Granulocytes: 0.01 10*3/uL (ref 0.00–0.07)
Absolute Lymphocytes: 1134 {cells}/uL (ref 850–3900)
Absolute Monocytes: 524 {cells}/uL (ref 200–950)
Basophils Absolute: 40 {cells}/uL (ref 0–200)
Basophils Absolute: 0 10*3/uL (ref 0.0–0.1)
Basophils Relative: 0.7 %
Basophils Relative: 1 %
Eosinophils Absolute: 371 {cells}/uL (ref 15–500)
Eosinophils Absolute: 0.4 10*3/uL (ref 0.0–0.5)
Eosinophils Relative: 6.5 %
Eosinophils Relative: 7 %
HCT: 18.9 % — ABNORMAL LOW (ref 35.0–45.0)
HCT: 17.5 % — ABNORMAL LOW (ref 36.0–46.0)
Hemoglobin: 5.1 g/dL — CL (ref 11.7–15.5)
Hemoglobin: 5 g/dL — ABNORMAL LOW (ref 12.0–15.0)
Immature Granulocytes: 0 %
Lymphocytes Relative: 21 %
Lymphs Abs: 1.3 10*3/uL (ref 0.7–4.0)
MCH: 21.8 pg — ABNORMAL LOW (ref 27.0–33.0)
MCH: 22.5 pg — ABNORMAL LOW (ref 26.0–34.0)
MCHC: 27 g/dL — ABNORMAL LOW (ref 32.0–36.0)
MCHC: 28.6 g/dL — ABNORMAL LOW (ref 30.0–36.0)
MCV: 80.8 fL (ref 80.0–100.0)
MCV: 78.8 fL — ABNORMAL LOW (ref 80.0–100.0)
MPV: 10 fL (ref 7.5–12.5)
Monocytes Absolute: 0.6 10*3/uL (ref 0.1–1.0)
Monocytes Relative: 9.2 %
Monocytes Relative: 9 %
Neutro Abs: 3631 {cells}/uL (ref 1500–7800)
Neutro Abs: 3.8 10*3/uL (ref 1.7–7.7)
Neutrophils Relative %: 63.7 %
Neutrophils Relative %: 62 %
Platelets: 310 10*3/uL (ref 140–400)
Platelets: 310 10*3/uL (ref 150–400)
RBC: 2.34 10*6/uL — ABNORMAL LOW (ref 3.80–5.10)
RBC: 2.22 MIL/uL — ABNORMAL LOW (ref 3.87–5.11)
RDW: 16.4 % — ABNORMAL HIGH (ref 11.0–15.0)
RDW: 17.6 % — ABNORMAL HIGH (ref 11.5–15.5)
Total Lymphocyte: 19.9 %
WBC: 5.7 10*3/uL (ref 3.8–10.8)
WBC: 6.1 10*3/uL (ref 4.0–10.5)
nRBC: 0 % (ref 0.0–0.2)

## 2023-11-14 LAB — HEMOGLOBIN AND HEMATOCRIT, BLOOD
HCT: 24.7 % — ABNORMAL LOW (ref 36.0–46.0)
Hemoglobin: 7.7 g/dL — ABNORMAL LOW (ref 12.0–15.0)

## 2023-11-14 LAB — IRON AND TIBC
Iron: 9 ug/dL — ABNORMAL LOW (ref 28–170)
Saturation Ratios: 2 % — ABNORMAL LOW (ref 10.4–31.8)
TIBC: 477 ug/dL — ABNORMAL HIGH (ref 250–450)
UIBC: 468 ug/dL

## 2023-11-14 LAB — FERRITIN: Ferritin: 2 ng/mL — ABNORMAL LOW (ref 11–307)

## 2023-11-14 LAB — RETICULOCYTES
Immature Retic Fract: 8 % (ref 2.3–15.9)
RBC.: 2.21 MIL/uL — ABNORMAL LOW (ref 3.87–5.11)
Retic Count, Absolute: 33.4 10*3/uL (ref 19.0–186.0)
Retic Ct Pct: 1.5 % (ref 0.4–3.1)

## 2023-11-14 LAB — PREPARE RBC (CROSSMATCH)

## 2023-11-14 LAB — FOLATE: Folate: >40 ng/mL (ref >5.9)

## 2023-11-14 MED ORDER — ADULT MULTIVITAMIN W/MINERALS CH
1.0000 | ORAL_TABLET | Freq: Every day | ORAL | Status: DC
Start: 2023-11-15 — End: 2023-11-17
  Administered 2023-11-15 – 2023-11-17 (×3): 1 via ORAL
  Filled 2023-11-14 (×3): qty 1

## 2023-11-14 MED ORDER — ONDANSETRON HCL 4 MG/2ML IJ SOLN: 4.0000 mg | Freq: Four times a day (QID) | INTRAMUSCULAR | Status: DC | PRN

## 2023-11-14 MED ORDER — TRAZODONE HCL 50 MG PO TABS
50.0000 mg | ORAL_TABLET | Freq: Every day | ORAL | Status: DC
  Administered 2023-11-14 – 2023-11-16 (×3): 50 mg via ORAL
  Filled 2023-11-14 (×3): qty 1

## 2023-11-14 MED ORDER — PRAVASTATIN SODIUM 40 MG PO TABS
40.0000 mg | ORAL_TABLET | Freq: Every day | ORAL | Status: DC
  Administered 2023-11-14 – 2023-11-17 (×4): 40 mg via ORAL
  Filled 2023-11-14 (×4): qty 1

## 2023-11-14 MED ORDER — OMEGA-3-ACID ETHYL ESTERS 1 G PO CAPS
1.0000 g | ORAL_CAPSULE | Freq: Every day | ORAL | Status: DC
Start: 2023-11-15 — End: 2023-11-17
  Administered 2023-11-15 – 2023-11-17 (×3): 1 g via ORAL
  Filled 2023-11-14 (×3): qty 1

## 2023-11-14 MED ORDER — PANTOPRAZOLE SODIUM 40 MG IV SOLR
80.0000 mg | Freq: Once | INTRAVENOUS | Status: AC
  Administered 2023-11-14: 80 mg via INTRAVENOUS
  Filled 2023-11-14: qty 20

## 2023-11-14 MED ORDER — ENSURE SURGERY PO LIQD
237.0000 mL | Freq: Two times a day (BID) | ORAL | Status: DC
  Filled 2023-11-14: qty 237

## 2023-11-14 MED ORDER — LISINOPRIL 20 MG PO TABS
40.0000 mg | ORAL_TABLET | Freq: Every day | ORAL | Status: DC
  Administered 2023-11-15 – 2023-11-17 (×3): 40 mg via ORAL
  Filled 2023-11-14: qty 2
  Filled 2023-11-14 (×2): qty 4

## 2023-11-14 MED ORDER — ONDANSETRON HCL 4 MG PO TABS
4.0000 mg | ORAL_TABLET | Freq: Four times a day (QID) | ORAL | Status: DC | PRN
Start: 2023-11-14 — End: 2023-11-19

## 2023-11-14 MED ORDER — DOXAZOSIN MESYLATE 2 MG PO TABS
2.0000 mg | ORAL_TABLET | Freq: Every day | ORAL | Status: DC
  Administered 2023-11-14 – 2023-11-17 (×4): 2 mg via ORAL
  Filled 2023-11-14 (×4): qty 1

## 2023-11-14 MED ORDER — ALBUTEROL SULFATE (2.5 MG/3ML) 0.083% IN NEBU
2.5000 mg | INHALATION_SOLUTION | Freq: Four times a day (QID) | RESPIRATORY_TRACT | Status: DC | PRN
Start: 2023-11-14 — End: 2023-11-17
  Administered 2023-11-14 – 2023-11-17 (×4): 2.5 mg via RESPIRATORY_TRACT
  Filled 2023-11-14 (×4): qty 3

## 2023-11-14 MED ORDER — ACETAMINOPHEN 325 MG PO TABS: 650.0000 mg | ORAL_TABLET | Freq: Four times a day (QID) | ORAL | Status: DC | PRN

## 2023-11-14 MED ORDER — SODIUM CHLORIDE 0.9 % IV SOLN: 10.0000 mL/h | Freq: Once | INTRAVENOUS | Status: DC

## 2023-11-14 MED ORDER — PANTOPRAZOLE SODIUM 40 MG IV SOLR
80.0000 mg | Freq: Two times a day (BID) | INTRAVENOUS | Status: AC
  Administered 2023-11-14 – 2023-11-15 (×2): 80 mg via INTRAVENOUS
  Filled 2023-11-14 (×2): qty 20

## 2023-11-14 MED ORDER — ACETAMINOPHEN 650 MG RE SUPP: 650.0000 mg | Freq: Four times a day (QID) | RECTAL | Status: DC | PRN

## 2023-11-14 MED ORDER — SODIUM CHLORIDE 0.9% IV SOLUTION: Freq: Once | INTRAVENOUS | Status: AC

## 2023-11-14 MED ORDER — VITAMIN C 500 MG PO TABS
500.0000 mg | ORAL_TABLET | Freq: Every day | ORAL | Status: DC
  Administered 2023-11-15 – 2023-11-17 (×3): 500 mg via ORAL
  Filled 2023-11-14 (×3): qty 1

## 2023-11-14 MED ORDER — OMEGA-3 FATTY ACIDS 1000 MG PO CAPS: 1000.0000 mg | ORAL_CAPSULE | Freq: Every day | ORAL | Status: DC

## 2023-11-14 MED ORDER — LIDOCAINE 5 % EX PTCH: 1.0000 | MEDICATED_PATCH | Freq: Every day | CUTANEOUS | Status: DC | PRN

## 2023-11-14 MED ORDER — DORZOLAMIDE HCL 2 % OP SOLN
1.0000 [drp] | Freq: Two times a day (BID) | OPHTHALMIC | Status: DC
  Administered 2023-11-14 – 2023-11-16 (×4): 1 [drp] via OPHTHALMIC
  Filled 2023-11-14: qty 10

## 2023-11-14 NOTE — Assessment & Plan Note (Addendum)
 PDMP reviewed Currently no active opioid prescription Lidocaine patch, 1-2 patch as needed daily for back pain ordered

## 2023-11-14 NOTE — ED Notes (Signed)
 Called CCMD for central monitoring at this time

## 2023-11-14 NOTE — Assessment & Plan Note (Signed)
 Home lisinopril 40 mg resumed

## 2023-11-14 NOTE — Hospital Course (Signed)
 Ms. Editha Bridgeforth is a 83 year old female with history of hypertension, hyperlipidemia, chronic back pain, neuropathy, who presents emergency department for chief concerns of abnormal labs outpatient.  Vitals in the ED showed temperature of 97.8, respiration rate of 20, heart rate 74, blood pressure 122/48, SpO2 of 98% on room air.  Serum sodium is 140, potassium 4.1, chloride 109, bicarb 22, BUN of 19, serum creatinine 1.25, EGFR 43, nonfasting blood glucose 102, WBC 6.1, hemoglobin 5.0, platelets of 310.  AST, ALT were within normal limits.  Anemia panel was ordered by EDP and pending results at this time.  ED treatment: Patient was type and screen and 2 units PRBC have been ordered for transfusion.

## 2023-11-14 NOTE — Assessment & Plan Note (Addendum)
 Patient not in acute exacerbation at this time Echo from 07/31/2018: Estimated ejection fraction is 45 to 50% with diffuse hypokinesis Home doxazosin 2 mg daily resumed

## 2023-11-14 NOTE — Assessment & Plan Note (Signed)
Home trazodone 50 mg nightly resumed

## 2023-11-14 NOTE — Telephone Encounter (Signed)
 Critical lab results: Hgb of 5.1 drawn on 11/13/2023. Patient of Dr. Broadus John T. Pickard at [facility]. Was going to attempt to call patient but patient is currently in the ED awaiting admission. [facility] 11/14/2023

## 2023-11-14 NOTE — Assessment & Plan Note (Signed)
Home pravastatin 40 mg daily resumed

## 2023-11-14 NOTE — ED Provider Notes (Signed)
 [facility] Provider Note    Event Date/Time   First MD Initiated Contact with Patient 11/14/23 1040     (approximate)   History   Abnormal Lab   HPI  [facility] Walters is a 83 y.o. female multiple medical comorbidities including hypertension, hyperlipidemia, GERD, COPD, HFpEF, CKD stage III sent emergency department by PCP for abnormal hemoglobin of 5 and noting some dizziness -On my evaluation, patient states she is been feeling dizzy for the past week.  Denies black or bloody stools, vaginal bleeding, hematuria, hematemesis, hemoptysis.  Not on thinners. -No chest pain or shortness of breath     Physical Exam   Triage Vital Signs: ED Triage Vitals  Encounter Vitals Group     BP 11/14/23 0855 (!) 122/48     Systolic BP Percentile --      Diastolic BP Percentile --      Pulse Rate 11/14/23 0855 74     Resp 11/14/23 0855 20     Temp 11/14/23 0855 97.8 F (36.6 C)     Temp Source 11/14/23 0855 Oral     SpO2 11/14/23 0855 98 %     Weight 11/14/23 0856 227 lb (103 kg)     Height 11/14/23 0856 5\' 7"  (1.702 m)     Head Circumference --      Peak Flow --      Pain Score 11/14/23 0856 0     Pain Loc --      Pain Education --      Exclude from Growth Chart --     Most recent vital signs: Vitals:   11/14/23 1641 11/14/23 1715  BP:  (!) 158/55  Pulse: 73 75  Resp: 18 18  Temp: 98.4 F (36.9 C) 98.6 F (37 C)  SpO2:  100%     General: Awake, no distress.  CV:  Good peripheral perfusion.  RRR, RP 2+. Resp:  Normal effort.  CTAB. Abd:  No distention.  Tenderness to deep palpation throughout. RECTAL:  Digital rectal exam with soft brown stool, no blood nor melena appreciated.  Hemoccult negative. Neuro:  No focal motor deficits appreciated, moves all extremities spontaneously   ED Results / Procedures / Treatments   Labs (all labs ordered are listed, but only abnormal results are displayed) Labs Reviewed  CBC WITH  DIFFERENTIAL/PLATELET - Abnormal; Notable for the following components:      Result Value   RBC 2.22 (*)    Hemoglobin 5.0 (*)    HCT 17.5 (*)    MCV 78.8 (*)    MCH 22.5 (*)    MCHC 28.6 (*)    RDW 17.6 (*)    All other components within normal limits  COMPREHENSIVE METABOLIC PANEL WITH GFR - Abnormal; Notable for the following components:   Glucose, Bld 102 (*)    Creatinine, Ser 1.25 (*)    Calcium 8.8 (*)    Total Protein 6.4 (*)    GFR, Estimated 43 (*)    All other components within normal limits  IRON AND TIBC - Abnormal; Notable for the following components:   Iron 9 (*)    TIBC 477 (*)    Saturation Ratios 2 (*)    All other components within normal limits  FERRITIN - Abnormal; Notable for the following components:   Ferritin 2 (*)    All other components within normal limits  RETICULOCYTES - Abnormal; Notable for the following components:   RBC. 2.21 (*)  All other components within normal limits  FOLATE  VITAMIN B12  BASIC METABOLIC PANEL WITH GFR  CBC  TYPE AND SCREEN  PREPARE RBC (CROSSMATCH)     EKG  N/a   RADIOLOGY N/a  PROCEDURES:  Critical Care performed: Yes  .Critical Care  Performed by: Marinell Blight, MD Authorized by: Marinell Blight, MD   Critical care provider statement:    Critical care time (minutes):  30   Critical care time was exclusive of:  Separately billable procedures and treating other patients   Critical care was necessary to treat or prevent imminent or life-threatening deterioration of the following conditions: profound anemia requiring blood transfusion.   Critical care was time spent personally by me on the following activities:  Development of treatment plan with patient or surrogate, discussions with consultants, evaluation of patient's response to treatment, examination of patient, ordering and review of laboratory studies, ordering and performing treatments and interventions, pulse oximetry, re-evaluation of  patient's condition and review of old charts   I assumed direction of critical care for this patient from another provider in my specialty: no     Care discussed with: admitting provider      MEDICATIONS ORDERED IN ED: Medications  0.9 %  sodium chloride infusion (has no administration in time range)  acetaminophen (TYLENOL) tablet 650 mg (has no administration in time range)    Or  acetaminophen (TYLENOL) suppository 650 mg (has no administration in time range)  ondansetron (ZOFRAN) tablet 4 mg (has no administration in time range)    Or  ondansetron (ZOFRAN) injection 4 mg (has no administration in time range)  pantoprazole (PROTONIX) injection 80 mg (has no administration in time range)  pantoprazole (PROTONIX) injection 80 mg (80 mg Intravenous Given 11/14/23 1113)     IMPRESSION / MDM / ASSESSMENT AND PLAN / ED COURSE  I reviewed the triage vital signs and the nursing notes.                              Differential diagnosis includes, but is not limited to, gastrointestinal bleeding, consider vitamin deficiency anemia as etiology though unlikely given sharp decline and known history of prior gastric ulcers.  Fortunately no evidence of active bleeding at this time.  Suspect dizziness likely due to anemia, no satiated concerning findings on exam or history.  Patient's presentation is most consistent with acute presentation with potential threat to life or bodily function.  The patient is on the cardiac monitor to evaluate for evidence of arrhythmia and/or significant heart rate changes.  ED course below.  Patient was treated empirically with 80 mg IV pantoprazole for presumed GI etiology despite negative DRE.  Consented for blood transfusion, 2 units transfused.  Admitted to medicine service for monitoring and consideration of GI scope.  Hemodynamically stable in emergency department.   Clinical Course as of 11/14/23 1722  Tue Nov 14, 2023  1043 CBC with microcytic anemia,  stable from labs yesterday (hemoglobin 5.0 today, was 5.1 yesterday) though notably elevated from baseline of 11.8 about 1 year ago.  No thrombocytopenia or leukopenia.  CMP with normal BUN, baseline CKD [MM]  1135 DRE negative [MM]  1157 Chart reviewed, admitted in 2022 for symptomatic anemia, workup did show nonbleeding gastric ulcers.  Hospitalist consult ordered [MM]  1229 D/w hospitalist amy cox, will proceed w/ admission [MM]    Clinical Course User Index [MM] Marinell Blight, MD  FINAL CLINICAL IMPRESSION(S) / ED DIAGNOSES   Final diagnoses:  Symptomatic anemia     Rx / DC Orders   ED Discharge Orders     None        Note:  This document was prepared using Dragon voice recognition software and may include unintentional dictation errors.   Marinell Blight, MD 11/14/23 [phone]

## 2023-11-14 NOTE — ED Notes (Signed)
 Consent signed by patient.

## 2023-11-14 NOTE — Assessment & Plan Note (Signed)
 Not in acute exacerbation Albuterol nebulizer every 6 hours as needed for shortness of breath, wheezing, 3 days ordered

## 2023-11-14 NOTE — ED Triage Notes (Signed)
 Patient sent by pcp for abnormal hemoglobin of 5. Reports some dizziness  A&O x4 in triage

## 2023-11-14 NOTE — H&P (Addendum)
 History and Physical   [name] WJX:[id number] DOB: 1941-02-18 DOA: 11/14/2023  PCP: Donita Brooks, MD Patient coming from: home  I have personally briefly reviewed patient's old medical records in [facility] EMR.  Chief Concern: Abnormal labs  HPI: Ms. Virginia Walters is a 83 year old female with history of hypertension, hyperlipidemia, chronic back pain, neuropathy, who presents emergency department for chief concerns of abnormal labs outpatient.  Vitals in the ED showed temperature of 97.8, respiration rate of 20, heart rate 74, blood pressure 122/48, SpO2 of 98% on room air.  Serum sodium is 140, potassium 4.1, chloride 109, bicarb 22, BUN of 19, serum creatinine 1.25, EGFR 43, nonfasting blood glucose 102, WBC 6.1, hemoglobin 5.0, platelets of 310.  AST, ALT were within normal limits.  Anemia panel was ordered by EDP and pending results at this time.  ED treatment: Patient was type and screen and 2 units PRBC have been ordered for transfusion. --------------------------------- At bedside, patient was able to tell me her first and last name, age, location, current calendar year.  She reports that she has been having worsening weakness over the last few days with chronic back pain.  She reports that she does not take NSAIDs.  She reports that she came to her PCP and was being evaluated for an NSAID, meloxicam.  She was then told to come to the ED because of her low blood count.  She endorses generalized weakness especially with exertion.  She denies shortness of breath, chest pain, dysuria, hematuria, hemoptysis, hematemesis, coffee-ground emesis, melena stool.  She denies vaginal bleeding.  She reports that she wears depends and has not noticed any blood in the depends.  She denies bright red blood per rectum.  She denies vaginal bleeding.  She denies fever, chills, syncope, loss of consciousness.  She reports she had a similar episode in 2021 where she had hemoglobin  was low and got blood transfusion.  She had endoscopy and colonoscopy which were negative for signs of GI bleeding at that time.  She reports that she does not eat red meat regularly only occasionally.  She endorses that she does eat chicken.  She is not vegan.  Social history: She is at home alone.  She denies tobacco, EtOH, recreational drug use.  She is retired.  ROS: Constitutional: no weight change, no fever ENT/Mouth: no sore throat, no rhinorrhea Eyes: no eye pain, no vision changes Cardiovascular: no chest pain, no dyspnea,  no edema, no palpitations Respiratory: no cough, no sputum, no wheezing Gastrointestinal: no nausea, no vomiting, no diarrhea, no constipation Genitourinary: no urinary incontinence, no dysuria, no hematuria Musculoskeletal: no arthralgias, no myalgias Skin: no skin lesions, no pruritus, Neuro: + weakness, no loss of consciousness, no syncope Psych: no anxiety, no depression, no decrease appetite Heme/Lymph: no bruising, no bleeding  ED Course: Discussed with EDP, patient requiring hospitalization for chief concerns of severe anemia.  Assessment/Plan  Principal Problem:   Symptomatic anemia Active Problems:   HTN (hypertension)   Dyslipidemia   GERD (gastroesophageal reflux disease)   Dementia with behavioral problem   Chronic systolic heart failure (HCC)   Acute cholecystitis s/p perc cholecystotomy drain 09/17/2021   Insomnia   COPD (chronic obstructive pulmonary disease) (HCC)   Chronic back pain   Assessment and Plan:  * Symptomatic anemia Etiology workup in progress Anemia panel is in process Continue with clear liquid diet Status post Protonix 80 mg IV one-time dose per EDP Note: Patient was evaluated in June 2021  for hemoglobin of 6.0 and 6.1.  Patient was admitted and transfused and GI was consulted for endoscopy.  On endoscopy report, patient had nonbleeding angioectasia was found on upper endoscopy in 2021 treated with argon plasma;  nonbleeding gastric ulcer with clean ulcer base Inpatient hematology, Dr. Donneta Romberg has been consulted via secure chat and Epic order Recheck hemoglobin/hematocrit scheduled for day of admission at 2100 hrs.; goal hgb > 8  Addendum: repeat H/H after 2 units PRBC transfusion, patient had hgb of 7.7. 1 unit more unit prbc ordered for transfusion as patient has history of heart failure and coronary artery disease with atherosclerotic calcifications  Chronic back pain PDMP reviewed Currently no active opioid prescription Lidocaine patch, 1-2 patch as needed daily for back pain ordered  COPD (chronic obstructive pulmonary disease) (HCC) Not in acute exacerbation Albuterol nebulizer every 6 hours as needed for shortness of breath, wheezing, 3 days ordered  Insomnia Home trazodone 50 mg nightly resumed  Chronic systolic heart failure (HCC) Patient not in acute exacerbation at this time Echo from 07/31/2018: Estimated ejection fraction is 45 to 50% with diffuse hypokinesis Home doxazosin 2 mg daily resumed  GERD (gastroesophageal reflux disease) Protonix 80 mg IV twice daily, 2 doses ordered  Dyslipidemia Home pravastatin 40 mg daily resumed  HTN (hypertension) Home lisinopril 40 mg resumed  Chart reviewed.   DVT prophylaxis: TED hose Code Status: Full code Diet: Clear liquid diet Family Communication: Stasia Cavalier was called at [phone], no pick up. A messaged was left on Dolly's voicemail. Disposition Plan: pending clinical course Consults called: Inpatient hematology, Dr. Donneta Romberg Admission status: PCU, inpatient  Past Medical History:  Diagnosis Date   (HFpEF) heart failure with preserved ejection fraction (HCC)    a.) TTE 07/22/2018: EF 45-50%; mild LVH; mild diffuse HK; mild LA dilation; PASP 30-35 mmHg; G1DD.   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   Weakness    Past Surgical History:  Procedure Laterality Date   ANKLE ARTHROSCOPY WITH OPEN REDUCTION INTERNAL FIXATION (ORIF) Right    APPENDECTOMY     CHOLECYSTECTOMY     COLONOSCOPY N/A 07/21/2018   Procedure: COLONOSCOPY;  Surgeon: Toney Reil, MD;  Location: [facility] ENDOSCOPY;  Service: Gastroenterology;  Laterality: N/A;   COLONOSCOPY WITH PROPOFOL N/A 04/01/2020   Procedure: COLONOSCOPY WITH PROPOFOL;  Surgeon: Pasty Spillers, MD;  Location: [facility] ENDOSCOPY;  Service: Endoscopy;  Laterality: N/A;   ESOPHAGOGASTRODUODENOSCOPY N/A  07/21/2018   Procedure: ESOPHAGOGASTRODUODENOSCOPY (EGD);  Surgeon: Toney Reil, MD;  Location: [facility] ENDOSCOPY;  Service: Gastroenterology;  Laterality: N/A;   ESOPHAGOGASTRODUODENOSCOPY N/A 02/11/2020   Procedure: ESOPHAGOGASTRODUODENOSCOPY (EGD);  Surgeon: Pasty Spillers, MD;  Location: [facility] ENDOSCOPY;  Service: Endoscopy;  Laterality: N/A;   ESOPHAGOGASTRODUODENOSCOPY (EGD) WITH PROPOFOL N/A 04/01/2020   Procedure: ESOPHAGOGASTRODUODENOSCOPY (EGD) WITH PROPOFOL;  Surgeon: Pasty Spillers, MD;  Location: [facility] ENDOSCOPY;  Service:  Endoscopy;  Laterality: N/A;   FRACTURE SURGERY Left    leg fx   IR EXCHANGE BILIARY DRAIN  10/22/2021   IR PERC CHOLECYSTOSTOMY  09/17/2021   TUBAL LIGATION      Social History:  reports that she quit smoking about 42 years ago. Her smoking use included cigarettes. She started smoking about 57 years ago. She has a 15 pack-year smoking history. She has never used smokeless tobacco. She reports current alcohol use of about 1.0 standard drink of alcohol per week. She reports that she does not use drugs.  Allergies  Allergen Reactions   Ibuprofen    Oxycodone Itching   Family History  Problem Relation Age of Onset   Esophageal cancer Father    Dementia Mother    Family history: Family history reviewed and not pertinent.  Prior to Admission medications   Medication Sig Start Date End Date Taking? Authorizing Provider  Ascorbic Acid (VITAMIN C PO) Take by mouth.    [provider]  BIOTIN PO Take 1 tablet by mouth daily.    [provider]  BREZTRI AEROSPHERE 160-9-4.8 MCG/ACT AERO USE 2 INHALATIONS BY MOUTH INTO  THE LUNGS IN THE MORNING AND AT  BEDTIME 12/07/21   Donita Brooks, MD  calcipotriene (DOVONOX) 0.005 % cream Apply topically 2 (two) times daily. Apply ti itchy areas of arms and legs twice daily - Mix with Cerave anti-itch lotion 02/23/23   Terri Piedra, DO  Calcium Carbonate-Vitamin D (CALCIUM + D PO) Take 1 capsule by mouth 2 (two) times daily.    [provider]  Cholecalciferol (VITAMIN D) 2000 UNITS CAPS Take 2,000 Units by mouth daily.    [provider]  Cyanocobalamin (VITAMIN B-12 PO) Take by mouth.    [provider]  dorzolamide (TRUSOPT) 2 % ophthalmic solution Place 1 drop into the left eye 2 (two) times daily.    [provider]  doxazosin (CARDURA) 4 MG tablet TAKE ONE-HALF TABLET BY MOUTH  DAILY 10/04/23   Donita Brooks, MD  feeding supplement (ENSURE SURGERY) LIQD Take 237 mLs by mouth 2 (two)  times daily between meals. 09/21/21   Narda Bonds, MD  fish oil-omega-3 fatty acids 1000 MG capsule Take 1,000 mg by mouth daily.    [provider]  gabapentin (NEURONTIN) 100 MG capsule Take 1 capsule (100 mg total) by mouth 3 (three) times daily as needed (itching). 09/15/22   Donita Brooks, MD  HYDROcodone-acetaminophen (NORCO/VICODIN) 5-325 MG tablet Take 1 tablet by mouth every 6 (six) hours as needed for moderate pain. 11/17/21   Campbell Lerner, MD  ibuprofen (ADVIL) 800 MG tablet Take 1 tablet (800 mg total) by mouth every 8 (eight) hours as needed. 11/17/21   Campbell Lerner, MD  latanoprost (XALATAN) 0.005 % ophthalmic solution Place 1 drop into the left eye at bedtime.  12/05/17   [provider]  lisinopril (ZESTRIL) 40 MG tablet TAKE 1 TABLET BY MOUTH DAILY 10/04/23   Donita Brooks, MD  magnesium 30 MG tablet Take  30 mg by mouth daily.    [provider]  melatonin 5 MG TABS Take 5 mg by mouth at bedtime.    [provider]  meloxicam (MOBIC) 15 MG tablet Take 1 tablet (15 mg total) by mouth daily. 11/13/23   Donita Brooks, MD  mometasone (ELOCON) 0.1 % cream Apply topically daily. 09/15/22   Donita Brooks, MD  Multiple Vitamin (MULTIVITAMIN) tablet Take 1 tablet by mouth daily.    [provider]  potassium chloride (KLOR-CON) 10 MEQ tablet TAKE 1 TABLET BY MOUTH DAILY 09/12/22   Donita Brooks, MD  pravastatin (PRAVACHOL) 40 MG tablet Take 1 tablet (40 mg total) by mouth daily. 09/29/23-PT NEEDS APPOINTMENT 09/29/23   Donita Brooks, MD  predniSONE (DELTASONE) 20 MG tablet 3 tabs poqday 1-2, 2 tabs poqday 3-4, 1 tab poqday 5-6 09/05/22   Donita Brooks, MD  solifenacin (VESICARE) 10 MG tablet Take 1 tablet (10 mg total) by mouth daily. 11/13/23   Donita Brooks, MD  traZODone (DESYREL) 50 MG tablet TAKE 1 TABLET BY MOUTH AT  BEDTIME 10/20/23   Donita Brooks, MD  venlafaxine XR (EFFEXOR-XR) 75 MG 24 hr capsule TAKE 1  CAPSULE BY MOUTH  DAILY WITH BREAKFAST 04/09/21   Donita Brooks, MD  verapamil (CALAN-SR) 240 MG CR tablet TAKE 1 TABLET BY MOUTH AT  BEDTIME 06/06/22   Donita Brooks, MD  vitamin E 180 MG (400 UNITS) capsule Take 400 Units by mouth daily.    [provider]   Physical Exam: Vitals:   11/14/23 1641 11/14/23 1715 11/14/23 2037 11/14/23 2100  BP:  (!) 158/55 (!) 161/71 (!) 166/60  Pulse: 73 75 72 74  Resp: 18 18 18  (!) 25  Temp: 98.4 F (36.9 C) 98.6 F (37 C) 98.3 F (36.8 C)   TempSrc: Oral Oral Oral   SpO2:  100% 97% 95%  Weight:      Height:       Constitutional: appears age-appropriate, frail, calm Eyes: PERRL, lids and conjunctivae normal ENMT: Mucous membranes are moist. Posterior pharynx clear of any exudate or lesions. Age-appropriate dentition. Hearing appropriate Neck: normal, supple, no masses, no thyromegaly Respiratory: clear to auscultation bilaterally, no wheezing, no crackles. Normal respiratory effort. No accessory muscle use.  Cardiovascular: Regular rate and rhythm, no murmurs / rubs / gallops. No extremity edema. 2+ pedal pulses. No carotid bruits.  Abdomen: Obese abdomen, no tenderness, no masses palpated, no hepatosplenomegaly. Bowel sounds positive.  Musculoskeletal: no clubbing / cyanosis. No joint deformity upper and lower extremities. Good ROM, no contractures, no atrophy. Normal muscle tone.  Skin: Pale skin, no rashes, lesions, ulcers. No induration Neurologic: Sensation intact. Strength 5/5 in all 4.  Psychiatric: Normal judgment and insight. Alert and oriented x 3. Normal mood.   EKG: EKG ordered on admission, pending completion at this time  Chest x-ray on Admission: not indiciated at this time No results found.  Labs on Admission: I have personally reviewed following labs CBC: Recent Labs  Lab 11/13/23 0939 11/14/23 0858 11/14/23 2230  WBC 5.7 6.1  --   NEUTROABS 3,631 3.8  --   HGB 5.1* 5.0* 7.7*  HCT 18.9* 17.5* 24.7*   MCV 80.8 78.8*  --   PLT 310 310  --    Basic Metabolic Panel: Recent Labs  Lab 11/13/23 0939 11/14/23 0858  NA 143 140  K 4.4 4.1  CL 110 109  CO2 24 22  GLUCOSE 103* 102*  BUN 18 19  CREATININE 1.12* 1.25*  CALCIUM 9.6 8.8*   GFR: Estimated Creatinine Clearance: 42.8 mL/min (A) (by C-G formula based on SCr of 1.25 mg/dL (H)).  Liver Function Tests: Recent Labs  Lab 11/13/23 0939 11/14/23 0858  AST 11 19  ALT 14 20  ALKPHOS  --  47  BILITOT 0.3 0.4  PROT 6.3 6.4*  ALBUMIN  --  3.6   Anemia Panel: Recent Labs    11/14/23 0858  FOLATE >40.0  FERRITIN 2*  TIBC 477*  IRON 9*  RETICCTPCT 1.5   Urine analysis:    Component Value Date/Time   COLORURINE YELLOW (A) 10/08/2021 1015   APPEARANCEUR CLOUDY (A) 10/08/2021 1015   LABSPEC 1.011 10/08/2021 1015   PHURINE 5.0 10/08/2021 1015   GLUCOSEU NEGATIVE 10/08/2021 1015   HGBUR NEGATIVE 10/08/2021 1015   BILIRUBINUR NEGATIVE 10/08/2021 1015   KETONESUR NEGATIVE 10/08/2021 1015   PROTEINUR NEGATIVE 10/08/2021 1015   NITRITE NEGATIVE 10/08/2021 1015   LEUKOCYTESUR LARGE (A) 10/08/2021 1015   CRITICAL CARE Performed by: Dr. Sedalia Muta  Total critical care time: 32 minutes  Critical care time was exclusive of separately billable procedures and treating other patients.  Critical care was necessary to treat or prevent imminent or life-threatening deterioration.  Critical care was time spent personally by me on the following activities: development of treatment plan with patient as well as nursing, discussions with consultants, evaluation of patient's response to treatment, examination of patient, obtaining history from patient or surrogate, ordering and performing treatments and interventions, ordering and review of laboratory studies, ordering and review of radiographic studies, pulse oximetry and re-evaluation of patient's condition.  This document was prepared using Dragon Voice Recognition software and may include  unintentional dictation errors.  Dr. Sedalia Muta [facility]  If 7PM-7AM, please contact overnight-coverage provider If 7AM-7PM, please contact day attending provider [email or web address]  11/14/2023, 10:56 PM

## 2023-11-14 NOTE — Assessment & Plan Note (Signed)
 Protonix 80 mg IV twice daily, 2 doses ordered

## 2023-11-14 NOTE — Assessment & Plan Note (Addendum)
 Etiology workup in progress Anemia panel is in process Continue with clear liquid diet Status post Protonix 80 mg IV one-time dose per EDP Note: Patient was evaluated in June 2021 for hemoglobin of 6.0 and 6.1.  Patient was admitted and transfused and GI was consulted for endoscopy.  On endoscopy report, patient had nonbleeding angioectasia was found on upper endoscopy in 2021 treated with argon plasma; nonbleeding gastric ulcer with clean ulcer base Inpatient hematology, Dr. Donneta Romberg has been consulted via secure chat and Epic order Recheck hemoglobin/hematocrit scheduled for day of admission at 2100 hrs.; goal hgb > 8  Addendum: repeat H/H after 2 units PRBC transfusion, patient had hgb of 7.7. 1 unit more unit prbc ordered for transfusion as patient has history of heart failure and coronary artery disease with atherosclerotic calcifications

## 2023-11-14 NOTE — ED Notes (Signed)
 Issues with IV. Blood reached patient at this time.

## 2023-11-15 ENCOUNTER — Other Ambulatory Visit: Payer: Self-pay

## 2023-11-15 ENCOUNTER — Telehealth: Payer: Self-pay | Admitting: Cardiovascular Disease

## 2023-11-15 ENCOUNTER — Other Ambulatory Visit: Payer: Self-pay | Admitting: Internal Medicine

## 2023-11-15 DIAGNOSIS — K2100 Gastro-esophageal reflux disease with esophagitis, without bleeding: Secondary | ICD-10-CM | POA: Diagnosis not present

## 2023-11-15 DIAGNOSIS — G4700 Insomnia, unspecified: Secondary | ICD-10-CM

## 2023-11-15 DIAGNOSIS — I5022 Chronic systolic (congestive) heart failure: Secondary | ICD-10-CM | POA: Diagnosis not present

## 2023-11-15 DIAGNOSIS — J449 Chronic obstructive pulmonary disease, unspecified: Secondary | ICD-10-CM

## 2023-11-15 DIAGNOSIS — D649 Anemia, unspecified: Secondary | ICD-10-CM | POA: Diagnosis not present

## 2023-11-15 DIAGNOSIS — G8929 Other chronic pain: Secondary | ICD-10-CM

## 2023-11-15 DIAGNOSIS — Z01810 Encounter for preprocedural cardiovascular examination: Secondary | ICD-10-CM | POA: Diagnosis not present

## 2023-11-15 DIAGNOSIS — D509 Iron deficiency anemia, unspecified: Secondary | ICD-10-CM

## 2023-11-15 DIAGNOSIS — E785 Hyperlipidemia, unspecified: Secondary | ICD-10-CM

## 2023-11-15 DIAGNOSIS — M549 Dorsalgia, unspecified: Secondary | ICD-10-CM

## 2023-11-15 DIAGNOSIS — I10 Essential (primary) hypertension: Secondary | ICD-10-CM

## 2023-11-15 DIAGNOSIS — K810 Acute cholecystitis: Secondary | ICD-10-CM

## 2023-11-15 DIAGNOSIS — F03918 Unspecified dementia, unspecified severity, with other behavioral disturbance: Secondary | ICD-10-CM

## 2023-11-15 LAB — BASIC METABOLIC PANEL WITH GFR
Anion gap: 10 (ref 5–15)
BUN: 17 mg/dL (ref 8–23)
CO2: 22 mmol/L (ref 22–32)
Calcium: 8.7 mg/dL — ABNORMAL LOW (ref 8.9–10.3)
Chloride: 107 mmol/L (ref 98–111)
Creatinine, Ser: 1.18 mg/dL — ABNORMAL HIGH (ref 0.44–1.00)
GFR, Estimated: 46 mL/min — ABNORMAL LOW (ref >60)
Glucose, Bld: 92 mg/dL (ref 70–99)
Potassium: 4.4 mmol/L (ref 3.5–5.1)
Sodium: 139 mmol/L (ref 135–145)

## 2023-11-15 LAB — CBC
HCT: 30.4 % — ABNORMAL LOW (ref 36.0–46.0)
Hemoglobin: 9.6 g/dL — ABNORMAL LOW (ref 12.0–15.0)
MCH: 24.6 pg — ABNORMAL LOW (ref 26.0–34.0)
MCHC: 31.6 g/dL (ref 30.0–36.0)
MCV: 77.7 fL — ABNORMAL LOW (ref 80.0–100.0)
Platelets: 284 10*3/uL (ref 150–400)
RBC: 3.91 MIL/uL (ref 3.87–5.11)
RDW: 17.2 % — ABNORMAL HIGH (ref 11.5–15.5)
WBC: 7.8 10*3/uL (ref 4.0–10.5)
nRBC: 0.3 % — ABNORMAL HIGH (ref 0.0–0.2)

## 2023-11-15 LAB — VITAMIN B12: Vitamin B-12: 372 pg/mL (ref 180–914)

## 2023-11-15 MED ORDER — SODIUM CHLORIDE 0.9 % IV SOLN
200.0000 mg | Freq: Once | INTRAVENOUS | Status: AC
  Administered 2023-11-15: 200 mg via INTRAVENOUS
  Filled 2023-11-15: qty 200

## 2023-11-15 MED ORDER — IRON SUCROSE 20 MG/ML IV SOLN: 100.0000 mg | Freq: Once | INTRAVENOUS | Status: DC

## 2023-11-15 MED ORDER — BOOST / RESOURCE BREEZE PO LIQD CUSTOM
1.0000 | Freq: Three times a day (TID) | ORAL | Status: DC
  Administered 2023-11-15 – 2023-11-17 (×4): 1 via ORAL

## 2023-11-15 MED ORDER — PREDNISONE 20 MG PO TABS
40.0000 mg | ORAL_TABLET | Freq: Every day | ORAL | Status: DC
  Administered 2023-11-15 – 2023-11-17 (×3): 40 mg via ORAL
  Filled 2023-11-15 (×3): qty 2

## 2023-11-15 MED ORDER — PANTOPRAZOLE SODIUM 40 MG IV SOLR
40.0000 mg | Freq: Two times a day (BID) | INTRAVENOUS | Status: DC
  Administered 2023-11-15 – 2023-11-17 (×4): 40 mg via INTRAVENOUS
  Filled 2023-11-15 (×5): qty 10

## 2023-11-15 NOTE — ED Notes (Signed)
 Pt ambulated with walker to bathroom for BM , NAD, new brief placed on pt and reattached to purewick at this time

## 2023-11-15 NOTE — ED Notes (Signed)
 Sent via secure chat to A Dezii DO "I just wanted to mention that pt is tachy RR at 29 and wheezing. I gave her albuterol which did not seem to do much. She does not feel shob and is 98% right now on RA. She says she is used to wheezing etc due to the asthma she has. She also says providers came around and listened to her."  Ordered to continue with Albuterol as needed and Prednisone.

## 2023-11-15 NOTE — Consult Note (Signed)
 [facility] CONSULT NOTE  Patient Care Team: Donita Brooks, MD as PCP - General (Family Medicine) Delma Freeze, FNP as Nurse Practitioner (Family Medicine) Creig Hines, NP as Nurse Practitioner (Cardiology) Erroll Luna, [facility] (Inactive) as Pharmacist (Pharmacist)  CHIEF COMPLAINTS/PURPOSE OF CONSULTATION: ANEMIA  HEMATOLOGY HISTORY  # ANEMIA[Hb; MCV-platelets- WBC; Iron sat; ferritin;  GFR- CT/US-   HISTORY OF PRESENTING ILLNESS:  Virginia Walters 83 y.o.  female pleasant patient is  been referred to [place] for further evaluation of anemia.  Patient on admission noted to have hemoglobin of 5.1.  Patient s/p units of PRBC transfusion hemoglobin improved to 9.  Of note patient had similar episode of severe anemia in 2021-an EGD and colonoscopy was done.  Patient was noted to have AVMs at the time.   Patient complains of shortness of breath with exertion.  Also complains of excessive fatigue.  Complains of dizziness especially on standing.    Blood in stools: none EGD/colonoscopy- > 5 years ago.  Blood in urine: none  Difficulty swallowing: none  Change of bowel movement/constipation: none  Prior blood transfusion: yes. Alcohol: once month wine.  Bariatric surgery:  none    Vaginal bleeding:  none  Prior evaluation with hematology: none  Prior bone marrow biopsy:  none  Oral iron:  none  Prior IV iron infusions: none    Review of Systems  Constitutional:  Positive for malaise/fatigue. Negative for chills, diaphoresis and fever.  HENT:  Negative for nosebleeds and sore throat.   Eyes:  Negative for double vision.  Respiratory:  Negative for cough, hemoptysis, sputum production, shortness of breath and wheezing.   Cardiovascular:  Negative for chest pain, palpitations, orthopnea and leg swelling.  Gastrointestinal:  Negative for abdominal pain, blood in stool, constipation, diarrhea, heartburn, melena, nausea and vomiting.  Genitourinary:   Negative for dysuria, frequency and urgency.  Musculoskeletal:  Negative for back pain and joint pain.  Skin: Negative.  Negative for itching and rash.  Neurological:  Positive for dizziness and headaches. Negative for tingling, focal weakness and weakness.  Endo/Heme/Allergies:  Does not bruise/bleed easily.  Psychiatric/Behavioral:  Negative for depression. The patient is not nervous/anxious and does not have insomnia.      MEDICAL HISTORY:  Past Medical History:  Diagnosis Date   (HFpEF) heart failure with preserved ejection fraction (HCC)    a.) TTE 07/22/2018: EF 45-50%; mild LVH; mild diffuse HK; mild LA dilation; PASP 30-35 mmHg; G1DD.   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   Weakness     SURGICAL HISTORY: Past Surgical History:  Procedure Laterality Date   ANKLE ARTHROSCOPY WITH OPEN REDUCTION INTERNAL FIXATION (ORIF) Right    APPENDECTOMY     CHOLECYSTECTOMY     COLONOSCOPY N/A 07/21/2018   Procedure: COLONOSCOPY;  Surgeon: Toney Reil, MD;  Location:  [facility] ENDOSCOPY;  Service: Gastroenterology;  Laterality: N/A;   COLONOSCOPY WITH PROPOFOL N/A 04/01/2020   Procedure: COLONOSCOPY WITH PROPOFOL;  Surgeon: Pasty Spillers, MD;  Location: [facility] ENDOSCOPY;  Service: Endoscopy;  Laterality: N/A;   ESOPHAGOGASTRODUODENOSCOPY N/A 07/21/2018   Procedure: ESOPHAGOGASTRODUODENOSCOPY (EGD);  Surgeon: Toney Reil, MD;  Location[facility] ENDOSCOPY;  Service: Gastroenterology;  Laterality: N/A;   ESOPHAGOGASTRODUODENOSCOPY N/A 02/11/2020   Procedure: ESOPHAGOGASTRODUODENOSCOPY (EGD);  Surgeon: Pasty Spillers, MD;  Location: [facility] ENDOSCOPY;  Service: Endoscopy;  Laterality: N/A;   ESOPHAGOGASTRODUODENOSCOPY (EGD) WITH PROPOFOL N/A 04/01/2020   Procedure: ESOPHAGOGASTRODUODENOSCOPY (EGD) WITH PROPOFOL;  Surgeon: Pasty Spillers, MD;  Location: [facility] ENDOSCOPY;  Service: Endoscopy;  Laterality: N/A;   FRACTURE SURGERY Left    leg fx   IR EXCHANGE BILIARY DRAIN  10/22/2021   IR PERC CHOLECYSTOSTOMY  09/17/2021   TUBAL LIGATION      SOCIAL HISTORY: Social History   Socioeconomic History   Marital status: Widowed    Spouse name: Not on file   Number of children: 1   Years of education: HS   Highest education level: Not on file  Occupational History   Occupation: Retired  Tobacco Use   Smoking status: Former    Current packs/day: 0.00    Average packs/day: 1 pack/day for 15.0 years (15.0 ttl pk-yrs)    Types: Cigarettes    Start date: 12/27/1965    Quit date: 12/27/1980    Years since quitting: 42.9   Smokeless tobacco: Never  Vaping Use   Vaping status: Never Used  Substance and Sexual Activity   Alcohol use: Yes    Alcohol/week: 1.0 standard drink of alcohol    Types: 1 Glasses of wine per week   Drug use: No   Sexual activity: Not Currently  Other Topics Concern   Not on file  Social History Narrative   Lives at home alone. Husband passed away 11-19-12.   Right-handed.   No daily use of caffeine.   Daughter lives in  [place].   Social Drivers of Corporate investment banker Strain: Low Risk  (05/25/2022)   Overall Financial Resource Strain (CARDIA)    Difficulty of Paying Living Expenses: Not hard at all  Food Insecurity: No Food Insecurity (11/15/2023)   Hunger Vital Sign    Worried About Running Out of Food in the Last Year: Never true    Ran Out of Food in the Last Year: Never true  Transportation Needs: No Transportation Needs (11/15/2023)   PRAPARE - Administrator, Civil Service (Medical): No    Lack of Transportation (Non-Medical): No  Physical Activity: Inactive (05/25/2022)   Exercise Vital Sign    Days of Exercise per Week: 0 days    Minutes of Exercise per Session: 0 min  Stress: No Stress Concern Present (05/25/2022)   [employer] of Occupational Health - Occupational Stress Questionnaire    Feeling of Stress : Not at all  Social Connections: Moderately Integrated (11/15/2023)   Social Connection and Isolation Panel [NHANES]    Frequency of Communication  with Friends and Family: More than three times a week    Frequency of Social Gatherings with Friends and Family: More than three times a week    Attends Religious Services: More than 4 times per year    Active Member of [employer] or Organizations: Yes    Attends Banker Meetings: More than 4 times per year    Marital Status: Widowed  Intimate Partner Violence: Not At Risk (11/15/2023)   Humiliation, Afraid, Rape, and Kick questionnaire    Fear of Current or Ex-Partner: No    Emotionally Abused: No    Physically Abused: No    Sexually Abused: No    FAMILY HISTORY: Family History  Problem Relation Age of Onset   Esophageal cancer Father    Dementia Mother     ALLERGIES:  is allergic to ibuprofen and oxycodone.  MEDICATIONS:  Current Facility-Administered Medications  Medication Dose Route Frequency Provider Last Rate Last Admin   0.9 %  sodium chloride infusion  10 mL/hr Intravenous Once Cox, Amy N, DO        acetaminophen (TYLENOL) tablet 650 mg  650 mg Oral Q6H PRN Cox, Amy N, DO       Or   acetaminophen (TYLENOL) suppository 650 mg  650 mg Rectal Q6H PRN Cox, Amy N, DO       albuterol (PROVENTIL) (2.5 MG/3ML) 0.083% nebulizer solution 2.5 mg  2.5 mg Nebulization Q6H PRN Cox, Amy N, DO   2.5 mg at 11/15/23 1732   ascorbic acid (VITAMIN C) tablet 500 mg  500 mg Oral Daily Cox, Amy N, DO   500 mg at 11/15/23 0936   dorzolamide (TRUSOPT) 2 % ophthalmic solution 1 drop  1 drop Left Eye BID Cox, Amy N, DO   1 drop at 11/15/23 2044   doxazosin (CARDURA) tablet 2 mg  2 mg Oral Daily Cox, Amy N, DO   2 mg at 11/15/23 4098   feeding supplement (BOOST / RESOURCE BREEZE) liquid 1 Container  1 Container Oral TID BM Dezii, Alexandra, DO   1 Container at 11/15/23 1203   lidocaine (LIDODERM) 5 % 1-2 patch  1-2 patch Transdermal Daily PRN Cox, Amy N, DO       lisinopril (ZESTRIL) tablet 40 mg  40 mg Oral Daily Cox, Amy N, DO   40 mg at 11/15/23 1191   multivitamin with minerals tablet 1 tablet  1 tablet Oral Daily Cox, Amy N, DO   1 tablet at 11/15/23 4782   omega-3 acid ethyl esters (LOVAZA) capsule 1 g  1 g Oral Daily Effie Shy, RPH   1 g at 11/15/23 0937   ondansetron (ZOFRAN) tablet 4 mg  4 mg Oral Q6H PRN Cox, Amy N, DO       Or   ondansetron (ZOFRAN) injection 4 mg  4 mg Intravenous Q6H PRN Cox, Amy N, DO       pantoprazole (PROTONIX) injection 40 mg  40 mg Intravenous BID Mila Merry A, RPH   40 mg at 11/15/23 1724   pravastatin (PRAVACHOL) tablet 40 mg  40 mg Oral Daily Cox, Amy N, DO   40 mg at 11/15/23 9562   predniSONE (DELTASONE) tablet 40 mg  40 mg Oral Daily Dezii, Alexandra, DO   40 mg at 11/15/23 1822   traZODone (DESYREL) tablet 50 mg  50 mg Oral QHS Cox, Amy N, DO   50 mg at 11/15/23 2043   Current Outpatient Medications  Medication Sig Dispense  Refill   Ascorbic Acid (VITAMIN C PO) Take 1 tablet by mouth daily.     dorzolamide (TRUSOPT) 2 % ophthalmic solution Place 1 drop into  the left eye 2 (two) times daily.     doxazosin (CARDURA) 4 MG tablet TAKE ONE-HALF TABLET BY MOUTH  DAILY 50 tablet 2   fish oil-omega-3 fatty acids 1000 MG capsule Take 1,000 mg by mouth daily.     lisinopril (ZESTRIL) 40 MG tablet TAKE 1 TABLET BY MOUTH DAILY 100 tablet 2   Multiple Vitamin (MULTIVITAMIN) tablet Take 1 tablet by mouth daily.     pravastatin (PRAVACHOL) 40 MG tablet Take 1 tablet (40 mg total) by mouth daily. 09/29/23-PT NEEDS APPOINTMENT 30 tablet 0   traZODone (DESYREL) 50 MG tablet TAKE 1 TABLET BY MOUTH AT  BEDTIME 90 tablet 3   BREZTRI AEROSPHERE 160-9-4.8 MCG/ACT AERO USE 2 INHALATIONS BY MOUTH INTO  THE LUNGS IN THE MORNING AND AT  BEDTIME (Patient not taking: Reported on 11/14/2023) 32.1 g 3   feeding supplement (ENSURE SURGERY) LIQD Take 237 mLs by mouth 2 (two) times daily between meals.     meloxicam (MOBIC) 15 MG tablet Take 1 tablet (15 mg total) by mouth daily. (Patient not taking: Reported on 11/14/2023) 30 tablet 3   solifenacin (VESICARE) 10 MG tablet Take 1 tablet (10 mg total) by mouth daily. (Patient not taking: Reported on 11/14/2023) 90 tablet 3     PHYSICAL EXAMINATION:   Vitals:   11/15/23 1900 11/15/23 2045  BP: (!) 141/63 (!) 169/88  Pulse: 75 72  Resp: (!) 27 20  Temp: 98.8 F (37.1 C) 99.1 F (37.3 C)  SpO2: 93% 93%   Filed Weights   11/14/23 0856  Weight: 227 lb (103 kg)    Physical Exam Vitals and nursing note reviewed.  HENT:     Head: Normocephalic and atraumatic.     Mouth/Throat:     Pharynx: Oropharynx is clear.  Eyes:     Extraocular Movements: Extraocular movements intact.     Pupils: Pupils are equal, round, and reactive to light.  Cardiovascular:     Rate and Rhythm: Normal rate and regular rhythm.  Pulmonary:     Comments: Decreased breath sounds bilaterally.  Abdominal:     Palpations: Abdomen is soft.  Musculoskeletal:        General: Normal range of motion.     Cervical back: Normal range of motion.  Skin:     General: Skin is warm.  Neurological:     General: No focal deficit present.     Mental Status: She is alert and oriented to person, place, and time.  Psychiatric:        Behavior: Behavior normal.        Judgment: Judgment normal.      LABORATORY DATA:  I have reviewed the data as listed Lab Results  Component Value Date   WBC 7.8 11/15/2023   HGB 9.6 (L) 11/15/2023   HCT 30.4 (L) 11/15/2023   MCV 77.7 (L) 11/15/2023   PLT 284 11/15/2023   Recent Labs    11/13/23 0939 11/14/23 0858 11/15/23 0445  NA 143 140 139  K 4.4 4.1 4.4  CL 110 109 107  CO2 24 22 22   GLUCOSE 103* 102* 92  BUN 18 19 17   CREATININE 1.12* 1.25* 1.18*  CALCIUM 9.6 8.8* 8.7*  GFRNONAA  --  43* 46*  PROT 6.3 6.4*  --   ALBUMIN  --  3.6  --  AST 11 19  --   ALT 14 20  --   ALKPHOS  --  47  --   BILITOT 0.3 0.4  --      No results found.  ASSESSMENT & PLAN:   83 year old female patient with multiple medical problems currently admitted to hospital for severe anemia  # Severe anemia hemoglobin 5 seconds iron deficiency from to GI blood loss/AVMs.  Currently s/p PRBC transfusion hemoglobin around 9.  Suspect chronic GI blood loss.  # Multiple comorbidities COPD/CHF  # Chronic back pain  Recommendations/plan:  # Agree with PRBC transfusion.  Since hemoglobin stable on 9-patient is likely stable from hematology standpoint to be discharged.  Agree with IV iron infusion.  # Currently status post evaluation with GI-agree with proceeding with EGD colonoscopy outpatient with no active bleeding.  Thank you Dr. Rennis Chris for allowing me to participate in the care of your pleasant patient. Please do not hesitate to contact me with questions or concerns in the interim.  Discussed with Dr. Rennis Chris.  Will make outpatient appointment for the patient for iron infusions next week.  All questions were answered. The patient knows to call the clinic with any problems, questions or concerns.    Earna Coder, MD 11/15/2023 10:38 PM

## 2023-11-15 NOTE — Consult Note (Signed)
 Cardiology Consultation:   Patient ID: [name]; [id number]; 11/18/1940   Admit date: 11/14/2023 Date of Consult: 11/15/2023  Primary Care Provider: Donita Brooks, MD Primary Cardiologist: Mariah Milling Primary Electrophysiologist:  None   Patient Profile:   Virginia Walters is a 83 y.o. 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 Rennis Chris.  History of Present Illness:   [name] underwent echo in 07/2018 that showed an EF of 45 to 50%, mild, mildly dilated LV cavity size, mild diffuse hypokinesis, grade 1 diastolic dysfunction, calcified mitral annulus with mild regurgitation, mildly dilated left atrium, normal RV systolic function and ventricular cavity size with elevated RVSP estimated at 30 to 35 mmHg, and a small pericardial effusion identified posterior to the heart.  Limited echo in 07/2018 again demonstrated an EF of 45 to 50% with mild LVH and a trivial pericardial effusion that was stable to decreased when compared to prior study.  Lexiscan MPI in 10/2018 showed no significant ischemia or scar with preserved LV systolic function of 61% and was overall low risk.  CT attenuation corrected imaging notable for coronary artery calcification and aortic atherosclerosis.  She was admitted to the hospital in 2021 with symptomatic anemia with a hemoglobin of 6.1.  She denied symptoms of bleeding or change in bowel habits.  She was treated with 2 units PRBC.  EGD showed multiple nonbleeding gastric ulcerations.  She was most recently seen in our office as a new patient in 10/2021 for preoperative cardiac risk  stratification for cholecystectomy and was without symptoms of angina or cardiac decompensation.  No further testing was pursued at that time.  She was seen at her PCPs office on 11/13/2023 reporting chronic low right-sided back pain without sciatica with recommendation to initiate meloxicam.  To ensure NSAID use was appropriate and she underwent routine labs including CBC and CMP.  Hemoglobin came back low at 5.1 with baseline between 10 and 12.  In this setting she was advised to go to the ED.  She was admitted to [facility] on 11/14/2023 with symptomatic anemia.  She denies chest pain, dyspnea, presyncope, or syncope.  She reports intermittent dizziness that began on 11/08/2023.  Dizziness was not related to positional changes.  No associated palpitations.  On arrival to [facility] anemia was confirmed with repeat CBC demonstrating a hemoglobin of 5.0.  She was treated with 2 units PRBC and IV iron with hemoglobin trending to 7.7 with a current value of 9.6.  She denies any symptoms concerning for bleeding including hematochezia or melena.  Remaining labs notable for ferritin of 5 trending to 2, iron 9, TIBC 477, folate greater than 40, serum creatinine 1.12 upon admission with a current value of 1.18.  She was evaluated by hematology and GI who plans to proceed with outpatient EGD and colonoscopy on 11/20/2023.  Cardiology is consulted for preprocedure cardiac risk stratification.  At time of cardiology consult the patient is without symptoms of angina or cardiac decompensation.  She is sedentary at baseline spending the majority of her time watching TV and petting her cat.  She does not do household chores or work out  in the yard.  She indicates she is largely limited by chronic low back pain.  She reports that she would get short of breath if she has to walk approximately 100 yards, though indicates that she would not have chest pain with this.  When shopping in the store she uses a motorized cart.  No lower extremity  swelling, abdominal distention, or progressive orthopnea.  She reports that she feels back to baseline following iron infusion and PRBC.    Past Medical History:  Diagnosis Date   (HFpEF) heart failure with preserved ejection fraction (HCC)    a.) TTE 07/22/2018: EF 45-50%; mild LVH; mild diffuse HK; mild LA dilation; PASP 30-35 mmHg; G1DD.   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   Weakness     Past Surgical History:  Procedure Laterality Date   ANKLE ARTHROSCOPY WITH OPEN REDUCTION INTERNAL FIXATION (ORIF) Right    APPENDECTOMY     CHOLECYSTECTOMY     COLONOSCOPY N/A 07/21/2018   Procedure: COLONOSCOPY;  Surgeon: Toney Reil, MD;  Location: [facility] ENDOSCOPY;  Service: Gastroenterology;  Laterality: N/A;   COLONOSCOPY WITH PROPOFOL N/A 04/01/2020   Procedure: COLONOSCOPY WITH PROPOFOL;  Surgeon: Pasty Spillers, MD;  Location: [facility] ENDOSCOPY;  Service: Endoscopy;  Laterality: N/A;   ESOPHAGOGASTRODUODENOSCOPY N/A 07/21/2018   Procedure:  ESOPHAGOGASTRODUODENOSCOPY (EGD);  Surgeon: Toney Reil, MD;  Location: [facility] ENDOSCOPY;  Service: Gastroenterology;  Laterality: N/A;   ESOPHAGOGASTRODUODENOSCOPY N/A 02/11/2020   Procedure: ESOPHAGOGASTRODUODENOSCOPY (EGD);  Surgeon: Pasty Spillers, MD;  Location: [facility] ENDOSCOPY;  Service: Endoscopy;  Laterality: N/A;   ESOPHAGOGASTRODUODENOSCOPY (EGD) WITH PROPOFOL N/A 04/01/2020   Procedure: ESOPHAGOGASTRODUODENOSCOPY (EGD) WITH PROPOFOL;  Surgeon: Pasty Spillers, MD;  Location: [facility] ENDOSCOPY;  Service: Endoscopy;  Laterality: N/A;   FRACTURE SURGERY Left    leg fx   IR EXCHANGE BILIARY DRAIN  10/22/2021   IR PERC CHOLECYSTOSTOMY  09/17/2021   TUBAL LIGATION       Home Meds: Prior to Admission medications   Medication Sig Start Date End Date Taking? Authorizing Provider  Ascorbic Acid (VITAMIN C PO) Take 1 tablet by mouth daily.   Yes [provider]  dorzolamide (TRUSOPT) 2 % ophthalmic solution Place 1 drop into the left eye 2 (two) times daily.   Yes [provider]  doxazosin (CARDURA) 4 MG tablet TAKE ONE-HALF TABLET BY MOUTH  DAILY 10/04/23  Yes Donita Brooks, MD  fish oil-omega-3 fatty acids 1000 MG capsule Take 1,000 mg by mouth daily.   Yes [provider]  lisinopril (ZESTRIL) 40 MG tablet TAKE 1 TABLET BY MOUTH DAILY 10/04/23  Yes Donita Brooks, MD  Multiple Vitamin (MULTIVITAMIN) tablet Take 1 tablet by mouth daily.   Yes [provider]  pravastatin (PRAVACHOL) 40 MG tablet Take 1 tablet (40 mg total) by mouth daily. 09/29/23-PT NEEDS APPOINTMENT 09/29/23  Yes Donita Brooks, MD  traZODone (DESYREL) 50 MG tablet TAKE 1 TABLET BY MOUTH AT  BEDTIME 10/20/23  Yes Donita Brooks, MD  BREZTRI AEROSPHERE 160-9-4.8 MCG/ACT AERO USE 2 INHALATIONS BY MOUTH INTO  THE LUNGS IN THE MORNING AND AT  BEDTIME Patient not taking: Reported on 11/14/2023 12/07/21   Donita Brooks, MD  feeding supplement (ENSURE SURGERY) LIQD Take  237 mLs by mouth 2 (two) times daily between meals. 09/21/21   Narda Bonds, MD  meloxicam (MOBIC) 15 MG tablet Take 1 tablet (15 mg total) by mouth daily. Patient not taking: Reported on 11/14/2023 11/13/23   Donita Brooks, MD  solifenacin (VESICARE) 10 MG tablet Take 1 tablet (10 mg total) by mouth daily. Patient not taking: Reported on 11/14/2023 11/13/23   Donita Brooks, MD    Inpatient Medications: Scheduled Meds:  ascorbic acid  500 mg Oral Daily   dorzolamide  1 drop Left Eye BID   doxazosin  2 mg Oral Daily   feeding supplement  1 Container Oral TID BM   lisinopril  40 mg Oral Daily   multivitamin with minerals  1 tablet Oral Daily   omega-3 acid ethyl esters  1 g Oral Daily   pravastatin  40 mg Oral Daily   traZODone  50 mg Oral QHS   Continuous Infusions:  sodium chloride     PRN Meds: acetaminophen **OR** acetaminophen, albuterol, lidocaine, ondansetron **OR** ondansetron (ZOFRAN) IV  Allergies:   Allergies  Allergen Reactions   Ibuprofen    Oxycodone Itching    Social History:   Social History   Socioeconomic History   Marital status: Widowed    Spouse name: Not on file   Number of children: 1   Years of education: HS   Highest education level: Not on file  Occupational History   Occupation: Retired  Tobacco Use   Smoking status: Former    Current packs/day: 0.00    Average packs/day: 1 pack/day for 15.0 years (15.0 ttl pk-yrs)    Types: Cigarettes    Start date: 12/27/1965    Quit date: 12/27/1980    Years since quitting: 42.9   Smokeless tobacco: Never  Vaping Use   Vaping status: Never Used  Substance and Sexual Activity   Alcohol use: Yes    Alcohol/week: 1.0 standard drink of alcohol    Types: 1 Glasses of wine per week   Drug use: No   Sexual activity: Not Currently  Other Topics Concern   Not on file  Social History Narrative   Lives at home alone. Husband passed away 11-30-2012.   Right-handed.   No daily use of caffeine.   Daughter  lives in [place].   Social Drivers of Corporate investment banker Strain: Low Risk  (05/25/2022)   Overall Financial Resource Strain (CARDIA)    Difficulty of Paying Living Expenses: Not hard at all  Food Insecurity: No Food Insecurity (11/15/2023)   Hunger Vital Sign    Worried About Running Out of Food in the Last Year:  Never true    Ran Out of Food in the Last Year: Never true  Transportation Needs: No Transportation Needs (11/15/2023)   PRAPARE - Administrator, Civil Service (Medical): No    Lack of Transportation (Non-Medical): No  Physical Activity: Inactive (05/25/2022)   Exercise Vital Sign    Days of Exercise per Week: 0 days    Minutes of Exercise per Session: 0 min  Stress: No Stress Concern Present (05/25/2022)   [employer] of Occupational Health - Occupational Stress Questionnaire    Feeling of Stress : Not at all  Social Connections: Moderately Integrated (11/15/2023)   Social Connection and Isolation Panel [NHANES]    Frequency of Communication with Friends and Family: More than three times a week    Frequency of Social Gatherings with Friends and Family: More than three times a week    Attends Religious Services: More than 4 times per year    Active Member of [employer] or Organizations: Yes    Attends Banker Meetings: More than 4 times per year    Marital Status: Widowed  Intimate Partner Violence: Not At Risk (11/15/2023)   Humiliation, Afraid, Rape, and Kick questionnaire    Fear of Current or Ex-Partner: No    Emotionally Abused: No    Physically Abused: No    Sexually Abused: No     Family History:   Family History  Problem Relation Age of Onset   Esophageal cancer Father    Dementia Mother     ROS:  Review of Systems  Constitutional:  Positive for malaise/fatigue. Negative for chills, diaphoresis, fever and weight loss.  HENT:  Negative for congestion.   Eyes:  Negative for discharge and redness.  Respiratory:  Negative for  cough, hemoptysis, sputum production, shortness of breath and wheezing.   Cardiovascular:  Negative for chest pain, palpitations, orthopnea, claudication, leg swelling and PND.  Gastrointestinal:  Negative for abdominal pain, blood in stool, heartburn, melena, nausea and vomiting.  Genitourinary:  Negative for hematuria.  Musculoskeletal:  Negative for falls and myalgias.  Skin:  Negative for rash.  Neurological:  Positive for dizziness and weakness. Negative for tingling, tremors, sensory change, speech change, focal weakness and loss of consciousness.  Endo/Heme/Allergies:  Does not bruise/bleed easily.  Psychiatric/Behavioral:  Negative for substance abuse. The patient is not nervous/anxious.   All other systems reviewed and are negative.     Physical Exam/Data:   Vitals:   11/15/23 1031 11/15/23 1300 11/15/23 1435 11/15/23 1600  BP:  (!) 170/64  137/79  Pulse:  79  73  Resp:  (!) 30  (!) 25  Temp: 97.9 F (36.6 C)  98.1 F (36.7 C)   TempSrc: Oral  Oral   SpO2:  100%  94%  Weight:      Height:       No intake or output data in the 24 hours ending 11/15/23 1625  Filed Weights   11/14/23 0856  Weight: 103 kg   Body mass index is 35.55 kg/m.   Physical Exam: General: Well developed, well nourished, in no acute distress. Head: Normocephalic, atraumatic, sclera non-icteric, no xanthomas, nares without discharge.  Neck: Negative for carotid bruits. JVD not elevated. Lungs: Diminished breath sounds bilaterally with diffuse expiratory wheezing.  Breathing is unlabored. Heart: RRR with S1 S2. No murmurs, rubs, or gallops appreciated. Abdomen: Soft, non-tender, non-distended with normoactive bowel sounds. No hepatomegaly. No rebound/guarding. No obvious abdominal masses. Msk:  Strength and tone appear normal  for age. Extremities: No clubbing or cyanosis. No edema. Distal pedal pulses are 2+ and equal bilaterally. Neuro: Alert and oriented X 3. No facial asymmetry. No focal  deficit. Moves all extremities spontaneously. Psych:  Responds to questions appropriately with a normal affect.   EKG:  The EKG was personally reviewed and demonstrates: Pending Telemetry:  Telemetry was personally reviewed and demonstrates: Sinus rhythm with artifact  Weights: Filed Weights   11/14/23 0856  Weight: 103 kg    Relevant CV Studies:  Lexiscan MPI 10/19/2018: Normal pharmacologic myocardial perfusion stress test without significant ischemia or scar. The left ventricular ejection fraction is normal by visual estimation and Siemens calculation(61%). Coronary artery and aortic atherosclerotic calcifications are noted. Attenuation correction CT is notable for a hiatal hernia and subcentemeter nodules in the right lung, incomplete characterized. Findings better characterized on dedicated chest CT from 08/14/2018. This is a low risk study. __________  Limited echo 07/31/2018: - Limited study to assess pericardial effusion.  - Left ventricle: The cavity size was at the upper limits of    normal. Wall thickness was increased in a pattern of mild LVH.    Systolic function was mildly reduced. The estimated ejection    fraction was in the range of 45% to 50%. Diffuse hypokinesis.  - Pericardium, extracardiac: A trivial pericardial effusion was    identified. Effusion is stable to decreased in size compared with    prior echo from 07/22/18.  __________  2D echo 07/22/2018: - Left ventricle: The cavity size was mildly dilated. Wall    thickness was increased in a pattern of mild LVH. Systolic    function was mildly reduced. The estimated ejection fraction was    in the range of 45% to 50%. Mild diffuse hypokinesis. Regional    wall motion abnormalities cannot be excluded. Doppler parameters    are consistent with abnormal left ventricular relaxation (grade 1    diastolic dysfunction). Doppler parameters are consistent with    high ventricular filling pressure.  - Mitral valve:  Mildly calcified annulus. Mildly thickened leaflets    . There was mild regurgitation.  - Left atrium: The atrium was mildly dilated.  - Right ventricle: The cavity size was normal. Wall thickness was    mildly increased. Systolic function was normal.  - Pulmonary arteries: Systolic pressure was mildly increased, in    the range of 30 mm Hg to 35 mm Hg plus central venous/right    atrial pressure.  - Pericardium, extracardiac: A small pericardial effusion was    identified posterior to the heart.    Laboratory Data:  Chemistry Recent Labs  Lab 11/13/23 0939 11/14/23 0858 11/15/23 0445  NA 143 140 139  K 4.4 4.1 4.4  CL 110 109 107  CO2 24 22 22   GLUCOSE 103* 102* 92  BUN 18 19 17   CREATININE 1.12* 1.25* 1.18*  CALCIUM 9.6 8.8* 8.7*  GFRNONAA  --  43* 46*  ANIONGAP  --  9 10    Recent Labs  Lab 11/13/23 0939 11/14/23 0858  PROT 6.3 6.4*  ALBUMIN  --  3.6  AST 11 19  ALT 14 20  ALKPHOS  --  47  BILITOT 0.3 0.4   Hematology Recent Labs  Lab 11/13/23 0939 11/14/23 0858 11/14/23 2230 11/15/23 0445  WBC 5.7 6.1  --  7.8  RBC 2.34* 2.22*  2.21*  --  3.91  HGB 5.1* 5.0* 7.7* 9.6*  HCT 18.9* 17.5* 24.7* 30.4*  MCV 80.8 78.8*  --  77.7*  MCH 21.8* 22.5*  --  24.6*  MCHC 27.0* 28.6*  --  31.6  RDW 16.4* 17.6*  --  17.2*  PLT 310 310  --  284   Cardiac EnzymesNo results for input(s): "TROPONINI" in the last 168 hours. No results for input(s): "TROPIPOC" in the last 168 hours.  BNPNo results for input(s): "BNP", "PROBNP" in the last 168 hours.  DDimer No results for input(s): "DDIMER" in the last 168 hours.  Radiology/Studies:  No results found.  Assessment and Plan:   1. Preprocedure cardiac risk stratification: -Patient admitted with symptomatic and transfusion dependent anemia with an initial hemoglobin of 5.1.  She denies symptoms of hematochezia or melena.  She has been evaluated by GI with plans for outpatient EGD and colonoscopy on 11/20/2023.  Cardiology  is consulted for further risk stratification.  Patient is largely sedentary at baseline spending most of her time watching TV and petting her cat.  She indicates she otherwise does not do chores around the house or outdoors.  She is currently without symptoms of angina or cardiac decompensation.  She reports her breathing is at baseline.  Overall, EGD and colonoscopy are not high risk procedures from a cardiac perspective and I believe the benefit of moving forward with these studies given her symptomatic anemia outweigh risks pending EKG review and echo.  If these studies are reassuring she may proceed with noncardiac study without further cardiac testing.  2. HFmrEF: -Appears euvolemic and well compensated from a cardiac perspective.  Suspect diminished breath sounds and wheezing may be more pulmonary in etiology as she indicates she has a history of asthma.  She denies any dyspnea and reports her breathing is at baseline.  She has received 2 units PRBC with recommendation to monitor for TACO.  Assess hemodynamic status on echo.  PTA lisinopril.  Has not been maintained on beta-blocker, pending echo findings would look to escalate GDMT as indicated.  3.  Symptomatic anemia: -Status post 2 units PRBC and IV iron infusion.  Hemoglobin improved from 5.1-9.6.  She denies symptoms of hematochezia or melena.  Management per IM and GI.  4.  Coronary artery calcification: -She is without symptoms of angina.  Prior stress test without evidence of ischemia or infarction and overall low risk.  Obtain EKG.  Not on aspirin as outpatient, defer initiation at this time with transfusion dependent anemia.  On pravastatin as outpatient with ongoing management per PCP.  Outpatient follow-up.  At this time, no plans for inpatient ischemic testing given lack of anginal symptoms and with transfusion dependent anemia.  5.  HTN: -Blood pressure well-controlled currently -PTA lisinopril and Cardura for now      For  questions or updates, please contact [facility] Please consult [email or web address] for contact info under Cardiology/STEMI.   Signed, Eula Listen, PA-C [facility] Pager: [phone] 11/15/2023, 4:25 PM

## 2023-11-15 NOTE — ED Notes (Signed)
 Pt's brief and sheets changed. Pt placed on a PureWick due to limited mobility and urinary incontinence.

## 2023-11-15 NOTE — Consult Note (Signed)
 Virginia Minium, MD [facility]  [address], [address] [place], [place] [address] Phone: [phone] Fax : [phone]  Consultation  Referring Provider:     Dr. Rennis Chris Primary Care Physician:  Donita Brooks, MD Primary Gastroenterologist: [facility]         Reason for Consultation:     Iron deficiency anemia  Date of Admission:  11/14/2023 Date of Consultation:  11/15/2023         HPI:   Virginia Walters is a 83 y.o. female who was admitted with profound anemia.  The patient's blood count on admission was 5.1 on the 31st with rechecking it on April 1 with a repeat being 5.0.  The patient has received 3 units of packed red blood cells and is now at 9.6 today.  The patient's baseline a year ago was 11.8.  She denies any black stools or bloody stools.  She also denies any change in bowel habits or unexplained weight loss.  The patient did have an EGD and colonoscopy for anemia back in 2021 by Dr. Maximino Greenland.  At that time the patient was found to have AVMs.  The colonoscopy also showed 2 polyps that were removed from the cecum with diverticulosis.  The AVMs were treated with APC during the colonoscopy. She denies any abdominal pain or any other GI symptoms at the present time.  She also reports that she has not followed up with GI since that time. The polyps in the colon showed 1 to be a tubular adenoma with the other one being a sessile serrated polyp.  Past Medical History:  Diagnosis Date   (HFpEF) heart failure with preserved ejection fraction (HCC)    a.) TTE 07/22/2018: EF 45-50%; mild LVH; mild diffuse HK; mild LA dilation; PASP 30-35 mmHg; G1DD.   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   Weakness     Past Surgical History:  Procedure Laterality Date   ANKLE ARTHROSCOPY WITH OPEN REDUCTION INTERNAL FIXATION (ORIF) Right    APPENDECTOMY     CHOLECYSTECTOMY     COLONOSCOPY N/A 07/21/2018   Procedure: COLONOSCOPY;  Surgeon: Toney Reil, MD;  Location: [facility] ENDOSCOPY;  Service: Gastroenterology;  Laterality: N/A;   COLONOSCOPY WITH PROPOFOL N/A 04/01/2020   Procedure: COLONOSCOPY WITH PROPOFOL;  Surgeon: Pasty Spillers, MD;  Location: [facility] ENDOSCOPY;  Service: Endoscopy;  Laterality: N/A;   ESOPHAGOGASTRODUODENOSCOPY N/A 07/21/2018   Procedure:  ESOPHAGOGASTRODUODENOSCOPY (EGD);  Surgeon: Toney Reil, MD;  Location: [facility] ENDOSCOPY;  Service: Gastroenterology;  Laterality: N/A;   ESOPHAGOGASTRODUODENOSCOPY N/A 02/11/2020   Procedure: ESOPHAGOGASTRODUODENOSCOPY (EGD);  Surgeon: Pasty Spillers, MD;  Location: [facility] ENDOSCOPY;  Service: Endoscopy;  Laterality: N/A;   ESOPHAGOGASTRODUODENOSCOPY (EGD) WITH PROPOFOL N/A 04/01/2020   Procedure: ESOPHAGOGASTRODUODENOSCOPY (EGD) WITH PROPOFOL;  Surgeon: Pasty Spillers, MD;  Location: [facility] ENDOSCOPY;  Service: Endoscopy;  Laterality: N/A;   FRACTURE SURGERY Left    leg fx   IR EXCHANGE BILIARY DRAIN  10/22/2021   IR PERC CHOLECYSTOSTOMY  09/17/2021   TUBAL LIGATION      Prior to Admission  medications   Medication Sig Start Date End Date Taking? Authorizing Provider  Ascorbic Acid (VITAMIN C PO) Take 1 tablet by mouth daily.   Yes [provider]  dorzolamide (TRUSOPT) 2 % ophthalmic solution Place 1 drop into the left eye 2 (two) times daily.   Yes [provider]  doxazosin (CARDURA) 4 MG tablet TAKE ONE-HALF TABLET BY MOUTH  DAILY 10/04/23  Yes Donita Brooks, MD  fish oil-omega-3 fatty acids 1000 MG capsule Take 1,000 mg by mouth daily.   Yes [provider]  lisinopril (ZESTRIL) 40 MG tablet TAKE 1 TABLET BY MOUTH DAILY 10/04/23  Yes Donita Brooks, MD  Multiple Vitamin (MULTIVITAMIN) tablet Take 1 tablet by mouth daily.   Yes [provider]  pravastatin (PRAVACHOL) 40 MG tablet Take 1 tablet (40 mg total) by mouth daily. 09/29/23-PT NEEDS APPOINTMENT 09/29/23  Yes Donita Brooks, MD  traZODone (DESYREL) 50 MG tablet TAKE 1 TABLET BY MOUTH AT  BEDTIME 10/20/23  Yes Donita Brooks, MD  BREZTRI AEROSPHERE 160-9-4.8 MCG/ACT AERO USE 2 INHALATIONS BY MOUTH INTO  THE LUNGS IN THE MORNING AND AT  BEDTIME Patient not taking: Reported on 11/14/2023 12/07/21   Donita Brooks, MD  feeding supplement (ENSURE SURGERY) LIQD Take 237 mLs by mouth 2 (two) times daily between meals. 09/21/21   Narda Bonds, MD  meloxicam (MOBIC) 15 MG tablet Take 1 tablet (15 mg total) by mouth daily. Patient not taking: Reported on 11/14/2023 11/13/23   Donita Brooks, MD  solifenacin (VESICARE) 10 MG tablet Take 1 tablet (10 mg total) by mouth daily. Patient not taking: Reported on 11/14/2023 11/13/23   Donita Brooks, MD    Family History  Problem Relation Age of Onset   Esophageal cancer Father    Dementia Mother      Social History   Tobacco Use   Smoking status: Former    Current packs/day: 0.00    Average packs/day: 1 pack/day for 15.0 years (15.0 ttl pk-yrs)    Types: Cigarettes    Start date: 12/27/1965    Quit date: 12/27/1980    Years since  quitting: 42.9   Smokeless tobacco: Never  Vaping Use   Vaping status: Never Used  Substance Use Topics   Alcohol use: Yes    Alcohol/week: 1.0 standard drink of alcohol    Types: 1 Glasses of wine per week   Drug use: No    Allergies as of 11/14/2023 - Review Complete 11/14/2023  Allergen Reaction Noted   Ibuprofen  11/14/2023   Oxycodone Itching 12/30/2014    Review of Systems:    All systems reviewed and negative except where noted in HPI.   Physical Exam:  Vital signs in last 24 hours: Temp:  [97.9 F (36.6 C)-98.6 F (37 C)] 97.9 F (36.6 C) (  04/02 1031) Pulse Rate:  [64-91] 79 (04/02 1300) Resp:  [16-31] 30 (04/02 1300) BP: (122-170)/(52-89) 170/64 (04/02 1300) SpO2:  [93 %-100 %] 100 % (04/02 1300)   General:   Pleasant, cooperative in NAD Head:  Normocephalic and atraumatic. Eyes:   No icterus.   Conjunctiva pink. PERRLA. Ears:  Normal auditory acuity. Neck:  Supple; no masses or thyroidomegaly Lungs: Respirations even and unlabored. Lungs clear to auscultation bilaterally.   No wheezes, crackles, or rhonchi.  Heart:  Regular rate and rhythm;  Without murmur, clicks, rubs or gallops Abdomen:  Soft, nondistended, nontender. Normal bowel sounds. No appreciable masses or hepatomegaly.  No rebound or guarding.  Rectal:  Not performed. Msk:  Symmetrical without gross deformities.    Extremities:  Without edema, cyanosis or clubbing. Neurologic:  Alert and oriented x3;  grossly normal neurologically. Skin:  Intact without significant lesions or rashes. Cervical Nodes:  No significant cervical adenopathy. Psych:  Alert and cooperative. Normal affect.  LAB RESULTS: Recent Labs    11/13/23 0939 11/14/23 0858 11/14/23 2230 11/15/23 0445  WBC 5.7 6.1  --  7.8  HGB 5.1* 5.0* 7.7* 9.6*  HCT 18.9* 17.5* 24.7* 30.4*  PLT 310 310  --  284   BMET Recent Labs    11/13/23 0939 11/14/23 0858 11/15/23 0445  NA 143 140 139  K 4.4 4.1 4.4  CL 110 109 107  CO2 24  22 22   GLUCOSE 103* 102* 92  BUN 18 19 17   CREATININE 1.12* 1.25* 1.18*  CALCIUM 9.6 8.8* 8.7*   LFT Recent Labs    11/14/23 0858  PROT 6.4*  ALBUMIN 3.6  AST 19  ALT 20  ALKPHOS 47  BILITOT 0.4   PT/INR No results for input(s): "LABPROT", "INR" in the last 72 hours.  STUDIES: No results found.    Impression / Plan:   Assessment: Principal Problem:   Symptomatic anemia Active Problems:   HTN (hypertension)   Dyslipidemia   GERD (gastroesophageal reflux disease)   Dementia with behavioral problem   Chronic systolic heart failure (HCC)   Acute cholecystitis s/p perc cholecystotomy drain 09/17/2021   Insomnia   COPD (chronic obstructive pulmonary disease) (HCC)   Chronic back pain   Jamesha I Racette is a 83 y.o. y/o female with iron deficient anemia with profound anemia which corrected well with a blood transfusion.  The patient has not followed up with GI since her last EGD and colonoscopy.  It appears the patient has also not undergone a capsule endoscopy.  The patient has requested that she be set up for any procedures as an outpatient since she would like to go home.  Plan:  The patient should undergo a repeat EGD and colonoscopy due to her profound anemia.  She should be treated with iron and if the EGD and colonoscopy are negative she may need a capsule endoscopy.  The patient has agreed to do this as an outpatient.  My office has been notified and we will try to get her on for an EGD and colonoscopy with me next week.  I have spoken to the hospitalist about this and they agree with the plan.  Thank you for involving me in the care of this patient.      LOS: 1 day   Virginia Minium, MD, MD. Clementeen Graham 11/15/2023, 1:30 PM,  Pager [phone] 7am-5pm  Check AMION for 5pm -7am coverage and on weekends   Note: This dictation was prepared with Dragon dictation along with smaller  Lobbyist. Any transcriptional errors that result from this process are unintentional.

## 2023-11-15 NOTE — Telephone Encounter (Signed)
Left message to call back to schedule in office appt for pre op clearance.

## 2023-11-15 NOTE — Telephone Encounter (Signed)
   Name: Virginia Walters  DOB: [birth year]  MRN: [id number]  Primary Cardiologist: None  Chart reviewed as part of pre-operative protocol coverage. Because of Kaytie I Helinski's past medical history and time since last visit, she will require a follow-up in-office visit in order to better assess preoperative cardiovascular risk.  Pre-op covering staff: - Please schedule appointment and call patient to inform them. If patient already had an upcoming appointment within acceptable timeframe, please add "pre-op clearance" to the appointment notes so provider is aware. - Please contact requesting surgeon's office via preferred method (i.e, phone, fax) to inform them of need for appointment prior to surgery.  Denyce Robert, NP  11/15/2023, 2:56 PM

## 2023-11-15 NOTE — Telephone Encounter (Signed)
   Pre-operative Risk Assessment    Patient Name: Virginia Walters  DOB: 03-Dec-1940 MRN: [id number]   Date of last office visit: unknown Date of next office visit: unknown   Request for Surgical Clearance    Procedure:   EGD and colonoscopy  Date of Surgery:  Clearance TBD                                Surgeon:  not indicated Surgeon's Group or Practice Name:  [facility] Phone number:  [phone] Fax number:  [phone]   Type of Clearance Requested:   - Medical    Type of Anesthesia:  General    Additional requests/questions:    SignedShawna Orleans   11/15/2023, 2:15 PM

## 2023-11-15 NOTE — Progress Notes (Signed)
 PROGRESS NOTE    Virginia Walters  UYQ:[id number] DOB: 12-Dec-1940 DOA: 11/14/2023 PCP: Donita Brooks, MD  Chief Complaint  Patient presents with   Abnormal Lab    Hospital Course:  Virginia Walters is 83 y.o. female with hypertension, hyperlipidemia, chronic back pain, neuropathy, presents to the ED due to critically low hemoglobin with PCP.  On arrival to the ED all vital signs are stable.  Creatinine 1.25, WBC 6.1, platelets 310, hemoglobin 5.0.  Anemia panel was ordered by EDP.  Patient was typed and screened for 2 units PRBCs. Patient endorses history of endoscopy and colonoscopy in 2021 which she received at that time for acute anemia.  She reports they were negative for signs of GI bleed at that time.  Subjective: On evaluation this morning patient reports she is feeling a lot better.  Continues to deny any bloody bowel movements or occult blood loss.  She is requesting perform endoscopy and colonoscopy outpatient.   Objective: Vitals:   11/15/23 0256 11/15/23 0624 11/15/23 1000 11/15/23 1031  BP: (!) 153/82 (!) 165/69 (!) 162/89   Pulse: 74 73 69   Resp: (!) 31 (!) 22 16   Temp: 98.2 F (36.8 C) 98.4 F (36.9 C)  97.9 F (36.6 C)  TempSrc: Oral Oral  Oral  SpO2: 95% 96% 100%   Weight:      Height:        Intake/Output Summary (Last 24 hours) at 11/15/2023 1038 Last data filed at 11/14/2023 1624 Gross per 24 hour  Intake 315 ml  Output --  Net 315 ml   Filed Weights   11/14/23 0856  Weight: 103 kg    Examination: General exam: Appears calm and comfortable, NAD  Respiratory system: No work of breathing, symmetric chest wall expansion Cardiovascular system: S1 & S2 heard, RRR.  Gastrointestinal system: Abdomen is nondistended, soft and nontender.  Neuro: Alert and oriented. No focal neurological deficits. Extremities: Symmetric, expected ROM Skin: No rashes, lesions Psychiatry: Demonstrates appropriate judgement and insight. Mood & affect appropriate for  situation.   Assessment & Plan:  Principal Problem:   Symptomatic anemia Active Problems:   HTN (hypertension)   Dyslipidemia   GERD (gastroesophageal reflux disease)   Dementia with behavioral problem   Chronic systolic heart failure (HCC)   Acute cholecystitis s/p perc cholecystotomy drain 09/17/2021   Insomnia   COPD (chronic obstructive pulmonary disease) (HCC)   Chronic back pain   Symptomatic anemia - Iron: 9, TIBC 477, ferritin 2, TSAT 2 --Presumed GI loss though has not yet been found. - Patient has elected to follow-up outpatient for colonoscopy and endoscopy, this has been scheduled for next Monday.  GI has requested cardiology preoperative clearance - Cardiology consulted, EKG unchanged.  Echocardiogram pending to evaluate cardiomyopathy.  Reportedly if echo is unchanged from prior she should proceed with colonoscopy and endoscopy without further concern. - On PPI - Patient was evaluated in June 2021 and had endoscopy and transfusion at that time.  Patient was found to have nonbleeding angioectasias on upper endoscopy as well as a nonbleeding gastric ulcer with clean base. - Hematology has been consulted by admitting MD, will arrange for outpatient IV iron. - Will receive first dose of Venofer while admitted today - Status post 3 units PRBCs - Continue to trend hemoglobin, goal hemoglobin over 8 given history of heart failure and CAD  Chronic back pain - Lidocaine patch, as needed pain meds  COPD, and not currently in exacerbation - Albuterol as  needed  Insomnia - Resume home dose trazodone  Chronic systolic heart failure - Clinically euvolemic - EF 45 to 50% with diffuse hypokinesis - Resume home meds - Repeat echocardiogram ordered for cardiac clearance, ensure cardiomyopathy has not worsened  GERD - On Protonix twice daily  Dyslipidemia - On statin  Hypertension - Resume home meds  DVT prophylaxis: SCDS   Code Status: Full Code Family Communication:   Discussed directly with patient Disposition:  Inpatient still hospitalized for cardiac clearance, will discharge to home if ECHO and Hgb stable in AM  Consultants:  Treatment Team:  Consulting Physician: Earna Coder, MD  Procedures:    Antimicrobials:  Anti-infectives (From admission, onward)    None       Data Reviewed: I have personally reviewed following labs and imaging studies CBC: Recent Labs  Lab 11/13/23 0939 11/14/23 0858 11/14/23 2230 11/15/23 0445  WBC 5.7 6.1  --  7.8  NEUTROABS 3,631 3.8  --   --   HGB 5.1* 5.0* 7.7* 9.6*  HCT 18.9* 17.5* 24.7* 30.4*  MCV 80.8 78.8*  --  77.7*  PLT 310 310  --  284   Basic Metabolic Panel: Recent Labs  Lab 11/13/23 0939 11/14/23 0858 11/15/23 0445  NA 143 140 139  K 4.4 4.1 4.4  CL 110 109 107  CO2 24 22 22   GLUCOSE 103* 102* 92  BUN 18 19 17   CREATININE 1.12* 1.25* 1.18*  CALCIUM 9.6 8.8* 8.7*   GFR: Estimated Creatinine Clearance: 45.4 mL/min (A) (by C-G formula based on SCr of 1.18 mg/dL (H)). Liver Function Tests: Recent Labs  Lab 11/13/23 0939 11/14/23 0858  AST 11 19  ALT 14 20  ALKPHOS  --  47  BILITOT 0.3 0.4  PROT 6.3 6.4*  ALBUMIN  --  3.6   CBG: No results for input(s): "GLUCAP" in the last 168 hours.  No results found for this or any previous visit (from the past 240 hours).   Radiology Studies: No results found.  Scheduled Meds:  ascorbic acid  500 mg Oral Daily   dorzolamide  1 drop Left Eye BID   doxazosin  2 mg Oral Daily   feeding supplement  237 mL Oral BID BM   lisinopril  40 mg Oral Daily   multivitamin with minerals  1 tablet Oral Daily   omega-3 acid ethyl esters  1 g Oral Daily   pravastatin  40 mg Oral Daily   traZODone  50 mg Oral QHS   Continuous Infusions:  sodium chloride       LOS: 1 day  MDM: Patient is high risk for one or more organ failure.  They necessitate ongoing hospitalization for continued IV therapies and subsequent lab monitoring.  Total time spent interpreting labs and vitals, coordinating care amongst consultants and care team members, directly assessing and discussing care with the patient and/or family: 55 min    Virginia Crape, DO [facility]  To contact the attending physician between 7A-7P please use Epic Chat. To contact the covering physician during after hours 7P-7A, please review Amion.   11/15/2023, 10:38 AM   *This document has been created with the assistance of dictation software. Please excuse typographical errors. *

## 2023-11-15 NOTE — Progress Notes (Signed)
 Please schedule follow-up [15 min -hospital follow up] in the next week or so-MD labs CBC; CMP LDH; Venofer- GB

## 2023-11-16 ENCOUNTER — Other Ambulatory Visit: Payer: Self-pay

## 2023-11-16 ENCOUNTER — Other Ambulatory Visit

## 2023-11-16 ENCOUNTER — Inpatient Hospital Stay (HOSPITAL_COMMUNITY)
Admit: 2023-11-16 | Discharge: 2023-11-16 | Disposition: A | Discharge status: Home / Self Care | Attending: Internal Medicine | Admitting: Internal Medicine

## 2023-11-16 ENCOUNTER — Inpatient Hospital Stay

## 2023-11-16 DIAGNOSIS — D649 Anemia, unspecified: Secondary | ICD-10-CM

## 2023-11-16 DIAGNOSIS — Z01810 Encounter for preprocedural cardiovascular examination: Secondary | ICD-10-CM

## 2023-11-16 DIAGNOSIS — M549 Dorsalgia, unspecified: Secondary | ICD-10-CM | POA: Diagnosis not present

## 2023-11-16 DIAGNOSIS — J449 Chronic obstructive pulmonary disease, unspecified: Secondary | ICD-10-CM | POA: Diagnosis not present

## 2023-11-16 DIAGNOSIS — K2100 Gastro-esophageal reflux disease with esophagitis, without bleeding: Secondary | ICD-10-CM | POA: Diagnosis not present

## 2023-11-16 LAB — TYPE AND SCREEN
ABO/RH(D): A POS
Antibody Screen: NEGATIVE
Unit division: 0
Unit division: 0
Unit division: 0

## 2023-11-16 LAB — BPAM RBC
Blood Product Expiration Date: 202504032359
Blood Product Expiration Date: 202504052359
Blood Product Expiration Date: 202504062359
ISSUE DATE / TIME: 202504011240
ISSUE DATE / TIME: 202504011632
ISSUE DATE / TIME: 202504020002
Unit Type and Rh: 600
Unit Type and Rh: 600
Unit Type and Rh: 600

## 2023-11-16 LAB — COMPREHENSIVE METABOLIC PANEL WITH GFR
ALT: 16 U/L (ref 0–44)
AST: 24 U/L (ref 15–41)
Albumin: 3.6 g/dL (ref 3.5–5.0)
Alkaline Phosphatase: 55 U/L (ref 38–126)
Anion gap: 8 (ref 5–15)
BUN: 13 mg/dL (ref 8–23)
CO2: 24 mmol/L (ref 22–32)
Calcium: 8.9 mg/dL (ref 8.9–10.3)
Chloride: 106 mmol/L (ref 98–111)
Creatinine, Ser: 1.03 mg/dL — ABNORMAL HIGH (ref 0.44–1.00)
GFR, Estimated: 54 mL/min — ABNORMAL LOW (ref >60)
Glucose, Bld: 137 mg/dL — ABNORMAL HIGH (ref 70–99)
Potassium: 4.4 mmol/L (ref 3.5–5.1)
Sodium: 138 mmol/L (ref 135–145)
Total Bilirubin: 1 mg/dL (ref 0.0–1.2)
Total Protein: 6.6 g/dL (ref 6.5–8.1)

## 2023-11-16 LAB — ECHOCARDIOGRAM COMPLETE
AR max vel: 2.81 cm2
AV Area VTI: 3.16 cm2
AV Area mean vel: 2.79 cm2
AV Mean grad: 4 mmHg
AV Peak grad: 9.1 mmHg
Ao pk vel: 1.51 m/s
Area-P 1/2: 3.83 cm2
Calc EF: 54.6 %
Height: 67 in
MV VTI: 2.74 cm2
S' Lateral: 3.4 cm
Single Plane A2C EF: 47.2 %
Single Plane A4C EF: 61.8 %
Weight: 3632 [oz_av]

## 2023-11-16 LAB — CBC WITH DIFFERENTIAL/PLATELET
Abs Immature Granulocytes: 0.09 10*3/uL — ABNORMAL HIGH (ref 0.00–0.07)
Basophils Absolute: 0 10*3/uL (ref 0.0–0.1)
Basophils Relative: 0 %
Eosinophils Absolute: 0 10*3/uL (ref 0.0–0.5)
Eosinophils Relative: 0 %
HCT: 28.8 % — ABNORMAL LOW (ref 36.0–46.0)
Hemoglobin: 9.1 g/dL — ABNORMAL LOW (ref 12.0–15.0)
Immature Granulocytes: 1 %
Lymphocytes Relative: 8 %
Lymphs Abs: 0.7 10*3/uL (ref 0.7–4.0)
MCH: 24.7 pg — ABNORMAL LOW (ref 26.0–34.0)
MCHC: 31.6 g/dL (ref 30.0–36.0)
MCV: 78.3 fL — ABNORMAL LOW (ref 80.0–100.0)
Monocytes Absolute: 0.4 10*3/uL (ref 0.1–1.0)
Monocytes Relative: 4 %
Neutro Abs: 7.1 10*3/uL (ref 1.7–7.7)
Neutrophils Relative %: 87 %
Platelets: 268 10*3/uL (ref 150–400)
RBC: 3.68 MIL/uL — ABNORMAL LOW (ref 3.87–5.11)
RDW: 17.8 % — ABNORMAL HIGH (ref 11.5–15.5)
WBC: 8.3 10*3/uL (ref 4.0–10.5)
nRBC: 0.4 % — ABNORMAL HIGH (ref 0.0–0.2)

## 2023-11-16 LAB — URINALYSIS, COMPLETE (UACMP) WITH MICROSCOPIC
Bilirubin Urine: NEGATIVE
Glucose, UA: NEGATIVE mg/dL
Hgb urine dipstick: NEGATIVE
Ketones, ur: NEGATIVE mg/dL
Nitrite: NEGATIVE
Protein, ur: NEGATIVE mg/dL
Specific Gravity, Urine: 1.006 (ref 1.005–1.030)
pH: 5 (ref 5.0–8.0)

## 2023-11-16 LAB — MAGNESIUM: Magnesium: 2.1 mg/dL (ref 1.7–2.4)

## 2023-11-16 LAB — PHOSPHORUS: Phosphorus: 3.3 mg/dL (ref 2.5–4.6)

## 2023-11-16 MED ORDER — FUROSEMIDE 10 MG/ML IJ SOLN
40.0000 mg | Freq: Once | INTRAMUSCULAR | Status: AC
  Administered 2023-11-16: 40 mg via INTRAVENOUS
  Filled 2023-11-16: qty 4

## 2023-11-16 NOTE — ED Notes (Signed)
 Pt up ambulated to bathroom with walker for BM. NAD noted, purewick placed back on pt.

## 2023-11-16 NOTE — Progress Notes (Signed)
 Labs ordered.

## 2023-11-16 NOTE — ED Notes (Signed)
 Pt O2 sats dropping to 78-80% while resting  and return to 90s without intervention, NAD and pt denies [facility], continues to occur intermittently, pt repositioned in the bed and O2 reapplied at 2L [place]

## 2023-11-16 NOTE — Progress Notes (Signed)
*  PRELIMINARY RESULTS* Echocardiogram 2D Echocardiogram has been performed.  Virginia Walters 11/16/2023, 9:35 AM

## 2023-11-16 NOTE — Progress Notes (Signed)
 PROGRESS NOTE    Virginia Walters  ZOX:[id number] DOB: 1941-01-16 DOA: 11/14/2023 PCP: Donita Brooks, MD  Chief Complaint  Patient presents with   Abnormal Lab    Hospital Course:  Virginia Walters is 83 y.o. female with hypertension, hyperlipidemia, chronic back pain, neuropathy, presents to the ED due to critically low hemoglobin with PCP.  On arrival to the ED all vital signs are stable.  Creatinine 1.25, WBC 6.1, platelets 310, hemoglobin 5.0.  Anemia panel was ordered by EDP.  Patient was typed and screened for 2 units PRBCs.  Patient endorses history of endoscopy and colonoscopy in 2021 which she received at that time for acute anemia.  She reports they were negative for signs of GI bleed at that time.  Subjective: Patient is increasingly confused this morning.  She is able to accurately report the year, self, situation, but has forgotten much of this hospitalization and all things that we discussed yesterday.  She requires frequent prompting and reminders of previous conversations.  She continues to request to discharge home.  Objective: Vitals:   11/16/23 1200 11/16/23 1230 11/16/23 1500 11/16/23 1646  BP: (!) 160/59 (!) 154/61 98/78   Pulse: 76 75 84   Resp: (!) 27 (!) 23 (!) 30   Temp:  98.4 F (36.9 C)  98.4 F (36.9 C)  TempSrc:  Oral  Oral  SpO2: 94% 95% 94%   Weight:      Height:        Intake/Output Summary (Last 24 hours) at 11/16/2023 1657 Last data filed at 11/16/2023 1248 Gross per 24 hour  Intake --  Output 1800 ml  Net -1800 ml   Filed Weights   11/14/23 0856  Weight: 103 kg    Examination: General exam: Appears calm and comfortable, NAD  Respiratory system: No work of breathing, symmetric chest wall expansion, no wheeze, intermittent tachypnea Cardiovascular system: S1 & S2 heard, RRR.  Gastrointestinal system: Abdomen is nondistended, soft and nontender.  Neuro: Alert and oriented, intermittently confused.  Does not recall prior  conversations. Extremities: Symmetric, expected ROM Skin: No rashes, lesions  Assessment & Plan:  Principal Problem:   Symptomatic anemia Active Problems:   HTN (hypertension)   Dyslipidemia   GERD (gastroesophageal reflux disease)   Dementia with behavioral problem   Chronic systolic heart failure (HCC)   Acute cholecystitis s/p perc cholecystotomy drain 09/17/2021   Insomnia   COPD (chronic obstructive pulmonary disease) (HCC)   Chronic back pain   Symptomatic anemia - Iron: 9, TIBC 477, ferritin 2, TSAT 2 --Presumed GI loss though has not yet been found. - Patient has elected to follow-up outpatient for colonoscopy and endoscopy, this has been scheduled for next Monday.  GI has requested cardiology preoperative clearance - Cardiology consulted, no further ischemic workup necessary. - On PPI - Hematology has been consulted by admitting MD, will arrange for outpatient IV iron. - This post 3 units PRBCs and Venofer. - Continue to trend hemoglobin, goal over 8 given history of heart failure and CAD.  Altered mental status - Unclear etiology.  Patient is becoming increasingly confused today.  She has been in an ER bed for the entirety of her admission which is likely contributing to hospital delirium. - Will also get a UA - Patient does have history of alcohol abuse as below.  CIWA protocol. - Continue frequent reorientation, delirium precautions.  Alcohol Abuse -Patient's cousin reports that patient drinks at least 6 to 9 glasses of wine daily.  She believes this is the etiology for her prior gastric ulcer. - Will place the patient on CIWA protocol and continue to monitor closely - Ativan as needed  Chronic back pain - Lidocaine patch, as needed pain meds  COPD, and not currently in exacerbation - Intermittent wheezing, not consistently.  Reports this is consistent with her baseline. - As needed albuterol.  Insomnia - Resume home dose trazodone  Combined diastolic and  systolic heart failure - Prior echocardiogram showed decreased EF. Repeat echocardiogram this admission shows EF 55 to 60%, moderate left ventricular hypertrophy, grade 1 diastolic dysfunction. - Resume home meds  GERD --Continue Protonix  Dyslipidemia - On statin  Hypertension - Resume home meds  DVT prophylaxis: SCDS   Code Status: Full Code Family Communication:  Discussed directly with patient as well as her cousin, Virginia Walters on the phone. Disposition: Hoped to discharge today, however patient is becoming increasingly confused and intermittently tachypneic.  Patient lives independently and would be unsafe to discharge at this time with this level of confusion.  Will remain in house, repeat labs in the morning.  Continue frequent reorientation.  Workup ongoing.  Consultants:  Treatment Team:  Consulting Physician: Earna Coder, MD Consulting Physician: Iran Ouch, MD  Procedures:    Antimicrobials:  Anti-infectives (From admission, onward)    None       Data Reviewed: I have personally reviewed following labs and imaging studies CBC: Recent Labs  Lab 11/13/23 0939 11/14/23 0858 11/14/23 2230 11/15/23 0445 11/16/23 0507  WBC 5.7 6.1  --  7.8 8.3  NEUTROABS 3,631 3.8  --   --  7.1  HGB 5.1* 5.0* 7.7* 9.6* 9.1*  HCT 18.9* 17.5* 24.7* 30.4* 28.8*  MCV 80.8 78.8*  --  77.7* 78.3*  PLT 310 310  --  284 268   Basic Metabolic Panel: Recent Labs  Lab 11/13/23 0939 11/14/23 0858 11/15/23 0445 11/16/23 0507  NA 143 140 139 138  K 4.4 4.1 4.4 4.4  CL 110 109 107 106  CO2 24 22 22 24   GLUCOSE 103* 102* 92 137*  BUN 18 19 17 13   CREATININE 1.12* 1.25* 1.18* 1.03*  CALCIUM 9.6 8.8* 8.7* 8.9  MG  --   --   --  2.1  PHOS  --   --   --  3.3   GFR: Estimated Creatinine Clearance: 52 mL/min (A) (by C-G formula based on SCr of 1.03 mg/dL (H)). Liver Function Tests: Recent Labs  Lab 11/13/23 0939 11/14/23 0858 11/16/23 0507  AST 11 19 24   ALT 14  20 16   ALKPHOS  --  47 55  BILITOT 0.3 0.4 1.0  PROT 6.3 6.4* 6.6  ALBUMIN  --  3.6 3.6   CBG: No results for input(s): "GLUCAP" in the last 168 hours.  No results found for this or any previous visit (from the past 240 hours).   Radiology Studies: DG Chest Port 1 View Result Date: 11/16/2023 CLINICAL DATA:  Hypoxia. EXAM: PORTABLE CHEST 1 VIEW COMPARISON:  09/17/2021. FINDINGS: Low lung volume. Bilateral lung fields are clear. Bilateral costophrenic angles are clear. Stable cardio-mediastinal silhouette. Moderate sized retrocardiac hiatal hernia noted. No acute osseous abnormalities. The soft tissues are within normal limits. IMPRESSION: No active disease. Electronically Signed   By: Jules Schick M.D.   On: 11/16/2023 15:51   ECHOCARDIOGRAM COMPLETE Result Date: 11/16/2023    ECHOCARDIOGRAM REPORT   Patient Name:   Virginia Walters Date of Exam: 11/16/2023 Medical Rec #:  [id number]         Height:       67.0 in Accession #:    [id number]        Weight:       227.0 lb Date of Birth:  08/24/1940        BSA:          2.134 m Patient Age:    82 years          BP:           149/62 mmHg Patient Gender: F                 HR:           73 bpm. Exam Location:  [facility] Procedure: 2D Echo, Cardiac Doppler, Color Doppler and Strain Analysis (Both            Spectral and Color Flow Doppler were utilized during procedure). Indications:     Preoperative evaluation  History:         Patient has prior history of Echocardiogram examinations, most                  recent 07/31/2018. CHF, Abnormal ECG, COPD; Risk                  Factors:Hypertension and Dyslipidemia. Known PFO, Dementia,                  CKD.  Sonographer:     Mikki Harbor Referring Phys:  [phone] CHRISTOPHER END Diagnosing Phys: Lorine Bears MD  Sonographer Comments: Global longitudinal strain was attempted. IMPRESSIONS  1. Left ventricular ejection fraction, by estimation, is 55 to 60%. The left ventricle has normal function. The left ventricle has  no regional wall motion abnormalities. There is moderate left ventricular hypertrophy. Left ventricular diastolic parameters are consistent with Grade I diastolic dysfunction (impaired relaxation). The average left ventricular global longitudinal strain is -15.4 %. The global longitudinal strain is normal.  2. Right ventricular systolic function is normal. The right ventricular size is normal. There is normal pulmonary artery systolic pressure.  3. Left atrial size was moderately dilated.  4. Right atrial size was mildly dilated.  5. The mitral valve is normal in structure. Mild mitral valve regurgitation. No evidence of mitral stenosis.  6. The aortic valve is normal in structure. Aortic valve regurgitation is trivial. Aortic valve sclerosis/calcification is present, without any evidence of aortic stenosis.  7. The inferior vena cava is normal in size with greater than 50% respiratory variability, suggesting right atrial pressure of 3 mmHg. FINDINGS  Left Ventricle: Left ventricular ejection fraction, by estimation, is 55 to 60%. The left ventricle has normal function. The left ventricle has no regional wall motion abnormalities. The average left ventricular global longitudinal strain is -15.4 %. Strain was performed and the global longitudinal strain is normal. The left ventricular internal cavity size was normal in size. There is moderate left ventricular hypertrophy. Left ventricular diastolic parameters are consistent with Grade I diastolic dysfunction (impaired relaxation). Right Ventricle: The right ventricular size is normal. No increase in right ventricular wall thickness. Right ventricular systolic function is normal. There is normal pulmonary artery systolic pressure. The tricuspid regurgitant velocity is 2.57 m/s, and  with an assumed right atrial pressure of 3 mmHg, the estimated right ventricular systolic pressure is 29.4 mmHg. Left Atrium: Left atrial size was moderately dilated. Right Atrium: Right  atrial size was mildly dilated. Pericardium: Trivial pericardial effusion is present.  Mitral Valve: The mitral valve is normal in structure. There is mild thickening of the mitral valve leaflet(s). There is mild calcification of the mitral valve leaflet(s). Mild mitral valve regurgitation. No evidence of mitral valve stenosis. MV peak gradient, 7.2 mmHg. The mean mitral valve gradient is 2.0 mmHg. Tricuspid Valve: The tricuspid valve is normal in structure. Tricuspid valve regurgitation is trivial. No evidence of tricuspid stenosis. Aortic Valve: The aortic valve is normal in structure. Aortic valve regurgitation is trivial. Aortic valve sclerosis/calcification is present, without any evidence of aortic stenosis. Aortic valve mean gradient measures 4.0 mmHg. Aortic valve peak gradient measures 9.1 mmHg. Aortic valve area, by VTI measures 3.16 cm. Pulmonic Valve: The pulmonic valve was normal in structure. Pulmonic valve regurgitation is mild. No evidence of pulmonic stenosis. Aorta: The aortic root is normal in size and structure. Venous: The inferior vena cava is normal in size with greater than 50% respiratory variability, suggesting right atrial pressure of 3 mmHg. IAS/Shunts: No atrial level shunt detected by color flow Doppler.  LEFT VENTRICLE PLAX 2D LVIDd:         5.40 cm     Diastology LVIDs:         3.40 cm     LV e' medial:    6.09 cm/s LV PW:         1.30 cm     LV E/e' medial:  13.3 LV IVS:        1.40 cm     LV e' lateral:   6.96 cm/s LVOT diam:     2.00 cm     LV E/e' lateral: 11.6 LV SV:         109 LV SV Index:   51          2D Longitudinal Strain LVOT Area:     3.14 cm    2D Strain GLS Avg:     -15.4 %  LV Volumes (MOD) LV vol d, MOD A2C: 72.9 ml LV vol d, MOD A4C: 85.6 ml LV vol s, MOD A2C: 38.5 ml LV vol s, MOD A4C: 32.7 ml LV SV MOD A2C:     34.4 ml LV SV MOD A4C:     85.6 ml LV SV MOD BP:      44.1 ml RIGHT VENTRICLE RV Basal diam:  3.85 cm RV Mid diam:    3.40 cm RV S prime:     14.50 cm/s  TAPSE (M-mode): 2.4 cm LEFT ATRIUM              Index        RIGHT ATRIUM           Index LA diam:        4.30 cm  2.01 cm/m   RA Area:     19.50 cm LA Vol (A2C):   110.0 ml 51.54 ml/m  RA Volume:   57.60 ml  26.99 ml/m LA Vol (A4C):   73.8 ml  34.58 ml/m LA Biplane Vol: 92.8 ml  43.48 ml/m  AORTIC VALVE                    PULMONIC VALVE AV Area (Vmax):    2.81 cm     PV Vmax:       1.10 m/s AV Area (Vmean):   2.79 cm     PV Peak grad:  4.8 mmHg AV Area (VTI):     3.16 cm AV Vmax:  151.00 cm/s AV Vmean:          93.400 cm/s AV VTI:            0.344 m AV Peak Grad:      9.1 mmHg AV Mean Grad:      4.0 mmHg LVOT Vmax:         135.00 cm/s LVOT Vmean:        82.900 cm/s LVOT VTI:          0.346 m LVOT/AV VTI ratio: 1.01  AORTA Ao Root diam: 3.40 cm Ao Asc diam:  3.30 cm MITRAL VALVE                TRICUSPID VALVE MV Area (PHT): 3.83 cm     TR Peak grad:   26.4 mmHg MV Area VTI:   2.74 cm     TR Vmax:        257.00 cm/s MV Peak grad:  7.2 mmHg MV Mean grad:  2.0 mmHg     SHUNTS MV Vmax:       1.34 m/s     Systemic VTI:  0.35 m MV Vmean:      66.3 cm/s    Systemic Diam: 2.00 cm MV Decel Time: 198 msec MV E velocity: 80.70 cm/s MV A velocity: 125.00 cm/s MV E/A ratio:  0.65 Lorine Bears MD Electronically signed by Lorine Bears MD Signature Date/Time: 11/16/2023/12:42:17 PM    Final     Scheduled Meds:  ascorbic acid  500 mg Oral Daily   dorzolamide  1 drop Left Eye BID   doxazosin  2 mg Oral Daily   feeding supplement  1 Container Oral TID BM   lisinopril  40 mg Oral Daily   multivitamin with minerals  1 tablet Oral Daily   omega-3 acid ethyl esters  1 g Oral Daily   pantoprazole (PROTONIX) IV  40 mg Intravenous BID   pravastatin  40 mg Oral Daily   predniSONE  40 mg Oral Daily   traZODone  50 mg Oral QHS   Continuous Infusions:  sodium chloride       LOS: 2 days  MDM: Patient is high risk for one or more organ failure.  They necessitate ongoing hospitalization for continued IV  therapies and subsequent lab monitoring. Total time spent interpreting labs and vitals, coordinating care amongst consultants and care team members, directly assessing and discussing care with the patient and/or family: 55 min    Debarah Crape, DO [facility]  To contact the attending physician between 7A-7P please use Epic Chat. To contact the covering physician during after hours 7P-7A, please review Amion.   11/16/2023, 4:57 PM   *This document has been created with the assistance of dictation software. Please excuse typographical errors. *

## 2023-11-16 NOTE — Progress Notes (Signed)
   Patient Name: Virginia Walters Date of Encounter: 11/16/2023 [facility] Cardiologist: None   Interval Summary  .    Hgb 9.1 this AM. Patient denies any over bleeding. She denies chest pain or shortness of breath. Still awaiting echo.   Vital Signs .    Vitals:   11/16/23 0001 11/16/23 0320 11/16/23 0330 11/16/23 0504  BP: (!) 145/82   (!) 143/65  Pulse: 71 73 68 76  Resp: 18 16 14 15   Temp: 98.1 F (36.7 C)   98.1 F (36.7 C)  TempSrc: Oral   Oral  SpO2: 96% (!) 79% 97% 93%  Weight:      Height:        Intake/Output Summary (Last 24 hours) at 11/16/2023 0743 Last data filed at 11/16/2023 0055 Gross per 24 hour  Intake --  Output 800 ml  Net -800 ml      11/14/2023    8:56 AM 11/13/2023    9:13 AM 12/06/2022   10:09 AM  Last 3 Weights  Weight (lbs) 227 lb 221 lb 219 lb  Weight (kg) 102.967 kg 100.245 kg 99.338 kg      Telemetry/ECG    NSR HR 70s, PVCs - Personally Reviewed  Physical Exam .   GEN: No acute distress.   Neck: No JVD Cardiac: RRR, no murmurs, rubs, or gallops.  Respiratory: Clear to auscultation bilaterally. GI: Soft, nontender, non-distended  MS: No edema  Assessment & Plan .    Pre-op cardiac evaluation - admitted with symptomatic and transfusion dependent anemia with initial Hgb 5.1. Gi is planning outpatient EGD/colonoscopy 11/20/23. Cardiology consulted for risk stratification - patient is largely sedentary - breathing is at baseline - EGD and colonoscopy are low risk procedures - EKG with NSR and no ischemic changes - given h/o cardiomyopathy echo has been ordered. If this is OK can proceed without further cardiac testing  HFmrEF - patient is euvolemic and well compensated - s/p 2 units PRBCs - echo ordered  - continue PTA lisinopril  Symptomatic Anemia - s/p 2 units PRBC and IV iron infusion - Hgb 5.1>9.1 - denies hematochezia or melena - management per IM and GI  Coronary artery calcification - she denies anginal  symptoms - prior stress test In 2020 without ischemia or infarction, overall low risk - EKG nonischemic - continue statin - no further ischemic testing required  HTN - Blood pressures mildly elevated - continue PTA lisinopril and Cardua  For questions or updates, please contact [facility] Please consult [email or web address] for contact info under        Signed, Rocio Roam David Stall, PA-C

## 2023-11-16 NOTE — ED Notes (Signed)
 Provider notified of foul smelling urine and increasing intermittent episodes of confusion. Provider went to bedside and assessed patient. No new orders at this time. Will continue to monitor.

## 2023-11-17 ENCOUNTER — Encounter: Payer: Self-pay | Admitting: Gastroenterology

## 2023-11-17 DIAGNOSIS — D649 Anemia, unspecified: Secondary | ICD-10-CM | POA: Diagnosis not present

## 2023-11-17 LAB — MAGNESIUM: Magnesium: 2.1 mg/dL (ref 1.7–2.4)

## 2023-11-17 LAB — CBC WITH DIFFERENTIAL/PLATELET
Abs Immature Granulocytes: 0.08 10*3/uL — ABNORMAL HIGH (ref 0.00–0.07)
Basophils Absolute: 0 10*3/uL (ref 0.0–0.1)
Basophils Relative: 0 %
Eosinophils Absolute: 0 10*3/uL (ref 0.0–0.5)
Eosinophils Relative: 0 %
HCT: 30.5 % — ABNORMAL LOW (ref 36.0–46.0)
Hemoglobin: 9.3 g/dL — ABNORMAL LOW (ref 12.0–15.0)
Immature Granulocytes: 1 %
Lymphocytes Relative: 16 %
Lymphs Abs: 2.3 10*3/uL (ref 0.7–4.0)
MCH: 25.1 pg — ABNORMAL LOW (ref 26.0–34.0)
MCHC: 30.5 g/dL (ref 30.0–36.0)
MCV: 82.4 fL (ref 80.0–100.0)
Monocytes Absolute: 1.4 10*3/uL — ABNORMAL HIGH (ref 0.1–1.0)
Monocytes Relative: 10 %
Neutro Abs: 10.7 10*3/uL — ABNORMAL HIGH (ref 1.7–7.7)
Neutrophils Relative %: 73 %
Platelets: 218 10*3/uL (ref 150–400)
RBC: 3.7 MIL/uL — ABNORMAL LOW (ref 3.87–5.11)
RDW: 18.5 % — ABNORMAL HIGH (ref 11.5–15.5)
WBC: 14.6 10*3/uL — ABNORMAL HIGH (ref 4.0–10.5)
nRBC: 0 % (ref 0.0–0.2)

## 2023-11-17 LAB — COMPREHENSIVE METABOLIC PANEL WITH GFR
ALT: 17 U/L (ref 0–44)
AST: 21 U/L (ref 15–41)
Albumin: 3.4 g/dL — ABNORMAL LOW (ref 3.5–5.0)
Alkaline Phosphatase: 46 U/L (ref 38–126)
Anion gap: 9 (ref 5–15)
BUN: 22 mg/dL (ref 8–23)
CO2: 25 mmol/L (ref 22–32)
Calcium: 8.7 mg/dL — ABNORMAL LOW (ref 8.9–10.3)
Chloride: 106 mmol/L (ref 98–111)
Creatinine, Ser: 1.15 mg/dL — ABNORMAL HIGH (ref 0.44–1.00)
GFR, Estimated: 48 mL/min — ABNORMAL LOW (ref >60)
Glucose, Bld: 93 mg/dL (ref 70–99)
Potassium: 3.7 mmol/L (ref 3.5–5.1)
Sodium: 140 mmol/L (ref 135–145)
Total Bilirubin: 0.5 mg/dL (ref 0.0–1.2)
Total Protein: 6.3 g/dL — ABNORMAL LOW (ref 6.5–8.1)

## 2023-11-17 LAB — PHOSPHORUS: Phosphorus: 2.5 mg/dL (ref 2.5–4.6)

## 2023-11-17 MED ORDER — PANTOPRAZOLE SODIUM 40 MG PO TBEC
40.0000 mg | DELAYED_RELEASE_TABLET | Freq: Every day | ORAL | 0 refills | Status: AC
Start: 2023-11-17 — End: 2024-02-27

## 2023-11-17 NOTE — Progress Notes (Signed)
 Pt has been discharged but contact/ride Steele Sizer) has not answered the phone when calling to inform of discharge.Pt has tried calling as well with no answer. Will continue to try contacting family.

## 2023-11-17 NOTE — Evaluation (Addendum)
 Physical Therapy Evaluation Patient Details Name: Virginia Walters MRN: [id number] DOB: 01/22/1941 Today's Date: 11/17/2023  History of Present Illness  83 y/o female presented to ED on 11/14/23 for abnormal hemoglobin of 5 along with dizziness. Admitted for symptomatic anemia. Developed AMS while in hospital. PMH: HTN, neuropathy, chronic back pain, alcohol abuse, COPD, CHF  Clinical Impression  Patient admitted with the above. PTA, patient lives alone and was modI using RW in the home. Patient ambulatory in hallway with supervision and RW. Patient reports feeling near baseline functionally. Encouraged use of RW in/out of home for safety to reduce fall risk. Patient will benefit from skilled PT services during acute stay to address listed deficits. Patient will benefit from ongoing therapy at discharge to maximize functional independence and safety.      If plan is discharge home, recommend the following: Help with stairs or ramp for entrance   Can travel by private vehicle        Equipment Recommendations None recommended by PT  Recommendations for Other Services       Functional Status Assessment Patient has had a recent decline in their functional status and demonstrates the ability to make significant improvements in function in a reasonable and predictable amount of time.     Precautions / Restrictions Precautions Precautions: Fall Recall of Precautions/Restrictions: Intact Restrictions Weight Bearing Restrictions Per Provider Order: No      Mobility  Bed Mobility Overal bed mobility: Modified Independent                  Transfers Overall transfer level: Modified independent Equipment used: Rolling Teagen Bucio (2 wheels)                    Ambulation/Gait Ambulation/Gait assistance: Supervision Gait Distance (Feet): 175 Feet Assistive device: Rolling Momina Hunton (2 wheels) Gait Pattern/deviations: Step-through pattern, Decreased stride length           Stairs            Wheelchair Mobility     Tilt Bed    Modified Rankin (Stroke Patients Only)       Balance Overall balance assessment: Modified Independent                                           Pertinent Vitals/Pain      Home Living Family/patient expects to be discharged to:: Private residence Living Arrangements: Alone   Type of Home: House Home Access: Stairs to enter Entrance Stairs-Rails: Right Entrance Stairs-Number of Steps: 6   Home Layout: One level Home Equipment: Agricultural consultant (2 wheels);Shower seat;Cane - single point      Prior Function Prior Level of Function : Independent/Modified Independent;Driving             Mobility Comments: RW inhouse, SPC in community ADLs Comments: assist for cleaning     Extremity/Trunk Assessment   Upper Extremity Assessment Upper Extremity Assessment: Overall WFL for tasks assessed    Lower Extremity Assessment Lower Extremity Assessment: Overall WFL for tasks assessed       Communication   Communication Communication: No apparent difficulties    Cognition Arousal: Alert Behavior During Therapy: WFL for tasks assessed/performed                             Following commands: Intact  Cueing       General Comments      Exercises     Assessment/Plan    PT Assessment Patient does not need any further PT services  PT Problem List Decreased strength;Decreased activity tolerance;Decreased balance;Decreased mobility       PT Treatment Interventions      PT Goals (Current goals can be found in the Care Plan section)  Acute Rehab PT Goals Patient Stated Goal: to go home PT Goal Formulation: With patient    Frequency       Co-evaluation               AM-PAC PT "6 Clicks" Mobility  Outcome Measure Help needed turning from your back to your side while in a flat bed without using bedrails?: None Help needed moving from lying on your  back to sitting on the side of a flat bed without using bedrails?: None Help needed moving to and from a bed to a chair (including a wheelchair)?: None Help needed standing up from a chair using your arms (e.g., wheelchair or bedside chair)?: None Help needed to walk in hospital room?: None Help needed climbing 3-5 steps with a railing? : A Little 6 Click Score: 23    End of Session   Activity Tolerance: Patient tolerated treatment well Patient left: in chair;with call bell/phone within reach;with chair alarm set Nurse Communication: Mobility status PT Visit Diagnosis: Muscle weakness (generalized) (M62.81)    Time: 4540-9811 PT Time Calculation (min) (ACUTE ONLY): 12 min   Charges:   PT Evaluation $PT Eval Low Complexity: 1 Low   PT General Charges $$ ACUTE PT VISIT: 1 Visit         Maylon Peppers, PT, DPT Physical Therapist - [facility]  [facility]   Cierria Height A Melis Trochez 11/17/2023, 1:25 PM

## 2023-11-17 NOTE — Care Management Important Message (Signed)
 Important Message  Patient Details  Name: LUBERTA GRABINSKI MRN: [id number] Date of Birth: 04/18/1941   Important Message Given:  Yes - Medicare IM     Marcell Anger 11/17/2023, 10:56 AM

## 2023-11-17 NOTE — Evaluation (Signed)
 Occupational Therapy Evaluation Patient Details Name: Virginia Walters MRN: [id number] DOB: March 01, 1941 Today's Date: 11/17/2023   History of Present Illness   83 y/o female presented to ED on 11/14/23 for abnormal hemoglobin of 5 along with dizziness. Admitted for symptomatic anemia. Developed AMS while in hospital. PMH: HTN, neuropathy, chronic back pain, alcohol abuse, COPD, CHF   Clinical Impressions [name] was seen for OT evaluation this date. Prior to hospital admission, pt was MOD I using RW. Pt lives alone in home c 6 STE. Pt currently requires SUPERVISION + RW for ADL t/f. MOD I don shoes in sitting. Pt would benefit from skilled OT to address noted impairments and functional limitations (see below for any additional details). Upon hospital discharge, recommend no OT follow up.   If plan is discharge home, recommend the following:   Help with stairs or ramp for entrance     Functional Status Assessment   Patient has had a recent decline in their functional status and demonstrates the ability to make significant improvements in function in a reasonable and predictable amount of time.     Equipment Recommendations   None recommended by OT     Recommendations for Other Services         Precautions/Restrictions   Precautions Precautions: Fall Recall of Precautions/Restrictions: Intact Restrictions Weight Bearing Restrictions Per Provider Order: No     Mobility Bed Mobility Overal bed mobility: Modified Independent                  Transfers Overall transfer level: Modified independent Equipment used: Rolling walker (2 wheels)                      Balance Overall balance assessment: Modified Independent                                         ADL either performed or assessed with clinical judgement   ADL Overall ADL's : Needs assistance/impaired                                       General ADL  Comments: SUPERVISION + RW for ADL t/f. MOD I don shoes in sitting      Pertinent Vitals/Pain Pain Assessment Pain Assessment: No/denies pain     Extremity/Trunk Assessment Upper Extremity Assessment Upper Extremity Assessment: Overall WFL for tasks assessed   Lower Extremity Assessment Lower Extremity Assessment: Overall WFL for tasks assessed       Communication Communication Communication: No apparent difficulties   Cognition Arousal: Alert Behavior During Therapy: WFL for tasks assessed/performed Cognition: History of cognitive impairments             OT - Cognition Comments: A&O x4                 Following commands: Intact                  Home Living Family/patient expects to be discharged to:: Private residence Living Arrangements: Alone   Type of Home: House Home Access: Stairs to enter Secretary/administrator of Steps: 6 Entrance Stairs-Rails: Right Home Layout: One level     Bathroom Shower/Tub: Walk-in shower         Home Equipment: Agricultural consultant (2 wheels);Shower seat;Cane - single point  Prior Functioning/Environment Prior Level of Function : Independent/Modified Independent;Driving             Mobility Comments: RW inhouse, SPC in community ADLs Comments: assist for cleaning    OT Problem List: Decreased strength;Decreased range of motion;Decreased activity tolerance;Impaired balance (sitting and/or standing)   OT Treatment/Interventions: Self-care/ADL training;Therapeutic exercise;Energy conservation;DME and/or AE instruction;Therapeutic activities      OT Goals(Current goals can be found in the care plan section)   Acute Rehab OT Goals Patient Stated Goal: go home OT Goal Formulation: With patient Time For Goal Achievement: 12/01/23 Potential to Achieve Goals: Good ADL Goals Pt Will Perform Grooming: with modified independence;standing Pt Will Perform Lower Body Dressing: with modified  independence;sit to/from stand Pt Will Transfer to Toilet: with modified independence;ambulating;regular height toilet   OT Frequency:  Min 1X/week    Co-evaluation              AM-PAC OT "6 Clicks" Daily Activity     Outcome Measure Help from another person eating meals?: None Help from another person taking care of personal grooming?: None Help from another person toileting, which includes using toliet, bedpan, or urinal?: None Help from another person bathing (including washing, rinsing, drying)?: A Little Help from another person to put on and taking off regular upper body clothing?: None Help from another person to put on and taking off regular lower body clothing?: None 6 Click Score: 23   End of Session Equipment Utilized During Treatment: Rolling walker (2 wheels)  Activity Tolerance: Patient tolerated treatment well Patient left: in chair;with call bell/phone within reach;with chair alarm set  OT Visit Diagnosis: Muscle weakness (generalized) (M62.81)                Time: 4010-2725 OT Time Calculation (min): 21 min Charges:  OT General Charges $OT Visit: 1 Visit OT Evaluation $OT Eval Moderate Complexity: 1 Mod  Kathie Dike, M.S. OTR/L  11/17/23, 1:06 PM  ascom [phone]

## 2023-11-17 NOTE — Discharge Summary (Signed)
 Physician Discharge Summary  Virginia Walters ZOX:[id number] DOB: 03-Sep-1940 DOA: 11/14/2023  PCP: Donita Brooks, MD  Admit date: 11/14/2023 Discharge date: 11/17/2023  Admitted From: home  Disposition:  home   Recommendations for Outpatient Follow-up:  Follow up with PCP in 1-2 weeks F/u w/ GI, Dr. Servando Snare, within 1 week F/u w/ heme/onco, Dr. Donneta Romberg, within 1 week F/u w/ cardio, Dr. Okey Dupre, in 1-3 weeks   Home Health: no  Equipment/Devices:  Discharge Condition: stable  CODE STATUS: full  Diet recommendation: Heart Healthy  Brief/Interim Summary: HPI was taken from Dr. Sedalia Muta: Virginia Walters is a 83 year old female with history of hypertension, hyperlipidemia, chronic back pain, neuropathy, who presents emergency department for chief concerns of abnormal labs outpatient.   Vitals in the ED showed temperature of 97.8, respiration rate of 20, heart rate 74, blood pressure 122/48, SpO2 of 98% on room air.   Serum sodium is 140, potassium 4.1, chloride 109, bicarb 22, BUN of 19, serum creatinine 1.25, EGFR 43, nonfasting blood glucose 102, WBC 6.1, hemoglobin 5.0, platelets of 310.   AST, ALT were within normal limits.   Anemia panel was ordered by EDP and pending results at this time.   ED treatment: Patient was type and screen and 2 units PRBC have been ordered for transfusion. --------------------------------- At bedside, patient was able to tell me her first and last name, age, location, current calendar year.   She reports that she has been having worsening weakness over the last few days with chronic back pain.   She reports that she does not take NSAIDs.  She reports that she came to her PCP and was being evaluated for an NSAID, meloxicam.  She was then told to come to the ED because of her low blood count.   She endorses generalized weakness especially with exertion.  She denies shortness of breath, chest pain, dysuria, hematuria, hemoptysis, hematemesis, coffee-ground  emesis, melena stool.  She denies vaginal bleeding.  She reports that she wears depends and has not noticed any blood in the depends.  She denies bright red blood per rectum.  She denies vaginal bleeding.  She denies fever, chills, syncope, loss of consciousness.   She reports she had a similar episode in 2021 where she had hemoglobin was low and got blood transfusion.  She had endoscopy and colonoscopy which were negative for signs of GI bleeding at that time.   She reports that she does not eat red meat regularly only occasionally.  She endorses that she does eat chicken.  She is not vegan.  Discharge Diagnoses:  Principal Problem:   Symptomatic anemia Active Problems:   HTN (hypertension)   Dyslipidemia   GERD (gastroesophageal reflux disease)   Dementia with behavioral problem   Chronic systolic heart failure (HCC)   Acute cholecystitis s/p perc cholecystotomy drain 09/17/2021   Insomnia   COPD (chronic obstructive pulmonary disease) (HCC)   Chronic back pain  IDA: symptomatic. Possibly secondary to GI loss. S/p 3 units of pRBCs & IV iron. No more NSAIDs. Will f/u outpatient w/ GI for endoscopy & colonoscopy. Will also f/u outpatient w/ heme   Acute encephalopathy: etiology unclear, delirium vs alcohol abuse induced vs dementia. Mental status is back to baseline     Alcohol Abuse: drinks 6-9 glasses of wine daily. Continue on CIWA protocol. Received alcohol cessation counseling already   Chronic back pain: continue w/ lidocaine patch.   COPD: w/o exacerbation. Bronchodilators prn & encourage incentive spirometry  Insomnia: continue on home dose of trazodone    Chronic combined CHF: prior echocardiogram showed decreased EF. Repeat echo this admission shows EF 55 -60% grade I diastolic dysfunction.   GERD: continue on PPI   HLD: continue on statin    HTN: continue on home dose of lisinopril   Discharge Instructions  Discharge Instructions     Diet - low sodium heart  healthy   Complete by: As directed    Discharge instructions   Complete by: As directed    F/u w/ PCP in 1-2 weeks. F/u w/ GI, Dr. Servando Snare, within 1 week. F/u w/ cardio, Dr. Okey Dupre, in 1-3 weeks   Discharge instructions   Complete by: As directed    F/u w/ heme/onco, Dr. Donneta Romberg, within 1 week   Increase activity slowly   Complete by: As directed       Allergies as of 11/17/2023       Reactions   Ibuprofen    Oxycodone Itching        Medication List     STOP taking these medications    meloxicam 15 MG tablet Commonly known as: MOBIC       TAKE these medications    Breztri Aerosphere 160-9-4.8 MCG/ACT Aero Generic drug: budeson-glycopyrrolate-formoterol USE 2 INHALATIONS BY MOUTH INTO  THE LUNGS IN THE MORNING AND AT  BEDTIME   dorzolamide 2 % ophthalmic solution Commonly known as: TRUSOPT Place 1 drop into the left eye 2 (two) times daily.   doxazosin 4 MG tablet Commonly known as: CARDURA TAKE ONE-HALF TABLET BY MOUTH  DAILY   feeding supplement Liqd Take 237 mLs by mouth 2 (two) times daily between meals.   fish oil-omega-3 fatty acids 1000 MG capsule Take 1,000 mg by mouth daily.   lisinopril 40 MG tablet Commonly known as: ZESTRIL TAKE 1 TABLET BY MOUTH DAILY   multivitamin tablet Take 1 tablet by mouth daily.   pantoprazole 40 MG tablet Commonly known as: Protonix Take 1 tablet (40 mg total) by mouth daily.   pravastatin 40 MG tablet Commonly known as: PRAVACHOL Take 1 tablet (40 mg total) by mouth daily. 09/29/23-PT NEEDS APPOINTMENT   solifenacin 10 MG tablet Commonly known as: VESICARE Take 1 tablet (10 mg total) by mouth daily.   traZODone 50 MG tablet Commonly known as: DESYREL TAKE 1 TABLET BY MOUTH AT  BEDTIME   VITAMIN C PO Take 1 tablet by mouth daily.        Follow-up Information     Midge Minium, MD Follow up.   Specialty: Gastroenterology Why: F/u within 1 week Contact information: [address] [place]  [place]  [address] [phone]         Earna Coder, MD Follow up.   Specialties: Internal Medicine, Oncology Why: F/u within 1 week Contact information: [address] [place] [address] [phone]         Donita Brooks, MD Follow up.   Specialty: Family Medicine Why: F/u in 1-2 weeks Contact information: [address] [place] [address] [phone]         Yvonne Kendall, MD Follow up.   Specialty: Cardiology Why: F/u in 1-3 weeks Contact information: [address] [place] [address] [phone]                Allergies  Allergen Reactions   Ibuprofen    Oxycodone Itching    Consultations: GI  Cardio Heme/onco    Procedures/Studies:  DG Chest Port 1 View Result Date: 11/16/2023 CLINICAL DATA:  Hypoxia. EXAM: PORTABLE CHEST 1 VIEW COMPARISON:  09/17/2021. FINDINGS: Low lung volume. Bilateral lung fields are clear. Bilateral costophrenic angles are clear. Stable cardio-mediastinal silhouette. Moderate sized retrocardiac hiatal hernia noted. No acute osseous abnormalities. The soft tissues are within normal limits. IMPRESSION: No active disease. Electronically Signed   By: Jules Schick M.D.   On: 11/16/2023 15:51   ECHOCARDIOGRAM COMPLETE Result Date: 11/16/2023    ECHOCARDIOGRAM REPORT   Patient Name:   Virginia Walters Date of Exam: 11/16/2023 Medical Rec #:  [id number]         Height:       67.0 in Accession #:    [id number]        Weight:       227.0 lb Date of Birth:  October 17, 1940        BSA:          2.134 m Patient Age:    82 years          BP:           149/62 mmHg Patient Gender: F                 HR:           73 bpm. Exam Location:  [facility] Procedure: 2D Echo, Cardiac Doppler, Color Doppler and Strain Analysis (Both            Spectral and Color Flow Doppler were utilized during procedure). Indications:     Preoperative evaluation  History:         Patient has prior history of Echocardiogram examinations,  most                  recent 07/31/2018. CHF, Abnormal ECG, COPD; Risk                  Factors:Hypertension and Dyslipidemia. Known PFO, Dementia,                  CKD.  Sonographer:     Mikki Harbor Referring Phys:  [phone] CHRISTOPHER END Diagnosing Phys: Lorine Bears MD  Sonographer Comments: Global longitudinal strain was attempted. IMPRESSIONS  1. Left ventricular ejection fraction, by estimation, is 55 to 60%. The left ventricle has normal function. The left ventricle has no regional wall motion abnormalities. There is moderate left ventricular hypertrophy. Left ventricular diastolic parameters are consistent with Grade I diastolic dysfunction (impaired relaxation). The average left ventricular global longitudinal strain is -15.4 %. The global longitudinal strain is normal.  2. Right ventricular systolic function is normal. The right ventricular size is normal. There is normal pulmonary artery systolic pressure.  3. Left atrial size was moderately dilated.  4. Right atrial size was mildly dilated.  5. The mitral valve is normal in structure. Mild mitral valve regurgitation. No evidence of mitral stenosis.  6. The aortic valve is normal in structure. Aortic valve regurgitation is trivial. Aortic valve sclerosis/calcification is present, without any evidence of aortic stenosis.  7. The inferior vena cava is normal in size with greater than 50% respiratory variability, suggesting right atrial pressure of 3 mmHg. FINDINGS  Left Ventricle: Left ventricular ejection fraction, by estimation, is 55 to 60%. The left ventricle has normal function. The left ventricle has no regional wall motion abnormalities. The average left ventricular global longitudinal strain is -15.4 %. Strain was performed and the global longitudinal strain is normal. The left ventricular internal cavity size  was normal in size. There is moderate left ventricular hypertrophy. Left ventricular diastolic parameters are consistent with Grade I  diastolic dysfunction (impaired relaxation). Right Ventricle: The right ventricular size is normal. No increase in right ventricular wall thickness. Right ventricular systolic function is normal. There is normal pulmonary artery systolic pressure. The tricuspid regurgitant velocity is 2.57 m/s, and  with an assumed right atrial pressure of 3 mmHg, the estimated right ventricular systolic pressure is 29.4 mmHg. Left Atrium: Left atrial size was moderately dilated. Right Atrium: Right atrial size was mildly dilated. Pericardium: Trivial pericardial effusion is present. Mitral Valve: The mitral valve is normal in structure. There is mild thickening of the mitral valve leaflet(s). There is mild calcification of the mitral valve leaflet(s). Mild mitral valve regurgitation. No evidence of mitral valve stenosis. MV peak gradient, 7.2 mmHg. The mean mitral valve gradient is 2.0 mmHg. Tricuspid Valve: The tricuspid valve is normal in structure. Tricuspid valve regurgitation is trivial. No evidence of tricuspid stenosis. Aortic Valve: The aortic valve is normal in structure. Aortic valve regurgitation is trivial. Aortic valve sclerosis/calcification is present, without any evidence of aortic stenosis. Aortic valve mean gradient measures 4.0 mmHg. Aortic valve peak gradient measures 9.1 mmHg. Aortic valve area, by VTI measures 3.16 cm. Pulmonic Valve: The pulmonic valve was normal in structure. Pulmonic valve regurgitation is mild. No evidence of pulmonic stenosis. Aorta: The aortic root is normal in size and structure. Venous: The inferior vena cava is normal in size with greater than 50% respiratory variability, suggesting right atrial pressure of 3 mmHg. IAS/Shunts: No atrial level shunt detected by color flow Doppler.  LEFT VENTRICLE PLAX 2D LVIDd:         5.40 cm     Diastology LVIDs:         3.40 cm     LV e' medial:    6.09 cm/s LV PW:         1.30 cm     LV E/e' medial:  13.3 LV IVS:        1.40 cm     LV e' lateral:    6.96 cm/s LVOT diam:     2.00 cm     LV E/e' lateral: 11.6 LV SV:         109 LV SV Index:   51          2D Longitudinal Strain LVOT Area:     3.14 cm    2D Strain GLS Avg:     -15.4 %  LV Volumes (MOD) LV vol d, MOD A2C: 72.9 ml LV vol d, MOD A4C: 85.6 ml LV vol s, MOD A2C: 38.5 ml LV vol s, MOD A4C: 32.7 ml LV SV MOD A2C:     34.4 ml LV SV MOD A4C:     85.6 ml LV SV MOD BP:      44.1 ml RIGHT VENTRICLE RV Basal diam:  3.85 cm RV Mid diam:    3.40 cm RV S prime:     14.50 cm/s TAPSE (M-mode): 2.4 cm LEFT ATRIUM              Index        RIGHT ATRIUM           Index LA diam:        4.30 cm  2.01 cm/m   RA Area:     19.50 cm LA Vol (A2C):   110.0 ml 51.54 ml/m  RA Volume:   57.60 ml  26.99 ml/m LA Vol (A4C):   73.8 ml  34.58 ml/m LA Biplane Vol: 92.8 ml  43.48 ml/m  AORTIC VALVE                    PULMONIC VALVE AV Area (Vmax):    2.81 cm     PV Vmax:       1.10 m/s AV Area (Vmean):   2.79 cm     PV Peak grad:  4.8 mmHg AV Area (VTI):     3.16 cm AV Vmax:           151.00 cm/s AV Vmean:          93.400 cm/s AV VTI:            0.344 m AV Peak Grad:      9.1 mmHg AV Mean Grad:      4.0 mmHg LVOT Vmax:         135.00 cm/s LVOT Vmean:        82.900 cm/s LVOT VTI:          0.346 m LVOT/AV VTI ratio: 1.01  AORTA Ao Root diam: 3.40 cm Ao Asc diam:  3.30 cm MITRAL VALVE                TRICUSPID VALVE MV Area (PHT): 3.83 cm     TR Peak grad:   26.4 mmHg MV Area VTI:   2.74 cm     TR Vmax:        257.00 cm/s MV Peak grad:  7.2 mmHg MV Mean grad:  2.0 mmHg     SHUNTS MV Vmax:       1.34 m/s     Systemic VTI:  0.35 m MV Vmean:      66.3 cm/s    Systemic Diam: 2.00 cm MV Decel Time: 198 msec MV E velocity: 80.70 cm/s MV A velocity: 125.00 cm/s MV E/A ratio:  0.65 Lorine Bears MD Electronically signed by Lorine Bears MD Signature Date/Time: 11/16/2023/12:42:17 PM    Final    (Echo, Carotid, EGD, Colonoscopy, ERCP)    Subjective: Pt c/o fatigue    Discharge Exam: Vitals:   11/17/23 0742 11/17/23 1146  BP:  (!) 165/68 (!) 147/52  Pulse: 73 69  Resp: 20   Temp: 98 F (36.7 C) 98.2 F (36.8 C)  SpO2: 94% 97%   Vitals:   11/16/23 2302 11/17/23 0347 11/17/23 0742 11/17/23 1146  BP: (!) 178/71 (!) 169/68 (!) 165/68 (!) 147/52  Pulse: 82 72 73 69  Resp: 20 20 20    Temp: 98 F (36.7 C) 98.4 F (36.9 C) 98 F (36.7 C) 98.2 F (36.8 C)  TempSrc:      SpO2: 97% 95% 94% 97%  Weight: 103.7 kg     Height:        General: Pt is alert, awake, not in acute distress Cardiovascular: S1/S2 +, no rubs, no gallops Respiratory: CTA bilaterally, no wheezing, no rhonchi Abdominal: Soft, NT,obese, bowel sounds + Extremities:no cyanosis    The results of significant diagnostics from this hospitalization (including imaging, microbiology, ancillary and laboratory) are listed below for reference.     Microbiology: No results found for this or any previous visit (from the past 240 hours).   Labs: BNP (last 3 results) No results for input(s): "BNP" in the last 8760 hours. Basic Metabolic Panel: Recent Labs  Lab 11/13/23 0939 11/14/23 0858 11/15/23 0445 11/16/23 0507 11/17/23 0609  NA 143 140  139 138 140  K 4.4 4.1 4.4 4.4 3.7  CL 110 109 107 106 106  CO2 24 22 22 24 25   GLUCOSE 103* 102* 92 137* 93  BUN 18 19 17 13 22   CREATININE 1.12* 1.25* 1.18* 1.03* 1.15*  CALCIUM 9.6 8.8* 8.7* 8.9 8.7*  MG  --   --   --  2.1 2.1  PHOS  --   --   --  3.3 2.5   Liver Function Tests: Recent Labs  Lab 11/13/23 0939 11/14/23 0858 11/16/23 0507 11/17/23 0609  AST 11 19 24 21   ALT 14 20 16 17   ALKPHOS  --  47 55 46  BILITOT 0.3 0.4 1.0 0.5  PROT 6.3 6.4* 6.6 6.3*  ALBUMIN  --  3.6 3.6 3.4*   No results for input(s): "LIPASE", "AMYLASE" in the last 168 hours. No results for input(s): "AMMONIA" in the last 168 hours. CBC: Recent Labs  Lab 11/13/23 0939 11/14/23 0858 11/14/23 2230 11/15/23 0445 11/16/23 0507 11/17/23 0609  WBC 5.7 6.1  --  7.8 8.3 14.6*  NEUTROABS 3,631 3.8  --   --   7.1 10.7*  HGB 5.1* 5.0* 7.7* 9.6* 9.1* 9.3*  HCT 18.9* 17.5* 24.7* 30.4* 28.8* 30.5*  MCV 80.8 78.8*  --  77.7* 78.3* 82.4  PLT 310 310  --  284 268 218   Cardiac Enzymes: No results for input(s): "CKTOTAL", "CKMB", "CKMBINDEX", "TROPONINI" in the last 168 hours. BNP: Invalid input(s): "POCBNP" CBG: No results for input(s): "GLUCAP" in the last 168 hours. D-Dimer No results for input(s): "DDIMER" in the last 72 hours. Hgb A1c No results for input(s): "HGBA1C" in the last 72 hours. Lipid Profile No results for input(s): "CHOL", "HDL", "LDLCALC", "TRIG", "CHOLHDL", "LDLDIRECT" in the last 72 hours. Thyroid function studies No results for input(s): "TSH", "T4TOTAL", "T3FREE", "THYROIDAB" in the last 72 hours.  Invalid input(s): "FREET3" Anemia work up Recent Labs    11/14/23 2307  VITAMINB12 372   Urinalysis    Component Value Date/Time   COLORURINE YELLOW (A) 11/16/2023 1703   APPEARANCEUR HAZY (A) 11/16/2023 1703   LABSPEC 1.006 11/16/2023 1703   PHURINE 5.0 11/16/2023 1703   GLUCOSEU NEGATIVE 11/16/2023 1703   HGBUR NEGATIVE 11/16/2023 1703   BILIRUBINUR NEGATIVE 11/16/2023 1703   KETONESUR NEGATIVE 11/16/2023 1703   PROTEINUR NEGATIVE 11/16/2023 1703   NITRITE NEGATIVE 11/16/2023 1703   LEUKOCYTESUR MODERATE (A) 11/16/2023 1703   Sepsis Labs Recent Labs  Lab 11/14/23 0858 11/15/23 0445 11/16/23 0507 11/17/23 0609  WBC 6.1 7.8 8.3 14.6*   Microbiology No results found for this or any previous visit (from the past 240 hours).   Time coordinating discharge: Over 30 minutes  SIGNED:   Charise Killian, MD  [facility] 11/17/2023, 1:20 PM Pager   If 7PM-7AM, please contact night-coverage [email or web address]

## 2023-11-17 NOTE — TOC Progression Note (Signed)
 Transition of Care [facility]) - Progression Note    Patient Details  Name: Virginia Walters MRN: [id number] Date of Birth: 1940/12/17  Transition of Care [facility]) CM/SW Contact  Truddie Hidden, RN Phone Number: 11/17/2023, 2:33 PM  Clinical Narrative:    TOC continuing to follow patient's progress throughout discharge planning.        Expected Discharge Plan and Services         Expected Discharge Date: 11/17/23                                     Social Determinants of Health (SDOH) Interventions SDOH Screenings   Food Insecurity: No Food Insecurity (11/15/2023)  Housing: Low Risk  (11/15/2023)  Transportation Needs: No Transportation Needs (11/15/2023)  Utilities: Not At Risk (11/15/2023)  Alcohol Screen: Low Risk  (05/25/2022)  Depression (PHQ2-9): Low Risk  (11/13/2023)  Financial Resource Strain: Low Risk  (05/25/2022)  Physical Activity: Inactive (05/25/2022)  Social Connections: Moderately Integrated (11/15/2023)  Stress: No Stress Concern Present (05/25/2022)  Tobacco Use: Medium Risk (11/17/2023)    Readmission Risk Interventions    09/20/2021   12:40 PM  Readmission Risk Prevention Plan  Transportation Screening Complete  PCP or Specialist Appt within 5-7 Days Complete  Home Care Screening Complete  Medication Review (RN CM) Complete

## 2023-11-17 NOTE — Telephone Encounter (Signed)
2nd attempt to reach pt regarding surgical clearance and the need for an IN OFFICE appointment.  Left pt a detailed message to call back and get that scheduled.

## 2023-11-20 ENCOUNTER — Telehealth: Payer: Self-pay

## 2023-11-20 ENCOUNTER — Ambulatory Visit
Admission: RE | Admit: 2023-11-20 | Discharge: 2023-11-20 | Disposition: A | Discharge status: Home / Self Care | Attending: Gastroenterology | Admitting: Gastroenterology

## 2023-11-20 ENCOUNTER — Encounter
Admission: RE | Disposition: A | Discharge status: Home / Self Care | Payer: Self-pay | Source: Home / Self Care | Attending: Gastroenterology

## 2023-11-20 DIAGNOSIS — D509 Iron deficiency anemia, unspecified: Secondary | ICD-10-CM | POA: Diagnosis not present

## 2023-11-20 DIAGNOSIS — Z5309 Procedure and treatment not carried out because of other contraindication: Secondary | ICD-10-CM | POA: Diagnosis not present

## 2023-11-20 HISTORY — PX: ESOPHAGOGASTRODUODENOSCOPY: SHX5428

## 2023-11-20 HISTORY — PX: COLONOSCOPY: SHX5424

## 2023-11-20 SURGERY — COLONOSCOPY
Anesthesia: General

## 2023-11-20 MED ORDER — SODIUM CHLORIDE 0.9 % IV SOLN: INTRAVENOUS | Status: DC

## 2023-11-20 MED ORDER — PEG 3350-KCL-NABCB-NACL-NASULF 236 G PO SOLR: ORAL | 0 refills | Status: DC

## 2023-11-20 NOTE — Transitions of Care (Post Inpatient/ED Visit) (Unsigned)
   11/20/2023  Name: Virginia Walters MRN: [id number] DOB: [birth year]  Today's TOC FU Call Status: Today's TOC FU Call Status:: Unsuccessful Call (1st Attempt) Unsuccessful Call (1st Attempt) Date: 11/20/23  Attempted to reach the patient regarding the most recent Inpatient/ED visit.  Follow Up Plan: Additional outreach attempts will be made to reach the patient to complete the Transitions of Care (Post Inpatient/ED visit) call.   Signature Karena Addison, LPN [facility] Nurse Health Advisor Direct Dial [phone]

## 2023-11-20 NOTE — Telephone Encounter (Signed)
 Pt has been scheduled to see Charlsie Quest, NP 11/27/23 for preop clearance.

## 2023-11-20 NOTE — Progress Notes (Signed)
 Patient did not get a prep. She will need to reschedule

## 2023-11-20 NOTE — Telephone Encounter (Signed)
 Stasia Cavalier (cousin) called regarding scheduling pt's procedure... OK per DPR... Instructions gone over with [name], she expressed understanding. I offered to print off a copy for them to pick up, she declined... I resent Rx to [facility] pharmacy  Nothing further needed

## 2023-11-21 ENCOUNTER — Encounter: Payer: Self-pay | Admitting: Gastroenterology

## 2023-11-21 NOTE — Transitions of Care (Post Inpatient/ED Visit) (Signed)
 11/21/2023  Name: Virginia Walters MRN: [id number] DOB: 07-Mar-1941  Today's TOC FU Call Status: Today's TOC FU Call Status:: Successful TOC FU Call Completed Unsuccessful Call (1st Attempt) Date: 11/20/23 [facility] FU Call Complete Date: 11/21/23 Patient's Name and Date of Birth confirmed.  Transition Care Management Follow-up Telephone Call Date of Discharge: 11/17/23 Discharge Facility: [facility]) Type of Discharge: Inpatient Admission How have you been since you were released from the hospital?: Better Any questions or concerns?: No  Items Reviewed: Did you receive and understand the discharge instructions provided?: Yes Medications obtained,verified, and reconciled?: Yes (Medications Reviewed) Any new allergies since your discharge?: No Dietary orders reviewed?: Yes Do you have support at home?: Yes People in Home [RPT]: other relative(s)  Medications Reviewed Today: Medications Reviewed Today     Reviewed by Karena Addison, LPN (Licensed Practical Nurse) on 11/21/23 at 1137  Med List Status: <None>   Medication Order Taking? Sig Documenting Provider Last Dose Status Informant  Ascorbic Acid (VITAMIN C PO) [id number] No Take 1 tablet by mouth daily. [provider] 11/13/2023 Morning Active Self, Pharmacy Records  BREZTRI AEROSPHERE 160-9-4.8 MCG/ACT AERO [id number] No USE 2 INHALATIONS BY MOUTH INTO  THE LUNGS IN THE MORNING AND AT  BEDTIME  Patient not taking: Reported on 11/14/2023   Donita Brooks, MD Not Taking Active Self, Pharmacy Records  dorzolamide (TRUSOPT) 2 % ophthalmic solution [id number] No Place 1 drop into the left eye 2 (two) times daily. [provider] 11/13/2023 Evening Active Self, Pharmacy Records           Med Note Jomarie Longs, [facility]   Thu Sep 16, 2021  4:55 PM)    doxazosin (CARDURA) 4 MG tablet [id number] No TAKE ONE-HALF TABLET BY MOUTH  DAILY Donita Brooks, MD 11/13/2023  8:00 AM Active Self, Pharmacy  Records  feeding supplement [facility] SURGERY) LIQD [id number] No Take 237 mLs by mouth 2 (two) times daily between meals. Narda Bonds, MD Taking Active Self, Pharmacy Records  fish oil-omega-3 fatty acids 1000 MG capsule [id number] No Take 1,000 mg by mouth daily. [provider] 11/13/2023 Morning Active Self, Pharmacy Records           Med Note [facility], MELISSA A   Tue Nov 14, 2023  5:07 PM)    lisinopril (ZESTRIL) 40 MG tablet [id number] No TAKE 1 TABLET BY MOUTH DAILY Donita Brooks, MD 11/13/2023  8:00 AM Active Self, Pharmacy Records  Multiple Vitamin (MULTIVITAMIN) tablet [id number] No Take 1 tablet by mouth daily. [provider] 11/13/2023 Morning Active Self, Pharmacy Records           Med Note Lenoria Farrier   Wed Oct 27, 2021 10:15 AM)    pantoprazole (PROTONIX) 40 MG tablet [id number]  Take 1 tablet (40 mg total) by mouth daily. Charise Killian, MD  Active   polyethylene glycol (GOLYTELY) 236 g solution [id number]  5pm the evening before, mix very well and drink 8 ounces every 20 minutes until (1/2) of the liquid has been completed.   On the day of procedure, 5 hours before, drink 8 ounces every 15 minutes until complete Midge Minium, MD  Active   pravastatin (PRAVACHOL) 40 MG tablet [id number] No Take 1 tablet (40 mg total) by mouth daily. 09/29/23-PT NEEDS APPOINTMENT Donita Brooks, MD 11/13/2023  8:00 AM Active Self, Pharmacy Records  solifenacin (VESICARE) 10 MG tablet [id number] No Take 1 tablet (10 mg total) by mouth daily.  Patient not  taking: Reported on 11/14/2023   Donita Brooks, MD Not Taking Active Self, Pharmacy Records  traZODone (DESYREL) 50 MG tablet [id number] No TAKE 1 TABLET BY MOUTH AT  BEDTIME Donita Brooks, MD 11/13/2023  6:00 PM Active Self, Pharmacy Records            Home Care and Equipment/Supplies: Were Home Health Services Ordered?: NA Any new equipment or medical supplies ordered?: NA  Functional Questionnaire: Do you  need assistance with bathing/showering or dressing?: No Do you need assistance with meal preparation?: No Do you need assistance with eating?: No Do you have difficulty maintaining continence: No Do you need assistance with getting out of bed/getting out of a chair/moving?: No Do you have difficulty managing or taking your medications?: No  Follow up appointments reviewed: PCP Follow-up appointment confirmed?: No MD Provider Line [phone] Given: No Specialist Hospital Follow-up appointment confirmed?: Yes Date of Specialist follow-up appointment?: 11/27/23 Follow-Up Specialty Provider:: GI Do you need transportation to your follow-up appointment?: No Do you understand care options if your condition(s) worsen?: Yes-patient verbalized understanding    SIGNATURE Karena Addison, LPN [facility] Nurse Health Advisor Direct Dial [phone]

## 2023-11-22 ENCOUNTER — Inpatient Hospital Stay: Admitting: Internal Medicine

## 2023-11-22 ENCOUNTER — Inpatient Hospital Stay

## 2023-11-27 ENCOUNTER — Encounter
Admission: RE | Disposition: A | Discharge status: Home / Self Care | Payer: Self-pay | Source: Home / Self Care | Attending: Gastroenterology

## 2023-11-27 ENCOUNTER — Ambulatory Visit: Admitting: General Practice

## 2023-11-27 ENCOUNTER — Other Ambulatory Visit: Payer: Self-pay

## 2023-11-27 ENCOUNTER — Ambulatory Visit
Admission: RE | Admit: 2023-11-27 | Discharge: 2023-11-27 | Disposition: A | Discharge status: Home / Self Care | Attending: Gastroenterology | Admitting: Gastroenterology

## 2023-11-27 ENCOUNTER — Ambulatory Visit: Attending: Cardiology | Admitting: Cardiology

## 2023-11-27 DIAGNOSIS — D509 Iron deficiency anemia, unspecified: Secondary | ICD-10-CM | POA: Insufficient documentation

## 2023-11-27 DIAGNOSIS — Z538 Procedure and treatment not carried out for other reasons: Secondary | ICD-10-CM | POA: Diagnosis not present

## 2023-11-27 HISTORY — PX: ESOPHAGOGASTRODUODENOSCOPY: SHX5428

## 2023-11-27 HISTORY — PX: COLONOSCOPY: SHX5424

## 2023-11-27 SURGERY — COLONOSCOPY
Anesthesia: General

## 2023-11-27 MED ORDER — PROPOFOL 1000 MG/100ML IV EMUL
INTRAVENOUS | Status: AC
  Filled 2023-11-27: qty 100

## 2023-11-27 MED ORDER — SODIUM CHLORIDE 0.9 % IV SOLN: INTRAVENOUS | Status: DC

## 2023-11-27 NOTE — Progress Notes (Deleted)
 Cardiology Office Note:  .   Date:  11/27/2023  ID:  Virginia Walters, DOB [birth year], MRN [id number] PCP: Austine Lefort, MD  [facility] Providers Cardiologist:  None Cardiology APP:  Florette Hurry, NP { Click to update primary MD,subspecialty MD or APP then REFRESH:1}   History of Present Illness: .   Virginia Walters is a 83 y.o. 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   She previously undergone echocardiogram in 07/2017 that showed an EF of 45-50%, mild, mildly dilated LV cavity size, mild diffuse hypokinesis, G1 DD, calcified mitral annulus with mild regurgitation, mildly dilated left atrium, normal RV systolic function and size, elevated RVSP estimated at 30-35 mmHg, and small pericardial effusion identified posterior to the heart.  Limited echocardiogram was repeated and again demonstrated an EF of 45-50% with mild LVH and trivial pericardial effusion that was stable to decrease when compared to prior study.  She underwent Lexiscan MPI in 10/2018 which showed no significant ischemia or scar with preserved LV systolic function of 61% and was overall low risk.  CT attenuation corrected imaging notable for coronary artery calcification and aortic atherosclerosis.  Unfortunately she was admitted to the hospital in 2021 with symptomatic anemia and hemoglobin of 6.1.  She denied symptoms of bleeding or change in bowel habits.  She was treated with 2 units of PRBCs.  EGD showed multiple nonbleeding gastric ulcerations.  She was evaluated in cardiology office in 10/2021 for preoperative  cardiac restratification for cholecystectomy and was without symptoms of angina or cardiac decompensation and no further testing was pursued at that time.  She presented to her PCPs office on 11/13/2023 reporting chronic low right sided back pain without sciatica with recommendation to initiate meloxicam.  To ensure NSAID was used to improve.  She underwent routine labs including a CBC and a CMP.  Hemoglobin came back low at 5.1 with a baseline between 10 and 12.  In the setting she was advised to present to the emergency department for further workup.  She was admitted to [facility] on 11/14/2023 with symptomatic anemia.  She denied any symptoms of chest pain, dyspnea, presyncope or syncope.  She did report intermittent dizziness that began on 11/08/2023.  Dizziness was not related to positional changes.  On arrival to [facility] anemia was confirmed with repeat CBC demonstrating hemoglobin of 5.0.  She was transfused 2 units of PRBCs and IV iron with a hemoglobin trending to 7.7.  On upwards of 9.6.  She denied any other symptoms concerning for bleeding including hematochezia or melena.  She was evaluated by hematology and GI who plan to proceed with outpatient EGD and colonoscopy 11/20/23.  Cardiology was consulted during her hospitalization she was seen by her team for cardiac restratification.  She was evaluated during the hospital and was determined that she could undergo her gastric procedures without any further cardiovascular testing being needed.  GI had opted to continue with outpatient workup.  Patient was considered stable and discharged home on 11/17/23.   She returns to clinic today  ROS: 10 point review of system has been reviewed and considered negative with exception was been listed in the  HPI  Studies Reviewed: .       2D echo 11/16/2023 1. Left ventricular ejection fraction, by estimation, is 55 to 60%. The  left ventricle has normal function. The left ventricle has no regional  wall motion abnormalities.  There is moderate left ventricular hypertrophy.  Left ventricular diastolic  parameters are consistent with Grade I diastolic dysfunction (impaired  relaxation). The average left ventricular global longitudinal strain is  -15.4 %. The global longitudinal strain is normal.   2. Right ventricular systolic function is normal. The right ventricular  size is normal. There is normal pulmonary artery systolic pressure.   3. Left atrial size was moderately dilated.   4. Right atrial size was mildly dilated.   5. The mitral valve is normal in structure. Mild mitral valve  regurgitation. No evidence of mitral stenosis.   6. The aortic valve is normal in structure. Aortic valve regurgitation is  trivial. Aortic valve sclerosis/calcification is present, without any  evidence of aortic stenosis.   7. The inferior vena cava is normal in size with greater than 50%  respiratory variability, suggesting right atrial pressure of 3 mmHg.   Lexiscan MPI 10/19/2018: Normal pharmacologic myocardial perfusion stress test without significant ischemia or scar. The left ventricular ejection fraction is normal by visual estimation and Siemens calculation(61%). Coronary artery and aortic atherosclerotic calcifications are noted. Attenuation correction CT is notable for a hiatal hernia and subcentemeter nodules in the right lung, incomplete characterized. Findings better characterized on dedicated chest CT from 08/14/2018. This is a low risk study.   Limited echo 07/31/2018: - Limited study to assess pericardial effusion.  - Left ventricle: The cavity size was at the upper limits of    normal. Wall thickness was increased in a pattern of mild LVH.    Systolic function was mildly reduced. The estimated ejection    fraction was in the range of 45% to 50%. Diffuse hypokinesis.  - Pericardium, extracardiac: A trivial pericardial effusion was    identified. Effusion is stable to decreased in size compared with    prior  echo from 07/22/18.    2D echo 07/22/2018: - Left ventricle: The cavity size was mildly dilated. Wall    thickness was increased in a pattern of mild LVH. Systolic    function was mildly reduced. The estimated ejection fraction was    in the range of 45% to 50%. Mild diffuse hypokinesis. Regional    wall motion abnormalities cannot be excluded. Doppler parameters    are consistent with abnormal left ventricular relaxation (grade 1    diastolic dysfunction). Doppler parameters are consistent with    high ventricular filling pressure.  - Mitral valve: Mildly calcified annulus. Mildly thickened leaflets    . There was mild regurgitation.  - Left atrium: The atrium was mildly dilated.  - Right ventricle: The cavity size was normal. Wall thickness was    mildly increased. Systolic function was normal.  - Pulmonary arteries: Systolic pressure was mildly increased, in    the range of 30 mm Hg to 35 mm Hg plus central venous/right    atrial pressure.  - Pericardium, extracardiac: A small pericardial effusion was    identified posterior to the heart.    Risk Assessment/Calculations:     No BP recorded.  {Refresh Note OR Click here to enter BP  :1}***       Physical Exam:   VS:  There were no vitals taken for this visit.   Wt Readings from Last  3 Encounters:  11/16/23 228 lb 11.2 oz (103.7 kg)  11/13/23 221 lb (100.2 kg)  12/06/22 219 lb (99.3 kg)    GEN: Well nourished, well developed in no acute distress NECK: No JVD; No carotid bruits CARDIAC: ***RRR, no murmurs, rubs, gallops RESPIRATORY:  Clear to auscultation without rales, wheezing or rhonchi  ABDOMEN: Soft, non-tender, non-distended EXTREMITIES:  No edema; No deformity   ASSESSMENT AND PLAN: .   Preprocedure cardiovascular examination/cardiac restratification HFmrEF Symptomatic anemia Coronary artery calcification Hypertension    {Are you ordering a CV Procedure (e.g. stress test, cath, DCCV, TEE, etc)?   Press F2         :[id number]}  Dispo: ***  Signed, Jos Cygan, NP

## 2023-11-27 NOTE — Telephone Encounter (Signed)
 The patient needs to reschedule her procedure because she didn't drink all the prep.

## 2023-11-28 ENCOUNTER — Encounter: Payer: Self-pay | Admitting: Gastroenterology

## 2023-11-28 NOTE — Telephone Encounter (Signed)
 Pt hs been rescheduled instructions emailed to [name] ok per [facility]

## 2023-11-30 ENCOUNTER — Encounter: Payer: Self-pay | Admitting: Gastroenterology

## 2023-11-30 ENCOUNTER — Other Ambulatory Visit: Payer: Self-pay

## 2023-11-30 MED ORDER — PEG 3350-KCL-NABCB-NACL-NASULF 236 G PO SOLR: ORAL | 0 refills | Status: DC

## 2023-12-01 ENCOUNTER — Encounter: Payer: Self-pay | Admitting: Emergency Medicine

## 2023-12-01 ENCOUNTER — Other Ambulatory Visit: Payer: Self-pay

## 2023-12-01 ENCOUNTER — Emergency Department

## 2023-12-01 ENCOUNTER — Inpatient Hospital Stay
Admission: EM | Admit: 2023-12-01 | Discharge: 2023-12-14 | DRG: 981 | Disposition: A | Discharge status: Skilled Nursing Facility | Attending: Internal Medicine | Admitting: Internal Medicine

## 2023-12-01 DIAGNOSIS — H547 Unspecified visual loss: Secondary | ICD-10-CM | POA: Diagnosis present

## 2023-12-01 DIAGNOSIS — E876 Hypokalemia: Secondary | ICD-10-CM | POA: Diagnosis present

## 2023-12-01 DIAGNOSIS — J449 Chronic obstructive pulmonary disease, unspecified: Secondary | ICD-10-CM | POA: Diagnosis not present

## 2023-12-01 DIAGNOSIS — M549 Dorsalgia, unspecified: Secondary | ICD-10-CM | POA: Diagnosis present

## 2023-12-01 DIAGNOSIS — K5730 Diverticulosis of large intestine without perforation or abscess without bleeding: Secondary | ICD-10-CM | POA: Diagnosis not present

## 2023-12-01 DIAGNOSIS — D649 Anemia, unspecified: Secondary | ICD-10-CM | POA: Diagnosis present

## 2023-12-01 DIAGNOSIS — Z886 Allergy status to analgesic agent status: Secondary | ICD-10-CM

## 2023-12-01 DIAGNOSIS — E785 Hyperlipidemia, unspecified: Secondary | ICD-10-CM | POA: Diagnosis not present

## 2023-12-01 DIAGNOSIS — J9601 Acute respiratory failure with hypoxia: Secondary | ICD-10-CM | POA: Diagnosis not present

## 2023-12-01 DIAGNOSIS — B9620 Unspecified Escherichia coli [E. coli] as the cause of diseases classified elsewhere: Secondary | ICD-10-CM | POA: Diagnosis present

## 2023-12-01 DIAGNOSIS — K449 Diaphragmatic hernia without obstruction or gangrene: Secondary | ICD-10-CM | POA: Diagnosis present

## 2023-12-01 DIAGNOSIS — I69398 Other sequelae of cerebral infarction: Secondary | ICD-10-CM

## 2023-12-01 DIAGNOSIS — R4182 Altered mental status, unspecified: Secondary | ICD-10-CM | POA: Diagnosis not present

## 2023-12-01 DIAGNOSIS — F1010 Alcohol abuse, uncomplicated: Secondary | ICD-10-CM | POA: Diagnosis not present

## 2023-12-01 DIAGNOSIS — E860 Dehydration: Secondary | ICD-10-CM | POA: Diagnosis not present

## 2023-12-01 DIAGNOSIS — N390 Urinary tract infection, site not specified: Principal | ICD-10-CM | POA: Diagnosis present

## 2023-12-01 DIAGNOSIS — Z79899 Other long term (current) drug therapy: Secondary | ICD-10-CM | POA: Diagnosis not present

## 2023-12-01 DIAGNOSIS — F03918 Unspecified dementia, unspecified severity, with other behavioral disturbance: Secondary | ICD-10-CM | POA: Diagnosis not present

## 2023-12-01 DIAGNOSIS — Z87891 Personal history of nicotine dependence: Secondary | ICD-10-CM | POA: Diagnosis not present

## 2023-12-01 DIAGNOSIS — I2694 Multiple subsegmental pulmonary emboli without acute cor pulmonale: Secondary | ICD-10-CM | POA: Diagnosis not present

## 2023-12-01 DIAGNOSIS — I5022 Chronic systolic (congestive) heart failure: Secondary | ICD-10-CM | POA: Diagnosis not present

## 2023-12-01 DIAGNOSIS — I723 Aneurysm of iliac artery: Secondary | ICD-10-CM | POA: Diagnosis not present

## 2023-12-01 DIAGNOSIS — Z743 Need for continuous supervision: Secondary | ICD-10-CM | POA: Diagnosis not present

## 2023-12-01 DIAGNOSIS — F1090 Alcohol use, unspecified, uncomplicated: Secondary | ICD-10-CM | POA: Diagnosis present

## 2023-12-01 DIAGNOSIS — R6889 Other general symptoms and signs: Secondary | ICD-10-CM | POA: Diagnosis not present

## 2023-12-01 DIAGNOSIS — E669 Obesity, unspecified: Secondary | ICD-10-CM | POA: Diagnosis present

## 2023-12-01 DIAGNOSIS — K641 Second degree hemorrhoids: Secondary | ICD-10-CM | POA: Diagnosis not present

## 2023-12-01 DIAGNOSIS — G9341 Metabolic encephalopathy: Secondary | ICD-10-CM

## 2023-12-01 DIAGNOSIS — I10 Essential (primary) hypertension: Secondary | ICD-10-CM | POA: Diagnosis not present

## 2023-12-01 DIAGNOSIS — H5461 Unqualified visual loss, right eye, normal vision left eye: Secondary | ICD-10-CM | POA: Diagnosis present

## 2023-12-01 DIAGNOSIS — I2699 Other pulmonary embolism without acute cor pulmonale: Secondary | ICD-10-CM

## 2023-12-01 DIAGNOSIS — D509 Iron deficiency anemia, unspecified: Secondary | ICD-10-CM | POA: Diagnosis not present

## 2023-12-01 DIAGNOSIS — Z800 Family history of malignant neoplasm of digestive organs: Secondary | ICD-10-CM

## 2023-12-01 DIAGNOSIS — I130 Hypertensive heart and chronic kidney disease with heart failure and stage 1 through stage 4 chronic kidney disease, or unspecified chronic kidney disease: Secondary | ICD-10-CM | POA: Diagnosis present

## 2023-12-01 DIAGNOSIS — Z7901 Long term (current) use of anticoagulants: Secondary | ICD-10-CM

## 2023-12-01 DIAGNOSIS — N179 Acute kidney failure, unspecified: Secondary | ICD-10-CM | POA: Diagnosis present

## 2023-12-01 DIAGNOSIS — R55 Syncope and collapse: Secondary | ICD-10-CM | POA: Diagnosis present

## 2023-12-01 DIAGNOSIS — D123 Benign neoplasm of transverse colon: Secondary | ICD-10-CM | POA: Diagnosis not present

## 2023-12-01 DIAGNOSIS — G8929 Other chronic pain: Secondary | ICD-10-CM | POA: Diagnosis present

## 2023-12-01 DIAGNOSIS — Z6834 Body mass index (BMI) 34.0-34.9, adult: Secondary | ICD-10-CM

## 2023-12-01 DIAGNOSIS — R000 Tachycardia, unspecified: Secondary | ICD-10-CM | POA: Diagnosis not present

## 2023-12-01 DIAGNOSIS — K2971 Gastritis, unspecified, with bleeding: Secondary | ICD-10-CM | POA: Diagnosis not present

## 2023-12-01 DIAGNOSIS — K922 Gastrointestinal hemorrhage, unspecified: Secondary | ICD-10-CM | POA: Diagnosis not present

## 2023-12-01 DIAGNOSIS — Z9049 Acquired absence of other specified parts of digestive tract: Secondary | ICD-10-CM

## 2023-12-01 DIAGNOSIS — E66811 Obesity, class 1: Secondary | ICD-10-CM | POA: Diagnosis present

## 2023-12-01 DIAGNOSIS — K5520 Angiodysplasia of colon without hemorrhage: Secondary | ICD-10-CM | POA: Diagnosis present

## 2023-12-01 DIAGNOSIS — Z87442 Personal history of urinary calculi: Secondary | ICD-10-CM

## 2023-12-01 DIAGNOSIS — Z885 Allergy status to narcotic agent status: Secondary | ICD-10-CM

## 2023-12-01 DIAGNOSIS — K219 Gastro-esophageal reflux disease without esophagitis: Secondary | ICD-10-CM | POA: Diagnosis present

## 2023-12-01 DIAGNOSIS — W19XXXA Unspecified fall, initial encounter: Secondary | ICD-10-CM | POA: Diagnosis not present

## 2023-12-01 DIAGNOSIS — R159 Full incontinence of feces: Secondary | ICD-10-CM | POA: Diagnosis present

## 2023-12-01 LAB — CBC WITH DIFFERENTIAL/PLATELET
Abs Immature Granulocytes: 0.01 10*3/uL (ref 0.00–0.07)
Basophils Absolute: 0.1 10*3/uL (ref 0.0–0.1)
Basophils Relative: 1 %
Eosinophils Absolute: 0.2 10*3/uL (ref 0.0–0.5)
Eosinophils Relative: 3 %
HCT: 34.6 % — ABNORMAL LOW (ref 36.0–46.0)
Hemoglobin: 10.5 g/dL — ABNORMAL LOW (ref 12.0–15.0)
Immature Granulocytes: 0 %
Lymphocytes Relative: 16 %
Lymphs Abs: 1 10*3/uL (ref 0.7–4.0)
MCH: 24.8 pg — ABNORMAL LOW (ref 26.0–34.0)
MCHC: 30.3 g/dL (ref 30.0–36.0)
MCV: 81.6 fL (ref 80.0–100.0)
Monocytes Absolute: 0.5 10*3/uL (ref 0.1–1.0)
Monocytes Relative: 8 %
Neutro Abs: 4.3 10*3/uL (ref 1.7–7.7)
Neutrophils Relative %: 72 %
Platelets: 236 10*3/uL (ref 150–400)
RBC: 4.24 MIL/uL (ref 3.87–5.11)
RDW: 20.3 % — ABNORMAL HIGH (ref 11.5–15.5)
WBC: 6 10*3/uL (ref 4.0–10.5)
nRBC: 0 % (ref 0.0–0.2)

## 2023-12-01 LAB — URINALYSIS, W/ REFLEX TO CULTURE (INFECTION SUSPECTED)
Glucose, UA: NEGATIVE mg/dL
Hgb urine dipstick: NEGATIVE
Ketones, ur: NEGATIVE mg/dL
Nitrite: NEGATIVE
Protein, ur: >=300 mg/dL — AB
Specific Gravity, Urine: 1.03 (ref 1.005–1.030)
WBC, UA: >50 WBC/hpf (ref 0–5)
pH: 5 (ref 5.0–8.0)

## 2023-12-01 LAB — COMPREHENSIVE METABOLIC PANEL WITH GFR
ALT: 21 U/L (ref 0–44)
AST: 26 U/L (ref 15–41)
Albumin: 3.9 g/dL (ref 3.5–5.0)
Alkaline Phosphatase: 49 U/L (ref 38–126)
Anion gap: 15 (ref 5–15)
BUN: 27 mg/dL — ABNORMAL HIGH (ref 8–23)
CO2: 22 mmol/L (ref 22–32)
Calcium: 9.5 mg/dL (ref 8.9–10.3)
Chloride: 106 mmol/L (ref 98–111)
Creatinine, Ser: 3.28 mg/dL — ABNORMAL HIGH (ref 0.44–1.00)
GFR, Estimated: 14 mL/min — ABNORMAL LOW (ref >60)
Glucose, Bld: 109 mg/dL — ABNORMAL HIGH (ref 70–99)
Potassium: 2.8 mmol/L — ABNORMAL LOW (ref 3.5–5.1)
Sodium: 143 mmol/L (ref 135–145)
Total Bilirubin: 1.4 mg/dL — ABNORMAL HIGH (ref 0.0–1.2)
Total Protein: 7.1 g/dL (ref 6.5–8.1)

## 2023-12-01 LAB — BLOOD GAS, VENOUS
Acid-Base Excess: 2.3 mmol/L — ABNORMAL HIGH (ref 0.0–2.0)
Bicarbonate: 24.7 mmol/L (ref 20.0–28.0)
O2 Saturation: 98.4 %
Patient temperature: 37
pCO2, Ven: 31 mmHg — ABNORMAL LOW (ref 44–60)
pH, Ven: 7.51 — ABNORMAL HIGH (ref 7.25–7.43)
pO2, Ven: 76 mmHg — ABNORMAL HIGH (ref 32–45)

## 2023-12-01 LAB — URINE DRUG SCREEN, QUALITATIVE (ARMC ONLY)
Amphetamines, Ur Screen: NOT DETECTED
Barbiturates, Ur Screen: NOT DETECTED
Benzodiazepine, Ur Scrn: NOT DETECTED
Cannabinoid 50 Ng, Ur ~~LOC~~: NOT DETECTED
Cocaine Metabolite,Ur ~~LOC~~: NOT DETECTED
MDMA (Ecstasy)Ur Screen: NOT DETECTED
Methadone Scn, Ur: NOT DETECTED
Opiate, Ur Screen: NOT DETECTED
Phencyclidine (PCP) Ur S: NOT DETECTED
Tricyclic, Ur Screen: NOT DETECTED

## 2023-12-01 LAB — LACTIC ACID, PLASMA
Lactic Acid, Venous: 1.4 mmol/L (ref 0.5–1.9)
Lactic Acid, Venous: 1.3 mmol/L (ref 0.5–1.9)

## 2023-12-01 LAB — ETHANOL: Alcohol, Ethyl (B): <10 mg/dL (ref <10)

## 2023-12-01 LAB — TROPONIN I (HIGH SENSITIVITY)
Troponin I (High Sensitivity): 46 ng/L — ABNORMAL HIGH (ref <18)
Troponin I (High Sensitivity): 44 ng/L — ABNORMAL HIGH (ref <18)

## 2023-12-01 MED ORDER — PANTOPRAZOLE SODIUM 40 MG PO TBEC
40.0000 mg | DELAYED_RELEASE_TABLET | Freq: Every day | ORAL | Status: DC
  Administered 2023-12-02 – 2023-12-14 (×12): 40 mg via ORAL
  Filled 2023-12-01 (×13): qty 1

## 2023-12-01 MED ORDER — ONDANSETRON HCL 4 MG PO TABS: 4.0000 mg | ORAL_TABLET | Freq: Four times a day (QID) | ORAL | Status: DC | PRN

## 2023-12-01 MED ORDER — POTASSIUM CHLORIDE 10 MEQ/100ML IV SOLN
10.0000 meq | INTRAVENOUS | Status: AC
  Administered 2023-12-01 (×2): 10 meq via INTRAVENOUS
  Filled 2023-12-01: qty 100

## 2023-12-01 MED ORDER — POTASSIUM CHLORIDE 20 MEQ PO PACK
40.0000 meq | PACK | Freq: Once | ORAL | Status: AC
  Administered 2023-12-01: 40 meq via ORAL
  Filled 2023-12-01: qty 2

## 2023-12-01 MED ORDER — SODIUM CHLORIDE 0.9 % IV SOLN: INTRAVENOUS | Status: AC

## 2023-12-01 MED ORDER — ACETAMINOPHEN 325 MG PO TABS: 650.0000 mg | ORAL_TABLET | Freq: Four times a day (QID) | ORAL | Status: DC | PRN

## 2023-12-01 MED ORDER — ONDANSETRON HCL 4 MG/2ML IJ SOLN: 4.0000 mg | Freq: Four times a day (QID) | INTRAMUSCULAR | Status: DC | PRN

## 2023-12-01 MED ORDER — ACETAMINOPHEN 650 MG RE SUPP: 650.0000 mg | Freq: Four times a day (QID) | RECTAL | Status: DC | PRN

## 2023-12-01 MED ORDER — OXYCODONE HCL 5 MG PO TABS: 5.0000 mg | ORAL_TABLET | ORAL | Status: DC | PRN

## 2023-12-01 MED ORDER — SODIUM CHLORIDE 0.9% FLUSH
3.0000 mL | Freq: Two times a day (BID) | INTRAVENOUS | Status: DC
  Administered 2023-12-02 – 2023-12-14 (×23): 3 mL via INTRAVENOUS

## 2023-12-01 MED ORDER — LACTATED RINGERS IV BOLUS
1000.0000 mL | Freq: Once | INTRAVENOUS | Status: AC
  Administered 2023-12-01: 1000 mL via INTRAVENOUS

## 2023-12-01 NOTE — Assessment & Plan Note (Addendum)
 Presumed GI.  Had recent transfusion of 3 units PRBCs and Venofer  Keeping n.p.o. in case GI wants to proceed with colonoscopy GI consulted

## 2023-12-01 NOTE — H&P (Signed)
 History and Physical    [name] FMW:[id number] DOB: 1941/07/21 DOA: 12/01/2023 DOS: the patient was seen and examined on 12/01/2023 PCP: Duanne Butler DASEN, MD  Patient coming from: Home  Chief Complaint:  Chief Complaint  Patient presents with   Altered Mental Status    HPI: Virginia Walters is a 83 y.o. female with medical history significant for HTN, HLD, COPD, HFpEF( EF 55-60% 11/16/2023), alcohol use disorder, chronic back pain, neuropathy, recently admitted from 4/1 to 4/4 with symptomatic anemia requiring 3 units PRBCs, complicated by acute encephalopathy attributed to delirium versus alcohol abuse versus baseline dementia, referred for outpatient GI workup and IV iron  with hematology, with good being admitted with a syncopal episode that occurred during the course of a colonoscopy prep. Tomorrow.  Patient was found sitting outside confused.  Per triage note, there was evidence of a trail of fecal incontinence from where she was sitting to inside the house and within the house.  Patient is currently oriented to person and place and is not sure about the circumstances but states she must have passed out. ED course and data review:Vitals within normal limits. Labs notable for AKI with creatinine 3.28 up from 1.03 a couple weeks prior. Hemoglobin 10.5 up from 9.3 a couple weeks aago. K+ 2.8 WBC and lactic acid normal Etoh <10 EKG with NSR at 67 CT head non acute     Past Medical History:  Diagnosis Date   (HFpEF) heart failure with preserved ejection fraction (HCC)    a.) TTE 07/22/2018: EF 45-50%; mild LVH; mild diffuse HK; mild LA dilation; PASP 30-35 mmHg; G1DD.   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   Weakness    Past Surgical History:  Procedure Laterality Date   ANKLE ARTHROSCOPY WITH OPEN REDUCTION INTERNAL FIXATION (ORIF) Right    APPENDECTOMY     CHOLECYSTECTOMY     COLONOSCOPY N/A 07/21/2018   Procedure: COLONOSCOPY;  Surgeon: Unk Corinn Skiff, MD;  Location: [facility] ENDOSCOPY;  Service: Gastroenterology;  Laterality: N/A;   COLONOSCOPY N/A 11/20/2023   Procedure: COLONOSCOPY;  Surgeon: Jinny Carmine, MD;  Location: [facility] ENDOSCOPY;  Service: Endoscopy;  Laterality: N/A;   COLONOSCOPY N/A 11/27/2023   Procedure: COLONOSCOPY;  Surgeon: Jinny Carmine, MD;  Location: [facility] ENDOSCOPY;  Service: Endoscopy;  Laterality: N/A;   COLONOSCOPY WITH PROPOFOL  N/A 04/01/2020   Procedure: COLONOSCOPY WITH PROPOFOL ;  Surgeon: Janalyn Keene NOVAK, MD;  Location: [facility] ENDOSCOPY;  Service: Endoscopy;  Laterality: N/A;   ESOPHAGOGASTRODUODENOSCOPY N/A 07/21/2018   Procedure: ESOPHAGOGASTRODUODENOSCOPY (EGD);  Surgeon: Unk Corinn Skiff, MD;  Location: [facility] ENDOSCOPY;  Service: Gastroenterology;  Laterality: N/A;   ESOPHAGOGASTRODUODENOSCOPY N/A 02/11/2020   Procedure: ESOPHAGOGASTRODUODENOSCOPY (EGD);  Surgeon: Janalyn Keene NOVAK, MD;  Location: [facility] ENDOSCOPY;  Service: Endoscopy;  Laterality: N/A;   ESOPHAGOGASTRODUODENOSCOPY N/A 11/20/2023    Procedure: EGD (ESOPHAGOGASTRODUODENOSCOPY);  Surgeon: Jinny Carmine, MD;  Location: [facility] ENDOSCOPY;  Service: Endoscopy;  Laterality: N/A;   ESOPHAGOGASTRODUODENOSCOPY N/A 11/27/2023   Procedure: EGD (ESOPHAGOGASTRODUODENOSCOPY);  Surgeon: Jinny Carmine, MD;  Location: [facility] ENDOSCOPY;  Service: Endoscopy;  Laterality: N/A;   ESOPHAGOGASTRODUODENOSCOPY (EGD) WITH PROPOFOL  N/A 04/01/2020   Procedure: ESOPHAGOGASTRODUODENOSCOPY (EGD) WITH PROPOFOL ;  Surgeon: Janalyn Keene NOVAK, MD;  Location: [facility] ENDOSCOPY;  Service: Endoscopy;  Laterality: N/A;   FRACTURE SURGERY Left    leg fx   IR EXCHANGE BILIARY DRAIN  10/22/2021   IR PERC CHOLECYSTOSTOMY  09/17/2021   TUBAL LIGATION     Social History:  reports that she quit smoking about 42 years ago. Her smoking use included cigarettes. She started smoking about 57 years ago. She has a 15 pack-year smoking history. She has never used smokeless tobacco. She reports current alcohol use of about 1.0 standard drink of alcohol per week. She reports that she does not use drugs.  Allergies  Allergen Reactions   Ibuprofen     Oxycodone  Itching    Family History  Problem Relation Age of Onset   Esophageal cancer Father    Dementia Mother     Prior to Admission medications   Medication Sig Start Date End Date Taking? Authorizing Provider  Ascorbic Acid  (VITAMIN C  PO) Take 1 tablet by mouth daily.   Yes [provider]  dorzolamide  (TRUSOPT ) 2 % ophthalmic solution Place 1 drop into the left eye 2 (two) times daily.   Yes [provider]  doxazosin  (CARDURA ) 4 MG tablet TAKE ONE-HALF TABLET BY MOUTH  DAILY 10/04/23  Yes Duanne Butler DASEN, MD  fish oil-omega-3 fatty acids  1000 MG capsule Take 1,000 mg by mouth daily.   Yes [provider]  lisinopril  (ZESTRIL ) 40 MG tablet TAKE 1 TABLET BY MOUTH DAILY 10/04/23  Yes Duanne Butler DASEN, MD  Multiple Vitamin (MULTIVITAMIN) tablet Take 1 tablet by mouth daily.   Yes [provider]  pantoprazole  (PROTONIX ) 40 MG tablet Take 1 tablet (40 mg total) by mouth daily. 11/17/23 12/17/23 Yes Trudy Anthony HERO, MD  polyethylene glycol (GOLYTELY ) 236 g solution 5pm the evening before, mix very well and drink 8 ounces every 20 minutes until (1/2) of the liquid has been completed On the day of procedure, 5 hours before, drink 8 ounces every 15 minutes until complete 11/30/23  Yes Jinny Carmine, MD  pravastatin  (PRAVACHOL ) 40 MG tablet Take 1 tablet (40 mg total) by mouth daily. 09/29/23-PT NEEDS APPOINTMENT 09/29/23  Yes Duanne Butler DASEN, MD  solifenacin  (VESICARE ) 10 MG tablet Take 1 tablet (10 mg total) by mouth daily. 11/13/23  Yes Duanne Butler DASEN, MD  traZODone  (DESYREL ) 50 MG tablet TAKE 1 TABLET BY MOUTH AT  BEDTIME 10/20/23  Yes Duanne Butler DASEN, MD  BREZTRI  AEROSPHERE 160-9-4.8 MCG/ACT AERO USE 2 INHALATIONS BY MOUTH INTO  THE LUNGS IN THE MORNING AND AT  BEDTIME Patient not taking: Reported on 11/14/2023 12/07/21   Duanne Butler DASEN, MD  feeding supplement (ENSURE SURGERY) LIQD Take 237 mLs by mouth 2 (two) times daily between meals.  09/21/21   Briana Elgin LABOR, MD    Physical Exam: Vitals:   12/01/23 1743 12/01/23 1930 12/01/23 2050 12/01/23 2155  BP: (!) 156/65 118/83 (!) 144/97   Pulse:  73 77   Resp:  (!) 22 20   Temp:    99.4 F (37.4 C)  TempSrc:    Oral  SpO2:  100% 99%   Weight:      Height:       Physical Exam Vitals and nursing note reviewed.  Constitutional:      General: She is not in acute distress.    Comments: Confused  HENT:     Head: Normocephalic and atraumatic.  Cardiovascular:     Rate and Rhythm: Normal rate and regular rhythm.     Heart sounds: Normal heart sounds.  Pulmonary:     Effort: Pulmonary effort is normal.     Breath sounds: Normal breath sounds.  Abdominal:     Palpations: Abdomen is soft.     Tenderness: There is no abdominal tenderness.  Neurological:     General: No focal deficit present.     Mental Status: She is lethargic.      Labs on Admission: I have personally reviewed following labs and imaging studies  CBC: Recent Labs  Lab 12/01/23 1746  WBC 6.0  NEUTROABS 4.3  HGB 10.5*  HCT 34.6*  MCV 81.6  PLT 236   Basic Metabolic Panel: Recent Labs  Lab 12/01/23 1746  NA 143  K 2.8*  CL 106  CO2 22  GLUCOSE 109*  BUN 27*  CREATININE 3.28*  CALCIUM  9.5   GFR: Estimated Creatinine Clearance: 16.3 mL/min (A) (by C-G formula based on SCr of 3.28 mg/dL (H)). Liver Function Tests: Recent Labs  Lab 12/01/23 1746  AST 26  ALT 21  ALKPHOS 49  BILITOT 1.4*  PROT 7.1  ALBUMIN  3.9   No results for input(s): LIPASE, AMYLASE in the last 168 hours. No results for input(s): AMMONIA in the last 168 hours. Coagulation Profile: No results for input(s): INR, PROTIME in the last 168 hours. Cardiac Enzymes: No results for input(s): CKTOTAL, CKMB, CKMBINDEX, TROPONINI in the last 168 hours. BNP (last 3 results) No results for input(s): PROBNP in the last 8760 hours. HbA1C: No results for input(s): HGBA1C in the last 72 hours. CBG: No results for input(s): GLUCAP in the last 168 hours. Lipid Profile: No results for input(s): CHOL, HDL, LDLCALC, TRIG, CHOLHDL, LDLDIRECT in the last 72 hours. Thyroid  Function Tests: No results for input(s): TSH, T4TOTAL, FREET4, T3FREE, THYROIDAB in the last 72 hours. Anemia Panel: No results for input(s): VITAMINB12, FOLATE, FERRITIN, TIBC, IRON , RETICCTPCT in the last 72 hours. Urine analysis:    Component Value Date/Time   COLORURINE AMBER (A) 12/01/2023 2144   APPEARANCEUR TURBID (A) 12/01/2023 2144   LABSPEC 1.030 12/01/2023 2144   PHURINE 5.0 12/01/2023 2144   GLUCOSEU NEGATIVE 12/01/2023 2144   HGBUR NEGATIVE 12/01/2023 2144   BILIRUBINUR MODERATE (A) 12/01/2023 2144   KETONESUR NEGATIVE 12/01/2023 2144   PROTEINUR >=300 (A) 12/01/2023 2144   NITRITE NEGATIVE 12/01/2023 2144   LEUKOCYTESUR  MODERATE (A) 12/01/2023 2144    Radiological Exams on Admission: CT Head Wo Contrast Result Date: 12/01/2023 CLINICAL DATA:  Mental status change, unknown cause. Possible syncopal episode. EXAM: CT HEAD WITHOUT CONTRAST TECHNIQUE: Contiguous axial images were obtained from the base of the skull through the vertex without intravenous contrast. RADIATION DOSE REDUCTION: This exam was performed according to the departmental dose-optimization  program which includes automated exposure control, adjustment of the mA and/or kV according to patient size and/or use of iterative reconstruction technique. COMPARISON:  Head CT 07/22/2021. FINDINGS: Brain: No acute hemorrhage. Unchanged background of moderate chronic small-vessel disease. Gray-white differentiation is preserved. Unchanged 4 mm hyperdense lesion along the roof of the third ventricle (coronal image 31 series 4), consistent with a colloid cyst. No hydrocephalus or extra-axial collection. No mass effect or midline shift. Vascular: No hyperdense vessel or unexpected calcification. Skull: No calvarial fracture or suspicious bone lesion. Skull base is unremarkable. Sinuses/Orbits: No acute finding. Other: None. IMPRESSION: 1. No acute intracranial abnormality. 2. 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 No hydrocephalus. 3. Unchanged background of moderate chronic small-vessel disease. Electronically Signed   By: Ryan Chess M.D.   On: 12/01/2023 18:31     Data Reviewed: Relevant notes from primary care and specialist visits, past discharge summaries as available in EHR, including Care Everywhere. Prior diagnostic testing as pertinent to current admission diagnoses Updated medications and problem lists for reconciliation ED course, including vitals, labs, imaging, treatment and response to treatment Triage notes, nursing and pharmacy notes and ED provider's notes Notable results as noted in HPI   Assessment and Plan: *  Syncope Acute metabolic encephalopathy Secondary to dehydration likely related to bowel prep, in combination with possible alcohol use, possible symptomatic anemia IV fluid resuscitation Serial H&H Neurologic checks with fall and aspiration precautions Patient had recent cardiac clearance in preparation for colonoscopy so will hold off on further workup except for cardiac monitoring overnight  AKI (acute kidney injury) (HCC) Likely secondary to dehydration from bowel prep, possibly environmental as well as she was found sitting outdoors IV fluid resuscitation Monitor renal function and avoid nephrotoxins  Acute metabolic encephalopathy Dementia Delirium precautions  Symptomatic anemia Presumed GI.  Had recent transfusion of 3 units PRBCs and Venofer  Keeping n.p.o. in case GI wants to proceed with colonoscopy GI consulted  COPD (chronic obstructive pulmonary disease) (HCC) Not acutely exacerbated As needed albuterol   Chronic systolic heart failure (HCC) EF 55 to 60% with G1 DD.  History of systolic heart failure Hold lisinopril  due to AKI  Alcohol use disorder EtOH level undetectable CIWA withdrawal protocol  HTN (hypertension) Hold antihypertensives in the setting of dehydration     DVT prophylaxis: SCD  Consults: GI  Advance Care Planning:   Code Status: Prior   Family Communication: none  Disposition Plan: Back to previous home environment  Severity of Illness: The appropriate patient status for this patient is OBSERVATION. Observation status is judged to be reasonable and necessary in order to provide the required intensity of service to ensure the patient's safety. The patient's presenting symptoms, physical exam findings, and initial radiographic and laboratory data in the context of their medical condition is felt to place them at decreased risk for further clinical deterioration. Furthermore, it is anticipated that the patient will be medically stable for  discharge from the hospital within 2 midnights of admission.   Author: Delayne LULLA Solian, MD 12/01/2023 11:07 PM  For on call review [email or web address].

## 2023-12-01 NOTE — Assessment & Plan Note (Addendum)
 EF 55 to 60% with G1 DD.  History of systolic heart failure Hold lisinopril  due to AKI

## 2023-12-01 NOTE — ED Triage Notes (Signed)
 Patient to room via EMS.  Called out for possible syncopal episode, AMS.  Patient was found sitting outside for an unknown amount of time.  Patient is A & O X2 at present.  She has been incontinent of stool and urine.  Patient states that she has been doing a colonoscopy prep this day but does not remember what happened.  EMS reports a trail of stool throughout her house to the outside where she was sitting.  Patient denies pain at present.

## 2023-12-01 NOTE — Assessment & Plan Note (Signed)
 Likely secondary to dehydration from bowel prep, possibly environmental as well as she was found sitting outdoors IV fluid resuscitation Monitor renal function and avoid nephrotoxins

## 2023-12-01 NOTE — Assessment & Plan Note (Signed)
 Hold antihypertensives in the setting of dehydration

## 2023-12-01 NOTE — ED Provider Notes (Signed)
 [facility] Provider Note   Event Date/Time   First MD Initiated Contact with Patient 12/01/23 1705     (approximate) History  Altered Mental Status  HPI Virginia Walters is a 83 y.o. female with past medical history of hypertension, dyslipidemia, alcohol abuse, COPD, dementia, heart failure, and major depressive disorder who presents after being found down in her yard by EMS after possible syncopal episode.  Patient is only oriented to self and place at this time.  EMS found patient be incontinent of stool and urine.  Patient states that she had been doing a colonoscopy prep today but does not remember what happened this morning.  EMS reports a trail of stool throughout her house and outside where she was sitting.  Patient denies any pain at this time ROS: Patient currently denies any vision changes, tinnitus, difficulty speaking, facial droop, sore throat, chest pain, shortness of breath, abdominal pain, nausea/vomiting/diarrhea, dysuria, or weakness/numbness/paresthesias in any extremity   Physical Exam  Triage Vital Signs: ED Triage Vitals  Encounter Vitals Group     BP --      Systolic BP Percentile --      Diastolic BP Percentile --      Pulse Rate 12/01/23 1711 70     Resp 12/01/23 1711 20     Temp 12/01/23 1711 98.2 F (36.8 C)     Temp Source 12/01/23 1711 Oral     SpO2 12/01/23 1711 99 %     Weight 12/01/23 1726 227 lb 1.2 oz (103 kg)     Height 12/01/23 1726 5\' 7"  (1.702 m)     Head Circumference --      Peak Flow --      Pain Score 12/01/23 1725 0     Pain Loc --      Pain Education --      Exclude from Growth Chart --    Most recent vital signs: Vitals:   12/01/23 1711 12/01/23 1743  BP:  (!) 156/65  Pulse: 70   Resp: 20   Temp: 98.2 F (36.8 C)   SpO2: 99%    General: Awake, oriented to person and place CV:  Good peripheral perfusion.  Resp:  Normal effort.  Abd:  No distention.  Other:  Obese elderly Caucasian female resting  comfortably in no acute distress.  First-degree sunburn to anterior bilateral lower extremities ED Results / Procedures / Treatments  Labs (all labs ordered are listed, but only abnormal results are displayed) Labs Reviewed  COMPREHENSIVE METABOLIC PANEL WITH GFR - Abnormal; Notable for the following components:      Result Value   Potassium 2.8 (*)    Glucose, Bld 109 (*)    BUN 27 (*)    Creatinine, Ser 3.28 (*)    Total Bilirubin 1.4 (*)    GFR, Estimated 14 (*)    All other components within normal limits  CBC WITH DIFFERENTIAL/PLATELET - Abnormal; Notable for the following components:   Hemoglobin 10.5 (*)    HCT 34.6 (*)    MCH 24.8 (*)    RDW 20.3 (*)    All other components within normal limits  TROPONIN I (HIGH SENSITIVITY) - Abnormal; Notable for the following components:   Troponin I (High Sensitivity) 46 (*)    All other components within normal limits  TROPONIN I (HIGH SENSITIVITY) - Abnormal; Notable for the following components:   Troponin I (High Sensitivity) 44 (*)    All other components within normal  limits  LACTIC ACID, PLASMA  LACTIC ACID, PLASMA  ETHANOL  URINALYSIS, W/ REFLEX TO CULTURE (INFECTION SUSPECTED)  BLOOD GAS, VENOUS  URINE DRUG SCREEN, QUALITATIVE ([facility] ONLY)   EKG ED ECG REPORT I, Charleen Conn, the attending physician, personally viewed and interpreted this ECG. Date: 12/01/2023 EKG Time: 1743 Rate: 67 Rhythm: normal sinus rhythm QRS Axis: normal Intervals: normal ST/T Wave abnormalities: normal Narrative Interpretation: no evidence of acute ischemia RADIOLOGY ED MD interpretation: CT of the head without contrast interpreted by me shows no evidence of acute abnormalities including no intracerebral hemorrhage, obvious masses, or significant edema -Agree with radiology assessment Official radiology report(s): CT Head Wo Contrast Result Date: 12/01/2023 CLINICAL DATA:  Mental status change, unknown cause. Possible syncopal episode.  EXAM: CT HEAD WITHOUT CONTRAST TECHNIQUE: Contiguous axial images were obtained from the base of the skull through the vertex without intravenous contrast. RADIATION DOSE REDUCTION: This exam was performed according to the departmental dose-optimization program which includes automated exposure control, adjustment of the mA and/or kV according to patient size and/or use of iterative reconstruction technique. COMPARISON:  Head CT 07/22/2021. FINDINGS: Brain: No acute hemorrhage. Unchanged background of moderate chronic small-vessel disease. Gray-white differentiation is preserved. Unchanged 4 mm hyperdense lesion along the roof of the third ventricle (coronal image 31 series 4), consistent with a colloid cyst. No hydrocephalus or extra-axial collection. No mass effect or midline shift. Vascular: No hyperdense vessel or unexpected calcification. Skull: No calvarial fracture or suspicious bone lesion. Skull base is unremarkable. Sinuses/Orbits: No acute finding. Other: None. IMPRESSION: 1. No acute intracranial abnormality. 2. 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 No hydrocephalus. 3. Unchanged background of moderate chronic small-vessel disease. Electronically Signed   By: Audra Blend M.D.   On: 12/01/2023 18:31   PROCEDURES: Critical Care performed: No Procedures MEDICATIONS ORDERED IN ED: Medications  potassium chloride  (KLOR-CON ) packet 40 mEq (has no administration in time range)  potassium chloride  10 mEq in 100 mL IVPB (has no administration in time range)  lactated ringers  bolus 1,000 mL (1,000 mLs Intravenous New Bag/Given 12/01/23 1838)   IMPRESSION / MDM / ASSESSMENT AND PLAN / ED COURSE  I reviewed the triage vital signs and the nursing notes.                             The patient is on the cardiac monitor to evaluate for evidence of arrhythmia and/or significant heart rate changes. Patient's presentation is most consistent with acute presentation with  potential threat to life or bodily function. Patient presents for altered mental status of unknown origin  Will obtain medical workup and discuss with social work to try to obtain collateral information.  Given History, Physical, and Workup there is no overt concern for a dangerous emergent cause such as, but not limited to, CNS infection, severe Toxidrome, severe metabolic derangement, or stroke. Patient does show signs of AKI.  Have started empiric fluid bolus Disposition: Admit; the patient is suffering altered mental status that is persistent and therefore they will be admitted.   FINAL CLINICAL IMPRESSION(S) / ED DIAGNOSES   Final diagnoses:  Altered mental status, unspecified altered mental status type  Dehydration   Rx / DC Orders   ED Discharge Orders     None      Note:  This document was prepared using Dragon voice recognition software and may include unintentional dictation errors.   Dreamer Carillo  K, MD 12/01/23 2059

## 2023-12-01 NOTE — Assessment & Plan Note (Signed)
 Acute metabolic encephalopathy Secondary to dehydration likely related to bowel prep, in combination with possible alcohol use, possible symptomatic anemia IV fluid resuscitation Serial H&H Neurologic checks with fall and aspiration precautions Patient had recent cardiac clearance in preparation for colonoscopy so will hold off on further workup except for cardiac monitoring overnight

## 2023-12-01 NOTE — Assessment & Plan Note (Signed)
 Delirium precautions

## 2023-12-01 NOTE — Assessment & Plan Note (Signed)
 EtOH level undetectable CIWA withdrawal protocol

## 2023-12-01 NOTE — ED Notes (Signed)
 Assisted RN with changing pt and pulling pt up in the bed.

## 2023-12-01 NOTE — Assessment & Plan Note (Signed)
 Not acutely exacerbated As needed albuterol

## 2023-12-02 DIAGNOSIS — I2699 Other pulmonary embolism without acute cor pulmonale: Secondary | ICD-10-CM | POA: Diagnosis not present

## 2023-12-02 DIAGNOSIS — Z6834 Body mass index (BMI) 34.0-34.9, adult: Secondary | ICD-10-CM | POA: Diagnosis not present

## 2023-12-02 DIAGNOSIS — D509 Iron deficiency anemia, unspecified: Secondary | ICD-10-CM | POA: Diagnosis not present

## 2023-12-02 DIAGNOSIS — R918 Other nonspecific abnormal finding of lung field: Secondary | ICD-10-CM | POA: Diagnosis not present

## 2023-12-02 DIAGNOSIS — D631 Anemia in chronic kidney disease: Secondary | ICD-10-CM | POA: Diagnosis not present

## 2023-12-02 DIAGNOSIS — K635 Polyp of colon: Secondary | ICD-10-CM | POA: Diagnosis not present

## 2023-12-02 DIAGNOSIS — R4182 Altered mental status, unspecified: Secondary | ICD-10-CM | POA: Diagnosis not present

## 2023-12-02 DIAGNOSIS — M6281 Muscle weakness (generalized): Secondary | ICD-10-CM | POA: Diagnosis not present

## 2023-12-02 DIAGNOSIS — E785 Hyperlipidemia, unspecified: Secondary | ICD-10-CM | POA: Diagnosis not present

## 2023-12-02 DIAGNOSIS — I2510 Atherosclerotic heart disease of native coronary artery without angina pectoris: Secondary | ICD-10-CM | POA: Diagnosis not present

## 2023-12-02 DIAGNOSIS — K922 Gastrointestinal hemorrhage, unspecified: Secondary | ICD-10-CM | POA: Diagnosis not present

## 2023-12-02 DIAGNOSIS — I517 Cardiomegaly: Secondary | ICD-10-CM | POA: Diagnosis not present

## 2023-12-02 DIAGNOSIS — K2971 Gastritis, unspecified, with bleeding: Secondary | ICD-10-CM | POA: Diagnosis not present

## 2023-12-02 DIAGNOSIS — J449 Chronic obstructive pulmonary disease, unspecified: Secondary | ICD-10-CM | POA: Diagnosis not present

## 2023-12-02 DIAGNOSIS — D123 Benign neoplasm of transverse colon: Secondary | ICD-10-CM | POA: Diagnosis not present

## 2023-12-02 DIAGNOSIS — H409 Unspecified glaucoma: Secondary | ICD-10-CM | POA: Diagnosis not present

## 2023-12-02 DIAGNOSIS — K5730 Diverticulosis of large intestine without perforation or abscess without bleeding: Secondary | ICD-10-CM | POA: Diagnosis not present

## 2023-12-02 DIAGNOSIS — Z7901 Long term (current) use of anticoagulants: Secondary | ICD-10-CM | POA: Diagnosis not present

## 2023-12-02 DIAGNOSIS — E66811 Obesity, class 1: Secondary | ICD-10-CM | POA: Diagnosis present

## 2023-12-02 DIAGNOSIS — I5022 Chronic systolic (congestive) heart failure: Secondary | ICD-10-CM | POA: Diagnosis not present

## 2023-12-02 DIAGNOSIS — I723 Aneurysm of iliac artery: Secondary | ICD-10-CM | POA: Diagnosis not present

## 2023-12-02 DIAGNOSIS — R55 Syncope and collapse: Secondary | ICD-10-CM | POA: Diagnosis not present

## 2023-12-02 DIAGNOSIS — H547 Unspecified visual loss: Secondary | ICD-10-CM | POA: Diagnosis present

## 2023-12-02 DIAGNOSIS — N1831 Chronic kidney disease, stage 3a: Secondary | ICD-10-CM | POA: Diagnosis not present

## 2023-12-02 DIAGNOSIS — I10 Essential (primary) hypertension: Secondary | ICD-10-CM | POA: Diagnosis not present

## 2023-12-02 DIAGNOSIS — N390 Urinary tract infection, site not specified: Secondary | ICD-10-CM | POA: Diagnosis present

## 2023-12-02 DIAGNOSIS — I130 Hypertensive heart and chronic kidney disease with heart failure and stage 1 through stage 4 chronic kidney disease, or unspecified chronic kidney disease: Secondary | ICD-10-CM | POA: Diagnosis not present

## 2023-12-02 DIAGNOSIS — E669 Obesity, unspecified: Secondary | ICD-10-CM | POA: Diagnosis present

## 2023-12-02 DIAGNOSIS — F1010 Alcohol abuse, uncomplicated: Secondary | ICD-10-CM | POA: Diagnosis present

## 2023-12-02 DIAGNOSIS — J9601 Acute respiratory failure with hypoxia: Secondary | ICD-10-CM | POA: Diagnosis not present

## 2023-12-02 DIAGNOSIS — N179 Acute kidney failure, unspecified: Secondary | ICD-10-CM | POA: Diagnosis not present

## 2023-12-02 DIAGNOSIS — K219 Gastro-esophageal reflux disease without esophagitis: Secondary | ICD-10-CM | POA: Diagnosis not present

## 2023-12-02 DIAGNOSIS — R7989 Other specified abnormal findings of blood chemistry: Secondary | ICD-10-CM | POA: Diagnosis not present

## 2023-12-02 DIAGNOSIS — B9620 Unspecified Escherichia coli [E. coli] as the cause of diseases classified elsewhere: Secondary | ICD-10-CM | POA: Diagnosis present

## 2023-12-02 DIAGNOSIS — F03918 Unspecified dementia, unspecified severity, with other behavioral disturbance: Secondary | ICD-10-CM

## 2023-12-02 DIAGNOSIS — E876 Hypokalemia: Secondary | ICD-10-CM | POA: Diagnosis present

## 2023-12-02 DIAGNOSIS — R159 Full incontinence of feces: Secondary | ICD-10-CM | POA: Diagnosis present

## 2023-12-02 DIAGNOSIS — I2694 Multiple subsegmental pulmonary emboli without acute cor pulmonale: Secondary | ICD-10-CM | POA: Diagnosis not present

## 2023-12-02 DIAGNOSIS — E860 Dehydration: Secondary | ICD-10-CM | POA: Diagnosis present

## 2023-12-02 DIAGNOSIS — J40 Bronchitis, not specified as acute or chronic: Secondary | ICD-10-CM | POA: Diagnosis not present

## 2023-12-02 DIAGNOSIS — R0902 Hypoxemia: Secondary | ICD-10-CM | POA: Diagnosis not present

## 2023-12-02 DIAGNOSIS — G894 Chronic pain syndrome: Secondary | ICD-10-CM | POA: Diagnosis not present

## 2023-12-02 DIAGNOSIS — Z87891 Personal history of nicotine dependence: Secondary | ICD-10-CM | POA: Diagnosis not present

## 2023-12-02 DIAGNOSIS — I69398 Other sequelae of cerebral infarction: Secondary | ICD-10-CM | POA: Diagnosis not present

## 2023-12-02 DIAGNOSIS — I5033 Acute on chronic diastolic (congestive) heart failure: Secondary | ICD-10-CM | POA: Diagnosis not present

## 2023-12-02 DIAGNOSIS — D649 Anemia, unspecified: Secondary | ICD-10-CM | POA: Diagnosis not present

## 2023-12-02 DIAGNOSIS — K641 Second degree hemorrhoids: Secondary | ICD-10-CM | POA: Diagnosis not present

## 2023-12-02 DIAGNOSIS — K449 Diaphragmatic hernia without obstruction or gangrene: Secondary | ICD-10-CM | POA: Diagnosis not present

## 2023-12-02 DIAGNOSIS — G9341 Metabolic encephalopathy: Secondary | ICD-10-CM | POA: Diagnosis not present

## 2023-12-02 DIAGNOSIS — Z79899 Other long term (current) drug therapy: Secondary | ICD-10-CM | POA: Diagnosis not present

## 2023-12-02 LAB — BASIC METABOLIC PANEL WITH GFR
Anion gap: 9 (ref 5–15)
BUN: 29 mg/dL — ABNORMAL HIGH (ref 8–23)
CO2: 24 mmol/L (ref 22–32)
Calcium: 8.6 mg/dL — ABNORMAL LOW (ref 8.9–10.3)
Chloride: 110 mmol/L (ref 98–111)
Creatinine, Ser: 2.41 mg/dL — ABNORMAL HIGH (ref 0.44–1.00)
GFR, Estimated: 20 mL/min — ABNORMAL LOW (ref >60)
Glucose, Bld: 86 mg/dL (ref 70–99)
Potassium: 3 mmol/L — ABNORMAL LOW (ref 3.5–5.1)
Sodium: 143 mmol/L (ref 135–145)

## 2023-12-02 LAB — MAGNESIUM: Magnesium: 1.9 mg/dL (ref 1.7–2.4)

## 2023-12-02 LAB — GLUCOSE, CAPILLARY: Glucose-Capillary: 94 mg/dL (ref 70–99)

## 2023-12-02 MED ORDER — HYDRALAZINE HCL 20 MG/ML IJ SOLN
5.0000 mg | INTRAMUSCULAR | Status: AC | PRN
  Administered 2023-12-02 – 2023-12-03 (×2): 5 mg via INTRAVENOUS
  Filled 2023-12-02 (×2): qty 1

## 2023-12-02 MED ORDER — DOXAZOSIN MESYLATE 1 MG PO TABS
1.0000 mg | ORAL_TABLET | Freq: Once | ORAL | Status: AC
  Administered 2023-12-02: 1 mg via ORAL
  Filled 2023-12-02: qty 1

## 2023-12-02 MED ORDER — DOXAZOSIN MESYLATE 2 MG PO TABS
2.0000 mg | ORAL_TABLET | Freq: Every day | ORAL | Status: DC
  Administered 2023-12-03 – 2023-12-14 (×11): 2 mg via ORAL
  Filled 2023-12-02 (×12): qty 1

## 2023-12-02 MED ORDER — SODIUM CHLORIDE 0.9 % IV SOLN
1.0000 g | INTRAVENOUS | Status: DC
  Administered 2023-12-02 – 2023-12-04 (×3): 1 g via INTRAVENOUS
  Filled 2023-12-02 (×3): qty 10

## 2023-12-02 MED ORDER — IPRATROPIUM-ALBUTEROL 0.5-2.5 (3) MG/3ML IN SOLN
3.0000 mL | Freq: Four times a day (QID) | RESPIRATORY_TRACT | Status: DC | PRN
  Administered 2023-12-02 – 2023-12-13 (×13): 3 mL via RESPIRATORY_TRACT
  Filled 2023-12-02 (×13): qty 3

## 2023-12-02 NOTE — Progress Notes (Signed)
 Progress Note   [name] MVH:[id number] DOB: 03-Feb-1941 DOA: 12/01/2023     0 DOS: the patient was seen and examined on 12/02/2023   Brief hospital course: HPI on admission by Dr. Vallarie Gauze:  " Virginia Walters is a 83 y.o. female with medical history significant for HTN, HLD, COPD, HFpEF( EF 55-60% 11/16/2023), alcohol use disorder, chronic back pain, neuropathy, recently admitted from 4/1 to 4/4 with symptomatic anemia requiring 3 units PRBCs, complicated by acute encephalopathy attributed to delirium versus alcohol abuse versus baseline dementia, referred for outpatient GI workup and IV iron  with hematology, with good being admitted with a syncopal episode that occurred during the course of a colonoscopy prep. Tomorrow.  Patient was found sitting outside confused.  Per triage note, there was evidence of a trail of fecal incontinence from where she was sitting to inside the house and within the house.  Patient is currently oriented to person and place and is not sure about the circumstances but states she must have passed out. ED course and data review:Vitals within normal limits. Labs notable for AKI with creatinine 3.28 up from 1.03 a couple weeks prior. Hemoglobin 10.5 up from 9.3 a couple weeks aago. K+ 2.8 WBC and lactic acid normal Etoh <10 EKG with NSR at 67 CT head non acute "  Patient was admitted for further evaluation and management of acute metabolic encephalopathy and question of possible syncopal episode and AKI.  GI was consulted on admission, they do not plan for endoscopy before Monday, if acute issues are improved.   Further hospital course and management as outlined below.   4/19 -- Not UA appears grossly positive for UTI.  Started empiric IV Rocephin  and added on a urine culture.  Pt more alert but remains very confused, oriented only to self.   Assessment and Plan:  UTI - POA.  Not septic. --Added on urine culture --Start empiric IV Rocephin  --Monitor  fever curve, CBC  Possible Syncope - in setting of pt doing bowel prep which appears she started too early, since scope was scheduled for Monday Acute metabolic encephalopathy - due to UTI, ?dehydration, in combination with possible alcohol use --IV fluid resuscitation --Serial H&H --Neurologic checks with fall and aspiration precautions --Patient had recent cardiac clearance in preparation for colonoscopy --Telemetry monitoring, no further cardiac evaluation indicated at this time --Monitor --Delirium precautions   AKI (acute kidney injury) (HCC) Likely secondary to dehydration from bowel prep, possibly environmental as well as she was found sitting outdoors --IV fluid resuscitation --Monitor renal function and avoid nephrotoxins   Dementia,  --Delirium precautions   Symptomatic anemia Presumed GI.  Had recent transfusion of 3 units PRBCs and Venofer  --Full liquid diet per GI --GI consulted on admission, possible scope Monday    COPD (chronic obstructive pulmonary disease) (HCC) Not acutely exacerbated --As needed albuterol    Chronic systolic heart failure (HCC) EF 55 to 60% with G1 DD.  History of systolic heart failure --Hold lisinopril  due to AKI   Alcohol use disorder EtOH level undetectable --CIWA withdrawal protocol   HTN (hypertension) --Hold antihypertensives in the setting of dehydration      Subjective: Pt seen awake having breakfast, very confused, trying to use a spoon to open grape juice container.  She has no recollection of events prior to admission or why she is here. No family present during my visit.  Physical Exam: Vitals:   12/02/23 0453 12/02/23 0455 12/02/23 0500 12/02/23 0754  BP: (!) 158/102 (!) 155/118  Aaron Aas)  155/46  Pulse: 66 75  63  Resp:  (!) 28  16  Temp:  98.5 F (36.9 C)  97.7 F (36.5 C)  TempSrc:  Oral  Oral  SpO2: 98% 94%  92%  Weight:   97.8 kg   Height:       General exam: awake, alert, no acute distress, obese, very  confused HEENT: atraumatic, clear conjunctiva, anicteric sclera, moist mucus membranes, hearing grossly normal  Respiratory system: CTAB, no wheezes, rales or rhonchi, normal respiratory effort. Cardiovascular system: normal S1/S2, RRR, no JVD, murmurs, rubs, gallops,  no pedal edema.   Gastrointestinal system: soft, NT, ND, no HSM felt, +bowel sounds. Central nervous system: A&O x self. no gross focal neurologic deficits, normal speech Extremities: moves all, no edema, normal tone Skin: dry, intact, normal temperature Psychiatry: normal mood, congruent affect, very confused, abnormal judgment and insight   Data Reviewed:  Notable lab s--  K 3.0, BUN 29, Cr 3.28 >> 2.41, Ca 8.6  Family Communication: None present. Will attempt to call.  Disposition: Status is: Observation Remains admitted on IV antibiotics for Uti pending cultures, GI evaluation planned Monday   Planned Discharge Destination: Home    Time spent: 45 minutes  Author: Montey Apa, DO 12/02/2023 12:07 PM  For on call review [email or web address].

## 2023-12-02 NOTE — Assessment & Plan Note (Signed)
 Dementia Delirium precautions

## 2023-12-02 NOTE — Plan of Care (Signed)

## 2023-12-02 NOTE — Evaluation (Addendum)
 Occupational Therapy Evaluation Patient Details Name: Virginia Walters MRN: [id number] DOB: 08-22-1940 Today's Date: 12/02/2023   History of Present Illness   Pt. is an 83 y.o. female who was brough to the ER after being found sitting outside of her home. Pt. was admitted to [facility] with Anemia, and Acute Metabolic Encephalopathy attributed to Delirium, versus alcohol abuse, versus baseline Dementia. This admission comes after Pt has had a recent hospital admission. PMHx: HTN, neuropathy, chronic back pain, alcohol abuse, COPD, CHF     Clinical Impressions Pt. presents with weakness, decreased cognition with delayed processing/task initiation, and limited functional mobility which hinders her ability to efficiently complete basic ADL functioning. Pt. Resides at home alone. Pt. Was independent with ADLs. Pt. Has a house cleaner every 2 weeks. Pt. Reports being independent with meal preparation, managing medication, and driving prior to this most recent hospitalization.  Pt. was seen for multiple attempts to complete the OT initial evaluation this afternoon.  Upon arrival initially Pt. Was Wheezing, and had positioned herself down low in the bed with her feet hanging out the edge below the bedrail. Pt. Required MAxA to reposition upright in  bed to midline. SpO2 95%, HR 63 bpms. Nursing was in to assist Pt. With a breathing treatment. Pt. Also had multiple episodes of loose BM.  Pt. Required Total A for hygiene care following BM at bedlevel. MinA BSCommode transfers using a RW, Total assist with toilet hygiene care following BSCommode use.  Pt. Will  benefit from OT services for ADL training, A/E training, UE there. Ex. and Pt./caregiver education about cognitive compensatory strategies, home modification, and DME.       If plan is discharge home, recommend the following:   A lot of help with bathing/dressing/bathroom;Assistance with cooking/housework;A little help with walking and/or transfers      Functional Status Assessment   Patient has had a recent decline in their functional status and demonstrates the ability to make significant improvements in function in a reasonable and predictable amount of time.     Equipment Recommendations   None recommended by OT     Recommendations for Other Services         Precautions/Restrictions         Mobility Bed Mobility                    Transfers Overall transfer level: Needs assistance Equipment used: Rolling walker (2 wheels) Transfers: Bed to chair/wheelchair/BSC             General transfer comment: MinA with cues for hand placement      Balance                                           ADL either performed or assessed with clinical judgement   ADL Overall ADL's : Needs assistance/impaired     Grooming: Minimal assistance                   Toilet Transfer: Minimal assistance   Toileting- Clothing Manipulation and Hygiene: Total assistance               Vision Patient Visual Report: No change from baseline       Perception         Praxis         Pertinent Vitals/Pain Pain Assessment Pain Assessment: No/denies pain  Extremity/Trunk Assessment Upper Extremity Assessment Upper Extremity Assessment: Generalized weakness           Communication Communication Communication: No apparent difficulties   Cognition Arousal: Alert Behavior During Therapy: WFL for tasks assessed/performed Cognition: History of cognitive impairments                               Following commands:  (Delayed; requires increased time)       Cueing  General Comments   Cueing Techniques: Verbal cues      Exercises     Shoulder Instructions      Home Living Family/patient expects to be discharged to:: Private residence Living Arrangements: Alone Available Help at Discharge: Neighbor;Available PRN/intermittently Type of Home:  House Home Access: Stairs to enter Entrance Stairs-Number of Steps: 6 Entrance Stairs-Rails: Right Home Layout: One level     Bathroom Shower/Tub: Walk-in shower         Home Equipment: Agricultural consultant (2 wheels);Shower seat;Cane - single point;Grab bars - tub/shower          Prior Functioning/Environment                 ADLs Comments: Pt. reports independence with ADLs, and IADLs. Pt. Has assistance  with house cleaning every 2 weeks. Pt. reports that she still drives.    OT Problem List: Decreased strength;Decreased range of motion;Decreased activity tolerance;Impaired balance (sitting and/or standing);Decreased safety awareness;Decreased knowledge of use of DME or AE   OT Treatment/Interventions: Self-care/ADL training;Therapeutic exercise;Energy conservation;DME and/or AE instruction;Therapeutic activities      OT Goals(Current goals can be found in the care plan section)   Acute Rehab OT Goals Patient Stated Goal: To get better Time For Goal Achievement: 12/16/23 Potential to Achieve Goals: Fair   OT Frequency:  Min 2X/week    Co-evaluation              AM-PAC OT "6 Clicks" Daily Activity     Outcome Measure   Help from another person taking care of personal grooming?: A Little Help from another person toileting, which includes using toliet, bedpan, or urinal?: A Lot Help from another person bathing (including washing, rinsing, drying)?: A Lot Help from another person to put on and taking off regular upper body clothing?: A Little Help from another person to put on and taking off regular lower body clothing?: A Lot 6 Click Score: 12   End of Session Equipment Utilized During Treatment: Rolling walker (2 wheels)  Activity Tolerance: Patient tolerated treatment well Patient left: in bed;with bed alarm set;with call bell/phone within reach  OT Visit Diagnosis: Muscle weakness (generalized) (M62.81)                Time: 1405-1415 (10 min.),  1610-9604(54 min.), 0981-1914(7 min.)= 28 min. Total   Charges:  OT General Charges $OT Visit: 1 Visit OT Treatments $Self Care/Home Management : 23-37 mins  Duey Ghent, MS, OTR/L   Duey Ghent 12/02/2023, 4:53 PM

## 2023-12-02 NOTE — Progress Notes (Signed)
 Spoke with jennifer allen, patients daughter who lives in [place] . Per daughter she is the medical poa and has previously faxed POA paperwork. Daughter stated she was coming to [place] on Thursday but if we needed her sooner to please call and she will drive up sooner. Patient gave permission to give daughter information. Will continue to monitor.

## 2023-12-02 NOTE — Progress Notes (Signed)
   CROSS COVER NOTE  Patient name: CINTHIA RODDEN MRN: [id number] DOB : 1940/10/31  Concern as stated by nurse / staff   Tye Gall Mosteller is a 83 y.o. female with medical history significant for HTN, HLD, COPD, HFpEF( EF 55-60% 11/16/2023), alcohol use disorder, chronic back pain, neuropathy, recently admitted from 4/1 to 4/4 with symptomatic anemia requiring 3 units PRBCs, complicated by acute encephalopathy attributed to delirium versus alcohol abuse versus baseline dementia, referred for outpatient GI workup and IV iron  with hematology, with good being admitted with a syncopal episode that occurred during the course of a colonoscopy prep. Tomorrow. Patient was found sitting outside confused. Per triage note, there was evidence of a trail of fecal incontinence from where she was sitting to inside the house and within the house. patient is oriented to self and she is incontinent. she already urinate on the bed 2x soaked. can we get order to put purewick? thanks  also her BP now 184/62 after giving hydralazine     Review of Pertinent findings: Unsafe ambulation in setting of dehydration and syncopal episode with ongoing incontinence warrants external catheter placement overnight.  BP consistently elevated with systolics in 180s utilizing the PRN BP medications in holding of her home doxazosin   Assessment and  Interventions   Assessment:stable with significant BP elevation and contraindications to ambulation to bathroom overnight/   Plan: Ordered external catheter overnight and can be renewed as needed by attending tomorrow Restarted home doxazosin  as pressures are consistently elevated up to 180s systolics. Appears to be asymptomatic. This medication is at high risk of rebound hypertension when abruptly stopped.  Watch cautiously for postural hypotension        Additional: patient K+ 3.0 on 4/19 and presumably having significant K+ wasting with ongoing loose stools. Only had  replacement on 4/18 and none since then. - ordered 40mEq BID x2 doses. BMP for am ordered.

## 2023-12-02 NOTE — Consult Note (Signed)
 Inpatient Consultation   Patient ID: Virginia Walters is a 83 y.o. female.  Requesting Provider: Brion Cancel, MD  Date of Admission: 12/01/2023  Date of Consult: 12/02/23   Reason for Consultation: upcoming outpatient colonoscopy   Patient's Chief Complaint:   Chief Complaint  Patient presents with   Altered Mental Status    83 year old Caucasian female with HFpEF, history of alcohol abuse, COPD, CKD, GERD, FLD who presents to the hospital with acute encephalopathy.  GI is consulted due to her upcoming outpatient colonoscopy.  Patient is a poor historian.  Unclear if this is her mentation at baseline, but in review of recent hospitalization there is no mention of confusion so this seems more acute.  She notably has AKI and possible UTI with multiple other sedating/mentation changing medications which could be contributory.  History is mostly garnered via chart review.  Upon my evaluation the patient is able to tell me her name and that she is at [facility], but other questions such as date of birth age city state year she is not able to tell me.  Per H&P the patient was recently admitted from 4 1-4 4 with symptomatic anemia requiring 3 units of PRBCs.  She elected to undergo outpatient bidirectional endoscopy.  She was brought to the hospital after being found outside confused.  There was stool apparently where she was sitting and throughout the inside of her house.  Per H&P, the patient may have been drinking her colonoscopy prep although her schedule procedures are not until Monday.  When questioned about this the patient has no recollection.  Notably with AKI with creatinine of 3.28 from 1 from previous hospitalization.  Hemoglobin uptrending to 10.5 from 9.3 at last discharge.  No current signs of GI bleeding that we are aware of.  The patient denies any abdominal pain nausea or vomiting.  Denies NSAIDs, Anti-plt agents, and anticoagulants Denies family history of gastrointestinal  disease and malignancy Previous Endoscopies:  04/01/20 EGD and colonoscopy Gastritis and hiatal hernia otherwise unremarkable.  Negative for H. pylori 1 TA and 1 SSP.  Angiectasia also noted and treated with APC, pandiverticulosis with increased prevalence in the sigmoid     Past Medical History:  Diagnosis Date   (HFpEF) heart failure with preserved ejection fraction (HCC)    a.) TTE 07/22/2018: EF 45-50%; mild LVH; mild diffuse HK; mild LA dilation; PASP 30-35 mmHg; G1DD.   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   Weakness     Past Surgical History:  Procedure Laterality Date   ANKLE ARTHROSCOPY WITH OPEN REDUCTION INTERNAL FIXATION (ORIF) Right    APPENDECTOMY     CHOLECYSTECTOMY     COLONOSCOPY N/A 07/21/2018   Procedure: COLONOSCOPY;  Surgeon: Selena Daily, MD;  Location: [facility] ENDOSCOPY;  Service: Gastroenterology;  Laterality: N/A;   COLONOSCOPY N/A 11/20/2023   Procedure: COLONOSCOPY;  Surgeon: Marnee Sink, MD;  Location: [facility] ENDOSCOPY;  Service: Endoscopy;   Laterality: N/A;   COLONOSCOPY N/A 11/27/2023   Procedure: COLONOSCOPY;  Surgeon: Marnee Sink, MD;  Location: [facility] ENDOSCOPY;  Service: Endoscopy;  Laterality: N/A;   COLONOSCOPY WITH PROPOFOL  N/A 04/01/2020   Procedure: COLONOSCOPY WITH PROPOFOL ;  Surgeon: Irby Mannan, MD;  Location: [facility] ENDOSCOPY;  Service: Endoscopy;  Laterality: N/A;   ESOPHAGOGASTRODUODENOSCOPY N/A 07/21/2018   Procedure: ESOPHAGOGASTRODUODENOSCOPY (EGD);  Surgeon: Selena Daily, MD;  Location: [facility] ENDOSCOPY;  Service: Gastroenterology;  Laterality: N/A;   ESOPHAGOGASTRODUODENOSCOPY N/A 02/11/2020   Procedure: ESOPHAGOGASTRODUODENOSCOPY (EGD);  Surgeon: Irby Mannan, MD;  Location: [facility] ENDOSCOPY;  Service: Endoscopy;  Laterality: N/A;   ESOPHAGOGASTRODUODENOSCOPY N/A 11/20/2023   Procedure: EGD (ESOPHAGOGASTRODUODENOSCOPY);  Surgeon: Marnee Sink, MD;  Location: [facility] ENDOSCOPY;  Service: Endoscopy;  Laterality: N/A;   ESOPHAGOGASTRODUODENOSCOPY N/A 11/27/2023   Procedure: EGD (ESOPHAGOGASTRODUODENOSCOPY);  Surgeon: Marnee Sink, MD;  Location: [facility] ENDOSCOPY;  Service: Endoscopy;  Laterality: N/A;   ESOPHAGOGASTRODUODENOSCOPY (EGD) WITH PROPOFOL  N/A 04/01/2020   Procedure: ESOPHAGOGASTRODUODENOSCOPY (EGD) WITH PROPOFOL ;  Surgeon: Irby Mannan, MD;  Location: [facility] ENDOSCOPY;  Service: Endoscopy;  Laterality: N/A;   FRACTURE SURGERY Left    leg fx   IR EXCHANGE BILIARY DRAIN  10/22/2021   IR PERC CHOLECYSTOSTOMY  09/17/2021   TUBAL LIGATION      Allergies  Allergen Reactions   Ibuprofen     Oxycodone  Itching    Family History  Problem Relation Age of Onset   Esophageal cancer Father    Dementia Mother     Social History   Tobacco Use   Smoking status: Former    Current packs/day: 0.00    Average packs/day: 1 pack/day for 15.0 years (15.0 ttl pk-yrs)    Types: Cigarettes    Start date: 12/27/1965    Quit date: 12/27/1980    Years since quitting: 42.9   Smokeless tobacco:  Never  Vaping Use   Vaping status: Never Used  Substance Use Topics   Alcohol use: Yes    Alcohol/week: 1.0 standard drink of alcohol    Types: 1 Glasses of wine per week   Drug use: No     Pertinent GI related history and allergies were reviewed with the patient  Review of Systems  Unable to perform ROS: Mental status change     Medications Home Medications No current facility-administered medications on file prior to encounter.   Current Outpatient Medications on File Prior to Encounter  Medication Sig Dispense Refill   Ascorbic Acid  (VITAMIN C  PO) Take 1 tablet by mouth daily.     dorzolamide  (TRUSOPT ) 2 % ophthalmic solution Place 1 drop into the left eye 2 (two) times daily.     doxazosin  (CARDURA ) 4 MG tablet TAKE ONE-HALF TABLET BY MOUTH  DAILY 50 tablet 2   fish oil-omega-3 fatty acids  1000 MG capsule Take 1,000 mg by mouth daily.     lisinopril  (ZESTRIL ) 40 MG tablet TAKE 1 TABLET BY MOUTH DAILY 100 tablet 2   Multiple Vitamin (MULTIVITAMIN) tablet  Take 1 tablet by mouth daily.     pantoprazole  (PROTONIX ) 40 MG tablet Take 1 tablet (40 mg total) by mouth daily. 30 tablet 0   polyethylene glycol (GOLYTELY ) 236 g solution 5pm the evening before, mix very well and drink 8 ounces every 20 minutes until (1/2) of the liquid has been completed On the day of procedure, 5 hours before, drink 8 ounces every 15 minutes until complete 4000 mL 0   pravastatin  (PRAVACHOL ) 40 MG tablet Take 1 tablet (40 mg total) by mouth daily. 09/29/23-PT NEEDS APPOINTMENT 30 tablet 0   solifenacin  (VESICARE ) 10 MG tablet Take 1 tablet (10 mg total) by mouth daily. 90 tablet 3   traZODone  (DESYREL ) 50 MG tablet TAKE 1 TABLET BY MOUTH AT  BEDTIME 90 tablet 3   BREZTRI  AEROSPHERE 160-9-4.8 MCG/ACT AERO USE 2 INHALATIONS BY MOUTH INTO  THE LUNGS IN THE MORNING AND AT  BEDTIME (Patient not taking: Reported on 11/14/2023) 32.1 g 3   feeding supplement (ENSURE SURGERY) LIQD Take 237 mLs by mouth 2 (two) times  daily between meals.     Pertinent GI related medications were reviewed with the patient  Inpatient Medications  Current Facility-Administered Medications:    0.9 %  sodium chloride  infusion, , Intravenous, Continuous, Lanetta Pion, MD, Stopped at 12/02/23 6063   acetaminophen  (TYLENOL ) tablet 650 mg, 650 mg, Oral, Q6H PRN **OR** acetaminophen  (TYLENOL ) suppository 650 mg, 650 mg, Rectal, Q6H PRN, Lanetta Pion, MD   hydrALAZINE  (APRESOLINE ) injection 5 mg, 5 mg, Intravenous, Q4H PRN, Lanetta Pion, MD   ondansetron  (ZOFRAN ) tablet 4 mg, 4 mg, Oral, Q6H PRN **OR** ondansetron  (ZOFRAN ) injection 4 mg, 4 mg, Intravenous, Q6H PRN, Lanetta Pion, MD   oxyCODONE  (Oxy IR/ROXICODONE ) immediate release tablet 5 mg, 5 mg, Oral, Q4H PRN, Lanetta Pion, MD   pantoprazole  (PROTONIX ) EC tablet 40 mg, 40 mg, Oral, Daily, Vallarie Gauze, Hazel V, MD   sodium chloride  flush (NS) 0.9 % injection 3 mL, 3 mL, Intravenous, Q12H, Lanetta Pion, MD  sodium chloride  Stopped (12/02/23 0160)    acetaminophen  **OR** acetaminophen , hydrALAZINE , ondansetron  **OR** ondansetron  (ZOFRAN ) IV, oxyCODONE    Objective   Vitals:   12/02/23 0100 12/02/23 0453 12/02/23 0455 12/02/23 0500  BP:  (!) 158/102 (!) 155/118   Pulse: 72 66 75   Resp:   (!) 28   Temp:   98.5 F (36.9 C)   TempSrc:   Oral   SpO2:  98% 94%   Weight:    97.8 kg  Height:         Physical Exam Vitals and nursing note reviewed.  Constitutional:      General: She is not in acute distress.    Appearance: She is ill-appearing. She is not toxic-appearing or diaphoretic.  HENT:     Head: Normocephalic and atraumatic.     Nose: Nose normal.     Mouth/Throat:     Mouth: Mucous membranes are moist.     Pharynx: Oropharynx is clear.  Eyes:     General: No scleral icterus.    Extraocular Movements: Extraocular movements intact.  Cardiovascular:     Rate and Rhythm: Normal rate and regular rhythm.     Heart sounds: Normal heart sounds. No  murmur heard.    No friction rub. No gallop.  Pulmonary:     Effort: Pulmonary effort is normal. No respiratory distress.     Breath sounds: Normal breath sounds. No wheezing, rhonchi or rales.  Abdominal:  General: Bowel sounds are normal. There is no distension.     Palpations: Abdomen is soft.     Tenderness: There is no abdominal tenderness. There is no guarding or rebound.  Musculoskeletal:     Cervical back: Neck supple.  Skin:    General: Skin is warm and dry.     Coloration: Skin is not jaundiced or pale.  Neurological:     Mental Status: She is alert.     Comments: Altered. AxO to person and hospital- no other place/time/circumstances  Psychiatric:     Comments: Acute encephalopathy     Laboratory Data Recent Labs  Lab 12/01/23 1746  WBC 6.0  HGB 10.5*  HCT 34.6*  PLT 236   Recent Labs  Lab 12/01/23 1746  NA 143  K 2.8*  CL 106  CO2 22  BUN 27*  CALCIUM  9.5  PROT 7.1  BILITOT 1.4*  ALKPHOS 49  ALT 21  AST 26  GLUCOSE 109*   No results for input(s): "INR" in the last 168 hours.  No results for input(s): "LIPASE" in the last 72 hours.      Imaging Studies: CT Head Wo Contrast Result Date: 12/01/2023 CLINICAL DATA:  Mental status change, unknown cause. Possible syncopal episode. EXAM: CT HEAD WITHOUT CONTRAST TECHNIQUE: Contiguous axial images were obtained from the base of the skull through the vertex without intravenous contrast. RADIATION DOSE REDUCTION: This exam was performed according to the departmental dose-optimization program which includes automated exposure control, adjustment of the mA and/or kV according to patient size and/or use of iterative reconstruction technique. COMPARISON:  Head CT 07/22/2021. FINDINGS: Brain: No acute hemorrhage. Unchanged background of moderate chronic small-vessel disease. Gray-white differentiation is preserved. Unchanged 4 mm hyperdense lesion along the roof of the third ventricle (coronal image 31 series 4),  consistent with a colloid cyst. No hydrocephalus or extra-axial collection. No mass effect or midline shift. Vascular: No hyperdense vessel or unexpected calcification. Skull: No calvarial fracture or suspicious bone lesion. Skull base is unremarkable. Sinuses/Orbits: No acute finding. Other: None. IMPRESSION: 1. No acute intracranial abnormality. 2. 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 No hydrocephalus. 3. Unchanged background of moderate chronic small-vessel disease. Electronically Signed   By: Audra Blend M.D.   On: 12/01/2023 18:31    Assessment:   # Acute encephalopathy  - uti - multiple mentation affecting medications with additional AKI - unclear if she was drinking prep prior to episode. Given that she doesn't remember drinking any, suspect encephalopathy was prior  # AKI  # hypokalemia  #UTI  # Anemia - being w/u; initially planned for bidirectional endoscopy on Monday - hgb improved from last discharge - no signs of gib  # colonic avm  # colon diverticulosis  # COPD, HFpEF, HTN   Plan:   ABX as per primary team If mentation and aki improves, can consider bidirectional endoscopy Monday, but will monitor and postpone if indicated Continue home daily ppi Avoid sedating medications that can affect mentation Ok for full liquid diet at this time if swallowing is not impacted by confusion Monitor H&H.  Transfusion and resuscitation as per primary team Avoid frequent lab draws to prevent lab induced anemia Supportive care and antiemetics as per primary team Maintain two sites IV access Avoid nsaids Monitor for GIB.  Management of other medical comorbidities as per primary team  I personally performed the service.  Thank you for allowing us  to participate in this patient's care. Please  don't hesitate to call if any questions or concerns arise.   Quintin Buckle, DO [facility]  Portions of the record may  have been created with voice recognition software. Occasional wrong-word or 'sound-a-like' substitutions may have occurred due to the inherent limitations of voice recognition software.  Read the chart carefully and recognize, using context, where substitutions may have occurred.

## 2023-12-02 NOTE — Progress Notes (Signed)
 Patient admitted to RM 236. Patient Aox2. BP 181/105, recheck 169/66 (94). HR 72. spO2 98% RA. Provider made aware of BP. Dual RN skin assessment done. PIVx1 IV fluids started.  Call bell provided, Oriented to standard equipment, Bed alarm on and to lowest position.

## 2023-12-02 NOTE — Evaluation (Signed)
 Physical Therapy Evaluation Patient Details Name: Virginia Walters MRN: [id number] DOB: [birth year] Today's Date: 12/02/2023  History of Present Illness  Pt is an 83 y.o. female presenting to hospital 12/01/23 after being found down in her yard by EMS after possible syncopal episode; incontinent of stool and urine; pt was doing colonoscopy prep.  Pt admitted with UTI, possible syncope, acute metabolic encephalopathy, AKI, symptomatic anemia.  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  Recent hospitalization 4/1 to 11/17/23 with symptomatic anemia requiring 3 units PRBC's complicated by acute encephalopathy.  Clinical Impression  Prior to recent medical concerns, pt reports being ambulatory (uses RW in house and [facility] in community); lives alone.  No c/o pain during session.  Currently pt is min to mod assist with bed mobility; min assist with sit to stand transfers; and CGA stand step turn (with RW) bed to/from [facility] for toileting (pt noted with BM incontinence during session requiring assist for clean-up/peri-care, new bed linen placed, and pt changed into clean gown).  Pt would currently benefit from skilled PT to address noted impairments and functional limitations (see below for any additional details).  Upon hospital discharge, pt would benefit from ongoing therapy.     If plan is discharge home, recommend the following: A little help with walking and/or transfers;A little help with bathing/dressing/bathroom;Assistance with cooking/housework;Assist for transportation;Help with stairs or ramp for entrance   Can travel by private vehicle        Equipment Recommendations Rolling walker (2 wheels) (pt reports having RW at home already)  Recommendations for Other Services       Functional Status Assessment Patient has had a recent decline in their functional status and demonstrates the ability to  make significant improvements in function in a reasonable and predictable amount of time.     Precautions / Restrictions Precautions Precautions: Fall Restrictions Weight Bearing Restrictions Per Provider Order: No      Mobility  Bed Mobility Overal bed mobility: Needs Assistance Bed Mobility: Supine to Sit, Sit to Supine     Supine to sit: Mod assist, HOB elevated, Used rails (assist for trunk) Sit to supine: Min assist, HOB elevated, Used rails (assist for B LE's)   General bed mobility comments: vc's for technique    Transfers Overall transfer level: Needs assistance Equipment used: Rolling walker (2 wheels) Transfers: Sit to/from Stand, Bed to chair/wheelchair/BSC Sit to Stand: Min assist   Step pivot transfers: Contact guard assist (stand step turn bed to/from [facility] with RW use)       General transfer comment: min assist to stand from bed x3 trials and from [facility] x2 trials; vc's for UE placement    Ambulation/Gait               General Gait Details: Deferred d/t pt fatigue and multiple toileting needs during session  Stairs            Wheelchair Mobility     Tilt Bed    Modified Rankin (Stroke Patients Only)       Balance Overall balance assessment: Needs assistance Sitting-balance support: No upper extremity supported, Feet supported Sitting balance-Leahy Scale: Good Sitting balance - Comments: steady reaching within BOS   Standing balance support: Bilateral upper extremity supported, Reliant on assistive device for balance Standing balance-Leahy Scale: Fair Standing balance comment: steady static standing  Pertinent Vitals/Pain Pain Assessment Pain Assessment: No/denies pain HR stable (in 60's bpm) and SpO2 sats 92% or greater on room air during session.    Home Living Family/patient expects to be discharged to:: Private residence Living Arrangements: Alone Available Help at Discharge:  Neighbor;Available PRN/intermittently Type of Home: House Home Access: Stairs to enter Entrance Stairs-Rails: Right Entrance Stairs-Number of Steps: 6   Home Layout: One level Home Equipment: Agricultural consultant (2 wheels);Shower seat;Cane - single point;Grab bars - tub/shower      Prior Function Prior Level of Function : Independent/Modified Independent;Driving             Mobility Comments: Uses RW in home; uses SPC in community ADLs Comments: (+) driving; independent with ADL's and IADL's; assist for house cleaning every 2 weeks     Extremity/Trunk Assessment   Upper Extremity Assessment Upper Extremity Assessment: Generalized weakness    Lower Extremity Assessment Lower Extremity Assessment: Generalized weakness       Communication   Communication Communication: No apparent difficulties    Cognition Arousal: Alert Behavior During Therapy: WFL for tasks assessed/performed                           PT - Cognition Comments: Oriented to person, place, and some of situation Following commands: Impaired Following commands impaired: Follows one step commands with increased time, Only follows one step commands consistently     Cueing Cueing Techniques: Verbal cues     General Comments      Exercises     Assessment/Plan    PT Assessment Patient needs continued PT services  PT Problem List Decreased strength;Decreased activity tolerance;Decreased balance;Decreased mobility;Decreased cognition;Decreased knowledge of use of DME;Decreased safety awareness;Decreased knowledge of precautions       PT Treatment Interventions DME instruction;Gait training;Stair training;Functional mobility training;Therapeutic activities;Therapeutic exercise;Balance training;Patient/family education    PT Goals (Current goals can be found in the Care Plan section)  Acute Rehab PT Goals Patient Stated Goal: to go home PT Goal Formulation: With patient Time For Goal  Achievement: 12/16/23 Potential to Achieve Goals: Fair    Frequency Min 3X/week     Co-evaluation               AM-PAC PT "6 Clicks" Mobility  Outcome Measure Help needed turning from your back to your side while in a flat bed without using bedrails?: None Help needed moving from lying on your back to sitting on the side of a flat bed without using bedrails?: A Lot Help needed moving to and from a bed to a chair (including a wheelchair)?: A Little Help needed standing up from a chair using your arms (e.g., wheelchair or bedside chair)?: A Little Help needed to walk in hospital room?: A Lot Help needed climbing 3-5 steps with a railing? : Total 6 Click Score: 15    End of Session Equipment Utilized During Treatment: Gait belt Activity Tolerance: Patient limited by fatigue Patient left: in bed;with call bell/phone within reach;with bed alarm set;with nursing/sitter in room;with family/visitor present;Other (comment) (NT present getting pt's vitals and reported she would finish setting up pt when done) Nurse Communication: Mobility status;Precautions PT Visit Diagnosis: Other abnormalities of gait and mobility (R26.89);Muscle weakness (generalized) (M62.81);History of falling (Z91.81)    Time: 6578-4696 PT Time Calculation (min) (ACUTE ONLY): 32 min   Charges:   PT Evaluation $PT Eval Low Complexity: 1 Low PT Treatments $Therapeutic Activity: 8-22 mins PT General Charges $$ ACUTE  PT VISIT: 1 Visit        Amador Junes, PT 12/02/23, 5:31 PM

## 2023-12-02 NOTE — Plan of Care (Signed)
  Problem: Clinical Measurements: Goal: Diagnostic test results will improve Outcome: Progressing Goal: Cardiovascular complication will be avoided Outcome: Progressing   

## 2023-12-03 DIAGNOSIS — D649 Anemia, unspecified: Secondary | ICD-10-CM | POA: Diagnosis not present

## 2023-12-03 DIAGNOSIS — F03918 Unspecified dementia, unspecified severity, with other behavioral disturbance: Secondary | ICD-10-CM | POA: Diagnosis not present

## 2023-12-03 DIAGNOSIS — G9341 Metabolic encephalopathy: Secondary | ICD-10-CM | POA: Diagnosis not present

## 2023-12-03 DIAGNOSIS — N179 Acute kidney failure, unspecified: Secondary | ICD-10-CM | POA: Diagnosis not present

## 2023-12-03 LAB — BASIC METABOLIC PANEL WITH GFR
Anion gap: 12 (ref 5–15)
BUN: 30 mg/dL — ABNORMAL HIGH (ref 8–23)
CO2: 22 mmol/L (ref 22–32)
Calcium: 8.7 mg/dL — ABNORMAL LOW (ref 8.9–10.3)
Chloride: 109 mmol/L (ref 98–111)
Creatinine, Ser: 1.53 mg/dL — ABNORMAL HIGH (ref 0.44–1.00)
GFR, Estimated: 34 mL/min — ABNORMAL LOW (ref >60)
Glucose, Bld: 90 mg/dL (ref 70–99)
Potassium: 3.2 mmol/L — ABNORMAL LOW (ref 3.5–5.1)
Sodium: 143 mmol/L (ref 135–145)

## 2023-12-03 LAB — CBC
HCT: 28.3 % — ABNORMAL LOW (ref 36.0–46.0)
Hemoglobin: 9 g/dL — ABNORMAL LOW (ref 12.0–15.0)
MCH: 24.7 pg — ABNORMAL LOW (ref 26.0–34.0)
MCHC: 31.8 g/dL (ref 30.0–36.0)
MCV: 77.7 fL — ABNORMAL LOW (ref 80.0–100.0)
Platelets: 208 10*3/uL (ref 150–400)
RBC: 3.64 MIL/uL — ABNORMAL LOW (ref 3.87–5.11)
RDW: 20.2 % — ABNORMAL HIGH (ref 11.5–15.5)
WBC: 5.2 10*3/uL (ref 4.0–10.5)
nRBC: 0 % (ref 0.0–0.2)

## 2023-12-03 LAB — GLUCOSE, CAPILLARY: Glucose-Capillary: 92 mg/dL (ref 70–99)

## 2023-12-03 LAB — MAGNESIUM: Magnesium: 1.8 mg/dL (ref 1.7–2.4)

## 2023-12-03 MED ORDER — LISINOPRIL 20 MG PO TABS
40.0000 mg | ORAL_TABLET | Freq: Every day | ORAL | Status: DC
  Administered 2023-12-03 – 2023-12-14 (×11): 40 mg via ORAL
  Filled 2023-12-03 (×12): qty 2

## 2023-12-03 MED ORDER — POTASSIUM CHLORIDE CRYS ER 20 MEQ PO TBCR
40.0000 meq | EXTENDED_RELEASE_TABLET | Freq: Two times a day (BID) | ORAL | Status: AC
  Administered 2023-12-03 (×2): 40 meq via ORAL
  Filled 2023-12-03 (×2): qty 2

## 2023-12-03 NOTE — Progress Notes (Signed)
 PT Cancellation Note  Patient Details Name: Virginia Walters MRN: [id number] DOB: 1940-08-25   Cancelled Treatment:    Reason Eval/Treat Not Completed: Fatigue/lethargy limiting ability to participate  Offered and encouraged session.  Pt generally lethargic but does answer questions and stated she is too tired and wants no nap.  Declined session at this time.     Charlanne Cong 12/03/2023, 3:31 PM

## 2023-12-03 NOTE — Progress Notes (Signed)
 Inpatient Follow-up/Progress Note   Patient ID: Virginia Walters is a 83 y.o. female.  Overnight Events / Subjective Findings Pt with urinary incontinence o/n. Still with altered mentation, but improving. Can now tell me her age, location, DOB, but not year or circumstances leading to her hospitalization. Tolerating PO. D/w nursing- no diarrhea/melena/hematochezia or other signs of gib. No current gi complaints.  Review of Systems  Unable to perform ROS: Mental status change  Constitutional:  Negative for appetite change.  HENT:  Negative for trouble swallowing.   Respiratory:  Negative for shortness of breath.   Cardiovascular:  Negative for chest pain.  Gastrointestinal:  Negative for abdominal pain, anal bleeding, blood in stool, constipation, diarrhea, nausea and vomiting.  Psychiatric/Behavioral:  Positive for confusion.      Medications  Current Facility-Administered Medications:    acetaminophen  (TYLENOL ) tablet 650 mg, 650 mg, Oral, Q6H PRN **OR** acetaminophen  (TYLENOL ) suppository 650 mg, 650 mg, Rectal, Q6H PRN, Lanetta Pion, MD   cefTRIAXone  (ROCEPHIN ) 1 g in sodium chloride  0.9 % 100 mL IVPB, 1 g, Intravenous, Q24H, Griffith, Kelly A, DO, Last Rate: 200 mL/hr at 12/03/23 1108, 1 g at 12/03/23 1108   doxazosin  (CARDURA ) tablet 2 mg, 2 mg, Oral, Daily, Ree Candy, MD, 2 mg at 12/03/23 0945   ipratropium-albuterol  (DUONEB) 0.5-2.5 (3) MG/3ML nebulizer solution 3 mL, 3 mL, Nebulization, Q6H PRN, Darus Engels A, DO, 3 mL at 12/02/23 1430   lisinopril  (ZESTRIL ) tablet 40 mg, 40 mg, Oral, Daily, Darus Engels A, DO, 40 mg at 12/03/23 0945   ondansetron  (ZOFRAN ) tablet 4 mg, 4 mg, Oral, Q6H PRN **OR** ondansetron  (ZOFRAN ) injection 4 mg, 4 mg, Intravenous, Q6H PRN, Lanetta Pion, MD   oxyCODONE  (Oxy IR/ROXICODONE ) immediate release tablet 5 mg, 5 mg, Oral, Q4H PRN, Lanetta Pion, MD   pantoprazole  (PROTONIX ) EC tablet 40 mg, 40 mg, Oral, Daily, Brion Cancel  V, MD, 40 mg at 12/03/23 0945   sodium chloride  flush (NS) 0.9 % injection 3 mL, 3 mL, Intravenous, Q12H, Brion Cancel V, MD, 3 mL at 12/02/23 2300  cefTRIAXone  (ROCEPHIN )  IV 1 g (12/03/23 1108)    acetaminophen  **OR** acetaminophen , ipratropium-albuterol , ondansetron  **OR** ondansetron  (ZOFRAN ) IV, oxyCODONE    Objective    Vitals:   12/03/23 0400 12/03/23 0500 12/03/23 0550 12/03/23 0741  BP:  (!) 156/57  (!) 162/56  Pulse:  76  75  Resp: (!) 24 20  18   Temp:    98.5 F (36.9 C)  TempSrc:    Oral  SpO2:  99%  97%  Weight:   99.3 kg   Height:        Physical Exam Vitals and nursing note reviewed.  Constitutional:      General: She is not in acute distress.    Appearance: She is obese. She is not ill-appearing, toxic-appearing or diaphoretic.  HENT:     Head: Normocephalic and atraumatic.     Nose: Nose normal.     Mouth/Throat:     Mouth: Mucous membranes are moist.     Pharynx: Oropharynx is clear.  Eyes:     General: No scleral icterus.    Extraocular Movements: Extraocular movements intact.  Cardiovascular:     Rate and Rhythm: Normal rate and regular rhythm.  Pulmonary:     Effort: Pulmonary effort is normal. No respiratory distress.     Breath sounds: Normal breath sounds. No wheezing, rhonchi or rales.  Abdominal:     General: Bowel sounds  are normal. There is no distension.     Palpations: Abdomen is soft.     Tenderness: There is no abdominal tenderness. There is no guarding or rebound.  Musculoskeletal:     Cervical back: Neck supple.     Right lower leg: No edema.     Left lower leg: No edema.  Skin:    General: Skin is warm and dry.     Coloration: Skin is not jaundiced or pale.  Neurological:     General: No focal deficit present.     Mental Status: She is alert and oriented to person, place, and time. Mental status is at baseline.     Comments: Altered. AxO to person and hospital- no other place/time/circumstances   Psychiatric:        Mood and  Affect: Mood normal.        Behavior: Behavior normal.        Thought Content: Thought content normal.        Judgment: Judgment normal.     Comments: calm      Laboratory Data Recent Labs  Lab 12/01/23 1746 12/03/23 0425  WBC 6.0 5.2  HGB 10.5* 9.0*  HCT 34.6* 28.3*  PLT 236 208  NEUTOPHILPCT 72  --   LYMPHOPCT 16  --   MONOPCT 8  --   EOSPCT 3  --    Recent Labs  Lab 12/01/23 1746 12/02/23 0835 12/03/23 0425  NA 143 143 143  K 2.8* 3.0* 3.2*  CL 106 110 109  CO2 22 24 22   BUN 27* 29* 30*  CREATININE 3.28* 2.41* 1.53*  CALCIUM  9.5 8.6* 8.7*  PROT 7.1  --   --   BILITOT 1.4*  --   --   ALKPHOS 49  --   --   ALT 21  --   --   AST 26  --   --   GLUCOSE 109* 86 90   No results for input(s): "INR" in the last 168 hours.    Imaging Studies: CT Head Wo Contrast Result Date: 12/01/2023 CLINICAL DATA:  Mental status change, unknown cause. Possible syncopal episode. EXAM: CT HEAD WITHOUT CONTRAST TECHNIQUE: Contiguous axial images were obtained from the base of the skull through the vertex without intravenous contrast. RADIATION DOSE REDUCTION: This exam was performed according to the departmental dose-optimization program which includes automated exposure control, adjustment of the mA and/or kV according to patient size and/or use of iterative reconstruction technique. COMPARISON:  Head CT 07/22/2021. FINDINGS: Brain: No acute hemorrhage. Unchanged background of moderate chronic small-vessel disease. Gray-white differentiation is preserved. Unchanged 4 mm hyperdense lesion along the roof of the third ventricle (coronal image 31 series 4), consistent with a colloid cyst. No hydrocephalus or extra-axial collection. No mass effect or midline shift. Vascular: No hyperdense vessel or unexpected calcification. Skull: No calvarial fracture or suspicious bone lesion. Skull base is unremarkable. Sinuses/Orbits: No acute finding. Other: None. IMPRESSION: 1. No acute intracranial  abnormality. 2. 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 No hydrocephalus. 3. Unchanged background of moderate chronic small-vessel disease. Electronically Signed   By: Audra Blend M.D.   On: 12/01/2023 18:31    Assessment:   # Acute encephalopathy  - uti - multiple mentation affecting medications with additional AKI - unclear if she was drinking prep prior to episode. Given that she doesn't remember drinking any, suspect encephalopathy was prior   # AKI   # hypokalemia   #UTI   #  Anemia - being w/u; initially planned for bidirectional endoscopy on Monday - hgb improved from last discharge - no signs of gib  # HTN   # colonic avm   # colon diverticulosis   # COPD, HFpEF, HTN  Plan:  Patient is still confused, but improving mentation Will defer to Dr. Ole Berkeley (who takes over service tmrw and was performing the patient's bidirectional endoscopy scheduled initially for tomorrow to discuss when to perform procedures. No plan for endoscopy tmrw at this time. It would be difficult for her to prep for colonoscopy overnight given urinary incontinence and difficulty ambulating and current aki Hgb stable- initial hospital decrease likely from  IVF resuscitation in setting of AKI as no signs of gib  Abx for uti as per primary team Avoid sedating medications that can affect mentation Tolerating full liquid diet Monitor H&H.  Transfusion and resuscitation as per primary team Avoid frequent lab draws to prevent lab induced anemia Supportive care and antiemetics as per primary team Maintain two sites IV access Avoid nsaids Monitor for GIB. Electrolyte correction as per primary team. K+ will need to be >3.0 for anesthesia purposes  Management of other medical comorbidities as per primary team  I personally performed the service.  Thank you for allowing us  to participate in this patient's care. Please don't hesitate to call if any questions or concerns  arise.   Quintin Buckle, DO [facility]  Portions of the record may have been created with voice recognition software. Occasional wrong-word or 'sound-a-like' substitutions may have occurred due to the inherent limitations of voice recognition software.  Read the chart carefully and recognize, using context, where substitutions may have occurred.

## 2023-12-03 NOTE — Progress Notes (Addendum)
 Progress Note   [name] EAV:[id number] DOB: [birth year] DOA: 12/01/2023     1 DOS: the patient was seen and examined on 12/03/2023   Brief hospital course: HPI on admission by Dr. Vallarie Gauze:  " Virginia Walters is a 83 y.o. female with medical history significant for HTN, HLD, COPD, HFpEF( EF 55-60% 11/16/2023), alcohol use disorder, chronic back pain, neuropathy, recently admitted from 4/1 to 4/4 with symptomatic anemia requiring 3 units PRBCs, complicated by acute encephalopathy attributed to delirium versus alcohol abuse versus baseline dementia, referred for outpatient GI workup and IV iron  with hematology, with good being admitted with a syncopal episode that occurred during the course of a colonoscopy prep. Tomorrow.  Patient was found sitting outside confused.  Per triage note, there was evidence of a trail of fecal incontinence from where she was sitting to inside the house and within the house.  Patient is currently oriented to person and place and is not sure about the circumstances but states she must have passed out. ED course and data review:Vitals within normal limits. Labs notable for AKI with creatinine 3.28 up from 1.03 a couple weeks prior. Hemoglobin 10.5 up from 9.3 a couple weeks aago. K+ 2.8 WBC and lactic acid normal Etoh <10 EKG with NSR at 67 CT head non acute "  Patient was admitted for further evaluation and management of acute metabolic encephalopathy and question of possible syncopal episode and AKI.  GI was consulted on admission, they do not plan for endoscopy before Monday, if acute issues are improved.   Further hospital course and management as outlined below.  4/19 -- Not UA appears grossly positive for UTI.  Started empiric IV Rocephin  and added on a urine culture.  Pt more alert but remains very confused, oriented only to self.  4/20 -- mental status improved somewhat, baseline unclear.  Cr improving.  Urine culture +E coli, remains on empiric  Rocephin .  No plan for endoscopy tomorrow per GI, needs further clinical improvement before bowel prep for procedure.    Assessment and Plan:  UTI - POA.  Not septic. 4/20 -- urine culture growing E.coli --Continue empiric IV Rocephin  --Follow urine culture for susceptibilities --Monitor fever curve, CBC  Possible Syncope - in setting of pt doing bowel prep which appears she started too early, since scope was scheduled for Monday Acute metabolic encephalopathy - due to UTI, ?dehydration, in combination with possible alcohol use --s/p IV fluid resuscitation --Serial H&H --Neurologic checks with fall and aspiration precautions --Patient had recent cardiac clearance in preparation for colonoscopy --Telemetry monitoring, no further cardiac evaluation indicated at this time --Monitor --Delirium precautions   AKI (acute kidney injury)  Baseline Cr 1.0-1.1. Admitted with Cr 3.28 with pre-renal azotemia Likely secondary to dehydration from bowel prep Cr trend: 3.28 >> 2.41 >> 1.53 --s/p IV fluid resuscitation --Encourage PO hydration --Monitor renal function and avoid nephrotoxins   Dementia,  --Delirium precautions   Symptomatic anemia Presumed GI.  Had recent transfusion of 3 units PRBCs and Venofer  --Full liquid diet per GI --GI consulted on admission --Endoscopy planned once AMS is improved, initially planned for Monday delayed due to acute illness as above --Monitor CBC --Transfuse RBC's if Hbg < 7 or < 8 if signs of active bleeding   COPD (chronic obstructive pulmonary disease) (HCC) Not acutely exacerbated --As needed albuterol    Chronic systolic heart failure (HCC) EF 55 to 60% with G1 DD.  History of systolic heart failure --Hold lisinopril  due to AKI  Alcohol use disorder EtOH level undetectable --CIWA withdrawal protocol   HTN (hypertension) --Hold antihypertensives in the setting of dehydration      Subjective: Pt seen awake sitting up in bed this AM.   She reports feeling well, has no acute complaints. She knows she is in the hospital, but not why she is here.  Pt laughs when asked questions she does not recall answers to, such as current year or what events led to her coming to the hospital.     Physical Exam: Vitals:   12/03/23 0400 12/03/23 0500 12/03/23 0550 12/03/23 0741  BP:  (!) 156/57  (!) 162/56  Pulse:  76  75  Resp: (!) 24 20  18   Temp:    98.5 F (36.9 C)  TempSrc:    Oral  SpO2:  99%  97%  Weight:   99.3 kg   Height:       General exam: awake, alert, no acute distress, obese, pleasantly confused HEENT: moist mucus membranes, hearing grossly normal  Respiratory system: CTAB, no wheezes, rales or rhonchi, normal respiratory effort. Cardiovascular system: normal S1/S2, RRR, no JVD, murmurs, rubs, gallops,  no pedal edema.   Gastrointestinal system: soft, NT, ND, no HSM felt, +bowel sounds. Central nervous system: A&O x self and hospital, not year or situation. no gross focal neurologic deficits, normal speech Extremities: moves all, no edema, normal tone Skin: dry, intact, normal temperature Psychiatry: normal mood, congruent affect, confused, abnormal judgment and insight   Data Reviewed:  Notable lab s--  K 3.2 BUN 30 Cr 3.28 >> 2.41 >> 1.53 Ca 8.7 Hbg 9.0  Family Communication: None present. Attempted to call contact [name] (cousin) and [name] (daughter), unsuccessful. Will try again as time allows.    Disposition: Status is: Observation Remains admitted on IV antibiotics for Uti pending cultures, GI evaluation planned Monday   Planned Discharge Destination: Home    Time spent: 42 minutes  Author: Montey Apa, DO 12/03/2023 1:50 PM  For on call review [email or web address].

## 2023-12-03 NOTE — Plan of Care (Signed)
  Problem: Education: Goal: Knowledge of condition and prescribed therapy will improve 12/03/2023 0255 by Deryl Flora, RN Outcome: Progressing 12/03/2023 0054 by Deryl Flora, RN Outcome: Progressing   Problem: Cardiac: Goal: Will achieve and/or maintain adequate cardiac output 12/03/2023 0255 by Deryl Flora, RN Outcome: Progressing 12/03/2023 0054 by Deryl Flora, RN Outcome: Progressing   Problem: Physical Regulation: Goal: Complications related to the disease process, condition or treatment will be avoided or minimized 12/03/2023 0255 by Deryl Flora, RN Outcome: Progressing 12/03/2023 0054 by Deryl Flora, RN Outcome: Progressing   Problem: Skin Integrity: Goal: Risk for impaired skin integrity will decrease 12/03/2023 0255 by Deryl Flora, RN Outcome: Progressing 12/03/2023 0054 by Deryl Flora, RN Outcome: Progressing   Problem: Pain Managment: Goal: General experience of comfort will improve and/or be controlled 12/03/2023 0255 by Deryl Flora, RN Outcome: Progressing 12/03/2023 0054 by Deryl Flora, RN Outcome: Progressing   Problem: Elimination: Goal: Will not experience complications related to urinary retention 12/03/2023 0255 by Deryl Flora, RN Outcome: Progressing 12/03/2023 0054 by Deryl Flora, RN Outcome: Progressing   Problem: Elimination: Goal: Will not experience complications related to bowel motility 12/03/2023 0255 by Deryl Flora, RN Outcome: Progressing 12/03/2023 0054 by Deryl Flora, RN Outcome: Progressing   Problem: Coping: Goal: Level of anxiety will decrease 12/03/2023 0255 by Deryl Flora, RN Outcome: Progressing 12/03/2023 0054 by Deryl Flora, RN Outcome: Progressing   Problem: Nutrition: Goal: Adequate nutrition will be maintained 12/03/2023 0255 by Deryl Flora, RN Outcome: Progressing 12/03/2023 0054 by Deryl Flora, RN Outcome: Progressing    Plan of care  ongoing, see MAR see flowsheet

## 2023-12-04 ENCOUNTER — Ambulatory Visit: Admission: RE | Admit: 2023-12-04 | Source: Home / Self Care | Admitting: Gastroenterology

## 2023-12-04 ENCOUNTER — Encounter
Admission: EM | Disposition: A | Discharge status: Skilled Nursing Facility | Payer: Self-pay | Source: Home / Self Care | Attending: Internal Medicine

## 2023-12-04 DIAGNOSIS — D649 Anemia, unspecified: Secondary | ICD-10-CM | POA: Diagnosis not present

## 2023-12-04 DIAGNOSIS — F03918 Unspecified dementia, unspecified severity, with other behavioral disturbance: Secondary | ICD-10-CM | POA: Diagnosis not present

## 2023-12-04 DIAGNOSIS — G9341 Metabolic encephalopathy: Secondary | ICD-10-CM | POA: Diagnosis not present

## 2023-12-04 LAB — CBC
HCT: 29.1 % — ABNORMAL LOW (ref 36.0–46.0)
Hemoglobin: 9.2 g/dL — ABNORMAL LOW (ref 12.0–15.0)
MCH: 24.8 pg — ABNORMAL LOW (ref 26.0–34.0)
MCHC: 31.6 g/dL (ref 30.0–36.0)
MCV: 78.4 fL — ABNORMAL LOW (ref 80.0–100.0)
Platelets: 231 10*3/uL (ref 150–400)
RBC: 3.71 MIL/uL — ABNORMAL LOW (ref 3.87–5.11)
RDW: 20.4 % — ABNORMAL HIGH (ref 11.5–15.5)
WBC: 5 10*3/uL (ref 4.0–10.5)
nRBC: 0 % (ref 0.0–0.2)

## 2023-12-04 LAB — URINE CULTURE: Culture: >=100000 — AB

## 2023-12-04 LAB — BASIC METABOLIC PANEL WITH GFR
Anion gap: 6 (ref 5–15)
BUN: 29 mg/dL — ABNORMAL HIGH (ref 8–23)
CO2: 23 mmol/L (ref 22–32)
Calcium: 8.9 mg/dL (ref 8.9–10.3)
Chloride: 114 mmol/L — ABNORMAL HIGH (ref 98–111)
Creatinine, Ser: 1.33 mg/dL — ABNORMAL HIGH (ref 0.44–1.00)
GFR, Estimated: 40 mL/min — ABNORMAL LOW (ref >60)
Glucose, Bld: 101 mg/dL — ABNORMAL HIGH (ref 70–99)
Potassium: 3.4 mmol/L — ABNORMAL LOW (ref 3.5–5.1)
Sodium: 143 mmol/L (ref 135–145)

## 2023-12-04 LAB — TSH: TSH: 1.812 u[IU]/mL (ref 0.350–4.500)

## 2023-12-04 LAB — AMMONIA: Ammonia: 15 umol/L (ref 9–35)

## 2023-12-04 SURGERY — COLONOSCOPY
Anesthesia: General

## 2023-12-04 MED ORDER — POTASSIUM CHLORIDE CRYS ER 20 MEQ PO TBCR
40.0000 meq | EXTENDED_RELEASE_TABLET | Freq: Once | ORAL | Status: AC
  Administered 2023-12-04: 40 meq via ORAL
  Filled 2023-12-04: qty 2

## 2023-12-04 MED ORDER — CEPHALEXIN 500 MG PO CAPS
500.0000 mg | ORAL_CAPSULE | Freq: Three times a day (TID) | ORAL | Status: DC
  Administered 2023-12-05 – 2023-12-06 (×4): 500 mg via ORAL
  Filled 2023-12-04 (×4): qty 1

## 2023-12-04 MED ORDER — AMLODIPINE BESYLATE 5 MG PO TABS
5.0000 mg | ORAL_TABLET | Freq: Every day | ORAL | Status: DC
  Administered 2023-12-04 – 2023-12-14 (×10): 5 mg via ORAL
  Filled 2023-12-04 (×11): qty 1

## 2023-12-04 NOTE — Plan of Care (Signed)

## 2023-12-04 NOTE — Progress Notes (Signed)
 Progress Note   [name]:[id number] DOB: [birth year] DOA: 12/01/2023     2 DOS: the patient was seen and examined on 12/04/2023   Brief hospital course: HPI on admission by Dr. Vallarie Gauze:  " Virginia Walters is a 83 y.o. female with medical history significant for HTN, HLD, COPD, HFpEF( EF 55-60% 11/16/2023), alcohol use disorder, chronic back pain, neuropathy, recently admitted from 4/1 to 4/4 with symptomatic anemia requiring 3 units PRBCs, complicated by acute encephalopathy attributed to delirium versus alcohol abuse versus baseline dementia, referred for outpatient GI workup and IV iron  with hematology, with good being admitted with a syncopal episode that occurred during the course of a colonoscopy prep. Tomorrow.  Patient was found sitting outside confused.  Per triage note, there was evidence of a trail of fecal incontinence from where she was sitting to inside the house and within the house.  Patient is currently oriented to person and place and is not sure about the circumstances but states she must have passed out. ED course and data review:Vitals within normal limits. Labs notable for AKI with creatinine 3.28 up from 1.03 a couple weeks prior. Hemoglobin 10.5 up from 9.3 a couple weeks aago. K+ 2.8 WBC and lactic acid normal Etoh <10 EKG with NSR at 67 CT head non acute "  Patient was admitted for further evaluation and management of acute metabolic encephalopathy and question of possible syncopal episode and AKI.  GI was consulted on admission, they do not plan for endoscopy before Monday, if acute issues are improved.   Further hospital course and management as outlined below.  4/19 -- Not UA appears grossly positive for UTI.  Started empiric IV Rocephin  and added on a urine culture.  Pt more alert but remains very confused, oriented only to self.  4/20 -- mental status improved somewhat, baseline unclear.  Cr improving.  Urine culture +E coli, remains on empiric  Rocephin .  No plan for endoscopy tomorrow per GI, needs further clinical improvement before bowel prep for procedure.    Assessment and Plan:  UTI - POA.  Not septic. 4/20 -- urine culture growing E.coli --Treated with empiric IV Rocephin  x 3 days --Urine culture grew pan-sensitive E. Coli --Transition to Keflex  x 2 more days  --Monitor fever curve, CBC  Possible Syncope - in setting of pt doing bowel prep which appears she started too early, since scope was scheduled for Monday Acute metabolic encephalopathy - due to UTI, ?dehydration, in combination with possible alcohol use --s/p IV fluid resuscitation --Serial H&H --Neurologic checks with fall and aspiration precautions --Patient had recent cardiac clearance in preparation for colonoscopy --Telemetry monitoring, no further cardiac evaluation indicated at this time --Monitor --Delirium precautions   AKI (acute kidney injury)  Baseline Cr 1.0-1.1. Admitted with Cr 3.28 with pre-renal azotemia Likely secondary to dehydration from bowel prep Cr trend: 3.28 >> 2.41 >> 1.53 >> 1.33 --s/p IV fluid resuscitation --Encourage PO hydration --Monitor renal function and avoid nephrotoxins   Dementia,  --Delirium precautions   Symptomatic anemia Presumed GI.  Had recent transfusion of 3 units PRBCs and Venofer  --Full liquid diet per GI --GI consulted on admission --Endoscopy planned once AMS is improved, initially planned for Monday delayed due to acute illness as above --Monitor CBC --Transfuse RBC's if Hbg < 7 or < 8 if signs of active bleeding   COPD (chronic obstructive pulmonary disease) (HCC) Not acutely exacerbated --As needed albuterol    Chronic systolic heart failure (HCC) EF 55 to  60% with G1 DD.  History of systolic heart failure --Lisinopril  was held due to AKI on admission - resumed   Alcohol use disorder EtOH level undetectable --Cancelled CIWA protocol   HTN (hypertension) BP's elevated. Lisinopril  has been  on hold since admission due to AKI. Renal function has improved. --Resumed home lisinopril  40 mg daily      Subjective: Pt seen awake sitting up in bed this AM.  She reports feeling well, has no complaints.  No acute events reported.       Physical Exam: Vitals:   12/03/23 2347 12/04/23 0402 12/04/23 0534 12/04/23 1151  BP: (!) 156/79 (!) 166/76  (!) 168/62  Pulse: 75 71  76  Resp: (!) 24 (!) 24    Temp: 99 F (37.2 C) 97.9 F (36.6 C)  98.4 F (36.9 C)  TempSrc: Oral Oral  Oral  SpO2: 97% 96%  96%  Weight:   98.9 kg   Height:       General exam: awake, alert, no acute distress, obese, pleasantly confused HEENT: moist mucus membranes, hearing grossly normal  Respiratory system: on room air, normal respiratory effort. Cardiovascular system: RRR,no peripheral edema Gastrointestinal system: soft, NT, ND Central nervous system: A&O x self and hospital, not year or situation. no gross focal neurologic deficits, normal speech Extremities: moves all, no edema, normal tone Psychiatry: normal mood, congruent affect, confused, abnormal judgment and insight   Data Reviewed:  Notable lab s--  K 3.2 >> 3.4 BUN 29 Cr 3.28 >> 2.41 >> 1.53 >> 2.33  Hbg 9.0 >> 9.2 stable   Family Communication: None present.  4/20 - Attempted to call contact [name] (cousin) and [name] (daughter), unsuccessful. Will try again as time allows.    Disposition: Status is: Inpatient Remains inpatient appropriate because:   pending GI evaluation    Planned Discharge Destination: Home    Time spent: 42 minutes  Author: Montey Apa, DO 12/04/2023 12:23 PM  For on call review [email or web address].

## 2023-12-04 NOTE — Plan of Care (Signed)
  Problem: Education: Goal: Knowledge of condition and prescribed therapy will improve Outcome: Progressing   Problem: Physical Regulation: Goal: Complications related to the disease process, condition or treatment will be avoided or minimized Outcome: Progressing   Problem: Education: Goal: Knowledge of General Education information will improve Description: Including pain rating scale, medication(s)/side effects and non-pharmacologic comfort measures Outcome: Progressing   Problem: Activity: Goal: Risk for activity intolerance will decrease Outcome: Progressing   Problem: Nutrition: Goal: Adequate nutrition will be maintained Outcome: Progressing   Problem: Coping: Goal: Level of anxiety will decrease Outcome: Progressing   Problem: Elimination: Goal: Will not experience complications related to bowel motility Outcome: Progressing   Problem: Safety: Goal: Ability to remain free from injury will improve Outcome: Progressing   Problem: Skin Integrity: Goal: Risk for impaired skin integrity will decrease Outcome: Progressing    Plan of care ongoing, see MAR see flowsheet

## 2023-12-04 NOTE — Progress Notes (Signed)
 The patient was seen this morning and was lethargic. She has anemia and has been set up for an EGD and colonoscopy twice with one time not getting the prep and the procedure was cancelled and then a week later came in without taking part of the prep and not being cleaned out. She is now admitted with delirium. The patient should have her work up for the anemia as an inpatient when she is more mentally stable to take the prep.

## 2023-12-05 DIAGNOSIS — F1010 Alcohol abuse, uncomplicated: Secondary | ICD-10-CM

## 2023-12-05 DIAGNOSIS — D649 Anemia, unspecified: Secondary | ICD-10-CM | POA: Diagnosis not present

## 2023-12-05 DIAGNOSIS — I5022 Chronic systolic (congestive) heart failure: Secondary | ICD-10-CM | POA: Diagnosis not present

## 2023-12-05 DIAGNOSIS — F03918 Unspecified dementia, unspecified severity, with other behavioral disturbance: Secondary | ICD-10-CM | POA: Diagnosis not present

## 2023-12-05 LAB — BASIC METABOLIC PANEL WITH GFR
Anion gap: 7 (ref 5–15)
BUN: 22 mg/dL (ref 8–23)
CO2: 22 mmol/L (ref 22–32)
Calcium: 8.9 mg/dL (ref 8.9–10.3)
Chloride: 112 mmol/L — ABNORMAL HIGH (ref 98–111)
Creatinine, Ser: 1.24 mg/dL — ABNORMAL HIGH (ref 0.44–1.00)
GFR, Estimated: 43 mL/min — ABNORMAL LOW (ref >60)
Glucose, Bld: 89 mg/dL (ref 70–99)
Potassium: 3.5 mmol/L (ref 3.5–5.1)
Sodium: 141 mmol/L (ref 135–145)

## 2023-12-05 MED ORDER — THIAMINE HCL 100 MG/ML IJ SOLN
500.0000 mg | Freq: Three times a day (TID) | INTRAVENOUS | Status: DC
  Administered 2023-12-05 – 2023-12-07 (×4): 500 mg via INTRAVENOUS
  Filled 2023-12-05 (×4): qty 5
  Filled 2023-12-05: qty 2
  Filled 2023-12-05 (×3): qty 5

## 2023-12-05 MED ORDER — PEG 3350-KCL-NA BICARB-NACL 420 G PO SOLR
4000.0000 mL | Freq: Once | ORAL | Status: AC
  Administered 2023-12-05: 4000 mL via ORAL
  Filled 2023-12-05: qty 4000

## 2023-12-05 MED ORDER — HYDRALAZINE HCL 20 MG/ML IJ SOLN
10.0000 mg | INTRAMUSCULAR | Status: DC | PRN
  Administered 2023-12-05 – 2023-12-11 (×2): 10 mg via INTRAVENOUS
  Filled 2023-12-05 (×3): qty 1

## 2023-12-05 NOTE — Care Management Important Message (Signed)
 Important Message  Patient Details  Name: Virginia Walters MRN: [id number] Date of Birth: [birth year]   Important Message Given:  Yes - Medicare IM     Anise Kerns 12/05/2023, 10:16 AM

## 2023-12-05 NOTE — Progress Notes (Signed)
 PT Cancellation Note  Patient Details Name: Virginia Walters MRN: [id number] DOB: 05/28/1941   Cancelled Treatment:    Reason Eval/Treat Not Completed: Other (comment) (Chart reviewed, treatment attempted. Pt needs help with cleanup at present, in process of bowel prep for GI studies. Will attempt treatment again, later date/time.)  12:08 PM, 12/05/23 Dawn Eth, PT, DPT Physical Therapist - [facility]  [phone] (ASCOM)    Phala Schraeder C 12/05/2023, 12:08 PM

## 2023-12-05 NOTE — Plan of Care (Signed)

## 2023-12-05 NOTE — Progress Notes (Signed)
 Virginia Sink, MD [facility]   [address] [place], [place] [address] Phone: [phone] Fax : [phone]   Subjective: The patient was seen this morning and is more alert and awake today than she was yesterday.  The patient had been set up for a colonoscopy on 2 previous occasions without completing the prep.  She now is willing to undergo the upper endoscopy and colonoscopy since she is already in the hospital.   Objective: Vital signs in last 24 hours: Vitals:   12/04/23 2316 12/05/23 0349 12/05/23 0500 12/05/23 0908  BP: (!) 161/81 (!) 167/78  (!) 159/50  Pulse: 77 78  81  Resp: (!) 24 20    Temp: 98.4 F (36.9 C) 97.9 F (36.6 C)  97.6 F (36.4 C)  TempSrc: Oral     SpO2: 90% 97%  95%  Weight:   99.1 kg   Height:       Weight change: 0.2 kg  Intake/Output Summary (Last 24 hours) at 12/05/2023 1226 Last data filed at 12/05/2023 0900 Gross per 24 hour  Intake 480 ml  Output --  Net 480 ml     Exam: Heart:: Regular rate and rhythm or without murmur or extra heart sounds Lungs: normal and clear to auscultation and percussion Abdomen: soft, nontender, normal bowel sounds   Lab Results: @LABTEST2 @ Micro Results: Recent Results (from the past 240 hours)  Urine Culture (for pregnant, neutropenic or urologic patients or patients with an indwelling urinary catheter)     Status: Abnormal   Collection Time: 12/01/23  9:44 PM   Specimen: Urine, Clean Catch  Result Value Ref Range Status   Specimen Description   Final    URINE, CLEAN CATCH Performed at [facility], [address] [place], [place] [address]    Special Requests   Final    NONE Performed at [facility], [address] [place], [place] [address]    Culture >=100,000 COLONIES/mL ESCHERICHIA COLI (A)  Final   Report Status 12/04/2023 FINAL  Final   Organism ID, Bacteria ESCHERICHIA COLI (A)  Final      Susceptibility   Escherichia coli - MIC*    AMPICILLIN >=32  RESISTANT Resistant     CEFAZOLIN  <=4 SENSITIVE Sensitive     CEFEPIME <=0.12 SENSITIVE Sensitive     CEFTRIAXONE  <=0.25 SENSITIVE Sensitive     CIPROFLOXACIN <=0.25 SENSITIVE Sensitive     GENTAMICIN <=1 SENSITIVE Sensitive     IMIPENEM <=0.25 SENSITIVE Sensitive     NITROFURANTOIN  <=16 SENSITIVE Sensitive     TRIMETH/SULFA <=20 SENSITIVE Sensitive     AMPICILLIN/SULBACTAM 16 INTERMEDIATE Intermediate     PIP/TAZO <=4 SENSITIVE Sensitive ug/mL    * >=100,000 COLONIES/mL ESCHERICHIA COLI   Studies/Results: No results found. Medications: I have reviewed the patient's current medications. Scheduled Meds:  amLODipine   5 mg Oral Daily   cephALEXin   500 mg Oral Q8H   doxazosin   2 mg Oral Daily   lisinopril   40 mg Oral Daily   pantoprazole   40 mg Oral Daily   sodium chloride  flush  3 mL Intravenous Q12H   Continuous Infusions: PRN Meds:.acetaminophen  **OR** acetaminophen , ipratropium-albuterol , ondansetron  **OR** ondansetron  (ZOFRAN ) IV, oxyCODONE    Assessment: Principal Problem:   Syncope Active Problems:   HTN (hypertension)   Dementia with behavioral problem   Alcohol use disorder   Symptomatic anemia   Chronic systolic heart failure (HCC)   COPD (chronic obstructive pulmonary disease) (HCC)   AKI (acute kidney injury) (  HCC)   Acute metabolic encephalopathy    Plan: This patient has a history of anemia and alcohol abuse disorder.  The patient states that she stopped drinking 5 days ago.  The patient has agreed to undergo an EGD and colonoscopy for her anemia.  The patient will be prepped today with hopefully being able to do the procedure tomorrow.  The patient has been explained the plan and agrees with it.   LOS: 3 days   Virginia Sink, MD.FACG 12/05/2023, 12:26 PM Pager [phone] 7am-5pm  Check AMION for 5pm -7am coverage and on weekends

## 2023-12-05 NOTE — Progress Notes (Signed)
 Occupational Therapy Treatment Patient Details Name: Virginia Walters MRN: [id number] DOB: Dec 15, 1940 Today's Date: 12/05/2023   History of present illness Pt is an 83 y.o. female presenting to hospital 12/01/23 after being found down in her yard by EMS after possible syncopal episode; incontinent of stool and urine; pt was doing colonoscopy prep.  Pt admitted with UTI, possible syncope, acute metabolic encephalopathy, AKI, symptomatic anemia.  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  Recent hospitalization 4/1 to 11/17/23 with symptomatic anemia requiring 3 units PRBC's complicated by acute encephalopathy.   OT comments  Chart reviewed, pt greeted in bed, incontinent of BM, currently undergoing prep for colonoscopy. Pt is oriented to self only, labile at times with poor awareness of current level of functioning. Per chart pt is alert and oriented x4 at baseline. Tech in room for +2. MOD-MAX A required for rolling, MAX A for peri care, MIN A for UB dressing, supervision for grooming tasks. Further out of bed deferred due to fatigue and ongoing bowel prep. Pt is left in bed, all needs met. OT will continue to follow.       If plan is discharge home, recommend the following:  A lot of help with bathing/dressing/bathroom;Assistance with cooking/housework;A little help with walking and/or transfers;Supervision due to cognitive status   Equipment Recommendations  Other (comment) (defer)    Recommendations for Other Services      Precautions / Restrictions Precautions Precautions: Fall Recall of Precautions/Restrictions: Impaired Restrictions Weight Bearing Restrictions Per Provider Order: No       Mobility Bed Mobility Overal bed mobility: Needs Assistance Bed Mobility: Rolling Rolling: Mod assist, Max assist, Used rails              Transfers                    General transfer comment: declines on this date     Balance                                           ADL either performed or assessed with clinical judgement   ADL Overall ADL's : Needs assistance/impaired     Grooming: Supervision/safety;Set up;Bed level;Wash/dry face       Lower Body Bathing: Maximal assistance;Total assistance;Bed level   Upper Body Dressing : Minimal assistance;Bed level   Lower Body Dressing: Maximal assistance       Toileting- Clothing Manipulation and Hygiene: Maximal assistance;Total assistance;Bed level Toileting - Clothing Manipulation Details (indicate cue type and reason): pt is undergoing prep for colonoscopy            Extremity/Trunk Assessment              Vision Patient Visual Report: No change from baseline     Perception     Praxis     Communication Communication Communication: No apparent difficulties   Cognition Arousal: Alert Behavior During Therapy: WFL for tasks assessed/performed, Lability Cognition: Cognition impaired   Orientation impairments: Place, Time, Situation Awareness: Intellectual awareness impaired, Online awareness impaired Memory impairment (select all impairments): Short-term memory, Working memory Attention impairment (select first level of impairment): Sustained attention Executive functioning impairment (select all impairments): Reasoning, Problem solving  Following commands: Impaired Following commands impaired: Follows one step commands with increased time, Only follows one step commands consistently      Cueing   Cueing Techniques: Gestural cues, Verbal cues, Tactile cues, Visual cues  Exercises Other Exercises Other Exercises: edu re: role of OT, role of rehab, discharge recommendations    Shoulder Instructions       General Comments vss throughout    Pertinent Vitals/ Pain       Pain Assessment Pain Assessment: No/denies  pain  Home Living                                          Prior Functioning/Environment              Frequency  Min 2X/week        Progress Toward Goals  OT Goals(current goals can now be found in the care plan section)  Progress towards OT goals: Progressing toward goals  Acute Rehab OT Goals Time For Goal Achievement: 12/16/23  Plan      Co-evaluation                 AM-PAC OT "6 Clicks" Daily Activity     Outcome Measure   Help from another person eating meals?: A Little Help from another person taking care of personal grooming?: A Little Help from another person toileting, which includes using toliet, bedpan, or urinal?: Total Help from another person bathing (including washing, rinsing, drying)?: Total Help from another person to put on and taking off regular upper body clothing?: A Lot Help from another person to put on and taking off regular lower body clothing?: A Lot 6 Click Score: 12    End of Session    OT Visit Diagnosis: Muscle weakness (generalized) (M62.81)   Activity Tolerance Patient tolerated treatment well   Patient Left in bed;with bed alarm set;with call bell/phone within reach   Nurse Communication Mobility status        Time: 1610-9604 OT Time Calculation (min): 9 min  Charges: OT General Charges $OT Visit: 1 Visit OT Treatments $Self Care/Home Management : 8-22 mins  Gerre Kraft, OTD OTR/L  12/05/23, 3:02 PM

## 2023-12-05 NOTE — Progress Notes (Addendum)
 Progress Note   [name] ZHY:[id number] DOB: April 08, 1941 DOA: 12/01/2023     3 DOS: the patient was seen and examined on 12/05/2023   Brief hospital course: HPI on admission by Dr. Vallarie Gauze:  " [name] is a 83 y.o. female with medical history significant for HTN, HLD, COPD, HFpEF( EF 55-60% 11/16/2023), alcohol use disorder, chronic back pain, neuropathy, recently admitted from 4/1 to 4/4 with symptomatic anemia requiring 3 units PRBCs, complicated by acute encephalopathy attributed to delirium versus alcohol abuse versus baseline dementia, referred for outpatient GI workup and IV iron  with hematology, with good being admitted with a syncopal episode that occurred during the course of a colonoscopy prep. Tomorrow.  Patient was found sitting outside confused.  Per triage note, there was evidence of a trail of fecal incontinence from where she was sitting to inside the house and within the house.  Patient is currently oriented to person and place and is not sure about the circumstances but states she must have passed out. ED course and data review:Vitals within normal limits. Labs notable for AKI with creatinine 3.28 up from 1.03 a couple weeks prior. Hemoglobin 10.5 up from 9.3 a couple weeks aago. K+ 2.8 WBC and lactic acid normal Etoh <10 EKG with NSR at 67 CT head non acute "  Patient was admitted for further evaluation and management of acute metabolic encephalopathy and question of possible syncopal episode and AKI.  GI was consulted on admission, they do not plan for endoscopy before Monday, if acute issues are improved.   Further hospital course and management as outlined below.  4/19 -- Not UA appears grossly positive for UTI.  Started empiric IV Rocephin  and added on a urine culture.  Pt more alert but remains very confused, oriented only to self. 4/20 -- mental status improved somewhat, baseline unclear.  Cr improving.  Urine culture +E coli, remains on empiric  Rocephin .  No plan for endoscopy tomorrow per GI, needs further clinical improvement before bowel prep for procedure. 4/21 -- changed to PO Keflex .   4/22 -- GI started bowel prep, endoscopies tomorrow   Assessment and Plan:  UTI - POA.  Not septic. 4/20 -- urine culture grew pan-sensitive E.coli --Treated with empiric IV Rocephin  x 3 days --Transitioned to Keflex  to complete 5 day course --Monitor fever curve, CBC  Possible Syncope - in setting of pt doing bowel prep which appears she started too early, since scope was scheduled for Monday Acute metabolic encephalopathy - due to UTI, ?dehydration, in combination with possible alcohol use. CT head non-acute. No focal neurologic deficits. History from [name], patient's cousin - no history of dementia - pt lives independently, drives & manages ADL's etc without assistance.  Pt does have significant drinking history however. --Check thiamine  level --Empiric high dose IV thiamine  500 mg IV TID --Consider MRI brain --s/p IV fluid resuscitation --Serial H&H --Neurologic checks with fall and aspiration precautions --Patient had recent cardiac clearance in preparation for colonoscopy --Telemetry monitoring, no further cardiac evaluation indicated at this time --Monitor closely --Delirium precautions   AKI (acute kidney injury)  Baseline Cr 1.0-1.1. Admitted with Cr 3.28 with pre-renal azotemia Likely secondary to dehydration from bowel prep  Cr trend: 3.28 >> 2.41 >> 1.53 >> 1.33 >> 1.24 --s/p IV fluid resuscitation --Encourage PO hydration --Monitor renal function and avoid nephrotoxins   Dementia,  --Delirium precautions   Symptomatic anemia Presumed GI.  Had recent transfusion of 3 units PRBCs and Venofer  --GI consulted,  plan for EGD and colonoscopy tomorrow 4/23 --Clear liquid diet per GI -- diet orders per GI --Bowel prep started today  --Monitor CBC --Transfuse RBC's if Hbg < 7 or < 8 if signs of active bleeding   COPD  (chronic obstructive pulmonary disease) (HCC) Not acutely exacerbated --As needed albuterol    Chronic systolic heart failure (HCC) EF 55 to 60% with G1 DD.  History of systolic heart failure --Lisinopril  was held due to AKI on admission - resumed   Alcohol use disorder EtOH level undetectable on admission Pt's cousin reports history of drinking wine 4-5+ glasses per day when she is drinking, but looked around pt's home and garbage cans and did not see evidence of recent drinking. --Cancelled CIWA protocol   HTN (hypertension) BP's elevated. Lisinopril  has been on hold since admission due to AKI. Renal function has improved. --Resumed home lisinopril  40 mg daily --PRN IV hydralazine  --Titrate regimen      Subjective: Pt reports wheezing this AM.  She otherwise says she feels well and denies complaints. Remains confused, oriented to self and hospital, not year or situation.       Physical Exam: Vitals:   12/05/23 0349 12/05/23 0500 12/05/23 0908 12/05/23 1310  BP: (!) 167/78  (!) 159/50 (!) 144/62  Pulse: 78  81 88  Resp: 20     Temp: 97.9 F (36.6 C)  97.6 F (36.4 C)   TempSrc:      SpO2: 97%  95% 96%  Weight:  99.1 kg    Height:       General exam: awake, alert, no acute distress, obese, pleasantly confused HEENT: moist mucus membranes, hearing grossly normal  Respiratory system: expiratory wheezes, normal respiratory effort on room air. Cardiovascular system: RRR,no peripheral edema, normal S1/S2 Gastrointestinal system: soft, NT, ND Central nervous system: A&O x self and hospital, not year or situation. no gross focal neurologic deficits, normal speech Extremities: moves all, no edema, normal tone Psychiatry: normal mood, congruent affect, confused, abnormal judgment and insight   Data Reviewed:  Notable lab s--  Cl 112. Cr improved 1.24   Last Hbg 9.0 >> 9.2 stable (4/21)   Family Communication: [name] was updated in detail by phone this  afternoon.     Disposition: Status is: Inpatient Remains inpatient appropriate because: --GI evaluation tomorrow.   --SNF placement recommended.    Planned Discharge Destination: Home    Time spent: 36 minutes  Author: Montey Apa, DO 12/05/2023 1:26 PM  For on call review [email or web address].

## 2023-12-06 ENCOUNTER — Encounter
Admission: EM | Disposition: A | Discharge status: Skilled Nursing Facility | Payer: Self-pay | Source: Home / Self Care | Attending: Internal Medicine

## 2023-12-06 ENCOUNTER — Encounter: Payer: Self-pay | Admitting: Internal Medicine

## 2023-12-06 ENCOUNTER — Inpatient Hospital Stay

## 2023-12-06 DIAGNOSIS — D509 Iron deficiency anemia, unspecified: Secondary | ICD-10-CM

## 2023-12-06 DIAGNOSIS — K641 Second degree hemorrhoids: Secondary | ICD-10-CM | POA: Diagnosis not present

## 2023-12-06 DIAGNOSIS — D123 Benign neoplasm of transverse colon: Secondary | ICD-10-CM | POA: Diagnosis not present

## 2023-12-06 DIAGNOSIS — K5730 Diverticulosis of large intestine without perforation or abscess without bleeding: Secondary | ICD-10-CM | POA: Diagnosis not present

## 2023-12-06 DIAGNOSIS — D649 Anemia, unspecified: Secondary | ICD-10-CM

## 2023-12-06 DIAGNOSIS — K922 Gastrointestinal hemorrhage, unspecified: Secondary | ICD-10-CM

## 2023-12-06 DIAGNOSIS — K449 Diaphragmatic hernia without obstruction or gangrene: Secondary | ICD-10-CM | POA: Diagnosis not present

## 2023-12-06 HISTORY — PX: GIVENS CAPSULE STUDY: SHX5432

## 2023-12-06 HISTORY — PX: ESOPHAGOGASTRODUODENOSCOPY: SHX5428

## 2023-12-06 HISTORY — PX: COLONOSCOPY: SHX5424

## 2023-12-06 LAB — GLUCOSE, CAPILLARY
Glucose-Capillary: 84 mg/dL (ref 70–99)
Glucose-Capillary: 85 mg/dL (ref 70–99)

## 2023-12-06 LAB — BASIC METABOLIC PANEL WITH GFR
Anion gap: 7 (ref 5–15)
BUN: 18 mg/dL (ref 8–23)
CO2: 23 mmol/L (ref 22–32)
Calcium: 8.7 mg/dL — ABNORMAL LOW (ref 8.9–10.3)
Chloride: 110 mmol/L (ref 98–111)
Creatinine, Ser: 1.26 mg/dL — ABNORMAL HIGH (ref 0.44–1.00)
GFR, Estimated: 43 mL/min — ABNORMAL LOW (ref >60)
Glucose, Bld: 86 mg/dL (ref 70–99)
Potassium: 3.4 mmol/L — ABNORMAL LOW (ref 3.5–5.1)
Sodium: 140 mmol/L (ref 135–145)

## 2023-12-06 LAB — MAGNESIUM: Magnesium: 1.6 mg/dL — ABNORMAL LOW (ref 1.7–2.4)

## 2023-12-06 SURGERY — COLONOSCOPY
Anesthesia: General

## 2023-12-06 MED ORDER — LIDOCAINE HCL (PF) 2 % IJ SOLN
INTRAMUSCULAR | Status: AC
Start: 2023-12-06 — End: ?
  Filled 2023-12-06: qty 5

## 2023-12-06 MED ORDER — PROPOFOL 10 MG/ML IV BOLUS
INTRAVENOUS | Status: DC | PRN
  Administered 2023-12-06: 80 mg via INTRAVENOUS
  Administered 2023-12-06: 10 mg via INTRAVENOUS
  Administered 2023-12-06: 20 mg via INTRAVENOUS

## 2023-12-06 MED ORDER — ZIPRASIDONE MESYLATE 20 MG IM SOLR
20.0000 mg | Freq: Once | INTRAMUSCULAR | Status: AC
Start: 2023-12-06 — End: 2023-12-06
  Administered 2023-12-06: 20 mg via INTRAMUSCULAR
  Filled 2023-12-06: qty 20

## 2023-12-06 MED ORDER — LIDOCAINE HCL (CARDIAC) PF 100 MG/5ML IV SOSY
PREFILLED_SYRINGE | INTRAVENOUS | Status: DC | PRN
  Administered 2023-12-06: 100 mg via INTRAVENOUS

## 2023-12-06 MED ORDER — SODIUM CHLORIDE 0.9 % IV SOLN: INTRAVENOUS | Status: DC | PRN

## 2023-12-06 MED ORDER — ORAL CARE MOUTH RINSE: 15.0000 mL | OROMUCOSAL | Status: DC | PRN

## 2023-12-06 MED ORDER — GLYCOPYRROLATE PF 0.2 MG/ML IJ SOSY
PREFILLED_SYRINGE | INTRAMUSCULAR | Status: DC | PRN
  Administered 2023-12-06: .2 mg via INTRAVENOUS

## 2023-12-06 MED ORDER — POTASSIUM CHLORIDE 10 MEQ/100ML IV SOLN
10.0000 meq | INTRAVENOUS | Status: DC
  Filled 2023-12-06 (×3): qty 100

## 2023-12-06 MED ORDER — POTASSIUM CHLORIDE CRYS ER 20 MEQ PO TBCR: 40.0000 meq | EXTENDED_RELEASE_TABLET | Freq: Once | ORAL | Status: DC

## 2023-12-06 MED ORDER — POTASSIUM CHLORIDE 10 MEQ/100ML IV SOLN
10.0000 meq | INTRAVENOUS | Status: DC
  Filled 2023-12-06 (×2): qty 100

## 2023-12-06 MED ORDER — POTASSIUM CHLORIDE CRYS ER 20 MEQ PO TBCR
40.0000 meq | EXTENDED_RELEASE_TABLET | Freq: Once | ORAL | Status: AC
  Administered 2023-12-06: 40 meq via ORAL
  Filled 2023-12-06: qty 2

## 2023-12-06 MED ORDER — CEPHALEXIN 500 MG PO CAPS
500.0000 mg | ORAL_CAPSULE | Freq: Three times a day (TID) | ORAL | Status: AC
  Administered 2023-12-06 – 2023-12-07 (×2): 500 mg via ORAL
  Filled 2023-12-06 (×2): qty 1

## 2023-12-06 NOTE — Anesthesia Preprocedure Evaluation (Signed)
 Anesthesia Evaluation  Patient identified by MRN, date of birth, ID band Patient confused    Reviewed: Allergy & Precautions, NPO status , Patient's Chart, lab work & pertinent test results  History of Anesthesia Complications Negative for: history of anesthetic complications  Airway Mallampati: III  TM Distance: <3 FB Neck ROM: full    Dental  (+) Chipped   Pulmonary neg shortness of breath, COPD, former smoker   Pulmonary exam normal        Cardiovascular Exercise Tolerance: Good hypertension, +CHF  Normal cardiovascular exam     Neuro/Psych  PSYCHIATRIC DISORDERS     Dementia  Neuromuscular disease CVA, Residual Symptoms    GI/Hepatic Neg liver ROS, hiatal hernia,GERD  Controlled,,  Endo/Other  negative endocrine ROS    Renal/GU Renal disease  negative genitourinary   Musculoskeletal   Abdominal   Peds  Hematology negative hematology ROS (+)   Anesthesia Other Findings Past Medical History: No date: (HFpEF) heart failure with preserved ejection fraction (HCC)     Comment:  a.) TTE 07/22/2018: EF 45-50%; mild LVH; mild diffuse               HK; mild LA dilation; PASP 30-35 mmHg; G1DD. No date: Alcohol abuse No date: Alcohol dependence with alcohol-induced mood disorder (HCC) No date: Anemia No date: Aortic atherosclerosis (HCC) No date: Arthritis 10/28/2016: Brief psychotic disorder (HCC)     Comment:  a.) auditory hallucinations (people arguing in home who               were not there); visual hallucinations (seeing dead               husband); patient was involuntarily committed/admitted to              [facility]. No date: CCC (chronic calculous cholecystitis) No date: CKD (chronic kidney disease), stage III (HCC) No date: COPD (chronic obstructive pulmonary disease) (HCC) No date: Dementia (HCC) No date: Diverticulosis No date: Dyslipidemia No date: Dyspnea on exertion No date: Frequent falls No date:  GERD (gastroesophageal reflux disease) No date: Hepatic steatosis No date: Hiatal hernia No date: History of kidney stones No date: HTN (hypertension) No date: Lumbar spondylosis No date: MDD (major depressive disorder) No date: PFO (patent foramen ovale) No date: Pneumonia No date: Retinal artery occlusion No date: Sleep difficulties     Comment:  a.) takes melatonin + trazodone  No date: Stroke [facility])     Comment:  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 No date: Weakness  Past Surgical History: No date: ANKLE ARTHROSCOPY WITH OPEN REDUCTION INTERNAL FIXATION  (ORIF); Right No date: APPENDECTOMY No date: CHOLECYSTECTOMY 07/21/2018: COLONOSCOPY; N/A     Comment:  Procedure: COLONOSCOPY;  Surgeon: Selena Daily,               MD;  Location: [facility] ENDOSCOPY;  Service:               Gastroenterology;  Laterality: N/A; 11/20/2023: COLONOSCOPY; N/A     Comment:  Procedure: COLONOSCOPY;  Surgeon: Marnee Sink, MD;                Location: [facility] ENDOSCOPY;  Service: Endoscopy;                Laterality: N/A; 11/27/2023: COLONOSCOPY; N/A     Comment:  Procedure: COLONOSCOPY;  Surgeon: Marnee Sink, MD;                Location: [facility] ENDOSCOPY;  Service: Endoscopy;  Laterality: N/A; 04/01/2020: COLONOSCOPY WITH PROPOFOL ; N/A     Comment:  Procedure: COLONOSCOPY WITH PROPOFOL ;  Surgeon:               Irby Mannan, MD;  Location: [facility] ENDOSCOPY;                Service: Endoscopy;  Laterality: N/A; 07/21/2018: ESOPHAGOGASTRODUODENOSCOPY; N/A     Comment:  Procedure: ESOPHAGOGASTRODUODENOSCOPY (EGD);  Surgeon:               Selena Daily, MD;  Location: [facility] ENDOSCOPY;                Service: Gastroenterology;  Laterality: N/A; 02/11/2020: ESOPHAGOGASTRODUODENOSCOPY; N/A     Comment:  Procedure: ESOPHAGOGASTRODUODENOSCOPY (EGD);  Surgeon:               Irby Mannan, MD;  Location: [facility] ENDOSCOPY;                Service: Endoscopy;  Laterality: N/A; 11/20/2023:  ESOPHAGOGASTRODUODENOSCOPY; N/A     Comment:  Procedure: EGD (ESOPHAGOGASTRODUODENOSCOPY);  Surgeon:               Marnee Sink, MD;  Location: [facility] ENDOSCOPY;  Service:               Endoscopy;  Laterality: N/A; 11/27/2023: ESOPHAGOGASTRODUODENOSCOPY; N/A     Comment:  Procedure: EGD (ESOPHAGOGASTRODUODENOSCOPY);  Surgeon:               Marnee Sink, MD;  Location: [facility] ENDOSCOPY;  Service:               Endoscopy;  Laterality: N/A; 04/01/2020: ESOPHAGOGASTRODUODENOSCOPY (EGD) WITH PROPOFOL ; N/A     Comment:  Procedure: ESOPHAGOGASTRODUODENOSCOPY (EGD) WITH               PROPOFOL ;  Surgeon: Irby Mannan, MD;  Location:               [facility] ENDOSCOPY;  Service: Endoscopy;  Laterality: N/A; No date: FRACTURE SURGERY; Left     Comment:  leg fx 10/22/2021: IR EXCHANGE BILIARY DRAIN 09/17/2021: IR PERC CHOLECYSTOSTOMY No date: TUBAL LIGATION  BMI    Body Mass Index: 34.29 kg/m      Reproductive/Obstetrics negative OB ROS                             Anesthesia Physical Anesthesia Plan  ASA: 3  Anesthesia Plan: General   Post-op Pain Management:    Induction: Intravenous  PONV Risk Score and Plan: Propofol  infusion and TIVA  Airway Management Planned: Natural Airway and Nasal Cannula  Additional Equipment:   Intra-op Plan:   Post-operative Plan:   Informed Consent: I have reviewed the patients History and Physical, chart, labs and discussed the procedure including the risks, benefits and alternatives for the proposed anesthesia with the patient or authorized representative who has indicated his/her understanding and acceptance.     Dental Advisory Given  Plan Discussed with: Anesthesiologist, CRNA and Surgeon  Anesthesia Plan Comments: (History and phone consent from the patients daughter Maurene Sours at [phone]  Daughter consented for risks of anesthesia including but not limited to:  - adverse reactions to medications - risk  of airway placement if required - damage to eyes, teeth, lips or other oral mucosa - nerve damage due to positioning  - sore throat or hoarseness - Damage to heart, brain, nerves, lungs, other parts of body  or loss of life  She voiced understanding and assent.)       Anesthesia Quick Evaluation

## 2023-12-06 NOTE — Plan of Care (Signed)
  Problem: Clinical Measurements: Goal: Will remain free from infection Outcome: Progressing   Problem: Clinical Measurements: Goal: Respiratory complications will improve Outcome: Progressing   Problem: Elimination: Goal: Will not experience complications related to bowel motility Outcome: Progressing   Problem: Elimination: Goal: Will not experience complications related to urinary retention Outcome: Progressing   Problem: Pain Managment: Goal: General experience of comfort will improve and/or be controlled Outcome: Progressing   Problem: Safety: Goal: Ability to remain free from injury will improve Outcome: Progressing

## 2023-12-06 NOTE — Transfer of Care (Signed)
 Immediate Anesthesia Transfer of Care Note  [name]  Procedure(s) Performed: COLONOSCOPY EGD (ESOPHAGOGASTRODUODENOSCOPY)  Patient Location: Endoscopy Unit  Anesthesia Type:General  Level of Consciousness: drowsy  Airway & Oxygen  Therapy: Patient Spontanous Breathing  Post-op Assessment: Report given to RN and Post -op Vital signs reviewed and stable  Post vital signs: Reviewed and stable  Last Vitals:  Vitals Value Taken Time  BP 123/52 12/06/23 1510  Temp 35.9 1510  Pulse 94 12/06/23 1511  Resp 29 12/06/23 1511  SpO2 100 % 12/06/23 1511  Vitals shown include unfiled device data.  Last Pain:  Vitals:   12/06/23 1510  TempSrc:   PainSc: Asleep      Patients Stated Pain Goal: 0 (12/05/23 0000)  Complications: No notable events documented.

## 2023-12-06 NOTE — Progress Notes (Signed)
 Progress Note   [name] ZOX:[id number] DOB: 1941-02-28 DOA: 12/01/2023     4 DOS: the patient was seen and examined on 12/06/2023     Brief hospital course: HPI on admission by Dr. Vallarie Gauze:  " Virginia Walters is a 83 y.o. female with medical history significant for HTN, HLD, COPD, HFpEF( EF 55-60% 11/16/2023), alcohol use disorder, chronic back pain, neuropathy, recently admitted from 4/1 to 4/4 with symptomatic anemia requiring 3 units PRBCs, complicated by acute encephalopathy attributed to delirium versus alcohol abuse versus baseline dementia, referred for outpatient GI workup and IV iron  with hematology, with good being admitted with a syncopal episode that occurred during the course of a colonoscopy prep. Tomorrow.  Patient was found sitting outside confused.  Per triage note, there was evidence of a trail of fecal incontinence from where she was sitting to inside the house and within the house.  Patient is currently oriented to person and place and is not sure about the circumstances but states she must have passed out. ED course and data review:Vitals within normal limits. Labs notable for AKI with creatinine 3.28 up from 1.03 a couple weeks prior. Hemoglobin 10.5 up from 9.3 a couple weeks aago. K+ 2.8 WBC and lactic acid normal Etoh <10 EKG with NSR at 67 CT head non acute "   Patient was admitted for further evaluation and management of acute metabolic encephalopathy and question of possible syncopal episode and AKI.  GI was consulted on admission, they do not plan for endoscopy before Monday, if acute issues are improved.    Further hospital course and management as outlined below.   4/19 -- Not UA appears grossly positive for UTI.  Started empiric IV Rocephin  and added on a urine culture.  Pt more alert but remains very confused, oriented only to self. 4/20 -- mental status improved somewhat, baseline unclear.  Cr improving.  Urine culture +E coli, remains on  empiric Rocephin .  No plan for endoscopy tomorrow per GI, needs further clinical improvement before bowel prep for procedure. 4/21 -- changed to PO Keflex .   4/22 -- GI started bowel prep, endoscopies tomorrow     Assessment and Plan:   UTI - POA.  Not septic. 4/20 -- urine culture grew pan-sensitive E.coli --Treated with empiric IV Rocephin  x 3 days --Transitioned to Keflex  to complete 5 day course --Monitor fever curve, CBC   Possible Syncope - in setting of pt doing bowel prep which appears she started too early, since scope was scheduled for Monday Acute metabolic encephalopathy - due to UTI, ?dehydration, in combination with possible alcohol use. CT head non-acute. No focal neurologic deficits. History from [name], patient's cousin - no history of dementia - pt lives independently, drives & manages ADL's etc without assistance.  Pt does have significant drinking history however. --Check thiamine  level --Empiric high dose IV thiamine  500 mg IV TID --Consider MRI brain --s/p IV fluid resuscitation --Serial H&H --Neurologic checks with fall and aspiration precautions --Patient had recent cardiac clearance in preparation for colonoscopy --Telemetry monitoring, no further cardiac evaluation indicated at this time --Monitor closely --Delirium precautions   AKI (acute kidney injury)  Baseline Cr 1.0-1.1. Admitted with Cr 3.28 with pre-renal azotemia Monitor renal function closely  Dementia,  --Delirium precautions   Symptomatic anemia Presumed GI.  Had recent transfusion of 3 units PRBCs and Venofer  --GI consulted, plan for EGD and colonoscopy tomorrow 4/23 --Clear liquid diet per GI -- diet orders per GI Monitor hemoglobin closely  and transfuse as needed to maintain hemoglobin greater than 8   COPD (chronic obstructive pulmonary disease) (HCC) Not acutely exacerbated --As needed albuterol    Chronic systolic heart failure (HCC) EF 55 to 60% with G1 DD.  History of systolic  heart failure --Lisinopril  was held due to AKI on admission - resumed   Alcohol use disorder EtOH level undetectable on admission Pt's cousin reports history of drinking wine 4-5+ glasses per day when she is drinking, but looked around pt's home and garbage cans and did not see evidence of recent drinking. --Cancelled CIWA protocol   HTN (hypertension) BP's elevated. Lisinopril  has been on hold since admission due to AKI. Renal function has improved. --Resumed home lisinopril  40 mg daily Continue as needed hydralazine        Subjective:  Patient seen and examined at bedside this morning Being planned for GI for endoscopic evaluation Denies nausea vomiting chest pain or cough   Physical Exam: General exam: awake, alert, no acute distress, obese, pleasantly confused HEENT: moist mucus membranes, hearing grossly normal  Respiratory system: expiratory wheezes, normal respiratory effort on room air. Cardiovascular system: RRR,no peripheral edema, normal S1/S2 Gastrointestinal system: soft, NT, ND Central nervous system: A&O x self and hospital, not year or situation. no gross focal neurologic deficits, normal speech Extremities: moves all, no edema, normal tone Psychiatry: normal mood, congruent affect, confused, abnormal judgment and insight     Data Reviewed:    Family Communication: Patient's cousin [name] was updated in detail by phone this afternoon.     Disposition: Status is: Inpatient Remains inpatient appropriate because: --GI evaluation tomorrow.   --SNF placement recommended.      Planned Discharge Destination: Home       Time spent: 39 minutes    Vitals:   12/06/23 1510 12/06/23 1521 12/06/23 1542 12/06/23 1642  BP: (!) 123/52 124/61 (!) 160/84 (!) 165/79  Pulse: 92 (!) 104 100 83  Resp: 20 18 20 18   Temp:    97.6 F (36.4 C)  TempSrc:      SpO2: 97% 93% 96% 96%  Weight:      Height:          Latest Ref Rng & Units 12/04/2023    4:19 AM 12/03/2023     4:25 AM 12/01/2023    5:46 PM  CBC  WBC 4.0 - 10.5 K/uL 5.0  5.2  6.0   Hemoglobin 12.0 - 15.0 g/dL 9.2  9.0  16.1   Hematocrit 36.0 - 46.0 % 29.1  28.3  34.6   Platelets 150 - 400 K/uL 231  208  236      Author: Ezzard Holms, MD 12/06/2023 5:49 PM  For on call review [email or web address].

## 2023-12-06 NOTE — Op Note (Signed)
 [facility] Patient Name: Virginia Walters Procedure Date: 12/06/2023 2:38 PM MRN: [id number] Account #: [id number] Date of Birth: 05/28/1941 Admit Type: Outpatient Age: 83 Room: [facility] ENDO ROOM 2 Gender: Female Note Status: Finalized Instrument Name: Hyman Main [id number] Procedure:             Colonoscopy Indications:           Iron  deficiency anemia Providers:             Marnee Sink MD, MD Medicines:             Propofol  per Anesthesia Complications:         No immediate complications. Procedure:             Pre-Anesthesia Assessment:                        - Prior to the procedure, a History and Physical was                         performed, and patient medications and allergies were                         reviewed. The patient's tolerance of previous                         anesthesia was also reviewed. The risks and benefits                         of the procedure and the sedation options and risks                         were discussed with the patient. All questions were                         answered, and informed consent was obtained. Prior                         Anticoagulants: The patient has taken no anticoagulant                         or antiplatelet agents. ASA Grade Assessment: III - A                         patient with severe systemic disease. After reviewing                         the risks and benefits, the patient was deemed in                         satisfactory condition to undergo the procedure.                        After obtaining informed consent, the colonoscope was                         passed under direct vision. Throughout the procedure,                         the patient's blood pressure, pulse,  and oxygen                          saturations were monitored continuously. The                         Colonoscope was introduced through the anus and                         advanced to the the cecum,  identified by appendiceal                         orifice and ileocecal valve. The colonoscopy was                         performed without difficulty. The patient tolerated                         the procedure well. The quality of the bowel                         preparation was excellent. Findings:      The perianal and digital rectal examinations were normal.      An 8 mm polyp was found in the proximal transverse colon. The polyp was       sessile. The polyp was removed with a cold snare. Resection and       retrieval were complete.      Multiple small-mouthed diverticula were found in the entire colon.      Non-bleeding internal hemorrhoids were found during retroflexion. The       hemorrhoids were Grade II (internal hemorrhoids that prolapse but reduce       spontaneously). Impression:            - One 8 mm polyp in the proximal transverse colon,                         removed with a cold snare. Resected and retrieved.                        - Diverticulosis in the entire examined colon.                        - Non-bleeding internal hemorrhoids. Recommendation:        - Return patient to hospital ward for ongoing care.                        - Resume previous diet.                        - Continue present medications.                        - Await pathology results.                        - Perform an upper GI endoscopy today. Procedure Code(s):     --- Professional ---                        [phone], Colonoscopy, flexible; with removal  of                         tumor(s), polyp(s), or other lesion(s) by snare                         technique Diagnosis Code(s):     --- Professional ---                        D50.9, Iron  deficiency anemia, unspecified                        D12.3, Benign neoplasm of transverse colon (hepatic                         flexure or splenic flexure) CPT copyright 2022 American Medical Association. All rights reserved. The codes documented in this  report are preliminary and upon coder review may  be revised to meet current compliance requirements. Marnee Sink MD, MD 12/06/2023 3:08:20 PM This report has been signed electronically. Number of Addenda: 0 Note Initiated On: 12/06/2023 2:38 PM Scope Withdrawal Time: 0 hours 4 minutes 32 seconds  Total Procedure Duration: 0 hours 11 minutes 42 seconds  Estimated Blood Loss:  Estimated blood loss: none.      [facility]

## 2023-12-06 NOTE — Op Note (Signed)
 [facility] Patient Name: Virginia Walters Procedure Date: 12/06/2023 2:38 PM MRN: [id number] Account #: [id number] Date of Birth: 1941/06/02 Admit Type: Outpatient Age: 83 Room: [facility] ENDO ROOM 1 Gender: Female Note Status: Finalized Instrument Name: Cristino Donna Endoscope [id number] Procedure:             Upper GI endoscopy Indications:           Iron  deficiency anemia Providers:             Marnee Sink MD, MD Medicines:             Propofol  per Anesthesia Complications:         No immediate complications. Procedure:             Pre-Anesthesia Assessment:                        - Prior to the procedure, a History and Physical was                         performed, and patient medications and allergies were                         reviewed. The patient's tolerance of previous                         anesthesia was also reviewed. The risks and benefits                         of the procedure and the sedation options and risks                         were discussed with the patient. All questions were                         answered, and informed consent was obtained. Prior                         Anticoagulants: The patient has taken no anticoagulant                         or antiplatelet agents. ASA Grade Assessment: III - A                         patient with severe systemic disease. After reviewing                         the risks and benefits, the patient was deemed in                         satisfactory condition to undergo the procedure.                        After obtaining informed consent, the endoscope was                         passed under direct vision. Throughout the procedure,                         the patient's  blood pressure, pulse, and oxygen                          saturations were monitored continuously. The Endoscope                         was introduced through the mouth, and advanced to the                         second  part of duodenum. The upper GI endoscopy was                         accomplished without difficulty. The patient tolerated                         the procedure well. Findings:      A large hiatal hernia was present.      The stomach was normal.      The examined duodenum was normal. Impression:            - Large hiatal hernia.                        - Normal stomach.                        - Normal examined duodenum.                        - No specimens collected. Recommendation:        - Return patient to hospital ward for ongoing care.                        - Resume previous diet.                        - Continue present medications.                        - Perform a colonoscopy today. Procedure Code(s):     --- Professional ---                        [phone], Esophagogastroduodenoscopy, flexible,                         transoral; diagnostic, including collection of                         specimen(s) by brushing or washing, when performed                         (separate procedure) Diagnosis Code(s):     --- Professional ---                        D50.9, Iron  deficiency anemia, unspecified CPT copyright 2022 American Medical Association. All rights reserved. The codes documented in this report are preliminary and upon coder review may  be revised to meet current compliance requirements. Marnee Sink MD, MD 12/06/2023 2:51:21 PM This report has been signed electronically. Number of Addenda: 0 Note Initiated On: 12/06/2023 2:38 PM Estimated Blood Loss:  Estimated blood loss: none.      [facility]

## 2023-12-06 NOTE — Progress Notes (Signed)
 Provided report to Paola Bohr, RN and informed of Dr. Curtis Dow order to have patient complete capsule study. Patient swallowed capsule without issue and equipment was applied. RN provided written instructions related to capsule study including times to hold food/water (until 1930), time to allow clear liquids (between 1930-2130) and when to permit a light meal (after 2130). Equipment can be removed from patient after 0200 on December 07, 2023.

## 2023-12-06 NOTE — Progress Notes (Signed)
 PT Cancellation Note  Patient Details Name: Virginia Walters MRN: [id number] DOB: [birth year]   Cancelled Treatment:    Reason Eval/Treat Not Completed: Patient at procedure or test/unavailable (Pt OTF for GI study, will resume services at later date/time as pt is available.)  5:07 PM, 12/06/23 Dawn Eth, PT, DPT Physical Therapist - [facility]  [phone] (ASCOM)    [name] C 12/06/2023, 5:07 PM
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
## 2023-12-07 ENCOUNTER — Encounter: Payer: Self-pay | Admitting: Gastroenterology

## 2023-12-07 DIAGNOSIS — K2971 Gastritis, unspecified, with bleeding: Secondary | ICD-10-CM | POA: Diagnosis not present

## 2023-12-07 LAB — CBC WITH DIFFERENTIAL/PLATELET
Abs Immature Granulocytes: 0.02 10*3/uL (ref 0.00–0.07)
Basophils Absolute: 0.1 10*3/uL (ref 0.0–0.1)
Basophils Relative: 2 %
Eosinophils Absolute: 0.8 10*3/uL — ABNORMAL HIGH (ref 0.0–0.5)
Eosinophils Relative: 13 %
HCT: 31.8 % — ABNORMAL LOW (ref 36.0–46.0)
Hemoglobin: 9.7 g/dL — ABNORMAL LOW (ref 12.0–15.0)
Immature Granulocytes: 0 %
Lymphocytes Relative: 18 %
Lymphs Abs: 1.1 10*3/uL (ref 0.7–4.0)
MCH: 24.3 pg — ABNORMAL LOW (ref 26.0–34.0)
MCHC: 30.5 g/dL (ref 30.0–36.0)
MCV: 79.7 fL — ABNORMAL LOW (ref 80.0–100.0)
Monocytes Absolute: 0.8 10*3/uL (ref 0.1–1.0)
Monocytes Relative: 13 %
Neutro Abs: 3.4 10*3/uL (ref 1.7–7.7)
Neutrophils Relative %: 54 %
Platelets: 222 10*3/uL (ref 150–400)
RBC: 3.99 MIL/uL (ref 3.87–5.11)
RDW: 21.2 % — ABNORMAL HIGH (ref 11.5–15.5)
Smear Review: NORMAL
WBC: 6.2 10*3/uL (ref 4.0–10.5)
nRBC: 0 % (ref 0.0–0.2)

## 2023-12-07 LAB — SURGICAL PATHOLOGY

## 2023-12-07 LAB — BASIC METABOLIC PANEL WITH GFR
Anion gap: 9 (ref 5–15)
BUN: 18 mg/dL (ref 8–23)
CO2: 23 mmol/L (ref 22–32)
Calcium: 8.8 mg/dL — ABNORMAL LOW (ref 8.9–10.3)
Chloride: 112 mmol/L — ABNORMAL HIGH (ref 98–111)
Creatinine, Ser: 1.43 mg/dL — ABNORMAL HIGH (ref 0.44–1.00)
GFR, Estimated: 37 mL/min — ABNORMAL LOW (ref >60)
Glucose, Bld: 78 mg/dL (ref 70–99)
Potassium: 4.2 mmol/L (ref 3.5–5.1)
Sodium: 144 mmol/L (ref 135–145)

## 2023-12-07 LAB — VITAMIN B1: Vitamin B1 (Thiamine): 86.4 nmol/L (ref 66.5–200.0)

## 2023-12-07 LAB — GLUCOSE, CAPILLARY
Glucose-Capillary: 86 mg/dL (ref 70–99)
Glucose-Capillary: 133 mg/dL — ABNORMAL HIGH (ref 70–99)

## 2023-12-07 MED ORDER — THIAMINE HCL 100 MG/ML IJ SOLN
500.0000 mg | Freq: Three times a day (TID) | INTRAVENOUS | Status: AC
  Administered 2023-12-07 – 2023-12-10 (×7): 500 mg via INTRAVENOUS
  Filled 2023-12-07 (×10): qty 5

## 2023-12-07 NOTE — Plan of Care (Signed)
  Problem: Clinical Measurements: Goal: Respiratory complications will improve Outcome: Progressing   Problem: Elimination: Goal: Will not experience complications related to bowel motility Outcome: Progressing   Problem: Elimination: Goal: Will not experience complications related to urinary retention Outcome: Progressing   Problem: Pain Managment: Goal: General experience of comfort will improve and/or be controlled Outcome: Progressing   Problem: Safety: Goal: Ability to remain free from injury will improve Outcome: Progressing

## 2023-12-07 NOTE — Progress Notes (Signed)
 Occupational Therapy Treatment Patient Details Name: ABBEE Walters MRN: [id number] DOB: 1941-05-18 Today's Date: 12/07/2023   History of present illness Pt is an 83 y.o. female presenting to hospital 12/01/23 after being found down in her yard by EMS after possible syncopal episode; incontinent of stool and urine; pt was doing colonoscopy prep.  Pt admitted with UTI, possible syncope, acute metabolic encephalopathy, AKI, symptomatic anemia.  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  Recent hospitalization 4/1 to 11/17/23 with symptomatic anemia requiring 3 units PRBC's complicated by acute encephalopathy.   OT comments  Chart reviewed, pt greeted in bed, oriented self, place, date; not oriented to situation. Pt is tangential throughout with poor safety awareness. Improvements noted in overall mobility, with pt performing bed mobility with MIN A, STS with MIN A, short amb transfer to bedside chair with MIN A, frequent multi modal cues for technique. MAX A required for LB dressing. Pt is left in bedside chair, safety maintained, all needs met. OT will continue to follow.       If plan is discharge home, recommend the following:  A lot of help with bathing/dressing/bathroom;Assistance with cooking/housework;A little help with walking and/or transfers;Supervision due to cognitive status   Equipment Recommendations  Other (comment) (defer to next venue of care)    Recommendations for Other Services      Precautions / Restrictions Precautions Precautions: Fall Recall of Precautions/Restrictions: Impaired Restrictions Weight Bearing Restrictions Per Provider Order: No       Mobility Bed Mobility Overal bed mobility: Needs Assistance Bed Mobility: Supine to Sit     Supine to sit: Min assist, HOB elevated, Used rails     General bed mobility comments: frequent vcs for technique     Transfers Overall transfer level: Needs assistance Equipment used: Rolling walker (2 wheels) Transfers: Sit to/from Stand Sit to Stand: Min assist                 Balance Overall balance assessment: Needs assistance Sitting-balance support: No upper extremity supported, Feet supported Sitting balance-Leahy Scale: Good     Standing balance support: Bilateral upper extremity supported, Reliant on assistive device for balance Standing balance-Leahy Scale: Fair                             ADL either performed or assessed with clinical judgement   ADL Overall ADL's : Needs assistance/impaired     Grooming: Set up;Supervision/safety;Sitting               Lower Body Dressing: Maximal assistance Lower Body Dressing Details (indicate cue type and reason): donn socks Toilet Transfer: Minimal assistance;Rolling walker (2 wheels) Toilet Transfer Details (indicate cue type and reason): simulated to bedside chair, short amb transfer with frequent multi modal cues for RW use         Functional mobility during ADLs: Minimal assistance;Rolling walker (2 wheels) (approx 4' forawrd and back with RW, frequent multi modal cues for RW use)      Extremity/Trunk Assessment              Vision       Perception     Praxis     Communication Communication Communication: No apparent difficulties   Cognition Arousal: Alert Behavior During Therapy: WFL for tasks assessed/performed, Lability (tangential at times) Cognition: Cognition impaired   Orientation impairments:  Situation  Awareness: Intellectual awareness impaired, Online awareness impaired Memory impairment (select all impairments): Short-term memory, Working memory Attention impairment (select first level of impairment): Sustained attention Executive functioning impairment (select all impairments): Reasoning, Problem solving                   Following commands: Impaired Following commands  impaired: Follows one step commands with increased time, Only follows one step commands consistently      Cueing   Cueing Techniques: Gestural cues, Verbal cues, Tactile cues, Visual cues  Exercises Other Exercises Other Exercises: edu re: role of of OT, role of rehab, discharge recommendations    Shoulder Instructions       General Comments vss throughout    Pertinent Vitals/ Pain       Pain Assessment Pain Assessment: No/denies pain  Home Living                                          Prior Functioning/Environment              Frequency  Min 2X/week        Progress Toward Goals  OT Goals(current goals can now be found in the care plan section)  Progress towards OT goals: Progressing toward goals  Acute Rehab OT Goals Time For Goal Achievement: 12/16/23  Plan      Co-evaluation                 AM-PAC OT "6 Clicks" Daily Activity     Outcome Measure   Help from another person eating meals?: A Little Help from another person taking care of personal grooming?: A Little Help from another person toileting, which includes using toliet, bedpan, or urinal?: A Lot Help from another person bathing (including washing, rinsing, drying)?: A Lot Help from another person to put on and taking off regular upper body clothing?: A Little Help from another person to put on and taking off regular lower body clothing?: A Lot 6 Click Score: 15    End of Session Equipment Utilized During Treatment: Rolling walker (2 wheels)  OT Visit Diagnosis: Other abnormalities of gait and mobility (R26.89);Muscle weakness (generalized) (M62.81);Unsteadiness on feet (R26.81)   Activity Tolerance Patient tolerated treatment well   Patient Left in chair;with chair alarm set;with call bell/phone within reach   Nurse Communication Mobility status        Time: 1610-9604 OT Time Calculation (min): 22 min  Charges: OT General Charges $OT Visit: 1 Visit OT  Treatments $Therapeutic Activity: 8-22 mins  Virginia Walters, OTD OTR/L  12/07/23, 3:51 PM

## 2023-12-07 NOTE — Progress Notes (Signed)
 Physical Therapy Treatment Patient Details Name: Virginia Walters MRN: [id number] DOB: [birth year] Today's Date: 12/07/2023   History of Present Illness Pt is an 83 y.o. female presenting to hospital 12/01/23 after being found down in her yard by EMS after possible syncopal episode; incontinent of stool and urine; pt was doing colonoscopy prep.  Pt admitted with UTI, possible syncope, acute metabolic encephalopathy, AKI, symptomatic anemia.  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  Recent hospitalization 4/1 to 11/17/23 with symptomatic anemia requiring 3 units PRBC's complicated by acute encephalopathy.    PT Comments  Pt pleasant but confused t/o session.  She often needed repeated cues and redirection to stay on task and struggled to show much carry-over for most gait and general mobility cues.  She did manage two bouts of ~100 ft of ambulation but was fatigued, had consistent veering and need for direct assist to correct forward leaning and getting walker too far ahead of her.  5xSTS was 71min15sec and she showed limited ability to maintain a fluid consistency with this and t/o seated LE exercises needing almost constant cuing and reinforcement.  Pt will benefit from ongoing PT to address functional limitations, continue with POC.     If plan is discharge home, recommend the following: A little help with walking and/or transfers;A little help with bathing/dressing/bathroom;Assistance with cooking/housework;Assist for transportation;Help with stairs or ramp for entrance   Can travel by private vehicle     Yes  Equipment Recommendations       Recommendations for Other Services       Precautions / Restrictions Precautions Precautions: Fall Recall of Precautions/Restrictions: Impaired Restrictions Weight Bearing Restrictions Per Provider Order: No     Mobility  Bed Mobility                General bed mobility comments: in recliner pre/post session    Transfers Overall transfer level: Needs assistance Equipment used: Rolling walker (2 wheels) Transfers: Sit to/from Stand Sit to Stand: Min assist, Contact guard assist           General transfer comment: unable to rise with self selected strategy but cues for hand and foot placement led to successful standing attempts with only CGA    Ambulation/Gait Ambulation/Gait assistance: Contact guard assist, Min assist Gait Distance (Feet): 100 Feet Assistive device: Rolling walker (2 wheels)         General Gait Details: 2 bouts of ~100 ft with FWW and constant cuing for cadence, posture, walker manipulation/use and generally to insure safety while in hallway.  Pt with consistent forward lean and veer to the R with occasional heavy UE use and need for direct assist to direct/position walker.  O2 dropping to low 90s, HR up to 110s with voiced fatigued with the effort.  No LOBs but poor safety and general awareness t/o the effort.   Stairs             Wheelchair Mobility     Tilt Bed    Modified Rankin (Stroke Patients Only)       Balance Overall balance assessment: Needs assistance Sitting-balance support: No upper extremity supported, Feet supported Sitting balance-Leahy Scale: Good Sitting balance - Comments: steady reaching within BOS   Standing balance support: Bilateral upper extremity supported, Reliant on assistive device for balance Standing balance-Leahy Scale: Fair  Communication Communication Communication: No apparent difficulties  Cognition Arousal: Alert Behavior During Therapy: WFL for tasks assessed/performed   PT - Cognitive impairments: History of cognitive impairments                       PT - Cognition Comments: Pt oriented to self but showed little overall insight of ability to maintain cued instructions Following  commands: Impaired Following commands impaired: Follows one step commands inconsistently    Cueing Cueing Techniques: Gestural cues, Verbal cues, Tactile cues, Visual cues  Exercises General Exercises - Lower Extremity Long Arc Quad: Strengthening, 10 reps Heel Slides: Strengthening, 10 reps Hip ABduction/ADduction: Strengthening, 10 reps Hip Flexion/Marching: Strengthening, 10 reps Other Exercises Other Exercises: 5xSTS = 1:15.8    General Comments General comments (skin integrity, edema, etc.): gait training cues did not appear to have much carry over beyond the moment      Pertinent Vitals/Pain Pain Assessment Pain Assessment: No/denies pain    Home Living                          Prior Function            PT Goals (current goals can now be found in the care plan section) Progress towards PT goals: Progressing toward goals    Frequency    Min 2X/week      PT Plan      Co-evaluation              AM-PAC PT "6 Clicks" Mobility   Outcome Measure  Help needed turning from your back to your side while in a flat bed without using bedrails?: None Help needed moving from lying on your back to sitting on the side of a flat bed without using bedrails?: A Lot Help needed moving to and from a bed to a chair (including a wheelchair)?: A Little Help needed standing up from a chair using your arms (e.g., wheelchair or bedside chair)?: A Little Help needed to walk in hospital room?: A Little Help needed climbing 3-5 steps with a railing? : Total 6 Click Score: 16    End of Session Equipment Utilized During Treatment: Gait belt Activity Tolerance: Patient limited by fatigue Patient left: with call bell/phone within reach;with chair alarm set Nurse Communication: Mobility status;Precautions PT Visit Diagnosis: Other abnormalities of gait and mobility (R26.89);Muscle weakness (generalized) (M62.81);History of falling (Z91.81)     Time: 1610-9604 PT Time  Calculation (min) (ACUTE ONLY): 48 min  Charges:    $Gait Training: 8-22 mins $Therapeutic Exercise: 8-22 mins $Therapeutic Activity: 8-22 mins PT General Charges $$ ACUTE PT VISIT: 1 Visit                     Darice Edelman, DPT 12/07/2023, 6:08 PM

## 2023-12-07 NOTE — Progress Notes (Signed)
 Progress Note   [name] ZOX:[id number] DOB: 1940-11-09 DOA: 12/01/2023     5 DOS: the patient was seen and examined on 12/07/2023      Brief hospital course: HPI on admission by Dr. Vallarie Gauze:  " Virginia Walters is a 83 y.o. female with medical history significant for HTN, HLD, COPD, HFpEF( EF 55-60% 11/16/2023), alcohol use disorder, chronic back pain, neuropathy, recently admitted from 4/1 to 4/4 with symptomatic anemia requiring 3 units PRBCs, complicated by acute encephalopathy attributed to delirium versus alcohol abuse versus baseline dementia, referred for outpatient GI workup and IV iron  with hematology, with good being admitted with a syncopal episode that occurred during the course of a colonoscopy prep. Tomorrow.  Patient was found sitting outside confused.  Per triage note, there was evidence of a trail of fecal incontinence from where she was sitting to inside the house and within the house.  Patient is currently oriented to person and place and is not sure about the circumstances but states she must have passed out. ED course and data review:Vitals within normal limits. Labs notable for AKI with creatinine 3.28 up from 1.03 a couple weeks prior. Hemoglobin 10.5 up from 9.3 a couple weeks aago. K+ 2.8 WBC and lactic acid normal Etoh <10 EKG with NSR at 67 CT head non acute "   Patient was admitted for further evaluation and management of acute metabolic encephalopathy and question of possible syncopal episode and AKI.  GI was consulted on admission, they do not plan for endoscopy before Monday, if acute issues are improved.    Further hospital course and management as outlined below.   4/19 -- Not UA appears grossly positive for UTI.  Started empiric IV Rocephin  and added on a urine culture.  Pt more alert but remains very confused, oriented only to self. 4/20 -- mental status improved somewhat, baseline unclear.  Cr improving.  Urine culture +E coli, remains on  empiric Rocephin .  No plan for endoscopy tomorrow per GI, needs further clinical improvement before bowel prep for procedure. 4/21 -- changed to PO Keflex .   4/22 -- GI started bowel prep, endoscopies tomorrow     Assessment and Plan:   UTI - POA.  Not septic. 4/20 -- urine culture grew pan-sensitive E.coli Has completed 5 days course of antibiotic therapy   Possible Syncope - in setting of pt doing bowel prep Acute metabolic encephalopathy - due to UTI, as well as possible alcohol use CT head non-acute. No focal neurologic deficits. History from [name], patient's cousin - no history of dementia - pt lives independently, drives & manages ADL's etc without assistance.  --Empiric high dose IV thiamine  500 mg IV TID for 5 days course Continue to follow-up on thiamine  levels --Consider MRI brain Monitor CBC closely --Neurologic checks with fall and aspiration precautions Continue to monitor on delirium precaution   AKI (acute kidney injury)  Baseline Cr 1.0-1.1. Admitted with Cr 3.28 with pre-renal azotemia Monitor renal function closely   Dementia,  Continue delirium precaution   Symptomatic anemia Presumed GI.  Had recent transfusion of 3 units PRBCs and Venofer  --GI consulted, plan for EGD and colonoscopy tomorrow 4/23 --Clear liquid diet per GI -- diet orders per GI Monitor hemoglobin closely and transfuse as needed to maintain hemoglobin greater than 8   COPD (chronic obstructive pulmonary disease) (HCC) Not acutely exacerbated Continue as needed nebulization   Chronic systolic heart failure (HCC) EF 55 to 60% with G1 DD.  History of  systolic heart failure Continue lisinopril  as renal function is improved   Alcohol use disorder EtOH level undetectable on admission Pt's cousin reports history of drinking wine 4-5+ glasses per day when she is drinking, but looked around pt's home and garbage cans and did not see evidence of recent drinking. --Cancelled CIWA protocol    HTN (hypertension) Continue lisinopril  40 mg daily Continue as needed hydralazine        Subjective:  Patient seen and examined at bedside this morning Underwent capsule endoscopy today however this was unsuccessful according to GI as the capsule had not advance and was still sitting in the stomach.  According to gastroenterologist this will be planned as an outpatient   Physical Exam: General exam: awake, alert, no acute distress, obese, pleasantly confused HEENT: moist mucus membranes, hearing grossly normal  Respiratory system: expiratory wheezes, normal respiratory effort on room air. Cardiovascular system: RRR,no peripheral edema, normal S1/S2 Gastrointestinal system: soft, NT, ND Central nervous system: A&O x self and hospital, not year or situation. no gross focal neurologic deficits, normal speech Extremities: moves all, no edema, normal tone Psychiatry: normal mood, congruent affect, confused, abnormal judgment and insight    Family Communication: Patient's cousin Virginia Walters was updated in detail by phone this afternoon.     Disposition: Status is: Inpatient Remains inpatient appropriate because: --SNF placement recommended.      Planned Discharge Destination: PT has recommended skilled nursing facility placement    Time spent: 38 minutes    Data Reviewed:    Latest Ref Rng & Units 12/07/2023    3:50 AM 12/04/2023    4:19 AM 12/03/2023    4:25 AM  CBC  WBC 4.0 - 10.5 K/uL 6.2  5.0  5.2   Hemoglobin 12.0 - 15.0 g/dL 9.7  9.2  9.0   Hematocrit 36.0 - 46.0 % 31.8  29.1  28.3   Platelets 150 - 400 K/uL 222  231  208     Vitals:   12/07/23 0017 12/07/23 0355 12/07/23 0810 12/07/23 1445  BP: (!) 154/63 (!) 167/75 (!) 167/65 (!) 148/64  Pulse: 98 76 80 86  Resp:  20 18 (!) 24  Temp:  98.6 F (37 C) 98.1 F (36.7 C) 98.2 F (36.8 C)  TempSrc:   Oral Oral  SpO2:  95% 96% 94%  Weight:      Height:          Latest Ref Rng & Units 12/07/2023    3:50 AM 12/06/2023     4:19 AM 12/05/2023    4:28 AM  BMP  Glucose 70 - 99 mg/dL 78  86  89   BUN 8 - 23 mg/dL 18  18  22    Creatinine 0.44 - 1.00 mg/dL 4.78  2.95  6.21   Sodium 135 - 145 mmol/L 144  140  141   Potassium 3.5 - 5.1 mmol/L 4.2  3.4  3.5   Chloride 98 - 111 mmol/L 112  110  112   CO2 22 - 32 mmol/L 23  23  22    Calcium  8.9 - 10.3 mg/dL 8.8  8.7  8.9       Author: Ezzard Holms, MD 12/07/2023 3:05 PM  For on call review [email or web address].

## 2023-12-07 NOTE — Progress Notes (Signed)
 Pt tele-monitoring was transported with the patient in room 210. The equipment for the capsule study was removed per order. Endoscopy will come pick it up in the morning.  Receiving RN in room for 210 made aware

## 2023-12-07 NOTE — Plan of Care (Signed)

## 2023-12-08 ENCOUNTER — Encounter
Admission: EM | Disposition: A | Discharge status: Skilled Nursing Facility | Payer: Self-pay | Source: Home / Self Care | Attending: Internal Medicine

## 2023-12-08 ENCOUNTER — Inpatient Hospital Stay

## 2023-12-08 DIAGNOSIS — N179 Acute kidney failure, unspecified: Secondary | ICD-10-CM | POA: Diagnosis not present

## 2023-12-08 DIAGNOSIS — R55 Syncope and collapse: Secondary | ICD-10-CM | POA: Diagnosis not present

## 2023-12-08 DIAGNOSIS — R4182 Altered mental status, unspecified: Secondary | ICD-10-CM | POA: Diagnosis not present

## 2023-12-08 DIAGNOSIS — I2699 Other pulmonary embolism without acute cor pulmonale: Secondary | ICD-10-CM

## 2023-12-08 LAB — CBC WITH DIFFERENTIAL/PLATELET
Abs Immature Granulocytes: 0.02 10*3/uL (ref 0.00–0.07)
Basophils Absolute: 0.1 10*3/uL (ref 0.0–0.1)
Basophils Relative: 1 %
Eosinophils Absolute: 0.8 10*3/uL — ABNORMAL HIGH (ref 0.0–0.5)
Eosinophils Relative: 11 %
HCT: 30.2 % — ABNORMAL LOW (ref 36.0–46.0)
Hemoglobin: 9.2 g/dL — ABNORMAL LOW (ref 12.0–15.0)
Immature Granulocytes: 0 %
Lymphocytes Relative: 16 %
Lymphs Abs: 1.2 10*3/uL (ref 0.7–4.0)
MCH: 24.5 pg — ABNORMAL LOW (ref 26.0–34.0)
MCHC: 30.5 g/dL (ref 30.0–36.0)
MCV: 80.3 fL (ref 80.0–100.0)
Monocytes Absolute: 0.9 10*3/uL (ref 0.1–1.0)
Monocytes Relative: 11 %
Neutro Abs: 4.5 10*3/uL (ref 1.7–7.7)
Neutrophils Relative %: 61 %
Platelets: 206 10*3/uL (ref 150–400)
RBC: 3.76 MIL/uL — ABNORMAL LOW (ref 3.87–5.11)
RDW: 21.2 % — ABNORMAL HIGH (ref 11.5–15.5)
Smear Review: NORMAL
WBC: 7.5 10*3/uL (ref 4.0–10.5)
nRBC: 0 % (ref 0.0–0.2)

## 2023-12-08 LAB — COMPREHENSIVE METABOLIC PANEL WITH GFR
ALT: 18 U/L (ref 0–44)
AST: 18 U/L (ref 15–41)
Albumin: 2.9 g/dL — ABNORMAL LOW (ref 3.5–5.0)
Alkaline Phosphatase: 50 U/L (ref 38–126)
Anion gap: 7 (ref 5–15)
BUN: 22 mg/dL (ref 8–23)
CO2: 22 mmol/L (ref 22–32)
Calcium: 8.5 mg/dL — ABNORMAL LOW (ref 8.9–10.3)
Chloride: 111 mmol/L (ref 98–111)
Creatinine, Ser: 1.43 mg/dL — ABNORMAL HIGH (ref 0.44–1.00)
GFR, Estimated: 37 mL/min — ABNORMAL LOW (ref >60)
Glucose, Bld: 117 mg/dL — ABNORMAL HIGH (ref 70–99)
Potassium: 3.7 mmol/L (ref 3.5–5.1)
Sodium: 140 mmol/L (ref 135–145)
Total Bilirubin: 0.6 mg/dL (ref 0.0–1.2)
Total Protein: 5.7 g/dL — ABNORMAL LOW (ref 6.5–8.1)

## 2023-12-08 LAB — HEPARIN LEVEL (UNFRACTIONATED): Heparin Unfractionated: 0.83 [IU]/mL — ABNORMAL HIGH (ref 0.30–0.70)

## 2023-12-08 LAB — BLOOD GAS, VENOUS
Acid-Base Excess: 1 mmol/L (ref 0.0–2.0)
Bicarbonate: 26 mmol/L (ref 20.0–28.0)
O2 Content: 2 L/min
O2 Saturation: 96 %
Patient temperature: 37
pCO2, Ven: 42 mmHg — ABNORMAL LOW (ref 44–60)
pH, Ven: 7.4 (ref 7.25–7.43)
pO2, Ven: 70 mmHg — ABNORMAL HIGH (ref 32–45)

## 2023-12-08 LAB — BRAIN NATRIURETIC PEPTIDE: B Natriuretic Peptide: 55 pg/mL (ref 0.0–100.0)

## 2023-12-08 LAB — GLUCOSE, CAPILLARY: Glucose-Capillary: 115 mg/dL — ABNORMAL HIGH (ref 70–99)

## 2023-12-08 LAB — MAGNESIUM: Magnesium: 1.7 mg/dL (ref 1.7–2.4)

## 2023-12-08 LAB — PROCALCITONIN: Procalcitonin: <0.1 ng/mL

## 2023-12-08 LAB — D-DIMER, QUANTITATIVE: D-Dimer, Quant: 3.07 ug{FEU}/mL — ABNORMAL HIGH (ref 0.00–0.50)

## 2023-12-08 SURGERY — PULMONARY THROMBECTOMY
Anesthesia: Moderate Sedation | Laterality: Bilateral

## 2023-12-08 MED ORDER — HEPARIN (PORCINE) 25000 UT/250ML-% IV SOLN
1200.0000 [IU]/h | INTRAVENOUS | Status: DC
  Administered 2023-12-08: 1200 [IU]/h via INTRAVENOUS
  Administered 2023-12-08: 1350 [IU]/h via INTRAVENOUS
  Administered 2023-12-10 (×2): 1200 [IU]/h via INTRAVENOUS
  Filled 2023-12-08 (×4): qty 250

## 2023-12-08 MED ORDER — METHYLPREDNISOLONE SODIUM SUCC 125 MG IJ SOLR
125.0000 mg | Freq: Once | INTRAMUSCULAR | Status: DC | PRN
Start: 2023-12-08 — End: 2023-12-11

## 2023-12-08 MED ORDER — CEFAZOLIN SODIUM-DEXTROSE 2-4 GM/100ML-% IV SOLN
2.0000 g | INTRAVENOUS | Status: AC
  Administered 2023-12-11: 2 g via INTRAVENOUS
  Filled 2023-12-08: qty 100

## 2023-12-08 MED ORDER — MIDAZOLAM HCL 2 MG/ML PO SYRP
8.0000 mg | ORAL_SOLUTION | Freq: Once | ORAL | Status: DC | PRN
  Filled 2023-12-08: qty 5

## 2023-12-08 MED ORDER — FAMOTIDINE 20 MG PO TABS
40.0000 mg | ORAL_TABLET | Freq: Once | ORAL | Status: DC | PRN
Start: 2023-12-08 — End: 2023-12-11

## 2023-12-08 MED ORDER — BUDESONIDE 0.25 MG/2ML IN SUSP
0.2500 mg | Freq: Two times a day (BID) | RESPIRATORY_TRACT | Status: DC
Start: 2023-12-08 — End: 2023-12-12
  Administered 2023-12-08 – 2023-12-12 (×8): 0.25 mg via RESPIRATORY_TRACT
  Filled 2023-12-08 (×8): qty 2

## 2023-12-08 MED ORDER — HEPARIN BOLUS VIA INFUSION
5000.0000 [IU] | Freq: Once | INTRAVENOUS | Status: AC
  Administered 2023-12-08: 5000 [IU] via INTRAVENOUS
  Filled 2023-12-08: qty 5000

## 2023-12-08 MED ORDER — FENTANYL CITRATE PF 50 MCG/ML IJ SOSY: 12.5000 ug | PREFILLED_SYRINGE | Freq: Once | INTRAMUSCULAR | Status: DC | PRN

## 2023-12-08 MED ORDER — SODIUM CHLORIDE 0.9 % IV SOLN: INTRAVENOUS | Status: DC

## 2023-12-08 MED ORDER — DIPHENHYDRAMINE HCL 50 MG/ML IJ SOLN: 50.0000 mg | Freq: Once | INTRAMUSCULAR | Status: DC | PRN

## 2023-12-08 MED ORDER — IOHEXOL 350 MG/ML SOLN
75.0000 mL | Freq: Once | INTRAVENOUS | Status: AC | PRN
  Administered 2023-12-08: 75 mL via INTRAVENOUS

## 2023-12-08 MED ORDER — METHYLPREDNISOLONE SODIUM SUCC 125 MG IJ SOLR
125.0000 mg | INTRAMUSCULAR | Status: AC
  Administered 2023-12-08: 125 mg via INTRAVENOUS
  Filled 2023-12-08: qty 2

## 2023-12-08 MED ORDER — BUDESONIDE 0.25 MG/2ML IN SUSP
0.2500 mg | Freq: Once | RESPIRATORY_TRACT | Status: AC
  Administered 2023-12-08: 0.25 mg via RESPIRATORY_TRACT
  Filled 2023-12-08: qty 2

## 2023-12-08 NOTE — NC FL2 (Signed)
 [place]  MEDICAID FL2 LEVEL OF CARE FORM     IDENTIFICATION  Patient Name: Virginia Walters Birthdate: 1941-02-08 Sex: female Admission Date (Current Location): 12/01/2023  [facility] and [place] Number:  Chiropodist and Address:  [facility], [address], [place], [place] [address]      Provider Number: [id number]  Attending Physician Name and Address:  Ezzard Holms, MD  Relative Name and Phone Number:       Current Level of Care: Hospital Recommended Level of Care: Skilled Nursing Facility Prior Approval Number:    Date Approved/Denied:   PASRR Number: pending  Discharge Plan: SNF    Current Diagnoses: Patient Active Problem List   Diagnosis Date Noted   Acute pulmonary embolism without acute cor pulmonale (HCC) 12/08/2023   Altered mental status 12/08/2023   Acute metabolic encephalopathy 12/01/2023   Syncope 12/01/2023   Chronic back pain 11/14/2023   Neurodermatitis 12/06/2022   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   Pressure injury of skin 10/08/2021   AKI (acute kidney injury) (HCC) 10/08/2021   Acute renal failure superimposed on stage 3a chronic kidney disease (HCC) 10/07/2021   COPD (chronic obstructive pulmonary disease) (HCC) 10/07/2021   Acute on chronic respiratory failure with hypoxia (HCC) 09/21/2021   Insomnia 09/18/2021   Acute cholecystitis s/p perc cholecystotomy drain 09/17/2021 09/16/2021   Iron  deficiency anemia    Gastric polyp    Stomach irritation    Ectopic gastric mucosa    Polyp of colon    Angiodysplasia of intestinal tract    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, stage 3a (HCC) 02/10/2020   COPD exacerbation (HCC) 10/08/2018   Chronic systolic heart failure (HCC) 08/29/2018   Acute on chronic heart failure with preserved ejection fraction (HCC)    Symptomatic anemia 07/20/2018   Dementia with behavioral problem 10/26/2016    Alcohol use disorder 10/26/2016   Memory loss 10/12/2016   Gait abnormality 10/12/2016   Orthostatic dizziness 10/12/2016   Paresthesia 10/12/2016   Abnormal EKG 12/28/2010   HTN (hypertension)    Dyslipidemia    PFO (patent foramen ovale)    Retinal artery occlusion    GERD (gastroesophageal reflux disease)     Orientation RESPIRATION BLADDER Height & Weight     Place, Self  O2 (3L) Incontinent Weight: 101.8 kg Height:  5\' 7"  (170.2 cm)  BEHAVIORAL SYMPTOMS/MOOD NEUROLOGICAL BOWEL NUTRITION STATUS      Incontinent Diet (regular)  AMBULATORY STATUS COMMUNICATION OF NEEDS Skin   Extensive Assist Verbally Bruising                       Personal Care Assistance Level of Assistance              Functional Limitations Info  Sight Sight Info: Impaired        SPECIAL CARE FACTORS FREQUENCY  PT (By licensed PT), OT (By licensed OT)                    Contractures Contractures Info: Not present    Additional Factors Info  Code Status, Allergies Code Status Info: full Allergies Info: Ibuprofen , Oxycodone            Current Medications (12/08/2023):  This is the current hospital active medication list Current Facility-Administered Medications  Medication Dose Route Frequency Provider Last Rate Last Admin   0.9 %  sodium chloride  infusion   Intravenous  Continuous Pace, Brien R, NP 75 mL/hr at 12/08/23 1608 New Bag at 12/08/23 1608   acetaminophen  (TYLENOL ) tablet 650 mg  650 mg Oral Q6H PRN Marnee Sink, MD       Or   acetaminophen  (TYLENOL ) suppository 650 mg  650 mg Rectal Q6H PRN Marnee Sink, MD       amLODipine  (NORVASC ) tablet 5 mg  5 mg Oral Daily Marnee Sink, MD   5 mg at 12/08/23 0940   budesonide  (PULMICORT ) nebulizer solution 0.25 mg  0.25 mg Nebulization BID Elisabeth Guild, NP   0.25 mg at 12/08/23 1610   ceFAZolin  (ANCEF ) IVPB 2g/100 mL premix  2 g Intravenous 30 min Pre-Op Pace, Brien R, NP       diphenhydrAMINE  (BENADRYL ) injection 50 mg   50 mg Intravenous Once PRN Pace, Brien R, NP       doxazosin  (CARDURA ) tablet 2 mg  2 mg Oral Daily Wohl, Darren, MD   2 mg at 12/08/23 0940   famotidine  (PEPCID ) tablet 40 mg  40 mg Oral Once PRN Pace, Brien R, NP       fentaNYL  (SUBLIMAZE ) injection 12.5 mcg  12.5 mcg Intravenous Once PRN Pace, Brien R, NP       heparin  ADULT infusion 100 units/mL (25000 units/250mL)  1,350 Units/hr Intravenous Continuous Alvenia Aus T, MD 13.5 mL/hr at 12/08/23 1605 1,350 Units/hr at 12/08/23 1605   hydrALAZINE  (APRESOLINE ) injection 10 mg  10 mg Intravenous Q4H PRN Marnee Sink, MD   10 mg at 12/05/23 2136   ipratropium-albuterol  (DUONEB) 0.5-2.5 (3) MG/3ML nebulizer solution 3 mL  3 mL Nebulization Q6H PRN Marnee Sink, MD   3 mL at 12/08/23 0211   lisinopril  (ZESTRIL ) tablet 40 mg  40 mg Oral Daily Marnee Sink, MD   40 mg at 12/08/23 0940   methylPREDNISolone  sodium succinate (SOLU-MEDROL ) 125 mg/2 mL injection 125 mg  125 mg Intravenous Once PRN Pace, Brien R, NP       midazolam  (VERSED ) 2 MG/ML syrup 8 mg  8 mg Oral Once PRN Pace, Brien R, NP       ondansetron  (ZOFRAN ) tablet 4 mg  4 mg Oral Q6H PRN Marnee Sink, MD       Or   ondansetron  (ZOFRAN ) injection 4 mg  4 mg Intravenous Q6H PRN Marnee Sink, MD       Oral care mouth rinse  15 mL Mouth Rinse PRN Marnee Sink, MD       oxyCODONE  (Oxy IR/ROXICODONE ) immediate release tablet 5 mg  5 mg Oral Q4H PRN Marnee Sink, MD       pantoprazole  (PROTONIX ) EC tablet 40 mg  40 mg Oral Daily Wohl, Darren, MD   40 mg at 12/08/23 0940   sodium chloride  flush (NS) 0.9 % injection 3 mL  3 mL Intravenous Q12H Marnee Sink, MD   3 mL at 12/08/23 0945   thiamine  (VITAMIN B1) 500 mg in sodium chloride  0.9 % 50 mL IVPB  500 mg Intravenous TID Merrill, Kristin A, RPH 110 mL/hr at 12/08/23 1608 500 mg at 12/08/23 1608     Discharge Medications: Please see discharge summary for a list of discharge medications.  Relevant Imaging Results:  Relevant Lab  Results:   Additional Information SS# [id number]  Loman Risk, RN

## 2023-12-08 NOTE — TOC Initial Note (Signed)
 Transition of Care [facility]) - Initial/Assessment Note    Patient Details  Name: Virginia Walters MRN: [id number] Date of Birth: 10/04/1940  [facility] CM/SW Contact:    Loman Risk, RN Phone Number: 12/08/2023, 4:22 PM  Clinical Narrative:                  Spoke with daughter [name] in regards to discharge planning Current recs are for SNF.  Daughter in agreement PASRR pending - will need to upload clinical to [facility] after MD signs Fl2, 30 day note, dementia note sent for signature Bed search initiated        Patient Goals and CMS Choice            Expected Discharge Plan and Services                                              Prior Living Arrangements/Services                       Activities of Daily Living   ADL Screening (condition at time of admission) Independently performs ADLs?: No Does the patient have a NEW difficulty with bathing/dressing/toileting/self-feeding that is expected to last >3 days?: Yes (Initiates electronic notice to provider for possible OT consult) Does the patient have a NEW difficulty with getting in/out of bed, walking, or climbing stairs that is expected to last >3 days?: Yes (Initiates electronic notice to provider for possible PT consult) Does the patient have a NEW difficulty with communication that is expected to last >3 days?: No Is the patient deaf or have difficulty hearing?: No Does the patient have difficulty seeing, even when wearing glasses/contacts?: Yes Does the patient have difficulty concentrating, remembering, or making decisions?: Yes  Permission Sought/Granted                  Emotional Assessment              Admission diagnosis:  Dehydration [E86.0] Syncope [R55] Altered mental status, unspecified altered mental status type [R41.82] Acute metabolic encephalopathy [G93.41] Patient Active Problem List   Diagnosis Date Noted   Acute pulmonary embolism without  acute cor pulmonale (HCC) 12/08/2023   Altered mental status 12/08/2023   Acute metabolic encephalopathy 12/01/2023   Syncope 12/01/2023   Chronic back pain 11/14/2023   Neurodermatitis 12/06/2022   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   Pressure injury of skin 10/08/2021   AKI (acute kidney injury) (HCC) 10/08/2021   Acute renal failure superimposed on stage 3a chronic kidney disease (HCC) 10/07/2021   COPD (chronic obstructive pulmonary disease) (HCC) 10/07/2021   Acute on chronic respiratory failure with hypoxia (HCC) 09/21/2021   Insomnia 09/18/2021   Acute cholecystitis s/p perc cholecystotomy drain 09/17/2021 09/16/2021   Iron  deficiency anemia    Gastric polyp    Stomach irritation    Ectopic gastric mucosa    Polyp of colon    Angiodysplasia of intestinal tract    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, stage 3a (HCC) 02/10/2020   COPD exacerbation (HCC) 10/08/2018   Chronic systolic heart failure (HCC) 08/29/2018   Acute on chronic heart failure with preserved ejection fraction (HCC)    Symptomatic anemia 07/20/2018   Dementia with behavioral problem 10/26/2016   Alcohol use disorder 10/26/2016  Memory loss 10/12/2016   Gait abnormality 10/12/2016   Orthostatic dizziness 10/12/2016   Paresthesia 10/12/2016   Abnormal EKG 12/28/2010   HTN (hypertension)    Dyslipidemia    PFO (patent foramen ovale)    Retinal artery occlusion    GERD (gastroesophageal reflux disease)    PCP:  Austine Lefort, MD Pharmacy:   [facility]) - [place], [place] - [address] [facility] [address] [place] [address] [place] [address] Phone: [phone] Fax: [phone]  [facility] - [place], [place] - [address] [place] [address] Phone: [phone] Fax: [phone]  [facility] - [place], [place] - [address]  [place] [address] Phone: [phone] Fax: [phone]     Social Drivers of Health (SDOH) Social History: SDOH Screenings   Food Insecurity: No Food Insecurity (12/02/2023)  Housing: Low Risk  (12/02/2023)  Transportation Needs: No Transportation Needs (12/02/2023)  Utilities: Not At Risk (12/02/2023)  Alcohol Screen: Low Risk  (05/25/2022)  Depression (PHQ2-9): Low Risk  (11/13/2023)  Financial Resource Strain: Low Risk  (05/25/2022)  Physical Activity: Inactive (05/25/2022)  Social Connections: Moderately Integrated (12/02/2023)  Stress: No Stress Concern Present (05/25/2022)  Tobacco Use: Medium Risk (12/06/2023)   SDOH Interventions:     Readmission Risk Interventions    09/20/2021   12:40 PM  Readmission Risk Prevention Plan  Transportation Screening Complete  PCP or Specialist Appt within 5-7 Days Complete  Home Care Screening Complete  Medication Review (RN CM) Complete

## 2023-12-08 NOTE — Progress Notes (Signed)
 The patient's capsule endoscopy remains in the stomach during the entire procedure likely due to her immobility.  The patient's hemoglobin has fluctuated but remains stable.  It is recommended that the patient follow-up as an outpatient for a capsule endoscopy and avoid further alcohol use.  I will sign off.  Please call if any further GI concerns or questions.  We would like to thank you for the opportunity to participate in the care of Virginia Walters.

## 2023-12-08 NOTE — Progress Notes (Signed)
 PHARMACY - ANTICOAGULATION CONSULT NOTE  Pharmacy Consult for Heparin   Indication: pulmonary embolus  Allergies  Allergen Reactions   Ibuprofen     Oxycodone  Itching    Patient Measurements: Height: 5\' 7"  (170.2 cm) Weight: 101.8 kg (224 lb 6.9 oz) IBW/kg (Calculated) : 61.6 HEPARIN  DW (KG): 84.8  Vital Signs: Temp: 98 F (36.7 C) (04/25 1605) Temp Source: Oral (04/25 0750) BP: 152/67 (04/25 1605) Pulse Rate: 86 (04/25 1605)  Labs: Recent Labs    12/06/23 0419 12/07/23 0350 12/08/23 0411 12/08/23 1546  HGB  --  9.7* 9.2*  --   HCT  --  31.8* 30.2*  --   PLT  --  222 206  --   HEPARINUNFRC  --   --   --  0.83*  CREATININE 1.26* 1.43* 1.43*  --     Estimated Creatinine Clearance: 37.2 mL/min (A) (by C-G formula based on SCr of 1.43 mg/dL (H)).   Medical History: Past Medical History:  Diagnosis Date   (HFpEF) heart failure with preserved ejection fraction (HCC)    a.) TTE 07/22/2018: EF 45-50%; mild LVH; mild diffuse HK; mild LA dilation; PASP 30-35 mmHg; G1DD.   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   Past Week   dorzolamide  (TRUSOPT ) 2 % ophthalmic solution Place 1 drop into the left eye 2 (two) times daily.   Past Week   doxazosin  (CARDURA ) 4 MG tablet TAKE ONE-HALF TABLET BY MOUTH  DAILY 50 tablet 2 Past Week   fish oil-omega-3 fatty acids  1000 MG capsule Take 1,000 mg by mouth daily.   Past Week   lisinopril  (ZESTRIL ) 40 MG tablet TAKE 1 TABLET BY MOUTH DAILY 100 tablet 2 Past Week   Multiple Vitamin (MULTIVITAMIN) tablet Take 1 tablet by mouth daily.   Past Week   pantoprazole  (PROTONIX ) 40 MG tablet Take 1 tablet (40 mg total) by mouth daily. 30 tablet 0 Past Week   polyethylene glycol (GOLYTELY ) 236 g solution 5pm the evening before, mix very well and drink 8 ounces every 20 minutes until (1/2) of the liquid has been completed On the day of procedure, 5 hours before, drink 8 ounces every 15 minutes until complete 4000 mL 0 12/01/2023   pravastatin  (PRAVACHOL )  40 MG tablet Take 1 tablet (40 mg total) by mouth daily. 09/29/23-PT NEEDS APPOINTMENT 30 tablet 0 Past Month   solifenacin  (VESICARE ) 10 MG tablet Take 1 tablet (10 mg total) by mouth daily. 90 tablet 3 Past Week   traZODone  (DESYREL ) 50 MG tablet TAKE 1 TABLET BY MOUTH AT  BEDTIME 90 tablet 3 Past Week   BREZTRI  AEROSPHERE 160-9-4.8 MCG/ACT AERO USE 2 INHALATIONS BY MOUTH INTO  THE LUNGS IN THE MORNING AND AT  BEDTIME (Patient not taking: Reported on 11/14/2023) 32.1 g 3 Not Taking   feeding supplement (ENSURE SURGERY) LIQD Take 237 mLs by mouth 2 (two) times daily between meals.       Assessment: Pharmacy consulted to dose heparin  in this 83 year old female admitted with PE.  No prior anticoag noted. CrCl = 37.2 ml/min   04/25 1546 HL 0.83   Goal of Therapy:  Heparin  level 0.3-0.7  units/ml Monitor platelets by anticoagulation protocol: Yes   Plan:  Heparin  level is supratherapeutic Decrease heparin  infusion to 1200 units/hr Recheck HL 8 hours after rate change CBC daily while on heparin   Ramonita Burow 12/08/2023,4:12 PM

## 2023-12-08 NOTE — Progress Notes (Signed)
 Progress Note   [name] ZOX:[id number] DOB: April 03, 1941 DOA: 12/01/2023     6 DOS: the patient was seen and examined on 12/08/2023      Brief hospital course: HPI on admission by Dr. Vallarie Gauze:  " Virginia Walters is a 83 y.o. female with medical history significant for HTN, HLD, COPD, HFpEF( EF 55-60% 11/16/2023), alcohol use disorder, chronic back pain, neuropathy, recently admitted from 4/1 to 4/4 with symptomatic anemia requiring 3 units PRBCs, complicated by acute encephalopathy attributed to delirium versus alcohol abuse versus baseline dementia, referred for outpatient GI workup and IV iron  with hematology, with good being admitted with a syncopal episode that occurred during the course of a colonoscopy prep. Tomorrow.  Patient was found sitting outside confused.  Per triage note, there was evidence of a trail of fecal incontinence from where she was sitting to inside the house and within the house.  Patient is currently oriented to person and place and is not sure about the circumstances but states she must have passed out. ED course and data review:Vitals within normal limits. Labs notable for AKI with creatinine 3.28 up from 1.03 a couple weeks prior. Hemoglobin 10.5 up from 9.3 a couple weeks aago. K+ 2.8 WBC and lactic acid normal Etoh <10 EKG with NSR at 67 CT head non acute "   Patient was admitted for further evaluation and management of acute metabolic encephalopathy and question of possible syncopal episode and AKI.  GI was consulted on admission, they do not plan for endoscopy before Monday, if acute issues are improved.    Further hospital course and management as outlined below.   4/19 -- Not UA appears grossly positive for UTI.  Started empiric IV Rocephin  and added on a urine culture.  Pt more alert but remains very confused, oriented only to self. 4/20 -- mental status improved somewhat, baseline unclear.  Cr improving.  Urine culture +E coli, remains on  empiric Rocephin .  No plan for endoscopy tomorrow per GI, needs further clinical improvement before bowel prep for procedure. 4/21 -- changed to PO Keflex .   4/22 -- GI started bowel prep, endoscopies tomorrow     Assessment and Plan:   Acute hypoxic respiratory failure secondary to multiple pulmonary embolism This morning patient developed worsening respiratory function and CT scan of the chest obtained showed extensive pulmonary embolism.  I have discussed with vascular surgery team and they plan to see patient for possible thrombectomy. Continue heparin  drip for now   UTI - POA.  Not septic. 4/20 -- urine culture grew pan-sensitive E.coli Has completed 5 days course of antibiotic therapy   Possible Syncope - in setting of pt doing bowel prep Acute metabolic encephalopathy - due to UTI, as well as possible alcohol use CT head non-acute. No focal neurologic deficits. History from [name], patient's cousin - no history of dementia - pt lives independently, drives & manages ADL's etc without assistance.  --Empiric high dose IV thiamine  500 mg IV TID for 5 days course Continue to follow-up on thiamine  levels --Consider MRI brain Monitor CBC closely --Neurologic checks with fall and aspiration precautions Continue to monitor on delirium precaution   AKI (acute kidney injury)  Baseline Cr 1.0-1.1. Admitted with Cr 3.28 with pre-renal azotemia Monitor renal function closely   Dementia,  Continue delirium precaution   Symptomatic anemia Presumed GI.  Had recent transfusion of 3 units PRBCs and Venofer  --GI consulted, plan for EGD and colonoscopy tomorrow 4/23 --Clear liquid diet per  GI -- diet orders per GI Monitor hemoglobin closely and transfuse as needed to maintain hemoglobin greater than 8   COPD (chronic obstructive pulmonary disease) (HCC) Not acutely exacerbated Continue as needed nebulization   Chronic systolic heart failure (HCC) EF 55 to 60% with G1 DD.  History of  systolic heart failure Continue lisinopril  as renal function is improved   Alcohol use disorder EtOH level undetectable on admission Pt's cousin reports history of drinking wine 4-5+ glasses per day when she is drinking, but looked around pt's home and garbage cans and did not see evidence of recent drinking. --Cancelled CIWA protocol   HTN (hypertension) Continue lisinopril  40 mg daily Continue as needed hydralazine        Subjective:  Patient seen and examined at bedside this morning This morning patient developed worsening respiratory function and CT scan of the chest obtained showed extensive pulmonary embolism.  I have discussed with vascular surgery team and they plan to see patient for possible thrombectomy.   Physical Exam: General exam: awake, alert, no acute distress, obese, pleasantly confused HEENT: moist mucus membranes, hearing grossly normal  Respiratory system: expiratory wheezes, normal respiratory effort on room air. Cardiovascular system: RRR,no peripheral edema, normal S1/S2 Gastrointestinal system: soft, NT, ND Central nervous system: A&O x self and hospital, not year or situation. no gross focal neurologic deficits, normal speech Extremities: moves all, no edema, normal tone Psychiatry: normal mood, congruent affect, confused, abnormal judgment and insight     Family Communication: [name] was updated in detail by phone this afternoon.     Disposition: Status is: Inpatient Remains inpatient appropriate because: --SNF placement recommended.      Planned Discharge Destination: PT has recommended skilled nursing facility placement     Time spent: 40 minutes    Data Reviewed:  Vitals:   12/08/23 0229 12/08/23 0359 12/08/23 0420 12/08/23 0750  BP: (!) 158/73 (!) 146/68  (!) 151/114  Pulse: 89 86  89  Resp: (!) 36 (!) 34  (!) 21  Temp:  97.7 F (36.5 C)  98.8 F (37.1 C)  TempSrc:    Oral  SpO2: 98% 96%  95%  Weight:   101.8 kg    Height:          Latest Ref Rng & Units 12/08/2023    4:11 AM 12/07/2023    3:50 AM 12/04/2023    4:19 AM  CBC  WBC 4.0 - 10.5 K/uL 7.5  6.2  5.0   Hemoglobin 12.0 - 15.0 g/dL 9.2  9.7  9.2   Hematocrit 36.0 - 46.0 % 30.2  31.8  29.1   Platelets 150 - 400 K/uL 206  222  231        Latest Ref Rng & Units 12/08/2023    4:11 AM 12/07/2023    3:50 AM 12/06/2023    4:19 AM  BMP  Glucose 70 - 99 mg/dL 086  78  86   BUN 8 - 23 mg/dL 22  18  18    Creatinine 0.44 - 1.00 mg/dL 5.78  4.69  6.29   Sodium 135 - 145 mmol/L 140  144  140   Potassium 3.5 - 5.1 mmol/L 3.7  4.2  3.4   Chloride 98 - 111 mmol/L 111  112  110   CO2 22 - 32 mmol/L 22  23  23    Calcium  8.9 - 10.3 mg/dL 8.5  8.8  8.7      Author: Ezzard Holms, MD 12/08/2023 2:35  PM  For on call review [email or web address].

## 2023-12-08 NOTE — Progress Notes (Signed)
 PHARMACY - ANTICOAGULATION CONSULT NOTE  Pharmacy Consult for Heparin   Indication: pulmonary embolus  Allergies  Allergen Reactions   Ibuprofen     Oxycodone  Itching    Patient Measurements: Height: 5\' 7"  (170.2 cm) Weight: 101.8 kg (224 lb 6.9 oz) IBW/kg (Calculated) : 61.6 HEPARIN  DW (KG): 84.8  Vital Signs: Temp: 97.7 F (36.5 C) (04/25 0359) BP: 146/68 (04/25 0359) Pulse Rate: 86 (04/25 0359)  Labs: Recent Labs    12/06/23 0419 12/07/23 0350 12/08/23 0411  HGB  --  9.7* 9.2*  HCT  --  31.8* 30.2*  PLT  --  222 206  CREATININE 1.26* 1.43* 1.43*    Estimated Creatinine Clearance: 37.2 mL/min (A) (by C-G formula based on SCr of 1.43 mg/dL (H)).   Medical History: Past Medical History:  Diagnosis Date   (HFpEF) heart failure with preserved ejection fraction (HCC)    a.) TTE 07/22/2018: EF 45-50%; mild LVH; mild diffuse HK; mild LA dilation; PASP 30-35 mmHg; G1DD.   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   Past Week   dorzolamide  (TRUSOPT ) 2 % ophthalmic solution Place 1 drop into the left eye 2 (two) times daily.   Past Week   doxazosin  (CARDURA ) 4 MG tablet TAKE ONE-HALF TABLET BY MOUTH  DAILY 50 tablet 2 Past Week   fish oil-omega-3 fatty acids  1000 MG capsule Take 1,000 mg by mouth daily.   Past Week   lisinopril  (ZESTRIL ) 40 MG tablet TAKE 1 TABLET BY MOUTH DAILY 100 tablet 2 Past Week   Multiple Vitamin (MULTIVITAMIN) tablet Take 1 tablet by mouth daily.   Past Week   pantoprazole  (PROTONIX ) 40 MG tablet Take 1 tablet (40 mg total) by mouth daily. 30 tablet 0 Past Week   polyethylene glycol (GOLYTELY ) 236 g solution 5pm the evening before, mix very well and drink 8 ounces every 20 minutes until (1/2) of the liquid has been completed On the day of procedure, 5 hours before, drink 8 ounces every 15 minutes until complete 4000 mL 0 12/01/2023   pravastatin  (PRAVACHOL ) 40 MG tablet Take 1 tablet (40 mg total) by mouth daily. 09/29/23-PT NEEDS APPOINTMENT 30 tablet 0 Past Month   solifenacin  (VESICARE ) 10 MG tablet Take 1 tablet (10  mg total) by mouth daily. 90 tablet 3 Past Week   traZODone  (DESYREL ) 50 MG tablet TAKE 1 TABLET BY MOUTH AT  BEDTIME 90 tablet 3 Past Week   BREZTRI  AEROSPHERE 160-9-4.8 MCG/ACT AERO USE 2 INHALATIONS BY MOUTH INTO  THE LUNGS IN THE MORNING AND AT  BEDTIME (Patient not taking: Reported on 11/14/2023) 32.1 g 3 Not Taking   feeding supplement (ENSURE SURGERY) LIQD Take 237 mLs by mouth 2 (two) times daily between meals.       Assessment: Pharmacy consulted to dose heparin  in this 83 year old female admitted with PE.  No prior anticoag noted. CrCl = 37.2 ml/min   Goal of Therapy:  Heparin  level 0.3-0.7 units/ml Monitor platelets by anticoagulation protocol: Yes   Plan:  Give 5000 units bolus x 1 Start heparin  infusion at 1350 units/hr Check  anti-Xa level in 8 hours and daily while on heparin  Continue to monitor H&H and platelets  Elouise Divelbiss D 12/08/2023,7:17 AM

## 2023-12-08 NOTE — Progress Notes (Signed)
 PT Cancellation Note  Patient Details Name: Virginia Walters MRN: [id number] DOB: Nov 29, 1940   Cancelled Treatment:    Reason Eval/Treat Not Completed: Medical issues which prohibited therapy (per chart, rapid respone called last night, acute PE found. Will follow facility protocol and defer PT services until 48hrs therapeutic anticoagulation.)  1:14 PM, 12/08/23 Alvah Lagrow C Kingjames Coury, PT, DPT Physical Therapist - [facility]  [phone] (ASCOM)    Damion Kant C 12/08/2023, 1:14 PM

## 2023-12-08 NOTE — TOC PASRR Note (Signed)
 [name]   Date of Birth: 2041-02-09   Date:12/08/23    To Whom It May Concern:   Please be advised that the above-named patient has a primary diagnosis of dementia which supersedes any psychiatric diagnosis.

## 2023-12-08 NOTE — TOC PASRR Note (Cosign Needed)
 RE: Virginia Walters Date of Birth: August 15, 2041 Date: 12/08/23      To Whom It May Concern:   Please be advised that the above-named patient will require a short-term nursing home stay - anticipated 30 days or less for rehabilitation and strengthening.  The plan is for return home

## 2023-12-08 NOTE — Consult Note (Signed)
 Hospital Consult    Reason for Consult:  Pulmonary Embolism Requesting Physician:  Dr Alvenia Aus MD  MRN #:  [id number]  History of Present Illness: This is a 83 y.o. female with medical history significant for HTN, HLD, COPD, HFpEF( EF 55-60% 11/16/2023), alcohol use disorder, chronic back pain, neuropathy, recently admitted from 4/1 to 4/4 with symptomatic anemia requiring 3 units PRBCs, complicated by acute encephalopathy attributed to delirium versus alcohol abuse versus baseline dementia, referred for outpatient GI workup and IV iron  with hematology, with good being admitted with a syncopal episode that occurred during the course of a colonoscopy prep.   Upon further workup here at [facility] patient underwent upper endoscopy and colonoscopy.  No bleeding was found at that time.  Patient continues to have hypoxia and difficulty with ambulation getting short of breath very easily.  She underwent a CT of the chest with PE protocol.  Patient was then found to have multiple submental and subsegmental pulmonary emboli which appear to be acute acute.  Patient did not have an indication for right heart strain but there was significant pulmonary artery dilatation compatible with chronic pulmonary hypertension.  She also had a small pulmonary infarct of the right lung base.  Both of these which could be significantly contributing to her hypoxia and difficulty breathing.  Vascular surgery consulted to evaluate.   Past Medical History:  Diagnosis Date   (HFpEF) heart failure with preserved ejection fraction (HCC)    a.) TTE 07/22/2018: EF 45-50%; mild LVH; mild diffuse HK; mild LA dilation; PASP 30-35 mmHg; G1DD.   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   Weakness     Past Surgical History:  Procedure Laterality Date   ANKLE ARTHROSCOPY WITH OPEN REDUCTION INTERNAL FIXATION (ORIF) Right    APPENDECTOMY     CHOLECYSTECTOMY     COLONOSCOPY N/A 07/21/2018   Procedure: COLONOSCOPY;  Surgeon: Selena Daily, MD;  Location: [facility] ENDOSCOPY;  Service: Gastroenterology;  Laterality: N/A;   COLONOSCOPY N/A 11/20/2023   Procedure: COLONOSCOPY;  Surgeon: Marnee Sink, MD;  Location: [facility] ENDOSCOPY;  Service: Endoscopy;  Laterality: N/A;   COLONOSCOPY N/A 11/27/2023   Procedure: COLONOSCOPY;  Surgeon: Marnee Sink, MD;  Location: [facility] ENDOSCOPY;  Service: Endoscopy;  Laterality: N/A;   COLONOSCOPY N/A 12/06/2023   Procedure: COLONOSCOPY;  Surgeon: Marnee Sink, MD;  Location: [facility] ENDOSCOPY;  Service: Endoscopy;  Laterality: N/A;  COLONOSCOPY WITH PROPOFOL  N/A 04/01/2020   Procedure: COLONOSCOPY WITH PROPOFOL ;  Surgeon: Irby Mannan, MD;  Location: [facility] ENDOSCOPY;  Service: Endoscopy;  Laterality: N/A;   ESOPHAGOGASTRODUODENOSCOPY N/A 07/21/2018   Procedure: ESOPHAGOGASTRODUODENOSCOPY (EGD);  Surgeon: Selena Daily, MD;  Location: [facility] ENDOSCOPY;  Service: Gastroenterology;  Laterality: N/A;   ESOPHAGOGASTRODUODENOSCOPY N/A 02/11/2020    Procedure: ESOPHAGOGASTRODUODENOSCOPY (EGD);  Surgeon: Irby Mannan, MD;  Location: [facility] ENDOSCOPY;  Service: Endoscopy;  Laterality: N/A;   ESOPHAGOGASTRODUODENOSCOPY N/A 11/20/2023   Procedure: EGD (ESOPHAGOGASTRODUODENOSCOPY);  Surgeon: Marnee Sink, MD;  Location: [facility] ENDOSCOPY;  Service: Endoscopy;  Laterality: N/A;   ESOPHAGOGASTRODUODENOSCOPY N/A 11/27/2023   Procedure: EGD (ESOPHAGOGASTRODUODENOSCOPY);  Surgeon: Marnee Sink, MD;  Location: [facility] ENDOSCOPY;  Service: Endoscopy;  Laterality: N/A;   ESOPHAGOGASTRODUODENOSCOPY N/A 12/06/2023   Procedure: EGD (ESOPHAGOGASTRODUODENOSCOPY);  Surgeon: Marnee Sink, MD;  Location: [facility] ENDOSCOPY;  Service: Endoscopy;  Laterality: N/A;   ESOPHAGOGASTRODUODENOSCOPY (EGD) WITH PROPOFOL  N/A 04/01/2020   Procedure: ESOPHAGOGASTRODUODENOSCOPY (EGD) WITH PROPOFOL ;  Surgeon: Irby Mannan, MD;  Location: [facility] ENDOSCOPY;  Service: Endoscopy;  Laterality: N/A;   FRACTURE SURGERY Left    leg fx   GIVENS CAPSULE STUDY  12/06/2023   Procedure: IMAGING PROCEDURE, GI TRACT, INTRALUMINAL, VIA CAPSULE;  Surgeon: Marnee Sink, MD;  Location: [facility] ENDOSCOPY;  Service: Endoscopy;;   IR EXCHANGE BILIARY DRAIN  10/22/2021   IR PERC CHOLECYSTOSTOMY  09/17/2021   TUBAL LIGATION      Allergies  Allergen Reactions   Ibuprofen     Oxycodone  Itching    Prior to Admission medications   Medication Sig Start Date End Date Taking? Authorizing Provider  Ascorbic Acid  (VITAMIN C  PO) Take 1 tablet by mouth daily.   Yes [provider]  dorzolamide  (TRUSOPT ) 2 % ophthalmic solution Place 1 drop into the left eye 2 (two) times daily.   Yes [provider]  doxazosin  (CARDURA ) 4 MG tablet TAKE ONE-HALF TABLET BY MOUTH  DAILY 10/04/23  Yes Austine Lefort, MD  fish oil-omega-3 fatty acids  1000 MG capsule Take 1,000 mg by mouth daily.   Yes [provider]  lisinopril  (ZESTRIL ) 40 MG tablet TAKE 1 TABLET BY MOUTH DAILY 10/04/23  Yes  Austine Lefort, MD  Multiple Vitamin (MULTIVITAMIN) tablet Take 1 tablet by mouth daily.   Yes [provider]  pantoprazole  (PROTONIX ) 40 MG tablet Take 1 tablet (40 mg total) by mouth daily. 11/17/23 12/17/23 Yes Alphonsus Jeans, MD  polyethylene glycol (GOLYTELY ) 236 g solution 5pm the evening before, mix very well and drink 8 ounces every 20 minutes until (1/2) of the liquid has been completed On the day of procedure, 5 hours before, drink 8 ounces every 15 minutes until complete 11/30/23  Yes Marnee Sink, MD  pravastatin  (PRAVACHOL ) 40 MG tablet Take 1 tablet (40 mg total) by mouth daily. 09/29/23-PT NEEDS APPOINTMENT 09/29/23  Yes Austine Lefort, MD  solifenacin  (VESICARE ) 10 MG tablet Take 1 tablet (10 mg total) by mouth daily. 11/13/23  Yes Austine Lefort, MD  traZODone  (DESYREL ) 50 MG tablet TAKE 1 TABLET BY MOUTH AT  BEDTIME 10/20/23  Yes Austine Lefort, MD  BREZTRI  AEROSPHERE 160-9-4.8 MCG/ACT AERO USE 2 INHALATIONS BY MOUTH INTO  THE LUNGS IN THE MORNING AND AT  BEDTIME Patient not taking: Reported on 11/14/2023 12/07/21   Austine Lefort, MD  feeding supplement (ENSURE SURGERY) LIQD Take 237 mLs by mouth 2 (two) times daily between meals. 09/21/21   Verlyn Goad, MD  Social History   Socioeconomic History   Marital status: Widowed    Spouse name: Not on file   Number of children: 1   Years of education: HS   Highest education level: Not on file  Occupational History   Occupation: Retired  Tobacco Use   Smoking status: Former    Current packs/day: 0.00    Average packs/day: 1 pack/day for 15.0 years (15.0 ttl pk-yrs)    Types: Cigarettes    Start date: 12/27/1965    Quit date: 12/27/1980    Years since quitting: 42.9   Smokeless tobacco: Never  Vaping Use   Vaping status: Never Used  Substance and Sexual Activity   Alcohol use: Yes    Alcohol/week: 1.0 standard drink of alcohol    Types: 1 Glasses of wine per week   Drug use: No   Sexual activity: Not  Currently  Other Topics Concern   Not on file  Social History Narrative   Lives at home alone. Husband passed away 12-27-12.   Right-handed.   No daily use of caffeine.   Daughter lives in [place] .   Social Drivers of Corporate investment banker Strain: Low Risk  (05/25/2022)   Overall Financial Resource Strain (CARDIA)    Difficulty of Paying Living Expenses: Not hard at all  Food Insecurity: No Food Insecurity (12/02/2023)   Hunger Vital Sign    Worried About Running Out of Food in the Last Year: Never true    Ran Out of Food in the Last Year: Never true  Transportation Needs: No Transportation Needs (12/02/2023)   PRAPARE - Administrator, Civil Service (Medical): No    Lack of Transportation (Non-Medical): No  Physical Activity: Inactive (05/25/2022)   Exercise Vital Sign    Days of Exercise per Week: 0 days    Minutes of Exercise per Session: 0 min  Stress: No Stress Concern Present (05/25/2022)   [employer] of Occupational Health - Occupational Stress Questionnaire    Feeling of Stress : Not at all  Social Connections: Moderately Integrated (12/02/2023)   Social Connection and Isolation Panel [NHANES]    Frequency of Communication with Friends and Family: More than three times a week    Frequency of Social Gatherings with Friends and Family: More than three times a week    Attends Religious Services: More than 4 times per year    Active Member of [employer] or Organizations: Yes    Attends Banker Meetings: More than 4 times per year    Marital Status: Widowed  Intimate Partner Violence: Not At Risk (12/02/2023)   Humiliation, Afraid, Rape, and Kick questionnaire    Fear of Current or Ex-Partner: No    Emotionally Abused: No    Physically Abused: No    Sexually Abused: No     Family History  Problem Relation Age of Onset   Esophageal cancer Father    Dementia Mother     ROS: Otherwise negative unless mentioned in HPI  Physical  Examination  Vitals:   12/08/23 0359 12/08/23 0750  BP: (!) 146/68 (!) 151/114  Pulse: 86 89  Resp: (!) 34 (!) 21  Temp: 97.7 F (36.5 C) 98.8 F (37.1 C)  SpO2: 96% 95%   Body mass index is 35.15 kg/m.  General:  WDWN in NAD Gait: Not observed HENT: WNL, normocephalic Pulmonary: Positive labored breathing, with scattered Rhonchi and wheezing. No rales noted. Patient noted to be hypoxic and using 4 liters [place]  Oxygen   Cardiac: regular, Tachycardia 110-120, without  Murmurs, rubs or gallops; without carotid bruits Abdomen: Positive bowel sounds throughout, soft, NT/ND, no masses Skin: without rashes Vascular Exam/Pulses: Positive palpable pulses in upper extremities. Unable to palpate bilateral lower extremity pulses. No edema noted.  Extremities: without ischemic changes, without Gangrene , without cellulitis; without open wounds;  Musculoskeletal: no muscle wasting or atrophy  Neurologic: A&O X 3;  No focal weakness or paresthesias are detected; speech is fluent/normal Psychiatric:  The pt has Normal affect. Lymph:  Unremarkable  CBC    Component Value Date/Time   WBC 7.5 12/08/2023 0411   RBC 3.76 (L) 12/08/2023 0411   HGB 9.2 (L) 12/08/2023 0411   HCT 30.2 (L) 12/08/2023 0411   PLT 206 12/08/2023 0411   MCV 80.3 12/08/2023 0411   MCH 24.5 (L) 12/08/2023 0411   MCHC 30.5 12/08/2023 0411   RDW 21.2 (H) 12/08/2023 0411   LYMPHSABS 1.2 12/08/2023 0411   MONOABS 0.9 12/08/2023 0411   EOSABS 0.8 (H) 12/08/2023 0411   BASOSABS 0.1 12/08/2023 0411    BMET    Component Value Date/Time   NA 140 12/08/2023 0411   K 3.7 12/08/2023 0411   K 4.2 07/22/2013 1325   CL 111 12/08/2023 0411   CO2 22 12/08/2023 0411   GLUCOSE 117 (H) 12/08/2023 0411   BUN 22 12/08/2023 0411   BUN 26 (A) 08/24/2009 0000   CREATININE 1.43 (H) 12/08/2023 0411   CREATININE 1.12 (H) 11/13/2023 0939   CALCIUM  8.5 (L) 12/08/2023 0411   GFRNONAA 37 (L) 12/08/2023 0411   GFRNONAA 39 (L) 11/13/2020  1438   GFRAA 45 (L) 11/13/2020 1438    COAGS: Lab Results  Component Value Date   INR 1.4 (H) 09/17/2021   INR 1.1 07/22/2021     Non-Invasive Vascular Imaging:   EXAM:12/08/23 CT ANGIOGRAPHY CHEST WITH CONTRAST   TECHNIQUE: Multidetector CT imaging of the chest was performed using the standard protocol during bolus administration of intravenous contrast. Multiplanar CT image reconstructions and MIPs were obtained to evaluate the vascular anatomy.   RADIATION DOSE REDUCTION: This exam was performed according to the departmental dose-optimization program which includes automated exposure control, adjustment of the mA and/or kV according to patient size and/or use of iterative reconstruction technique.   CONTRAST:  75mL OMNIPAQUE  IOHEXOL  350 MG/ML SOLN   COMPARISON:  None Available.   FINDINGS: Cardiovascular: Satisfactory opacification of the pulmonary arteries to the segmental level. Multiple branching pulmonary artery filling defects seen in subsegmental vessels and segmental right upper lobe vessel. These are marked on series 4. The main pulmonary artery is dilated to 3.7 cm. No right heart dilatation when compared to the left. Minimal subendocardial fat deposition at the left ventricle which may be related to prior ischemia. Extensive atheromatous calcification of the aorta and coronaries.   Mediastinum/Nodes: Negative for mass or adenopathy. Moderate hiatal hernia.   Lungs/Pleura: Mild ground-glass opacity at the right base which could be atelectasis or small pulmonary infarct. Reticulation of lung markings with a few branching micro nodules, likely chronic lung disease and stable from 2019 chest CT. Scarring is greatest at the lung bases, no honeycombing.   Upper Abdomen: No acute finding   Musculoskeletal: Extensive spondylosis with multi-level bridging osteophyte   Review of the MIP images confirms the above findings.   Critical Value/emergent results  were called by telephone at the time of interpretation on 12/08/2023 at 7:01 am to provider [facility] , who verbally acknowledged these results.  IMPRESSION: Multiple segmental to subsegmental pulmonary emboli which appear acute. No indication of right heart strain ; there is pulmonary artery dilatation compatible with chronic pulmonary hypertension.   Atelectasis or small pulmonary infarct at the right lung base.   Sliding hiatal hernia.   Atherosclerosis including the coronary arteries.  Statin:  Yes.   Beta Blocker:  No. Aspirin :  No. ACEI:  Yes.   ARB:  No. CCB use:  No Other antiplatelets/anticoagulants:  No.    ASSESSMENT/PLAN: This is a 83 y.o. female who presented to [facility] emergency room after possible syncopal episode while doing a colonoscopy prep.  Patient was found outside of her home confused.  She currently lives alone while family members reside in [place] .  Patient was admitted to the hospital for further evaluation of acute metabolic encephalopathy with questionable syncopal episode and acute kidney injury.  GI was consulted on admission.  Upon workup she underwent upper EGD and colonoscopy.  Nothing significant was found by GI.  They attempted capsule endoscopy but due to her immobility and her of hypoxia there were no findings.  They recommended the patient follow-up as outpatient for capsule endoscopy.  They also recommend she avoid further alcohol use.  Her hypoxia continued and the patient underwent chest CT with PE protocol.  Patient was found to have multiple segmental and subsegmental pulmonary emboli with possible right lower lobe infarct.  After reviewing the CT scan with Dr. Devon Fogo MD vascular surgery plans on taking the patient to the vascular lab for a pulmonary thrombectomy to help alleviate patient's dyspnea with exertion.  I had a long detailed discussion with the patient's daughter and son-in-law in the hallway this morning concerning the  patient's condition.  Patient is noted to have some level of encephalopathy and dementia and therefore they are her power of attorney.  We discussed in detail the procedure, benefits, risk, and complications.  They verbalized her understanding and wished to get her back on her feet as she lives alone so they would like to proceed with the procedure as soon as possible.  I answered all their questions this morning.  Patient will be made n.p.o. and we will plan on pulmonary thrombectomy later this afternoon on 12/08/2023.   - I discussed the case in detail with Dr. Devon Fogo MD and he agrees with the plan.   Annamaria Barrette [facility] 12/08/2023 10:39 AM

## 2023-12-08 NOTE — Significant Event (Addendum)
 CROSS COVER NOTE  NAME: Virginia Walters MRN: [id number] DOB : 06-Feb-1941 ATTENDING PHYSICIAN: Ezzard Holms, MD    Date of Service   12/08/2023   HPI/Events of Note   Nurse reports via secure chat pt admitted with ams and synciope from home. pt is wheezing, gave breathing treatment with no relief. alert to self at this time. her resp is 36, 166/67, 82. mews now yellow. does not sound wet to me. anything you want me to do other than this breathing treatment. melissa   Chart review patient recently hospitalized beginning of April for symptomatic anemia. Readmitted 4/18 with acute metabolic encephalopathy and dehydration. She has significcant  to current state, chronic conditions of  COPD and chronic systolic heart failure  EF 45-50% , CKD III,   Interventions   Assessment/Plan:    12/08/2023    3:59 AM 12/08/2023    2:29 AM 12/08/2023    2:22 AM  Vitals with BMI  Systolic 146 158 657  Diastolic 68 73 67  Pulse 86 89 82   Patient alert to name called. Increased work of breathing with stimulation or movements in bed. Crackles BLL and exp wheeze CV - no pitting edema mucosa dry, cardiomegaly S1S2 no murmurs  Currently with SCD in place for dvt prophylaxis Chest xray FINDINGS: Cardiomegaly. Increased bronchitic changes and patchy ill-defined pulmonary opacities. No pleural effusion. Hiatal hernia. IMPRESSION: Increased bronchitic changes and patchy ill-defined pulmonary opacities, suspect respiratory infection, possibly with atypical organism.  Cardiomegaly CBC unremarkable BNP 55 Ddimer 3.07 Procal and ddimer 3.07 Procal and whtite count normal Cmp  Latest Reference Range & Units 12/08/23 04:11  COMPREHENSIVE METABOLIC PANEL WITH GFR  Rpt !  Sodium 135 - 145 mmol/L 140  Potassium 3.5 - 5.1 mmol/L 3.7  Chloride 98 - 111 mmol/L 111  CO2 22 - 32 mmol/L 22  Glucose 70 - 99 mg/dL 846 (H)  BUN 8 - 23 mg/dL 22  Creatinine 9.62 - 9.52 mg/dL 8.41 (H)  Calcium  8.9 - 10.3  mg/dL 8.5 (L)  Anion gap 5 - 15  7  Magnesium  1.7 - 2.4 mg/dL 1.7  Alkaline Phosphatase 38 - 126 U/L 50  Albumin  3.5 - 5.0 g/dL 2.9 (L)  AST 15 - 41 U/L 18  ALT 0 - 44 U/L 18  Total Protein 6.5 - 8.1 g/dL 5.7 (L)  Total Bilirubin 0.0 - 1.2 mg/dL 0.6  GFR, Estimated >32 mL/min 37 (L)  !: Data is abnormal (H): Data is abnormally high (L): Data is abnormally low Rpt: View report in Results Review for more information Vbg   Latest Reference Range & Units 12/08/23 04:11  Delivery systems  NASAL CANNULA  O2 Content L/min 2.0  pH, Ven 7.25 - 7.43  7.4  pCO2, Ven 44 - 60 mmHg 42 (L)  pO2, Ven 32 - 45 mmHg 70 (H)  Acid-Base Excess 0.0 - 2.0 mmol/L 1.0  Bicarbonate 20.0 - 28.0 mmol/L 26.0  O2 Saturation % 96  Patient temperature  37.0  Collection site  VEIN  (L): Data is abnormally low (H): Data is abnormally high  DDX chf, copd exacerbation PE Wells score  CTA chest r/o PE BIPAP prn work of breathing Pulmicort  BID Solumedrol 125 mg IV x1 mag 2 gms IV      Kip Peon NP [facility] Cross Cover 7pm-7am - check amion for availability Pager [phone]    Page from radiology + bilateral PE No right heart strain  Heparin  per pharmacy consult Attending informed

## 2023-12-09 DIAGNOSIS — I2699 Other pulmonary embolism without acute cor pulmonale: Secondary | ICD-10-CM | POA: Diagnosis not present

## 2023-12-09 LAB — BASIC METABOLIC PANEL WITH GFR
Anion gap: 7 (ref 5–15)
BUN: 26 mg/dL — ABNORMAL HIGH (ref 8–23)
CO2: 23 mmol/L (ref 22–32)
Calcium: 8.4 mg/dL — ABNORMAL LOW (ref 8.9–10.3)
Chloride: 111 mmol/L (ref 98–111)
Creatinine, Ser: 1.14 mg/dL — ABNORMAL HIGH (ref 0.44–1.00)
GFR, Estimated: 48 mL/min — ABNORMAL LOW (ref >60)
Glucose, Bld: 94 mg/dL (ref 70–99)
Potassium: 3.9 mmol/L (ref 3.5–5.1)
Sodium: 141 mmol/L (ref 135–145)

## 2023-12-09 LAB — GLUCOSE, CAPILLARY: Glucose-Capillary: 87 mg/dL (ref 70–99)

## 2023-12-09 LAB — HEPARIN LEVEL (UNFRACTIONATED)
Heparin Unfractionated: 0.68 [IU]/mL (ref 0.30–0.70)
Heparin Unfractionated: 0.5 [IU]/mL (ref 0.30–0.70)

## 2023-12-09 LAB — CBC WITH DIFFERENTIAL/PLATELET
Abs Immature Granulocytes: 0.06 10*3/uL (ref 0.00–0.07)
Basophils Absolute: 0 10*3/uL (ref 0.0–0.1)
Basophils Relative: 0 %
Eosinophils Absolute: 0.1 10*3/uL (ref 0.0–0.5)
Eosinophils Relative: 1 %
HCT: 27.8 % — ABNORMAL LOW (ref 36.0–46.0)
Hemoglobin: 8.8 g/dL — ABNORMAL LOW (ref 12.0–15.0)
Immature Granulocytes: 1 %
Lymphocytes Relative: 14 %
Lymphs Abs: 1.3 10*3/uL (ref 0.7–4.0)
MCH: 25 pg — ABNORMAL LOW (ref 26.0–34.0)
MCHC: 31.7 g/dL (ref 30.0–36.0)
MCV: 79 fL — ABNORMAL LOW (ref 80.0–100.0)
Monocytes Absolute: 0.9 10*3/uL (ref 0.1–1.0)
Monocytes Relative: 9 %
Neutro Abs: 7.2 10*3/uL (ref 1.7–7.7)
Neutrophils Relative %: 75 %
Platelets: 194 10*3/uL (ref 150–400)
RBC: 3.52 MIL/uL — ABNORMAL LOW (ref 3.87–5.11)
RDW: 21.1 % — ABNORMAL HIGH (ref 11.5–15.5)
Smear Review: NORMAL
WBC: 9.6 10*3/uL (ref 4.0–10.5)
nRBC: 0 % (ref 0.0–0.2)

## 2023-12-09 NOTE — Progress Notes (Addendum)
 Progress Note   [name]:[id number] DOB: [birth year] DOA: 12/01/2023     7 DOS: the patient was seen and examined on 12/09/2023     Brief hospital course: HPI on admission by Dr. Vallarie Gauze:  " Virginia Walters is a 83 y.o. female with medical history significant for HTN, HLD, COPD, HFpEF( EF 55-60% 11/16/2023), alcohol use disorder, chronic back pain, neuropathy, recently admitted from 4/1 to 4/4 with symptomatic anemia requiring 3 units PRBCs, complicated by acute encephalopathy attributed to delirium versus alcohol abuse versus baseline dementia, referred for outpatient GI workup and IV iron  with hematology, with good being admitted with a syncopal episode that occurred during the course of a colonoscopy prep. Tomorrow.  Patient was found sitting outside confused.  Per triage note, there was evidence of a trail of fecal incontinence from where she was sitting to inside the house and within the house.  Patient is currently oriented to person and place and is not sure about the circumstances but states she must have passed out. ED course and data review:Vitals within normal limits. Labs notable for AKI with creatinine 3.28 up from 1.03 a couple weeks prior. Hemoglobin 10.5 up from 9.3 a couple weeks aago. K+ 2.8 WBC and lactic acid normal Etoh <10 EKG with NSR at 67 CT head non acute "   Patient was admitted for further evaluation and management of acute metabolic encephalopathy and question of possible syncopal episode and AKI.  GI was consulted on admission, they do not plan for endoscopy before Monday, if acute issues are improved.    Further hospital course and management as outlined below.   4/19 -- Not UA appears grossly positive for UTI.  Started empiric IV Rocephin  and added on a urine culture.  Pt more alert but remains very confused, oriented only to self. 4/20 -- mental status improved somewhat, baseline unclear.  Cr improving.  Urine culture +E coli, remains on  empiric Rocephin .  No plan for endoscopy tomorrow per GI, needs further clinical improvement before bowel prep for procedure. 4/21 -- changed to PO Keflex .   4/22 -- GI started bowel prep, endoscopies tomorrow     Assessment and Plan:   Acute hypoxic respiratory failure secondary to multiple pulmonary embolism This morning patient developed worsening respiratory function and CT scan of the chest obtained showed extensive pulmonary embolism.  Plan of care discussed with vascular surgery team Patient being planned for possible thrombectomy next week Continue heparin  drip for now     UTI - POA.  Not septic. 4/20 -- urine culture grew pan-sensitive E.coli Has completed 5 days course of antibiotic therapy   Possible Syncope - in setting of pt doing bowel prep Acute metabolic encephalopathy - due to UTI, as well as possible alcohol use CT head non-acute. No focal neurologic deficits. History from [name], patient's cousin - no history of dementia - pt lives independently, drives & manages ADL's etc without assistance.  --Empiric high dose IV thiamine  500 mg IV TID for 5 days course Continue to follow-up on thiamine  levels --Consider MRI brain Monitor CBC closely --Neurologic checks with fall and aspiration precautions Continue to monitor on delirium precaution   AKI (acute kidney injury)  Baseline Cr 1.0-1.1. Admitted with Cr 3.28 with pre-renal azotemia Monitor renal function closely   Dementia,  Continue delirium precaution   Symptomatic anemia Presumed GI.  Had recent transfusion of 3 units PRBCs and Venofer  -Status post EGD, colonoscopy and complaints capsule endoscopy Continue current diet Monitor  hemoglobin closely and transfuse as needed to maintain hemoglobin greater than 8   COPD (chronic obstructive pulmonary disease) (HCC) Not acutely exacerbated Continue as needed nebulization   Chronic systolic heart failure (HCC) EF 55 to 60% with G1 DD.  History of systolic heart  failure Continue lisinopril  as renal function is improved   Alcohol use disorder EtOH level undetectable on admission Pt's cousin reports history of drinking wine 4-5+ glasses per day when she is drinking, but looked around pt's home and garbage cans and did not see evidence of recent drinking. --Cancelled CIWA protocol   HTN (hypertension) Continue lisinopril  40 mg daily Continue as needed hydralazine        Subjective:  Patient seen and examined at bedside this morning Still requiring 3 L of intranasal oxygen  Admits to some improvement Denies nausea vomiting abdominal pain   Physical Exam: General exam: awake, alert, no acute distress, obese, pleasantly confused HEENT: moist mucus membranes, hearing grossly normal  Respiratory system: expiratory wheezes, normal respiratory effort on room air. Cardiovascular system: RRR,no peripheral edema, normal S1/S2 Gastrointestinal system: soft, NT, ND Central nervous system: A&O x self and hospital, not year or situation. no gross focal neurologic deficits, normal speech Extremities: moves all, no edema, normal tone Psychiatry: normal mood, congruent affect, confused, abnormal judgment and insight     Family Communication: [name] was updated in detail by phone this afternoon.     Disposition: Status is: Inpatient Remains inpatient appropriate because: --SNF placement recommended.      Planned Discharge Destination: PT has recommended skilled nursing facility placement     Time spent: 40 minutes    Data Reviewed:      Latest Ref Rng & Units 12/09/2023    5:00 AM 12/08/2023    4:11 AM 12/07/2023    3:50 AM  BMP  Glucose 70 - 99 mg/dL 94  409  78   BUN 8 - 23 mg/dL 26  22  18    Creatinine 0.44 - 1.00 mg/dL 8.11  9.14  7.82   Sodium 135 - 145 mmol/L 141  140  144   Potassium 3.5 - 5.1 mmol/L 3.9  3.7  4.2   Chloride 98 - 111 mmol/L 111  111  112   CO2 22 - 32 mmol/L 23  22  23    Calcium  8.9 - 10.3 mg/dL 8.4   8.5  8.8      Vitals:   12/08/23 2029 12/09/23 0433 12/09/23 0528 12/09/23 0749  BP: (!) 167/73 (!) 167/80  (!) 132/59  Pulse: 91 78  82  Resp: 18 16  16   Temp: 98.1 F (36.7 C) 98.3 F (36.8 C)  97.6 F (36.4 C)  TempSrc:  Axillary  Oral  SpO2: 100% 96% 95% 95%  Weight:  102.9 kg    Height:          Latest Ref Rng & Units 12/09/2023    5:00 AM 12/08/2023    4:11 AM 12/07/2023    3:50 AM  CBC  WBC 4.0 - 10.5 K/uL 9.6  7.5  6.2   Hemoglobin 12.0 - 15.0 g/dL 8.8  9.2  9.7   Hematocrit 36.0 - 46.0 % 27.8  30.2  31.8   Platelets 150 - 400 K/uL 194  206  222      Author: Ezzard Holms, MD 12/09/2023 2:40 PM  For on call review [email or web address].

## 2023-12-09 NOTE — Progress Notes (Signed)
 PHARMACY - ANTICOAGULATION CONSULT NOTE  Pharmacy Consult for Heparin   Indication: pulmonary embolus  Allergies  Allergen Reactions   Ibuprofen     Oxycodone  Itching    Patient Measurements: Height: 5\' 7"  (170.2 cm) Weight: 101.8 kg (224 lb 6.9 oz) IBW/kg (Calculated) : 61.6 HEPARIN  DW (KG): 84.8  Vital Signs: Temp: 98.1 F (36.7 C) (04/25 2029) BP: 167/73 (04/25 2029) Pulse Rate: 91 (04/25 2029)  Labs: Recent Labs    12/06/23 0419 12/07/23 0350 12/08/23 0411 12/08/23 1546 12/09/23 0240  HGB  --  9.7* 9.2*  --   --   HCT  --  31.8* 30.2*  --   --   PLT  --  222 206  --   --   HEPARINUNFRC  --   --   --  0.83* 0.68  CREATININE 1.26* 1.43* 1.43*  --   --     Estimated Creatinine Clearance: 37.2 mL/min (A) (by C-G formula based on SCr of 1.43 mg/dL (H)).   Medical History: Past Medical History:  Diagnosis Date   (HFpEF) heart failure with preserved ejection fraction (HCC)    a.) TTE 07/22/2018: EF 45-50%; mild LVH; mild diffuse HK; mild LA dilation; PASP 30-35 mmHg; G1DD.   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   Past Week   dorzolamide  (TRUSOPT ) 2 % ophthalmic solution Place 1 drop into the left eye 2 (two) times daily.   Past Week   doxazosin  (CARDURA ) 4 MG tablet TAKE ONE-HALF TABLET BY MOUTH  DAILY 50 tablet 2 Past Week   fish oil-omega-3 fatty acids  1000 MG capsule Take 1,000 mg by mouth daily.   Past Week   lisinopril  (ZESTRIL ) 40 MG tablet TAKE 1 TABLET BY MOUTH DAILY 100 tablet 2 Past Week   Multiple Vitamin (MULTIVITAMIN) tablet Take 1 tablet by mouth daily.   Past Week   pantoprazole  (PROTONIX ) 40 MG tablet Take 1 tablet (40 mg total) by mouth daily. 30 tablet 0 Past Week   polyethylene glycol (GOLYTELY ) 236 g solution 5pm the evening before, mix very well and drink 8 ounces every 20 minutes until (1/2) of the liquid has been completed On the day of procedure, 5 hours before, drink 8 ounces every 15 minutes  until complete 4000 mL 0 12/01/2023   pravastatin  (PRAVACHOL ) 40 MG tablet Take 1 tablet (40 mg total) by mouth daily. 09/29/23-PT NEEDS APPOINTMENT 30 tablet 0 Past Month   solifenacin  (VESICARE ) 10 MG tablet Take 1 tablet (10 mg total) by mouth daily. 90 tablet 3 Past Week   traZODone  (DESYREL ) 50 MG tablet TAKE 1 TABLET BY MOUTH AT  BEDTIME 90 tablet 3 Past Week   BREZTRI  AEROSPHERE 160-9-4.8 MCG/ACT AERO USE 2 INHALATIONS BY MOUTH INTO  THE LUNGS IN THE MORNING AND AT  BEDTIME (Patient not taking: Reported on 11/14/2023) 32.1 g 3 Not Taking   feeding supplement (ENSURE SURGERY) LIQD Take 237 mLs by mouth 2 (two) times daily between meals.       Assessment: Pharmacy consulted to dose heparin  in this 83 year old female admitted with PE.  No prior anticoag noted. CrCl = 37.2 ml/min   04/25 1546 HL 0.83 04/26 0240 HL 0.68   Goal of Therapy:  Heparin  level  0.3-0.7 units/ml Monitor platelets by anticoagulation protocol: Yes   Plan:  4/26:  HL @ 0240 = 0.68, therapeutic X 1 - will continue pt on current rate and recheck HL in 8 hrs  CBC daily while on heparin   Jeramey Lanuza D 12/09/2023,3:47 AM

## 2023-12-09 NOTE — Progress Notes (Addendum)
 PHARMACY - ANTICOAGULATION CONSULT NOTE  Pharmacy Consult for Heparin   Indication: pulmonary embolus  Allergies  Allergen Reactions   Ibuprofen     Oxycodone  Itching   Patient Measurements: Height: 5\' 7"  (170.2 cm) Weight: 102.9 kg (226 lb 13.7 oz) IBW/kg (Calculated) : 61.6 HEPARIN  DW (KG): 84.8  Vital Signs: Temp: 97.6 F (36.4 C) (04/26 0749) Temp Source: Oral (04/26 0749) BP: 132/59 (04/26 0749) Pulse Rate: 82 (04/26 0749)  Labs: Recent Labs    12/07/23 0350 12/08/23 0411 12/08/23 1546 12/09/23 0240 12/09/23 0500  HGB 9.7* 9.2*  --   --  8.8*  HCT 31.8* 30.2*  --   --  27.8*  PLT 222 206  --   --  194  HEPARINUNFRC  --   --  0.83* 0.68  --   CREATININE 1.43* 1.43*  --   --  1.14*   Estimated Creatinine Clearance: 46.9 mL/min (A) (by C-G formula based on SCr of 1.14 mg/dL (H)).  Medical History: Past Medical History:  Diagnosis Date   (HFpEF) heart failure with preserved ejection fraction (HCC)    a.) TTE 07/22/2018: EF 45-50%; mild LVH; mild diffuse HK; mild LA dilation; PASP 30-35 mmHg; G1DD.   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   Past Week   dorzolamide  (TRUSOPT ) 2 % ophthalmic solution Place 1 drop into the left eye 2 (two) times daily.   Past Week   doxazosin  (CARDURA ) 4 MG tablet TAKE ONE-HALF TABLET BY MOUTH  DAILY 50 tablet 2 Past Week   fish oil-omega-3 fatty acids  1000 MG capsule Take 1,000 mg by mouth daily.   Past Week   lisinopril  (ZESTRIL ) 40 MG tablet TAKE 1 TABLET BY MOUTH DAILY 100 tablet 2 Past Week   Multiple Vitamin (MULTIVITAMIN) tablet Take 1 tablet by mouth daily.   Past Week   pantoprazole  (PROTONIX ) 40 MG tablet Take 1 tablet (40 mg total) by mouth daily. 30 tablet 0 Past Week   polyethylene glycol (GOLYTELY ) 236 g solution 5pm the evening before, mix very well and drink 8 ounces every 20 minutes until (1/2) of the liquid has been completed On the day of procedure, 5 hours before, drink 8 ounces every 15 minutes until complete 4000 mL 0  12/01/2023   pravastatin  (PRAVACHOL ) 40 MG tablet Take 1 tablet (40 mg total) by mouth daily. 09/29/23-PT NEEDS APPOINTMENT 30 tablet 0 Past Month   solifenacin  (VESICARE ) 10 MG tablet Take 1 tablet (10 mg total) by mouth daily. 90 tablet 3 Past Week   traZODone  (DESYREL ) 50 MG tablet TAKE 1 TABLET BY MOUTH AT  BEDTIME 90 tablet 3 Past Week   BREZTRI  AEROSPHERE 160-9-4.8 MCG/ACT AERO USE 2 INHALATIONS BY MOUTH INTO  THE LUNGS IN THE MORNING AND AT  BEDTIME (Patient not taking: Reported on 11/14/2023) 32.1 g 3 Not Taking   feeding supplement (ENSURE SURGERY) LIQD Take 237 mLs by mouth 2 (two) times daily between meals.      Pertinent Medications  No prior anticoag noted  Assessment: DM is a 83 yo female who presented to the ED due to a syncopal episode. Patient was found to have multiple submental and  subsegmental pulmonary emboli. Pharmacy has been consulted to dose heparin  in this 83 year old female admitted with PE.   CrCl = 37.2 ml/min   Baseline Labs Hgb 9.2 Plt 222 INR/PT - not collected aPTT - not collected   Goal of Therapy:  Heparin  level 0.3-0.7 units/ml Monitor platelets by anticoagulation protocol: Yes  Heparin  Levels Date/Time HL Assessment  04/25@1546  0.83 SUPRAtherapeutic 04/26@0240  0.68 Therapeutic x 1 04/26@1037  0.50 Therapeutic x 2  Plan:  -- Heparin  is therapeutic x 2 -- Continue heparin  infusion at 1,200 units/hour -- Recheck the next heparin  level with AM labs -- Continue to follow CBC daily   Thank you for allowing pharmacy to participate in this patient's care.   Craven Do, PharmD Pharmacy Resident  12/09/2023 11:37 AM

## 2023-12-10 DIAGNOSIS — I2699 Other pulmonary embolism without acute cor pulmonale: Secondary | ICD-10-CM | POA: Diagnosis not present

## 2023-12-10 LAB — BASIC METABOLIC PANEL WITH GFR
Anion gap: 9 (ref 5–15)
BUN: 21 mg/dL (ref 8–23)
CO2: 23 mmol/L (ref 22–32)
Calcium: 8.4 mg/dL — ABNORMAL LOW (ref 8.9–10.3)
Chloride: 108 mmol/L (ref 98–111)
Creatinine, Ser: 1 mg/dL (ref 0.44–1.00)
GFR, Estimated: 56 mL/min — ABNORMAL LOW (ref >60)
Glucose, Bld: 79 mg/dL (ref 70–99)
Potassium: 3.6 mmol/L (ref 3.5–5.1)
Sodium: 140 mmol/L (ref 135–145)

## 2023-12-10 LAB — CBC WITH DIFFERENTIAL/PLATELET
Abs Immature Granulocytes: 0.03 10*3/uL (ref 0.00–0.07)
Basophils Absolute: 0.1 10*3/uL (ref 0.0–0.1)
Basophils Relative: 2 %
Eosinophils Absolute: 0.6 10*3/uL — ABNORMAL HIGH (ref 0.0–0.5)
Eosinophils Relative: 11 %
HCT: 27.4 % — ABNORMAL LOW (ref 36.0–46.0)
Hemoglobin: 8.4 g/dL — ABNORMAL LOW (ref 12.0–15.0)
Immature Granulocytes: 1 %
Lymphocytes Relative: 25 %
Lymphs Abs: 1.5 10*3/uL (ref 0.7–4.0)
MCH: 25.2 pg — ABNORMAL LOW (ref 26.0–34.0)
MCHC: 30.7 g/dL (ref 30.0–36.0)
MCV: 82.3 fL (ref 80.0–100.0)
Monocytes Absolute: 0.6 10*3/uL (ref 0.1–1.0)
Monocytes Relative: 11 %
Neutro Abs: 3 10*3/uL (ref 1.7–7.7)
Neutrophils Relative %: 50 %
Platelets: 189 10*3/uL (ref 150–400)
RBC: 3.33 MIL/uL — ABNORMAL LOW (ref 3.87–5.11)
RDW: 21.1 % — ABNORMAL HIGH (ref 11.5–15.5)
Smear Review: NORMAL
WBC: 5.9 10*3/uL (ref 4.0–10.5)
nRBC: 0 % (ref 0.0–0.2)

## 2023-12-10 LAB — GLUCOSE, CAPILLARY: Glucose-Capillary: 80 mg/dL (ref 70–99)

## 2023-12-10 LAB — HEPARIN LEVEL (UNFRACTIONATED): Heparin Unfractionated: 0.5 [IU]/mL (ref 0.30–0.70)

## 2023-12-10 NOTE — Progress Notes (Signed)
 Progress Note   [name] WUJ:[id number] DOB: 25-Feb-1941 DOA: 12/01/2023     8 DOS: the patient was seen and examined on 12/10/2023     Brief hospital course: HPI on admission by Dr. Vallarie Gauze:  " Virginia Walters is a 83 y.o. female with medical history significant for HTN, HLD, COPD, HFpEF( EF 55-60% 11/16/2023), alcohol use disorder, chronic back pain, neuropathy, recently admitted from 4/1 to 4/4 with symptomatic anemia requiring 3 units PRBCs, complicated by acute encephalopathy attributed to delirium versus alcohol abuse versus baseline dementia, referred for outpatient GI workup and IV iron  with hematology, with good being admitted with a syncopal episode that occurred during the course of a colonoscopy prep. Tomorrow.  Patient was found sitting outside confused.  Per triage note, there was evidence of a trail of fecal incontinence from where she was sitting to inside the house and within the house.  Patient is currently oriented to person and place and is not sure about the circumstances but states she must have passed out. ED course and data review:Vitals within normal limits. Labs notable for AKI with creatinine 3.28 up from 1.03 a couple weeks prior. Hemoglobin 10.5 up from 9.3 a couple weeks aago. K+ 2.8 WBC and lactic acid normal Etoh <10 EKG with NSR at 67 CT head non acute "   Patient was admitted for further evaluation and management of acute metabolic encephalopathy and question of possible syncopal episode and AKI.  GI was consulted on admission, they do not plan for endoscopy before Monday, if acute issues are improved.    Further hospital course and management as outlined below.   4/19 -- Not UA appears grossly positive for UTI.  Started empiric IV Rocephin  and added on a urine culture.  Pt more alert but remains very confused, oriented only to self. 4/20 -- mental status improved somewhat, baseline unclear.  Cr improving.  Urine culture +E coli, remains on  empiric Rocephin .  No plan for endoscopy tomorrow per GI, needs further clinical improvement before bowel prep for procedure. 4/21 -- changed to PO Keflex .   4/22 -- GI started bowel prep, endoscopies tomorrow     Assessment and Plan:   Acute hypoxic respiratory failure secondary to multiple pulmonary embolism This morning patient developed worsening respiratory function and CT scan of the chest obtained showed extensive pulmonary embolism.  Plan of care discussed with vascular surgery team Patient being planned for possible thrombectomy next week Continue heparin  drip for now     UTI - POA.  Not septic. 4/20 -- urine culture grew pan-sensitive E.coli Has completed 5 days course of antibiotic therapy   Possible Syncope - in setting of pt doing bowel prep Acute metabolic encephalopathy - due to UTI, as well as possible alcohol use CT head non-acute. No focal neurologic deficits. History from [name], patient's cousin - no history of dementia - pt lives independently, drives & manages ADL's etc without assistance.  --Empiric high dose IV thiamine  500 mg IV TID for 5 days course Continue to follow-up on thiamine  levels --Consider MRI brain Monitor CBC closely --Neurologic checks with fall and aspiration precautions Continue to monitor on delirium precaution   AKI (acute kidney injury)  Baseline Cr 1.0-1.1. Admitted with Cr 3.28 with pre-renal azotemia Monitor renal function closely   Dementia,  Continue delirium precaution   Symptomatic anemia Presumed GI.  Had recent transfusion of 3 units PRBCs and Venofer  -Status post EGD, colonoscopy and complaints capsule endoscopy Continue current diet Monitor  hemoglobin closely and transfuse as needed to maintain hemoglobin greater than 8   COPD (chronic obstructive pulmonary disease) (HCC) Not acutely exacerbated Continue as needed nebulization   Chronic systolic heart failure (HCC) EF 55 to 60% with G1 DD.  History of systolic heart  failure Continue lisinopril  as renal function is improved   Alcohol use disorder EtOH level undetectable on admission Pt's cousin reports history of drinking wine 4-5+ glasses per day when she is drinking, but looked around pt's home and garbage cans and did not see evidence of recent drinking. --Cancelled CIWA protocol   HTN (hypertension) Continue lisinopril  40 mg daily Continue as needed hydralazine        Subjective:  Patient seen and examined at bedside this morning Vascular surgeon has seen patient and planning possible procedure tomorrow Patient will be kept n.p.o. after midnight   Physical Exam: General exam: awake, alert, no acute distress, obese, pleasantly confused HEENT: moist mucus membranes, hearing grossly normal  Respiratory system: expiratory wheezes, normal respiratory effort on room air. Cardiovascular system: RRR,no peripheral edema, normal S1/S2 Gastrointestinal system: soft, NT, ND Central nervous system: A&O x self and hospital, not year or situation. no gross focal neurologic deficits, normal speech Extremities: moves all, no edema, normal tone Psychiatry: normal mood, congruent affect, confused, abnormal judgment and insight     Family Communication: [name] was updated in detail by phone this afternoon.     Disposition: Status is: Inpatient Remains inpatient appropriate because: --SNF placement recommended.      Planned Discharge Destination: PT has recommended skilled nursing facility placement     Time spent:    Data Reviewed:    Vitals:   12/09/23 1917 12/09/23 2117 12/10/23 0422 12/10/23 1550  BP:  (!) 157/69 (!) 153/75 (!) 160/67  Pulse:  82 78 69  Resp:  20 20 20   Temp:  98.1 F (36.7 C) 98.5 F (36.9 C) 98.8 F (37.1 C)  TempSrc:  Oral  Oral  SpO2: 95% 95% 97% 100%  Weight:      Height:          Latest Ref Rng & Units 12/10/2023    4:54 AM 12/09/2023    5:00 AM 12/08/2023    4:11 AM  CBC  WBC 4.0  - 10.5 K/uL 5.9  9.6  7.5   Hemoglobin 12.0 - 15.0 g/dL 8.4  8.8  9.2   Hematocrit 36.0 - 46.0 % 27.4  27.8  30.2   Platelets 150 - 400 K/uL 189  194  206        Latest Ref Rng & Units 12/10/2023    4:54 AM 12/09/2023    5:00 AM 12/08/2023    4:11 AM  BMP  Glucose 70 - 99 mg/dL 79  94  161   BUN 8 - 23 mg/dL 21  26  22    Creatinine 0.44 - 1.00 mg/dL 0.96  0.45  4.09   Sodium 135 - 145 mmol/L 140  141  140   Potassium 3.5 - 5.1 mmol/L 3.6  3.9  3.7   Chloride 98 - 111 mmol/L 108  111  111   CO2 22 - 32 mmol/L 23  23  22    Calcium  8.9 - 10.3 mg/dL 8.4  8.4  8.5      Author: Ezzard Holms, MD 12/10/2023 4:22 PM  For on call review [email or web address].

## 2023-12-10 NOTE — Progress Notes (Signed)
 Emergency contact [name] would like to be contacted when a time is determined for patient's procedure tomorrow. Dolly's phone number is [phone]

## 2023-12-10 NOTE — TOC Progression Note (Signed)
 Transition of Care [facility]) - Progression Note    Patient Details  Name: Virginia Walters MRN: [id number] Date of Birth: [birth year]  [facility] CM/SW Contact  Areta Beer, RN Phone Number: 12/10/2023, 10:31 AM  Clinical Narrative: 4/27: All clinicals and signed FL2, PASRR notes uploaded to [facility] MUST for ID# [id number] at 1030 am 12/10/23.    Katheryn Pandy MSN RN CM  RN Case Manager [place]  Transitions of Care Direct Dial: [phone] (Weekends Only) [facility] Main Office Phone: [phone] [facility] Fax: [phone] [email or web address]          Expected Discharge Plan and Services                                               Social Determinants of Health (SDOH) Interventions SDOH Screenings   Food Insecurity: No Food Insecurity (12/02/2023)  Housing: Low Risk  (12/02/2023)  Transportation Needs: No Transportation Needs (12/02/2023)  Utilities: Not At Risk (12/02/2023)  Alcohol Screen: Low Risk  (05/25/2022)  Depression (PHQ2-9): Low Risk  (11/13/2023)  Financial Resource Strain: Low Risk  (05/25/2022)  Physical Activity: Inactive (05/25/2022)  Social Connections: Moderately Integrated (12/02/2023)  Stress: No Stress Concern Present (05/25/2022)  Tobacco Use: Medium Risk (12/06/2023)    Readmission Risk Interventions    09/20/2021   12:40 PM  Readmission Risk Prevention Plan  Transportation Screening Complete  PCP or Specialist Appt within 5-7 Days Complete  Home Care Screening Complete  Medication Review (RN CM) Complete

## 2023-12-10 NOTE — Progress Notes (Signed)
 PHARMACY - ANTICOAGULATION CONSULT NOTE  Pharmacy Consult for Heparin   Indication: pulmonary embolus  Allergies  Allergen Reactions   Ibuprofen     Oxycodone  Itching   Patient Measurements: Height: 5\' 7"  (170.2 cm) Weight: 102.9 kg (226 lb 13.7 oz) IBW/kg (Calculated) : 61.6 HEPARIN  DW (KG): 84.8  Vital Signs: Temp: 98.5 F (36.9 C) (04/27 0422) Temp Source: Oral (04/26 2117) BP: 153/75 (04/27 0422) Pulse Rate: 78 (04/27 0422)  Labs: Recent Labs    12/08/23 0411 12/08/23 1546 12/09/23 0240 12/09/23 0500 12/09/23 1037 12/10/23 0454  HGB 9.2*  --   --  8.8*  --  8.4*  HCT 30.2*  --   --  27.8*  --  27.4*  PLT 206  --   --  194  --  189  HEPARINUNFRC  --    < > 0.68  --  0.50 0.50  CREATININE 1.43*  --   --  1.14*  --  1.00   < > = values in this interval not displayed.   Estimated Creatinine Clearance: 53.5 mL/min (by C-G formula based on SCr of 1 mg/dL).  Medical History: Past Medical History:  Diagnosis Date   (HFpEF) heart failure with preserved ejection fraction (HCC)    a.) TTE 07/22/2018: EF 45-50%; mild LVH; mild diffuse HK; mild LA dilation; PASP 30-35 mmHg; G1DD.   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   Past Week   dorzolamide  (TRUSOPT ) 2 % ophthalmic solution Place 1 drop into the left eye 2 (two) times daily.   Past Week   doxazosin  (CARDURA ) 4 MG tablet TAKE ONE-HALF TABLET BY MOUTH  DAILY 50 tablet 2 Past Week   fish oil-omega-3 fatty acids  1000 MG capsule Take 1,000 mg by mouth daily.   Past Week   lisinopril  (ZESTRIL ) 40 MG tablet TAKE 1 TABLET BY MOUTH DAILY 100 tablet 2 Past Week   Multiple Vitamin (MULTIVITAMIN) tablet Take 1 tablet by mouth daily.   Past Week   pantoprazole  (PROTONIX ) 40 MG tablet Take 1 tablet (40 mg total) by mouth daily. 30 tablet 0 Past Week   polyethylene glycol (GOLYTELY ) 236 g solution 5pm the evening before, mix very well and drink 8 ounces every 20 minutes until (1/2)  of the liquid has been completed On the day of procedure, 5 hours before, drink 8 ounces every 15 minutes until complete 4000 mL 0 12/01/2023   pravastatin  (PRAVACHOL ) 40 MG tablet Take 1 tablet (40 mg total) by mouth daily. 09/29/23-PT NEEDS APPOINTMENT 30 tablet 0 Past Month   solifenacin  (VESICARE ) 10 MG tablet Take 1 tablet (10 mg total) by mouth daily. 90 tablet 3 Past Week   traZODone  (DESYREL ) 50 MG tablet TAKE 1 TABLET BY MOUTH AT  BEDTIME 90 tablet 3 Past Week   BREZTRI  AEROSPHERE 160-9-4.8 MCG/ACT AERO USE 2 INHALATIONS BY MOUTH INTO  THE LUNGS IN THE MORNING AND AT  BEDTIME (Patient not taking: Reported on 11/14/2023) 32.1 g 3 Not Taking   feeding supplement (ENSURE SURGERY) LIQD Take 237 mLs by mouth 2 (two) times daily between meals.      Pertinent Medications  No prior anticoag noted  Assessment: Virginia Walters is a 83 yo female who presented to the ED due to a  syncopal episode. Patient was found to have multiple submental and subsegmental pulmonary emboli. Pharmacy has been consulted to dose heparin  in this 83 year old female admitted with PE.   CrCl = 37.2 ml/min   Baseline Labs Hgb 9.2 Plt 222 INR/PT - not collected aPTT - not collected   Goal of Therapy:  Heparin  level 0.3-0.7 units/ml Monitor platelets by anticoagulation protocol: Yes  Heparin  Levels Date/Time HL Assessment  04/25@1546  0.83 SUPRAtherapeutic 04/26@0240  0.68 Therapeutic x 1 04/26@1037  0.50 Therapeutic x 2 04/27@0454    0.50     Therapeutic X 3   Plan:  -- Heparin  is therapeutic x 3 -- Continue heparin  infusion at 1,200 units/hour -- Recheck the next heparin  level with AM labs -- Continue to follow CBC daily   Thank you for allowing pharmacy to participate in this patient's care.   Hendrick Pavich D, PharmD 12/10/2023 5:59 AM

## 2023-12-10 NOTE — Progress Notes (Signed)
 4 Days Post-Op   Subjective/Chief Complaint: Remains confused. Remains dyspneic on O2.   Objective: Vital signs in last 24 hours: Temp:  [98.1 F (36.7 C)-98.5 F (36.9 C)] 98.5 F (36.9 C) (04/27 0422) Pulse Rate:  [78-82] 78 (04/27 0422) Resp:  [20] 20 (04/27 0422) BP: (153-157)/(69-75) 153/75 (04/27 0422) SpO2:  [95 %-97 %] 97 % (04/27 0422) FiO2 (%):  [28 %] 28 % (04/26 1917) Last BM Date : 12/09/23  Intake/Output from previous day: 04/26 0701 - 04/27 0700 In: 3165.1 [P.O.:240; I.V.:2705.1; IV Piggyback:220] Out: -  Intake/Output this shift: No intake/output data recorded.  General appearance: no distress Resp: wheezes bilaterally  Lab Results:  Recent Labs    12/09/23 0500 12/10/23 0454  WBC 9.6 5.9  HGB 8.8* 8.4*  HCT 27.8* 27.4*  PLT 194 189   BMET Recent Labs    12/09/23 0500 12/10/23 0454  NA 141 140  K 3.9 3.6  CL 111 108  CO2 23 23  GLUCOSE 94 79  BUN 26* 21  CREATININE 1.14* 1.00  CALCIUM  8.4* 8.4*   PT/INR No results for input(s): "LABPROT", "INR" in the last 72 hours. ABG Recent Labs    12/08/23 0411  HCO3 26.0    Studies/Results: No results found.  Anti-infectives: Anti-infectives (From admission, onward)    Start     Dose/Rate Route Frequency Ordered Stop   12/08/23 1423  ceFAZolin  (ANCEF ) IVPB 2g/100 mL premix        2 g 200 mL/hr over 30 Minutes Intravenous 30 min pre-op 12/08/23 1423     12/06/23 2000  cephALEXin  (KEFLEX ) capsule 500 mg        500 mg Oral Every 8 hours 12/06/23 1732 12/07/23 0522   12/05/23 0600  cephALEXin  (KEFLEX ) capsule 500 mg  Status:  Discontinued        500 mg Oral Every 8 hours 12/04/23 1004 12/06/23 1732   12/02/23 0900  cefTRIAXone  (ROCEPHIN ) 1 g in sodium chloride  0.9 % 100 mL IVPB  Status:  Discontinued        1 g 200 mL/hr over 30 Minutes Intravenous Every 24 hours 12/02/23 0806 12/04/23 1004       Assessment/Plan: Plan for RIGHT pulmonary thrombectomy tomorrow per Dr.  Prescilla Brod. Previously discussed with family. NPO after MN  LOS: 8 days    Virginia Walters A 12/10/2023

## 2023-12-11 ENCOUNTER — Encounter
Admission: EM | Disposition: A | Discharge status: Skilled Nursing Facility | Payer: Self-pay | Source: Home / Self Care | Attending: Internal Medicine

## 2023-12-11 DIAGNOSIS — I2699 Other pulmonary embolism without acute cor pulmonale: Secondary | ICD-10-CM

## 2023-12-11 DIAGNOSIS — I723 Aneurysm of iliac artery: Secondary | ICD-10-CM

## 2023-12-11 HISTORY — PX: PULMONARY THROMBECTOMY: CATH118295

## 2023-12-11 LAB — CBC WITH DIFFERENTIAL/PLATELET
Abs Immature Granulocytes: 0.04 10*3/uL (ref 0.00–0.07)
Basophils Absolute: 0.1 10*3/uL (ref 0.0–0.1)
Basophils Relative: 1 %
Eosinophils Absolute: 0.8 10*3/uL — ABNORMAL HIGH (ref 0.0–0.5)
Eosinophils Relative: 12 %
HCT: 29.7 % — ABNORMAL LOW (ref 36.0–46.0)
Hemoglobin: 8.9 g/dL — ABNORMAL LOW (ref 12.0–15.0)
Immature Granulocytes: 1 %
Lymphocytes Relative: 21 %
Lymphs Abs: 1.5 10*3/uL (ref 0.7–4.0)
MCH: 24.9 pg — ABNORMAL LOW (ref 26.0–34.0)
MCHC: 30 g/dL (ref 30.0–36.0)
MCV: 83 fL (ref 80.0–100.0)
Monocytes Absolute: 0.7 10*3/uL (ref 0.1–1.0)
Monocytes Relative: 11 %
Neutro Abs: 3.8 10*3/uL (ref 1.7–7.7)
Neutrophils Relative %: 54 %
Platelets: 194 10*3/uL (ref 150–400)
RBC: 3.58 MIL/uL — ABNORMAL LOW (ref 3.87–5.11)
RDW: 20.9 % — ABNORMAL HIGH (ref 11.5–15.5)
WBC: 7 10*3/uL (ref 4.0–10.5)
nRBC: 0 % (ref 0.0–0.2)

## 2023-12-11 LAB — BASIC METABOLIC PANEL WITH GFR
Anion gap: 5 (ref 5–15)
BUN: 17 mg/dL (ref 8–23)
CO2: 24 mmol/L (ref 22–32)
Calcium: 8.6 mg/dL — ABNORMAL LOW (ref 8.9–10.3)
Chloride: 112 mmol/L — ABNORMAL HIGH (ref 98–111)
Creatinine, Ser: 1.09 mg/dL — ABNORMAL HIGH (ref 0.44–1.00)
GFR, Estimated: 51 mL/min — ABNORMAL LOW (ref >60)
Glucose, Bld: 92 mg/dL (ref 70–99)
Potassium: 3.7 mmol/L (ref 3.5–5.1)
Sodium: 141 mmol/L (ref 135–145)

## 2023-12-11 LAB — HEPARIN LEVEL (UNFRACTIONATED): Heparin Unfractionated: 0.4 [IU]/mL (ref 0.30–0.70)

## 2023-12-11 SURGERY — PULMONARY THROMBECTOMY
Anesthesia: Moderate Sedation

## 2023-12-11 MED ORDER — CEFAZOLIN SODIUM-DEXTROSE 2-4 GM/100ML-% IV SOLN
INTRAVENOUS | Status: AC
  Filled 2023-12-11: qty 100

## 2023-12-11 MED ORDER — LIDOCAINE HCL (PF) 1 % IJ SOLN
INTRAMUSCULAR | Status: DC | PRN
  Administered 2023-12-11: 5 mL

## 2023-12-11 MED ORDER — HEPARIN (PORCINE) 25000 UT/250ML-% IV SOLN
1200.0000 [IU]/h | INTRAVENOUS | Status: DC
  Administered 2023-12-11 – 2023-12-12 (×2): 1200 [IU]/h via INTRAVENOUS
  Filled 2023-12-11 (×2): qty 250

## 2023-12-11 MED ORDER — HEPARIN SODIUM (PORCINE) 1000 UNIT/ML IJ SOLN
INTRAMUSCULAR | Status: AC
  Filled 2023-12-11: qty 10

## 2023-12-11 MED ORDER — MIDAZOLAM HCL 2 MG/2ML IJ SOLN
INTRAMUSCULAR | Status: AC
  Filled 2023-12-11: qty 4

## 2023-12-11 MED ORDER — HEPARIN SODIUM (PORCINE) 1000 UNIT/ML IJ SOLN
INTRAMUSCULAR | Status: DC | PRN
  Administered 2023-12-11: 4000 [IU] via INTRAVENOUS

## 2023-12-11 MED ORDER — FENTANYL CITRATE (PF) 100 MCG/2ML IJ SOLN
INTRAMUSCULAR | Status: DC | PRN
  Administered 2023-12-11: 50 ug via INTRAVENOUS

## 2023-12-11 MED ORDER — MIDAZOLAM HCL 2 MG/2ML IJ SOLN
INTRAMUSCULAR | Status: DC | PRN
  Administered 2023-12-11: 1 mg via INTRAVENOUS

## 2023-12-11 MED ORDER — SODIUM CHLORIDE 0.9 % IV SOLN: INTRAVENOUS | Status: DC

## 2023-12-11 MED ORDER — FENTANYL CITRATE (PF) 100 MCG/2ML IJ SOLN
INTRAMUSCULAR | Status: AC
  Filled 2023-12-11: qty 2

## 2023-12-11 SURGICAL SUPPLY — 20 items
CANISTER PENUMBRA ENGINE (MISCELLANEOUS) IMPLANT
CATH ANGIO 5F PIGTAIL 100CM (CATHETERS) IMPLANT
CATH INDIGO SEP 8 (CATHETERS) IMPLANT
CATH LIGHTNING 8 XTORQ 115 (CATHETERS) IMPLANT
CATH SELECT BERN TIP 5F 130 (CATHETERS) IMPLANT
CLOSURE PERCLOSE PROSTYLE (VASCULAR PRODUCTS) IMPLANT
DEVICE TORQUE (MISCELLANEOUS) IMPLANT
GLIDEWIRE ANGLED SS 035X260CM (WIRE) IMPLANT
NDL ENTRY 21GA 7CM ECHOTIP (NEEDLE) IMPLANT
NEEDLE ENTRY 21GA 7CM ECHOTIP (NEEDLE) ×1 IMPLANT
PACK ANGIOGRAPHY (CUSTOM PROCEDURE TRAY) ×1 IMPLANT
SET INTRO CAPELLA COAXIAL (SET/KITS/TRAYS/PACK) IMPLANT
SHEATH 9FRX11 (SHEATH) IMPLANT
SHEATH BRITE TIP 6FRX11 (SHEATH) IMPLANT
SUT MNCRL AB 4-0 PS2 18 (SUTURE) IMPLANT
SUT SILK 0 FSL (SUTURE) IMPLANT
SYR MEDRAD MARK 7 150ML (SYRINGE) IMPLANT
TUBING CONTRAST HIGH PRESS 72 (TUBING) IMPLANT
WIRE AMPLATZ SSTIFF .035X260CM (WIRE) IMPLANT
WIRE J 3MM .035X145CM (WIRE) IMPLANT

## 2023-12-11 NOTE — Plan of Care (Signed)
  Problem: Education: Goal: Knowledge of condition and prescribed therapy will improve Outcome: Progressing   Problem: Education: Goal: Knowledge of General Education information will improve Description: Including pain rating scale, medication(s)/side effects and non-pharmacologic comfort measures Outcome: Progressing   Problem: Health Behavior/Discharge Planning: Goal: Ability to manage health-related needs will improve Outcome: Progressing

## 2023-12-11 NOTE — TOC PASRR Note (Signed)
 RE: Virginia Walters Date of Birth: August 15, 2041 Date: 12/08/23      To Whom It May Concern:   Please be advised that the above-named patient will require a short-term nursing home stay - anticipated 30 days or less for rehabilitation and strengthening.  The plan is for return home

## 2023-12-11 NOTE — TOC Progression Note (Signed)
 Transition of Care [facility]) - Progression Note    Patient Details  Name: Virginia Walters MRN: [id number] Date of Birth: [birth year]  [facility] CM/SW Contact  Loman Risk, RN Phone Number: 12/11/2023, 3:32 PM  Clinical Narrative:           PASRR received [id number] E valid through 01/10/24  Expected Discharge Plan and Services                                               Social Determinants of Health (SDOH) Interventions SDOH Screenings   Food Insecurity: No Food Insecurity (12/02/2023)  Housing: Low Risk  (12/02/2023)  Transportation Needs: No Transportation Needs (12/02/2023)  Utilities: Not At Risk (12/02/2023)  Alcohol Screen: Low Risk  (05/25/2022)  Depression (PHQ2-9): Low Risk  (11/13/2023)  Financial Resource Strain: Low Risk  (05/25/2022)  Physical Activity: Inactive (05/25/2022)  Social Connections: Moderately Integrated (12/02/2023)  Stress: No Stress Concern Present (05/25/2022)  Tobacco Use: Medium Risk (12/06/2023)    Readmission Risk Interventions    09/20/2021   12:40 PM  Readmission Risk Prevention Plan  Transportation Screening Complete  PCP or Specialist Appt within 5-7 Days Complete  Home Care Screening Complete  Medication Review (RN CM) Complete

## 2023-12-11 NOTE — Progress Notes (Signed)
 Progress Note   [name] [id number] DOB: 1940-10-01 DOA: 12/01/2023     9 DOS: the patient was seen and examined on 12/11/2023   Brief hospital course:  From HPI "Virginia Walters is a 83 y.o. female with medical history significant for HTN, HLD, COPD, HFpEF( EF 55-60% 11/16/2023), alcohol use disorder, chronic back pain, neuropathy, recently admitted from 4/1 to 4/4 with symptomatic anemia requiring 3 units PRBCs, complicated by acute encephalopathy attributed to delirium versus alcohol abuse versus baseline dementia, referred for outpatient GI workup and IV iron  with hematology, with good being admitted with a syncopal episode that occurred during the course of a colonoscopy prep. Tomorrow.  Patient was found sitting outside confused.  Per triage note, there was evidence of a trail of fecal incontinence from where she was sitting to inside the house and within the house.  Patient is currently oriented to person and place and is not sure about the circumstances but states she must have passed out. Patient was admitted for further evaluation and management of acute metabolic encephalopathy and question of possible syncopal episode and AKI.  GI was consulted on admission, they do not plan for endoscopy.     Assessment and Plan:   Acute hypoxic respiratory failure secondary to multiple pulmonary embolism 12/08/2023 patient developed worsening respiratory function and CT scan of the chest obtained showed extensive pulmonary embolism.  Plan of care discussed with vascular surgery team Patient being planned for possible thrombectomy today Continue heparin  drip for now     UTI - POA.  Not septic. 4/20 -- urine culture grew pan-sensitive E.coli Has completed 5 days course of antibiotic therapy   Possible Syncope - in setting of pt doing bowel prep Acute metabolic encephalopathy - due to UTI, as well as possible alcohol use CT head non-acute. No focal neurologic  deficits. Mental status have improved since admission Patient has completed 5 days course of high-dose IV thiamine  Thiamine  level 86.4 which is normal Monitor CBC closely Continue PT OT   AKI (acute kidney injury)-improved Baseline Cr 1.0-1.1. Admitted with Cr 3.28 with pre-renal azotemia Monitor renal function closely   Dementia,  Continue delirium precaution   Symptomatic anemia Presumed GI.  Had recent transfusion of 3 units PRBCs and Venofer  -Status post EGD, colonoscopy and complaints capsule endoscopy Continue current diet Monitor hemoglobin closely and transfuse as needed to maintain hemoglobin greater than 8   COPD (chronic obstructive pulmonary disease) (HCC) Not acutely exacerbated Continue as needed nebulization   Chronic systolic heart failure (HCC) EF 55 to 60% with G1 DD.  History of systolic heart failure Continue lisinopril  as renal function is improved   Alcohol use disorder EtOH level undetectable on admission Pt's cousin reports history of drinking wine 4-5+ glasses per day when she is drinking, but looked around pt's home and garbage cans and did not see evidence of recent drinking. --Cancelled CIWA protocol   HTN (hypertension) Continue lisinopril  40 mg daily Continue as needed hydralazine        Subjective:  Patient seen and examined at bedside this morning Patient being planned for thrombectomy today by vascular surgery Still requiring 3 L of oxygen    Physical Exam: General exam: awake, alert, no acute distress, obese, mental status improved HEENT: moist mucus membranes, hearing grossly normal  Respiratory system: expiratory wheezes, normal respiratory effort on room air. Cardiovascular system: RRR,no peripheral edema, normal S1/S2 Gastrointestinal system: soft, NT, ND Central nervous system: Patient is alert and oriented x 3 but lethargic  Extremities: moves all, no edema, normal tone Psychiatry: normal mood, congruent affect, confusion  improved   Family Communication: Patient's daughter was updated at bedside     Disposition: Status is: Inpatient Remains inpatient appropriate because: --SNF placement recommended.      Planned Discharge Destination: PT has recommended skilled nursing facility placement     Time spent: 39 minutes    Data Reviewed:    Latest Ref Rng & Units 12/11/2023    5:49 AM 12/10/2023    4:54 AM 12/09/2023    5:00 AM  CBC  WBC 4.0 - 10.5 K/uL 7.0  5.9  9.6   Hemoglobin 12.0 - 15.0 g/dL 8.9  8.4  8.8   Hematocrit 36.0 - 46.0 % 29.7  27.4  27.8   Platelets 150 - 400 K/uL 194  189  194        Latest Ref Rng & Units 12/11/2023    5:49 AM 12/10/2023    4:54 AM 12/09/2023    5:00 AM  BMP  Glucose 70 - 99 mg/dL 92  79  94   BUN 8 - 23 mg/dL 17  21  26    Creatinine 0.44 - 1.00 mg/dL 4.09  8.11  9.14   Sodium 135 - 145 mmol/L 141  140  141   Potassium 3.5 - 5.1 mmol/L 3.7  3.6  3.9   Chloride 98 - 111 mmol/L 112  108  111   CO2 22 - 32 mmol/L 24  23  23    Calcium  8.9 - 10.3 mg/dL 8.6  8.4  8.4     Vitals:   12/11/23 0448 12/11/23 0735 12/11/23 1457 12/11/23 1725  BP:  (!) 153/69 (!) 151/81   Pulse:  79 73 78  Resp:  20 20 20   Temp:  97.9 F (36.6 C) 98.3 F (36.8 C) 97.6 F (36.4 C)  TempSrc:  Oral Oral   SpO2: 94% 99% 98% 99%  Weight:      Height:         Author: Ezzard Holms, MD 12/11/2023 6:09 PM  For on call review [email or web address].

## 2023-12-11 NOTE — Progress Notes (Signed)
 PHARMACY - ANTICOAGULATION CONSULT NOTE  Pharmacy Consult for Heparin   Indication: pulmonary embolus  Allergies  Allergen Reactions   Ibuprofen     Oxycodone  Itching   Patient Measurements: Height: 5\' 7"  (170.2 cm) Weight: 104 kg (229 lb 4.5 oz) IBW/kg (Calculated) : 61.6 HEPARIN  DW (KG): 84.8  Vital Signs: Temp: 97.6 F (36.4 C) (04/28 0436) Temp Source: Oral (04/28 0436) BP: 171/57 (04/28 0436) Pulse Rate: 70 (04/28 0436)  Labs: Recent Labs    12/09/23 0500 12/09/23 1037 12/10/23 0454 12/11/23 0549  HGB 8.8*  --  8.4* 8.9*  HCT 27.8*  --  27.4* 29.7*  PLT 194  --  189 194  HEPARINUNFRC  --  0.50 0.50 0.40  CREATININE 1.14*  --  1.00 1.09*   Estimated Creatinine Clearance: 49.4 mL/min (A) (by C-G formula based on SCr of 1.09 mg/dL (H)).  Medical History: Past Medical History:  Diagnosis Date   (HFpEF) heart failure with preserved ejection fraction (HCC)    a.) TTE 07/22/2018: EF 45-50%; mild LVH; mild diffuse HK; mild LA dilation; PASP 30-35 mmHg; G1DD.   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   Past Week   dorzolamide  (TRUSOPT ) 2 % ophthalmic solution Place 1 drop into the left eye 2 (two) times daily.   Past Week   doxazosin  (CARDURA ) 4 MG tablet TAKE ONE-HALF TABLET BY MOUTH  DAILY 50 tablet 2 Past Week   fish oil-omega-3 fatty acids  1000 MG capsule Take 1,000 mg by mouth daily.   Past Week   lisinopril  (ZESTRIL ) 40 MG tablet TAKE 1 TABLET BY MOUTH DAILY 100 tablet 2 Past Week   Multiple Vitamin (MULTIVITAMIN) tablet Take 1 tablet by mouth daily.   Past Week   pantoprazole  (PROTONIX ) 40 MG tablet Take 1 tablet (40 mg total) by mouth daily. 30 tablet 0 Past Week   polyethylene glycol (GOLYTELY ) 236 g solution 5pm the evening before, mix very well and drink 8 ounces every 20 minutes until (1/2) of the liquid has been completed On the day of procedure, 5 hours before, drink 8 ounces every 15 minutes until complete 4000 mL 0 12/01/2023   pravastatin  (PRAVACHOL ) 40 MG tablet Take 1 tablet (40 mg total) by mouth daily. 09/29/23-PT NEEDS  APPOINTMENT 30 tablet 0 Past Month   solifenacin  (VESICARE ) 10 MG tablet Take 1 tablet (10 mg total) by mouth daily. 90 tablet 3 Past Week   traZODone  (DESYREL ) 50 MG tablet TAKE 1 TABLET BY MOUTH AT  BEDTIME 90 tablet 3 Past Week   BREZTRI  AEROSPHERE 160-9-4.8 MCG/ACT AERO USE 2 INHALATIONS BY MOUTH INTO  THE LUNGS IN THE MORNING AND AT  BEDTIME (Patient not taking: Reported on 11/14/2023) 32.1 g 3 Not Taking   feeding supplement (ENSURE SURGERY) LIQD Take 237 mLs by mouth 2 (two) times daily between meals.      Pertinent Medications  No prior anticoag noted  Assessment: DM is a 83 yo female who presented to the ED due to a syncopal episode. Patient was found to have multiple submental and subsegmental pulmonary emboli. Pharmacy has been  consulted to dose heparin  in this 83 year old female admitted with PE.   CrCl = 37.2 ml/min   Baseline Labs Hgb 9.2 Plt 222 INR/PT - not collected aPTT - not collected   Goal of Therapy:  Heparin  level 0.3-0.7 units/ml Monitor platelets by anticoagulation protocol: Yes  Heparin  Levels Date/Time HL Assessment  04/25@1546  0.83 SUPRAtherapeutic 04/26@0240  0.68 Therapeutic x 1 04/26@1037  0.50 Therapeutic x 2 04/27@0454    0.50     Therapeutic X 3  04/28@0544    0.40     Therapeutic X 4   Plan:  -- Heparin  is therapeutic x 4 -- Continue heparin  infusion at 1,200 units/hour -- Recheck the next heparin  level with AM labs -- Continue to follow CBC daily   Thank you for allowing pharmacy to participate in this patient's care.   Shamira Toutant D, PharmD 12/11/2023 6:43 AM

## 2023-12-11 NOTE — OR Nursing (Signed)
 Bladder scan 114 ml

## 2023-12-11 NOTE — OR Nursing (Signed)
 Post sheath removal, Sudden onset of abdominal pain radiating to back. Dr Prescilla Brod informed. Moved to bed and transferred to specials recovery. Pain "eased a bit after moving her to inpt bed. Abdomin and groin soft to palpation. But guarding groin site.

## 2023-12-11 NOTE — TOC Progression Note (Signed)
 Transition of Care [facility]) - Progression Note    Patient Details  Name: Virginia Walters MRN: [id number] Date of Birth: 06-05-1941  Transition of Care [facility]) CM/SW Contact  Loman Risk, RN Phone Number: 12/11/2023, 10:06 AM  Clinical Narrative:    30 day note sent to MD to sign        Expected Discharge Plan and Services                                               Social Determinants of Health (SDOH) Interventions SDOH Screenings   Food Insecurity: No Food Insecurity (12/02/2023)  Housing: Low Risk  (12/02/2023)  Transportation Needs: No Transportation Needs (12/02/2023)  Utilities: Not At Risk (12/02/2023)  Alcohol Screen: Low Risk  (05/25/2022)  Depression (PHQ2-9): Low Risk  (11/13/2023)  Financial Resource Strain: Low Risk  (05/25/2022)  Physical Activity: Inactive (05/25/2022)  Social Connections: Moderately Integrated (12/02/2023)  Stress: No Stress Concern Present (05/25/2022)  Tobacco Use: Medium Risk (12/06/2023)    Readmission Risk Interventions    09/20/2021   12:40 PM  Readmission Risk Prevention Plan  Transportation Screening Complete  PCP or Specialist Appt within 5-7 Days Complete  Home Care Screening Complete  Medication Review (RN CM) Complete

## 2023-12-11 NOTE — TOC Progression Note (Signed)
 Transition of Care [facility]) - Progression Note    Patient Details  Name: Virginia Walters MRN: [id number] Date of Birth: 1941/07/15  Transition of Care [facility]) CM/SW Contact  Loman Risk, RN Phone Number: 12/11/2023, 2:37 PM  Clinical Narrative:      Spoke with daughter [name].  Reviewed bed offers. She would like to see if [facility] or [facility] can review.  Message send to both of their admissions coordinator requesting to review Signed 30 day note uploaded to [facility]      Expected Discharge Plan and Services                                               Social Determinants of Health (SDOH) Interventions SDOH Screenings   Food Insecurity: No Food Insecurity (12/02/2023)  Housing: Low Risk  (12/02/2023)  Transportation Needs: No Transportation Needs (12/02/2023)  Utilities: Not At Risk (12/02/2023)  Alcohol Screen: Low Risk  (05/25/2022)  Depression (PHQ2-9): Low Risk  (11/13/2023)  Financial Resource Strain: Low Risk  (05/25/2022)  Physical Activity: Inactive (05/25/2022)  Social Connections: Moderately Integrated (12/02/2023)  Stress: No Stress Concern Present (05/25/2022)  Tobacco Use: Medium Risk (12/06/2023)    Readmission Risk Interventions    09/20/2021   12:40 PM  Readmission Risk Prevention Plan  Transportation Screening Complete  PCP or Specialist Appt within 5-7 Days Complete  Home Care Screening Complete  Medication Review (RN CM) Complete

## 2023-12-11 NOTE — Op Note (Signed)
 [place] [facility]  Percutaneous Study/Intervention Procedural Note   Date of Surgery: 12/11/2023,7:07 PM  Surgeon:Journe Hallmark, Ninette Basque   Pre-operative Diagnosis: Symptomatic pulmonary emboli with significant exertional hypoxia  Post-operative diagnosis:  Same  Procedure(s) Performed:  1.  Contrast injection right heart and bilateral pulmonary arteries  2.   Mechanical thrombectomy right lobar pulmonary arteries for removal of pulmonary emboli using the Penumbra CAT 8 catheter.  3.  Selective catheter placement right upper lobe pulmonary artery, middle lobe pulmonary artery and lower lobe pulmonary artery     Anesthesia: Conscious sedation was administered under my direct supervision by the interventional radiology RN. IV Versed  plus fentanyl  were utilized. Continuous ECG, pulse oximetry and blood pressure was monitored throughout the entire procedure.  Versed  and fentanyl  were administered intravenously.  Conscious sedation was administered for a total of 37 minutes.  Sheath: 9 French Pinnacle antegrade right common femoral vein  Contrast: 65 cc   Fluoroscopy Time: 10.4 minutes  Indications:  Patient presents with pulmonary emboli. The patient is symptomatic with hypoxemia and dyspnea on exertion.  There is evidence of right heart strain on the CT angiogram. The patient is otherwise a good candidate for intervention and even the long-term benefits pulmonary angiography with thrombolysis is offered. The risks and benefits are reviewed long-term benefits are discussed. All questions are answered patient agrees to proceed.  Procedure:  Virginia Walters a 83 y.o. female who was identified and appropriate procedural time out was performed.  The patient was then placed supine on the table and prepped and draped in the usual sterile fashion.  Ultrasound was used to evaluate the right common femoral vein.  It was patent, as it was echolucent and compressible.  A digital  ultrasound image was acquired for the permanent record.  A micropuncture needle was used to access the right common femoral vein under direct ultrasound guidance.  A microwire was then advanced under fluoroscopic guidance followed by micro-sheath.  A 0.035 J wire was advanced without resistance and a 5Fr sheath was placed.  Perclose devices were then used in a preclose fashion and then upsized to an 9 [place] sheath.    The wire and pigtail catheter were then negotiated into the right atrium and bolus injection of contrast was utilized to demonstrate the right ventricle and the pulmonary artery outflow.   4000 Units of heparin  was then given and allowed to circulate.  The J-wire and pigtail catheter was advanced up to the right atrium where a bolus injection contrast was used to demonstrate the pulmonary artery outflow.  Stiff angled Glidewire was then exchanged for the J-wire and the pigtail catheter was used to select the pulmonary outflow track.  The right main pulmonary artery was evaluated first.  A select catheter was then advanced over the Amplatz wire into the distal right main pulmonary artery and hand-injection confirmed the thrombus.  The Penumbra Cat 8 extra torque catheter was then advanced into the thrombus in the right lower lobe pulmonary artery.  Hand-injection contrast was used to verify positioning and evaluate the distal anatomy.  Mechanical aspiration was performed using the CAT 8 catheter and a separator.  After multiple passes the catheter was then repositioned into the right middle lobe pulmonary artery and again hand-injection contrast was performed to verify position and evaluate the distal anatomy.  Multiple passes were made using mechanical aspiration in association with a separator.  Lastly, using a combination of the separator catheter and Glidewire the CAT 8 device was  negotiated into the right upper lobe pulmonary artery.  Hand-injection of contrast was used to verify  positioning and evaluate the distal anatomy.  Multiple passes were then performed using the separator with the CAT 8 penumbra catheter.  Once there was free flow of blood from all 3 lobar arteries the catheter was repositioned to the right main pulmonary artery.  Hand-injection contrast was then used to give an assessment of the effectiveness of thrombectomy.  A bolus injection of contrast was then used to create a final image of the pulmonary vasculature.  After review these images the catheter and sheath were removed and pressure held. There were no immediate complications.    Findings:   Right heart imaging:  Right atrium and right ventricle and the pulmonary outflow tract appears normal  Right lung: The initial images of the right lung demonstrate thrombus within the distal right main pulmonary artery extending into the right upper, middle and lower lobar arteries.  There is thrombus extending into the segmental branches as well.  Following thrombectomy there appears to be near total resolution of the previously identified thrombus.     Disposition: Patient was taken to the recovery room in stable condition having tolerated the procedure well.  Virginia Walters 12/11/2023,7:07 PM

## 2023-12-12 ENCOUNTER — Encounter: Payer: Self-pay | Admitting: Vascular Surgery

## 2023-12-12 DIAGNOSIS — G9341 Metabolic encephalopathy: Secondary | ICD-10-CM | POA: Diagnosis not present

## 2023-12-12 DIAGNOSIS — I2699 Other pulmonary embolism without acute cor pulmonale: Secondary | ICD-10-CM | POA: Diagnosis not present

## 2023-12-12 LAB — CBC WITH DIFFERENTIAL/PLATELET
Abs Immature Granulocytes: 0.04 10*3/uL (ref 0.00–0.07)
Basophils Absolute: 0.1 10*3/uL (ref 0.0–0.1)
Basophils Relative: 1 %
Eosinophils Absolute: 0.8 10*3/uL — ABNORMAL HIGH (ref 0.0–0.5)
Eosinophils Relative: 12 %
HCT: 27 % — ABNORMAL LOW (ref 36.0–46.0)
Hemoglobin: 8.4 g/dL — ABNORMAL LOW (ref 12.0–15.0)
Immature Granulocytes: 1 %
Lymphocytes Relative: 16 %
Lymphs Abs: 1.1 10*3/uL (ref 0.7–4.0)
MCH: 25.5 pg — ABNORMAL LOW (ref 26.0–34.0)
MCHC: 31.1 g/dL (ref 30.0–36.0)
MCV: 82.1 fL (ref 80.0–100.0)
Monocytes Absolute: 0.6 10*3/uL (ref 0.1–1.0)
Monocytes Relative: 9 %
Neutro Abs: 4.2 10*3/uL (ref 1.7–7.7)
Neutrophils Relative %: 61 %
Platelets: 193 10*3/uL (ref 150–400)
RBC: 3.29 MIL/uL — ABNORMAL LOW (ref 3.87–5.11)
RDW: 21.6 % — ABNORMAL HIGH (ref 11.5–15.5)
Smear Review: NORMAL
WBC: 6.9 10*3/uL (ref 4.0–10.5)
nRBC: 0 % (ref 0.0–0.2)

## 2023-12-12 LAB — BASIC METABOLIC PANEL WITH GFR
Anion gap: 6 (ref 5–15)
BUN: 11 mg/dL (ref 8–23)
CO2: 24 mmol/L (ref 22–32)
Calcium: 9 mg/dL (ref 8.9–10.3)
Chloride: 110 mmol/L (ref 98–111)
Creatinine, Ser: 0.84 mg/dL (ref 0.44–1.00)
GFR, Estimated: >60 mL/min (ref >60)
Glucose, Bld: 88 mg/dL (ref 70–99)
Potassium: 3.4 mmol/L — ABNORMAL LOW (ref 3.5–5.1)
Sodium: 140 mmol/L (ref 135–145)

## 2023-12-12 LAB — HEPARIN LEVEL (UNFRACTIONATED): Heparin Unfractionated: 0.32 [IU]/mL (ref 0.30–0.70)

## 2023-12-12 LAB — GLUCOSE, CAPILLARY: Glucose-Capillary: 82 mg/dL (ref 70–99)

## 2023-12-12 MED ORDER — THIAMINE MONONITRATE 100 MG PO TABS
100.0000 mg | ORAL_TABLET | Freq: Every day | ORAL | Status: DC
  Administered 2023-12-12 – 2023-12-14 (×3): 100 mg via ORAL
  Filled 2023-12-12 (×3): qty 1

## 2023-12-12 MED ORDER — POTASSIUM CHLORIDE CRYS ER 20 MEQ PO TBCR
40.0000 meq | EXTENDED_RELEASE_TABLET | ORAL | Status: AC
  Administered 2023-12-12 (×2): 40 meq via ORAL
  Filled 2023-12-12 (×2): qty 2

## 2023-12-12 NOTE — Progress Notes (Signed)
 Physical Therapy Treatment Patient Details Name: Virginia Walters MRN: [id number] DOB: Aug 16, 1940 Today's Date: 12/12/2023   History of Present Illness Pt is an 83 y.o. female presenting to hospital 12/01/23 after being found down in her yard by EMS after possible syncopal episode; incontinent of stool and urine; pt was doing colonoscopy prep.  Pt admitted with UTI, possible syncope, acute metabolic encephalopathy, AKI, symptomatic anemia.  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  Recent hospitalization 4/1 to 11/17/23 with symptomatic anemia requiring 3 units PRBC's complicated by acute encephalopathy.    PT Comments  Pt asleep earlier, difficult to rouse, but upon return later, finishing lunch with 2 friends visiting from church. Pt is alert throughout session, somewhat delayed in response, but largely appropriate in responses. Pt partakes in seated and standing balance and strengthening activities, shows generally good tolerance in standing, has less DOE throughout session. Pt does all on room air, 97% SpO2 at end of session. PT remains fairly weak, unable to get out of bed, get into bed, or stand up without physical assistance and/or supervision assist for problem solving. Will continue to follow.    If plan is discharge home, recommend the following: A little help with walking and/or transfers;A little help with bathing/dressing/bathroom;Assistance with cooking/housework;Assist for transportation;Help with stairs or ramp for entrance   Can travel by private vehicle     Yes  Equipment Recommendations  None recommended by PT    Recommendations for Other Services       Precautions / Restrictions Precautions Precautions: Fall Restrictions Weight Bearing Restrictions Per Provider Order: No     Mobility  Bed Mobility Overal bed mobility: Needs Assistance Bed Mobility: Supine to  Sit, Sit to Supine     Supine to sit: Min assist, HOB elevated, Used rails Sit to supine: Min assist, HOB elevated, Used rails        Transfers Overall transfer level: Needs assistance Equipment used: Rolling walker (2 wheels) Transfers: Sit to/from Stand Sit to Stand: Min assist           General transfer comment: several STS transfers in session from elevated.    Ambulation/Gait Ambulation/Gait assistance: Contact guard assist, Supervision (not a focus of session)   Assistive device: Rolling walker (2 wheels)             Stairs             Wheelchair Mobility     Tilt Bed    Modified Rankin (Stroke Patients Only)       Balance                                            Communication    Cognition Arousal: Alert Behavior During Therapy: WFL for tasks assessed/performed   PT - Cognitive impairments: History of cognitive impairments                           Following commands impaired: Only follows one step commands consistently    Cueing    Exercises Other Exercises Other Exercises: STS from elevated EOB several times in session; SPT to/from BSC; AMB forward/backward bedside to/from sink Other Exercises: lateral stepping along countertop x3 bilat Other Exercises: stnading a sink, washing face with washcloth Rt hand  Other Exercises: seated pericare RUE post voiding BSC Other Exercises: seated marching x20, alternating; standing marching in place with RW x8 (more fatiguing)    General Comments        Pertinent Vitals/Pain Pain Assessment Pain Assessment: No/denies pain    Home Living                          Prior Function            PT Goals (current goals can now be found in the care plan section) Acute Rehab PT Goals Patient Stated Goal: to go home PT Goal Formulation: With patient Time For Goal Achievement: 12/16/23 Potential to Achieve Goals: Fair Progress towards PT goals:  Progressing toward goals    Frequency    Min 2X/week      PT Plan      Co-evaluation              AM-PAC PT "6 Clicks" Mobility   Outcome Measure  Help needed turning from your back to your side while in a flat bed without using bedrails?: A Lot Help needed moving from lying on your back to sitting on the side of a flat bed without using bedrails?: A Lot Help needed moving to and from a bed to a chair (including a wheelchair)?: A Lot Help needed standing up from a chair using your arms (e.g., wheelchair or bedside chair)?: A Lot Help needed to walk in hospital room?: A Little Help needed climbing 3-5 steps with a railing? : A Little 6 Click Score: 14    End of Session   Activity Tolerance: Patient tolerated treatment well;No increased pain Patient left: in bed;with call bell/phone within reach;with bed alarm set Nurse Communication: Mobility status;Precautions PT Visit Diagnosis: Other abnormalities of gait and mobility (R26.89);Muscle weakness (generalized) (M62.81);History of falling (Z91.81)     Time: 1610-9604 PT Time Calculation (min) (ACUTE ONLY): 31 min  Charges:    $Therapeutic Exercise: 8-22 mins $Therapeutic Activity: 8-22 mins PT General Charges $$ ACUTE PT VISIT: 1 Visit                     2:47 PM, 12/12/23 Dawn Eth, PT, DPT Physical Therapist - [facility]  Outpatient Physical Therapy- Main Campus [phone]     Latashia Koch C 12/12/2023, 2:43 PM

## 2023-12-12 NOTE — Progress Notes (Signed)
 Mobility Specialist - Progress Note   12/12/23 1103  Mobility  Activity Transferred from chair to bed  Level of Assistance Minimal assist, patient does 75% or more (+2)  Assistive Device Front wheel walker  Distance Ambulated (ft) 6 ft  Activity Response Tolerated well  Mobility visit 1 Mobility  Mobility Specialist Start Time (ACUTE ONLY) 1042  Mobility Specialist Stop Time (ACUTE ONLY) 1048  Mobility Specialist Time Calculation (min) (ACUTE ONLY) 6 min   Pt sitting in the recliner upon entry, requesting to transfer to bed. Pt STS to RW MinA +2 for safety and transferred to bed CGA --taking a few lateral and backwards steps towards the [facility]. Pt left supine with needs within reach, NT present at bedside.  Versa Gore Mobility Specialist 12/12/23 11:14 AM

## 2023-12-12 NOTE — Progress Notes (Signed)
 Progress Note    12/12/2023 8:15 AM 1 Day Post-Op  Subjective:  Virginia Walters is an 83 yo female now POD #1 from Pulmonary thrombectomy.  Patient is resting comfortably sitting up in bed this morning.  Patient endorses she feels like she is breathing better.  Confusion is much less this morning as she answered all my questions appropriately.  She follows commands appropriately this morning as well.  She still remains on 3 L nasal cannula oxygen  but overall looks better.  No complaints overnight.  Vitals all remained stable.  Review of Systems  Constitutional:  Constitutional negative. Eyes: Eyes negative.  Respiratory: Positive for shortness of breath and wheezing.  Cardiovascular: Cardiovascular negative.  GI: Gastrointestinal negative.  GU: Genitourinary negative. Musculoskeletal: Musculoskeletal negative.  Skin: Skin negative.  Neurological: Neurological negative. Psychiatric: Psychiatric negative.  All other systems reviewed and are negative   Vitals:   12/12/23 0405 12/12/23 0741  BP: (!) 155/74 (!) 144/71  Pulse: 77 77  Resp: 16 16  Temp: 98.6 F (37 C) 97.9 F (36.6 C)  SpO2: 98% 100%   Physical Exam: Cardiac:  RRR remains Tachycardic in 110-119.  No rubs clicks gallops or murmurs noted. Lungs: Slightly labored breathing this morning.  Scattered rhonchi and wheezing without rales.  Patient remains on 3 L nasal cannula oxygen  for saturations 97 to 100%. Incisions: Right femoral vein.  Dressing clean dry and intact no hematoma seroma to note. Extremities: Palpable pulses throughout all extremities.  All extremities are warm to touch. Abdomen: Positive bowel sounds throughout, soft, nontender and nondistended. Neurologic: AAO x 3 this morning.  Patient answers all my questions and follows commands appropriately.  Much less encephalopathy and confusion noted today.  CBC    Component Value Date/Time   WBC 6.9 12/12/2023 0443   RBC 3.29 (L) 12/12/2023 0443   HGB 8.4  (L) 12/12/2023 0443   HCT 27.0 (L) 12/12/2023 0443   PLT 193 12/12/2023 0443   MCV 82.1 12/12/2023 0443   MCH 25.5 (L) 12/12/2023 0443   MCHC 31.1 12/12/2023 0443   RDW 21.6 (H) 12/12/2023 0443   LYMPHSABS 1.1 12/12/2023 0443   MONOABS 0.6 12/12/2023 0443   EOSABS 0.8 (H) 12/12/2023 0443   BASOSABS 0.1 12/12/2023 0443    BMET    Component Value Date/Time   NA 140 12/12/2023 0443   K 3.4 (L) 12/12/2023 0443   K 4.2 07/22/2013 1325   CL 110 12/12/2023 0443   CO2 24 12/12/2023 0443   GLUCOSE 88 12/12/2023 0443   BUN 11 12/12/2023 0443   BUN 26 (A) 08/24/2009 0000   CREATININE 0.84 12/12/2023 0443   CREATININE 1.12 (H) 11/13/2023 0939   CALCIUM  9.0 12/12/2023 0443   GFRNONAA >60 12/12/2023 0443   GFRNONAA 39 (L) 11/13/2020 1438   GFRAA 45 (L) 11/13/2020 1438    INR    Component Value Date/Time   INR 1.4 (H) 09/17/2021 0940    No intake or output data in the 24 hours ending 12/12/23 0815   Assessment/Plan:  83 y.o. female is s/p Pulmonary Thrombectomy  1 Day Post-Op   PLAN Heparin  infusion can be stopped today.  Patient to be converted over to Eliquis 5 mg twice daily with no loading dose. Regular diet. OOB to bedside chair with assistance. Continue physical therapy and Occupational Therapy. Avoid all narcotics due to prior encephalopathy and confusion.  Tylenol  for pain. Wean off oxygen  as tolerated.  DVT prophylaxis: Heparin  infusion   Annamaria [facility] 12/12/2023 8:15 AM

## 2023-12-12 NOTE — TOC Progression Note (Addendum)
 Transition of Care [facility]) - Progression Note    Patient Details  Name: Virginia Walters MRN: [id number] Date of Birth: 1941/05/22  Transition [facility] Care [facility] CM/SW Contact  Loman Risk, RN Phone Number: 12/12/2023, 11:06 AM  Clinical Narrative:     Message sent to MD to determine when it is anticipated patient will medically be ready for discharge TOC to follow up with daughter on bed offers, and patient will require insurance auth   1135 am - Per MD anticipated medical readiness for DC 48 hours.  VM left for daughter [name] requesting return call to review bed offers     Expected Discharge Plan and Services                                               Social Determinants of Health (SDOH) Interventions SDOH Screenings   Food Insecurity: No Food Insecurity (12/02/2023)  Housing: Low Risk  (12/02/2023)  Transportation Needs: No Transportation Needs (12/02/2023)  Utilities: Not At Risk (12/02/2023)  Alcohol Screen: Low Risk  (05/25/2022)  Depression (PHQ2-9): Low Risk  (11/13/2023)  Financial Resource Strain: Low Risk  (05/25/2022)  Physical Activity: Inactive (05/25/2022)  Social Connections: Moderately Integrated (12/02/2023)  Stress: No Stress Concern Present (05/25/2022)  Tobacco Use: Medium Risk (12/06/2023)    Readmission Risk Interventions    09/20/2021   12:40 PM  Readmission Risk Prevention Plan  Transportation Screening Complete  PCP or Specialist Appt within 5-7 Days Complete  Home Care Screening Complete  Medication Review (RN CM) Complete

## 2023-12-12 NOTE — Progress Notes (Signed)
 PHARMACY - ANTICOAGULATION CONSULT NOTE  Pharmacy Consult for Heparin   Indication: pulmonary embolus  Allergies  Allergen Reactions   Ibuprofen     Oxycodone  Itching   Patient Measurements: Height: 5\' 7"  (170.2 cm) Weight: 103.2 kg (227 lb 8.2 oz) IBW/kg (Calculated) : 61.6 HEPARIN  DW (KG): 84.8  Vital Signs: Temp: 98.6 F (37 C) (04/29 0405) Temp Source: Oral (04/29 0405) BP: 155/74 (04/29 0405) Pulse Rate: 77 (04/29 0405)  Labs: Recent Labs    12/10/23 0454 12/11/23 0549 12/12/23 0443  HGB 8.4* 8.9* 8.4*  HCT 27.4* 29.7* 27.0*  PLT 189 194 193  HEPARINUNFRC 0.50 0.40 0.32  CREATININE 1.00 1.09* 0.84   Estimated Creatinine Clearance: 63.7 mL/min (by C-G formula based on SCr of 0.84 mg/dL).  Medical History: Past Medical History:  Diagnosis Date   (HFpEF) heart failure with preserved ejection fraction (HCC)    a.) TTE 07/22/2018: EF 45-50%; mild LVH; mild diffuse HK; mild LA dilation; PASP 30-35 mmHg; G1DD.   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   Past Week   dorzolamide  (TRUSOPT ) 2 % ophthalmic solution Place 1 drop into the left eye 2 (two) times daily.   Past Week   doxazosin  (CARDURA ) 4 MG tablet TAKE ONE-HALF TABLET BY MOUTH  DAILY 50 tablet 2 Past Week   fish oil-omega-3 fatty acids  1000 MG capsule Take 1,000 mg by mouth daily.   Past Week   lisinopril  (ZESTRIL ) 40 MG tablet TAKE 1 TABLET BY MOUTH DAILY 100 tablet 2 Past Week   Multiple Vitamin (MULTIVITAMIN) tablet Take 1 tablet by mouth daily.   Past Week   pantoprazole  (PROTONIX ) 40 MG tablet Take 1 tablet (40 mg total) by mouth daily. 30 tablet 0 Past Week   polyethylene glycol (GOLYTELY ) 236 g solution 5pm the evening before, mix very well and drink 8 ounces every 20 minutes until (1/2) of the liquid has been completed On the day of procedure, 5 hours before, drink 8 ounces every 15 minutes until complete 4000 mL 0 12/01/2023   pravastatin  (PRAVACHOL ) 40 MG tablet Take 1 tablet (40 mg total) by mouth daily. 09/29/23-PT NEEDS APPOINTMENT 30 tablet 0 Past Month   solifenacin  (VESICARE ) 10 MG tablet Take 1 tablet (10 mg total)  by mouth daily. 90 tablet 3 Past Week   traZODone  (DESYREL ) 50 MG tablet TAKE 1 TABLET BY MOUTH AT  BEDTIME 90 tablet 3 Past Week   BREZTRI  AEROSPHERE 160-9-4.8 MCG/ACT AERO USE 2 INHALATIONS BY MOUTH INTO  THE LUNGS IN THE MORNING AND AT  BEDTIME (Patient not taking: Reported on 11/14/2023) 32.1 g 3 Not Taking   feeding supplement (ENSURE SURGERY) LIQD Take 237 mLs by mouth 2 (two) times daily between meals.      Pertinent Medications  No prior anticoag noted  Assessment: DM is a 83 yo female who presented to the ED due to a syncopal episode. Patient was found to have multiple submental and subsegmental pulmonary emboli. Pharmacy has been consulted to dose heparin  in this 83 year old female  admitted with PE.   CrCl = 37.2 ml/min   Baseline Labs Hgb 9.2 Plt 222 INR/PT - not collected aPTT - not collected   Goal of Therapy:  Heparin  level 0.3-0.7 units/ml Monitor platelets by anticoagulation protocol: Yes  Heparin  Levels Date/Time HL Assessment  04/25@1546  0.83 SUPRAtherapeutic 04/26@0240  0.68 Therapeutic x 1 04/26@1037  0.50 Therapeutic x 2 04/27@0454    0.50     Therapeutic X 3  04/28@0544    0.40     Therapeutic X 4 04/29 0443 0.32 Therapeutic x 5  Plan:  -- Heparin  is therapeutic x 5 -- Continue heparin  infusion at 1,200 units/hour -- Recheck the next heparin  level with AM labs -- Continue to follow CBC daily   Thank you for allowing pharmacy to participate in this patient's care.   Coretta Dexter, PharmD, [facility] 12/12/2023 6:03 AM

## 2023-12-12 NOTE — Progress Notes (Signed)
 Occupational Therapy Treatment Patient Details Name: Virginia Walters MRN: [id number] DOB: [birth year] Today's Date: 12/12/2023   History of present illness Pt is an 83 y.o. female presenting to hospital 12/01/23 after being found down in her yard by EMS after possible syncopal episode; incontinent of stool and urine; pt was doing colonoscopy prep.  Pt admitted with UTI, possible syncope, acute metabolic encephalopathy, AKI, symptomatic anemia.  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  Recent hospitalization 4/1 to 11/17/23 with symptomatic anemia requiring 3 units PRBC's complicated by acute encephalopathy.   OT comments  [name] was seen for OT treatment on this date. Upon arrival to room pt in bed, agreeable to tx. Pt requires initial MIN A + RW to stand from bed height improves to CGA standing from elevated toilet height. CGA + RW for toilet t/f, pericare, and standing grooming task, tolerance limited by fatigue ~2 min standing. SpO2 98% on 3L [place] with activity however increased WOB noted. Pt making good progress toward goals, will continue to follow POC. Discharge recommendation remains appropriate.        If plan is discharge home, recommend the following:  A lot of help with bathing/dressing/bathroom;Assistance with cooking/housework;A little help with walking and/or transfers;Supervision due to cognitive status   Equipment Recommendations  Other (comment) (defer)    Recommendations for Other Services      Precautions / Restrictions Precautions Precautions: Fall Recall of Precautions/Restrictions: Impaired Restrictions Weight Bearing Restrictions Per Provider Order: No       Mobility Bed Mobility Overal bed mobility: Needs Assistance Bed Mobility: Supine to Sit Rolling: Min assist              Transfers Overall transfer level: Needs assistance Equipment used:  Rolling walker (2 wheels) Transfers: Sit to/from Stand Sit to Stand: Min assist                 Balance Overall balance assessment: Needs assistance Sitting-balance support: No upper extremity supported, Feet supported Sitting balance-Leahy Scale: Good     Standing balance support: Single extremity supported, During functional activity Standing balance-Leahy Scale: Fair                             ADL either performed or assessed with clinical judgement   ADL Overall ADL's : Needs assistance/impaired                                       General ADL Comments: CGA + RW for toilet t/f and standing grooming task, tolerance limited by fatigue. MOD A for LB access in sitting      Cognition Arousal: Alert Behavior During Therapy: WFL for tasks assessed/performed Cognition: Cognition impaired   Orientation impairments: Nutritional therapist functioning impairment (select all impairments): Problem solving OT - Cognition Comments: oriented to location as hospital (unable to state [place]) and time as April (states year as 2024)                 Following commands: Intact                      Pertinent Vitals/ Pain       Pain Assessment Pain Assessment: 0-10 Pain Score: 2  Pain Location: stomach Pain Descriptors / Indicators: Grimacing Pain Intervention(s): Limited activity within patient's tolerance, Repositioned   Frequency  Min 2X/week        Progress Toward Goals  OT Goals(current goals can now be found in the care plan section)  Progress towards OT goals: Progressing toward goals  Acute Rehab OT Goals OT Goal Formulation: With patient Time For Goal Achievement: 12/16/23 Potential to Achieve Goals: Fair ADL Goals Pt Will Perform Grooming: with supervision Pt Will Perform Lower Body Dressing: with supervision Pt Will Transfer to Toilet: with supervision  Plan      Co-evaluation                  AM-PAC OT "6 Clicks" Daily Activity     Outcome Measure   Help from another person eating meals?: None Help from another person taking care of personal grooming?: A Little Help from another person toileting, which includes using toliet, bedpan, or urinal?: A Little Help from another person bathing (including washing, rinsing, drying)?: A Lot Help from another person to put on and taking off regular upper body clothing?: A Little Help from another person to put on and taking off regular lower body clothing?: A Lot 6 Click Score: 17    End of Session Equipment Utilized During Treatment: Rolling walker (2 wheels);Gait belt;Oxygen   OT Visit Diagnosis: Other abnormalities of gait and mobility (R26.89);Muscle weakness (generalized) (M62.81);Unsteadiness on feet (R26.81)   Activity Tolerance Patient tolerated treatment well   Patient Left in chair;with call bell/phone within reach;with chair alarm set   Nurse Communication Mobility status        Time: 1610-9604 OT Time Calculation (min): 20 min  Charges: OT General Charges $OT Visit: 1 Visit OT Treatments $Self Care/Home Management : 8-22 mins  Gordan Latina, M.S. OTR/L  12/12/23, 9:33 AM  ascom [phone]

## 2023-12-12 NOTE — Progress Notes (Signed)
 Progress Note   [name] [id number] DOB: [birth year] DOA: 12/01/2023     10 DOS: the patient was seen and examined on 12/12/2023    Brief hospital course:   From HPI "Virginia Walters is a 83 y.o. female with medical history significant for HTN, HLD, COPD, HFpEF( EF 55-60% 11/16/2023), alcohol use disorder, chronic back pain, neuropathy, recently admitted from 4/1 to 4/4 with symptomatic anemia requiring 3 units PRBCs, complicated by acute encephalopathy attributed to delirium versus alcohol abuse versus baseline dementia, referred for outpatient GI workup and IV iron  with hematology, with good being admitted with a syncopal episode that occurred during the course of a colonoscopy prep. Tomorrow.  Patient was found sitting outside confused.  Per triage note, there was evidence of a trail of fecal incontinence from where she was sitting to inside the house and within the house.  Patient is currently oriented to person and place and is not sure about the circumstances but states she must have passed out. Patient was admitted for further evaluation and management of acute metabolic encephalopathy and question of possible syncopal episode and AKI.  GI was consulted on admission, they do not plan for endoscopy.     Assessment and Plan:   Acute hypoxic respiratory failure secondary to multiple pulmonary embolism 12/08/2023 patient developed worsening respiratory function and CT scan of the chest obtained showed extensive pulmonary embolism.  Plan of care discussed with vascular surgery team Patient was taken for pulmonary thrombectomy on 12/11/2023 Currently on heparin  drip to be transitioned to Eliquis later Continue heparin  drip for now     UTI - POA.  Not septic. 4/20 -- urine culture grew pan-sensitive E.coli Has completed 5 days course of antibiotic therapy   Possible Syncope - in setting of pt doing bowel prep Acute metabolic encephalopathy - due to UTI, as well as  possible alcohol use CT head non-acute. No focal neurologic deficits. Mental status have improved since admission Patient has completed 5 days course of high-dose IV thiamine  Thiamine  level 86.4 which is normal Continue maintenance dose of thiamine  Monitor CBC closely Continue PT OT   Hypokalemia Potassium 3.4 today Continue repletion and monitoring  AKI (acute kidney injury)-improved Baseline Cr 1.0-1.1. Admitted with Cr 3.28 with pre-renal azotemia Monitor renal function closely   Dementia,  Continue delirium precaution   Symptomatic anemia Presumed GI.  Had recent transfusion of 3 units PRBCs and Venofer  -Status post EGD, colonoscopy and complaints capsule endoscopy Continue current diet Monitor hemoglobin closely and transfuse as needed to maintain hemoglobin greater than 8   COPD (chronic obstructive pulmonary disease) (HCC) Not acutely exacerbated Continue as needed nebulization   Chronic systolic heart failure (HCC) EF 55 to 60% with G1 DD.  History of systolic heart failure Continue lisinopril  as renal function is improved   Alcohol use disorder EtOH level undetectable on admission Pt's cousin reports history of drinking wine 4-5+ glasses per day when she is drinking, but looked around pt's home and garbage cans and did not see evidence of recent drinking. --Cancelled CIWA protocol   HTN (hypertension) Continue lisinopril  40 mg daily Continue as needed hydralazine        Subjective:  Patient seen and examined at bedside this morning Currently on 3 L of oxygen  Status post pulmonary thrombectomy by vascular surgeon yesterday Admits to improvement in respiratory function  Physical Exam: General exam: awake, alert, no acute distress, obese, mental status improved HEENT: moist mucus membranes, hearing grossly normal  Respiratory system: expiratory  wheezes, normal respiratory effort on room air. Cardiovascular system: RRR,no peripheral edema, normal  S1/S2 Gastrointestinal system: soft, NT, ND Central nervous system: Patient is alert and oriented x 3 but lethargic Extremities: moves all, no edema, normal tone Psychiatry: normal mood, congruent affect, confusion improved   Family Communication: Discussed with patient's daughter as well as son-in-law at bedside on 05/11/2024     Disposition: Status is: Inpatient Remains inpatient appropriate because: --SNF placement recommended.      Planned Discharge Destination: PT has recommended skilled nursing facility placement     Time spent:    Data Reviewed:     Latest Ref Rng & Units 12/12/2023    4:43 AM 12/11/2023    5:49 AM 12/10/2023    4:54 AM  CBC  WBC 4.0 - 10.5 K/uL 6.9  7.0  5.9   Hemoglobin 12.0 - 15.0 g/dL 8.4  8.9  8.4   Hematocrit 36.0 - 46.0 % 27.0  29.7  27.4   Platelets 150 - 400 K/uL 193  194  189        Latest Ref Rng & Units 12/12/2023    4:43 AM 12/11/2023    5:49 AM 12/10/2023    4:54 AM  BMP  Glucose 70 - 99 mg/dL 88  92  79   BUN 8 - 23 mg/dL 11  17  21    Creatinine 0.44 - 1.00 mg/dL 1.61  0.96  0.45   Sodium 135 - 145 mmol/L 140  141  140   Potassium 3.5 - 5.1 mmol/L 3.4  3.7  3.6   Chloride 98 - 111 mmol/L 110  112  108   CO2 22 - 32 mmol/L 24  24  23    Calcium  8.9 - 10.3 mg/dL 9.0  8.6  8.4     Vitals:   12/12/23 0500 12/12/23 0741 12/12/23 1437 12/12/23 1453  BP:  (!) 144/71  138/64  Pulse:  77  82  Resp:  16  16  Temp:  97.9 F (36.6 C)  (!) 97.5 F (36.4 C)  TempSrc:  Oral    SpO2:  100% 97% 94%  Weight: 103.2 kg     Height:        Time spent: 41 minutes  Author: Ezzard Holms, MD 12/12/2023 5:59 PM  For on call review [email or web address].

## 2023-12-13 ENCOUNTER — Telehealth (HOSPITAL_COMMUNITY): Payer: Self-pay | Admitting: Pharmacy Technician

## 2023-12-13 ENCOUNTER — Encounter: Payer: Self-pay | Admitting: Internal Medicine

## 2023-12-13 ENCOUNTER — Other Ambulatory Visit (HOSPITAL_COMMUNITY): Payer: Self-pay

## 2023-12-13 DIAGNOSIS — I5022 Chronic systolic (congestive) heart failure: Secondary | ICD-10-CM | POA: Diagnosis not present

## 2023-12-13 DIAGNOSIS — I10 Essential (primary) hypertension: Secondary | ICD-10-CM | POA: Diagnosis not present

## 2023-12-13 DIAGNOSIS — J449 Chronic obstructive pulmonary disease, unspecified: Secondary | ICD-10-CM

## 2023-12-13 DIAGNOSIS — I2699 Other pulmonary embolism without acute cor pulmonale: Secondary | ICD-10-CM | POA: Diagnosis not present

## 2023-12-13 DIAGNOSIS — G9341 Metabolic encephalopathy: Secondary | ICD-10-CM | POA: Diagnosis not present

## 2023-12-13 LAB — CBC
HCT: 27.4 % — ABNORMAL LOW (ref 36.0–46.0)
Hemoglobin: 8.5 g/dL — ABNORMAL LOW (ref 12.0–15.0)
MCH: 25.3 pg — ABNORMAL LOW (ref 26.0–34.0)
MCHC: 31 g/dL (ref 30.0–36.0)
MCV: 81.5 fL (ref 80.0–100.0)
Platelets: 196 10*3/uL (ref 150–400)
RBC: 3.36 MIL/uL — ABNORMAL LOW (ref 3.87–5.11)
RDW: 22.5 % — ABNORMAL HIGH (ref 11.5–15.5)
WBC: 6.6 10*3/uL (ref 4.0–10.5)
nRBC: 0 % (ref 0.0–0.2)

## 2023-12-13 LAB — BASIC METABOLIC PANEL WITH GFR
Anion gap: 7 (ref 5–15)
BUN: 9 mg/dL (ref 8–23)
CO2: 23 mmol/L (ref 22–32)
Calcium: 8.9 mg/dL (ref 8.9–10.3)
Chloride: 109 mmol/L (ref 98–111)
Creatinine, Ser: 0.89 mg/dL (ref 0.44–1.00)
GFR, Estimated: >60 mL/min (ref >60)
Glucose, Bld: 137 mg/dL — ABNORMAL HIGH (ref 70–99)
Potassium: 3.7 mmol/L (ref 3.5–5.1)
Sodium: 139 mmol/L (ref 135–145)

## 2023-12-13 LAB — HEPARIN LEVEL (UNFRACTIONATED): Heparin Unfractionated: 0.32 [IU]/mL (ref 0.30–0.70)

## 2023-12-13 MED ORDER — APIXABAN 5 MG PO TABS
5.0000 mg | ORAL_TABLET | Freq: Two times a day (BID) | ORAL | Status: DC
Start: 2023-12-13 — End: 2023-12-14
  Administered 2023-12-13 – 2023-12-14 (×3): 5 mg via ORAL
  Filled 2023-12-13 (×3): qty 1

## 2023-12-13 NOTE — Plan of Care (Signed)
  Problem: Education: Goal: Knowledge of condition and prescribed therapy will improve Outcome: Progressing   Problem: Cardiac: Goal: Will achieve and/or maintain adequate cardiac output Outcome: Progressing   Problem: Physical Regulation: Goal: Complications related to the disease process, condition or treatment will be avoided or minimized Outcome: Progressing   Problem: Education: Goal: Knowledge of General Education information will improve Description: Including pain rating scale, medication(s)/side effects and non-pharmacologic comfort measures Outcome: Progressing   Problem: Health Behavior/Discharge Planning: Goal: Ability to manage health-related needs will improve Outcome: Progressing   Problem: Clinical Measurements: Goal: Ability to maintain clinical measurements within normal limits will improve Outcome: Progressing Goal: Will remain free from infection Outcome: Progressing Goal: Diagnostic test results will improve Outcome: Progressing Goal: Respiratory complications will improve Outcome: Progressing Goal: Cardiovascular complication will be avoided Outcome: Progressing   Problem: Activity: Goal: Risk for activity intolerance will decrease Outcome: Progressing   Problem: Nutrition: Goal: Adequate nutrition will be maintained Outcome: Progressing   Problem: Coping: Goal: Level of anxiety will decrease Outcome: Progressing   Problem: Elimination: Goal: Will not experience complications related to bowel motility Outcome: Progressing Goal: Will not experience complications related to urinary retention Outcome: Progressing

## 2023-12-13 NOTE — Telephone Encounter (Signed)
 Patient Product/process development scientist completed.    The patient is insured through [facility]. Patient has Medicare and is not eligible for a copay card, but may be able to apply for patient assistance or Medicare RX Payment Plan (Patient Must reach out to their plan, if eligible for payment plan), if available.    Ran test claim for Eliquis 5 mg and the current 30 day co-pay is $232.06 due to a deductible.   This test claim was processed through [facility]- copay amounts may vary at other pharmacies due to pharmacy/plan contracts, or as the patient moves through the different stages of their insurance plan.     Morgan Arab, CPHT Pharmacy Technician III Certified Patient Advocate [facility] Patient Advocate Team Direct Number: [phone]  Fax: [phone]

## 2023-12-13 NOTE — Progress Notes (Signed)
 PHARMACY - ANTICOAGULATION CONSULT NOTE  Pharmacy Consult for Heparin   Indication: pulmonary embolus  Allergies  Allergen Reactions   Ibuprofen     Oxycodone  Itching   Patient Measurements: Height: 5\' 7"  (170.2 cm) Weight: 103.9 kg (229 lb 0.9 oz) IBW/kg (Calculated) : 61.6 HEPARIN  DW (KG): 84.8  Vital Signs: Temp: 98.2 F (36.8 C) (04/30 0350) Temp Source: Oral (04/30 0350) BP: 146/74 (04/30 0350) Pulse Rate: 78 (04/30 0350)  Labs: Recent Labs    12/11/23 0549 12/12/23 0443 12/13/23 0450  HGB 8.9* 8.4* 8.5*  HCT 29.7* 27.0* 27.4*  PLT 194 193 196  HEPARINUNFRC 0.40 0.32 0.32  CREATININE 1.09* 0.84  --    Estimated Creatinine Clearance: 64 mL/min (by C-G formula based on SCr of 0.84 mg/dL).  Medical History: Past Medical History:  Diagnosis Date   (HFpEF) heart failure with preserved ejection fraction (HCC)    a.) TTE 07/22/2018: EF 45-50%; mild LVH; mild diffuse HK; mild LA dilation; PASP 30-35 mmHg; G1DD.   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   Past Week   dorzolamide  (TRUSOPT ) 2 % ophthalmic solution Place 1 drop into the left eye 2 (two) times daily.   Past Week   doxazosin  (CARDURA ) 4 MG tablet TAKE ONE-HALF TABLET BY MOUTH  DAILY 50 tablet 2 Past Week   fish oil-omega-3 fatty acids  1000 MG capsule Take 1,000 mg by mouth daily.   Past Week   lisinopril  (ZESTRIL ) 40 MG tablet TAKE 1 TABLET BY MOUTH DAILY 100 tablet 2 Past Week   Multiple Vitamin (MULTIVITAMIN) tablet Take 1 tablet by mouth daily.   Past Week   pantoprazole  (PROTONIX ) 40 MG tablet Take 1 tablet (40 mg total) by mouth daily. 30 tablet 0 Past Week   polyethylene glycol (GOLYTELY ) 236 g solution 5pm the evening before, mix very well and drink 8 ounces every 20 minutes until (1/2) of the liquid has been completed On the day of procedure, 5 hours before, drink 8 ounces every 15 minutes until complete 4000 mL 0 12/01/2023   pravastatin  (PRAVACHOL ) 40 MG tablet Take 1 tablet (40 mg total) by mouth daily. 09/29/23-PT NEEDS APPOINTMENT 30 tablet 0 Past Month   solifenacin  (VESICARE ) 10 MG tablet Take 1 tablet (10  mg total) by mouth daily. 90 tablet 3 Past Week   traZODone  (DESYREL ) 50 MG tablet TAKE 1 TABLET BY MOUTH AT  BEDTIME 90 tablet 3 Past Week   BREZTRI  AEROSPHERE 160-9-4.8 MCG/ACT AERO USE 2 INHALATIONS BY MOUTH INTO  THE LUNGS IN THE MORNING AND AT  BEDTIME (Patient not taking: Reported on 11/14/2023) 32.1 g 3 Not Taking   feeding supplement (ENSURE SURGERY) LIQD Take 237 mLs by mouth 2 (two) times daily between meals.      Pertinent Medications  No prior anticoag noted  Assessment: Virginia Walters is a 83 yo female who presented to the ED due to a syncopal episode. Patient was found to have multiple submental and subsegmental pulmonary emboli. Pharmacy has been consulted to dose heparin  in this 83 year old female  admitted with PE.   CrCl = 37.2 ml/min   Baseline Labs Hgb 9.2 Plt 222 INR/PT - not collected aPTT - not collected   Goal of Therapy:  Heparin  level 0.3-0.7 units/ml Monitor platelets by anticoagulation protocol: Yes  Heparin  Levels Date/Time HL Assessment  04/25@1546  0.83 SUPRAtherapeutic 04/26@0240  0.68 Therapeutic x 1 04/26@1037  0.50 Therapeutic x 2 04/27@0454    0.50     Therapeutic X 3  04/28@0544    0.40     Therapeutic X 4 04/29 0443 0.32 Therapeutic x 5. 04/30 0450 0.32 Therapeutic x 6  Plan:  -- Heparin  is therapeutic x 6 -- Continue heparin  infusion at 1,200 units/hour -- Recheck the next heparin  level with AM labs -- Continue to follow CBC daily   Thank you for allowing pharmacy to participate in this patient's care.   Coretta Dexter, PharmD, MBA 12/13/2023 7:02 AM

## 2023-12-13 NOTE — Progress Notes (Signed)
 Progress Note   [name] [id number]:[id number] DOB: 03/02/1941 DOA: 12/01/2023     11 DOS: the patient was seen and examined on 12/13/2023   Brief hospital course: From HPI "[name] is a 83 y.o. female with medical history significant for HTN, HLD, COPD, HFpEF( EF 55-60% 11/16/2023), alcohol use disorder, chronic back pain, neuropathy, recently admitted from 4/1 to 4/4 with symptomatic anemia requiring 3 units PRBCs, complicated by acute encephalopathy attributed to delirium versus alcohol abuse versus baseline dementia, referred for outpatient GI workup and IV iron  with hematology, with good being admitted with a syncopal episode that occurred during the course of a colonoscopy prep. Tomorrow.  Patient was found sitting outside confused.  Per triage note, there was evidence of a trail of fecal incontinence from where she was sitting to inside the house and within the house.  Patient is currently oriented to person and place and is not sure about the circumstances but states she must have passed out. Patient was admitted for further evaluation and management of acute metabolic encephalopathy and question of possible syncopal episode and AKI.  GI was consulted on admission, they do not plan for endoscopy. "  Further hospital course and management as outlined below. I assumed care 12/13/2023.  4/30 -- pt transitioned from IV heparin  to PO Eliquis this AM.  Monitoring O2 sats closely, weaned off oxygen  this morning.     Assessment and Plan:  Acute hypoxic respiratory failure secondary to multiple pulmonary embolism 12/08/2023 patient developed worsening respiratory function and CT scan of the chest obtained showed extensive pulmonary embolism.  Plan of care discussed with vascular surgery team Patient was taken for pulmonary thrombectomy on 12/11/2023 Currently on heparin  drip to be transitioned to Eliquis later Transition from heparin  drip >> Eliquis this AM Monitor O2 sats on  room air, supplement O2 if < 90% Check ambulatory O2 sats in prep for discharge     UTI - POA.  Not septic. 4/20 -- urine culture grew pan-sensitive E.coli Has completed 5 days course of antibiotic therapy   Possible Syncope - in setting of pt doing bowel prep Acute metabolic encephalopathy - due to UTI, as well as possible alcohol use CT head non-acute. No focal neurologic deficits. Mental status have improved since admission Patient has completed 5 days course of high-dose IV thiamine  Thiamine  level 86.4 which is normal Continue maintenance dose of thiamine  Monitor CBC closely Continue PT OT   Hypokalemia - resolved Monitor BMP and replace K as needed   AKI (acute kidney injury)- resolved Baseline Cr 1.0-1.1. Admitted with Cr 3.28 with pre-renal azotemia Monitor renal function closely   Dementia,  Continue delirium precaution   Symptomatic anemia Presumed GI.  Had recent transfusion of 3 units PRBCs and Venofer  -Status post EGD, colonoscopy and complaints capsule endoscopy Continue current diet Monitor hemoglobin closely and transfuse as needed to maintain hemoglobin greater than 8   COPD (chronic obstructive pulmonary disease) (HCC) Not acutely exacerbated Continue as needed nebulization   Chronic systolic heart failure (HCC) EF 55 to 60% with G1 DD.  History of systolic heart failure Continue lisinopril  as renal function is improved   Alcohol use disorder EtOH level undetectable on admission Pt's cousin reports history of drinking wine 4-5+ glasses per day when she is drinking, but looked around pt's home and garbage cans and did not see evidence of recent drinking. --Cancelled CIWA protocol   HTN (hypertension) Continue lisinopril  40 mg daily Amlodipine  5 mg daily was added  PRN IV hydralazine       Subjective: Pt awake sitting up in bed, RN at bedside when seen this AM.  She reports feeling much better today, denies shortness of breath, chest pain, fever or  chills.   Physical Exam: Vitals:   12/12/23 2001 12/13/23 0350 12/13/23 0352 12/13/23 0835  BP: (!) 159/62 (!) 146/74  (!) 151/61  Pulse: 81 78  75  Resp: 18 18  20   Temp: 99 F (37.2 C) 98.2 F (36.8 C)  98.4 F (36.9 C)  TempSrc: Oral Oral  Axillary  SpO2: 100% 99%  95%  Weight:   103.9 kg   Height:       General exam: awake, alert, no acute distress HEENT: moist mucus membranes, hearing grossly normal  Respiratory system: CTAB no wheezes, rales or rhonchi, normal respiratory effort. Cardiovascular system: normal S1/S2, RRR, no pedal edema.   Gastrointestinal system: soft, NT, ND, no HSM felt, +bowel sounds. Central nervous system: A&O x 2+. no gross focal neurologic deficits, normal speech Extremities: moves all, no edema, normal tone Skin: dry, intact, normal temperature Psychiatry: normal mood, congruent affect   Data Reviewed:  Notable labs --  Normal BMP except glucose 137 Hbg 8.4 >> 8.5 stable   Family Communication: Updated daughter by phone this afternoon.  She is agreeable with plan to discharge to rehab tomorrow if ongoing stability in patient's respiratory status.   Disposition: Status is: Inpatient Remains inpatient appropriate because: closely monitoring respiratory status s/p thrombectomy for extensive PE  Planned Discharge Destination: Skilled nursing facility    Time spent: 45 minutes  Author: Montey Apa, DO 12/13/2023 2:33 PM  For on call review [email or web address].

## 2023-12-13 NOTE — Progress Notes (Signed)
 Mobility Specialist - Progress Note  Pre-mobility: HR 80, SpO2 92% 80 Post-mobility: HR 79, SPO2 93%   12/13/23 1605  Mobility  Activity Stood at bedside;Transferred from bed to chair;Ambulated with assistance in room  Level of Assistance Standby assist, set-up cues, supervision of patient - no hands on  Assistive Device Front wheel walker  Distance Ambulated (ft) 6 ft  Activity Response Tolerated well  Mobility visit 1 Mobility  Mobility Specialist Start Time (ACUTE ONLY) 1505  Mobility Specialist Stop Time (ACUTE ONLY) 1528  Mobility Specialist Time Calculation (min) (ACUTE ONLY) 23 min   Pt supine upon entry, utilizing RA. Pt agreeable to transfer to the recliner this date. Pt completed bed mob ModA and dangled EOB while MS doff/dons gown. PT STS to RW MinA (elevated bed height) and transferred to the recliner taking lateral and backwards steps with MinG-supervision. Pt left seated in the recliner with alarm set and needs within reach. RN notified.  Versa Gore Mobility Specialist 12/13/23 4:26 PM

## 2023-12-13 NOTE — TOC Progression Note (Signed)
 Transition of Care [facility]) - Progression Note    Patient Details  Name: Virginia Walters MRN: [id number] Date of Birth: 07/08/1941  Transition of Care [facility]) CM/SW Contact  Loman Risk, RN Phone Number: 12/13/2023, 1:52 PM  Clinical Narrative:     Insurance auth approved for [employer].  Anticipated DC tomorrow        Expected Discharge Plan and Services                                               Social Determinants of Health (SDOH) Interventions SDOH Screenings   Food Insecurity: No Food Insecurity (12/02/2023)  Housing: Low Risk  (12/02/2023)  Transportation Needs: No Transportation Needs (12/02/2023)  Utilities: Not At Risk (12/02/2023)  Alcohol Screen: Low Risk  (05/25/2022)  Depression (PHQ2-9): Low Risk  (11/13/2023)  Financial Resource Strain: Low Risk  (05/25/2022)  Physical Activity: Inactive (05/25/2022)  Social Connections: Moderately Integrated (12/02/2023)  Stress: No Stress Concern Present (05/25/2022)  Tobacco Use: Medium Risk (12/06/2023)    Readmission Risk Interventions    09/20/2021   12:40 PM  Readmission Risk Prevention Plan  Transportation Screening Complete  PCP or Specialist Appt within 5-7 Days Complete  Home Care Screening Complete  Medication Review (RN CM) Complete

## 2023-12-13 NOTE — Progress Notes (Signed)
 Progress Note    12/13/2023 7:43 AM 2 Days Post-Op  Subjective:  Virginia Walters is an 83 yo female now POD #1 from Pulmonary thrombectomy.  Patient is resting comfortably sitting up in bed this morning.  Patient endorses she feels like she is breathing better.  Confusion is much less this morning as she answered all my questions appropriately.  She follows commands appropriately this morning as well.  She has been weaned down to 2 L nasal cannula oxygen  but overall looks better.  No complaints overnight.  Vitals all remained stable.   Review of Systems  Constitutional:  Constitutional negative. Eyes: Eyes negative.  Respiratory: Positive for shortness of breath and wheezing.  Cardiovascular: Cardiovascular negative.  GI: Gastrointestinal negative.  GU: Genitourinary negative. Musculoskeletal: Musculoskeletal negative.  Skin: Skin negative.  Neurological: Neurological negative. Psychiatric: Psychiatric negative.  All other systems reviewed and are negative  Vitals:   12/12/23 2001 12/13/23 0350  BP: (!) 159/62 (!) 146/74  Pulse: 81 78  Resp: 18 18  Temp: 99 F (37.2 C) 98.2 F (36.8 C)  SpO2: 100% 99%   Physical Exam: Cardiac:  RRR remains Tachycardic in 110-119.  No rubs clicks gallops or murmurs noted. Lungs: Slightly labored breathing this morning.  Scattered rhonchi and wheezing without rales.  Patient remains on 3 L nasal cannula oxygen  for saturations 97 to 100%. Incisions: Right femoral vein.  Dressing clean dry and intact no hematoma seroma to note. Extremities: Palpable pulses throughout all extremities.  All extremities are warm to touch. Abdomen: Positive bowel sounds throughout, soft, nontender and nondistended. Neurologic: AAO x 3 this morning.  Patient answers all my questions and follows commands appropriately.  Much less encephalopathy and confusion noted today.  CBC    Component Value Date/Time   WBC 6.6 12/13/2023 0450   RBC 3.36 (L) 12/13/2023 0450    HGB 8.5 (L) 12/13/2023 0450   HCT 27.4 (L) 12/13/2023 0450   PLT 196 12/13/2023 0450   MCV 81.5 12/13/2023 0450   MCH 25.3 (L) 12/13/2023 0450   MCHC 31.0 12/13/2023 0450   RDW 22.5 (H) 12/13/2023 0450   LYMPHSABS 1.1 12/12/2023 0443   MONOABS 0.6 12/12/2023 0443   EOSABS 0.8 (H) 12/12/2023 0443   BASOSABS 0.1 12/12/2023 0443    BMET    Component Value Date/Time   NA 140 12/12/2023 0443   K 3.4 (L) 12/12/2023 0443   K 4.2 07/22/2013 1325   CL 110 12/12/2023 0443   CO2 24 12/12/2023 0443   GLUCOSE 88 12/12/2023 0443   BUN 11 12/12/2023 0443   BUN 26 (A) 08/24/2009 0000   CREATININE 0.84 12/12/2023 0443   CREATININE 1.12 (H) 11/13/2023 0939   CALCIUM  9.0 12/12/2023 0443   GFRNONAA >60 12/12/2023 0443   GFRNONAA 39 (L) 11/13/2020 1438   GFRAA 45 (L) 11/13/2020 1438    INR    Component Value Date/Time   INR 1.4 (H) 09/17/2021 0940     Intake/Output Summary (Last 24 hours) at 12/13/2023 0743 Last data filed at 12/13/2023 0500 Gross per 24 hour  Intake 867.31 ml  Output 600 ml  Net 267.31 ml     Assessment/Plan:  83 y.o. female is s/p Pulmonary Thrombectomy  2 Days Post-Op   PLAN Daily Eliquis 5 mg twice daily with no loading dose. Regular diet. OOB to bedside chair with assistance. Continue physical therapy and Occupational Therapy. Avoid all narcotics due to prior encephalopathy and confusion.  Tylenol  for pain. Wean off oxygen  as tolerated.  DVT prophylaxis: Eliquis 5 mg Twice daily    Annamaria [facility] 12/13/2023 7:43 AM

## 2023-12-13 NOTE — TOC Progression Note (Addendum)
 Transition of Care [facility]) - Progression Note    Patient Details  Name: Virginia Walters MRN: [id number] Date of Birth: Aug 15, 1941  Transition of Care [facility]) CM/SW Contact  Loman Risk, RN Phone Number: 12/13/2023, 8:59 AM  Clinical Narrative:     Received call from daughter [name].  She accepts bed offer at [facility].  Accepted in HUB and notified Debra at [facility]. Patient will require auth.  MD notified. TOC to start auth when determined by MD medically appropriate   Update:  per MD patient medically stable for auth to be initiated.  Requested Nitchia with TOC to start auth for [facility]     Expected Discharge Plan and Services                                               Social Determinants of Health (SDOH) Interventions SDOH Screenings   Food Insecurity: No Food Insecurity (12/02/2023)  Housing: Low Risk  (12/02/2023)  Transportation Needs: No Transportation Needs (12/02/2023)  Utilities: Not At Risk (12/02/2023)  Alcohol Screen: Low Risk  (05/25/2022)  Depression (PHQ2-9): Low Risk  (11/13/2023)  Financial Resource Strain: Low Risk  (05/25/2022)  Physical Activity: Inactive (05/25/2022)  Social Connections: Moderately Integrated (12/02/2023)  Stress: No Stress Concern Present (05/25/2022)  Tobacco Use: Medium Risk (12/06/2023)    Readmission Risk Interventions    09/20/2021   12:40 PM  Readmission Risk Prevention Plan  Transportation Screening Complete  PCP or Specialist Appt within 5-7 Days Complete  Home Care Screening Complete  Medication Review (RN CM) Complete

## 2023-12-14 ENCOUNTER — Other Ambulatory Visit: Payer: Self-pay | Admitting: *Deleted

## 2023-12-14 DIAGNOSIS — K219 Gastro-esophageal reflux disease without esophagitis: Secondary | ICD-10-CM | POA: Diagnosis not present

## 2023-12-14 DIAGNOSIS — I5022 Chronic systolic (congestive) heart failure: Secondary | ICD-10-CM | POA: Diagnosis not present

## 2023-12-14 DIAGNOSIS — R55 Syncope and collapse: Secondary | ICD-10-CM | POA: Diagnosis not present

## 2023-12-14 DIAGNOSIS — M6281 Muscle weakness (generalized): Secondary | ICD-10-CM | POA: Diagnosis not present

## 2023-12-14 DIAGNOSIS — K5520 Angiodysplasia of colon without hemorrhage: Secondary | ICD-10-CM | POA: Diagnosis present

## 2023-12-14 DIAGNOSIS — I2699 Other pulmonary embolism without acute cor pulmonale: Secondary | ICD-10-CM | POA: Diagnosis not present

## 2023-12-14 DIAGNOSIS — I130 Hypertensive heart and chronic kidney disease with heart failure and stage 1 through stage 4 chronic kidney disease, or unspecified chronic kidney disease: Secondary | ICD-10-CM | POA: Diagnosis not present

## 2023-12-14 DIAGNOSIS — Z7189 Other specified counseling: Secondary | ICD-10-CM | POA: Diagnosis not present

## 2023-12-14 DIAGNOSIS — G9341 Metabolic encephalopathy: Secondary | ICD-10-CM | POA: Diagnosis not present

## 2023-12-14 DIAGNOSIS — I10 Essential (primary) hypertension: Secondary | ICD-10-CM | POA: Diagnosis not present

## 2023-12-14 DIAGNOSIS — H409 Unspecified glaucoma: Secondary | ICD-10-CM | POA: Diagnosis not present

## 2023-12-14 DIAGNOSIS — G894 Chronic pain syndrome: Secondary | ICD-10-CM | POA: Diagnosis not present

## 2023-12-14 DIAGNOSIS — D500 Iron deficiency anemia secondary to blood loss (chronic): Secondary | ICD-10-CM | POA: Diagnosis present

## 2023-12-14 DIAGNOSIS — D649 Anemia, unspecified: Secondary | ICD-10-CM | POA: Diagnosis not present

## 2023-12-14 DIAGNOSIS — Z87891 Personal history of nicotine dependence: Secondary | ICD-10-CM | POA: Diagnosis not present

## 2023-12-14 DIAGNOSIS — J449 Chronic obstructive pulmonary disease, unspecified: Secondary | ICD-10-CM | POA: Diagnosis not present

## 2023-12-14 DIAGNOSIS — E785 Hyperlipidemia, unspecified: Secondary | ICD-10-CM | POA: Diagnosis not present

## 2023-12-14 DIAGNOSIS — N390 Urinary tract infection, site not specified: Secondary | ICD-10-CM | POA: Diagnosis not present

## 2023-12-14 LAB — CBC
HCT: 25.6 % — ABNORMAL LOW (ref 36.0–46.0)
Hemoglobin: 8.2 g/dL — ABNORMAL LOW (ref 12.0–15.0)
MCH: 25.9 pg — ABNORMAL LOW (ref 26.0–34.0)
MCHC: 32 g/dL (ref 30.0–36.0)
MCV: 81 fL (ref 80.0–100.0)
Platelets: 197 10*3/uL (ref 150–400)
RBC: 3.16 MIL/uL — ABNORMAL LOW (ref 3.87–5.11)
RDW: 22.6 % — ABNORMAL HIGH (ref 11.5–15.5)
WBC: 6.4 10*3/uL (ref 4.0–10.5)
nRBC: 0 % (ref 0.0–0.2)

## 2023-12-14 LAB — GLUCOSE, CAPILLARY: Glucose-Capillary: 90 mg/dL (ref 70–99)

## 2023-12-14 MED ORDER — ALBUTEROL SULFATE HFA 108 (90 BASE) MCG/ACT IN AERS
2.0000 | INHALATION_SPRAY | Freq: Four times a day (QID) | RESPIRATORY_TRACT | Status: AC | PRN
Start: 2023-12-14 — End: ?

## 2023-12-14 MED ORDER — FLEET ENEMA RE ENEM
1.0000 | ENEMA | Freq: Once | RECTAL | Status: AC
  Administered 2023-12-14: 1 via RECTAL

## 2023-12-14 MED ORDER — AMLODIPINE BESYLATE 5 MG PO TABS: 5.0000 mg | ORAL_TABLET | Freq: Every day | ORAL | Status: DC

## 2023-12-14 MED ORDER — APIXABAN 5 MG PO TABS: 5.0000 mg | ORAL_TABLET | Freq: Two times a day (BID) | ORAL | Status: DC

## 2023-12-14 MED ORDER — ACETAMINOPHEN 325 MG PO TABS: 650.0000 mg | ORAL_TABLET | Freq: Four times a day (QID) | ORAL | Status: AC | PRN

## 2023-12-14 MED ORDER — VITAMIN B-1 100 MG PO TABS: 100.0000 mg | ORAL_TABLET | Freq: Every day | ORAL | Status: AC

## 2023-12-14 NOTE — Discharge Summary (Signed)
 Physician Discharge Summary   [name] MRN: [id number] DOB: [birth year]  Admit date:     12/01/2023  Discharge date: 12/14/23  Discharge Physician: Montey Apa   PCP: Austine Lefort, MD   Recommendations at discharge:   Follow up with Primary Care in 1-2 weeks Follow up with Vascular Surgery as scheduled Follow up on Blood Pressure control.  Amlodipine  was added due to uncontrolled BP's Repeat CBC, CMP at follow up Address duration of anticoagulation for PE  Discharge Diagnoses: Principal Problem:   Syncope Active Problems:   AKI (acute kidney injury) (HCC)   Dementia with behavioral problem   Acute metabolic encephalopathy   Symptomatic anemia   HTN (hypertension)   Alcohol use disorder   Chronic systolic heart failure (HCC)   COPD (chronic obstructive pulmonary disease) (HCC)   Acute pulmonary embolism without acute cor pulmonale (HCC)   Altered mental status  Resolved Problems:   * No resolved hospital problems. [facility] Course:  From HPI "Virginia Walters is a 83 y.o. female with medical history significant for HTN, HLD, COPD, HFpEF( EF 55-60% 11/16/2023), alcohol use disorder, chronic back pain, neuropathy, recently admitted from 4/1 to 4/4 with symptomatic anemia requiring 3 units PRBCs, complicated by acute encephalopathy attributed to delirium versus alcohol abuse versus baseline dementia, referred for outpatient GI workup and IV iron  with hematology, with good being admitted with a syncopal episode that occurred during the course of a colonoscopy prep. Tomorrow.  Patient was found sitting outside confused.  Per triage note, there was evidence of a trail of fecal incontinence from where she was sitting to inside the house and within the house.  Patient is currently oriented to person and place and is not sure about the circumstances but states she must have passed out. Patient was admitted for further evaluation and management of acute metabolic  encephalopathy and question of possible syncopal episode and AKI.  GI was consulted on admission, they do not plan for endoscopy. "   Further hospital course and management as outlined below. I assumed care 12/13/2023.   4/30 -- pt transitioned from IV heparin  to PO Eliquis  this AM.  Monitoring O2 sats closely, weaned off oxygen  this morning.      5/1 -- pt doing well, remains on room air with stable O2 sats.   Patient is medically stable for discharge to rehab today.   Assessment and Plan:  Acute hypoxic respiratory failure (RESOLVED) secondary to  Multiple pulmonary embolism 12/08/2023 patient developed worsening respiratory function and CT scan of the chest obtained showed extensive pulmonary embolism.  Plan of care discussed with vascular surgery team Patient was taken for pulmonary thrombectomy on 12/11/2023 Currently on heparin  drip to be transitioned to Eliquis  later Transitioned from heparin  drip >> Eliquis  on 4/30 Monitor O2 sats on room air, supplement O2 if < 90% Check ambulatory O2 sats in prep for discharge --Continue Eliquis  --PCP and Vascular Surgery Follow up     UTI - POA.  Not septic. 4/20 -- urine culture grew pan-sensitive E.coli Completed 5 days course of antibiotic therapy   Possible Syncope - in setting of pt doing bowel prep Acute metabolic encephalopathy - due to UTI, as well as possible alcohol use CT head non-acute. No focal neurologic deficits. Mental status have improved since admission Patient has completed 5 days course of high-dose IV thiamine  Thiamine  level 86.4 which is normal --Continue maintenance dose of thiamine  --Continue PT OT at rehab   Hypokalemia -  resolved --Repeat BMP at follow-up   AKI (acute kidney injury)- resolved Baseline Cr 1.0-1.1. Admitted with Cr 3.28 with pre-renal azotemia --Repeat BMP at follow-up   Mild cognitive impairment (no formal dementia diagnosis per family).  Suspect chronic alcohol use has contributed to  cognitive decline.   --Delirium precautions --Maintain wake/sleep cycles   Symptomatic anemia Presumed GI.  Had recent transfusion of 3 units PRBCs and Venofer  -Status post EGD, colonoscopy and capsule endoscopy --Repeat CBC in 1-2 weeks   COPD (chronic obstructive pulmonary disease) (HCC) Not acutely exacerbated --Resume Breztri  at d/c --PRN albuterol    Chronic systolic heart failure (HCC) EF 55 to 60% with G1 DD.  History of systolic heart failure --Continue lisinopril   --Monitor volume status --No on diuretics   Alcohol use disorder EtOH level undetectable on admission Pt's cousin reports history of drinking wine 4-5+ glasses per day when she is drinking, but looked around pt's home and garbage cans and did not see evidence of recent drinking. --Cancelled CIWA protocol   HTN (hypertension) --Continue lisinopril  40 mg daily --Amlodipine  5 mg daily was added --PCP follow up in 1-2 weeks, adjust regimen as needed       Consultants: GI, Vascular Surgery  Procedures performed: as above   Disposition: Skilled nursing facility  Diet recommendation:   Discharge Diet Orders (From admission, onward)     Start     Ordered   12/14/23 0000  Diet - low sodium heart healthy        12/14/23 0825            DISCHARGE MEDICATION: Allergies as of 12/14/2023       Reactions   Ibuprofen     Oxycodone  Itching        Medication List     STOP taking these medications    polyethylene glycol 236 g solution Commonly known as: GoLYTELY        TAKE these medications    acetaminophen  325 MG tablet Commonly known as: TYLENOL  Take 2 tablets (650 mg total) by mouth every 6 (six) hours as needed for mild pain (pain score 1-3), fever or headache (or Fever >/= 101).   albuterol  108 (90 Base) MCG/ACT inhaler Commonly known as: VENTOLIN  HFA Inhale 2 puffs into the lungs every 6 (six) hours as needed for wheezing or shortness of breath.   amLODipine  5 MG tablet Commonly  known as: NORVASC  Take 1 tablet (5 mg total) by mouth daily.   apixaban  5 MG Tabs tablet Commonly known as: ELIQUIS  Take 1 tablet (5 mg total) by mouth 2 (two) times daily.   Breztri  Aerosphere 160-9-4.8 MCG/ACT Aero inhaler Generic drug: budeson-glycopyrrolate -formoterol  USE 2 INHALATIONS BY MOUTH INTO  THE LUNGS IN THE MORNING AND AT  BEDTIME   dorzolamide  2 % ophthalmic solution Commonly known as: TRUSOPT  Place 1 drop into the left eye 2 (two) times daily.   doxazosin  4 MG tablet Commonly known as: CARDURA  TAKE ONE-HALF TABLET BY MOUTH  DAILY   feeding supplement Liqd Take 237 mLs by mouth 2 (two) times daily between meals.   fish oil-omega-3 fatty acids  1000 MG capsule Take 1,000 mg by mouth daily.   lisinopril  40 MG tablet Commonly known as: ZESTRIL  TAKE 1 TABLET BY MOUTH DAILY   multivitamin tablet Take 1 tablet by mouth daily.   pantoprazole  40 MG tablet Commonly known as: Protonix  Take 1 tablet (40 mg total) by mouth daily.   pravastatin  40 MG tablet Commonly known as: PRAVACHOL  Take 1 tablet (40 mg total)  by mouth daily. 09/29/23-PT NEEDS APPOINTMENT   solifenacin  10 MG tablet Commonly known as: VESICARE  Take 1 tablet (10 mg total) by mouth daily.   thiamine  100 MG tablet Commonly known as: Vitamin B-1 Take 1 tablet (100 mg total) by mouth daily.   traZODone  50 MG tablet Commonly known as: DESYREL  TAKE 1 TABLET BY MOUTH AT  BEDTIME   VITAMIN C  PO Take 1 tablet by mouth daily.        Contact information for after-discharge care     Destination     [facility]   Service: Skilled Nursing Contact information: [address] [place]  [phone]                    Discharge Exam: Cleavon Curls Weights   12/12/23 0500 12/13/23 0352 12/14/23 0500  Weight: 103.2 kg 103.9 kg 101.3 kg   General exam: awake, alert, no acute distress HEENT: atraumatic, clear conjunctiva, anicteric sclera, moist  mucus membranes, hearing grossly normal  Respiratory system: CTA, no wheezes, rales or rhonchi, normal respiratory effort. Cardiovascular system: normal S1/S2, RRR, no JVD, murmurs, rubs, gallops, no pedal edema.   Gastrointestinal system: soft, NT, ND, no HSM felt, +bowel sounds. Central nervous system: A&O x 2+. no gross focal neurologic deficits, normal speech Extremities: moves all, no edema, normal tone Skin: dry, intact, normal temperature Psychiatry: normal mood, congruent affect   Condition at discharge: stable  The results of significant diagnostics from this hospitalization (including imaging, microbiology, ancillary and laboratory) are listed below for reference.   Imaging Studies: PERIPHERAL VASCULAR CATHETERIZATION Result Date: 12/12/2023 See surgical note for result.  CT Angio Chest Pulmonary Embolism (PE) W or WO Contrast Result Date: 12/08/2023 CLINICAL DATA:  Pulmonary embolism suspected, positive D-dimer EXAM: CT ANGIOGRAPHY CHEST WITH CONTRAST TECHNIQUE: Multidetector CT imaging of the chest was performed using the standard protocol during bolus administration of intravenous contrast. Multiplanar CT image reconstructions and MIPs were obtained to evaluate the vascular anatomy. RADIATION DOSE REDUCTION: This exam was performed according to the departmental dose-optimization program which includes automated exposure control, adjustment of the mA and/or kV according to patient size and/or use of iterative reconstruction technique. CONTRAST:  75mL OMNIPAQUE  IOHEXOL  350 MG/ML SOLN COMPARISON:  None Available. FINDINGS: Cardiovascular: Satisfactory opacification of the pulmonary arteries to the segmental level. Multiple branching pulmonary artery filling defects seen in subsegmental vessels and segmental right upper lobe vessel. These are marked on series 4. The main pulmonary artery is dilated to 3.7 cm. No right heart dilatation when compared to the left. Minimal subendocardial fat  deposition at the left ventricle which may be related to prior ischemia. Extensive atheromatous calcification of the aorta and coronaries. Mediastinum/Nodes: Negative for mass or adenopathy. Moderate hiatal hernia. Lungs/Pleura: Mild ground-glass opacity at the right base which could be atelectasis or small pulmonary infarct. Reticulation of lung markings with a few branching micro nodules, likely chronic lung disease and stable from 2019 chest CT. Scarring is greatest at the lung bases, no honeycombing. Upper Abdomen: No acute finding Musculoskeletal: Extensive spondylosis with multi-level bridging osteophyte Review of the MIP images confirms the above findings. Critical Value/emergent results were called by telephone at the time of interpretation on 12/08/2023 at 7:01 am to provider [facility] , who verbally acknowledged these results. IMPRESSION: Multiple segmental to subsegmental pulmonary emboli which appear acute. No indication of right heart strain ; there is pulmonary artery dilatation compatible with chronic pulmonary hypertension. Atelectasis or small pulmonary infarct  at the right lung base. Sliding hiatal hernia. Atherosclerosis including the coronary arteries. Electronically Signed   By: Ronnette Coke M.D.   On: 12/08/2023 07:01   DG Chest Port 1 View Result Date: 12/08/2023 CLINICAL DATA:  Hypoxia EXAM: PORTABLE CHEST 1 VIEW COMPARISON:  11/16/2023 FINDINGS: Cardiomegaly. Increased bronchitic changes and patchy ill-defined pulmonary opacities. No pleural effusion. Hiatal hernia. IMPRESSION: Increased bronchitic changes and patchy ill-defined pulmonary opacities, suspect respiratory infection, possibly with atypical organism. Cardiomegaly Electronically Signed   By: Esmeralda Hedge M.D.   On: 12/08/2023 03:19   CT Head Wo Contrast Result Date: 12/01/2023 CLINICAL DATA:  Mental status change, unknown cause. Possible syncopal episode. EXAM: CT HEAD WITHOUT CONTRAST TECHNIQUE: Contiguous axial  images were obtained from the base of the skull through the vertex without intravenous contrast. RADIATION DOSE REDUCTION: This exam was performed according to the departmental dose-optimization program which includes automated exposure control, adjustment of the mA and/or kV according to patient size and/or use of iterative reconstruction technique. COMPARISON:  Head CT 07/22/2021. FINDINGS: Brain: No acute hemorrhage. Unchanged background of moderate chronic small-vessel disease. Gray-white differentiation is preserved. Unchanged 4 mm hyperdense lesion along the roof of the third ventricle (coronal image 31 series 4), consistent with a colloid cyst. No hydrocephalus or extra-axial collection. No mass effect or midline shift. Vascular: No hyperdense vessel or unexpected calcification. Skull: No calvarial fracture or suspicious bone lesion. Skull base is unremarkable. Sinuses/Orbits: No acute finding. Other: None. IMPRESSION: 1. No acute intracranial abnormality. 2. 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 No hydrocephalus. 3. Unchanged background of moderate chronic small-vessel disease. Electronically Signed   By: Audra Blend M.D.   On: 12/01/2023 18:31   DG Chest Port 1 View Result Date: 11/16/2023 CLINICAL DATA:  Hypoxia. EXAM: PORTABLE CHEST 1 VIEW COMPARISON:  09/17/2021. FINDINGS: Low lung volume. Bilateral lung fields are clear. Bilateral costophrenic angles are clear. Stable cardio-mediastinal silhouette. Moderate sized retrocardiac hiatal hernia noted. No acute osseous abnormalities. The soft tissues are within normal limits. IMPRESSION: No active disease. Electronically Signed   By: Beula Brunswick M.D.   On: 11/16/2023 15:51   ECHOCARDIOGRAM COMPLETE Result Date: 11/16/2023    ECHOCARDIOGRAM REPORT   Patient Name:   KEYSHIA KEARN Date of Exam: 11/16/2023 Medical Rec #:  [id number]         Height:       67.0 in Accession #:    [id number]        Weight:       227.0 lb Date  of Birth:  04/28/1941        BSA:          2.134 m Patient Age:    82 years          BP:           149/62 mmHg Patient Gender: F                 HR:           73 bpm. Exam Location:  [facility] Procedure: 2D Echo, Cardiac Doppler, Color Doppler and Strain Analysis (Both            Spectral and Color Flow Doppler were utilized during procedure). Indications:     Preoperative evaluation  History:         Patient has prior history of Echocardiogram examinations, most  recent 07/31/2018. CHF, Abnormal ECG, COPD; Risk                  Factors:Hypertension and Dyslipidemia. Known PFO, Dementia,                  CKD.  Sonographer:     Clarke Crouch Referring Phys:  [phone] CHRISTOPHER END Diagnosing Phys: Antionette Kirks MD  Sonographer Comments: Global longitudinal strain was attempted. IMPRESSIONS  1. Left ventricular ejection fraction, by estimation, is 55 to 60%. The left ventricle has normal function. The left ventricle has no regional wall motion abnormalities. There is moderate left ventricular hypertrophy. Left ventricular diastolic parameters are consistent with Grade I diastolic dysfunction (impaired relaxation). The average left ventricular global longitudinal strain is -15.4 %. The global longitudinal strain is normal.  2. Right ventricular systolic function is normal. The right ventricular size is normal. There is normal pulmonary artery systolic pressure.  3. Left atrial size was moderately dilated.  4. Right atrial size was mildly dilated.  5. The mitral valve is normal in structure. Mild mitral valve regurgitation. No evidence of mitral stenosis.  6. The aortic valve is normal in structure. Aortic valve regurgitation is trivial. Aortic valve sclerosis/calcification is present, without any evidence of aortic stenosis.  7. The inferior vena cava is normal in size with greater than 50% respiratory variability, suggesting right atrial pressure of 3 mmHg. FINDINGS  Left Ventricle: Left ventricular  ejection fraction, by estimation, is 55 to 60%. The left ventricle has normal function. The left ventricle has no regional wall motion abnormalities. The average left ventricular global longitudinal strain is -15.4 %. Strain was performed and the global longitudinal strain is normal. The left ventricular internal cavity size was normal in size. There is moderate left ventricular hypertrophy. Left ventricular diastolic parameters are consistent with Grade I diastolic dysfunction (impaired relaxation). Right Ventricle: The right ventricular size is normal. No increase in right ventricular wall thickness. Right ventricular systolic function is normal. There is normal pulmonary artery systolic pressure. The tricuspid regurgitant velocity is 2.57 m/s, and  with an assumed right atrial pressure of 3 mmHg, the estimated right ventricular systolic pressure is 29.4 mmHg. Left Atrium: Left atrial size was moderately dilated. Right Atrium: Right atrial size was mildly dilated. Pericardium: Trivial pericardial effusion is present. Mitral Valve: The mitral valve is normal in structure. There is mild thickening of the mitral valve leaflet(s). There is mild calcification of the mitral valve leaflet(s). Mild mitral valve regurgitation. No evidence of mitral valve stenosis. MV peak gradient, 7.2 mmHg. The mean mitral valve gradient is 2.0 mmHg. Tricuspid Valve: The tricuspid valve is normal in structure. Tricuspid valve regurgitation is trivial. No evidence of tricuspid stenosis. Aortic Valve: The aortic valve is normal in structure. Aortic valve regurgitation is trivial. Aortic valve sclerosis/calcification is present, without any evidence of aortic stenosis. Aortic valve mean gradient measures 4.0 mmHg. Aortic valve peak gradient measures 9.1 mmHg. Aortic valve area, by VTI measures 3.16 cm. Pulmonic Valve: The pulmonic valve was normal in structure. Pulmonic valve regurgitation is mild. No evidence of pulmonic stenosis. Aorta:  The aortic root is normal in size and structure. Venous: The inferior vena cava is normal in size with greater than 50% respiratory variability, suggesting right atrial pressure of 3 mmHg. IAS/Shunts: No atrial level shunt detected by color flow Doppler.  LEFT VENTRICLE PLAX 2D LVIDd:         5.40 cm     Diastology LVIDs:  3.40 cm     LV e' medial:    6.09 cm/s LV PW:         1.30 cm     LV E/e' medial:  13.3 LV IVS:        1.40 cm     LV e' lateral:   6.96 cm/s LVOT diam:     2.00 cm     LV E/e' lateral: 11.6 LV SV:         109 LV SV Index:   51          2D Longitudinal Strain LVOT Area:     3.14 cm    2D Strain GLS Avg:     -15.4 %  LV Volumes (MOD) LV vol d, MOD A2C: 72.9 ml LV vol d, MOD A4C: 85.6 ml LV vol s, MOD A2C: 38.5 ml LV vol s, MOD A4C: 32.7 ml LV SV MOD A2C:     34.4 ml LV SV MOD A4C:     85.6 ml LV SV MOD BP:      44.1 ml RIGHT VENTRICLE RV Basal diam:  3.85 cm RV Mid diam:    3.40 cm RV S prime:     14.50 cm/s TAPSE (M-mode): 2.4 cm LEFT ATRIUM              Index        RIGHT ATRIUM           Index LA diam:        4.30 cm  2.01 cm/m   RA Area:     19.50 cm LA Vol (A2C):   110.0 ml 51.54 ml/m  RA Volume:   57.60 ml  26.99 ml/m LA Vol (A4C):   73.8 ml  34.58 ml/m LA Biplane Vol: 92.8 ml  43.48 ml/m  AORTIC VALVE                    PULMONIC VALVE AV Area (Vmax):    2.81 cm     PV Vmax:       1.10 m/s AV Area (Vmean):   2.79 cm     PV Peak grad:  4.8 mmHg AV Area (VTI):     3.16 cm AV Vmax:           151.00 cm/s AV Vmean:          93.400 cm/s AV VTI:            0.344 m AV Peak Grad:      9.1 mmHg AV Mean Grad:      4.0 mmHg LVOT Vmax:         135.00 cm/s LVOT Vmean:        82.900 cm/s LVOT VTI:          0.346 m LVOT/AV VTI ratio: 1.01  AORTA Ao Root diam: 3.40 cm Ao Asc diam:  3.30 cm MITRAL VALVE                TRICUSPID VALVE MV Area (PHT): 3.83 cm     TR Peak grad:   26.4 mmHg MV Area VTI:   2.74 cm     TR Vmax:        257.00 cm/s MV Peak grad:  7.2 mmHg MV Mean grad:  2.0 mmHg      SHUNTS MV Vmax:       1.34 m/s     Systemic VTI:  0.35 m MV Vmean:  66.3 cm/s    Systemic Diam: 2.00 cm MV Decel Time: 198 msec MV E velocity: 80.70 cm/s MV A velocity: 125.00 cm/s MV E/A ratio:  0.65 Antionette Kirks MD Electronically signed by Antionette Kirks MD Signature Date/Time: 11/16/2023/12:42:17 PM    Final     Microbiology: Results for orders placed or performed during the hospital encounter of 12/01/23  Urine Culture (for pregnant, neutropenic or urologic patients or patients with an indwelling urinary catheter)     Status: Abnormal   Collection Time: 12/01/23  9:44 PM   Specimen: Urine, Clean Catch  Result Value Ref Range Status   Specimen Description   Final    URINE, CLEAN CATCH Performed at [facility], [address] [place], [place] [address]    Special Requests   Final    NONE Performed at [facility], [address] [place], [place] [address]    Culture >=100,000 COLONIES/mL ESCHERICHIA COLI (A)  Final   Report Status 12/04/2023 FINAL  Final   Organism ID, Bacteria ESCHERICHIA COLI (A)  Final      Susceptibility   Escherichia coli - MIC*    AMPICILLIN >=32 RESISTANT Resistant     CEFAZOLIN  <=4 SENSITIVE Sensitive     CEFEPIME <=0.12 SENSITIVE Sensitive     CEFTRIAXONE  <=0.25 SENSITIVE Sensitive     CIPROFLOXACIN <=0.25 SENSITIVE Sensitive     GENTAMICIN <=1 SENSITIVE Sensitive     IMIPENEM <=0.25 SENSITIVE Sensitive     NITROFURANTOIN  <=16 SENSITIVE Sensitive     TRIMETH/SULFA <=20 SENSITIVE Sensitive     AMPICILLIN/SULBACTAM 16 INTERMEDIATE Intermediate     PIP/TAZO <=4 SENSITIVE Sensitive ug/mL    * >=100,000 COLONIES/mL ESCHERICHIA COLI    Labs: CBC: Recent Labs  Lab 12/08/23 0411 12/09/23 0500 12/10/23 0454 12/11/23 0549 12/12/23 0443 12/13/23 0450 12/14/23 0439  WBC 7.5 9.6 5.9 7.0 6.9 6.6 6.4  NEUTROABS 4.5 7.2 3.0 3.8 4.2  --   --   HGB 9.2* 8.8* 8.4* 8.9* 8.4* 8.5* 8.2*  HCT 30.2* 27.8* 27.4* 29.7* 27.0* 27.4* 25.6*   MCV 80.3 79.0* 82.3 83.0 82.1 81.5 81.0  PLT 206 194 189 194 193 196 197   Basic Metabolic Panel: Recent Labs  Lab 12/08/23 0411 12/09/23 0500 12/10/23 0454 12/11/23 0549 12/12/23 0443 12/13/23 1044  NA 140 141 140 141 140 139  K 3.7 3.9 3.6 3.7 3.4* 3.7  CL 111 111 108 112* 110 109  CO2 22 23 23 24 24 23   GLUCOSE 117* 94 79 92 88 137*  BUN 22 26* 21 17 11 9   CREATININE 1.43* 1.14* 1.00 1.09* 0.84 0.89  CALCIUM  8.5* 8.4* 8.4* 8.6* 9.0 8.9  MG 1.7  --   --   --   --   --    Liver Function Tests: Recent Labs  Lab 12/08/23 0411  AST 18  ALT 18  ALKPHOS 50  BILITOT 0.6  PROT 5.7*  ALBUMIN  2.9*   CBG: Recent Labs  Lab 12/08/23 0423 12/09/23 0533 12/10/23 0533 12/12/23 0451 12/14/23 0508  GLUCAP 115* 87 80 82 90    Discharge time spent: less than 30 minutes.  Signed: Montey Apa, DO [facility] 12/14/2023

## 2023-12-14 NOTE — TOC Transition Note (Signed)
 Transition of Care [facility]) - Discharge Note   Patient Details  Name: Virginia Walters MRN: [id number] Date of Birth: 03-May-1941  [facility] CM/SW Contact:  Loman Risk, RN Phone Number: 12/14/2023, 3:00 PM   Clinical Narrative:    Patient will DC to:[facility] Anticipated DC date:12/14/23  Family notified: daughter Corbin Dess by:[facility]  Per MD patient ready for DC to . RN, , patient's family, and facility notified of DC. Discharge Summary sent to facility. RN given number for report. DC packet on chart. TOC signing off.          Patient Goals and CMS Choice            Discharge Placement                       Discharge Plan and Services Additional resources added to the After Visit Summary for                                       Social Drivers of Health (SDOH) Interventions SDOH Screenings   Food Insecurity: No Food Insecurity (12/02/2023)  Housing: Low Risk  (12/02/2023)  Transportation Needs: No Transportation Needs (12/02/2023)  Utilities: Not At Risk (12/02/2023)  Alcohol Screen: Low Risk  (05/25/2022)  Depression (PHQ2-9): Low Risk  (11/13/2023)  Financial Resource Strain: Low Risk  (05/25/2022)  Physical Activity: Inactive (05/25/2022)  Social Connections: Moderately Integrated (12/02/2023)  Stress: No Stress Concern Present (05/25/2022)  Tobacco Use: Medium Risk (12/06/2023)     Readmission Risk Interventions    09/20/2021   12:40 PM  Readmission Risk Prevention Plan  Transportation Screening Complete  PCP or Specialist Appt within 5-7 Days Complete  Home Care Screening Complete  Medication Review (RN CM) Complete

## 2023-12-14 NOTE — Progress Notes (Signed)
 Mobility Specialist - Progress Note   12/14/23 1002  Mobility  Activity Stood at bedside;Ambulated with assistance in room;Transferred from chair to bed  Level of Assistance Standby assist, set-up cues, supervision of patient - no hands on  Assistive Device Front wheel walker  Distance Ambulated (ft) 4 ft  Activity Response Tolerated well  Mobility visit 1 Mobility  Mobility Specialist Start Time (ACUTE ONLY) 0946  Mobility Specialist Stop Time (ACUTE ONLY) 1001  Mobility Specialist Time Calculation (min) (ACUTE ONLY) 15 min   MS responding to chair alarm, Pt requesting to transfer to bed. Pt remained seated in the recliner while MS doffed gown and completes wash up. Pt dons paper scrub top with MinA, STS to RW CGA-MinG dons brief and paper scrub pants with MaxA. Pt transfers to the recliner with supervision, taking slow steps towards the [facility]. Pt left in fowler position with alarm set and needs within reach.   Versa Gore Mobility Specialist 12/14/23 10:12 AM

## 2023-12-14 NOTE — Progress Notes (Signed)
 Progress Note    12/14/2023 2:46 PM 3 Days Post-Op  Subjective:  Virginia Walters is an 83 yo female now POD #1 from Pulmonary thrombectomy.  Patient is resting comfortably sitting up in bed this morning.  Patient endorses she feels like she is breathing better.  Confusion is much less this morning as she answered all my questions appropriately.  She follows commands appropriately this morning as well.  She has been weaned down to 2 L nasal cannula oxygen  but overall looks better.  No complaints overnight.  Vitals all remained stable.    Review of Systems  Constitutional:  Constitutional negative. Eyes: Eyes negative.  Respiratory: Positive for shortness of breath and wheezing.  Cardiovascular: Cardiovascular negative.  GI: Gastrointestinal negative.  GU: Genitourinary negative. Musculoskeletal: Musculoskeletal negative.  Skin: Skin negative.  Neurological: Neurological negative. Psychiatric: Psychiatric negative.  All other systems reviewed and are negative     Vitals:   12/14/23 0509 12/14/23 0852  BP: (!) 156/84 (!) 142/69  Pulse: 79 82  Resp: 20 18  Temp: 98.2 F (36.8 C) 97.9 F (36.6 C)  SpO2: 96% 93%   Physical Exam: Cardiac:  RRR remains Tachycardic in 110-119.  No rubs clicks gallops or murmurs noted. Lungs: Slightly labored breathing this morning.  Scattered rhonchi and wheezing without rales.  Patient remains on 3 L nasal cannula oxygen  for saturations 97 to 100%. Incisions: Right femoral vein.  Dressing clean dry and intact no hematoma seroma to note. Extremities: Palpable pulses throughout all extremities.  All extremities are warm to touch. Abdomen: Positive bowel sounds throughout, soft, nontender and nondistended. Neurologic: AAO x 3 this morning.  Patient answers all my questions and follows commands appropriately.  Much less encephalopathy and confusion noted today.  CBC    Component Value Date/Time   WBC 6.4 12/14/2023 0439   RBC 3.16 (L) 12/14/2023  0439   HGB 8.2 (L) 12/14/2023 0439   HCT 25.6 (L) 12/14/2023 0439   PLT 197 12/14/2023 0439   MCV 81.0 12/14/2023 0439   MCH 25.9 (L) 12/14/2023 0439   MCHC 32.0 12/14/2023 0439   RDW 22.6 (H) 12/14/2023 0439   LYMPHSABS 1.1 12/12/2023 0443   MONOABS 0.6 12/12/2023 0443   EOSABS 0.8 (H) 12/12/2023 0443   BASOSABS 0.1 12/12/2023 0443    BMET    Component Value Date/Time   NA 139 12/13/2023 1044   K 3.7 12/13/2023 1044   K 4.2 07/22/2013 1325   CL 109 12/13/2023 1044   CO2 23 12/13/2023 1044   GLUCOSE 137 (H) 12/13/2023 1044   BUN 9 12/13/2023 1044   BUN 26 (A) 08/24/2009 0000   CREATININE 0.89 12/13/2023 1044   CREATININE 1.12 (H) 11/13/2023 0939   CALCIUM  8.9 12/13/2023 1044   GFRNONAA >60 12/13/2023 1044   GFRNONAA 39 (L) 11/13/2020 1438   GFRAA 45 (L) 11/13/2020 1438    INR    Component Value Date/Time   INR 1.4 (H) 09/17/2021 0940     Intake/Output Summary (Last 24 hours) at 12/14/2023 1446 Last data filed at 12/14/2023 0900 Gross per 24 hour  Intake 240 ml  Output 600 ml  Net -360 ml     Assessment/Plan:  83 y.o. female is s/p pulmonary thrombectomy 3 Days Post-Op patient appears to be improving daily.  Patient did work with physical therapy and was able to ambulate 4 feet with walker today.  Plan Daily Eliquis  5 mg twice daily with no loading dose. Regular diet. OOB to bedside chair with  assistance. Continue physical therapy and Occupational Therapy. Avoid all narcotics due to prior encephalopathy and confusion.  Tylenol  for pain. Wean off oxygen  as tolerated.   DVT prophylaxis: Eliquis  5 mg Twice daily    [facility]nam[facility] 12/14/2023 2:46 PM

## 2023-12-14 NOTE — TOC Progression Note (Signed)
 Transition of Care [facility]) - Progression Note    Patient Details  Name: Virginia Walters MRN: [id number] Date of Birth: 1941-05-14  [facility] CM/SW Contact  Loman Risk, RN Phone Number: 12/14/2023, 9:43 AM  Clinical Narrative:      VM left for daughter to discuss discharge       Expected Discharge Plan and Services         Expected Discharge Date: 12/13/23                                     Social Determinants of Health (SDOH) Interventions SDOH Screenings   Food Insecurity: No Food Insecurity (12/02/2023)  Housing: Low Risk  (12/02/2023)  Transportation Needs: No Transportation Needs (12/02/2023)  Utilities: Not At Risk (12/02/2023)  Alcohol Screen: Low Risk  (05/25/2022)  Depression (PHQ2-9): Low Risk  (11/13/2023)  Financial Resource Strain: Low Risk  (05/25/2022)  Physical Activity: Inactive (05/25/2022)  Social Connections: Moderately Integrated (12/02/2023)  Stress: No Stress Concern Present (05/25/2022)  Tobacco Use: Medium Risk (12/06/2023)    Readmission Risk Interventions    09/20/2021   12:40 PM  Readmission Risk Prevention Plan  Transportation Screening Complete  PCP or Specialist Appt within 5-7 Days Complete  Home Care Screening Complete  Medication Review (RN CM) Complete

## 2023-12-15 ENCOUNTER — Inpatient Hospital Stay: Admitting: Internal Medicine

## 2023-12-15 ENCOUNTER — Inpatient Hospital Stay

## 2023-12-18 ENCOUNTER — Inpatient Hospital Stay

## 2023-12-18 ENCOUNTER — Inpatient Hospital Stay: Admitting: Internal Medicine

## 2023-12-19 DIAGNOSIS — Z7189 Other specified counseling: Secondary | ICD-10-CM | POA: Diagnosis not present

## 2023-12-19 DIAGNOSIS — D649 Anemia, unspecified: Secondary | ICD-10-CM | POA: Diagnosis not present

## 2023-12-20 DIAGNOSIS — I2699 Other pulmonary embolism without acute cor pulmonale: Secondary | ICD-10-CM | POA: Diagnosis not present

## 2023-12-20 DIAGNOSIS — I10 Essential (primary) hypertension: Secondary | ICD-10-CM | POA: Diagnosis not present

## 2023-12-20 DIAGNOSIS — G9341 Metabolic encephalopathy: Secondary | ICD-10-CM | POA: Diagnosis not present

## 2023-12-20 DIAGNOSIS — N390 Urinary tract infection, site not specified: Secondary | ICD-10-CM | POA: Diagnosis not present

## 2023-12-20 DIAGNOSIS — J449 Chronic obstructive pulmonary disease, unspecified: Secondary | ICD-10-CM | POA: Diagnosis not present

## 2023-12-20 DIAGNOSIS — D649 Anemia, unspecified: Secondary | ICD-10-CM | POA: Diagnosis not present

## 2023-12-22 ENCOUNTER — Encounter: Payer: Self-pay | Admitting: Internal Medicine

## 2023-12-22 ENCOUNTER — Inpatient Hospital Stay: Attending: Internal Medicine

## 2023-12-22 ENCOUNTER — Inpatient Hospital Stay (HOSPITAL_BASED_OUTPATIENT_CLINIC_OR_DEPARTMENT_OTHER): Admitting: Internal Medicine

## 2023-12-22 ENCOUNTER — Inpatient Hospital Stay

## 2023-12-22 VITALS — BP 142/74 | HR 75 | Temp 97.0°F | Resp 17

## 2023-12-22 DIAGNOSIS — K5520 Angiodysplasia of colon without hemorrhage: Secondary | ICD-10-CM | POA: Diagnosis not present

## 2023-12-22 DIAGNOSIS — D649 Anemia, unspecified: Secondary | ICD-10-CM

## 2023-12-22 DIAGNOSIS — Z87891 Personal history of nicotine dependence: Secondary | ICD-10-CM | POA: Diagnosis not present

## 2023-12-22 DIAGNOSIS — D500 Iron deficiency anemia secondary to blood loss (chronic): Secondary | ICD-10-CM | POA: Diagnosis not present

## 2023-12-22 LAB — CBC WITH DIFFERENTIAL (CANCER CENTER ONLY)
Abs Immature Granulocytes: 0.02 10*3/uL (ref 0.00–0.07)
Basophils Absolute: 0.1 10*3/uL (ref 0.0–0.1)
Basophils Relative: 1 %
Eosinophils Absolute: 1.1 10*3/uL — ABNORMAL HIGH (ref 0.0–0.5)
Eosinophils Relative: 19 %
HCT: 28.8 % — ABNORMAL LOW (ref 36.0–46.0)
Hemoglobin: 8.7 g/dL — ABNORMAL LOW (ref 12.0–15.0)
Immature Granulocytes: 0 %
Lymphocytes Relative: 15 %
Lymphs Abs: 0.9 10*3/uL (ref 0.7–4.0)
MCH: 25.2 pg — ABNORMAL LOW (ref 26.0–34.0)
MCHC: 30.2 g/dL (ref 30.0–36.0)
MCV: 83.5 fL (ref 80.0–100.0)
Monocytes Absolute: 0.6 10*3/uL (ref 0.1–1.0)
Monocytes Relative: 10 %
Neutro Abs: 3.4 10*3/uL (ref 1.7–7.7)
Neutrophils Relative %: 55 %
Platelet Count: 240 10*3/uL (ref 150–400)
RBC: 3.45 MIL/uL — ABNORMAL LOW (ref 3.87–5.11)
RDW: 22.7 % — ABNORMAL HIGH (ref 11.5–15.5)
WBC Count: 6.1 10*3/uL (ref 4.0–10.5)
nRBC: 0 % (ref 0.0–0.2)

## 2023-12-22 LAB — CMP (CANCER CENTER ONLY)
ALT: 20 U/L (ref 0–44)
AST: 19 U/L (ref 15–41)
Albumin: 3.1 g/dL — ABNORMAL LOW (ref 3.5–5.0)
Alkaline Phosphatase: 50 U/L (ref 38–126)
Anion gap: 8 (ref 5–15)
BUN: 16 mg/dL (ref 8–23)
CO2: 23 mmol/L (ref 22–32)
Calcium: 8.5 mg/dL — ABNORMAL LOW (ref 8.9–10.3)
Chloride: 108 mmol/L (ref 98–111)
Creatinine: 0.92 mg/dL (ref 0.44–1.00)
GFR, Estimated: >60 mL/min (ref >60)
Glucose, Bld: 109 mg/dL — ABNORMAL HIGH (ref 70–99)
Potassium: 3.4 mmol/L — ABNORMAL LOW (ref 3.5–5.1)
Sodium: 139 mmol/L (ref 135–145)
Total Bilirubin: 0.5 mg/dL (ref 0.0–1.2)
Total Protein: 6.2 g/dL — ABNORMAL LOW (ref 6.5–8.1)

## 2023-12-22 LAB — LACTATE DEHYDROGENASE: LDH: 145 U/L (ref 98–192)

## 2023-12-22 MED ORDER — SODIUM CHLORIDE 0.9% FLUSH
10.0000 mL | Freq: Once | INTRAVENOUS | Status: DC | PRN
  Filled 2023-12-22: qty 10

## 2023-12-22 MED ORDER — IRON SUCROSE 20 MG/ML IV SOLN
200.0000 mg | Freq: Once | INTRAVENOUS | Status: AC
  Administered 2023-12-22: 200 mg via INTRAVENOUS

## 2023-12-22 NOTE — Progress Notes (Signed)
 [facility] CONSULT NOTE  Patient Care Team: Austine Lefort, MD as PCP - General (Family Medicine) Charlette Console, FNP as Nurse Practitioner (Family Medicine) Florette Hurry, NP as Nurse Practitioner (Cardiology) Myrle Aspen, [facility] (Inactive) as Pharmacist (Pharmacist) Gwyn Leos, MD as Consulting Physician (Oncology)  CHIEF COMPLAINTS/PURPOSE OF CONSULTATION: Anemia.    Latest Reference Range & Units 11/14/23 08:58  Iron  28 - 170 ug/dL 9 (L)  UIBC ug/dL 161  TIBC 096 - 045 ug/dL 409 (H)  Saturation Ratios 10.4 - 31.8 % 2 (L)  Ferritin 11 - 307 ng/mL 2 (L)  Folate >5.9 ng/mL >40.0  (L): Data is abnormally low (H): Data is abnormally high  HISTORY OF PRESENTING ILLNESS: Patient ambulating-in wheel chair with assistance.  Alone/Accompanied by care giver- [facility]  Virginia Walters 83 y.o.  female pleasant patient with a multiple medical problems including CHF COPD-and with severe anemia is here for follow-up.  Recently patient on admission noted to have hemoglobin of 5.1.  Patient s/p units of PRBC transfusion hemoglobin improved to 9.   Of note patient had similar episode of severe anemia in 2021-an EGD and colonoscopy was done.  Patient was noted to have AVMs at the time.  Patient status post evaluation with GI-plan outpatient endoscopies.   Patient complains of shortness of breath with exertion.  Also complains of excessive fatigue.  Complains of dizziness especially on standing.     Review of Systems  Constitutional:  Positive for malaise/fatigue. Negative for chills, diaphoresis, fever and weight loss.  HENT:  Negative for nosebleeds and sore throat.   Eyes:  Negative for double vision.  Respiratory:  Negative for cough, hemoptysis, sputum production, shortness of breath and wheezing.   Cardiovascular:  Negative for chest pain, palpitations, orthopnea and leg swelling.  Gastrointestinal:  Negative for abdominal pain, blood  in stool, constipation, diarrhea, heartburn, melena, nausea and vomiting.  Genitourinary:  Negative for dysuria, frequency and urgency.  Musculoskeletal:  Positive for back pain and joint pain.  Skin: Negative.  Negative for itching and rash.  Neurological:  Positive for weakness. Negative for dizziness, tingling, focal weakness and headaches.  Endo/Heme/Allergies:  Does not bruise/bleed easily.  Psychiatric/Behavioral:  Negative for depression. The patient is not nervous/anxious and does not have insomnia.     MEDICAL HISTORY:  Past Medical History:  Diagnosis Date   (HFpEF) heart failure with preserved ejection fraction (HCC)    a.) TTE 07/22/2018: EF 45-50%; mild LVH; mild diffuse HK; mild LA dilation; PASP 30-35 mmHg; G1DD.   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   Weakness     SURGICAL HISTORY: Past Surgical History:  Procedure Laterality Date   ANKLE ARTHROSCOPY WITH OPEN REDUCTION INTERNAL FIXATION (ORIF) Right    APPENDECTOMY     CHOLECYSTECTOMY     COLONOSCOPY  N/A 07/21/2018   Procedure: COLONOSCOPY;  Surgeon: Selena Daily, MD;  Location: [facility] ENDOSCOPY;  Service: Gastroenterology;  Laterality: N/A;   COLONOSCOPY N/A 11/20/2023   Procedure: COLONOSCOPY;  Surgeon: Marnee Sink, MD;  Location: [facility] ENDOSCOPY;  Service: Endoscopy;  Laterality: N/A;   COLONOSCOPY N/A 11/27/2023   Procedure: COLONOSCOPY;  Surgeon: Marnee Sink, MD;  Location: [facility] ENDOSCOPY;  Service: Endoscopy;  Laterality: N/A;   COLONOSCOPY N/A 12/06/2023   Procedure: COLONOSCOPY;  Surgeon: Marnee Sink, MD;  Location: [facility] ENDOSCOPY;  Service: Endoscopy;  Laterality: N/A;   COLONOSCOPY WITH PROPOFOL  N/A 04/01/2020   Procedure: COLONOSCOPY WITH PROPOFOL ;  Surgeon: Irby Mannan, MD;  Location: [facility] ENDOSCOPY;  Service: Endoscopy;  Laterality: N/A;   ESOPHAGOGASTRODUODENOSCOPY N/A 07/21/2018   Procedure: ESOPHAGOGASTRODUODENOSCOPY (EGD);  Surgeon: Selena Daily, MD;  Location: [facility] ENDOSCOPY;  Service: Gastroenterology;  Laterality: N/A;   ESOPHAGOGASTRODUODENOSCOPY N/A 02/11/2020   Procedure: ESOPHAGOGASTRODUODENOSCOPY (EGD);  Surgeon: Irby Mannan, MD;  Location: [facility] ENDOSCOPY;  Service: Endoscopy;  Laterality: N/A;   ESOPHAGOGASTRODUODENOSCOPY N/A 11/20/2023   Procedure: EGD (ESOPHAGOGASTRODUODENOSCOPY);  Surgeon: Marnee Sink, MD;  Location: [facility] ENDOSCOPY;  Service: Endoscopy;  Laterality: N/A;   ESOPHAGOGASTRODUODENOSCOPY N/A 11/27/2023   Procedure: EGD (ESOPHAGOGASTRODUODENOSCOPY);  Surgeon: Marnee Sink, MD;  Location: [facility] ENDOSCOPY;  Service: Endoscopy;  Laterality: N/A;   ESOPHAGOGASTRODUODENOSCOPY N/A 12/06/2023   Procedure: EGD (ESOPHAGOGASTRODUODENOSCOPY);  Surgeon: Marnee Sink, MD;  Location: [facility] ENDOSCOPY;  Service: Endoscopy;  Laterality: N/A;   ESOPHAGOGASTRODUODENOSCOPY (EGD) WITH PROPOFOL  N/A 04/01/2020   Procedure: ESOPHAGOGASTRODUODENOSCOPY (EGD) WITH PROPOFOL ;  Surgeon: Irby Mannan, MD;  Location: [facility] ENDOSCOPY;  Service: Endoscopy;   Laterality: N/A;   FRACTURE SURGERY Left    leg fx   GIVENS CAPSULE STUDY  12/06/2023   Procedure: IMAGING PROCEDURE, GI TRACT, INTRALUMINAL, VIA CAPSULE;  Surgeon: Marnee Sink, MD;  Location: [facility] ENDOSCOPY;  Service: Endoscopy;;   IR EXCHANGE BILIARY DRAIN  10/22/2021   IR PERC CHOLECYSTOSTOMY  09/17/2021   PULMONARY THROMBECTOMY N/A 12/11/2023   Procedure: PULMONARY THROMBECTOMY;  Surgeon: Jackquelyn Mass, MD;  Location: [facility] INVASIVE CV LAB;  Service: Cardiovascular;  Laterality: N/A;   TUBAL LIGATION      SOCIAL HISTORY: Social History   Socioeconomic History   Marital status: Widowed    Spouse name: Not on file   Number of children: 1   Years of education: HS   Highest education level: Not on file  Occupational History   Occupation: Retired  Tobacco Use   Smoking status: Former    Current packs/day: 0.00    Average packs/day: 1 pack/day for 15.0 years (15.0 ttl pk-yrs)    Types: Cigarettes    Start date: 12/27/1965    Quit date: 12/27/1980    Years since quitting: 43.0   Smokeless tobacco: Never  Vaping Use   Vaping status: Never Used  Substance and Sexual Activity   Alcohol use: Yes    Alcohol/week: 1.0 standard drink of alcohol    Types: 1 Glasses of wine per week   Drug use: No   Sexual activity: Not Currently  Other Topics  Concern   Not on file  Social History Narrative   Lives at home alone. Husband passed away 11-Jan-2013.   Right-handed.   No daily use of caffeine.   Daughter lives in [place] .   Social Drivers of Corporate investment banker Strain: Low Risk  (05/25/2022)   Overall Financial Resource Strain (CARDIA)    Difficulty of Paying Living Expenses: Not hard at all  Food Insecurity: No Food Insecurity (12/02/2023)   Hunger Vital Sign    Worried About Running Out of Food in the Last Year: Never true    Ran Out of Food in the Last Year: Never true  Transportation Needs: No Transportation Needs (12/02/2023)   PRAPARE - Scientist, research (physical sciences) (Medical): No    Lack of Transportation (Non-Medical): No  Physical Activity: Inactive (05/25/2022)   Exercise Vital Sign    Days of Exercise per Week: 0 days    Minutes of Exercise per Session: 0 min  Stress: No Stress Concern Present (05/25/2022)   [employer] of Occupational Health - Occupational Stress Questionnaire    Feeling of Stress : Not at all  Social Connections: Moderately Integrated (12/02/2023)   Social Connection and Isolation Panel [NHANES]    Frequency of Communication with Friends and Family: More than three times a week    Frequency of Social Gatherings with Friends and Family: More than three times a week    Attends Religious Services: More than 4 times per year    Active Member of [employer] or Organizations: Yes    Attends Banker Meetings: More than 4 times per year    Marital Status: Widowed  Intimate Partner Violence: Not At Risk (12/02/2023)   Humiliation, Afraid, Rape, and Kick questionnaire    Fear of Current or Ex-Partner: No    Emotionally Abused: No    Physically Abused: No    Sexually Abused: No    FAMILY HISTORY: Family History  Problem Relation Age of Onset   Esophageal cancer Father    Dementia Mother     ALLERGIES:  is allergic to ibuprofen  and oxycodone .  MEDICATIONS:  Current Outpatient Medications  Medication Sig Dispense Refill   acetaminophen  (TYLENOL ) 325 MG tablet Take 2 tablets (650 mg total) by mouth every 6 (six) hours as needed for mild pain (pain score 1-3), fever or headache (or Fever >/= 101).     albuterol  (VENTOLIN  HFA) 108 (90 Base) MCG/ACT inhaler Inhale 2 puffs into the lungs every 6 (six) hours as needed for wheezing or shortness of breath.     amLODipine  (NORVASC ) 5 MG tablet Take 1 tablet (5 mg total) by mouth daily.     apixaban  (ELIQUIS ) 5 MG TABS tablet Take 1 tablet (5 mg total) by mouth 2 (two) times daily.     Ascorbic Acid  (VITAMIN C  PO) Take 1 tablet by mouth daily.     BREZTRI   AEROSPHERE 160-9-4.8 MCG/ACT AERO USE 2 INHALATIONS BY MOUTH INTO  THE LUNGS IN THE MORNING AND AT  BEDTIME 32.1 g 3   dorzolamide  (TRUSOPT ) 2 % ophthalmic solution Place 1 drop into the left eye 2 (two) times daily.     doxazosin  (CARDURA ) 4 MG tablet TAKE ONE-HALF TABLET BY MOUTH  DAILY 50 tablet 2   fish oil-omega-3 fatty acids  1000 MG capsule Take 1,000 mg by mouth daily.     lisinopril  (ZESTRIL ) 40 MG tablet TAKE 1 TABLET BY MOUTH DAILY 100 tablet 2   Multiple Vitamin (MULTIVITAMIN) tablet  Take 1 tablet by mouth daily.     pravastatin  (PRAVACHOL ) 40 MG tablet Take 1 tablet (40 mg total) by mouth daily. 09/29/23-PT NEEDS APPOINTMENT 30 tablet 0   solifenacin  (VESICARE ) 10 MG tablet Take 1 tablet (10 mg total) by mouth daily. 90 tablet 3   thiamine  (VITAMIN B-1) 100 MG tablet Take 1 tablet (100 mg total) by mouth daily.     traZODone  (DESYREL ) 50 MG tablet TAKE 1 TABLET BY MOUTH AT  BEDTIME 90 tablet 3   feeding supplement (ENSURE SURGERY) LIQD Take 237 mLs by mouth 2 (two) times daily between meals.     pantoprazole  (PROTONIX ) 40 MG tablet Take 1 tablet (40 mg total) by mouth daily. 30 tablet 0   No current facility-administered medications for this visit.   Facility-Administered Medications Ordered in Other Visits  Medication Dose Route Frequency Provider Last Rate Last Admin   sodium chloride  flush (NS) 0.9 % injection 10 mL  10 mL Intracatheter Once PRN Alto Gandolfo R, MD        PHYSICAL EXAMINATION:   Vitals:   12/22/23 1042  BP: (!) 149/62  Pulse: 68  Resp: 16  Temp: 98 F (36.7 C)  SpO2: 95%   Filed Weights   12/22/23 1042  Weight: 220 lb 9.6 oz (100.1 kg)    Physical Exam Vitals and nursing note reviewed.  HENT:     Head: Normocephalic and atraumatic.     Mouth/Throat:     Pharynx: Oropharynx is clear.  Eyes:     Extraocular Movements: Extraocular movements intact.     Pupils: Pupils are equal, round, and reactive to light.  Cardiovascular:     Rate and  Rhythm: Normal rate and regular rhythm.  Pulmonary:     Comments: Decreased breath sounds bilaterally.  Abdominal:     Palpations: Abdomen is soft.  Musculoskeletal:        General: Normal range of motion.     Cervical back: Normal range of motion.  Skin:    General: Skin is warm.  Neurological:     General: No focal deficit present.     Mental Status: She is alert and oriented to person, place, and time.  Psychiatric:        Behavior: Behavior normal.        Judgment: Judgment normal.     LABORATORY DATA:  I have reviewed the data as listed Lab Results  Component Value Date   WBC 6.1 12/22/2023   HGB 8.7 (L) 12/22/2023   HCT 28.8 (L) 12/22/2023   MCV 83.5 12/22/2023   PLT 240 12/22/2023   Recent Labs    12/01/23 1746 12/02/23 0835 12/08/23 0411 12/09/23 0500 12/12/23 0443 12/13/23 1044 12/22/23 1037  NA 143   < > 140   < > 140 139 139  K 2.8*   < > 3.7   < > 3.4* 3.7 3.4*  CL 106   < > 111   < > 110 109 108  CO2 22   < > 22   < > 24 23 23   GLUCOSE 109*   < > 117*   < > 88 137* 109*  BUN 27*   < > 22   < > 11 9 16   CREATININE 3.28*   < > 1.43*   < > 0.84 0.89 0.92  CALCIUM  9.5   < > 8.5*   < > 9.0 8.9 8.5*  GFRNONAA 14*   < > 37*   < > >60 >  60 >60  PROT 7.1  --  5.7*  --   --   --  6.2*  ALBUMIN  3.9  --  2.9*  --   --   --  3.1*  AST 26  --  18  --   --   --  19  ALT 21  --  18  --   --   --  20  ALKPHOS 49  --  50  --   --   --  50  BILITOT 1.4*  --  0.6  --   --   --  0.5   < > = values in this interval not displayed.    RADIOGRAPHIC STUDIES: I have personally reviewed the radiological images as listed and agreed with the findings in the report. PERIPHERAL VASCULAR CATHETERIZATION Result Date: 12/12/2023 See surgical note for result.  CT Angio Chest Pulmonary Embolism (PE) W or WO Contrast Result Date: 12/08/2023 CLINICAL DATA:  Pulmonary embolism suspected, positive D-dimer EXAM: CT ANGIOGRAPHY CHEST WITH CONTRAST TECHNIQUE: Multidetector CT imaging of  the chest was performed using the standard protocol during bolus administration of intravenous contrast. Multiplanar CT image reconstructions and MIPs were obtained to evaluate the vascular anatomy. RADIATION DOSE REDUCTION: This exam was performed according to the departmental dose-optimization program which includes automated exposure control, adjustment of the mA and/or kV according to patient size and/or use of iterative reconstruction technique. CONTRAST:  75mL OMNIPAQUE  IOHEXOL  350 MG/ML SOLN COMPARISON:  None Available. FINDINGS: Cardiovascular: Satisfactory opacification of the pulmonary arteries to the segmental level. Multiple branching pulmonary artery filling defects seen in subsegmental vessels and segmental right upper lobe vessel. These are marked on series 4. The main pulmonary artery is dilated to 3.7 cm. No right heart dilatation when compared to the left. Minimal subendocardial fat deposition at the left ventricle which may be related to prior ischemia. Extensive atheromatous calcification of the aorta and coronaries. Mediastinum/Nodes: Negative for mass or adenopathy. Moderate hiatal hernia. Lungs/Pleura: Mild ground-glass opacity at the right base which could be atelectasis or small pulmonary infarct. Reticulation of lung markings with a few branching micro nodules, likely chronic lung disease and stable from 2019 chest CT. Scarring is greatest at the lung bases, no honeycombing. Upper Abdomen: No acute finding Musculoskeletal: Extensive spondylosis with multi-level bridging osteophyte Review of the MIP images confirms the above findings. Critical Value/emergent results were called by telephone at the time of interpretation on 12/08/2023 at 7:01 am to provider [facility] , who verbally acknowledged these results. IMPRESSION: Multiple segmental to subsegmental pulmonary emboli which appear acute. No indication of right heart strain ; there is pulmonary artery dilatation compatible with  chronic pulmonary hypertension. Atelectasis or small pulmonary infarct at the right lung base. Sliding hiatal hernia. Atherosclerosis including the coronary arteries. Electronically Signed   By: Ronnette Coke M.D.   On: 12/08/2023 07:01   DG Chest Port 1 View Result Date: 12/08/2023 CLINICAL DATA:  Hypoxia EXAM: PORTABLE CHEST 1 VIEW COMPARISON:  11/16/2023 FINDINGS: Cardiomegaly. Increased bronchitic changes and patchy ill-defined pulmonary opacities. No pleural effusion. Hiatal hernia. IMPRESSION: Increased bronchitic changes and patchy ill-defined pulmonary opacities, suspect respiratory infection, possibly with atypical organism. Cardiomegaly Electronically Signed   By: Esmeralda Hedge M.D.   On: 12/08/2023 03:19   CT Head Wo Contrast Result Date: 12/01/2023 CLINICAL DATA:  Mental status change, unknown cause. Possible syncopal episode. EXAM: CT HEAD WITHOUT CONTRAST TECHNIQUE: Contiguous axial images were obtained from the base of the skull through the vertex  without intravenous contrast. RADIATION DOSE REDUCTION: This exam was performed according to the departmental dose-optimization program which includes automated exposure control, adjustment of the mA and/or kV according to patient size and/or use of iterative reconstruction technique. COMPARISON:  Head CT 07/22/2021. FINDINGS: Brain: No acute hemorrhage. Unchanged background of moderate chronic small-vessel disease. Gray-white differentiation is preserved. Unchanged 4 mm hyperdense lesion along the roof of the third ventricle (coronal image 31 series 4), consistent with a colloid cyst. No hydrocephalus or extra-axial collection. No mass effect or midline shift. Vascular: No hyperdense vessel or unexpected calcification. Skull: No calvarial fracture or suspicious bone lesion. Skull base is unremarkable. Sinuses/Orbits: No acute finding. Other: None. IMPRESSION: 1. No acute intracranial abnormality. 2. 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 No hydrocephalus. 3. Unchanged background of moderate chronic small-vessel disease. Electronically Signed   By: Audra Blend M.D.   On: 12/01/2023 18:31     Symptomatic anemia # Severe anemia hemoglobin 5 [[facility]-APRIL 2025] secondary iron  deficiency from to GI blood loss/AVMs.  Currently s/p PRBC transfusion.  MAY 2025-  hemoglobin around 8.4 .  Proceed with iron  infusions.  # Etiology -iron  deficiency suspect from chronic GI blood loss/AVMs [Dr.Wohl]- defer to GI re: timing of endoscopies.    # Multiple comorbidities COPD/CHF-stable.    # Chronic back pain- stable.   # DISPOSITION: # Venofer  today # weekly x3  venofer  # follow up in 2 months- MD: labs- cbc/cmp; iron  studies; ferritin; possible venofer - Dr.B     Above plan of care was discussed with patient/family in detail.  My contact information was given to the patient/family.     Gwyn Leos, MD 12/22/2023 12:05 PM

## 2023-12-22 NOTE — Progress Notes (Signed)
 Patient declined staying for the full 30 minute observation post Venofer . Vital signs stable at discharge. Made her aware to go to ED if she shows any sign of an allergic reaction. Patient verbalized understanding. Transportation called.

## 2023-12-22 NOTE — Progress Notes (Signed)
 Fatigue/weakness: YES Dyspena: NO  Light headedness: NO Blood in stool: NO

## 2023-12-22 NOTE — Assessment & Plan Note (Addendum)
#   Severe anemia hemoglobin 5 [[facility]-APRIL 2025] secondary iron  deficiency from to GI blood loss/AVMs.  Currently s/p PRBC transfusion.  MAY 2025-  hemoglobin around 8.4 .  Proceed with iron  infusions.  # Etiology -iron  deficiency suspect from chronic GI blood loss/AVMs [Dr.Wohl]- defer to GI re: timing of endoscopies.    # Multiple comorbidities COPD/CHF-stable.    # Chronic back pain- stable.   # DISPOSITION: # Venofer  today # weekly x3  venofer  # follow up in 2 months- MD: labs- cbc/cmp; iron  studies; ferritin; possible venofer - Dr.B

## 2023-12-22 NOTE — Patient Instructions (Signed)

## 2023-12-23 NOTE — Anesthesia Postprocedure Evaluation (Signed)
 Anesthesia Post Note  [name]  Procedure(s) Performed: COLONOSCOPY EGD (ESOPHAGOGASTRODUODENOSCOPY) IMAGING PROCEDURE, GI TRACT, INTRALUMINAL, VIA CAPSULE  Patient location during evaluation: Endoscopy Anesthesia Type: General Level of consciousness: awake and alert Pain management: pain level controlled Vital Signs Assessment: post-procedure vital signs reviewed and stable Respiratory status: spontaneous breathing, nonlabored ventilation, respiratory function stable and patient connected to nasal cannula oxygen  Cardiovascular status: blood pressure returned to baseline and stable Postop Assessment: no apparent nausea or vomiting Anesthetic complications: no   No notable events documented.   Last Vitals:  Vitals:   12/14/23 0509 12/14/23 0852  BP: (!) 156/84 (!) 142/69  Pulse: 79 82  Resp: 20 18  Temp: 36.8 C 36.6 C  SpO2: 96% 93%    Last Pain:  Vitals:   12/14/23 0705  TempSrc:   PainSc: 0-No pain                 Vanice Genre

## 2023-12-25 DIAGNOSIS — D649 Anemia, unspecified: Secondary | ICD-10-CM | POA: Diagnosis not present

## 2023-12-29 ENCOUNTER — Inpatient Hospital Stay

## 2024-01-02 ENCOUNTER — Telehealth: Payer: Self-pay

## 2024-01-02 ENCOUNTER — Ambulatory Visit: Payer: Self-pay

## 2024-01-02 DIAGNOSIS — J449 Chronic obstructive pulmonary disease, unspecified: Secondary | ICD-10-CM | POA: Diagnosis not present

## 2024-01-02 DIAGNOSIS — I700 Atherosclerosis of aorta: Secondary | ICD-10-CM | POA: Diagnosis not present

## 2024-01-02 DIAGNOSIS — D631 Anemia in chronic kidney disease: Secondary | ICD-10-CM | POA: Diagnosis not present

## 2024-01-02 DIAGNOSIS — N3281 Overactive bladder: Secondary | ICD-10-CM | POA: Diagnosis not present

## 2024-01-02 DIAGNOSIS — Q2112 Patent foramen ovale: Secondary | ICD-10-CM | POA: Diagnosis not present

## 2024-01-02 DIAGNOSIS — H401120 Primary open-angle glaucoma, left eye, stage unspecified: Secondary | ICD-10-CM | POA: Diagnosis not present

## 2024-01-02 DIAGNOSIS — N183 Chronic kidney disease, stage 3 unspecified: Secondary | ICD-10-CM | POA: Diagnosis not present

## 2024-01-02 DIAGNOSIS — I130 Hypertensive heart and chronic kidney disease with heart failure and stage 1 through stage 4 chronic kidney disease, or unspecified chronic kidney disease: Secondary | ICD-10-CM | POA: Diagnosis not present

## 2024-01-02 DIAGNOSIS — G629 Polyneuropathy, unspecified: Secondary | ICD-10-CM | POA: Diagnosis not present

## 2024-01-02 DIAGNOSIS — M549 Dorsalgia, unspecified: Secondary | ICD-10-CM | POA: Diagnosis not present

## 2024-01-02 DIAGNOSIS — I5022 Chronic systolic (congestive) heart failure: Secondary | ICD-10-CM | POA: Diagnosis not present

## 2024-01-02 DIAGNOSIS — M1990 Unspecified osteoarthritis, unspecified site: Secondary | ICD-10-CM | POA: Diagnosis not present

## 2024-01-02 DIAGNOSIS — Z7901 Long term (current) use of anticoagulants: Secondary | ICD-10-CM | POA: Diagnosis not present

## 2024-01-02 DIAGNOSIS — E785 Hyperlipidemia, unspecified: Secondary | ICD-10-CM | POA: Diagnosis not present

## 2024-01-02 DIAGNOSIS — M6281 Muscle weakness (generalized): Secondary | ICD-10-CM | POA: Diagnosis not present

## 2024-01-02 DIAGNOSIS — I2699 Other pulmonary embolism without acute cor pulmonale: Secondary | ICD-10-CM | POA: Diagnosis not present

## 2024-01-02 DIAGNOSIS — I701 Atherosclerosis of renal artery: Secondary | ICD-10-CM | POA: Diagnosis not present

## 2024-01-02 DIAGNOSIS — G4700 Insomnia, unspecified: Secondary | ICD-10-CM | POA: Diagnosis not present

## 2024-01-02 DIAGNOSIS — G8929 Other chronic pain: Secondary | ICD-10-CM | POA: Diagnosis not present

## 2024-01-02 DIAGNOSIS — Z8673 Personal history of transient ischemic attack (TIA), and cerebral infarction without residual deficits: Secondary | ICD-10-CM | POA: Diagnosis not present

## 2024-01-02 DIAGNOSIS — Z87891 Personal history of nicotine dependence: Secondary | ICD-10-CM | POA: Diagnosis not present

## 2024-01-02 NOTE — Telephone Encounter (Signed)
 LM on identified voicemail giving verbal orders. Mjp,lpn  Copied from CRM [id number]. Topic: Clinical - Home Health Verbal Orders >> Jan 02, 2024  4:01 PM Donald Frost wrote: Caller/Agency: Bartholomew Light with [facility] Callback Number: [phone] Service Requested: Skilled Nursing  Frequency: 2 x a wk for 2 wks and then weekly for medication compliance, monitoring disease process of PE, hypertension, COPD, monitoring cognition and safety Any new concerns about the patient? Yes concerns with her living alone. They have spoke with the son in [place]  and he will have her neighbor come check on her twice a day   Rest of evaluation for PT, OT AND Medical social worker will be done later this week or at the beginning of next week  Please assist further

## 2024-01-02 NOTE — Telephone Encounter (Signed)
 Chief Complaint: elevated BP  Disposition: [] ED /[] Urgent Care (no appt availability in office) / [] Appointment(In office/virtual)/ []  [facility]/ [] Home Care/ [] Refused Recommended Disposition /[] [facility]/ [x]  Follow-up with PCP  Additional Notes: HH nurse states that patient had a BP reading of 174/102 HR 72 and O2 96 on RA but had not taken BP medication. States pt was asymptomatic. States that she did ensure pt took her medication prior to leaving pt's home. Pt also needs to be schedule for a hospital f/u visit.   Copied from CRM [id number]. Topic: Clinical - Red Word Triage >> Jan 02, 2024  4:19 PM Rachelle R wrote: [facility] that prompted transfer to Nurse Triage: Pam from Home Health calling in regards to patients BP, states prior to them leaving her home is was 174/102. Reason for Disposition  RN needs further essential information from caller in order to complete triage  [1] Caller is not with the adult (patient) AND [2] probable NON-URGENT symptoms  Protocols used: Information Only Call - No Triage-A-AH

## 2024-01-04 ENCOUNTER — Telehealth: Payer: Self-pay

## 2024-01-04 NOTE — Telephone Encounter (Signed)
 Copied from CRM [id number]. Topic: Clinical - Medication Question >> Jan 04, 2024  9:33 AM Crispin Dolphin wrote: Reason for CRM: Fairy Homer RN with [facility] called about patient. Pt was recently discharged from rehab. Fairy Homer asked patient for list of current medications and patient was unable to provider. Caller states she know patient has home health nurse but is unsure if patient has someone helping her with medications and wanted to make provider aware. Thank You

## 2024-01-05 ENCOUNTER — Encounter: Payer: Self-pay | Admitting: Family Medicine

## 2024-01-05 ENCOUNTER — Ambulatory Visit (INDEPENDENT_AMBULATORY_CARE_PROVIDER_SITE_OTHER): Admitting: Family Medicine

## 2024-01-05 ENCOUNTER — Inpatient Hospital Stay

## 2024-01-05 VITALS — BP 124/76 | HR 70 | Temp 97.6°F | Ht 67.0 in | Wt 213.6 lb

## 2024-01-05 VITALS — BP 144/63 | HR 72 | Temp 98.3°F | Resp 18

## 2024-01-05 DIAGNOSIS — F03918 Unspecified dementia, unspecified severity, with other behavioral disturbance: Secondary | ICD-10-CM | POA: Diagnosis not present

## 2024-01-05 DIAGNOSIS — D649 Anemia, unspecified: Secondary | ICD-10-CM

## 2024-01-05 DIAGNOSIS — D509 Iron deficiency anemia, unspecified: Secondary | ICD-10-CM

## 2024-01-05 DIAGNOSIS — D500 Iron deficiency anemia secondary to blood loss (chronic): Secondary | ICD-10-CM | POA: Diagnosis not present

## 2024-01-05 DIAGNOSIS — I2699 Other pulmonary embolism without acute cor pulmonale: Secondary | ICD-10-CM

## 2024-01-05 DIAGNOSIS — N390 Urinary tract infection, site not specified: Secondary | ICD-10-CM | POA: Diagnosis not present

## 2024-01-05 DIAGNOSIS — Z8719 Personal history of other diseases of the digestive system: Secondary | ICD-10-CM

## 2024-01-05 DIAGNOSIS — Z87891 Personal history of nicotine dependence: Secondary | ICD-10-CM | POA: Diagnosis not present

## 2024-01-05 DIAGNOSIS — K5520 Angiodysplasia of colon without hemorrhage: Secondary | ICD-10-CM | POA: Diagnosis not present

## 2024-01-05 MED ORDER — IRON SUCROSE 20 MG/ML IV SOLN
200.0000 mg | Freq: Once | INTRAVENOUS | Status: AC
  Administered 2024-01-05: 200 mg via INTRAVENOUS

## 2024-01-05 MED ORDER — SODIUM CHLORIDE 0.9% FLUSH
10.0000 mL | Freq: Once | INTRAVENOUS | Status: AC | PRN
  Administered 2024-01-05: 10 mL
  Filled 2024-01-05: qty 10

## 2024-01-05 NOTE — Progress Notes (Signed)
 Subjective:    Patient ID: Virginia Walters, female    DOB: January 15, 1941, 83 y.o.   MRN: [id number] Admit date:     12/01/2023  Discharge date: 12/14/23  Discharge Physician: Montey Apa    PCP: Austine Lefort, MD    Recommendations at discharge:    Follow up with Primary Care in 1-2 weeks Follow up with Vascular Surgery as scheduled Follow up on Blood Pressure control.  Amlodipine  was added due to uncontrolled BP's Repeat CBC, CMP at follow up Address duration of anticoagulation for PE   Discharge Diagnoses: Principal Problem:   Syncope Active Problems:   AKI (acute kidney injury) (HCC)   Dementia with behavioral problem   Acute metabolic encephalopathy   Symptomatic anemia   HTN (hypertension)   Alcohol use disorder   Chronic systolic heart failure (HCC)   COPD (chronic obstructive pulmonary disease) (HCC)   Acute pulmonary embolism without acute cor pulmonale (HCC)   Altered mental status   Resolved Problems:   * No resolved hospital problems. [facility] Course:   From HPI "Virginia Walters is a 83 y.o. female with medical history significant for HTN, HLD, COPD, HFpEF( EF 55-60% 11/16/2023), alcohol use disorder, chronic back pain, neuropathy, recently admitted from 4/1 to 4/4 with symptomatic anemia requiring 3 units PRBCs, complicated by acute encephalopathy attributed to delirium versus alcohol abuse versus baseline dementia, referred for outpatient GI workup and IV iron  with hematology, with good being admitted with a syncopal episode that occurred during the course of a colonoscopy prep. Tomorrow.  Patient was found sitting outside confused.  Per triage note, there was evidence of a trail of fecal incontinence from where she was sitting to inside the house and within the house.  Patient is currently oriented to person and place and is not sure about the circumstances but states she must have passed out. Patient was admitted for further evaluation and management  of acute metabolic encephalopathy and question of possible syncopal episode and AKI.  GI was consulted on admission, they do not plan for endoscopy. "   Further hospital course and management as outlined below. I assumed care 12/13/2023.   4/30 -- pt transitioned from IV heparin  to PO Eliquis  this AM.  Monitoring O2 sats closely, weaned off oxygen  this morning.       5/1 -- pt doing well, remains on room air with stable O2 sats.   Patient is medically stable for discharge to rehab today.     Assessment and Plan:   Acute hypoxic respiratory failure (RESOLVED) secondary to  Multiple pulmonary embolism 12/08/2023 patient developed worsening respiratory function and CT scan of the chest obtained showed extensive pulmonary embolism.  Plan of care discussed with vascular surgery team Patient was taken for pulmonary thrombectomy on 12/11/2023 Currently on heparin  drip to be transitioned to Eliquis  later Transitioned from heparin  drip >> Eliquis  on 4/30 Monitor O2 sats on room air, supplement O2 if < 90% Check ambulatory O2 sats in prep for discharge --Continue Eliquis  --PCP and Vascular Surgery Follow up     UTI - POA.  Not septic. 4/20 -- urine culture grew pan-sensitive E.coli Completed 5 days course of antibiotic therapy   Possible Syncope - in setting of pt doing bowel prep Acute metabolic encephalopathy - due to UTI, as well as possible alcohol use CT head non-acute. No focal neurologic deficits. Mental status have improved since admission Patient has completed 5 days course of high-dose IV thiamine  Thiamine   level 86.4 which is normal --Continue maintenance dose of thiamine  --Continue PT OT at rehab   Hypokalemia - resolved --Repeat BMP at follow-up   AKI (acute kidney injury)- resolved Baseline Cr 1.0-1.1. Admitted with Cr 3.28 with pre-renal azotemia --Repeat BMP at follow-up   Mild cognitive impairment (no formal dementia diagnosis per family).  Suspect chronic alcohol  use has contributed to cognitive decline.   --Delirium precautions --Maintain wake/sleep cycles   Symptomatic anemia Presumed GI.  Had recent transfusion of 3 units PRBCs and Venofer  -Status post EGD, colonoscopy and capsule endoscopy --Repeat CBC in 1-2 weeks   COPD (chronic obstructive pulmonary disease) (HCC) Not acutely exacerbated --Resume Breztri  at d/c --PRN albuterol    Chronic systolic heart failure (HCC) EF 55 to 60% with G1 DD.  History of systolic heart failure --Continue lisinopril   --Monitor volume status --No on diuretics   Alcohol use disorder EtOH level undetectable on admission Pt's cousin reports history of drinking wine 4-5+ glasses per day when she is drinking, but looked around pt's home and garbage cans and did not see evidence of recent drinking. --Cancelled CIWA protocol   HTN (hypertension) --Continue lisinopril  40 mg daily --Amlodipine  5 mg daily was added --PCP follow up in 1-2 weeks, adjust regimen as needed   01/05/24 Patient was recently discharged from rehab.  She is here today with her sister-in-law.  Patient was initially admitted to the hospital with severe anemia.  She underwent an EGD as well as a colonoscopy that did not reveal a source of bleeding despite the fact that she was guaiac positive.  She tried and failed a capsule endoscopy.  She does not have any follow-up with GI as an outpatient scheduled.  She was transfused blood in the hospital and was discharged to rehab.  Shortly thereafter, the patient was found on her front porch.  She was extremely confused.  She had been incontinent of stool.  There was fecal incontinence throughout her home.  She was taken to the emergency room and found to have severe pulmonary embolism as well as UTI.  She underwent thrombectomy through vascular surgery.  The urinary tract infection was treated.  She was also scheduled to receive IV iron .  She has an appointment today to go to oncology in [facility] for IV  iron .  She is now back home.  Patient seems very confused today.  I am having to answer questions multiple times.  I then questioned the patient later in the interview and she was unable to recall the answers to her questions that I had given to her a few minutes before.  She has been admitted to the hospital with severe symptomatic anemia, delirium, pulmonary embolism, and urinary tract infections all within the last month.  She is a high fall risk and she is currently on anticoagulation.  However she is living at home independently.  I do not think that this is a safe situation for the patient.  I explained to her and her sister-in-law that she needs to live with someone and have 24/7 supervision.  My concern is that the patient does not have insight into her health care and that she frequently becomes critically ill without self-awareness.  I feel that it is only a matter of time before something else happens and she returns to the hospital.  Therefore I recommended that she consider nursing home or assisted living facility or at least moving in with a family member.  She adamantly refuses this.  This makes her  extremely mad.  She states that I have ruined her life. Past Medical History:  Diagnosis Date   (HFpEF) heart failure with preserved ejection fraction (HCC)    a.) TTE 07/22/2018: EF 45-50%; mild LVH; mild diffuse HK; mild LA dilation; PASP 30-35 mmHg; G1DD.   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   Weakness    Past Surgical History:  Procedure Laterality Date   ANKLE ARTHROSCOPY WITH OPEN REDUCTION INTERNAL FIXATION (ORIF) Right    APPENDECTOMY     CHOLECYSTECTOMY     COLONOSCOPY N/A 07/21/2018   Procedure: COLONOSCOPY;  Surgeon: Selena Daily, MD;  Location: [facility] ENDOSCOPY;  Service: Gastroenterology;  Laterality: N/A;   COLONOSCOPY N/A 11/20/2023   Procedure: COLONOSCOPY;  Surgeon: Marnee Sink, MD;  Location: [facility] ENDOSCOPY;  Service: Endoscopy;  Laterality: N/A;   COLONOSCOPY N/A 11/27/2023   Procedure: COLONOSCOPY;  Surgeon: Marnee Sink, MD;  Location: [facility] ENDOSCOPY;  Service: Endoscopy;  Laterality: N/A;   COLONOSCOPY N/A 12/06/2023   Procedure: COLONOSCOPY;  Surgeon: Marnee Sink, MD;  Location: [facility] ENDOSCOPY;  Service: Endoscopy;  Laterality: N/A;   COLONOSCOPY WITH PROPOFOL  N/A 04/01/2020   Procedure: COLONOSCOPY WITH PROPOFOL ;  Surgeon: Irby Mannan, MD;  Location: [facility] ENDOSCOPY;  Service: Endoscopy;  Laterality: N/A;   ESOPHAGOGASTRODUODENOSCOPY N/A 07/21/2018   Procedure: ESOPHAGOGASTRODUODENOSCOPY (EGD);  Surgeon: Selena Daily, MD;  Location: [facility] ENDOSCOPY;  Service: Gastroenterology;  Laterality: N/A;   ESOPHAGOGASTRODUODENOSCOPY N/A 02/11/2020   Procedure: ESOPHAGOGASTRODUODENOSCOPY (EGD);  Surgeon: Irby Mannan, MD;  Location: [facility] ENDOSCOPY;  Service: Endoscopy;  Laterality: N/A;   ESOPHAGOGASTRODUODENOSCOPY N/A 11/20/2023   Procedure: EGD (ESOPHAGOGASTRODUODENOSCOPY);  Surgeon: Marnee Sink, MD;  Location: [facility]  ENDOSCOPY;  Service: Endoscopy;  Laterality: N/A;   ESOPHAGOGASTRODUODENOSCOPY N/A 11/27/2023   Procedure: EGD (ESOPHAGOGASTRODUODENOSCOPY);  Surgeon: Marnee Sink, MD;  Location: [facility] ENDOSCOPY;  Service: Endoscopy;  Laterality: N/A;   ESOPHAGOGASTRODUODENOSCOPY N/A 12/06/2023   Procedure: EGD (ESOPHAGOGASTRODUODENOSCOPY);  Surgeon: Marnee Sink, MD;  Location: [facility] ENDOSCOPY;  Service: Endoscopy;  Laterality: N/A;   ESOPHAGOGASTRODUODENOSCOPY (EGD) WITH PROPOFOL  N/A 04/01/2020   Procedure: ESOPHAGOGASTRODUODENOSCOPY (EGD) WITH PROPOFOL ;  Surgeon: Irby Mannan, MD;  Location: [facility] ENDOSCOPY;  Service: Endoscopy;  Laterality: N/A;   FRACTURE SURGERY Left    leg fx   GIVENS CAPSULE STUDY  12/06/2023   Procedure: IMAGING PROCEDURE, GI TRACT, INTRALUMINAL, VIA CAPSULE;  Surgeon: Marnee Sink, MD;  Location: [facility] ENDOSCOPY;  Service: Endoscopy;;  IR EXCHANGE BILIARY DRAIN  10/22/2021   IR PERC CHOLECYSTOSTOMY  09/17/2021   PULMONARY THROMBECTOMY N/A 12/11/2023   Procedure: PULMONARY THROMBECTOMY;  Surgeon: Jackquelyn Mass, MD;  Location: [facility] INVASIVE CV LAB;  Service: Cardiovascular;  Laterality: N/A;   TUBAL LIGATION     Current Outpatient Medications on File Prior to Visit  Medication Sig Dispense Refill   acetaminophen  (TYLENOL ) 325 MG tablet Take 2 tablets (650 mg total) by mouth every 6 (six) hours as needed for mild pain (pain score 1-3), fever or headache (or Fever >/= 101).     amLODipine  (NORVASC ) 5 MG tablet Take 1 tablet (5 mg total) by mouth daily.     apixaban  (ELIQUIS ) 5 MG TABS tablet Take 1 tablet (5 mg total) by mouth 2 (two) times daily.     BREZTRI  AEROSPHERE 160-9-4.8 MCG/ACT AERO USE 2 INHALATIONS BY MOUTH INTO  THE LUNGS IN THE MORNING AND AT  BEDTIME 32.1 g 3   dorzolamide  (TRUSOPT ) 2 % ophthalmic solution Place 1 drop into the left eye 2 (two) times daily.     doxazosin  (CARDURA ) 4 MG tablet TAKE ONE-HALF TABLET BY MOUTH  DAILY 50 tablet 2   fish oil-omega-3  fatty acids 1000 MG capsule Take 1,000 mg by mouth daily.     lisinopril  (ZESTRIL ) 40 MG tablet TAKE 1 TABLET BY MOUTH DAILY 100 tablet 2   Multiple Vitamin (MULTIVITAMIN) tablet Take 1 tablet by mouth daily.     pravastatin  (PRAVACHOL ) 40 MG tablet Take 1 tablet (40 mg total) by mouth daily. 09/29/23-PT NEEDS APPOINTMENT 30 tablet 0   solifenacin  (VESICARE ) 10 MG tablet Take 1 tablet (10 mg total) by mouth daily. 90 tablet 3   thiamine  (VITAMIN B-1) 100 MG tablet Take 1 tablet (100 mg total) by mouth daily.     traZODone  (DESYREL ) 50 MG tablet TAKE 1 TABLET BY MOUTH AT  BEDTIME 90 tablet 3   albuterol  (VENTOLIN  HFA) 108 (90 Base) MCG/ACT inhaler Inhale 2 puffs into the lungs every 6 (six) hours as needed for wheezing or shortness of breath. (Patient not taking: Reported on 01/05/2024)     Ascorbic Acid  (VITAMIN C  PO) Take 1 tablet by mouth daily. (Patient not taking: Reported on 01/05/2024)     feeding supplement (ENSURE SURGERY) LIQD Take 237 mLs by mouth 2 (two) times daily between meals. (Patient not taking: Reported on 01/05/2024)     pantoprazole  (PROTONIX ) 40 MG tablet Take 1 tablet (40 mg total) by mouth daily. 30 tablet 0   No current facility-administered medications on file prior to visit.   Allergies  Allergen Reactions   Ibuprofen     Oxycodone  Itching   Social History   Socioeconomic History   Marital status: Widowed    Spouse name: Not on file   Number of children: 1   Years of education: HS   Highest education level: Not on file  Occupational History   Occupation: Retired  Tobacco Use   Smoking status: Former    Current packs/day: 0.00    Average packs/day: 1 pack/day for 15.0 years (15.0 ttl pk-yrs)    Types: Cigarettes    Start date: 12/27/1965    Quit date: 12/27/1980    Years since quitting: 43.0   Smokeless tobacco: Never  Vaping Use   Vaping status: Never Used  Substance and Sexual Activity   Alcohol use: Yes    Alcohol/week: 1.0 standard drink of alcohol     Types: 1 Glasses of wine per week  Drug use: No   Sexual activity: Not Currently  Other Topics Concern   Not on file  Social History Narrative   Lives at home alone. Husband passed away 01/22/13.   Right-handed.   No daily use of caffeine.   Daughter lives in [place] .   Social Drivers of Corporate investment banker Strain: Low Risk  (05/25/2022)   Overall Financial Resource Strain (CARDIA)    Difficulty of Paying Living Expenses: Not hard at all  Food Insecurity: No Food Insecurity (12/02/2023)   Hunger Vital Sign    Worried About Running Out of Food in the Last Year: Never true    Ran Out of Food in the Last Year: Never true  Transportation Needs: No Transportation Needs (12/02/2023)   PRAPARE - Administrator, Civil Service (Medical): No    Lack of Transportation (Non-Medical): No  Physical Activity: Inactive (05/25/2022)   Exercise Vital Sign    Days of Exercise per Week: 0 days    Minutes of Exercise per Session: 0 min  Stress: No Stress Concern Present (05/25/2022)   [employer] of Occupational Health - Occupational Stress Questionnaire    Feeling of Stress : Not at all  Social Connections: Moderately Integrated (12/02/2023)   Social Connection and Isolation Panel [NHANES]    Frequency of Communication with Friends and Family: More than three times a week    Frequency of Social Gatherings with Friends and Family: More than three times a week    Attends Religious Services: More than 4 times per year    Active Member of [employer] or Organizations: Yes    Attends Banker Meetings: More than 4 times per year    Marital Status: Widowed  Intimate Partner Violence: Not At Risk (12/02/2023)   Humiliation, Afraid, Rape, and Kick questionnaire    Fear of Current or Ex-Partner: No    Emotionally Abused: No    Physically Abused: No    Sexually Abused: No     Review of Systems  All other systems reviewed and are negative.      Objective:   Physical  Exam Constitutional:      General: She is not in acute distress.    Appearance: She is not ill-appearing, toxic-appearing or diaphoretic.  HENT:     Mouth/Throat:     Mouth: Mucous membranes are moist.  Neck:     Vascular: No carotid bruit.  Cardiovascular:     Rate and Rhythm: Normal rate and regular rhythm.     Heart sounds: Normal heart sounds. No murmur heard. Pulmonary:     Effort: Pulmonary effort is normal. No respiratory distress.     Breath sounds: No stridor. No wheezing, rhonchi or rales.  Chest:     Chest wall: No tenderness.  Musculoskeletal:     Right lower leg: No edema.     Left lower leg: No edema.  Skin:    Findings: Rash is not crusting, nodular, purpuric, pustular, urticarial or vesicular.  Neurological:     General: No focal deficit present.     Mental Status: She is alert. Mental status is at baseline.     Cranial Nerves: No cranial nerve deficit.     Sensory: Sensation is intact.     Motor: Weakness present.     Coordination: Coordination is intact.     Gait: Gait is intact.  Psychiatric:        Attention and Perception: Attention normal.  Mood and Affect: Mood normal.        Speech: Speech normal.        Behavior: Behavior normal.        Cognition and Memory: Cognition is impaired. She exhibits impaired recent memory.          Assessment & Plan:  Urinary tract infection without hematuria, site unspecified - Plan: Urinalysis, Routine w reflex microscopic, Urine Culture  Dementia with behavioral problem  Iron  deficiency anemia, unspecified iron  deficiency anemia type  Acute pulmonary embolism without acute cor pulmonale, unspecified pulmonary embolism type (HCC)  History of GI bleed I tried to explain to the patient that she has had to be admitted to the hospital with severe anemia on 2 separate occasions due to a GI bleed.  She most likely has a source in the small intestine.  I feel that she is at high risk for recurrent GI blood loss.   She also has severe iron  deficiency anemia.  She is seeing oncology and has an appointment to see them this afternoon for lab work as well as IV iron .  Hopefully we will be able to keep her hemoglobin at a safe level using the IV iron .  However she is at high risk for recurrent bleed due to eliquis .  I would continue the Eliquis  for the shortest amount of time possible due to her fall risk, and history of GI bleed.  Therefore I would plan on using Eliquis  for 3 months and then discontinuing the medication around September 1.  Strongly recommended a nursing facility or moving in with a family member.  I do not believe the patient is able to properly care for herself at home due to her mild dementia.  She also has shown that she is at risk for hospitalizations due to her inability to realize when she is becoming ill.  Therefore I feel that she is at risk for repeat admissions.  I believe that 24/7 supervision would help reduce this risk.  I tried to explain this to the patient as best I could however this obviously upset her and made her extremely mad.  Recommended checking a urine sample to ensure resolution of her urinary tract infection however she was unable to urinate.  She is scheduled to see hematology later today for lab work so I will defer to them to recheck her CBC

## 2024-01-09 ENCOUNTER — Other Ambulatory Visit: Payer: Self-pay

## 2024-01-09 ENCOUNTER — Telehealth: Payer: Self-pay

## 2024-01-09 DIAGNOSIS — I2699 Other pulmonary embolism without acute cor pulmonale: Secondary | ICD-10-CM

## 2024-01-09 MED ORDER — APIXABAN 5 MG PO TABS
5.0000 mg | ORAL_TABLET | Freq: Two times a day (BID) | ORAL | 0 refills | Status: DC
Start: 2024-01-09 — End: 2024-01-12

## 2024-01-09 NOTE — Telephone Encounter (Signed)
 Copied from CRM [id number]. Topic: General - Other >> Jan 09, 2024  1:39 PM Emylou G wrote: Reason for CRM: Please call daughter Maurene Sours [phone].. checking status of medications?

## 2024-01-10 ENCOUNTER — Telehealth: Payer: Self-pay | Admitting: Family Medicine

## 2024-01-10 DIAGNOSIS — I2699 Other pulmonary embolism without acute cor pulmonale: Secondary | ICD-10-CM

## 2024-01-10 NOTE — Telephone Encounter (Unsigned)
 Copied from CRM [id number]. Topic: Clinical - Medication Refill >> Jan 10, 2024  3:13 PM Felizardo Hotter wrote: Medication: apixaban  (ELIQUIS ) 5 MG TABS tablet  Has the patient contacted their pharmacy? Yes (Agent: If no, request that the patient contact the pharmacy for the refill. If patient does not wish to contact the pharmacy document the reason why and proceed with request.) (Agent: If yes, when and what did the pharmacy advise?)  This is the patient's preferred pharmacy:   [facility] - [place], [place] - [address] [place] [address] Phone: [phone] Fax: [phone]  Is this the correct pharmacy for this prescription? Yes If no, delete pharmacy and type the correct one.   Has the prescription been filled recently? Yes  Is the patient out of the medication? Yes  Has the patient been seen for an appointment in the last year OR does the patient have an upcoming appointment? Yes  Can we respond through MyChart? No  Agent: Please be advised that Rx refills may take up to 3 business days. We ask that you follow-up with your pharmacy.

## 2024-01-11 ENCOUNTER — Telehealth: Payer: Self-pay | Admitting: Family Medicine

## 2024-01-11 ENCOUNTER — Ambulatory Visit: Payer: Self-pay

## 2024-01-11 DIAGNOSIS — I5022 Chronic systolic (congestive) heart failure: Secondary | ICD-10-CM | POA: Diagnosis not present

## 2024-01-11 DIAGNOSIS — G629 Polyneuropathy, unspecified: Secondary | ICD-10-CM | POA: Diagnosis not present

## 2024-01-11 DIAGNOSIS — Z7901 Long term (current) use of anticoagulants: Secondary | ICD-10-CM | POA: Diagnosis not present

## 2024-01-11 DIAGNOSIS — Z87891 Personal history of nicotine dependence: Secondary | ICD-10-CM | POA: Diagnosis not present

## 2024-01-11 DIAGNOSIS — Q2112 Patent foramen ovale: Secondary | ICD-10-CM | POA: Diagnosis not present

## 2024-01-11 DIAGNOSIS — M1990 Unspecified osteoarthritis, unspecified site: Secondary | ICD-10-CM | POA: Diagnosis not present

## 2024-01-11 DIAGNOSIS — D631 Anemia in chronic kidney disease: Secondary | ICD-10-CM | POA: Diagnosis not present

## 2024-01-11 DIAGNOSIS — I701 Atherosclerosis of renal artery: Secondary | ICD-10-CM | POA: Diagnosis not present

## 2024-01-11 DIAGNOSIS — N3281 Overactive bladder: Secondary | ICD-10-CM | POA: Diagnosis not present

## 2024-01-11 DIAGNOSIS — M549 Dorsalgia, unspecified: Secondary | ICD-10-CM | POA: Diagnosis not present

## 2024-01-11 DIAGNOSIS — J449 Chronic obstructive pulmonary disease, unspecified: Secondary | ICD-10-CM | POA: Diagnosis not present

## 2024-01-11 DIAGNOSIS — I130 Hypertensive heart and chronic kidney disease with heart failure and stage 1 through stage 4 chronic kidney disease, or unspecified chronic kidney disease: Secondary | ICD-10-CM | POA: Diagnosis not present

## 2024-01-11 DIAGNOSIS — M6281 Muscle weakness (generalized): Secondary | ICD-10-CM | POA: Diagnosis not present

## 2024-01-11 DIAGNOSIS — I700 Atherosclerosis of aorta: Secondary | ICD-10-CM | POA: Diagnosis not present

## 2024-01-11 DIAGNOSIS — N183 Chronic kidney disease, stage 3 unspecified: Secondary | ICD-10-CM | POA: Diagnosis not present

## 2024-01-11 DIAGNOSIS — G8929 Other chronic pain: Secondary | ICD-10-CM | POA: Diagnosis not present

## 2024-01-11 DIAGNOSIS — E785 Hyperlipidemia, unspecified: Secondary | ICD-10-CM | POA: Diagnosis not present

## 2024-01-11 DIAGNOSIS — I2699 Other pulmonary embolism without acute cor pulmonale: Secondary | ICD-10-CM | POA: Diagnosis not present

## 2024-01-11 DIAGNOSIS — Z8673 Personal history of transient ischemic attack (TIA), and cerebral infarction without residual deficits: Secondary | ICD-10-CM | POA: Diagnosis not present

## 2024-01-11 DIAGNOSIS — G4700 Insomnia, unspecified: Secondary | ICD-10-CM | POA: Diagnosis not present

## 2024-01-11 DIAGNOSIS — H401120 Primary open-angle glaucoma, left eye, stage unspecified: Secondary | ICD-10-CM | POA: Diagnosis not present

## 2024-01-11 NOTE — Telephone Encounter (Signed)
 Copied from CRM [id number]. Topic: General - Other >> Jan 11, 2024  9:29 AM Baldemar Lev wrote: Reason for CRM: Maurene Sours is faxing over a medical leave of absence form to cover her work leave while she cares for the patient.   Best contact: [phone] work phone number. There until 4.  As of 11:46am, LOA form has not come through the electronic fax.

## 2024-01-11 NOTE — Telephone Encounter (Signed)
 Chief Complaint: medication question  Disposition: [] ED /[] Urgent Care (no appt availability in office) / [] Appointment(In office/virtual)/ []  [facility]/ [] Home Care/ [] Refused Recommended Disposition /[] [place] [facility]/ [x]  Follow-up with PCP Additional Notes: Pt's daughter states that she received lisinopril  and Doxazosin  and she was wondering if pt is supposed to be currently taking these medications.   Copied from CRM [id number]. Topic: Clinical - Medication Question >> Jan 11, 2024  9:32 AM Baldemar Lev wrote: Reason for CRM: Pt's daughter called with medication questions.   Please advise [phone] (work number) Reason for Disposition  [1] Caller has NON-URGENT medicine question about med that PCP prescribed AND [2] triager unable to answer question  Answer Assessment - Initial Assessment Questions 1. NAME of MEDICINE: "What medicine(s) are you calling about?"     Lisinopril  and doxazosin  2. QUESTION: "What is your question?" (e.g., double dose of medicine, side effect)     Is pt supposed to be taking 3. PRESCRIBER: "Who prescribed the medicine?" Reason: if prescribed by specialist, call should be referred to that group.     [place]  Protocols used: Medication Question Call-A-AH

## 2024-01-12 ENCOUNTER — Telehealth: Payer: Self-pay

## 2024-01-12 ENCOUNTER — Telehealth: Payer: Self-pay | Admitting: Family Medicine

## 2024-01-12 ENCOUNTER — Other Ambulatory Visit: Payer: Self-pay

## 2024-01-12 ENCOUNTER — Inpatient Hospital Stay

## 2024-01-12 VITALS — BP 95/64 | HR 75 | Temp 97.7°F | Resp 18

## 2024-01-12 DIAGNOSIS — I2699 Other pulmonary embolism without acute cor pulmonale: Secondary | ICD-10-CM

## 2024-01-12 DIAGNOSIS — D500 Iron deficiency anemia secondary to blood loss (chronic): Secondary | ICD-10-CM | POA: Diagnosis not present

## 2024-01-12 DIAGNOSIS — K5520 Angiodysplasia of colon without hemorrhage: Secondary | ICD-10-CM | POA: Diagnosis not present

## 2024-01-12 DIAGNOSIS — D649 Anemia, unspecified: Secondary | ICD-10-CM

## 2024-01-12 DIAGNOSIS — Z87891 Personal history of nicotine dependence: Secondary | ICD-10-CM | POA: Diagnosis not present

## 2024-01-12 MED ORDER — APIXABAN 5 MG PO TABS: 5.0000 mg | ORAL_TABLET | Freq: Two times a day (BID) | ORAL | 0 refills | Status: AC

## 2024-01-12 MED ORDER — SODIUM CHLORIDE 0.9% FLUSH
10.0000 mL | Freq: Once | INTRAVENOUS | Status: AC | PRN
  Administered 2024-01-12: 10 mL
  Filled 2024-01-12: qty 10

## 2024-01-12 MED ORDER — IRON SUCROSE 20 MG/ML IV SOLN
200.0000 mg | Freq: Once | INTRAVENOUS | Status: AC
  Administered 2024-01-12: 200 mg via INTRAVENOUS

## 2024-01-12 NOTE — Telephone Encounter (Signed)
 Requested Prescriptions  Refused Prescriptions Disp Refills   apixaban  (ELIQUIS ) 5 MG TABS tablet      Sig: Take 1 tablet (5 mg total) by mouth 2 (two) times daily.     Hematology:  Anticoagulants - apixaban  Failed - 01/12/2024  2:48 PM      Failed - HGB in normal range and within 360 days    Hemoglobin  Date Value Ref Range Status  12/22/2023 8.7 (L) 12.0 - 15.0 g/dL Final         Failed - HCT in normal range and within 360 days    HCT  Date Value Ref Range Status  12/22/2023 28.8 (L) 36.0 - 46.0 % Final         Failed - Valid encounter within last 12 months    Recent Outpatient Visits           1 week ago Urinary tract infection without hematuria, site unspecified   [facility] Austine Lefort, MD   2 months ago Chronic right-sided low back pain without sciatica   [facility] Austine Lefort, MD   1 year ago Neurodermatitis   [facility] Jenelle Mis, FNP   1 year ago Neurodermatitis   [employer] [facility] Cheril Cork, Cisco Crest, MD   1 year ago Dermatitis   [facility] Pick[place], Cisco Crest, MD              Passed - PLT in normal range and within 360 days    Platelet Count  Date Value Ref Range Status  12/22/2023 240 150 - 400 K/uL Final         Passed - Cr in normal range and within 360 days    Creatinine  Date Value Ref Range Status  12/22/2023 0.92 0.44 - 1.00 mg/dL Final   Creat  Date Value Ref Range Status  11/13/2023 1.12 (H) 0.60 - 0.95 mg/dL Final   Creatinine, Urine  Date Value Ref Range Status  10/08/2021 81 mg/dL Final    Comment:    Performed at [facility], [address] [place], [place] [address]         Passed - AST in normal range and within 360 days    AST  Date Value Ref Range Status  12/22/2023 19 15 - 41 U/L Final         Passed - ALT in normal range and within 360 days    ALT  Date  Value Ref Range Status  12/22/2023 20 0 - 44 U/L Final

## 2024-01-12 NOTE — Telephone Encounter (Signed)
 Paperwork received via [employer], printed, and given to provider's nurse. As per [employer] rep, paperwork is time sensitive; needed within 12 days.   Please advise Virginia Walters when completed at [phone], or at work number of [phone].

## 2024-01-12 NOTE — Telephone Encounter (Signed)
 Patient needs Eliquis  samples; she's been out of meds for one week. Dispensed six (6) sample boxes.

## 2024-01-12 NOTE — Telephone Encounter (Signed)
 Eliquis  sent to [facility]. Mjp,lpn   Copied from CRM [id number]. Topic: Clinical - Prescription Issue >> Jan 11, 2024  5:00 PM Rennis Case wrote: Reason for CRM: Glade Lambert calling on behalf of Arika Mainer to check on status request of eliquis . Advised unable to provide further information as she is not listed on DPR.   Lorelee Roger was able to get Ms Aniyha on the line. Chaunta requesting rx be sent in as soon as possible as the pharmacy still does not have prescription. Rx showing sent to pharmacy on 01/09/2024 (at bottom states "Class:No print".   Johnsie requesting rx refill request to be expedited as patient has been out of it for a week now.

## 2024-01-12 NOTE — Telephone Encounter (Signed)
 LM advising [name] that there is not a coupon for Eliquis  but I can give them some samples. I will send a pharmacy referral to see if the patient can get assistance with the medication. Mjp,lpn  Copied from CRM [id number]. Topic: Clinical - Prescription Issue >> Jan 12, 2024  1:35 PM Virginia Walters wrote: Virginia Walters is calling back because she went to pick up Eliquis  and it was going to cost her around $206.00. Virginia Walters cannot afford medication and Virginia Walters wants to know if Virginia Walters has a coupon to get Eliquis  at a lower cost or free. Virginia Walters's number is [phone].  Virginia Walters has not had this medication for over 1 week. Thanks

## 2024-01-15 ENCOUNTER — Telehealth: Payer: Self-pay

## 2024-01-15 DIAGNOSIS — Z0279 Encounter for issue of other medical certificate: Secondary | ICD-10-CM

## 2024-01-15 NOTE — Progress Notes (Signed)
 Care Guide Pharmacy Note  01/15/2024 Name: LEYLANIE WOODMANSEE MRN: [id number] DOB: [birth year]  Referred By: Austine Lefort, MD Reason for referral: Complex Care Management (Initial Outreach Referral for Pharm D)   Charee I Hitz is a 83 y.o. year old female who is a primary care patient of Cheril Cork, Cisco Crest, MD.  Ozell Blunt I Chevere was referred to the pharmacist for assistance related to: Medication Assistance  An unsuccessful telephone outreach was attempted today to contact the patient who was referred to the pharmacy team for assistance with medication assistance. Additional attempts will be made to contact the patient.   Creola Doheny [facility], [facility] Guide  Direct Dial: [phone]  Fax [phone]

## 2024-01-16 ENCOUNTER — Telehealth: Payer: Self-pay

## 2024-01-16 NOTE — Progress Notes (Unsigned)
 Complex Care Management Note Care Guide Note  01/16/2024 Name: Virginia Walters MRN: [id number] DOB: March 28, 1941   Complex Care Management Outreach Attempts: A second unsuccessful outreach was attempted today to offer the patient with information about available complex care management services.  Follow Up Plan:  Additional outreach attempts will be made to offer the patient complex care management information and services.   Encounter Outcome:  No Answer  Creola Doheny [facility], [facility] Guide  Direct Dial: [phone]  Fax [phone]

## 2024-01-16 NOTE — Telephone Encounter (Signed)
 Copied from CRM [id number]. Topic: General - Other >> Jan 16, 2024 10:42 AM Antwanette L wrote: Reason for CRM: Patient daughter Nurse, children's ) had a missed call from [employer]. The patient was referred to the pharmacy team for assistance with medication assistance I called Brandy back but she didn't answer. Please have Normand Beckwith to contact [place] at [phone]

## 2024-01-17 NOTE — Progress Notes (Signed)
 Complex Care Management Note Care Guide Note  01/17/2024 Name: Virginia Walters MRN: [id number] DOB: 1941/03/18   Complex Care Management Outreach Attempts: A second unsuccessful outreach was attempted today to offer the patient with information about available complex care management services.  Follow Up Plan:  Additional outreach attempts will be made to offer the patient complex care management information and services.   Encounter Outcome:  No Answer  Creola Doheny [facility], [facility] Guide  Direct Dial: [phone]  Fax [phone]

## 2024-01-17 NOTE — Progress Notes (Signed)
 Complex Care Management Note Care Guide Note  01/17/2024 Name: Virginia Walters MRN: [id number] DOB: 1940/12/31   Complex Care Management Outreach Attempts: A third unsuccessful outreach was attempted today to offer the patient with information about available complex care management services.  Follow Up Plan:  No further outreach attempts will be made at this time. We have been unable to contact the patient to offer or enroll patient in complex care management services.  Encounter Outcome:  No Answer  Creola Doheny [facility], [facility] Guide  Direct Dial: [phone]  Fax [phone]

## 2024-01-18 DIAGNOSIS — M6281 Muscle weakness (generalized): Secondary | ICD-10-CM | POA: Diagnosis not present

## 2024-01-18 DIAGNOSIS — J449 Chronic obstructive pulmonary disease, unspecified: Secondary | ICD-10-CM | POA: Diagnosis not present

## 2024-01-18 DIAGNOSIS — I700 Atherosclerosis of aorta: Secondary | ICD-10-CM | POA: Diagnosis not present

## 2024-01-18 DIAGNOSIS — M549 Dorsalgia, unspecified: Secondary | ICD-10-CM | POA: Diagnosis not present

## 2024-01-18 DIAGNOSIS — Q2112 Patent foramen ovale: Secondary | ICD-10-CM | POA: Diagnosis not present

## 2024-01-18 DIAGNOSIS — I2699 Other pulmonary embolism without acute cor pulmonale: Secondary | ICD-10-CM | POA: Diagnosis not present

## 2024-01-18 DIAGNOSIS — Z7901 Long term (current) use of anticoagulants: Secondary | ICD-10-CM | POA: Diagnosis not present

## 2024-01-18 DIAGNOSIS — E785 Hyperlipidemia, unspecified: Secondary | ICD-10-CM | POA: Diagnosis not present

## 2024-01-18 DIAGNOSIS — Z87891 Personal history of nicotine dependence: Secondary | ICD-10-CM | POA: Diagnosis not present

## 2024-01-18 DIAGNOSIS — I701 Atherosclerosis of renal artery: Secondary | ICD-10-CM | POA: Diagnosis not present

## 2024-01-18 DIAGNOSIS — M1990 Unspecified osteoarthritis, unspecified site: Secondary | ICD-10-CM | POA: Diagnosis not present

## 2024-01-18 DIAGNOSIS — I130 Hypertensive heart and chronic kidney disease with heart failure and stage 1 through stage 4 chronic kidney disease, or unspecified chronic kidney disease: Secondary | ICD-10-CM | POA: Diagnosis not present

## 2024-01-18 DIAGNOSIS — G629 Polyneuropathy, unspecified: Secondary | ICD-10-CM | POA: Diagnosis not present

## 2024-01-18 DIAGNOSIS — N3281 Overactive bladder: Secondary | ICD-10-CM | POA: Diagnosis not present

## 2024-01-18 DIAGNOSIS — G4700 Insomnia, unspecified: Secondary | ICD-10-CM | POA: Diagnosis not present

## 2024-01-18 DIAGNOSIS — Z8673 Personal history of transient ischemic attack (TIA), and cerebral infarction without residual deficits: Secondary | ICD-10-CM | POA: Diagnosis not present

## 2024-01-18 DIAGNOSIS — I5022 Chronic systolic (congestive) heart failure: Secondary | ICD-10-CM | POA: Diagnosis not present

## 2024-01-18 DIAGNOSIS — D631 Anemia in chronic kidney disease: Secondary | ICD-10-CM | POA: Diagnosis not present

## 2024-01-18 DIAGNOSIS — N183 Chronic kidney disease, stage 3 unspecified: Secondary | ICD-10-CM | POA: Diagnosis not present

## 2024-01-18 DIAGNOSIS — G8929 Other chronic pain: Secondary | ICD-10-CM | POA: Diagnosis not present

## 2024-01-18 DIAGNOSIS — H401120 Primary open-angle glaucoma, left eye, stage unspecified: Secondary | ICD-10-CM | POA: Diagnosis not present

## 2024-01-22 ENCOUNTER — Telehealth: Payer: Self-pay | Admitting: Family Medicine

## 2024-01-22 NOTE — Telephone Encounter (Signed)
 Copied from CRM [id number]. Topic: Medical Record Request - Other >> Jan 17, 2024 11:39 AM Rosaria Common wrote: Reason for CRM: Pt's daughter Maurene Sours is requesting FMLA paperwork is mailed to her home. Callback number is [phone].

## 2024-01-23 DIAGNOSIS — I700 Atherosclerosis of aorta: Secondary | ICD-10-CM | POA: Diagnosis not present

## 2024-01-23 DIAGNOSIS — M6281 Muscle weakness (generalized): Secondary | ICD-10-CM | POA: Diagnosis not present

## 2024-01-23 DIAGNOSIS — G629 Polyneuropathy, unspecified: Secondary | ICD-10-CM | POA: Diagnosis not present

## 2024-01-23 DIAGNOSIS — M1990 Unspecified osteoarthritis, unspecified site: Secondary | ICD-10-CM | POA: Diagnosis not present

## 2024-01-23 DIAGNOSIS — D631 Anemia in chronic kidney disease: Secondary | ICD-10-CM | POA: Diagnosis not present

## 2024-01-23 DIAGNOSIS — G4700 Insomnia, unspecified: Secondary | ICD-10-CM | POA: Diagnosis not present

## 2024-01-23 DIAGNOSIS — M549 Dorsalgia, unspecified: Secondary | ICD-10-CM | POA: Diagnosis not present

## 2024-01-23 DIAGNOSIS — E785 Hyperlipidemia, unspecified: Secondary | ICD-10-CM | POA: Diagnosis not present

## 2024-01-23 DIAGNOSIS — I130 Hypertensive heart and chronic kidney disease with heart failure and stage 1 through stage 4 chronic kidney disease, or unspecified chronic kidney disease: Secondary | ICD-10-CM | POA: Diagnosis not present

## 2024-01-23 DIAGNOSIS — J449 Chronic obstructive pulmonary disease, unspecified: Secondary | ICD-10-CM | POA: Diagnosis not present

## 2024-01-23 DIAGNOSIS — Z7901 Long term (current) use of anticoagulants: Secondary | ICD-10-CM | POA: Diagnosis not present

## 2024-01-23 DIAGNOSIS — N3281 Overactive bladder: Secondary | ICD-10-CM | POA: Diagnosis not present

## 2024-01-23 DIAGNOSIS — I5022 Chronic systolic (congestive) heart failure: Secondary | ICD-10-CM | POA: Diagnosis not present

## 2024-01-23 DIAGNOSIS — H401120 Primary open-angle glaucoma, left eye, stage unspecified: Secondary | ICD-10-CM | POA: Diagnosis not present

## 2024-01-23 DIAGNOSIS — Q2112 Patent foramen ovale: Secondary | ICD-10-CM | POA: Diagnosis not present

## 2024-01-23 DIAGNOSIS — Z8673 Personal history of transient ischemic attack (TIA), and cerebral infarction without residual deficits: Secondary | ICD-10-CM | POA: Diagnosis not present

## 2024-01-23 DIAGNOSIS — Z87891 Personal history of nicotine dependence: Secondary | ICD-10-CM | POA: Diagnosis not present

## 2024-01-23 DIAGNOSIS — N183 Chronic kidney disease, stage 3 unspecified: Secondary | ICD-10-CM | POA: Diagnosis not present

## 2024-01-23 DIAGNOSIS — I2699 Other pulmonary embolism without acute cor pulmonale: Secondary | ICD-10-CM | POA: Diagnosis not present

## 2024-01-23 DIAGNOSIS — I701 Atherosclerosis of renal artery: Secondary | ICD-10-CM | POA: Diagnosis not present

## 2024-01-23 DIAGNOSIS — G8929 Other chronic pain: Secondary | ICD-10-CM | POA: Diagnosis not present

## 2024-01-23 NOTE — Telephone Encounter (Addendum)
 Missing questions answered. Form completion fee of $29 collected.   Corrected FMLA paperwork successfully faxed to [facility] Management with a confirmation time stamp of Jun/05/2024 3:20:49 PM.  Paperwork also successfully faxed to [facility] with a copy of the receipt with a confirmation time stamp of Jun/05/2024 3:38:15 PM  As requested, original copies mailed to patient's daughter Maurene Sours using [place]  address provided.

## 2024-01-23 NOTE — Telephone Encounter (Signed)
 FMLA paperwork completed and signed by provider.  Form completion fee of $29 due; questions on pages 3 and 4 are incomplete. Placed paperwork on provider's desk. Maurene Sours stated she will pay after missing questions completed.

## 2024-01-24 DIAGNOSIS — I130 Hypertensive heart and chronic kidney disease with heart failure and stage 1 through stage 4 chronic kidney disease, or unspecified chronic kidney disease: Secondary | ICD-10-CM | POA: Diagnosis not present

## 2024-01-24 DIAGNOSIS — N183 Chronic kidney disease, stage 3 unspecified: Secondary | ICD-10-CM | POA: Diagnosis not present

## 2024-01-24 DIAGNOSIS — J449 Chronic obstructive pulmonary disease, unspecified: Secondary | ICD-10-CM | POA: Diagnosis not present

## 2024-01-24 DIAGNOSIS — Q2112 Patent foramen ovale: Secondary | ICD-10-CM | POA: Diagnosis not present

## 2024-01-24 DIAGNOSIS — G4700 Insomnia, unspecified: Secondary | ICD-10-CM | POA: Diagnosis not present

## 2024-01-24 DIAGNOSIS — G629 Polyneuropathy, unspecified: Secondary | ICD-10-CM | POA: Diagnosis not present

## 2024-01-24 DIAGNOSIS — I700 Atherosclerosis of aorta: Secondary | ICD-10-CM | POA: Diagnosis not present

## 2024-01-24 DIAGNOSIS — E785 Hyperlipidemia, unspecified: Secondary | ICD-10-CM | POA: Diagnosis not present

## 2024-01-24 DIAGNOSIS — Z8673 Personal history of transient ischemic attack (TIA), and cerebral infarction without residual deficits: Secondary | ICD-10-CM | POA: Diagnosis not present

## 2024-01-24 DIAGNOSIS — Z7901 Long term (current) use of anticoagulants: Secondary | ICD-10-CM | POA: Diagnosis not present

## 2024-01-24 DIAGNOSIS — Z87891 Personal history of nicotine dependence: Secondary | ICD-10-CM | POA: Diagnosis not present

## 2024-01-24 DIAGNOSIS — I2699 Other pulmonary embolism without acute cor pulmonale: Secondary | ICD-10-CM | POA: Diagnosis not present

## 2024-01-24 DIAGNOSIS — H401120 Primary open-angle glaucoma, left eye, stage unspecified: Secondary | ICD-10-CM | POA: Diagnosis not present

## 2024-01-24 DIAGNOSIS — G8929 Other chronic pain: Secondary | ICD-10-CM | POA: Diagnosis not present

## 2024-01-24 DIAGNOSIS — D631 Anemia in chronic kidney disease: Secondary | ICD-10-CM | POA: Diagnosis not present

## 2024-01-24 DIAGNOSIS — M1990 Unspecified osteoarthritis, unspecified site: Secondary | ICD-10-CM | POA: Diagnosis not present

## 2024-01-24 DIAGNOSIS — M549 Dorsalgia, unspecified: Secondary | ICD-10-CM | POA: Diagnosis not present

## 2024-01-24 DIAGNOSIS — I5022 Chronic systolic (congestive) heart failure: Secondary | ICD-10-CM | POA: Diagnosis not present

## 2024-01-24 DIAGNOSIS — I701 Atherosclerosis of renal artery: Secondary | ICD-10-CM | POA: Diagnosis not present

## 2024-01-24 DIAGNOSIS — M6281 Muscle weakness (generalized): Secondary | ICD-10-CM | POA: Diagnosis not present

## 2024-01-24 DIAGNOSIS — N3281 Overactive bladder: Secondary | ICD-10-CM | POA: Diagnosis not present

## 2024-01-26 DIAGNOSIS — E785 Hyperlipidemia, unspecified: Secondary | ICD-10-CM | POA: Diagnosis not present

## 2024-01-26 DIAGNOSIS — Z8673 Personal history of transient ischemic attack (TIA), and cerebral infarction without residual deficits: Secondary | ICD-10-CM | POA: Diagnosis not present

## 2024-01-26 DIAGNOSIS — D631 Anemia in chronic kidney disease: Secondary | ICD-10-CM | POA: Diagnosis not present

## 2024-01-26 DIAGNOSIS — G629 Polyneuropathy, unspecified: Secondary | ICD-10-CM | POA: Diagnosis not present

## 2024-01-26 DIAGNOSIS — M6281 Muscle weakness (generalized): Secondary | ICD-10-CM | POA: Diagnosis not present

## 2024-01-26 DIAGNOSIS — I2699 Other pulmonary embolism without acute cor pulmonale: Secondary | ICD-10-CM | POA: Diagnosis not present

## 2024-01-26 DIAGNOSIS — Q2112 Patent foramen ovale: Secondary | ICD-10-CM | POA: Diagnosis not present

## 2024-01-26 DIAGNOSIS — G8929 Other chronic pain: Secondary | ICD-10-CM | POA: Diagnosis not present

## 2024-01-26 DIAGNOSIS — H401120 Primary open-angle glaucoma, left eye, stage unspecified: Secondary | ICD-10-CM | POA: Diagnosis not present

## 2024-01-26 DIAGNOSIS — I5022 Chronic systolic (congestive) heart failure: Secondary | ICD-10-CM | POA: Diagnosis not present

## 2024-01-26 DIAGNOSIS — M549 Dorsalgia, unspecified: Secondary | ICD-10-CM | POA: Diagnosis not present

## 2024-01-26 DIAGNOSIS — I130 Hypertensive heart and chronic kidney disease with heart failure and stage 1 through stage 4 chronic kidney disease, or unspecified chronic kidney disease: Secondary | ICD-10-CM | POA: Diagnosis not present

## 2024-01-26 DIAGNOSIS — J449 Chronic obstructive pulmonary disease, unspecified: Secondary | ICD-10-CM | POA: Diagnosis not present

## 2024-01-26 DIAGNOSIS — I701 Atherosclerosis of renal artery: Secondary | ICD-10-CM | POA: Diagnosis not present

## 2024-01-26 DIAGNOSIS — F0393 Unspecified dementia, unspecified severity, with mood disturbance: Secondary | ICD-10-CM

## 2024-01-26 DIAGNOSIS — N3281 Overactive bladder: Secondary | ICD-10-CM | POA: Diagnosis not present

## 2024-01-26 DIAGNOSIS — N183 Chronic kidney disease, stage 3 unspecified: Secondary | ICD-10-CM | POA: Diagnosis not present

## 2024-01-26 DIAGNOSIS — F339 Major depressive disorder, recurrent, unspecified: Secondary | ICD-10-CM

## 2024-01-26 DIAGNOSIS — Z87891 Personal history of nicotine dependence: Secondary | ICD-10-CM | POA: Diagnosis not present

## 2024-01-26 DIAGNOSIS — G4700 Insomnia, unspecified: Secondary | ICD-10-CM | POA: Diagnosis not present

## 2024-01-26 DIAGNOSIS — M1990 Unspecified osteoarthritis, unspecified site: Secondary | ICD-10-CM | POA: Diagnosis not present

## 2024-01-26 DIAGNOSIS — I700 Atherosclerosis of aorta: Secondary | ICD-10-CM | POA: Diagnosis not present

## 2024-01-26 DIAGNOSIS — Z7901 Long term (current) use of anticoagulants: Secondary | ICD-10-CM | POA: Diagnosis not present

## 2024-01-29 DIAGNOSIS — M1990 Unspecified osteoarthritis, unspecified site: Secondary | ICD-10-CM | POA: Diagnosis not present

## 2024-01-29 DIAGNOSIS — N183 Chronic kidney disease, stage 3 unspecified: Secondary | ICD-10-CM | POA: Diagnosis not present

## 2024-01-29 DIAGNOSIS — E785 Hyperlipidemia, unspecified: Secondary | ICD-10-CM | POA: Diagnosis not present

## 2024-01-29 DIAGNOSIS — I701 Atherosclerosis of renal artery: Secondary | ICD-10-CM | POA: Diagnosis not present

## 2024-01-29 DIAGNOSIS — Z8673 Personal history of transient ischemic attack (TIA), and cerebral infarction without residual deficits: Secondary | ICD-10-CM | POA: Diagnosis not present

## 2024-01-29 DIAGNOSIS — I5022 Chronic systolic (congestive) heart failure: Secondary | ICD-10-CM | POA: Diagnosis not present

## 2024-01-29 DIAGNOSIS — G8929 Other chronic pain: Secondary | ICD-10-CM | POA: Diagnosis not present

## 2024-01-29 DIAGNOSIS — Z7901 Long term (current) use of anticoagulants: Secondary | ICD-10-CM | POA: Diagnosis not present

## 2024-01-29 DIAGNOSIS — J449 Chronic obstructive pulmonary disease, unspecified: Secondary | ICD-10-CM | POA: Diagnosis not present

## 2024-01-29 DIAGNOSIS — I2699 Other pulmonary embolism without acute cor pulmonale: Secondary | ICD-10-CM | POA: Diagnosis not present

## 2024-01-29 DIAGNOSIS — Z87891 Personal history of nicotine dependence: Secondary | ICD-10-CM | POA: Diagnosis not present

## 2024-01-29 DIAGNOSIS — H401120 Primary open-angle glaucoma, left eye, stage unspecified: Secondary | ICD-10-CM | POA: Diagnosis not present

## 2024-01-29 DIAGNOSIS — G629 Polyneuropathy, unspecified: Secondary | ICD-10-CM | POA: Diagnosis not present

## 2024-01-29 DIAGNOSIS — M549 Dorsalgia, unspecified: Secondary | ICD-10-CM | POA: Diagnosis not present

## 2024-01-29 DIAGNOSIS — G4700 Insomnia, unspecified: Secondary | ICD-10-CM | POA: Diagnosis not present

## 2024-01-29 DIAGNOSIS — I130 Hypertensive heart and chronic kidney disease with heart failure and stage 1 through stage 4 chronic kidney disease, or unspecified chronic kidney disease: Secondary | ICD-10-CM | POA: Diagnosis not present

## 2024-01-29 DIAGNOSIS — M6281 Muscle weakness (generalized): Secondary | ICD-10-CM | POA: Diagnosis not present

## 2024-01-29 DIAGNOSIS — N3281 Overactive bladder: Secondary | ICD-10-CM | POA: Diagnosis not present

## 2024-01-29 DIAGNOSIS — I700 Atherosclerosis of aorta: Secondary | ICD-10-CM | POA: Diagnosis not present

## 2024-01-29 DIAGNOSIS — Q2112 Patent foramen ovale: Secondary | ICD-10-CM | POA: Diagnosis not present

## 2024-01-29 DIAGNOSIS — D631 Anemia in chronic kidney disease: Secondary | ICD-10-CM | POA: Diagnosis not present

## 2024-01-30 ENCOUNTER — Telehealth: Payer: Self-pay

## 2024-01-30 ENCOUNTER — Telehealth: Payer: Self-pay | Admitting: Family Medicine

## 2024-01-30 DIAGNOSIS — H401120 Primary open-angle glaucoma, left eye, stage unspecified: Secondary | ICD-10-CM | POA: Diagnosis not present

## 2024-01-30 DIAGNOSIS — I700 Atherosclerosis of aorta: Secondary | ICD-10-CM | POA: Diagnosis not present

## 2024-01-30 DIAGNOSIS — M1990 Unspecified osteoarthritis, unspecified site: Secondary | ICD-10-CM | POA: Diagnosis not present

## 2024-01-30 DIAGNOSIS — M549 Dorsalgia, unspecified: Secondary | ICD-10-CM | POA: Diagnosis not present

## 2024-01-30 DIAGNOSIS — I130 Hypertensive heart and chronic kidney disease with heart failure and stage 1 through stage 4 chronic kidney disease, or unspecified chronic kidney disease: Secondary | ICD-10-CM | POA: Diagnosis not present

## 2024-01-30 DIAGNOSIS — G629 Polyneuropathy, unspecified: Secondary | ICD-10-CM | POA: Diagnosis not present

## 2024-01-30 DIAGNOSIS — Q2112 Patent foramen ovale: Secondary | ICD-10-CM | POA: Diagnosis not present

## 2024-01-30 DIAGNOSIS — G4700 Insomnia, unspecified: Secondary | ICD-10-CM | POA: Diagnosis not present

## 2024-01-30 DIAGNOSIS — Z87891 Personal history of nicotine dependence: Secondary | ICD-10-CM | POA: Diagnosis not present

## 2024-01-30 DIAGNOSIS — I701 Atherosclerosis of renal artery: Secondary | ICD-10-CM | POA: Diagnosis not present

## 2024-01-30 DIAGNOSIS — G8929 Other chronic pain: Secondary | ICD-10-CM | POA: Diagnosis not present

## 2024-01-30 DIAGNOSIS — I2699 Other pulmonary embolism without acute cor pulmonale: Secondary | ICD-10-CM | POA: Diagnosis not present

## 2024-01-30 DIAGNOSIS — E785 Hyperlipidemia, unspecified: Secondary | ICD-10-CM | POA: Diagnosis not present

## 2024-01-30 DIAGNOSIS — M6281 Muscle weakness (generalized): Secondary | ICD-10-CM | POA: Diagnosis not present

## 2024-01-30 DIAGNOSIS — D631 Anemia in chronic kidney disease: Secondary | ICD-10-CM | POA: Diagnosis not present

## 2024-01-30 DIAGNOSIS — Z7901 Long term (current) use of anticoagulants: Secondary | ICD-10-CM | POA: Diagnosis not present

## 2024-01-30 DIAGNOSIS — N183 Chronic kidney disease, stage 3 unspecified: Secondary | ICD-10-CM | POA: Diagnosis not present

## 2024-01-30 DIAGNOSIS — Z8673 Personal history of transient ischemic attack (TIA), and cerebral infarction without residual deficits: Secondary | ICD-10-CM | POA: Diagnosis not present

## 2024-01-30 DIAGNOSIS — J449 Chronic obstructive pulmonary disease, unspecified: Secondary | ICD-10-CM | POA: Diagnosis not present

## 2024-01-30 DIAGNOSIS — N3281 Overactive bladder: Secondary | ICD-10-CM | POA: Diagnosis not present

## 2024-01-30 DIAGNOSIS — I5022 Chronic systolic (congestive) heart failure: Secondary | ICD-10-CM | POA: Diagnosis not present

## 2024-01-30 NOTE — Telephone Encounter (Unsigned)
 Copied from CRM [id number]. Topic: General - Call Back - No Documentation >> Jan 30, 2024 12:51 PM Precious C wrote: Reason for CRM: Pt's daughter [name] called in because she received a call from doctor's office and was just returning the call, stated she can  be called her at her work number [phone] and she will be available until 2:30pm today.

## 2024-01-30 NOTE — Telephone Encounter (Signed)
 Copied from CRM [id number]. Topic: Clinical - Medical Advice >> Jan 30, 2024  7:53 AM Carlatta H wrote: Reason for CRM: Please call patients daughter [name] about mom getting cleared on if she can driver or not//She would like to know if you do driving assessments//Please call Mr Leighton Punches at [phone] and if you can't get him call [name] [phone]

## 2024-01-30 NOTE — Telephone Encounter (Signed)
 Copied from CRM [id number]. Topic: Clinical - Home Health Verbal Orders >> Jan 30, 2024  1:59 PM Alica Antu wrote: Caller/Agency: Cain Castillo from home health  Callback Number: [phone] Service Requested: Soical Service referral  Frequency: initial  Any new concerns about the patient? No; per [facility] they want to send soical worker out to get her medication issue controlled.

## 2024-02-05 DIAGNOSIS — I701 Atherosclerosis of renal artery: Secondary | ICD-10-CM | POA: Diagnosis not present

## 2024-02-05 DIAGNOSIS — E785 Hyperlipidemia, unspecified: Secondary | ICD-10-CM | POA: Diagnosis not present

## 2024-02-05 DIAGNOSIS — I5022 Chronic systolic (congestive) heart failure: Secondary | ICD-10-CM | POA: Diagnosis not present

## 2024-02-05 DIAGNOSIS — M1990 Unspecified osteoarthritis, unspecified site: Secondary | ICD-10-CM | POA: Diagnosis not present

## 2024-02-05 DIAGNOSIS — N183 Chronic kidney disease, stage 3 unspecified: Secondary | ICD-10-CM | POA: Diagnosis not present

## 2024-02-05 DIAGNOSIS — I700 Atherosclerosis of aorta: Secondary | ICD-10-CM | POA: Diagnosis not present

## 2024-02-05 DIAGNOSIS — J449 Chronic obstructive pulmonary disease, unspecified: Secondary | ICD-10-CM | POA: Diagnosis not present

## 2024-02-05 DIAGNOSIS — I130 Hypertensive heart and chronic kidney disease with heart failure and stage 1 through stage 4 chronic kidney disease, or unspecified chronic kidney disease: Secondary | ICD-10-CM | POA: Diagnosis not present

## 2024-02-05 DIAGNOSIS — Z8673 Personal history of transient ischemic attack (TIA), and cerebral infarction without residual deficits: Secondary | ICD-10-CM | POA: Diagnosis not present

## 2024-02-05 DIAGNOSIS — Q2112 Patent foramen ovale: Secondary | ICD-10-CM | POA: Diagnosis not present

## 2024-02-05 DIAGNOSIS — M6281 Muscle weakness (generalized): Secondary | ICD-10-CM | POA: Diagnosis not present

## 2024-02-05 DIAGNOSIS — Z87891 Personal history of nicotine dependence: Secondary | ICD-10-CM | POA: Diagnosis not present

## 2024-02-05 DIAGNOSIS — N3281 Overactive bladder: Secondary | ICD-10-CM | POA: Diagnosis not present

## 2024-02-05 DIAGNOSIS — D631 Anemia in chronic kidney disease: Secondary | ICD-10-CM | POA: Diagnosis not present

## 2024-02-05 DIAGNOSIS — H401120 Primary open-angle glaucoma, left eye, stage unspecified: Secondary | ICD-10-CM | POA: Diagnosis not present

## 2024-02-05 DIAGNOSIS — Z7901 Long term (current) use of anticoagulants: Secondary | ICD-10-CM | POA: Diagnosis not present

## 2024-02-05 DIAGNOSIS — G4700 Insomnia, unspecified: Secondary | ICD-10-CM | POA: Diagnosis not present

## 2024-02-05 DIAGNOSIS — G8929 Other chronic pain: Secondary | ICD-10-CM | POA: Diagnosis not present

## 2024-02-05 DIAGNOSIS — M549 Dorsalgia, unspecified: Secondary | ICD-10-CM | POA: Diagnosis not present

## 2024-02-05 DIAGNOSIS — I2699 Other pulmonary embolism without acute cor pulmonale: Secondary | ICD-10-CM | POA: Diagnosis not present

## 2024-02-05 DIAGNOSIS — G629 Polyneuropathy, unspecified: Secondary | ICD-10-CM | POA: Diagnosis not present

## 2024-02-06 ENCOUNTER — Telehealth: Payer: Self-pay | Admitting: Family Medicine

## 2024-02-06 NOTE — Telephone Encounter (Signed)
 Copied from CRM [id number]. Topic: General - Other >> Feb 01, 2024  8:17 AM Silvana PARAS wrote: Reason for CRM: Patients daughter, Delon Dawn calling in regards to her mom getting cleared on if she can driver or not. She would like to know if you do driving assessments. Please call Mr Dawn at [phone] and if you can't get him call Delon [phone]. Her call is [phone].

## 2024-02-08 ENCOUNTER — Telehealth: Payer: Self-pay

## 2024-02-08 DIAGNOSIS — Z87891 Personal history of nicotine dependence: Secondary | ICD-10-CM | POA: Diagnosis not present

## 2024-02-08 DIAGNOSIS — I5022 Chronic systolic (congestive) heart failure: Secondary | ICD-10-CM | POA: Diagnosis not present

## 2024-02-08 DIAGNOSIS — Q2112 Patent foramen ovale: Secondary | ICD-10-CM | POA: Diagnosis not present

## 2024-02-08 DIAGNOSIS — Z8673 Personal history of transient ischemic attack (TIA), and cerebral infarction without residual deficits: Secondary | ICD-10-CM | POA: Diagnosis not present

## 2024-02-08 DIAGNOSIS — M549 Dorsalgia, unspecified: Secondary | ICD-10-CM | POA: Diagnosis not present

## 2024-02-08 DIAGNOSIS — Z7901 Long term (current) use of anticoagulants: Secondary | ICD-10-CM | POA: Diagnosis not present

## 2024-02-08 DIAGNOSIS — M1990 Unspecified osteoarthritis, unspecified site: Secondary | ICD-10-CM | POA: Diagnosis not present

## 2024-02-08 DIAGNOSIS — H401120 Primary open-angle glaucoma, left eye, stage unspecified: Secondary | ICD-10-CM | POA: Diagnosis not present

## 2024-02-08 DIAGNOSIS — E785 Hyperlipidemia, unspecified: Secondary | ICD-10-CM | POA: Diagnosis not present

## 2024-02-08 DIAGNOSIS — G4700 Insomnia, unspecified: Secondary | ICD-10-CM | POA: Diagnosis not present

## 2024-02-08 DIAGNOSIS — G8929 Other chronic pain: Secondary | ICD-10-CM | POA: Diagnosis not present

## 2024-02-08 DIAGNOSIS — I2699 Other pulmonary embolism without acute cor pulmonale: Secondary | ICD-10-CM | POA: Diagnosis not present

## 2024-02-08 DIAGNOSIS — I701 Atherosclerosis of renal artery: Secondary | ICD-10-CM | POA: Diagnosis not present

## 2024-02-08 DIAGNOSIS — J449 Chronic obstructive pulmonary disease, unspecified: Secondary | ICD-10-CM | POA: Diagnosis not present

## 2024-02-08 DIAGNOSIS — D631 Anemia in chronic kidney disease: Secondary | ICD-10-CM | POA: Diagnosis not present

## 2024-02-08 DIAGNOSIS — N3281 Overactive bladder: Secondary | ICD-10-CM | POA: Diagnosis not present

## 2024-02-08 DIAGNOSIS — G629 Polyneuropathy, unspecified: Secondary | ICD-10-CM | POA: Diagnosis not present

## 2024-02-08 DIAGNOSIS — I130 Hypertensive heart and chronic kidney disease with heart failure and stage 1 through stage 4 chronic kidney disease, or unspecified chronic kidney disease: Secondary | ICD-10-CM | POA: Diagnosis not present

## 2024-02-08 DIAGNOSIS — I700 Atherosclerosis of aorta: Secondary | ICD-10-CM | POA: Diagnosis not present

## 2024-02-08 DIAGNOSIS — N183 Chronic kidney disease, stage 3 unspecified: Secondary | ICD-10-CM | POA: Diagnosis not present

## 2024-02-08 DIAGNOSIS — M6281 Muscle weakness (generalized): Secondary | ICD-10-CM | POA: Diagnosis not present

## 2024-02-08 NOTE — Telephone Encounter (Signed)
 Daughter informed of letter that will be mailed. Also of information to drivers testing in [place],

## 2024-02-08 NOTE — Telephone Encounter (Signed)
 Copied from CRM [id number]. Topic: Clinical - Medication Question >> Feb 08, 2024 10:34 AM Winona SAUNDERS wrote: Reena from [facility] health calling to reconcile a medication list as pt has meloxicam , but it is not on her med list please fax a reconciled med list to [phone]. You may also call [phone] line secure you may leave a message. Pt is also suppose to have amLODipinebut as per her list but does not have it with her.

## 2024-02-09 NOTE — Telephone Encounter (Signed)
Faxed to [facility]

## 2024-02-12 DIAGNOSIS — I2699 Other pulmonary embolism without acute cor pulmonale: Secondary | ICD-10-CM | POA: Diagnosis not present

## 2024-02-12 DIAGNOSIS — E785 Hyperlipidemia, unspecified: Secondary | ICD-10-CM | POA: Diagnosis not present

## 2024-02-12 DIAGNOSIS — Q2112 Patent foramen ovale: Secondary | ICD-10-CM | POA: Diagnosis not present

## 2024-02-12 DIAGNOSIS — Z8673 Personal history of transient ischemic attack (TIA), and cerebral infarction without residual deficits: Secondary | ICD-10-CM | POA: Diagnosis not present

## 2024-02-12 DIAGNOSIS — I701 Atherosclerosis of renal artery: Secondary | ICD-10-CM | POA: Diagnosis not present

## 2024-02-12 DIAGNOSIS — M6281 Muscle weakness (generalized): Secondary | ICD-10-CM | POA: Diagnosis not present

## 2024-02-12 DIAGNOSIS — M549 Dorsalgia, unspecified: Secondary | ICD-10-CM | POA: Diagnosis not present

## 2024-02-12 DIAGNOSIS — H401120 Primary open-angle glaucoma, left eye, stage unspecified: Secondary | ICD-10-CM | POA: Diagnosis not present

## 2024-02-12 DIAGNOSIS — N183 Chronic kidney disease, stage 3 unspecified: Secondary | ICD-10-CM | POA: Diagnosis not present

## 2024-02-12 DIAGNOSIS — M1990 Unspecified osteoarthritis, unspecified site: Secondary | ICD-10-CM | POA: Diagnosis not present

## 2024-02-12 DIAGNOSIS — I130 Hypertensive heart and chronic kidney disease with heart failure and stage 1 through stage 4 chronic kidney disease, or unspecified chronic kidney disease: Secondary | ICD-10-CM | POA: Diagnosis not present

## 2024-02-12 DIAGNOSIS — J449 Chronic obstructive pulmonary disease, unspecified: Secondary | ICD-10-CM | POA: Diagnosis not present

## 2024-02-12 DIAGNOSIS — G8929 Other chronic pain: Secondary | ICD-10-CM | POA: Diagnosis not present

## 2024-02-12 DIAGNOSIS — I700 Atherosclerosis of aorta: Secondary | ICD-10-CM | POA: Diagnosis not present

## 2024-02-12 DIAGNOSIS — G629 Polyneuropathy, unspecified: Secondary | ICD-10-CM | POA: Diagnosis not present

## 2024-02-12 DIAGNOSIS — N3281 Overactive bladder: Secondary | ICD-10-CM | POA: Diagnosis not present

## 2024-02-12 DIAGNOSIS — G4700 Insomnia, unspecified: Secondary | ICD-10-CM | POA: Diagnosis not present

## 2024-02-12 DIAGNOSIS — Z7901 Long term (current) use of anticoagulants: Secondary | ICD-10-CM | POA: Diagnosis not present

## 2024-02-12 DIAGNOSIS — I5022 Chronic systolic (congestive) heart failure: Secondary | ICD-10-CM | POA: Diagnosis not present

## 2024-02-12 DIAGNOSIS — Z87891 Personal history of nicotine dependence: Secondary | ICD-10-CM | POA: Diagnosis not present

## 2024-02-12 DIAGNOSIS — D631 Anemia in chronic kidney disease: Secondary | ICD-10-CM | POA: Diagnosis not present

## 2024-02-13 DIAGNOSIS — I2699 Other pulmonary embolism without acute cor pulmonale: Secondary | ICD-10-CM | POA: Diagnosis not present

## 2024-02-13 DIAGNOSIS — G8929 Other chronic pain: Secondary | ICD-10-CM | POA: Diagnosis not present

## 2024-02-13 DIAGNOSIS — H401120 Primary open-angle glaucoma, left eye, stage unspecified: Secondary | ICD-10-CM | POA: Diagnosis not present

## 2024-02-13 DIAGNOSIS — G4700 Insomnia, unspecified: Secondary | ICD-10-CM | POA: Diagnosis not present

## 2024-02-13 DIAGNOSIS — I700 Atherosclerosis of aorta: Secondary | ICD-10-CM | POA: Diagnosis not present

## 2024-02-13 DIAGNOSIS — Z7901 Long term (current) use of anticoagulants: Secondary | ICD-10-CM | POA: Diagnosis not present

## 2024-02-13 DIAGNOSIS — G629 Polyneuropathy, unspecified: Secondary | ICD-10-CM | POA: Diagnosis not present

## 2024-02-13 DIAGNOSIS — Q2112 Patent foramen ovale: Secondary | ICD-10-CM | POA: Diagnosis not present

## 2024-02-13 DIAGNOSIS — M1990 Unspecified osteoarthritis, unspecified site: Secondary | ICD-10-CM | POA: Diagnosis not present

## 2024-02-13 DIAGNOSIS — D631 Anemia in chronic kidney disease: Secondary | ICD-10-CM | POA: Diagnosis not present

## 2024-02-13 DIAGNOSIS — Z87891 Personal history of nicotine dependence: Secondary | ICD-10-CM | POA: Diagnosis not present

## 2024-02-13 DIAGNOSIS — I5022 Chronic systolic (congestive) heart failure: Secondary | ICD-10-CM | POA: Diagnosis not present

## 2024-02-13 DIAGNOSIS — N183 Chronic kidney disease, stage 3 unspecified: Secondary | ICD-10-CM | POA: Diagnosis not present

## 2024-02-13 DIAGNOSIS — J449 Chronic obstructive pulmonary disease, unspecified: Secondary | ICD-10-CM | POA: Diagnosis not present

## 2024-02-13 DIAGNOSIS — I130 Hypertensive heart and chronic kidney disease with heart failure and stage 1 through stage 4 chronic kidney disease, or unspecified chronic kidney disease: Secondary | ICD-10-CM | POA: Diagnosis not present

## 2024-02-13 DIAGNOSIS — Z8673 Personal history of transient ischemic attack (TIA), and cerebral infarction without residual deficits: Secondary | ICD-10-CM | POA: Diagnosis not present

## 2024-02-13 DIAGNOSIS — I701 Atherosclerosis of renal artery: Secondary | ICD-10-CM | POA: Diagnosis not present

## 2024-02-13 DIAGNOSIS — N3281 Overactive bladder: Secondary | ICD-10-CM | POA: Diagnosis not present

## 2024-02-13 DIAGNOSIS — M549 Dorsalgia, unspecified: Secondary | ICD-10-CM | POA: Diagnosis not present

## 2024-02-13 DIAGNOSIS — E785 Hyperlipidemia, unspecified: Secondary | ICD-10-CM | POA: Diagnosis not present

## 2024-02-13 DIAGNOSIS — M6281 Muscle weakness (generalized): Secondary | ICD-10-CM | POA: Diagnosis not present

## 2024-02-19 DIAGNOSIS — G8929 Other chronic pain: Secondary | ICD-10-CM | POA: Diagnosis not present

## 2024-02-19 DIAGNOSIS — M1990 Unspecified osteoarthritis, unspecified site: Secondary | ICD-10-CM | POA: Diagnosis not present

## 2024-02-19 DIAGNOSIS — I701 Atherosclerosis of renal artery: Secondary | ICD-10-CM | POA: Diagnosis not present

## 2024-02-19 DIAGNOSIS — D631 Anemia in chronic kidney disease: Secondary | ICD-10-CM | POA: Diagnosis not present

## 2024-02-19 DIAGNOSIS — I5022 Chronic systolic (congestive) heart failure: Secondary | ICD-10-CM | POA: Diagnosis not present

## 2024-02-19 DIAGNOSIS — Z87891 Personal history of nicotine dependence: Secondary | ICD-10-CM | POA: Diagnosis not present

## 2024-02-19 DIAGNOSIS — N183 Chronic kidney disease, stage 3 unspecified: Secondary | ICD-10-CM | POA: Diagnosis not present

## 2024-02-19 DIAGNOSIS — Z8673 Personal history of transient ischemic attack (TIA), and cerebral infarction without residual deficits: Secondary | ICD-10-CM | POA: Diagnosis not present

## 2024-02-19 DIAGNOSIS — I700 Atherosclerosis of aorta: Secondary | ICD-10-CM | POA: Diagnosis not present

## 2024-02-19 DIAGNOSIS — I2699 Other pulmonary embolism without acute cor pulmonale: Secondary | ICD-10-CM | POA: Diagnosis not present

## 2024-02-19 DIAGNOSIS — G629 Polyneuropathy, unspecified: Secondary | ICD-10-CM | POA: Diagnosis not present

## 2024-02-19 DIAGNOSIS — E785 Hyperlipidemia, unspecified: Secondary | ICD-10-CM | POA: Diagnosis not present

## 2024-02-19 DIAGNOSIS — H401120 Primary open-angle glaucoma, left eye, stage unspecified: Secondary | ICD-10-CM | POA: Diagnosis not present

## 2024-02-19 DIAGNOSIS — Z7901 Long term (current) use of anticoagulants: Secondary | ICD-10-CM | POA: Diagnosis not present

## 2024-02-19 DIAGNOSIS — J449 Chronic obstructive pulmonary disease, unspecified: Secondary | ICD-10-CM | POA: Diagnosis not present

## 2024-02-19 DIAGNOSIS — I130 Hypertensive heart and chronic kidney disease with heart failure and stage 1 through stage 4 chronic kidney disease, or unspecified chronic kidney disease: Secondary | ICD-10-CM | POA: Diagnosis not present

## 2024-02-19 DIAGNOSIS — N3281 Overactive bladder: Secondary | ICD-10-CM | POA: Diagnosis not present

## 2024-02-19 DIAGNOSIS — G4700 Insomnia, unspecified: Secondary | ICD-10-CM | POA: Diagnosis not present

## 2024-02-19 DIAGNOSIS — Q2112 Patent foramen ovale: Secondary | ICD-10-CM | POA: Diagnosis not present

## 2024-02-19 DIAGNOSIS — M6281 Muscle weakness (generalized): Secondary | ICD-10-CM | POA: Diagnosis not present

## 2024-02-19 DIAGNOSIS — M549 Dorsalgia, unspecified: Secondary | ICD-10-CM | POA: Diagnosis not present

## 2024-02-20 DIAGNOSIS — Z8673 Personal history of transient ischemic attack (TIA), and cerebral infarction without residual deficits: Secondary | ICD-10-CM | POA: Diagnosis not present

## 2024-02-20 DIAGNOSIS — Z87891 Personal history of nicotine dependence: Secondary | ICD-10-CM | POA: Diagnosis not present

## 2024-02-20 DIAGNOSIS — H401120 Primary open-angle glaucoma, left eye, stage unspecified: Secondary | ICD-10-CM | POA: Diagnosis not present

## 2024-02-20 DIAGNOSIS — M1990 Unspecified osteoarthritis, unspecified site: Secondary | ICD-10-CM | POA: Diagnosis not present

## 2024-02-20 DIAGNOSIS — D631 Anemia in chronic kidney disease: Secondary | ICD-10-CM | POA: Diagnosis not present

## 2024-02-20 DIAGNOSIS — E785 Hyperlipidemia, unspecified: Secondary | ICD-10-CM | POA: Diagnosis not present

## 2024-02-20 DIAGNOSIS — I2699 Other pulmonary embolism without acute cor pulmonale: Secondary | ICD-10-CM | POA: Diagnosis not present

## 2024-02-20 DIAGNOSIS — J449 Chronic obstructive pulmonary disease, unspecified: Secondary | ICD-10-CM | POA: Diagnosis not present

## 2024-02-20 DIAGNOSIS — Q2112 Patent foramen ovale: Secondary | ICD-10-CM | POA: Diagnosis not present

## 2024-02-20 DIAGNOSIS — I700 Atherosclerosis of aorta: Secondary | ICD-10-CM | POA: Diagnosis not present

## 2024-02-20 DIAGNOSIS — N3281 Overactive bladder: Secondary | ICD-10-CM | POA: Diagnosis not present

## 2024-02-20 DIAGNOSIS — G4700 Insomnia, unspecified: Secondary | ICD-10-CM | POA: Diagnosis not present

## 2024-02-20 DIAGNOSIS — I701 Atherosclerosis of renal artery: Secondary | ICD-10-CM | POA: Diagnosis not present

## 2024-02-20 DIAGNOSIS — I5022 Chronic systolic (congestive) heart failure: Secondary | ICD-10-CM | POA: Diagnosis not present

## 2024-02-20 DIAGNOSIS — N183 Chronic kidney disease, stage 3 unspecified: Secondary | ICD-10-CM | POA: Diagnosis not present

## 2024-02-20 DIAGNOSIS — Z7901 Long term (current) use of anticoagulants: Secondary | ICD-10-CM | POA: Diagnosis not present

## 2024-02-20 DIAGNOSIS — M549 Dorsalgia, unspecified: Secondary | ICD-10-CM | POA: Diagnosis not present

## 2024-02-20 DIAGNOSIS — G8929 Other chronic pain: Secondary | ICD-10-CM | POA: Diagnosis not present

## 2024-02-20 DIAGNOSIS — I130 Hypertensive heart and chronic kidney disease with heart failure and stage 1 through stage 4 chronic kidney disease, or unspecified chronic kidney disease: Secondary | ICD-10-CM | POA: Diagnosis not present

## 2024-02-20 DIAGNOSIS — M6281 Muscle weakness (generalized): Secondary | ICD-10-CM | POA: Diagnosis not present

## 2024-02-20 DIAGNOSIS — G629 Polyneuropathy, unspecified: Secondary | ICD-10-CM | POA: Diagnosis not present

## 2024-02-21 ENCOUNTER — Telehealth: Payer: Self-pay | Admitting: Family Medicine

## 2024-02-21 NOTE — Telephone Encounter (Signed)
 Copied from CRM [id number]. Topic: Clinical - Medication Question >> Feb 21, 2024  3:38 PM Carlatta H wrote: Reason for CRM: Please call Dolly at [phone]// would like to know of any alternatives medication to apixaban  (ELIQUIS ) 5 MG TABS tablet [[id number]]//Its to expensive for the patient

## 2024-02-22 ENCOUNTER — Telehealth: Payer: Self-pay | Admitting: Family Medicine

## 2024-02-22 NOTE — Telephone Encounter (Signed)
Phone keeps ringing

## 2024-02-22 NOTE — Telephone Encounter (Signed)
 lvm

## 2024-02-22 NOTE — Telephone Encounter (Signed)
 Spoke with Sandra and gave her the needed information.

## 2024-02-22 NOTE — Telephone Encounter (Signed)
 Copied from CRM [id number]. Topic: Clinical - Prescription Issue >> Feb 22, 2024  1:27 PM Ivette P wrote: Reason for CRM: Nena from [facility] called in to report that some medication is missing from the pt home that were on the home health chart and Nena would [facility] for the nurse to follow up with her today to verify what medication are needed so they can make the family aware.  Nena [phone] - secured line

## 2024-02-23 ENCOUNTER — Other Ambulatory Visit: Payer: Self-pay | Admitting: Family Medicine

## 2024-02-23 ENCOUNTER — Inpatient Hospital Stay

## 2024-02-23 ENCOUNTER — Inpatient Hospital Stay: Attending: Internal Medicine

## 2024-02-23 ENCOUNTER — Inpatient Hospital Stay (HOSPITAL_BASED_OUTPATIENT_CLINIC_OR_DEPARTMENT_OTHER): Admitting: Internal Medicine

## 2024-02-23 ENCOUNTER — Encounter: Payer: Self-pay | Admitting: Internal Medicine

## 2024-02-23 VITALS — BP 157/60

## 2024-02-23 DIAGNOSIS — D649 Anemia, unspecified: Secondary | ICD-10-CM

## 2024-02-23 DIAGNOSIS — D500 Iron deficiency anemia secondary to blood loss (chronic): Secondary | ICD-10-CM | POA: Diagnosis not present

## 2024-02-23 DIAGNOSIS — K5520 Angiodysplasia of colon without hemorrhage: Secondary | ICD-10-CM | POA: Diagnosis not present

## 2024-02-23 LAB — CBC WITH DIFFERENTIAL (CANCER CENTER ONLY)
Abs Immature Granulocytes: 0.02 K/uL (ref 0.00–0.07)
Basophils Absolute: 0.1 K/uL (ref 0.0–0.1)
Basophils Relative: 1 %
Eosinophils Absolute: 0.5 K/uL (ref 0.0–0.5)
Eosinophils Relative: 8 %
HCT: 39.6 % (ref 36.0–46.0)
Hemoglobin: 12.9 g/dL (ref 12.0–15.0)
Immature Granulocytes: 0 %
Lymphocytes Relative: 22 %
Lymphs Abs: 1.5 K/uL (ref 0.7–4.0)
MCH: 28.4 pg (ref 26.0–34.0)
MCHC: 32.6 g/dL (ref 30.0–36.0)
MCV: 87.2 fL (ref 80.0–100.0)
Monocytes Absolute: 0.5 K/uL (ref 0.1–1.0)
Monocytes Relative: 8 %
Neutro Abs: 4.1 K/uL (ref 1.7–7.7)
Neutrophils Relative %: 61 %
Platelet Count: 196 K/uL (ref 150–400)
RBC: 4.54 MIL/uL (ref 3.87–5.11)
RDW: 16.4 % — ABNORMAL HIGH (ref 11.5–15.5)
WBC Count: 6.7 K/uL (ref 4.0–10.5)
nRBC: 0 % (ref 0.0–0.2)

## 2024-02-23 LAB — CMP (CANCER CENTER ONLY)
ALT: 17 U/L (ref 0–44)
AST: 19 U/L (ref 15–41)
Albumin: 3.8 g/dL (ref 3.5–5.0)
Alkaline Phosphatase: 59 U/L (ref 38–126)
Anion gap: 9 (ref 5–15)
BUN: 15 mg/dL (ref 8–23)
CO2: 22 mmol/L (ref 22–32)
Calcium: 9.3 mg/dL (ref 8.9–10.3)
Chloride: 106 mmol/L (ref 98–111)
Creatinine: 1.13 mg/dL — ABNORMAL HIGH (ref 0.44–1.00)
GFR, Estimated: 49 mL/min — ABNORMAL LOW (ref >60)
Glucose, Bld: 104 mg/dL — ABNORMAL HIGH (ref 70–99)
Potassium: 3.5 mmol/L (ref 3.5–5.1)
Sodium: 137 mmol/L (ref 135–145)
Total Bilirubin: 0.7 mg/dL (ref 0.0–1.2)
Total Protein: 6.9 g/dL (ref 6.5–8.1)

## 2024-02-23 LAB — IRON AND TIBC
Iron: 44 ug/dL (ref 28–170)
Saturation Ratios: 13 % (ref 10.4–31.8)
TIBC: 353 ug/dL (ref 250–450)
UIBC: 309 ug/dL

## 2024-02-23 LAB — FERRITIN: Ferritin: 16 ng/mL (ref 11–307)

## 2024-02-23 MED ORDER — AMLODIPINE BESYLATE 5 MG PO TABS: 5.0000 mg | ORAL_TABLET | Freq: Every day | ORAL | 3 refills | Status: AC

## 2024-02-23 MED ORDER — IRON SUCROSE 20 MG/ML IV SOLN
200.0000 mg | Freq: Once | INTRAVENOUS | Status: AC
  Administered 2024-02-23: 200 mg via INTRAVENOUS

## 2024-02-23 NOTE — Progress Notes (Signed)
 Patient presents today with no new or acute concerns.  Patient also was not able to relay her medications, unsure if medication list is up to date, but thinks it is accurate.

## 2024-02-23 NOTE — Progress Notes (Signed)
 [facility] CONSULT NOTE  Patient Care Team: Duanne Butler DASEN, MD as PCP - General (Family Medicine) Donette Ellouise LABOR, FNP as Nurse Practitioner (Family Medicine) Vivienne Lonni Ingle, NP as Nurse Practitioner (Cardiology) Nicholaus Sherlean CROME, [facility] (Inactive) as Pharmacist (Pharmacist) Rennie Cindy SAUNDERS, MD as Consulting Physician (Oncology)  CHIEF COMPLAINTS/PURPOSE OF CONSULTATION: Anemia.    Latest Reference Range & Units 11/14/23 08:58  Iron  28 - 170 ug/dL 9 (L)  UIBC ug/dL 531  TIBC 749 - 549 ug/dL 522 (H)  Saturation Ratios 10.4 - 31.8 % 2 (L)  Ferritin 11 - 307 ng/mL 2 (L)  Folate >5.9 ng/mL >40.0  (L): Data is abnormally low (H): Data is abnormally high  HISTORY OF PRESENTING ILLNESS: Patient ambulating-in wheel chair with assistance.  Alone/Accompanied by care giver- [facility]  Virginia Walters 83 y.o.  female pleasant patient with a multiple medical problems including CHF COPD-and with severe anemia sec to chronic GIB- AVMs /IDA is here for follow-up.  patient s/p venofer . No reactions.    Patient noted to have improvement of shortness of breath with exertion and  excessive fatigue.    Review of Systems  Constitutional:  Positive for malaise/fatigue. Negative for chills, diaphoresis, fever and weight loss.  HENT:  Negative for nosebleeds and sore throat.   Eyes:  Negative for double vision.  Respiratory:  Negative for cough, hemoptysis, sputum production, shortness of breath and wheezing.   Cardiovascular:  Negative for chest pain, palpitations, orthopnea and leg swelling.  Gastrointestinal:  Negative for abdominal pain, blood in stool, constipation, diarrhea, heartburn, melena, nausea and vomiting.  Genitourinary:  Negative for dysuria, frequency and urgency.  Musculoskeletal:  Positive for back pain and joint pain.  Skin: Negative.  Negative for itching and rash.  Neurological:  Positive for weakness. Negative for dizziness, tingling,  focal weakness and headaches.  Endo/Heme/Allergies:  Does not bruise/bleed easily.  Psychiatric/Behavioral:  Negative for depression. The patient is not nervous/anxious and does not have insomnia.     MEDICAL HISTORY:  Past Medical History:  Diagnosis Date   (HFpEF) heart failure with preserved ejection fraction (HCC)    a.) TTE 07/22/2018: EF 45-50%; mild LVH; mild diffuse HK; mild LA dilation; PASP 30-35 mmHg; G1DD.   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   Weakness     SURGICAL HISTORY: Past Surgical History:  Procedure Laterality Date   ANKLE ARTHROSCOPY WITH OPEN  REDUCTION INTERNAL FIXATION (ORIF) Right    APPENDECTOMY     CHOLECYSTECTOMY     COLONOSCOPY N/A 07/21/2018   Procedure: COLONOSCOPY;  Surgeon: Unk Corinn Skiff, MD;  Location[facility] ENDOSCOPY;  Service: Gastroenterology;  Laterality: N/A;   COLONOSCOPY N/A 11/20/2023   Procedure: COLONOSCOPY;  Surgeon: Jinny Carmine, MD;  Location: [facility] ENDOSCOPY;  Service: Endoscopy;  Laterality: N/A;   COLONOSCOPY N/A  11/27/2023   Procedure: COLONOSCOPY;  Surgeon: Jinny Carmine, MD;  Location: [facility] ENDOSCOPY;  Service: Endoscopy;  Laterality: N/A;   COLONOSCOPY N/A 12/06/2023   Procedure: COLONOSCOPY;  Surgeon: Jinny Carmine, MD;  Location: [facility] ENDOSCOPY;  Service: Endoscopy;  Laterality: N/A;   COLONOSCOPY WITH PROPOFOL  N/A 04/01/2020   Procedure: COLONOSCOPY WITH PROPOFOL ;  Surgeon: Janalyn Keene NOVAK, MD;  Location: [facility] ENDOSCOPY;  Service: Endoscopy;  Laterality: N/A;   ESOPHAGOGASTRODUODENOSCOPY N/A 07/21/2018   Procedure: ESOPHAGOGASTRODUODENOSCOPY (EGD);  Surgeon: Unk Corinn Skiff, MD;  Location: [facility] ENDOSCOPY;  Service: Gastroenterology;  Laterality: N/A;   ESOPHAGOGASTRODUODENOSCOPY N/A 02/11/2020   Procedure: ESOPHAGOGASTRODUODENOSCOPY (EGD);  Surgeon: Janalyn Keene NOVAK, MD;  Location: [facility] ENDOSCOPY;  Service: Endoscopy;  Laterality: N/A;   ESOPHAGOGASTRODUODENOSCOPY N/A 11/20/2023   Procedure: EGD (ESOPHAGOGASTRODUODENOSCOPY);  Surgeon: Jinny Carmine, MD;  Location: [facility] ENDOSCOPY;  Service: Endoscopy;  Laterality: N/A;   ESOPHAGOGASTRODUODENOSCOPY N/A 11/27/2023   Procedure: EGD (ESOPHAGOGASTRODUODENOSCOPY);  Surgeon: Jinny Carmine, MD;  Location[facility] ENDOSCOPY;  Service: Endoscopy;  Laterality: N/A;   ESOPHAGOGASTRODUODENOSCOPY N/A 12/06/2023   Procedure: EGD (ESOPHAGOGASTRODUODENOSCOPY);  Surgeon: Jinny Carmine, MD;  Location: [facility] ENDOSCOPY;  Service: Endoscopy;  Laterality: N/A;   ESOPHAGOGASTRODUODENOSCOPY (EGD) WITH PROPOFOL  N/A 04/01/2020   Procedure: ESOPHAGOGASTRODUODENOSCOPY (EGD) WITH PROPOFOL ;  Surgeon: Janalyn Keene NOVAK, MD;  Location: [facility] ENDOSCOPY;  Service: Endoscopy;  Laterality: N/A;   FRACTURE SURGERY Left    leg fx   GIVENS CAPSULE STUDY  12/06/2023   Procedure: IMAGING PROCEDURE, GI TRACT, INTRALUMINAL, VIA CAPSULE;  Surgeon: Jinny Carmine, MD;  Location: [facility] ENDOSCOPY;  Service: Endoscopy;;   IR EXCHANGE BILIARY DRAIN  10/22/2021   IR PERC CHOLECYSTOSTOMY  09/17/2021    PULMONARY THROMBECTOMY N/A 12/11/2023   Procedure: PULMONARY THROMBECTOMY;  Surgeon: Jama Cordella MATSU, MD;  Location: [facility] INVASIVE CV LAB;  Service: Cardiovascular;  Laterality: N/A;   TUBAL LIGATION      SOCIAL HISTORY: Social History   Socioeconomic History   Marital status: Widowed    Spouse name: Not on file   Number of children: 1   Years of education: HS   Highest education level: Not on file  Occupational History   Occupation: Retired  Tobacco Use   Smoking status: Former    Current packs/day: 0.00    Average packs/day: 1 pack/day for 15.0 years (15.0 ttl pk-yrs)    Types: Cigarettes    Start date: 12/27/1965    Quit date: 12/27/1980    Years since quitting: 43.1   Smokeless tobacco: Never  Vaping Use   Vaping status: Never Used  Substance and Sexual Activity   Alcohol use: Yes    Alcohol/week: 1.0 standard drink of alcohol    Types: 1 Glasses of wine per week   Drug use: No   Sexual activity: Not Currently  Other Topics Concern   Not on file  Social History Narrative   Lives at home alone. Husband passed away 03/10/13.   Right-handed.   No daily use of caffeine.   Daughter lives in [place] .   Social Drivers of Corporate investment banker Strain: Low Risk  (  05/25/2022)   Overall Financial Resource Strain (CARDIA)    Difficulty of Paying Living Expenses: Not hard at all  Food Insecurity: No Food Insecurity (12/02/2023)   Hunger Vital Sign    Worried About Running Out of Food in the Last Year: Never true    Ran Out of Food in the Last Year: Never true  Transportation Needs: No Transportation Needs (12/02/2023)   PRAPARE - Administrator, Civil Service (Medical): No    Lack of Transportation (Non-Medical): No  Physical Activity: Inactive (05/25/2022)   Exercise Vital Sign    Days of Exercise per Week: 0 days    Minutes of Exercise per Session: 0 min  Stress: No Stress Concern Present (05/25/2022)   [employer] of Occupational Health -  Occupational Stress Questionnaire    Feeling of Stress : Not at all  Social Connections: Moderately Integrated (12/02/2023)   Social Connection and Isolation Panel    Frequency of Communication with Friends and Family: More than three times a week    Frequency of Social Gatherings with Friends and Family: More than three times a week    Attends Religious Services: More than 4 times per year    Active Member of [employer] or Organizations: Yes    Attends Banker Meetings: More than 4 times per year    Marital Status: Widowed  Intimate Partner Violence: Not At Risk (12/02/2023)   Humiliation, Afraid, Rape, and Kick questionnaire    Fear of Current or Ex-Partner: No    Emotionally Abused: No    Physically Abused: No    Sexually Abused: No    FAMILY HISTORY: Family History  Problem Relation Age of Onset   Esophageal cancer Father    Dementia Mother     ALLERGIES:  is allergic to ibuprofen  and oxycodone .  MEDICATIONS:  Current Outpatient Medications  Medication Sig Dispense Refill   acetaminophen  (TYLENOL ) 325 MG tablet Take 2 tablets (650 mg total) by mouth every 6 (six) hours as needed for mild pain (pain score 1-3), fever or headache (or Fever >/= 101).     albuterol  (VENTOLIN  HFA) 108 (90 Base) MCG/ACT inhaler Inhale 2 puffs into the lungs every 6 (six) hours as needed for wheezing or shortness of breath. (Patient not taking: Reported on 01/05/2024)     amLODipine  (NORVASC ) 5 MG tablet Take 1 tablet (5 mg total) by mouth daily.     apixaban  (ELIQUIS ) 5 MG TABS tablet Take 1 tablet (5 mg total) by mouth 2 (two) times daily. 180 tablet 0   Ascorbic Acid  (VITAMIN C  PO) Take 1 tablet by mouth daily. (Patient not taking: Reported on 01/05/2024)     BREZTRI  AEROSPHERE 160-9-4.8 MCG/ACT AERO USE 2 INHALATIONS BY MOUTH INTO  THE LUNGS IN THE MORNING AND AT  BEDTIME 32.1 g 3   dorzolamide  (TRUSOPT ) 2 % ophthalmic solution Place 1 drop into the left eye 2 (two) times daily.      doxazosin  (CARDURA ) 4 MG tablet TAKE ONE-HALF TABLET BY MOUTH  DAILY 50 tablet 2   feeding supplement (ENSURE SURGERY) LIQD Take 237 mLs by mouth 2 (two) times daily between meals. (Patient not taking: Reported on 01/05/2024)     fish oil-omega-3 fatty acids  1000 MG capsule Take 1,000 mg by mouth daily.     lisinopril  (ZESTRIL ) 40 MG tablet TAKE 1 TABLET BY MOUTH DAILY 100 tablet 2   Multiple Vitamin (MULTIVITAMIN) tablet Take 1 tablet by mouth daily.     pantoprazole  (PROTONIX )  40 MG tablet Take 1 tablet (40 mg total) by mouth daily. 30 tablet 0   pravastatin  (PRAVACHOL ) 40 MG tablet Take 1 tablet (40 mg total) by mouth daily. 09/29/23-PT NEEDS APPOINTMENT 30 tablet 0   solifenacin  (VESICARE ) 10 MG tablet Take 1 tablet (10 mg total) by mouth daily. 90 tablet 3   thiamine  (VITAMIN B-1) 100 MG tablet Take 1 tablet (100 mg total) by mouth daily.     traZODone  (DESYREL ) 50 MG tablet TAKE 1 TABLET BY MOUTH AT  BEDTIME 90 tablet 3   No current facility-administered medications for this visit.   Facility-Administered Medications Ordered in Other Visits  Medication Dose Route Frequency Provider Last Rate Last Admin   iron  sucrose (VENOFER ) injection 200 mg  200 mg Intravenous Once Mehar Kirkwood R, MD        PHYSICAL EXAMINATION:   Vitals:   02/23/24 1028  BP: (!) 112/54  Pulse: 63  Resp: 20  Temp: (!) 97.1 F (36.2 C)  SpO2: 96%   Filed Weights   02/23/24 1028  Weight: 205 lb 14.4 oz (93.4 kg)    Physical Exam Vitals and nursing note reviewed.  HENT:     Head: Normocephalic and atraumatic.     Mouth/Throat:     Pharynx: Oropharynx is clear.  Eyes:     Extraocular Movements: Extraocular movements intact.     Pupils: Pupils are equal, round, and reactive to light.  Cardiovascular:     Rate and Rhythm: Normal rate and regular rhythm.  Pulmonary:     Comments: Decreased breath sounds bilaterally.  Abdominal:     Palpations: Abdomen is soft.  Musculoskeletal:         General: Normal range of motion.     Cervical back: Normal range of motion.  Skin:    General: Skin is warm.  Neurological:     General: No focal deficit present.     Mental Status: She is alert and oriented to person, place, and time.  Psychiatric:        Behavior: Behavior normal.        Judgment: Judgment normal.     LABORATORY DATA:  I have reviewed the data as listed Lab Results  Component Value Date   WBC 6.7 02/23/2024   HGB 12.9 02/23/2024   HCT 39.6 02/23/2024   MCV 87.2 02/23/2024   PLT 196 02/23/2024   Recent Labs    12/08/23 0411 12/09/23 0500 12/13/23 1044 12/22/23 1037 02/23/24 1005  NA 140   < > 139 139 137  K 3.7   < > 3.7 3.4* 3.5  CL 111   < > 109 108 106  CO2 22   < > 23 23 22   GLUCOSE 117*   < > 137* 109* 104*  BUN 22   < > 9 16 15   CREATININE 1.43*   < > 0.89 0.92 1.13*  CALCIUM  8.5*   < > 8.9 8.5* 9.3  GFRNONAA 37*   < > >60 >60 49*  PROT 5.7*  --   --  6.2* 6.9  ALBUMIN  2.9*  --   --  3.1* 3.8  AST 18  --   --  19 19  ALT 18  --   --  20 17  ALKPHOS 50  --   --  50 59  BILITOT 0.6  --   --  0.5 0.7   < > = values in this interval not displayed.    RADIOGRAPHIC STUDIES: I have  personally reviewed the radiological images as listed and agreed with the findings in the report. No results found.    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  Currently s/p PRBC transfusion.  # MAY 2025-  hemoglobin around 8.4 .  S/p ron infusions- today Hb 12.9. Recommend gentle iron  [iron  biglycinate; 28 mg ] 1 pill a day.  This pill is unlikely to cause stomach upset or cause constipation.    # Etiology -iron  deficiency suspect from chronic GI blood loss/AVMs [Dr.Wohl]- defer to GI re: timing of endoscopies.    # Multiple comorbidities COPD/CHF-stable.    # Chronic back pain- stable.   # DISPOSITION: # Venofer  today # follow up in 3 months- MD: labs- cbc/cmp; iron  studies;  ferritin; possible venofer - Dr.B  Cc; Dr.Pickard    Above plan of care was discussed with patient/family in detail.  My contact information was given to the patient/family.     Cindy JONELLE Joe, MD 02/23/2024 10:50 AM

## 2024-02-23 NOTE — Telephone Encounter (Signed)
 Lvm for [name] to return call

## 2024-02-23 NOTE — Patient Instructions (Signed)
#  Recommend gentle iron [iron biglycinate; 28 mg ] 1 pill a day.  This pill is unlikely to cause stomach upset or cause constipation. Available Over the counter or talk to pharmacist.

## 2024-02-23 NOTE — Patient Instructions (Signed)

## 2024-02-23 NOTE — Assessment & Plan Note (Addendum)
#   Severe anemia hemoglobin 5 [[facility]-APRIL 2025] secondary iron  deficiency from to GI blood loss/AVMs.  Currently s/p PRBC transfusion.  # MAY 2025-  hemoglobin around 8.4 .  S/p ron infusions- today Hb 12.9. Recommend gentle iron  [iron  biglycinate; 28 mg ] 1 pill a day.  This pill is unlikely to cause stomach upset or cause constipation.    # Etiology -iron  deficiency suspect from chronic GI blood loss/AVMs [Dr.Wohl]- defer to GI re: timing of endoscopies.    # Multiple comorbidities COPD/CHF-stable.    # Chronic back pain- stable.   # DISPOSITION: # Venofer  today # follow up in 3 months- MD: labs- cbc/cmp; iron  studies; ferritin; possible venofer - Dr.B  Cc; Dr.Pickard

## 2024-02-27 ENCOUNTER — Emergency Department (HOSPITAL_COMMUNITY)

## 2024-02-27 ENCOUNTER — Other Ambulatory Visit: Payer: Self-pay

## 2024-02-27 ENCOUNTER — Inpatient Hospital Stay (HOSPITAL_COMMUNITY)
Admission: EM | Admit: 2024-02-27 | Discharge: 2024-03-08 | DRG: 683 | Disposition: A | Discharge status: Skilled Nursing Facility | Attending: Internal Medicine | Admitting: Internal Medicine

## 2024-02-27 ENCOUNTER — Encounter (HOSPITAL_COMMUNITY): Payer: Self-pay

## 2024-02-27 ENCOUNTER — Observation Stay (HOSPITAL_COMMUNITY)

## 2024-02-27 DIAGNOSIS — E860 Dehydration: Secondary | ICD-10-CM | POA: Diagnosis not present

## 2024-02-27 DIAGNOSIS — E876 Hypokalemia: Secondary | ICD-10-CM | POA: Diagnosis present

## 2024-02-27 DIAGNOSIS — Z7901 Long term (current) use of anticoagulants: Secondary | ICD-10-CM | POA: Diagnosis not present

## 2024-02-27 DIAGNOSIS — N281 Cyst of kidney, acquired: Secondary | ICD-10-CM | POA: Diagnosis not present

## 2024-02-27 DIAGNOSIS — W19XXXA Unspecified fall, initial encounter: Secondary | ICD-10-CM

## 2024-02-27 DIAGNOSIS — Z87442 Personal history of urinary calculi: Secondary | ICD-10-CM

## 2024-02-27 DIAGNOSIS — K529 Noninfective gastroenteritis and colitis, unspecified: Secondary | ICD-10-CM | POA: Diagnosis not present

## 2024-02-27 DIAGNOSIS — I69998 Other sequelae following unspecified cerebrovascular disease: Secondary | ICD-10-CM | POA: Diagnosis not present

## 2024-02-27 DIAGNOSIS — Z885 Allergy status to narcotic agent status: Secondary | ICD-10-CM

## 2024-02-27 DIAGNOSIS — Z800 Family history of malignant neoplasm of digestive organs: Secondary | ICD-10-CM | POA: Diagnosis not present

## 2024-02-27 DIAGNOSIS — N3000 Acute cystitis without hematuria: Secondary | ICD-10-CM

## 2024-02-27 DIAGNOSIS — N1831 Chronic kidney disease, stage 3a: Secondary | ICD-10-CM | POA: Diagnosis present

## 2024-02-27 DIAGNOSIS — F0390 Unspecified dementia without behavioral disturbance: Secondary | ICD-10-CM | POA: Diagnosis present

## 2024-02-27 DIAGNOSIS — Z87891 Personal history of nicotine dependence: Secondary | ICD-10-CM | POA: Diagnosis not present

## 2024-02-27 DIAGNOSIS — Z993 Dependence on wheelchair: Secondary | ICD-10-CM

## 2024-02-27 DIAGNOSIS — K449 Diaphragmatic hernia without obstruction or gangrene: Secondary | ICD-10-CM | POA: Diagnosis not present

## 2024-02-27 DIAGNOSIS — N189 Chronic kidney disease, unspecified: Secondary | ICD-10-CM | POA: Diagnosis not present

## 2024-02-27 DIAGNOSIS — R55 Syncope and collapse: Secondary | ICD-10-CM | POA: Diagnosis present

## 2024-02-27 DIAGNOSIS — R1111 Vomiting without nausea: Secondary | ICD-10-CM | POA: Diagnosis not present

## 2024-02-27 DIAGNOSIS — Z6831 Body mass index (BMI) 31.0-31.9, adult: Secondary | ICD-10-CM

## 2024-02-27 DIAGNOSIS — I10 Essential (primary) hypertension: Secondary | ICD-10-CM | POA: Diagnosis present

## 2024-02-27 DIAGNOSIS — N179 Acute kidney failure, unspecified: Principal | ICD-10-CM | POA: Diagnosis present

## 2024-02-27 DIAGNOSIS — R197 Diarrhea, unspecified: Secondary | ICD-10-CM | POA: Diagnosis present

## 2024-02-27 DIAGNOSIS — H5461 Unqualified visual loss, right eye, normal vision left eye: Secondary | ICD-10-CM | POA: Diagnosis present

## 2024-02-27 DIAGNOSIS — J449 Chronic obstructive pulmonary disease, unspecified: Secondary | ICD-10-CM | POA: Diagnosis present

## 2024-02-27 DIAGNOSIS — I5032 Chronic diastolic (congestive) heart failure: Secondary | ICD-10-CM | POA: Diagnosis not present

## 2024-02-27 DIAGNOSIS — K2100 Gastro-esophageal reflux disease with esophagitis, without bleeding: Secondary | ICD-10-CM | POA: Diagnosis not present

## 2024-02-27 DIAGNOSIS — D631 Anemia in chronic kidney disease: Secondary | ICD-10-CM | POA: Diagnosis not present

## 2024-02-27 DIAGNOSIS — I130 Hypertensive heart and chronic kidney disease with heart failure and stage 1 through stage 4 chronic kidney disease, or unspecified chronic kidney disease: Secondary | ICD-10-CM | POA: Diagnosis not present

## 2024-02-27 DIAGNOSIS — K219 Gastro-esophageal reflux disease without esophagitis: Secondary | ICD-10-CM | POA: Diagnosis not present

## 2024-02-27 DIAGNOSIS — E669 Obesity, unspecified: Secondary | ICD-10-CM | POA: Diagnosis present

## 2024-02-27 DIAGNOSIS — F1090 Alcohol use, unspecified, uncomplicated: Secondary | ICD-10-CM | POA: Diagnosis not present

## 2024-02-27 DIAGNOSIS — Y92009 Unspecified place in unspecified non-institutional (private) residence as the place of occurrence of the external cause: Secondary | ICD-10-CM

## 2024-02-27 DIAGNOSIS — Z86711 Personal history of pulmonary embolism: Secondary | ICD-10-CM | POA: Diagnosis not present

## 2024-02-27 DIAGNOSIS — B9620 Unspecified Escherichia coli [E. coli] as the cause of diseases classified elsewhere: Secondary | ICD-10-CM | POA: Diagnosis present

## 2024-02-27 DIAGNOSIS — N390 Urinary tract infection, site not specified: Secondary | ICD-10-CM | POA: Diagnosis present

## 2024-02-27 DIAGNOSIS — R0689 Other abnormalities of breathing: Secondary | ICD-10-CM | POA: Diagnosis not present

## 2024-02-27 DIAGNOSIS — F1010 Alcohol abuse, uncomplicated: Secondary | ICD-10-CM | POA: Diagnosis present

## 2024-02-27 DIAGNOSIS — G3184 Mild cognitive impairment, so stated: Secondary | ICD-10-CM | POA: Diagnosis present

## 2024-02-27 DIAGNOSIS — R231 Pallor: Secondary | ICD-10-CM | POA: Diagnosis not present

## 2024-02-27 DIAGNOSIS — E785 Hyperlipidemia, unspecified: Secondary | ICD-10-CM | POA: Diagnosis present

## 2024-02-27 LAB — CBC WITH DIFFERENTIAL/PLATELET
Abs Immature Granulocytes: 0.02 K/uL (ref 0.00–0.07)
Basophils Absolute: 0 K/uL (ref 0.0–0.1)
Basophils Relative: 0 %
Eosinophils Absolute: 0 K/uL (ref 0.0–0.5)
Eosinophils Relative: 0 %
HCT: 44.3 % (ref 36.0–46.0)
Hemoglobin: 14.5 g/dL (ref 12.0–15.0)
Immature Granulocytes: 0 %
Lymphocytes Relative: 10 %
Lymphs Abs: 0.9 K/uL (ref 0.7–4.0)
MCH: 28.8 pg (ref 26.0–34.0)
MCHC: 32.7 g/dL (ref 30.0–36.0)
MCV: 88.1 fL (ref 80.0–100.0)
Monocytes Absolute: 0.7 K/uL (ref 0.1–1.0)
Monocytes Relative: 7 %
Neutro Abs: 7.3 K/uL (ref 1.7–7.7)
Neutrophils Relative %: 83 %
Platelets: 204 K/uL (ref 150–400)
RBC: 5.03 MIL/uL (ref 3.87–5.11)
RDW: 16.7 % — ABNORMAL HIGH (ref 11.5–15.5)
WBC: 8.9 K/uL (ref 4.0–10.5)
nRBC: 0 % (ref 0.0–0.2)

## 2024-02-27 LAB — COMPREHENSIVE METABOLIC PANEL WITH GFR
ALT: 21 U/L (ref 0–44)
AST: 23 U/L (ref 15–41)
Albumin: 3.8 g/dL (ref 3.5–5.0)
Alkaline Phosphatase: 58 U/L (ref 38–126)
Anion gap: 12 (ref 5–15)
BUN: 25 mg/dL — ABNORMAL HIGH (ref 8–23)
CO2: 22 mmol/L (ref 22–32)
Calcium: 10.2 mg/dL (ref 8.9–10.3)
Chloride: 107 mmol/L (ref 98–111)
Creatinine, Ser: 2.16 mg/dL — ABNORMAL HIGH (ref 0.44–1.00)
GFR, Estimated: 22 mL/min — ABNORMAL LOW (ref >60)
Glucose, Bld: 120 mg/dL — ABNORMAL HIGH (ref 70–99)
Potassium: 3.9 mmol/L (ref 3.5–5.1)
Sodium: 141 mmol/L (ref 135–145)
Total Bilirubin: 0.9 mg/dL (ref 0.0–1.2)
Total Protein: 7.4 g/dL (ref 6.5–8.1)

## 2024-02-27 LAB — TYPE AND SCREEN
ABO/RH(D): A POS
Antibody Screen: NEGATIVE

## 2024-02-27 LAB — URINALYSIS, ROUTINE W REFLEX MICROSCOPIC
Bilirubin Urine: NEGATIVE
Glucose, UA: NEGATIVE mg/dL
Hgb urine dipstick: NEGATIVE
Ketones, ur: NEGATIVE mg/dL
Nitrite: NEGATIVE
Protein, ur: 100 mg/dL — AB
Specific Gravity, Urine: 1.02 (ref 1.005–1.030)
WBC, UA: >50 WBC/hpf (ref 0–5)
pH: 5 (ref 5.0–8.0)

## 2024-02-27 LAB — POC OCCULT BLOOD, ED: Fecal Occult Bld: NEGATIVE

## 2024-02-27 LAB — ETHANOL: Alcohol, Ethyl (B): <15 mg/dL (ref <15)

## 2024-02-27 MED ORDER — APIXABAN 5 MG PO TABS
5.0000 mg | ORAL_TABLET | Freq: Two times a day (BID) | ORAL | Status: DC
  Administered 2024-02-28 – 2024-03-08 (×18): 5 mg via ORAL
  Filled 2024-02-27 (×19): qty 1

## 2024-02-27 MED ORDER — SODIUM CHLORIDE 0.9% FLUSH
3.0000 mL | Freq: Two times a day (BID) | INTRAVENOUS | Status: DC
  Administered 2024-02-27 – 2024-03-08 (×19): 3 mL via INTRAVENOUS

## 2024-02-27 MED ORDER — ACETAMINOPHEN 650 MG RE SUPP: 650.0000 mg | Freq: Four times a day (QID) | RECTAL | Status: DC | PRN

## 2024-02-27 MED ORDER — TRAZODONE HCL 50 MG PO TABS
50.0000 mg | ORAL_TABLET | Freq: Every day | ORAL | Status: DC
  Administered 2024-02-28 – 2024-03-07 (×9): 50 mg via ORAL
  Filled 2024-02-27 (×11): qty 1

## 2024-02-27 MED ORDER — ALBUTEROL SULFATE (2.5 MG/3ML) 0.083% IN NEBU: 2.5000 mg | INHALATION_SOLUTION | Freq: Four times a day (QID) | RESPIRATORY_TRACT | Status: DC | PRN

## 2024-02-27 MED ORDER — ENOXAPARIN SODIUM 30 MG/0.3ML IJ SOSY: 30.0000 mg | PREFILLED_SYRINGE | INTRAMUSCULAR | Status: DC

## 2024-02-27 MED ORDER — THIAMINE MONONITRATE 100 MG PO TABS
100.0000 mg | ORAL_TABLET | Freq: Every day | ORAL | Status: DC
  Administered 2024-02-28 – 2024-03-08 (×10): 100 mg via ORAL
  Filled 2024-02-27 (×10): qty 1

## 2024-02-27 MED ORDER — FESOTERODINE FUMARATE ER 4 MG PO TB24
4.0000 mg | ORAL_TABLET | Freq: Every day | ORAL | Status: DC
  Administered 2024-02-28 – 2024-03-08 (×10): 4 mg via ORAL
  Filled 2024-02-27 (×11): qty 1

## 2024-02-27 MED ORDER — BUDESON-GLYCOPYRROL-FORMOTEROL 160-9-4.8 MCG/ACT IN AERO
2.0000 | INHALATION_SPRAY | Freq: Two times a day (BID) | RESPIRATORY_TRACT | Status: DC
  Administered 2024-02-29 – 2024-03-08 (×14): 2 via RESPIRATORY_TRACT
  Filled 2024-02-27 (×3): qty 5.9

## 2024-02-27 MED ORDER — SODIUM CHLORIDE 0.9 % IV BOLUS
1000.0000 mL | Freq: Once | INTRAVENOUS | Status: AC
  Administered 2024-02-27: 1000 mL via INTRAVENOUS

## 2024-02-27 MED ORDER — DOXAZOSIN MESYLATE 2 MG PO TABS
2.0000 mg | ORAL_TABLET | Freq: Every day | ORAL | Status: DC
  Administered 2024-02-28 – 2024-03-08 (×10): 2 mg via ORAL
  Filled 2024-02-27 (×11): qty 1

## 2024-02-27 MED ORDER — DORZOLAMIDE HCL 2 % OP SOLN
1.0000 [drp] | Freq: Two times a day (BID) | OPHTHALMIC | Status: DC
  Administered 2024-02-29 – 2024-03-08 (×18): 1 [drp] via OPHTHALMIC
  Filled 2024-02-27 (×2): qty 10

## 2024-02-27 MED ORDER — SODIUM CHLORIDE 0.9 % IV SOLN: INTRAVENOUS | Status: DC

## 2024-02-27 MED ORDER — PANTOPRAZOLE SODIUM 40 MG PO TBEC
40.0000 mg | DELAYED_RELEASE_TABLET | Freq: Every day | ORAL | Status: DC
  Administered 2024-02-28 – 2024-03-08 (×10): 40 mg via ORAL
  Filled 2024-02-27 (×10): qty 1

## 2024-02-27 MED ORDER — ACETAMINOPHEN 325 MG PO TABS
650.0000 mg | ORAL_TABLET | Freq: Four times a day (QID) | ORAL | Status: DC | PRN
  Administered 2024-02-29 – 2024-03-05 (×3): 650 mg via ORAL
  Filled 2024-02-27 (×3): qty 2

## 2024-02-27 MED ORDER — SODIUM CHLORIDE 0.9 % IV SOLN
1.0000 g | INTRAVENOUS | Status: DC
  Administered 2024-02-27 – 2024-03-01 (×4): 1 g via INTRAVENOUS
  Filled 2024-02-27 (×4): qty 10

## 2024-02-27 NOTE — ED Notes (Signed)
Pt to US with transport

## 2024-02-27 NOTE — ED Notes (Signed)
 CCMD called.

## 2024-02-27 NOTE — H&P (Addendum)
 History and Physical    [name] FMW:[id number] DOB: 10-22-1940 DOA: 02/27/2024 DOS: the patient was seen and examined on 02/27/2024 PCP: Duanne Butler DASEN, MD  Patient coming from: Home via EMS  Chief Complaint:  Chief Complaint  Patient presents with   Loss of Consciousness    Patient arrived from home via [facility]. Reports syncopal episode at home while walking. Pt did not hit head. Oriented to self, time, place but not sure of situation   HPI: Virginia Walters is a 83 y.o. 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  History obtained from the patient is not totally clear if accurate.  She experienced a fall without losing consciousness after being unable to get out of bed. She lives alone and typically uses a cane and a wheelchair for mobility.  She has been experiencing diarrhea for approximately three to four years, which began around the time her husband passed away ten years ago. She has not been eating much and has been drinking fluids adequately. She was on antibiotics recently, but the specific reason for the antibiotics is unclear.  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  No pain or recent stomach pains despite the diarrhea.  After getting in contact with the patient's cousin and caregiver Ms. Virginia Walters, additional history.  She is normally the one that comes and gives the patient meals.  She had just recently been hospitalized 4/18 -5/1 at [facility] after present with acute metabolic encephalopathy and syncope thought secondary to dehydration.  During hospitalization patient was noted to develop acute respiratory failure with hypoxia found to be secondary  to pulmonary embolism started on Eliquis .  Prior to that hospitalization she was hospitalized for GI bleed requiring 3 units of packed red blood cells.  Her altered mental status was thought to be secondary to UTI, alcohol use and/or dementia.  Apparently patient previously drank significant amount of wine when she used to drive, but since that hospitalization they had taken away her keys and not allowed any alcohol in the house.  Yesterday, when the cleaner came to the house it was in its normal state.  Today house was noted to be in total disarray with diarrhea present from the patient's bed all throughout the house.  She had been constipated earlier last week and had been given some prune juice.  Family notes she has been dealing with intermittent episodes of diarrhea for quite some time.   Upon admission into the emergency department patient was noted to be afebrile with blood pressures noted to be as low as 107/31.  Labs significant for BUN 25, creatinine 2.16 (baseline previously noted to be around 1.13), alcohol level undetectable.  Chest x-ray showed no acute disease and likely chronic interstitial changes.  Patient had been given a bolus of 1 L normal saline IV fluids.  Review of Systems: As mentioned in the history of present illness. All other systems reviewed and are negative. Past Medical History:  Diagnosis Date   (HFpEF) heart failure with preserved ejection fraction (HCC)    a.) TTE 07/22/2018: EF 45-50%; mild LVH; mild diffuse HK; mild LA dilation; PASP 30-35 mmHg; G1DD.   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   Weakness    Past Surgical History:  Procedure Laterality Date   ANKLE ARTHROSCOPY WITH OPEN REDUCTION INTERNAL FIXATION (ORIF) Right    APPENDECTOMY     CHOLECYSTECTOMY     COLONOSCOPY N/A 07/21/2018   Procedure: COLONOSCOPY;  Surgeon: Unk Corinn Skiff, MD;  Location: [facility] ENDOSCOPY;  Service: Gastroenterology;  Laterality: N/A;   COLONOSCOPY N/A 11/20/2023   Procedure: COLONOSCOPY;  Surgeon: Jinny Carmine, MD;  Location: [facility] ENDOSCOPY;  Service: Endoscopy;  Laterality: N/A;   COLONOSCOPY N/A 11/27/2023   Procedure: COLONOSCOPY;  Surgeon: Jinny Carmine, MD;  Location: [facility] ENDOSCOPY;  Service: Endoscopy;  Laterality: N/A;   COLONOSCOPY N/A 12/06/2023   Procedure: COLONOSCOPY;  Surgeon: Jinny Carmine, MD;  Location: [facility] ENDOSCOPY;  Service: Endoscopy;  Laterality: N/A;   COLONOSCOPY WITH PROPOFOL  N/A 04/01/2020   Procedure: COLONOSCOPY WITH PROPOFOL ;  Surgeon: Janalyn Keene NOVAK, MD;  Location: [facility] ENDOSCOPY;  Service: Endoscopy;  Laterality: N/A;   ESOPHAGOGASTRODUODENOSCOPY N/A 07/21/2018   Procedure: ESOPHAGOGASTRODUODENOSCOPY (EGD);  Surgeon: Unk Corinn Skiff, MD;  Location: [facility] ENDOSCOPY;  Service: Gastroenterology;  Laterality: N/A;   ESOPHAGOGASTRODUODENOSCOPY N/A 02/11/2020   Procedure: ESOPHAGOGASTRODUODENOSCOPY (EGD);  Surgeon: Janalyn Keene NOVAK, MD;  Location: [facility] ENDOSCOPY;  Service: Endoscopy;  Laterality: N/A;    ESOPHAGOGASTRODUODENOSCOPY N/A 11/20/2023   Procedure: EGD (ESOPHAGOGASTRODUODENOSCOPY);  Surgeon: Jinny Carmine, MD;  Location: [facility] ENDOSCOPY;  Service: Endoscopy;  Laterality: N/A;   ESOPHAGOGASTRODUODENOSCOPY N/A 11/27/2023   Procedure: EGD (ESOPHAGOGASTRODUODENOSCOPY);  Surgeon: Jinny Carmine, MD;  Location: [facility] ENDOSCOPY;  Service: Endoscopy;  Laterality: N/A;   ESOPHAGOGASTRODUODENOSCOPY N/A 12/06/2023   Procedure: EGD (ESOPHAGOGASTRODUODENOSCOPY);  Surgeon: Jinny Carmine, MD;  Location: [facility] ENDOSCOPY;  Service: Endoscopy;  Laterality: N/A;   ESOPHAGOGASTRODUODENOSCOPY (EGD) WITH PROPOFOL  N/A 04/01/2020   Procedure: ESOPHAGOGASTRODUODENOSCOPY (EGD) WITH PROPOFOL ;  Surgeon: Janalyn Keene NOVAK, MD;  Location: [facility] ENDOSCOPY;  Service: Endoscopy;  Laterality: N/A;   FRACTURE SURGERY Left    leg fx   GIVENS CAPSULE STUDY  12/06/2023   Procedure: IMAGING PROCEDURE, GI TRACT, INTRALUMINAL, VIA CAPSULE;  Surgeon: Jinny Carmine, MD;  Location: [facility] ENDOSCOPY;  Service: Endoscopy;;   IR EXCHANGE BILIARY DRAIN  10/22/2021   IR PERC CHOLECYSTOSTOMY  09/17/2021   PULMONARY THROMBECTOMY N/A 12/11/2023   Procedure: PULMONARY THROMBECTOMY;  Surgeon: Jama Cordella MATSU, MD;  Location: [facility] INVASIVE CV LAB;  Service: Cardiovascular;  Laterality: N/A;   TUBAL LIGATION     Social History:  reports that she  quit smoking about 43 years ago. Her smoking use included cigarettes. She started smoking about 58 years ago. She has a 15 pack-year smoking history. She has never used smokeless tobacco. She reports current alcohol use of about 1.0 standard drink of alcohol per week. She reports that she does not use drugs.  Allergies  Allergen Reactions   Ibuprofen     Oxycodone  Itching    Family History  Problem Relation Age of Onset   Esophageal cancer Father    Dementia Mother     Prior to Admission medications   Medication Sig Start Date End Date Taking? Authorizing Provider  acetaminophen  (TYLENOL ) 325 MG  tablet Take 2 tablets (650 mg total) by mouth every 6 (six) hours as needed for mild pain (pain score 1-3), fever or headache (or Fever >/= 101). 12/14/23   Fausto Burnard LABOR, DO  albuterol  (VENTOLIN  HFA) 108 (90 Base) MCG/ACT inhaler Inhale 2 puffs into the lungs every 6 (six) hours as needed for wheezing or shortness of breath. Patient not taking: Reported on 01/05/2024 12/14/23   Fausto Burnard A, DO  amLODipine  (NORVASC ) 5 MG tablet Take 1 tablet (5 mg total) by mouth daily. 02/23/24   Duanne Butler DASEN, MD  apixaban  (ELIQUIS ) 5 MG TABS tablet Take 1 tablet (5 mg total) by mouth 2 (two) times daily. 01/12/24   Duanne Butler DASEN, MD  Ascorbic Acid  (VITAMIN C  PO) Take 1 tablet by mouth daily. Patient not taking: Reported on 01/05/2024    [provider]  BREZTRI  AEROSPHERE 160-9-4.8 MCG/ACT AERO USE 2 INHALATIONS BY MOUTH INTO  THE LUNGS IN THE MORNING AND AT  BEDTIME 12/07/21   Duanne Butler DASEN, MD  dorzolamide  (TRUSOPT ) 2 % ophthalmic solution Place 1 drop into the left eye 2 (two) times daily.    [provider]  doxazosin  (CARDURA ) 4 MG tablet TAKE ONE-HALF TABLET BY MOUTH  DAILY 10/04/23   Duanne Butler DASEN, MD  feeding supplement (ENSURE SURGERY) LIQD Take 237 mLs by mouth 2 (two) times daily between meals. Patient not taking: Reported on 01/05/2024 09/21/21   Briana Elgin LABOR, MD  fish oil-omega-3 fatty acids  1000 MG capsule Take 1,000 mg by mouth daily.    [provider]  lisinopril  (ZESTRIL ) 40 MG tablet TAKE 1 TABLET BY MOUTH DAILY 10/04/23   Duanne Butler DASEN, MD  Multiple Vitamin (MULTIVITAMIN) tablet Take 1 tablet by mouth daily.    [provider]  pantoprazole  (PROTONIX ) 40 MG tablet Take 1 tablet (40 mg total) by mouth daily. 11/17/23 12/17/23  Trudy Anthony HERO, MD  pravastatin  (PRAVACHOL ) 40 MG tablet Take 1 tablet (40 mg total) by mouth daily. 09/29/23-PT NEEDS APPOINTMENT 09/29/23   Duanne Butler DASEN, MD  solifenacin  (VESICARE ) 10 MG tablet Take 1 tablet (10  mg total) by mouth daily. 11/13/23   Duanne Butler DASEN, MD  thiamine  (VITAMIN B-1) 100 MG tablet Take 1 tablet (100 mg total) by mouth daily. 12/14/23   Fausto Burnard LABOR, DO  traZODone  (DESYREL ) 50 MG tablet TAKE 1 TABLET BY MOUTH AT  BEDTIME 10/20/23   Duanne Butler DASEN, MD    Physical Exam: Vitals:   02/27/24 1318  BP: (!) 107/31  Pulse: 85  Temp: 97.6 F (36.4 C)  TempSrc: Oral  SpO2: 98%   Constitutional: Elderly female currently in no acute distress Eyes: Disconjugate gaze at baseline in the right eye, but able to move in all fields.  Patient reports lack of vision out of right eye ENMT: Mucous membranes  are moist.  Fair dentition.  Neck: normal, supple  Respiratory: clear to auscultation bilaterally, no wheezing, no crackles. No accessory muscle use.  Cardiovascular: Regular rate and rhythm. No extremity edema.  Abdomen: no tenderness, no masses palpated.  Bowel sounds positive.  Musculoskeletal: no clubbing / cyanosis. No joint deformity upper and lower extremities. Good ROM, no contractures. Normal muscle tone.  Skin: no rashes, lesions, ulcers. No induration Neurologic: CN 2-12 grossly intact.  Strength 5/5 in all 4.  Psychiatric: Poor recent memory. Alert and oriented x 3, but not clear on circumstances.  Data Reviewed:  Reviewed labs, imaging, and pertinent records as documented.  Assessment and Plan:  Acute kidney injury superimposed on chronic kidney disease stage IIIa Patient presents with BUN 25 and creatinine 2.16.  Baseline previously noted to be around 1.13 when checked on 7/11.  Acute kidney injury likely secondary to reports of diarrhea recently. - Admit to a medical telemetry bed - Follow-up urinalysis - Check renal ultrasound - Normal saline IV fluids at 75 mL/h - Recheck kidney function in a.m.  Urinary tract infection Present on admission.  Urinalysis positive for moderate leukocytes, many bacteria, and greater than 50 WBCs.  Previous cultures positive  for E. coli that was resistant to ampicillin and ampicillin/sulbactam. - Check urine culture - Rocephin  IV.  Adjust antibiotics as deemed medically appropriate  Essential hypertension Blood pressures were initially noted to be 107/31 and had been reported hypotensive with EMS.  Home blood pressure regimen includes amlodipine  and lisinopril .  Patient had been given 1 L normal saline IV fluids with improvement. - Goal maps greater than 65  - Hold home blood pressure regimen due to soft blood pressures.  Diarrhea Acute on chronic.  Patient reportedly had multiple episodes of diarrhea all throughout the house overnight.  Family notes that she has had diarrhea intermittently for quite some time now and had recently received antibiotics within the last 3 months. - Monitor intake and output - Check C. difficile - Consider need of further workup if symptoms persist  Fall/syncope Unclear if the patient fell out of bed or had a syncopal episode.  However was noted to have very low blood pressures initially that improved with IV fluids.   - Check orthostatic vital signs - Follow-up telemetry - PT to eval and treat  Mild cognitive impairment Patient appears alert and oriented to person and place, but does have issues with recent memory. 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 - Continue Eliquis   Diastolic CHF Chronic.  Patient appears to be euvolemic at this time.  Last echocardiogram noted EF to be 55 to 60% with grade 1 diastolic dysfunction when checked on 11/16/2023. - Daily weights  COPD Patient without any active wheezing at this time. - Continue Breztri  and albuterol  nebs as needed for shortness of breath/wheezing  Alcohol use disorder Patient previously drank multiple  glasses of wine per day on average, but has not had 1 in the house since May as she is no longer allowed to drive.  Alcohol level was undetectable.  GERD - Continue Protonix   Obesity BMI 32.25 kg/m  DVT prophylaxis: Eliquis  Advance Care Planning:   Code Status: Full Code   Consults:   Family Communication: Patient's son-in-law updated over the phone as well as cousin [name]  Severity of Illness: The  appropriate patient status for this patient is INPATIENT. Inpatient status is judged to be reasonable and necessary in order to provide the required intensity of service to ensure the patient's safety. The patient's presenting symptoms, physical exam findings, and initial radiographic and laboratory data in the context of their chronic comorbidities is felt to place them at high risk for further clinical deterioration. Furthermore, it is not anticipated that the patient will be medically stable for discharge from the hospital within 2 midnights of admission.   * I certify that at the point of admission it is my clinical judgment that the patient will require inpatient hospital care spanning beyond 2 midnights from the point of admission due to high intensity of service, high risk for further deterioration and high frequency of surveillance required.*  Author: Maximino DELENA Sharps, MD 02/27/2024 3:07 PM  For on call review [email or web address].

## 2024-02-27 NOTE — ED Notes (Signed)
 Called CCMD at 1400.

## 2024-02-27 NOTE — ED Provider Notes (Signed)
 [place] EMERGENCY DEPARTMENT AT [facility] Provider Note   CSN: [id number] Arrival date & time: 02/27/24  1308     Patient presents with: Loss of Consciousness (Patient arrived from home via [facility]. Reports syncopal episode at home while walking. Pt did not hit head. Oriented to self, time, place but not sure of situation)   Virginia Walters is a 83 y.o. female.   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  Pt just had an iron  transfusion on 7/11.  Pt lives alone and had a syncopal episode.  It is unclear who called EMS.  Pt can't describe syncopal event.  She denies sob.         Prior to Admission medications   Medication Sig Start Date End Date Taking? Authorizing Provider  acetaminophen  (TYLENOL ) 325 MG tablet Take 2 tablets (650 mg total) by mouth every 6 (six) hours as needed for mild pain (pain score 1-3), fever or headache (or Fever >/= 101). 12/14/23   Fausto Burnard LABOR, DO  albuterol  (VENTOLIN  HFA) 108 (90 Base) MCG/ACT inhaler Inhale 2 puffs into the lungs every 6 (six) hours as needed for wheezing or shortness of breath. Patient not taking: Reported on 01/05/2024 12/14/23   Fausto Burnard A, DO  amLODipine  (NORVASC ) 5 MG tablet Take 1 tablet (5 mg total) by mouth daily. 02/23/24   Duanne Butler DASEN, MD  apixaban  (ELIQUIS ) 5 MG TABS tablet Take 1 tablet (5 mg total) by mouth 2 (two) times daily. 01/12/24   Duanne Butler DASEN, MD  Ascorbic Acid  (VITAMIN C  PO) Take 1 tablet by mouth daily. Patient not taking: Reported on 01/05/2024    [provider]  BREZTRI  AEROSPHERE 160-9-4.8 MCG/ACT AERO USE 2 INHALATIONS BY MOUTH INTO  THE LUNGS IN THE MORNING AND AT  BEDTIME 12/07/21   Duanne Butler DASEN, MD  dorzolamide  (TRUSOPT ) 2 % ophthalmic solution Place 1 drop into the left eye 2 (two) times daily.     [provider]  doxazosin  (CARDURA ) 4 MG tablet TAKE ONE-HALF TABLET BY MOUTH  DAILY 10/04/23   Duanne Butler DASEN, MD  feeding supplement (ENSURE SURGERY) LIQD Take 237 mLs by mouth 2 (two) times daily between meals. Patient not taking: Reported on 01/05/2024 09/21/21   Briana Elgin LABOR, MD  fish oil-omega-3 fatty acids  1000 MG capsule Take 1,000 mg by mouth daily.    [provider]  lisinopril  (ZESTRIL ) 40 MG tablet TAKE 1 TABLET BY MOUTH DAILY 10/04/23   Duanne Butler DASEN, MD  Multiple Vitamin (MULTIVITAMIN) tablet Take 1 tablet by mouth daily.    [provider]  pantoprazole  (PROTONIX ) 40 MG tablet Take 1 tablet (40 mg total) by mouth daily. 11/17/23 12/17/23  Trudy Anthony HERO, MD  pravastatin  (PRAVACHOL ) 40 MG tablet Take 1 tablet (40 mg total) by mouth daily. 09/29/23-PT NEEDS APPOINTMENT 09/29/23   Duanne Butler DASEN, MD  solifenacin  (VESICARE ) 10 MG tablet Take 1 tablet (10 mg total) by mouth daily. 11/13/23   Duanne Butler DASEN, MD  thiamine  (VITAMIN B-1) 100 MG tablet Take 1 tablet (100 mg total) by mouth daily. 12/14/23   Fausto Burnard A, DO  traZODone  (DESYREL ) 50 MG tablet TAKE 1 TABLET BY MOUTH AT  BEDTIME 10/20/23   Duanne Butler DASEN, MD    Allergies: Ibuprofen  and Oxycodone     Review  of Systems  Neurological:  Positive for syncope.  All other systems reviewed and are negative.   Updated Vital Signs BP (!) 107/31 (BP Location: Left Arm)   Pulse 85   Temp 97.6 F (36.4 C) (Oral)   SpO2 98%   Physical Exam Vitals and nursing note reviewed.  Constitutional:      Appearance: Normal appearance.  HENT:     Head: Normocephalic and atraumatic.     Right Ear: External ear normal.     Left Ear: External ear normal.     Nose: Nose normal.     Mouth/Throat:     Mouth: Mucous membranes are moist.     Pharynx: Oropharynx is clear.  Eyes:     Extraocular Movements: Extraocular movements intact.     Conjunctiva/sclera: Conjunctivae normal.     Pupils: Pupils  are equal, round, and reactive to light.  Cardiovascular:     Rate and Rhythm: Normal rate.     Pulses: Normal pulses.     Heart sounds: Normal heart sounds.  Pulmonary:     Effort: Pulmonary effort is normal.     Breath sounds: Normal breath sounds.  Abdominal:     General: Abdomen is flat. Bowel sounds are normal.     Palpations: Abdomen is soft.  Genitourinary:    Rectum: Guaiac result negative.  Musculoskeletal:        General: Normal range of motion.     Cervical back: Normal range of motion and neck supple.  Skin:    General: Skin is warm.     Capillary Refill: Capillary refill takes less than 2 seconds.  Neurological:     General: No focal deficit present.     Mental Status: She is alert and oriented to person, place, and time.  Psychiatric:        Mood and Affect: Mood normal.        Behavior: Behavior normal.     (all labs ordered are listed, but only abnormal results are displayed) Labs Reviewed  CBC WITH DIFFERENTIAL/PLATELET - Abnormal; Notable for the following components:      Result Value   RDW 16.7 (*)    All other components within normal limits  COMPREHENSIVE METABOLIC PANEL WITH GFR - Abnormal; Notable for the following components:   Glucose, Bld 120 (*)    BUN 25 (*)    Creatinine, Ser 2.16 (*)    GFR, Estimated 22 (*)    All other components within normal limits  ETHANOL  URINALYSIS, ROUTINE W REFLEX MICROSCOPIC  CBG MONITORING, ED  POC OCCULT BLOOD, ED  TYPE AND SCREEN    EKG: EKG Interpretation Date/Time:  Tuesday February 27 2024 13:21:49 EDT Ventricular Rate:  84 PR Interval:    QRS Duration:  95 QT Interval:  399 QTC Calculation: 472 R Axis:   32  Text Interpretation: Normal sinus rhythm Borderline repolarization abnormality No significant change since last tracing Confirmed by Dean Clarity [phone]) on 02/27/2024 2:45:18 PM  Radiology: [facility] Chest Port 1 View Result Date: 02/27/2024 CLINICAL DATA:  Syncope EXAM: PORTABLE CHEST 1 VIEW  COMPARISON:  December 08, 2023 FINDINGS: Chronic appearing bilateral interstitial changes. No focal consolidation. No pleural effusions. No pneumothorax. Cardiomediastinal silhouette is unchanged. Hiatal hernia. No acute osseous findings. IMPRESSION: No active disease.  Likely chronic interstitial changes. Electronically Signed   By: Michaeline Blanch M.D.   On: 02/27/2024 14:27     Procedures   Medications Ordered in the ED  sodium chloride  0.9 %  bolus 1,000 mL (has no administration in time range)                                    Medical Decision Making Amount and/or Complexity of Data Reviewed Labs: ordered. Radiology: ordered.  Risk Decision regarding hospitalization.   This patient presents to the ED for concern of syncope, this involves an extensive number of treatment options, and is a complaint that carries with it a high risk of complications and morbidity.  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  The pertinent results include:  cbc nl, cmp with bun elevated at 25 and cr elevated at 2.16 (bun 15 and cr 1.13 on 7/11)   Imaging Studies ordered:  I ordered imaging studies including cxr  I independently visualized and interpreted imaging which showed No active disease.  Likely chronic interstitial changes.  I agree with the radiologist interpretation   Cardiac Monitoring:  The patient was maintained on a cardiac monitor.  I personally viewed and interpreted the cardiac monitored which showed an underlying  rhythm of: nsr   Medicines ordered and prescription drug management:  I ordered medication including ivfs  for sx  Reevaluation of the patient after these medicines showed that the patient improved I have reviewed the patients home medicines and have made adjustments as needed   Test Considered:  ct   Critical Interventions:  ivfs   Consultations Obtained:  I requested consultation with the hospitalist (Dr. Claudene),  and discussed lab and imaging findings as well as pertinent plan - he will admit   Problem List / ED Course:  Syncope with AKI:  likely due to dehydration.  It is unclear why she is not eating/drinking enough.  She lives alone and is probably not safe for home.  Pt given IVFs and will be admitted to monitor kidney function.   Reevaluation:  After the interventions noted above, I reevaluated the patient and found that they have :improved   Social Determinants of Health:  Lives at home   Dispostion:  After consideration of the diagnostic results and the patients response to treatment, I feel that the patent would benefit from admission.       Final diagnoses:  AKI (acute kidney injury) (HCC)  Syncope, unspecified syncope type    ED Discharge Orders     None          Dean Clarity, MD 02/27/24 1524

## 2024-02-28 ENCOUNTER — Telehealth: Payer: Self-pay

## 2024-02-28 ENCOUNTER — Other Ambulatory Visit: Payer: Self-pay

## 2024-02-28 DIAGNOSIS — Z800 Family history of malignant neoplasm of digestive organs: Secondary | ICD-10-CM | POA: Diagnosis not present

## 2024-02-28 DIAGNOSIS — N390 Urinary tract infection, site not specified: Secondary | ICD-10-CM | POA: Diagnosis present

## 2024-02-28 DIAGNOSIS — N189 Chronic kidney disease, unspecified: Secondary | ICD-10-CM | POA: Diagnosis not present

## 2024-02-28 DIAGNOSIS — M549 Dorsalgia, unspecified: Secondary | ICD-10-CM | POA: Diagnosis not present

## 2024-02-28 DIAGNOSIS — G8929 Other chronic pain: Secondary | ICD-10-CM | POA: Diagnosis not present

## 2024-02-28 DIAGNOSIS — Z86711 Personal history of pulmonary embolism: Secondary | ICD-10-CM | POA: Diagnosis not present

## 2024-02-28 DIAGNOSIS — H409 Unspecified glaucoma: Secondary | ICD-10-CM | POA: Diagnosis not present

## 2024-02-28 DIAGNOSIS — Z7901 Long term (current) use of anticoagulants: Secondary | ICD-10-CM | POA: Diagnosis not present

## 2024-02-28 DIAGNOSIS — R55 Syncope and collapse: Secondary | ICD-10-CM | POA: Diagnosis present

## 2024-02-28 DIAGNOSIS — K219 Gastro-esophageal reflux disease without esophagitis: Secondary | ICD-10-CM | POA: Diagnosis not present

## 2024-02-28 DIAGNOSIS — D631 Anemia in chronic kidney disease: Secondary | ICD-10-CM | POA: Diagnosis present

## 2024-02-28 DIAGNOSIS — F0390 Unspecified dementia without behavioral disturbance: Secondary | ICD-10-CM | POA: Diagnosis present

## 2024-02-28 DIAGNOSIS — K529 Noninfective gastroenteritis and colitis, unspecified: Secondary | ICD-10-CM | POA: Diagnosis present

## 2024-02-28 DIAGNOSIS — Z7401 Bed confinement status: Secondary | ICD-10-CM | POA: Diagnosis not present

## 2024-02-28 DIAGNOSIS — I5032 Chronic diastolic (congestive) heart failure: Secondary | ICD-10-CM | POA: Diagnosis not present

## 2024-02-28 DIAGNOSIS — M1990 Unspecified osteoarthritis, unspecified site: Secondary | ICD-10-CM | POA: Diagnosis not present

## 2024-02-28 DIAGNOSIS — R531 Weakness: Secondary | ICD-10-CM | POA: Diagnosis not present

## 2024-02-28 DIAGNOSIS — H54415A Blindness right eye category 5, normal vision left eye: Secondary | ICD-10-CM | POA: Diagnosis not present

## 2024-02-28 DIAGNOSIS — H5461 Unqualified visual loss, right eye, normal vision left eye: Secondary | ICD-10-CM | POA: Diagnosis present

## 2024-02-28 DIAGNOSIS — Z743 Need for continuous supervision: Secondary | ICD-10-CM | POA: Diagnosis not present

## 2024-02-28 DIAGNOSIS — N3281 Overactive bladder: Secondary | ICD-10-CM | POA: Diagnosis not present

## 2024-02-28 DIAGNOSIS — Z87891 Personal history of nicotine dependence: Secondary | ICD-10-CM | POA: Diagnosis not present

## 2024-02-28 DIAGNOSIS — J449 Chronic obstructive pulmonary disease, unspecified: Secondary | ICD-10-CM | POA: Diagnosis not present

## 2024-02-28 DIAGNOSIS — N1831 Chronic kidney disease, stage 3a: Secondary | ICD-10-CM | POA: Diagnosis not present

## 2024-02-28 DIAGNOSIS — Z8673 Personal history of transient ischemic attack (TIA), and cerebral infarction without residual deficits: Secondary | ICD-10-CM | POA: Diagnosis not present

## 2024-02-28 DIAGNOSIS — I69998 Other sequelae following unspecified cerebrovascular disease: Secondary | ICD-10-CM | POA: Diagnosis not present

## 2024-02-28 DIAGNOSIS — F1010 Alcohol abuse, uncomplicated: Secondary | ICD-10-CM | POA: Diagnosis present

## 2024-02-28 DIAGNOSIS — Z993 Dependence on wheelchair: Secondary | ICD-10-CM | POA: Diagnosis not present

## 2024-02-28 DIAGNOSIS — G629 Polyneuropathy, unspecified: Secondary | ICD-10-CM | POA: Diagnosis not present

## 2024-02-28 DIAGNOSIS — N179 Acute kidney failure, unspecified: Secondary | ICD-10-CM | POA: Diagnosis not present

## 2024-02-28 DIAGNOSIS — I130 Hypertensive heart and chronic kidney disease with heart failure and stage 1 through stage 4 chronic kidney disease, or unspecified chronic kidney disease: Secondary | ICD-10-CM | POA: Diagnosis not present

## 2024-02-28 DIAGNOSIS — Z9181 History of falling: Secondary | ICD-10-CM | POA: Diagnosis not present

## 2024-02-28 DIAGNOSIS — G4700 Insomnia, unspecified: Secondary | ICD-10-CM | POA: Diagnosis not present

## 2024-02-28 DIAGNOSIS — B9620 Unspecified Escherichia coli [E. coli] as the cause of diseases classified elsewhere: Secondary | ICD-10-CM | POA: Diagnosis present

## 2024-02-28 DIAGNOSIS — E876 Hypokalemia: Secondary | ICD-10-CM | POA: Diagnosis present

## 2024-02-28 DIAGNOSIS — E860 Dehydration: Secondary | ICD-10-CM | POA: Diagnosis present

## 2024-02-28 DIAGNOSIS — Z87442 Personal history of urinary calculi: Secondary | ICD-10-CM | POA: Diagnosis not present

## 2024-02-28 DIAGNOSIS — E785 Hyperlipidemia, unspecified: Secondary | ICD-10-CM | POA: Diagnosis not present

## 2024-02-28 DIAGNOSIS — E669 Obesity, unspecified: Secondary | ICD-10-CM | POA: Diagnosis present

## 2024-02-28 LAB — BASIC METABOLIC PANEL WITH GFR
Anion gap: 16 — ABNORMAL HIGH (ref 5–15)
BUN: 29 mg/dL — ABNORMAL HIGH (ref 8–23)
CO2: 20 mmol/L — ABNORMAL LOW (ref 22–32)
Calcium: 9.7 mg/dL (ref 8.9–10.3)
Chloride: 105 mmol/L (ref 98–111)
Creatinine, Ser: 2.23 mg/dL — ABNORMAL HIGH (ref 0.44–1.00)
GFR, Estimated: 21 mL/min — ABNORMAL LOW (ref >60)
Glucose, Bld: 80 mg/dL (ref 70–99)
Potassium: 3.5 mmol/L (ref 3.5–5.1)
Sodium: 141 mmol/L (ref 135–145)

## 2024-02-28 LAB — CBC
HCT: 43.7 % (ref 36.0–46.0)
Hemoglobin: 14.2 g/dL (ref 12.0–15.0)
MCH: 28.7 pg (ref 26.0–34.0)
MCHC: 32.5 g/dL (ref 30.0–36.0)
MCV: 88.3 fL (ref 80.0–100.0)
Platelets: 159 K/uL (ref 150–400)
RBC: 4.95 MIL/uL (ref 3.87–5.11)
RDW: 16.9 % — ABNORMAL HIGH (ref 11.5–15.5)
WBC: 7.2 K/uL (ref 4.0–10.5)
nRBC: 0 % (ref 0.0–0.2)

## 2024-02-28 MED ORDER — SODIUM CHLORIDE 0.9 % IV BOLUS
1000.0000 mL | Freq: Once | INTRAVENOUS | Status: AC
Start: 2024-02-28 — End: 2024-02-28
  Administered 2024-02-28: 1000 mL via INTRAVENOUS

## 2024-02-28 NOTE — Progress Notes (Signed)
 PROGRESS NOTE    Virginia Walters  FMW:[id number] DOB: 21-Jan-1941 DOA: 02/27/2024 PCP: Duanne Butler DASEN, MD    Brief Narrative:  Patient is 83 year old female with history of hypertension, hyperlipidemia, COPD, heart failure with preserved ejection fraction, known ejection fraction of 60%, pulmonary embolism status post thrombectomy on Eliquis , recent alcohol use disorder, chronic back pain, neuropathy and prior GI bleeds presented with fall.  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  Frequent admission with dehydration.  Apparently, she was ready to work with physical therapy and took long time to come to the door , while at the door she got dizzy and fell.  Reportedly she had a blood pressure of 88 systolic on EMS arrival. Physical therapist visiting her at home called EMS to pick her up. At the emergency room, afebrile.  Blood pressure 107/31.  BUN 25, creatinine 2.16, alcohol level undetectable.  Chest x-ray normal.  Patient was given IV fluids, urine cultures collected and admitted with IV fluids and antibiotics.  Subjective: Patient seen and examined.  She tells me that she had 1 or 2 loose bowel movements every day.  She tells me her appetite is fair.  She feels weak.  No change in urination.  Assessment & Plan:   AKI on CKD stage IIIa: Baseline creatinine about 1.1.  Presented with creatinine of 2.16.  Probably prerenal with ongoing diarrhea, use of antihypertensive medications including lisinopril . Continue maintenance IV fluids.  1 additional liter of isotonic fluid today.  Recheck levels tomorrow.  Urine output monitoring.  Acute on chronic diarrhea: Likely noninfective.  Reasonable to rule out C. difficile.  Syncope and collapse, suspect vasovagal or orthostatics: Discontinue all antihypertensives.  Check orthostatic symptoms.  IV fluids.  History of PE on chronic anticoagulation: Continue Eliquis .  No evidence of  bleeding.  Suspected UTI: Recently treated.  Continue Rocephin  today until urine cultures.  Chronic medical issues including Diastolic congestive heart failure, euvolemic today.  Needing IV fluids. COPD, stable.  On Breztri  and albuterol  nebs. GERD, on Protonix .    DVT prophylaxis:  apixaban  (ELIQUIS ) tablet 5 mg   Code Status: Full code Family Communication: None at the bedside Disposition Plan: Status is: Observation The patient will require care spanning > 2 midnights and should be moved to inpatient because: Acute kidney injury, dehydration, IV fluid     Consultants:  None  Procedures:  None  Antimicrobials:  Rocephin  7/15--     Objective: Vitals:   02/28/24 0640 02/28/24 0900 02/28/24 1208 02/28/24 1233  BP: (!) 154/56 (!) 142/66 (!) 153/70 (!) 147/68  Pulse: 63 74 65 63  Resp: 20 (!) 21 17   Temp:    98.7 F (37.1 C)  TempSrc:    Oral  SpO2: 96% 97% 97% 98%    Intake/Output Summary (Last 24 hours) at 02/28/2024 1248 Last data filed at 02/27/2024 2300 Gross per 24 hour  Intake 88.14 ml  Output --  Net 88.14 ml   There were no vitals filed for this visit.  Examination:  General exam: Appears calm and comfortable.  Pleasant and interactive.  She is almost alert awake and oriented.  She has gross generalized weakness.  No focal neurological deficits. Respiratory system: Clear to auscultation. Respiratory effort normal. Cardiovascular system: S1 & S2 heard, RRR. No JVD, murmurs, rubs, gallops or clicks. No pedal edema. Gastrointestinal system: Soft and nontender.  Obese and pendulous.   Central nervous system: Alert awake and mostly  oriented.  Gross weakness.  No focal deficits.      Data Reviewed: I have personally reviewed following labs and imaging studies  CBC: Recent Labs  Lab 02/23/24 1005 02/27/24 1340 02/28/24 0824  WBC 6.7 8.9 7.2  NEUTROABS 4.1 7.3  --   HGB 12.9 14.5 14.2  HCT 39.6 44.3 43.7  MCV 87.2 88.1 88.3  PLT 196 204 159    Basic Metabolic Panel: Recent Labs  Lab 02/23/24 1005 02/27/24 1340 02/28/24 0824  NA 137 141 141  K 3.5 3.9 3.5  CL 106 107 105  CO2 22 22 20*  GLUCOSE 104* 120* 80  BUN 15 25* 29*  CREATININE 1.13* 2.16* 2.23*  CALCIUM  9.3 10.2 9.7   GFR: Estimated Creatinine Clearance: 22.8 mL/min (A) (by C-G formula based on SCr of 2.23 mg/dL (H)). Liver Function Tests: Recent Labs  Lab 02/23/24 1005 02/27/24 1340  AST 19 23  ALT 17 21  ALKPHOS 59 58  BILITOT 0.7 0.9  PROT 6.9 7.4  ALBUMIN  3.8 3.8   No results for input(s): LIPASE, AMYLASE in the last 168 hours. No results for input(s): AMMONIA in the last 168 hours. Coagulation Profile: No results for input(s): INR, PROTIME in the last 168 hours. Cardiac Enzymes: No results for input(s): CKTOTAL, CKMB, CKMBINDEX, TROPONINI in the last 168 hours. BNP (last 3 results) No results for input(s): PROBNP in the last 8760 hours. HbA1C: No results for input(s): HGBA1C in the last 72 hours. CBG: No results for input(s): GLUCAP in the last 168 hours. Lipid Profile: No results for input(s): CHOL, HDL, LDLCALC, TRIG, CHOLHDL, LDLDIRECT in the last 72 hours. Thyroid  Function Tests: No results for input(s): TSH, T4TOTAL, FREET4, T3FREE, THYROIDAB in the last 72 hours. Anemia Panel: No results for input(s): VITAMINB12, FOLATE, FERRITIN, TIBC, IRON , RETICCTPCT in the last 72 hours. Sepsis Labs: No results for input(s): PROCALCITON, LATICACIDVEN in the last 168 hours.  No results found for this or any previous visit (from the past 240 hours).       Radiology Studies: US  RENAL Result Date: 02/27/2024 CLINICAL DATA:  Acute kidney injury EXAM: RENAL / URINARY TRACT ULTRASOUND COMPLETE COMPARISON:  10/07/2021 FINDINGS: Right Kidney: Renal measurements: 10.8 x 5.7 x 4.7 cm = volume: 150 mL. 5 x 4.8 x 4.9 cm cortical cyst exophytic from mid kidney. Parenchyma mildly hypoechoic  compared to adjacent liver. No hydronephrosis. Left Kidney: Renal measurements: 10.7 x 5 x 4.3 cm = volume: 120 mL. Cortical thinning. Normal cortical echotexture. No hydronephrosis. 0.9 x 0.8 cm probable cortical cyst. Bladder: Nondistended Other: Technologist describes technically difficult study secondary to body habitus and gas. IMPRESSION: 1. No hydronephrosis. 2. Bilateral renal cysts. Electronically Signed   By: JONETTA Faes M.D.   On: 02/27/2024 16:31   DG Chest Port 1 View Result Date: 02/27/2024 CLINICAL DATA:  Syncope EXAM: PORTABLE CHEST 1 VIEW COMPARISON:  December 08, 2023 FINDINGS: Chronic appearing bilateral interstitial changes. No focal consolidation. No pleural effusions. No pneumothorax. Cardiomediastinal silhouette is unchanged. Hiatal hernia. No acute osseous findings. IMPRESSION: No active disease.  Likely chronic interstitial changes. Electronically Signed   By: Michaeline Blanch M.D.   On: 02/27/2024 14:27        Scheduled Meds:  apixaban   5 mg Oral BID   budesonide -glycopyrrolate -formoterol   2 puff Inhalation BID   dorzolamide   1 drop Left Eye BID   doxazosin   2 mg Oral Daily   fesoterodine   4 mg Oral Daily   pantoprazole   40 mg  Oral Daily   sodium chloride  flush  3 mL Intravenous Q12H   thiamine   100 mg Oral Daily   traZODone   50 mg Oral QHS   Continuous Infusions:  sodium chloride  75 mL/hr at 02/27/24 1636   cefTRIAXone  (ROCEPHIN )  IV Stopped (02/27/24 2019)   sodium chloride        LOS: 0 days    Time spent: 51 minutes    Renato Applebaum, MD [facility]

## 2024-02-28 NOTE — Care Management (Signed)
 Called , updated and discussed with patient's daughter on the phone. She lives in [place]  and is HCPOA. According to daughter , her house is all soiled with stool and she had to call biohazard company to evaluate her house.  Patient is not able to go back to the house at least until it is cleared. Plan is to work with PT OT and referred to a SNF.  Daughter to talk with her mom about placing her into assisted living facility moving forward.

## 2024-02-28 NOTE — Telephone Encounter (Signed)
 Copied from CRM [id number]. Topic: General - Other >> Feb 27, 2024  4:36 PM Tiffini S wrote: Reason for CRM: Leita [phone] was there to do a physical therapy evaluation, patient took a very long time to come to the door , she passed out once at the door, Leita states that the patient did not hit the floor - that she was able to catch her. Leita called EMS to come to the patient location, her blood pressure reading was 88/52, Patient was transported to the ED. Will rescheduled physical therapy appointment- today will be a missed appointment.

## 2024-02-28 NOTE — Evaluation (Signed)
 Physical Therapy Evaluation Patient Details Name: Virginia Walters MRN: [id number] DOB: 1940/09/28 Today's Date: 02/28/2024  History of Present Illness  Pt is an 83 y.o. female presenting to hospital 12/01/23 after being found down in her yard by EMS after possible syncopal episode; incontinent of stool and urine; pt was doing colonoscopy prep.  Pt admitted with UTI, possible syncope, acute metabolic encephalopathy, AKI, symptomatic anemia.  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  Recent hospitalization 4/1 to 11/17/23 with symptomatic anemia requiring 3 units PRBC's complicated by acute encephalopathy.  Clinical Impression  PTA pt living alone in single story home with 6 steps to enter. Pt ambulating with cane in her home and RW in community. Pt working with HHPT to progress to community ambulation with SPC. Pt is currently limited in safe mobility by generalized weakness. Pt is min A for bed mobility and modA for transfers. PT recommends pt discharge to cousins house which has a level entry for a few days and resume HHPT, prior to return home alone. PT will continue to follow acutely and will refer to Mobility Specialist         If plan is discharge home, recommend the following: A little help with walking and/or transfers;A little help with bathing/dressing/bathroom;Assistance with cooking/housework;Assist for transportation;Help with stairs or ramp for entrance   Can travel by private vehicle    Yes    Equipment Recommendations None recommended by PT     Functional Status Assessment Patient has had a recent decline in their functional status and demonstrates the ability to make significant improvements in function in a reasonable and predictable amount of time.     Precautions / Restrictions Precautions Precautions: Fall      Mobility  Bed Mobility Overal bed mobility: Needs  Assistance Bed Mobility: Supine to Sit, Sit to Supine     Supine to sit: Min assist, Mod assist Sit to supine: Max assist   General bed mobility comments: min A for coming to upright and modA for pad scoot of hips to EoB, maxA for return to supine due to height of ED stretcher    Transfers Overall transfer level: Needs assistance Equipment used: Rolling walker (2 wheels) Transfers: Sit to/from Stand, Bed to chair/wheelchair/BSC Sit to Stand: Mod assist          Lateral/Scoot Transfers: Min assist General transfer comment: modA for steadying with standing from edge of stretcher, min A for support with lateral steps towards HoB    Ambulation/Gait               General Gait Details: deferred due to diarrhea        Balance Overall balance assessment: Needs assistance Sitting-balance support: No upper extremity supported, Feet unsupported, Bilateral upper extremity supported, Single extremity supported Sitting balance-Leahy Scale: Fair     Standing balance support: Bilateral upper extremity supported, During functional activity, Reliant on assistive device for balance Standing balance-Leahy Scale: Poor                               Pertinent Vitals/Pain Pain Assessment Pain Assessment: Faces Faces Pain Scale: Hurts little more Pain Location: bottom from diarrhea Pain Descriptors / Indicators: Sore Pain Intervention(s): Limited activity within patient's tolerance, Monitored during session, Repositioned    Home Living Family/patient expects to be discharged to:: Private residence Living Arrangements: Alone  Available Help at Discharge: Neighbor;Available PRN/intermittently Type of Home: House Home Access: Stairs to enter Entrance Stairs-Rails: Right Entrance Stairs-Number of Steps: 6   Home Layout: One level Home Equipment: Agricultural consultant (2 wheels);Shower seat;Cane - single point;Grab bars - tub/shower      Prior Function Prior Level of  Function : Independent/Modified Independent;Driving             Mobility Comments: Uses RW in home; uses SPC in community ADLs Comments: (+) driving; independent with ADL's and IADL's; assist for house cleaning every 2 weeks     Extremity/Trunk Assessment   Upper Extremity Assessment Upper Extremity Assessment: Generalized weakness    Lower Extremity Assessment Lower Extremity Assessment: Generalized weakness    Cervical / Trunk Assessment Cervical / Trunk Assessment: Kyphotic  Communication   Communication Communication: No apparent difficulties    Cognition Arousal: Alert Behavior During Therapy: WFL for tasks assessed/performed   PT - Cognitive impairments: No apparent impairments                         Following commands: Impaired       Cueing Cueing Techniques: Verbal cues, Gestural cues     General Comments General comments (skin integrity, edema, etc.): PBP in 130s/60s with positional change no c/o dizziness        Assessment/Plan    PT Assessment Patient needs continued PT services  PT Problem List Decreased strength;Decreased activity tolerance;Decreased balance;Decreased mobility       PT Treatment Interventions DME instruction;Gait training;Stair training;Functional mobility training;Therapeutic activities;Therapeutic exercise;Balance training;Cognitive remediation;Patient/family education    PT Goals (Current goals can be found in the Care Plan section)  Acute Rehab PT Goals PT Goal Formulation: With patient Time For Goal Achievement: 03/13/24 Potential to Achieve Goals: Fair    Frequency Min 2X/week        AM-PAC PT 6 Clicks Mobility  Outcome Measure Help needed turning from your back to your side while in a flat bed without using bedrails?: A Little Help needed moving from lying on your back to sitting on the side of a flat bed without using bedrails?: A Little Help needed moving to and from a bed to a chair (including a  wheelchair)?: A Little Help needed standing up from a chair using your arms (e.g., wheelchair or bedside chair)?: A Little Help needed to walk in hospital room?: A Little Help needed climbing 3-5 steps with a railing? : A Lot 6 Click Score: 17    End of Session Equipment Utilized During Treatment: Gait belt Activity Tolerance: Patient tolerated treatment well Patient left: in bed;with call bell/phone within reach Nurse Communication: Mobility status PT Visit Diagnosis: Unsteadiness on feet (R26.81);Muscle weakness (generalized) (M62.81);Difficulty in walking, not elsewhere classified (R26.2)    Time: 9061-8998 PT Time Calculation (min) (ACUTE ONLY): 23 min   Charges:   PT Evaluation $PT Eval Moderate Complexity: 1 Mod PT Treatments $Therapeutic Activity: 8-22 mins PT General Charges $$ ACUTE PT VISIT: 1 Visit         Aleja Yearwood B. Fleeta Lapidus PT, DPT Acute Rehabilitation Services Please use secure chat or  Call Office [phone]   Almarie KATHEE Fleeta [facility] 02/28/2024, 11:07 AM

## 2024-02-28 NOTE — ED Notes (Signed)
 Please update patient daughter Scientist, research (medical)

## 2024-02-28 NOTE — Evaluation (Signed)
 Occupational Therapy Evaluation Patient Details Name: Virginia Walters MRN: [id number] DOB: 16-Mar-1941 Today's Date: 02/28/2024   History of Present Illness   Pt is an 83 y.o. female presenting to hospital 12/01/23 after being found down in her yard by EMS after possible syncopal episode; incontinent of stool and urine; pt was doing colonoscopy prep.  Pt admitted with UTI, possible syncope, acute metabolic encephalopathy, AKI, symptomatic anemia.  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  Recent hospitalization 4/1 to 11/17/23 with symptomatic anemia requiring 3 units PRBC's complicated by acute encephalopathy.     Clinical Impressions Pt admitted based on above, and was seen based on problem list below. PTA pt was independent with ADLs and IADLs. Today pt is requiring set up  to CGA for ADLs. Bed mobility and functional transfers are  CGA. Noted cog impairments below, will continue to assess in future sessions. Pt would benefit from [facility] to promote safety within the home. OT will continue to follow acutely to maximize functional independence.        If plan is discharge home, recommend the following:   A little help with walking and/or transfers;A little help with bathing/dressing/bathroom;Assistance with cooking/housework;Direct supervision/assist for medications management     Functional Status Assessment   Patient has had a recent decline in their functional status and demonstrates the ability to make significant improvements in function in a reasonable and predictable amount of time.     Equipment Recommendations   None recommended by OT      Precautions/Restrictions   Precautions Precautions: Fall Recall of Precautions/Restrictions: Impaired Restrictions Weight Bearing Restrictions Per Provider Order: No     Mobility Bed Mobility Overal bed mobility: Needs  Assistance Bed Mobility: Supine to Sit, Sit to Supine     Supine to sit: Contact guard, HOB elevated, Used rails Sit to supine: Contact guard assist, HOB elevated, Used rails   General bed mobility comments: Increased time and effort    Transfers Overall transfer level: Needs assistance Equipment used: 1 person hand held assist Transfers: Sit to/from Stand, Bed to chair/wheelchair/BSC Sit to Stand: Contact guard assist     Step pivot transfers: Contact guard assist     General transfer comment: CGA for small distance in room with [facility] assist d/t lack of RW in room      Balance Overall balance assessment: Needs assistance Sitting-balance support: No upper extremity supported, Feet unsupported, Bilateral upper extremity supported, Single extremity supported Sitting balance-Leahy Scale: Fair     Standing balance support: No upper extremity supported, During functional activity Standing balance-Leahy Scale: Poor Standing balance comment: Benefits from UE support         ADL either performed or assessed with clinical judgement   ADL Overall ADL's : Needs assistance/impaired Eating/Feeding: Set up;Sitting   Grooming: Contact guard assist;Standing           Upper Body Dressing : Set up;Sitting   Lower Body Dressing: Contact guard assist;Sit to/from stand   Toilet Transfer: Contact guard assist;Ambulation Statistician Details (indicate cue type and reason): Simulated in room with HH assist Toileting- Clothing Manipulation and Hygiene: Contact guard assist;Sit to/from stand       Functional mobility during ADLs: Contact guard assist General ADL Comments: Delayed processing, decreased balance     Vision Baseline Vision/History: 0 No visual deficits Vision Assessment?: No apparent visual deficits  Pertinent Vitals/Pain Pain Assessment Pain Assessment: Faces Faces Pain Scale: Hurts a little bit Pain Location: back Pain Descriptors / Indicators:  Sore Pain Intervention(s): Limited activity within patient's tolerance     Extremity/Trunk Assessment Upper Extremity Assessment Upper Extremity Assessment: Generalized weakness   Lower Extremity Assessment Lower Extremity Assessment: Defer to PT evaluation   Cervical / Trunk Assessment Cervical / Trunk Assessment: Kyphotic   Communication Communication Communication: No apparent difficulties   Cognition Arousal: Alert Behavior During Therapy: WFL for tasks assessed/performed Cognition: Cognition impaired   Orientation impairments: Situation Awareness: Intellectual awareness intact, Online awareness impaired Memory impairment (select all impairments): Short-term memory Attention impairment (select first level of impairment): Sustained attention Executive functioning impairment (select all impairments): Problem solving OT - Cognition Comments: Decreased situational awareness, delayed processing speed       Following commands: Impaired Following commands impaired: Follows one step commands inconsistently     Cueing  General Comments   Cueing Techniques: Verbal cues;Gestural cues  VSS on RA           Home Living Family/patient expects to be discharged to:: Private residence Living Arrangements: Alone Available Help at Discharge: Neighbor;Available PRN/intermittently Type of Home: House Home Access: Stairs to enter [employer] of Steps: 6 Entrance Stairs-Rails: Right Home Layout: One level     Bathroom Shower/Tub: Producer, television/film/video: Standard Bathroom Accessibility: Yes   Home Equipment: Agricultural consultant (2 wheels);Shower seat;Cane - single point          Prior Functioning/Environment Prior Level of Function : Independent/Modified Independent;Driving             Mobility Comments: Uses RW in home; uses SPC in community ADLs Comments: Assist with house cleaning, reports manages own medications    OT Problem List: Decreased  strength;Decreased range of motion;Decreased activity tolerance;Impaired balance (sitting and/or standing);Decreased cognition;Decreased safety awareness;Decreased knowledge of use of DME or AE;Cardiopulmonary status limiting activity   OT Treatment/Interventions: Self-care/ADL training;Therapeutic exercise;Energy conservation;DME and/or AE instruction;Therapeutic activities;Patient/family education;Balance training      OT Goals(Current goals can be found in the care plan section)   Acute Rehab OT Goals Patient Stated Goal: To get comfortable OT Goal Formulation: With patient Time For Goal Achievement: 03/13/24 Potential to Achieve Goals: Good   OT Frequency:  Min 2X/week       AM-PAC OT 6 Clicks Daily Activity     Outcome Measure Help from another person eating meals?: None Help from another person taking care of personal grooming?: A Little Help from another person toileting, which includes using toliet, bedpan, or urinal?: A Little Help from another person bathing (including washing, rinsing, drying)?: A Little Help from another person to put on and taking off regular upper body clothing?: A Little Help from another person to put on and taking off regular lower body clothing?: A Little 6 Click Score: 19   End of Session Nurse Communication: Mobility status  Activity Tolerance: Patient tolerated treatment well Patient left: in bed;with call bell/phone within reach;with bed alarm set  OT Visit Diagnosis: Unsteadiness on feet (R26.81);Other abnormalities of gait and mobility (R26.89);History of falling (Z91.81);Muscle weakness (generalized) (M62.81)                Time: 8562-8549 OT Time Calculation (min): 13 min Charges:  OT General Charges $OT Visit: 1 Visit OT Evaluation $OT Eval Moderate Complexity: 1 Mod  Adrianne BROCKS, OT  Acute Rehabilitation Services Office [phone] Secure chat preferred   Adrianne GORMAN Savers 02/28/2024, 3:43 PM

## 2024-02-29 DIAGNOSIS — N179 Acute kidney failure, unspecified: Secondary | ICD-10-CM | POA: Diagnosis not present

## 2024-02-29 DIAGNOSIS — N189 Chronic kidney disease, unspecified: Secondary | ICD-10-CM | POA: Diagnosis not present

## 2024-02-29 LAB — BASIC METABOLIC PANEL WITH GFR
Anion gap: 10 (ref 5–15)
BUN: 25 mg/dL — ABNORMAL HIGH (ref 8–23)
CO2: 21 mmol/L — ABNORMAL LOW (ref 22–32)
Calcium: 9.2 mg/dL (ref 8.9–10.3)
Chloride: 107 mmol/L (ref 98–111)
Creatinine, Ser: 1.41 mg/dL — ABNORMAL HIGH (ref 0.44–1.00)
GFR, Estimated: 37 mL/min — ABNORMAL LOW (ref >60)
Glucose, Bld: 85 mg/dL (ref 70–99)
Potassium: 3.2 mmol/L — ABNORMAL LOW (ref 3.5–5.1)
Sodium: 138 mmol/L (ref 135–145)

## 2024-02-29 MED ORDER — POTASSIUM CHLORIDE CRYS ER 20 MEQ PO TBCR
40.0000 meq | EXTENDED_RELEASE_TABLET | Freq: Two times a day (BID) | ORAL | Status: AC
  Administered 2024-02-29 – 2024-03-01 (×4): 40 meq via ORAL
  Filled 2024-02-29 (×4): qty 2

## 2024-02-29 NOTE — Plan of Care (Signed)
  Problem: Skin Integrity: Goal: Risk for impaired skin integrity will decrease Outcome: Progressing   

## 2024-02-29 NOTE — Progress Notes (Signed)
 Spoke with pts daughter. Definitely thinks pt not ok to go home alone. [facility] are [facility]. Family is eager to chat with case management.

## 2024-02-29 NOTE — Progress Notes (Signed)
 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  Frequent admission with dehydration.  Apparently, she was ready to work with physical therapy and took long time to come to the door , while at the door she got dizzy and fell.  Reportedly she had a blood pressure of 88 systolic on EMS arrival. Physical therapist visiting her at home called EMS to pick her up. At the emergency room, afebrile.  Blood pressure 107/31.  BUN 25, creatinine 2.16, alcohol level undetectable.  Chest x-ray normal.  Patient was given IV fluids, urine cultures collected and admitted with IV fluids and antibiotics.  Subjective: Patient seen and examined.  Patient herself denies any complaints.  She tells me that she can go home and do physical therapy.  I recommended her to go to skilled nursing rehab.  I told her that she might have soiled her home, she got upset while stating this.  I talked to her about conversation with her daughter, she told me my daughter will be last person to talk to  Assessment & Plan:   AKI on CKD stage IIIa: Baseline creatinine about 1.1.  Presented with creatinine of 2.16.  Probably prerenal with ongoing diarrhea, use of antihypertensive medications including lisinopril . Appropriately improving.  Urine output is adequate.  Continue normal saline today.  Acute on chronic diarrhea: Likely noninfective.  No diarrhea since admission.  Discontinue precautions.  Symptomatic management.  Hypokalemia:  Replaced.  Monitor levels.  Syncope and collapse, suspect vasovagal or orthostatics: Discontinue all antihypertensives.  Monitor orthostatic symptoms.  History of PE on chronic anticoagulation: Continue Eliquis .  No evidence of bleeding.  Suspected UTI: Urine culture growing E. coli.  Continue Rocephin  until final cultures.  Chronic medical issues including Diastolic congestive heart failure, euvolemic today.  Needing IV fluids. COPD, stable.  On Breztri  and albuterol  nebs. GERD, on Protonix .  Social: See documentation 7/16.  Patient's daughter who lives in [place]  is her healthcare power of attorney.  She was notified that patient's home is all soiled with stool and patient is not safe to return home.  May need home safety evaluation before discharging.  For now, daughter has agreed for a SNF referral.  Patient does not want to go to SNF.  DVT prophylaxis:  apixaban  (ELIQUIS ) tablet 5 mg   Code Status: Full code Family Communication: Daughter on the phone. Disposition Plan: Status is: Inpatient.  Remains inpatient.  IV fluids.   Consultants:  None  Procedures:  None  Antimicrobials:  Rocephin  7/15--     Objective: Vitals:   02/28/24 1633 02/28/24 2052 02/29/24 0427 02/29/24 0829  BP: 131/62 (!) 154/67 (!) 142/77 (!) 144/53  Pulse: 78 77 76 66  Resp: 18 18 18 18   Temp: 98.6 F (37 C) 98.7 F (37.1 C) 98 F (36.7 C) 98.3 F (36.8 C)  TempSrc:      SpO2: 97% 92% 95% 97%    Intake/Output Summary (Last 24 hours) at 02/29/2024 1339 Last data filed at 02/29/2024 0500 Gross per 24 hour  Intake 651.2 ml  Output --  Net 651.2 ml   There were no vitals filed for this visit.  Examination:  General exam: Calm and comfortable.  Anxious while talking about her home situation. Respiratory system: Clear to auscultation. Respiratory effort normal. Cardiovascular system: S1 & S2 heard, RRR. No JVD, murmurs, rubs, gallops or clicks. No pedal edema. Gastrointestinal  system: Soft and nontender.  Obese and pendulous.   Central nervous system: Alert awake and mostly oriented.  Gross weakness.  No focal deficits.      Data Reviewed: I have personally reviewed following labs and imaging studies  CBC: Recent Labs  Lab 02/23/24 1005 02/27/24 1340 02/28/24 0824  WBC 6.7 8.9 7.2  NEUTROABS 4.1 7.3  --   HGB 12.9 14.5 14.2  HCT 39.6 44.3 43.7  MCV 87.2 88.1 88.3  PLT 196 204 159   Basic Metabolic Panel: Recent Labs  Lab 02/23/24 1005 02/27/24 1340 02/28/24 0824 02/29/24 0522  NA 137 141 141 138  K 3.5 3.9 3.5 3.2*  CL 106 107 105 107  CO2 22 22 20* 21*  GLUCOSE 104* 120* 80 85  BUN 15 25* 29* 25*  CREATININE 1.13* 2.16* 2.23* 1.41*  CALCIUM  9.3 10.2 9.7 9.2   GFR: Estimated Creatinine Clearance: 36.1 mL/min (A) (by C-G formula based on SCr of 1.41 mg/dL (H)). Liver Function Tests: Recent Labs  Lab 02/23/24 1005 02/27/24 1340  AST 19 23  ALT 17 21  ALKPHOS 59 58  BILITOT 0.7 0.9  PROT 6.9 7.4  ALBUMIN  3.8 3.8   No results for input(s): LIPASE, AMYLASE in the last 168 hours. No results for input(s): AMMONIA in the last 168 hours. Coagulation Profile: No results for input(s): INR, PROTIME in the last 168 hours. Cardiac Enzymes: No results for input(s): CKTOTAL, CKMB, CKMBINDEX, TROPONINI in the last 168 hours. BNP (last 3 results) No results for input(s): PROBNP in the last 8760 hours. HbA1C: No results for input(s): HGBA1C in the last 72 hours. CBG: No results for input(s): GLUCAP in the last 168 hours. Lipid Profile: No results for input(s): CHOL, HDL, LDLCALC, TRIG, CHOLHDL, LDLDIRECT in the last 72 hours. Thyroid  Function Tests: No results for input(s): TSH, T4TOTAL, FREET4, T3FREE, THYROIDAB in the last 72 hours. Anemia Panel: No results for input(s): VITAMINB12, FOLATE, FERRITIN, TIBC, IRON , RETICCTPCT in the last 72 hours. Sepsis Labs: No results for  input(s): PROCALCITON, LATICACIDVEN in the last 168 hours.  Recent Results (from the past 240 hours)  Urine Culture (for pregnant, neutropenic or urologic patients or patients with an indwelling urinary catheter)     Status: Abnormal (Preliminary result)   Collection Time: 02/27/24  3:29 PM   Specimen: Urine, Clean Catch  Result Value Ref Range Status   Specimen Description URINE, CLEAN CATCH  Final   Special Requests NONE  Final   Culture (A)  Final    >=100,000 COLONIES/mL ESCHERICHIA COLI Standardized susceptibility testing for this organism is not available. Performed at [facility], [address] [place], [place] [address]    Report Status PENDING  Incomplete         Radiology Studies: US  RENAL Result Date: 02/27/2024 CLINICAL DATA:  Acute kidney injury EXAM: RENAL / URINARY TRACT ULTRASOUND COMPLETE COMPARISON:  10/07/2021 FINDINGS: Right Kidney: Renal measurements: 10.8 x 5.7 x 4.7 cm = volume: 150 mL. 5 x 4.8 x 4.9 cm cortical cyst exophytic from mid kidney. Parenchyma mildly hypoechoic compared to adjacent liver. No hydronephrosis. Left Kidney: Renal measurements: 10.7 x 5 x 4.3 cm =  volume: 120 mL. Cortical thinning. Normal cortical echotexture. No hydronephrosis. 0.9 x 0.8 cm probable cortical cyst. Bladder: Nondistended Other: Technologist describes technically difficult study secondary to body habitus and gas. IMPRESSION: 1. No hydronephrosis. 2. Bilateral renal cysts. Electronically Signed   By: JONETTA Faes M.D.   On: 02/27/2024 16:31   DG Chest Port 1 View Result Date: 02/27/2024 CLINICAL DATA:  Syncope EXAM: PORTABLE CHEST 1 VIEW COMPARISON:  December 08, 2023 FINDINGS: Chronic appearing bilateral interstitial changes. No focal consolidation. No pleural effusions. No pneumothorax. Cardiomediastinal silhouette is unchanged. Hiatal hernia. No acute osseous findings. IMPRESSION: No active disease.  Likely chronic interstitial changes. Electronically Signed   By: Michaeline Blanch M.D.   On: 02/27/2024 14:27        Scheduled Meds:  apixaban   5 mg Oral BID   budesonide -glycopyrrolate -formoterol   2 puff Inhalation BID   dorzolamide   1 drop Left Eye BID   doxazosin   2 mg Oral Daily   fesoterodine   4 mg Oral Daily   pantoprazole   40 mg Oral Daily   potassium chloride   40 mEq Oral BID   sodium chloride  flush  3 mL Intravenous Q12H   thiamine   100 mg Oral Daily   traZODone   50 mg Oral QHS   Continuous Infusions:  sodium chloride  75 mL/hr at 02/28/24 2239   cefTRIAXone  (ROCEPHIN )  IV 1 g (02/28/24 1722)     LOS: 1 day    Time spent: 51 minutes    Renato Applebaum, MD [facility]

## 2024-02-29 NOTE — Progress Notes (Signed)
 Physical Therapy Treatment Patient Details Name: Virginia Walters MRN: [id number] DOB: 23-Jul-1941 Today's Date: 02/29/2024   History of Present Illness Pt is an 83 y.o. female presenting to hospital 12/01/23 after being found down in her yard by EMS after possible syncopal episode; incontinent of stool and urine; pt was doing colonoscopy prep.  Pt admitted with UTI, possible syncope, acute metabolic encephalopathy, AKI, symptomatic anemia.  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  Recent hospitalization 4/1 to 11/17/23 with symptomatic anemia requiring 3 units PRBC's complicated by acute encephalopathy.    PT Comments  Pt is slowly progressing towards goals. Currently pt is CGA for bed mobility, CGA to Min A for sit to stand and Min A for short distance gait without an AD. Pt is at a high risk for falls and has very poor balance/safety awareness for functional transitions. Pt has poor support. Due to pt current functional status, home set up and available assistance at home recommending skilled physical therapy services < 3 hours/day in order to address strength, balance and functional mobility to decrease risk for falls, injury, immobility, skin break down and re-hospitalization. Pt will require 24/7 physical assists of 25% intermittently and 24/7 supervision for safety on discharge from acute care hospital settting.      If plan is discharge home, recommend the following: A little help with walking and/or transfers;Assistance with cooking/housework;Assist for transportation;Help with stairs or ramp for entrance;Supervision due to cognitive status   Can travel by private vehicle     Yes  Equipment Recommendations  None recommended by PT       Precautions / Restrictions Precautions Precautions: Fall Recall of Precautions/Restrictions: Impaired Restrictions Weight Bearing Restrictions  Per Provider Order: No     Mobility  Bed Mobility Overal bed mobility: Needs Assistance Bed Mobility: Supine to Sit     Supine to sit: Contact guard, HOB elevated, Used rails     General bed mobility comments: Increased time and effort    Transfers Overall transfer level: Needs assistance Equipment used: 1 person hand held assist Transfers: Sit to/from Stand, Bed to chair/wheelchair/BSC Sit to Stand: Min assist   Step pivot transfers: Contact guard assist       General transfer comment: Min A for small distance in room with [facility] assist d/t lack of RW in room. Poor balance    Ambulation/Gait Ambulation/Gait assistance: Min assist Gait Distance (Feet): 15 Feet Assistive device: 1 person hand held assist Gait Pattern/deviations: Step-through pattern, Decreased stride length, Wide base of support Gait velocity: decreased Gait velocity interpretation: <1.31 ft/sec, indicative of household ambulator   General Gait Details: Short unsteady steps with wide BOS and flat foot initial contact. Pt uses physical therapist hand and bed rails.     Balance Overall balance assessment: Needs assistance Sitting-balance support: No upper extremity supported, Feet unsupported, Bilateral upper extremity supported, Single extremity supported Sitting balance-Leahy Scale: Fair     Standing balance support: No upper extremity supported, During functional activity Standing balance-Leahy Scale: Poor Standing balance comment: Benefits from UE support. Pt unaware of safety deficits .      Communication Communication Communication: No apparent difficulties  Cognition Arousal: Alert Behavior During Therapy: WFL for tasks assessed/performed   PT - Cognitive impairments: Problem solving, Safety/Judgement, Awareness, Memory, Attention, Orientation   Orientation impairments: Situation     PT - Cognition Comments: pt perseverating on cat poop that was in her  house that people thought was hers.  pt does not know why she is in the hospital when tried to orient to situation pt is unable to comprehend. Following commands: Impaired Following commands impaired: Follows one step commands inconsistently    Cueing Cueing Techniques: Verbal cues, Gestural cues     General Comments General comments (skin integrity, edema, etc.): no signs/symptoms of cardiac/respiratory distress with activity      Pertinent Vitals/Pain Pain Assessment Pain Assessment: No/denies pain     PT Goals (current goals can now be found in the care plan section) Acute Rehab PT Goals PT Goal Formulation: With patient Time For Goal Achievement: 03/13/24 Potential to Achieve Goals: Fair Progress towards PT goals: Progressing toward goals    Frequency    Min 2X/week      PT Plan  Updated DC recommendations       AM-PAC PT 6 Clicks Mobility   Outcome Measure  Help needed turning from your back to your side while in a flat bed without using bedrails?: A Little Help needed moving from lying on your back to sitting on the side of a flat bed without using bedrails?: A Little Help needed moving to and from a bed to a chair (including a wheelchair)?: A Little Help needed standing up from a chair using your arms (e.g., wheelchair or bedside chair)?: A Little Help needed to walk in hospital room?: A Little Help needed climbing 3-5 steps with a railing? : A Lot 6 Click Score: 17    End of Session Equipment Utilized During Treatment: Gait belt Activity Tolerance: Patient tolerated treatment well Patient left: in chair;with call bell/phone within reach;with chair alarm set Nurse Communication: Mobility status PT Visit Diagnosis: Unsteadiness on feet (R26.81);Muscle weakness (generalized) (M62.81);Difficulty in walking, not elsewhere classified (R26.2)     Time: 1206-1230 PT Time Calculation (min) (ACUTE ONLY): 24 min  Charges:    $Therapeutic Activity: 23-37 mins PT General Charges $$ ACUTE PT  VISIT: 1 Visit                    Dorothyann Maier, DPT, CLT  Acute Rehabilitation Services Office: [phone] (Secure chat preferred)    Dorothyann VEAR Maier 02/29/2024, 12:30 PM

## 2024-03-01 DIAGNOSIS — N179 Acute kidney failure, unspecified: Secondary | ICD-10-CM | POA: Diagnosis not present

## 2024-03-01 DIAGNOSIS — N189 Chronic kidney disease, unspecified: Secondary | ICD-10-CM | POA: Diagnosis not present

## 2024-03-01 LAB — URINE CULTURE: Culture: >=100000 — AB

## 2024-03-01 LAB — BASIC METABOLIC PANEL WITH GFR
Anion gap: 7 (ref 5–15)
BUN: 23 mg/dL (ref 8–23)
CO2: 20 mmol/L — ABNORMAL LOW (ref 22–32)
Calcium: 8.9 mg/dL (ref 8.9–10.3)
Chloride: 112 mmol/L — ABNORMAL HIGH (ref 98–111)
Creatinine, Ser: 1.17 mg/dL — ABNORMAL HIGH (ref 0.44–1.00)
GFR, Estimated: 47 mL/min — ABNORMAL LOW (ref >60)
Glucose, Bld: 76 mg/dL (ref 70–99)
Potassium: 4.1 mmol/L (ref 3.5–5.1)
Sodium: 139 mmol/L (ref 135–145)

## 2024-03-01 MED ORDER — AMLODIPINE BESYLATE 5 MG PO TABS
5.0000 mg | ORAL_TABLET | Freq: Every day | ORAL | Status: DC
  Administered 2024-03-01 – 2024-03-08 (×8): 5 mg via ORAL
  Filled 2024-03-01 (×8): qty 1

## 2024-03-01 NOTE — Progress Notes (Signed)
 Mobility Specialist Progress Note:    03/01/24 1430  Mobility  Activity Ambulated with assistance in hallway  Level of Assistance Contact guard assist, steadying assist  Assistive Device Front wheel walker  Distance Ambulated (ft) 150 ft  Activity Response Tolerated well  Mobility Referral Yes  Mobility visit 1 Mobility  Mobility Specialist Start Time (ACUTE ONLY) 1430  Mobility Specialist Stop Time (ACUTE ONLY) 1445  Mobility Specialist Time Calculation (min) (ACUTE ONLY) 15 min   Pt agreeable and eager to walk. Pt was happy to be OOB and on feet. No c/o any symptoms. Pt left in recliner w/ all needs met and RN notified  Venetia Keel Mobility Specialist Please Contact via SecureChat or Rehab Office at [phone]

## 2024-03-01 NOTE — NC FL2 (Signed)
 [place]  MEDICAID [id number] LEVEL OF CARE FORM     IDENTIFICATION  Patient Name: Virginia Walters Birthdate: 05/15/1941 Sex: female Admission Date (Current Location): 02/27/2024  [facility] and [place] Number:  Producer, television/film/video and Address:  The [place], [address], [place], [place] [address]      Provider Number: [id number]  Attending Physician Name and Address:  Raenelle Coria, MD  Relative Name and Phone Number:  Delon Dawn; Daughter; [phone]    Current Level of Care: Hospital Recommended Level of Care: Skilled Nursing Facility Prior Approval Number:    Date Approved/Denied:   PASRR Number: [id number]  Discharge Plan: SNF    Current Diagnoses: Patient Active Problem List   Diagnosis Date Noted   Syncope and collapse 02/28/2024   Urinary tract infection 02/27/2024   Diarrhea 02/27/2024   Fall at home, initial encounter 02/27/2024   Chronic diastolic CHF (congestive heart failure) (HCC) 02/27/2024   Obesity (BMI 30-39.9) 02/27/2024   Acute pulmonary embolism without acute cor pulmonale (HCC) 12/08/2023   Altered mental status 12/08/2023   Acute metabolic encephalopathy 12/01/2023   Syncope 12/01/2023   Chronic back pain 11/14/2023   Neurodermatitis 12/06/2022   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   Pressure injury of skin 10/08/2021   Acute renal failure superimposed on stage 3a chronic kidney disease (HCC) 10/07/2021   COPD (chronic obstructive pulmonary disease) (HCC) 10/07/2021   Acute on chronic respiratory failure with hypoxia (HCC) 09/21/2021   Insomnia 09/18/2021   Acute cholecystitis s/p perc cholecystotomy drain 09/17/2021 09/16/2021   Iron  deficiency anemia    Gastric polyp    Stomach irritation    Ectopic gastric mucosa    Polyp of colon    Angiodysplasia of intestinal tract    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, stage 3a (HCC) 02/10/2020   COPD  exacerbation (HCC) 10/08/2018   Chronic systolic heart failure (HCC) 08/29/2018   Acute on chronic heart failure with preserved ejection fraction (HCC)    Symptomatic anemia 07/20/2018   Dementia with behavioral problem 10/26/2016   Alcohol use disorder 10/26/2016   Mild cognitive impairment 10/12/2016   Gait abnormality 10/12/2016   Orthostatic dizziness 10/12/2016   Paresthesia 10/12/2016   Abnormal EKG 12/28/2010   Essential hypertension    Dyslipidemia    PFO (patent foramen ovale)    Retinal artery occlusion    GERD (gastroesophageal reflux disease)     Orientation RESPIRATION BLADDER Height & Weight     Self, Time, Place, Situation  Normal (Room Air) Incontinent Weight: 205 lb 4 oz (93.1 kg) Height:  5' 7 (170.2 cm)  BEHAVIORAL SYMPTOMS/MOOD NEUROLOGICAL BOWEL NUTRITION STATUS      Incontinent Diet (Please see discharge summary)  AMBULATORY STATUS COMMUNICATION OF NEEDS Skin   Limited Assist Verbally Normal                       Personal Care Assistance Level of Assistance  Bathing, Dressing, Feeding Bathing Assistance: Limited assistance Feeding assistance: Limited assistance Dressing Assistance: Limited assistance     Functional Limitations Info             SPECIAL CARE FACTORS FREQUENCY  PT (By licensed PT), OT (By licensed OT)     PT Frequency: 5x OT Frequency: 5x            Contractures Contractures Info: Not present    Additional Factors Info  Code Status,  Allergies Code Status Info: Full Code Allergies Info: Ibuprofen , Oxycodone            Current Medications (03/01/2024):  This is the current hospital active medication list Current Facility-Administered Medications  Medication Dose Route Frequency Provider Last Rate Last Admin   acetaminophen  (TYLENOL ) tablet 650 mg  650 mg Oral Q6H PRN Smith, Rondell A, MD   650 mg at 02/29/24 2103   Or   acetaminophen  (TYLENOL ) suppository 650 mg  650 mg Rectal Q6H PRN Smith, Rondell A, MD        albuterol  (PROVENTIL ) (2.5 MG/3ML) 0.083% nebulizer solution 2.5 mg  2.5 mg Nebulization Q6H PRN Claudene, Rondell A, MD       amLODipine  (NORVASC ) tablet 5 mg  5 mg Oral Daily Ghimire, Kuber, MD   5 mg at 03/01/24 1054   apixaban  (ELIQUIS ) tablet 5 mg  5 mg Oral BID Smith, Rondell A, MD   5 mg at 03/01/24 1054   budesonide -glycopyrrolate -formoterol  (BREZTRI ) 160-9-4.8 MCG/ACT inhaler 2 puff  2 puff Inhalation BID Claudene Reeves A, MD   2 puff at 02/29/24 2104   cefTRIAXone  (ROCEPHIN ) 1 g in sodium chloride  0.9 % 100 mL IVPB  1 g Intravenous Q24H Ghimire, Kuber, MD 200 mL/hr at 02/29/24 1653 1 g at 02/29/24 1653   dorzolamide  (TRUSOPT ) 2 % ophthalmic solution 1 drop  1 drop Left Eye BID Claudene Reeves A, MD   1 drop at 03/01/24 1055   doxazosin  (CARDURA ) tablet 2 mg  2 mg Oral Daily Smith, Rondell A, MD   2 mg at 03/01/24 1054   fesoterodine  (TOVIAZ ) tablet 4 mg  4 mg Oral Daily Smith, Rondell A, MD   4 mg at 03/01/24 1054   pantoprazole  (PROTONIX ) EC tablet 40 mg  40 mg Oral Daily Smith, Rondell A, MD   40 mg at 03/01/24 1054   potassium chloride  SA (KLOR-CON  M) CR tablet 40 mEq  40 mEq Oral BID Ghimire, Kuber, MD   40 mEq at 03/01/24 1054   sodium chloride  flush (NS) 0.9 % injection 3 mL  3 mL Intravenous Q12H Smith, Rondell A, MD   3 mL at 03/01/24 1057   thiamine  (VITAMIN B1) tablet 100 mg  100 mg Oral Daily Smith, Rondell A, MD   100 mg at 03/01/24 1054   traZODone  (DESYREL ) tablet 50 mg  50 mg Oral QHS Smith, Rondell A, MD   50 mg at 02/29/24 2103     Discharge Medications: Please see discharge summary for a list of discharge medications.  Relevant Imaging Results:  Relevant Lab Results:   Additional Information SS# [id number]  Lauraine FORBES Saa, LCSW

## 2024-03-01 NOTE — TOC CM/SW Note (Signed)
 RE: Virginia Walters Date of Birth: November 30, 1940 Date: 03/01/2024  Please be advised that the above-named patient will require a short-term nursing home stay - anticipated 30 days or less for rehabilitation and strengthening.  The plan is for return home.  Lauraine Saa, MSW, LCSW-A Transitions of Care  Clinical Social Worker I [phone]

## 2024-03-01 NOTE — NC FL2 (Signed)
 [place]  MEDICAID FL2 LEVEL OF CARE FORM     IDENTIFICATION  Patient Name: Virginia Walters Birthdate: 1940-11-07 Sex: female Admission Date (Current Location): 02/27/2024  [facility] and [place] Number:  Producer, television/film/video and Address:  The [place], [address], [place], [place] [address]      Provider Number: [id number]  Attending Physician Name and Address:  Raenelle Coria, MD  Relative Name and Phone Number:  Delon Dawn; Daughter; [phone]    Current Level of Care: Hospital Recommended Level of Care: Skilled Nursing Facility Prior Approval Number:    Date Approved/Denied:   PASRR Number:    Discharge Plan: SNF    Current Diagnoses: Patient Active Problem List   Diagnosis Date Noted   Syncope and collapse 02/28/2024   Urinary tract infection 02/27/2024   Diarrhea 02/27/2024   Fall at home, initial encounter 02/27/2024   Chronic diastolic CHF (congestive heart failure) (HCC) 02/27/2024   Obesity (BMI 30-39.9) 02/27/2024   Acute pulmonary embolism without acute cor pulmonale (HCC) 12/08/2023   Altered mental status 12/08/2023   Acute metabolic encephalopathy 12/01/2023   Syncope 12/01/2023   Chronic back pain 11/14/2023   Neurodermatitis 12/06/2022   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   Pressure injury of skin 10/08/2021   Acute renal failure superimposed on stage 3a chronic kidney disease (HCC) 10/07/2021   COPD (chronic obstructive pulmonary disease) (HCC) 10/07/2021   Acute on chronic respiratory failure with hypoxia (HCC) 09/21/2021   Insomnia 09/18/2021   Acute cholecystitis s/p perc cholecystotomy drain 09/17/2021 09/16/2021   Iron  deficiency anemia    Gastric polyp    Stomach irritation    Ectopic gastric mucosa    Polyp of colon    Angiodysplasia of intestinal tract    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, stage 3a (HCC) 02/10/2020   COPD  exacerbation (HCC) 10/08/2018   Chronic systolic heart failure (HCC) 08/29/2018   Acute on chronic heart failure with preserved ejection fraction (HCC)    Symptomatic anemia 07/20/2018   Dementia with behavioral problem 10/26/2016   Alcohol use disorder 10/26/2016   Mild cognitive impairment 10/12/2016   Gait abnormality 10/12/2016   Orthostatic dizziness 10/12/2016   Paresthesia 10/12/2016   Abnormal EKG 12/28/2010   Essential hypertension    Dyslipidemia    PFO (patent foramen ovale)    Retinal artery occlusion    GERD (gastroesophageal reflux disease)     Orientation RESPIRATION BLADDER Height & Weight     Self, Time, Place, Situation  Normal (Room Air) Incontinent Weight: 205 lb 4 oz (93.1 kg) Height:  5' 7 (170.2 cm)  BEHAVIORAL SYMPTOMS/MOOD NEUROLOGICAL BOWEL NUTRITION STATUS      Incontinent Diet (Please see discharge summary)  AMBULATORY STATUS COMMUNICATION OF NEEDS Skin   Limited Assist Verbally Normal                       Personal Care Assistance Level of Assistance  Bathing, Dressing, Feeding Bathing Assistance: Limited assistance Feeding assistance: Limited assistance Dressing Assistance: Limited assistance     Functional Limitations Info             SPECIAL CARE FACTORS FREQUENCY  PT (By licensed PT), OT (By licensed OT)     PT Frequency: 5x OT Frequency: 5x            Contractures Contractures Info: Not present    Additional Factors Info  Code  Status, Allergies Code Status Info: Full Code Allergies Info: Ibuprofen , Oxycodone            Current Medications (03/01/2024):  This is the current hospital active medication list Current Facility-Administered Medications  Medication Dose Route Frequency Provider Last Rate Last Admin   acetaminophen  (TYLENOL ) tablet 650 mg  650 mg Oral Q6H PRN Smith, Rondell A, MD   650 mg at 02/29/24 2103   Or   acetaminophen  (TYLENOL ) suppository 650 mg  650 mg Rectal Q6H PRN Smith, Rondell A, MD        albuterol  (PROVENTIL ) (2.5 MG/3ML) 0.083% nebulizer solution 2.5 mg  2.5 mg Nebulization Q6H PRN Smith, Rondell A, MD       amLODipine  (NORVASC ) tablet 5 mg  5 mg Oral Daily Ghimire, Renato, MD       apixaban  (ELIQUIS ) tablet 5 mg  5 mg Oral BID Smith, Rondell A, MD   5 mg at 02/29/24 2102   budesonide -glycopyrrolate -formoterol  (BREZTRI ) 160-9-4.8 MCG/ACT inhaler 2 puff  2 puff Inhalation BID Claudene Reeves A, MD   2 puff at 02/29/24 2104   cefTRIAXone  (ROCEPHIN ) 1 g in sodium chloride  0.9 % 100 mL IVPB  1 g Intravenous Q24H Smith, Rondell A, MD 200 mL/hr at 02/29/24 1653 1 g at 02/29/24 1653   dorzolamide  (TRUSOPT ) 2 % ophthalmic solution 1 drop  1 drop Left Eye BID Claudene Reeves A, MD   1 drop at 02/29/24 2104   doxazosin  (CARDURA ) tablet 2 mg  2 mg Oral Daily Smith, Rondell A, MD   2 mg at 02/29/24 1038   fesoterodine  (TOVIAZ ) tablet 4 mg  4 mg Oral Daily Smith, Rondell A, MD   4 mg at 02/29/24 1038   pantoprazole  (PROTONIX ) EC tablet 40 mg  40 mg Oral Daily Smith, Rondell A, MD   40 mg at 02/29/24 1037   potassium chloride  SA (KLOR-CON  M) CR tablet 40 mEq  40 mEq Oral BID Ghimire, Kuber, MD   40 mEq at 02/29/24 2102   sodium chloride  flush (NS) 0.9 % injection 3 mL  3 mL Intravenous Q12H Smith, Rondell A, MD   3 mL at 02/29/24 1045   thiamine  (VITAMIN B1) tablet 100 mg  100 mg Oral Daily Smith, Rondell A, MD   100 mg at 02/29/24 1038   traZODone  (DESYREL ) tablet 50 mg  50 mg Oral QHS Smith, Rondell A, MD   50 mg at 02/29/24 2103     Discharge Medications: Please see discharge summary for a list of discharge medications.  Relevant Imaging Results:  Relevant Lab Results:   Additional Information SS# [id number]  Lauraine FORBES Saa, LCSW

## 2024-03-01 NOTE — Progress Notes (Signed)
 PROGRESS NOTE    Virginia Walters  FMW:[id number] DOB: [birth year] DOA: 02/27/2024 PCP: Duanne Butler DASEN, MD    Brief Narrative:  Patient is 83 year old female with history of hypertension, hyperlipidemia, COPD, heart failure with preserved ejection fraction, known ejection fraction of 60%, pulmonary embolism status post thrombectomy on Eliquis , recent alcohol use disorder, chronic back pain, neuropathy and prior GI bleeds presented with fall.  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  Frequent admission with dehydration.  Apparently, she was ready to work with physical therapy and took long time to come to the door , while at the door she got dizzy and fell.  Reportedly she had a blood pressure of 88 systolic on EMS arrival. Physical therapist visiting her at home called EMS to pick her up. At the emergency room, afebrile.  Blood pressure 107/31.  BUN 25, creatinine 2.16, alcohol level undetectable.  Chest x-ray normal.  Patient was given IV fluids, urine cultures collected and admitted with IV fluids and antibiotics.  Subjective: Patient seen and examined.  No overnight events.  Patient hesitantly agreed to go to a skilled nursing facility.  Assessment & Plan:   AKI on CKD stage IIIa: Baseline creatinine about 1.1.  Presented with creatinine of 2.16.  Probably prerenal with ongoing diarrhea, use of antihypertensive medications including lisinopril . Appropriately improving.  Urine output is adequate.   Discontinue normal saline.  Encourage oral intake.  Acute on chronic diarrhea: Likely noninfective.  No diarrhea since admission.  Discontinue precautions.  Symptomatic management.  Hypokalemia: Replaced.  Adequate.  Syncope and collapse, suspect vasovagal or orthostatics: Discontinue lisinopril .  Monitor orthostatic symptoms.  History of PE on chronic anticoagulation: Continue Eliquis .  No evidence of bleeding.  UTI present  on admission: Urine culture growing E. coli.  Rocephin  day 3 today.  Will change to oral antibiotics to complete 7 days of therapy.  Essential hypertension: Blood pressure improving now and elevated.  Lisinopril  discontinued.  Will start patient on her amlodipine  today.  Chronic medical issues including Diastolic congestive heart failure, euvolemic today.   COPD, stable.  On Breztri  and albuterol  nebs. GERD, on Protonix .   DVT prophylaxis:  apixaban  (ELIQUIS ) tablet 5 mg   Code Status: Full code Family Communication: None today. Disposition Plan: Status is: Inpatient.  Remains inpatient.  Medically stable.  Needs SNF bed.   Consultants:  None  Procedures:  None  Antimicrobials:  Rocephin  7/15--     Objective: Vitals:   02/29/24 2039 03/01/24 0339 03/01/24 0427 03/01/24 0755  BP: (!) 183/77  (!) 184/70 (!) 152/85  Pulse: 74  61 60  Resp: 18  18   Temp: 98.2 F (36.8 C)  98 F (36.7 C) 98.2 F (36.8 C)  TempSrc:    Oral  SpO2: 95%  94% 95%  Weight:  93.1 kg    Height:  5' 7 (1.702 m)      Intake/Output Summary (Last 24 hours) at 03/01/2024 1310 Last data filed at 03/01/2024 0426 Gross per 24 hour  Intake 1402.11 ml  Output 900 ml  Net 502.11 ml   Filed Weights   03/01/24 0339  Weight: 93.1 kg    Examination:  General exam: Looks fairly comfortable.  Flat affect. Respiratory system: Clear to auscultation. Respiratory effort normal. Cardiovascular system: S1 & S2 heard, RRR. No JVD, murmurs, rubs, gallops or clicks. No pedal edema. Gastrointestinal system: Soft and nontender.  Obese and pendulous.   Central nervous system:  Alert awake and mostly oriented.  Gross weakness.  No focal deficits.      Data Reviewed: I have personally reviewed following labs and imaging studies  CBC: Recent Labs  Lab 02/27/24 1340 02/28/24 0824  WBC 8.9 7.2  NEUTROABS 7.3  --   HGB 14.5 14.2  HCT 44.3 43.7  MCV 88.1 88.3  PLT 204 159   Basic Metabolic  Panel: Recent Labs  Lab 02/27/24 1340 02/28/24 0824 02/29/24 0522 03/01/24 0416  NA 141 141 138 139  K 3.9 3.5 3.2* 4.1  CL 107 105 107 112*  CO2 22 20* 21* 20*  GLUCOSE 120* 80 85 76  BUN 25* 29* 25* 23  CREATININE 2.16* 2.23* 1.41* 1.17*  CALCIUM  10.2 9.7 9.2 8.9   GFR: Estimated Creatinine Clearance: 43.4 mL/min (A) (by C-G formula based on SCr of 1.17 mg/dL (H)). Liver Function Tests: Recent Labs  Lab 02/27/24 1340  AST 23  ALT 21  ALKPHOS 58  BILITOT 0.9  PROT 7.4  ALBUMIN  3.8   No results for input(s): LIPASE, AMYLASE in the last 168 hours. No results for input(s): AMMONIA in the last 168 hours. Coagulation Profile: No results for input(s): INR, PROTIME in the last 168 hours. Cardiac Enzymes: No results for input(s): CKTOTAL, CKMB, CKMBINDEX, TROPONINI in the last 168 hours. BNP (last 3 results) No results for input(s): PROBNP in the last 8760 hours. HbA1C: No results for input(s): HGBA1C in the last 72 hours. CBG: No results for input(s): GLUCAP in the last 168 hours. Lipid Profile: No results for input(s): CHOL, HDL, LDLCALC, TRIG, CHOLHDL, LDLDIRECT in the last 72 hours. Thyroid  Function Tests: No results for input(s): TSH, T4TOTAL, FREET4, T3FREE, THYROIDAB in the last 72 hours. Anemia Panel: No results for input(s): VITAMINB12, FOLATE, FERRITIN, TIBC, IRON , RETICCTPCT in the last 72 hours. Sepsis Labs: No results for input(s): PROCALCITON, LATICACIDVEN in the last 168 hours.  Recent Results (from the past 240 hours)  Urine Culture (for pregnant, neutropenic or urologic patients or patients with an indwelling urinary catheter)     Status: Abnormal   Collection Time: 02/27/24  3:29 PM   Specimen: Urine, Clean Catch  Result Value Ref Range Status   Specimen Description URINE, CLEAN CATCH  Final   Special Requests NONE  Final   Culture (A)  Final    >=100,000 COLONIES/mL ESCHERICHIA  COLI Two isolates with different morphologies were identified as the same organism.The most resistant organism was reported. Performed at [facility], [address] [place], [place] [address]    Report Status 03/01/2024 FINAL  Final   Organism ID, Bacteria ESCHERICHIA COLI (A)  Final      Susceptibility   Escherichia coli - MIC*    AMPICILLIN >=32 RESISTANT Resistant     CEFAZOLIN  <=4 SENSITIVE Sensitive     CEFEPIME <=0.12 SENSITIVE Sensitive     CEFTRIAXONE  <=0.25 SENSITIVE Sensitive     CIPROFLOXACIN <=0.25 SENSITIVE Sensitive     GENTAMICIN <=1 SENSITIVE Sensitive     IMIPENEM <=0.25 SENSITIVE Sensitive     NITROFURANTOIN  <=16 SENSITIVE Sensitive     TRIMETH/SULFA <=20 SENSITIVE Sensitive     AMPICILLIN/SULBACTAM >=32 RESISTANT Resistant     PIP/TAZO <=4 SENSITIVE Sensitive ug/mL    * >=100,000 COLONIES/mL ESCHERICHIA COLI         Radiology Studies: No results found.       Scheduled Meds:  amLODipine   5 mg Oral Daily   apixaban   5 mg Oral BID  budesonide -glycopyrrolate -formoterol   2 puff Inhalation BID   dorzolamide   1 drop Left Eye BID   doxazosin   2 mg Oral Daily   fesoterodine   4 mg Oral Daily   pantoprazole   40 mg Oral Daily   potassium chloride   40 mEq Oral BID   sodium chloride  flush  3 mL Intravenous Q12H   thiamine   100 mg Oral Daily   traZODone   50 mg Oral QHS   Continuous Infusions:  cefTRIAXone  (ROCEPHIN )  IV 1 g (02/29/24 1653)     LOS: 2 days    Time spent: 40 minutes    Renato Applebaum, MD [facility]

## 2024-03-01 NOTE — Plan of Care (Signed)

## 2024-03-01 NOTE — TOC Initial Note (Addendum)
 Transition of Care [facility]) - Initial/Assessment Note    Patient Details  Name: Virginia Walters MRN: [id number] Date of Birth: [birth year]  [facility]) CM/SW Contact:    Lauraine FORBES Saa, LCSW Phone Number: 03/01/2024, 10:00 AM  Clinical Narrative:                  10:00 AM Patient's APS social worker, Arch [phone]) called CSW regarding patient's expected disposition plan. CSW informed Arch of physical therapy's recommendation of patient discharging to SNF. Per chart review, patient has history of dementia and patient's daughter expressed interest in patient discharging to [facility]. CSW attempted to call patient's daughter, but there was no response and a voicemail was left. CSW sent referrals to SNFs within [facility]  2:34 PM CSW submitted additional clinicals required for Level II PASRR. PASRR remains pending.  3:30 PM Patient received PASRRE ([id number]) valid 03/01/2024-03/31/2024.  Expected Discharge Plan: Skilled Nursing Facility Barriers to Discharge: Continued Medical Work up, English as a second language teacher, SNF Pending bed offer   Patient Goals and CMS Choice            Expected Discharge Plan and Services In-house Referral: Clinical Social Work   Post Acute Care Choice: Skilled Nursing Facility Living arrangements for the past 2 months: Single Family Home                                      Prior Living Arrangements/Services Living arrangements for the past 2 months: Single Family Home Lives with:: Self Patient language and need for interpreter reviewed:: Yes        Need for Family Participation in Patient Care: Yes (Comment)     Criminal Activity/Legal Involvement Pertinent to Current Situation/Hospitalization: No - Comment as needed  Activities of Daily Living   ADL Screening (condition at time of admission) Independently performs ADLs?: No Does the patient have a NEW difficulty with  bathing/dressing/toileting/self-feeding that is expected to last >3 days?: Yes (Initiates electronic notice to provider for possible OT consult) Does the patient have a NEW difficulty with getting in/out of bed, walking, or climbing stairs that is expected to last >3 days?: Yes (Initiates electronic notice to provider for possible PT consult) Does the patient have a NEW difficulty with communication that is expected to last >3 days?: No Is the patient deaf or have difficulty hearing?: No Does the patient have difficulty seeing, even when wearing glasses/contacts?: No Does the patient have difficulty concentrating, remembering, or making decisions?: Yes  Permission Sought/Granted Permission sought to share information with : Family Supports, Oceanographer granted to share information with : No (Contact information on chart)  Share Information with NAME: Delon Dawn  Permission granted to share info w AGENCY: SNF  Permission granted to share info w Relationship: Daughter  Permission granted to share info w Contact Information: [phone]  Emotional Assessment Appearance:: Appears stated age     Orientation: : Oriented to Self, Oriented to Place, Oriented to  Time, Oriented to Situation Alcohol / Substance Use: Not Applicable Psych Involvement: No (comment)  Admission diagnosis:  AKI (acute kidney injury) (HCC) [N17.9] Syncope, unspecified syncope type [R55] Syncope and collapse [R55] Patient Active Problem List   Diagnosis Date Noted   Syncope and collapse 02/28/2024   Urinary tract infection 02/27/2024   Diarrhea 02/27/2024   Fall at home, initial encounter 02/27/2024   Chronic diastolic CHF (congestive  heart failure) (HCC) 02/27/2024   Obesity (BMI 30-39.9) 02/27/2024   Acute pulmonary embolism without acute cor pulmonale (HCC) 12/08/2023   Altered mental status 12/08/2023   Acute metabolic encephalopathy 12/01/2023   Syncope 12/01/2023    Chronic back pain 11/14/2023   Neurodermatitis 12/06/2022   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   Pressure injury of skin 10/08/2021   Acute renal failure superimposed on stage 3a chronic kidney disease (HCC) 10/07/2021   COPD (chronic obstructive pulmonary disease) (HCC) 10/07/2021   Acute on chronic respiratory failure with hypoxia (HCC) 09/21/2021   Insomnia 09/18/2021   Acute cholecystitis s/p perc cholecystotomy drain 09/17/2021 09/16/2021   Iron  deficiency anemia    Gastric polyp    Stomach irritation    Ectopic gastric mucosa    Polyp of colon    Angiodysplasia of intestinal tract    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, stage 3a (HCC) 02/10/2020   COPD exacerbation (HCC) 10/08/2018   Chronic systolic heart failure (HCC) 08/29/2018   Acute on chronic heart failure with preserved ejection fraction (HCC)    Symptomatic anemia 07/20/2018   Dementia with behavioral problem 10/26/2016   Alcohol use disorder 10/26/2016   Mild cognitive impairment 10/12/2016   Gait abnormality 10/12/2016   Orthostatic dizziness 10/12/2016   Paresthesia 10/12/2016   Abnormal EKG 12/28/2010   Essential hypertension    Dyslipidemia    PFO (patent foramen ovale)    Retinal artery occlusion    GERD (gastroesophageal reflux disease)    PCP:  Duanne Butler DASEN, MD Pharmacy:   [facility]) - [place], [place] - [address] [facility] [address] [address] [place] [address] Phone: [phone] Fax: [phone]  [facility] - [place], [place] - [address] [place] [address] Phone: [phone] Fax: [phone]  [facility] - [place], [place] - [address] [place] [address] Phone: [phone] Fax: [phone]  [facility] [address] [place]  [address] Phone: [phone] Fax: [phone]     Social Drivers of Health (SDOH) Social History: SDOH Screenings   Food Insecurity: No Food Insecurity (02/28/2024)  Housing: Low Risk  (02/28/2024)  Transportation Needs: No Transportation Needs (02/28/2024)  Utilities: Not At Risk (02/28/2024)  Alcohol Screen: Low Risk  (05/25/2022)  Depression (PHQ2-9): Low Risk  (02/23/2024)  Financial Resource Strain: Low Risk  (05/25/2022)  Physical Activity: Inactive (05/25/2022)  Social Connections: Unknown (02/28/2024)  Stress: No Stress Concern Present (05/25/2022)  Tobacco Use: Medium Risk (02/27/2024)   SDOH Interventions:     Readmission Risk Interventions    09/20/2021   12:40 PM  Readmission Risk Prevention Plan  Transportation Screening Complete  PCP or Specialist Appt within 5-7 Days Complete  Home Care Screening Complete  Medication Review (RN CM) Complete

## 2024-03-01 NOTE — Care Management Important Message (Signed)
 Important Message  Patient Details  Name: Virginia Walters MRN: [id number] Date of Birth: 05-Feb-1941   Important Message Given:  Yes - Medicare IM     Claretta Deed 03/01/2024, 4:27 PM

## 2024-03-02 DIAGNOSIS — N189 Chronic kidney disease, unspecified: Secondary | ICD-10-CM | POA: Diagnosis not present

## 2024-03-02 DIAGNOSIS — N179 Acute kidney failure, unspecified: Secondary | ICD-10-CM | POA: Diagnosis not present

## 2024-03-02 MED ORDER — CEPHALEXIN 500 MG PO CAPS
500.0000 mg | ORAL_CAPSULE | Freq: Three times a day (TID) | ORAL | Status: AC
  Administered 2024-03-02 (×3): 500 mg via ORAL
  Filled 2024-03-02 (×3): qty 1

## 2024-03-02 NOTE — Progress Notes (Signed)
 PROGRESS NOTE    Virginia Walters  FMW:[id number] DOB: 07-Jun-1941 DOA: 02/27/2024 PCP: Duanne Butler DASEN, MD    Brief Narrative:  Patient is 83 year old female with history of hypertension, hyperlipidemia, COPD, heart failure with preserved ejection fraction, known ejection fraction of 60%, pulmonary embolism status post thrombectomy on Eliquis , recent alcohol use disorder, chronic back pain, neuropathy and prior GI bleeds presented with fall.  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  Frequent admission with dehydration.  Apparently, she was ready to work with physical therapy and took long time to come to the door , while at the door she got dizzy and fell.  Reportedly she had a blood pressure of 88 systolic on EMS arrival. Physical therapist visiting her at home called EMS to pick her up. At the emergency room, afebrile.  Blood pressure 107/31.  BUN 25, creatinine 2.16, alcohol level undetectable.  Chest x-ray normal.  Patient was given IV fluids, urine cultures collected and admitted with IV fluids and antibiotics.  Subjective: Patient seen and examined.  No overnight events.   Eating an drinking well.   Assessment & Plan:   AKI on CKD stage IIIa: Baseline creatinine about 1.1.  Presented with creatinine of 2.16.  Probably prerenal with ongoing diarrhea, use of antihypertensive medications including lisinopril . Appropriately improving.  Urine output is adequate.   Now maintaining on oral nutrition.  Acute on chronic diarrhea: Likely noninfective.  No diarrhea since admission.  Discontinue precautions.  Symptomatic management.  Hypokalemia: Replaced.  Adequate.  Syncope and collapse, suspect vasovagal or orthostatics: Discontinue lisinopril .  Monitor orthostatic symptoms.  History of PE on chronic anticoagulation: Continue Eliquis .  No evidence of bleeding.  UTI present on admission: Urine culture growing E. coli.   Rocephin  day 4.   Will change to oral antibiotics to complete 7 days of therapy.  Essential hypertension: Blood pressure improving now and elevated.  Lisinopril  discontinued.  Started on amlodipine .  Chronic medical issues including Diastolic congestive heart failure, euvolemic today.   COPD, stable.  On Breztri  and albuterol  nebs. GERD, on Protonix .   DVT prophylaxis:  apixaban  (ELIQUIS ) tablet 5 mg   Code Status: Full code Family Communication: Daughter on the phone. Disposition Plan: Status is: Inpatient.  Remains inpatient.  Medically stable.  Needs SNF bed.   Consultants:  None  Procedures:  None  Antimicrobials:  Rocephin  7/15--7/19 Keflex  7/19--     Objective: Vitals:   03/01/24 2054 03/02/24 0508 03/02/24 0750 03/02/24 0807  BP:  (!) 173/67 (!) 170/69   Pulse:  69 (!) 57   Resp:  20 17   Temp:  98 F (36.7 C)    TempSrc:  Oral    SpO2: 95% 98% 99% 95%  Weight:      Height:        Intake/Output Summary (Last 24 hours) at 03/02/2024 0853 Last data filed at 03/02/2024 0527 Gross per 24 hour  Intake 1260.11 ml  Output 1450 ml  Net -189.89 ml   Filed Weights   03/01/24 0339  Weight: 93.1 kg    Examination:  General exam: Looks fairly comfortable.  Interactive. Respiratory system: Clear to auscultation. Respiratory effort normal. Cardiovascular system: S1 & S2 heard, RRR. No JVD, murmurs, rubs, gallops or clicks. No pedal edema. Gastrointestinal system: Soft and nontender.  Obese and pendulous.   Central nervous system: Alert awake and mostly oriented.  She has some generalized weakness.  No focal deficits.  Data Reviewed: I have personally reviewed following labs and imaging studies  CBC: Recent Labs  Lab 02/27/24 1340 02/28/24 0824  WBC 8.9 7.2  NEUTROABS 7.3  --   HGB 14.5 14.2  HCT 44.3 43.7  MCV 88.1 88.3  PLT 204 159   Basic Metabolic Panel: Recent Labs  Lab 02/27/24 1340 02/28/24 0824 02/29/24 0522 03/01/24 0416  NA 141  141 138 139  K 3.9 3.5 3.2* 4.1  CL 107 105 107 112*  CO2 22 20* 21* 20*  GLUCOSE 120* 80 85 76  BUN 25* 29* 25* 23  CREATININE 2.16* 2.23* 1.41* 1.17*  CALCIUM  10.2 9.7 9.2 8.9   GFR: Estimated Creatinine Clearance: 43.4 mL/min (A) (by C-G formula based on SCr of 1.17 mg/dL (H)). Liver Function Tests: Recent Labs  Lab 02/27/24 1340  AST 23  ALT 21  ALKPHOS 58  BILITOT 0.9  PROT 7.4  ALBUMIN  3.8   No results for input(s): LIPASE, AMYLASE in the last 168 hours. No results for input(s): AMMONIA in the last 168 hours. Coagulation Profile: No results for input(s): INR, PROTIME in the last 168 hours. Cardiac Enzymes: No results for input(s): CKTOTAL, CKMB, CKMBINDEX, TROPONINI in the last 168 hours. BNP (last 3 results) No results for input(s): PROBNP in the last 8760 hours. HbA1C: No results for input(s): HGBA1C in the last 72 hours. CBG: No results for input(s): GLUCAP in the last 168 hours. Lipid Profile: No results for input(s): CHOL, HDL, LDLCALC, TRIG, CHOLHDL, LDLDIRECT in the last 72 hours. Thyroid  Function Tests: No results for input(s): TSH, T4TOTAL, FREET4, T3FREE, THYROIDAB in the last 72 hours. Anemia Panel: No results for input(s): VITAMINB12, FOLATE, FERRITIN, TIBC, IRON , RETICCTPCT in the last 72 hours. Sepsis Labs: No results for input(s): PROCALCITON, LATICACIDVEN in the last 168 hours.  Recent Results (from the past 240 hours)  Urine Culture (for pregnant, neutropenic or urologic patients or patients with an indwelling urinary catheter)     Status: Abnormal   Collection Time: 02/27/24  3:29 PM   Specimen: Urine, Clean Catch  Result Value Ref Range Status   Specimen Description URINE, CLEAN CATCH  Final   Special Requests NONE  Final   Culture (A)  Final    >=100,000 COLONIES/mL ESCHERICHIA COLI Two isolates with different morphologies were identified as the same organism.The most resistant  organism was reported. Performed at [facility], [address] [place], [place] [address]    Report Status 03/01/2024 FINAL  Final   Organism ID, Bacteria ESCHERICHIA COLI (A)  Final      Susceptibility   Escherichia coli - MIC*    AMPICILLIN >=32 RESISTANT Resistant     CEFAZOLIN  <=4 SENSITIVE Sensitive     CEFEPIME <=0.12 SENSITIVE Sensitive     CEFTRIAXONE  <=0.25 SENSITIVE Sensitive     CIPROFLOXACIN <=0.25 SENSITIVE Sensitive     GENTAMICIN <=1 SENSITIVE Sensitive     IMIPENEM <=0.25 SENSITIVE Sensitive     NITROFURANTOIN  <=16 SENSITIVE Sensitive     TRIMETH/SULFA <=20 SENSITIVE Sensitive     AMPICILLIN/SULBACTAM >=32 RESISTANT Resistant     PIP/TAZO <=4 SENSITIVE Sensitive ug/mL    * >=100,000 COLONIES/mL ESCHERICHIA COLI         Radiology Studies: No results found.       Scheduled Meds:  amLODipine   5 mg Oral Daily   apixaban   5 mg Oral BID   budesonide -glycopyrrolate -formoterol   2 puff Inhalation BID   cephALEXin   500 mg Oral Q8H  dorzolamide   1 drop Left Eye BID   doxazosin   2 mg Oral Daily   fesoterodine   4 mg Oral Daily   pantoprazole   40 mg Oral Daily   sodium chloride  flush  3 mL Intravenous Q12H   thiamine   100 mg Oral Daily   traZODone   50 mg Oral QHS   Continuous Infusions:     LOS: 3 days    Time spent: 40 minutes    Renato Applebaum, MD [facility]

## 2024-03-02 NOTE — Plan of Care (Signed)

## 2024-03-03 DIAGNOSIS — N179 Acute kidney failure, unspecified: Secondary | ICD-10-CM | POA: Diagnosis not present

## 2024-03-03 DIAGNOSIS — N189 Chronic kidney disease, unspecified: Secondary | ICD-10-CM | POA: Diagnosis not present

## 2024-03-03 MED ORDER — GERHARDT'S BUTT CREAM
TOPICAL_CREAM | Freq: Two times a day (BID) | CUTANEOUS | Status: DC
  Filled 2024-03-03: qty 60

## 2024-03-03 NOTE — Progress Notes (Signed)
 Mobility Specialist: Progress Note   03/03/24 1423  Mobility  Activity Ambulated with assistance in hallway  Level of Assistance Contact guard assist, steadying assist  Assistive Device Front wheel walker  Distance Ambulated (ft) 150 ft  Activity Response Tolerated well  Mobility Referral Yes  Mobility visit 1 Mobility  Mobility Specialist Start Time (ACUTE ONLY) 1120  Mobility Specialist Stop Time (ACUTE ONLY) 1135  Mobility Specialist Time Calculation (min) (ACUTE ONLY) 15 min    Pt received in bed, agreeable to mobility session. CGA throughout ambulation. No complaints. Returned to room without fault. Agreed to sit in chair for awhile. Left in chair with all needs met, call bell in reach.   Ileana Lute Mobility Specialist Please contact via SecureChat or Rehab office at [phone]

## 2024-03-03 NOTE — Plan of Care (Signed)

## 2024-03-03 NOTE — Progress Notes (Signed)
 PROGRESS NOTE    Virginia Walters  FMW:[id number] DOB: 1940/11/22 DOA: 02/27/2024 PCP: Duanne Butler DASEN, MD    Brief Narrative:  Patient is 83 year old female with history of hypertension, hyperlipidemia, COPD, heart failure with preserved ejection fraction, known ejection fraction of 60%, pulmonary embolism status post thrombectomy on Eliquis , recent alcohol use disorder, chronic back pain, neuropathy and prior GI bleeds presented with fall.  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  Frequent admission with dehydration.  Apparently, she was ready to work with physical therapy and took long time to come to the door , while at the door she got dizzy and fell.  Reportedly she had a blood pressure of 88 systolic on EMS arrival. Physical therapist visiting her at home called EMS to pick her up. At the emergency room, afebrile.  Blood pressure 107/31.  BUN 25, creatinine 2.16, alcohol level undetectable.  Chest x-ray normal.  Patient was given IV fluids, urine cultures collected and admitted with IV fluids and antibiotics.  Subjective: Seen and examined.  Sleepy.  Denies any complaints.  Assessment & Plan:   AKI on CKD stage IIIa: Baseline creatinine about 1.1.  Presented with creatinine of 2.16.  Probably prerenal with ongoing diarrhea, use of antihypertensive medications including lisinopril . Appropriately improving.  Urine output is adequate.   Now maintaining on oral nutrition.  Acute on chronic diarrhea: Likely noninfective.  No diarrhea since admission.  Discontinue precautions.  Symptomatic management.  Hypokalemia: Replaced.  Adequate.  Syncope and collapse, suspect vasovagal or orthostatics: Discontinue lisinopril .  Monitor orthostatic symptoms.  History of PE on chronic anticoagulation: Continue Eliquis .  No evidence of bleeding.  UTI present on admission: Urine culture growing E. coli.  Rocephin  day 4.   Will change  to oral antibiotics to complete 7 days of therapy.  Essential hypertension: Blood pressure improving now and elevated.  Lisinopril  discontinued.  Started on amlodipine .  Chronic medical issues including Diastolic congestive heart failure, euvolemic today.   COPD, stable.  On Breztri  and albuterol  nebs. GERD, on Protonix .   DVT prophylaxis:  apixaban  (ELIQUIS ) tablet 5 mg   Code Status: Full code Family Communication: Daughter on the phone called and updated 7/19. Disposition Plan: Status is: Inpatient.  Remains inpatient.  Medically stable.  Needs SNF bed.   Consultants:  None  Procedures:  None  Antimicrobials:  Rocephin  7/15--7/19 Keflex  7/19--     Objective: Vitals:   03/02/24 2126 03/03/24 0538 03/03/24 0802 03/03/24 0856  BP: (!) 154/53 (!) 172/67 (!) 147/57   Pulse: 69 63 63 64  Resp: 18 20 18 17   Temp: 98.2 F (36.8 C) 98.2 F (36.8 C) 97.7 F (36.5 C)   TempSrc: Oral Oral Oral   SpO2: 94% 98% 98% 98%  Weight:      Height:        Intake/Output Summary (Last 24 hours) at 03/03/2024 0931 Last data filed at 03/03/2024 0803 Gross per 24 hour  Intake 240 ml  Output 2000 ml  Net -1760 ml   Filed Weights   03/01/24 0339  Weight: 93.1 kg    Examination:  General exam: Looks comfortable. Respiratory system: Clear to auscultation. Respiratory effort normal. Cardiovascular system: S1 & S2 heard, RRR. No JVD, murmurs, rubs, gallops or clicks. No pedal edema. Gastrointestinal system: Soft and nontender.  Obese and pendulous.   Central nervous system: Alert awake and mostly oriented.  She has some generalized weakness.  No focal deficits.  Data Reviewed: I have personally reviewed following labs and imaging studies  CBC: Recent Labs  Lab 02/27/24 1340 02/28/24 0824  WBC 8.9 7.2  NEUTROABS 7.3  --   HGB 14.5 14.2  HCT 44.3 43.7  MCV 88.1 88.3  PLT 204 159   Basic Metabolic Panel: Recent Labs  Lab 02/27/24 1340 02/28/24 0824 02/29/24 0522  03/01/24 0416  NA 141 141 138 139  K 3.9 3.5 3.2* 4.1  CL 107 105 107 112*  CO2 22 20* 21* 20*  GLUCOSE 120* 80 85 76  BUN 25* 29* 25* 23  CREATININE 2.16* 2.23* 1.41* 1.17*  CALCIUM  10.2 9.7 9.2 8.9   GFR: Estimated Creatinine Clearance: 43.4 mL/min (A) (by C-G formula based on SCr of 1.17 mg/dL (H)). Liver Function Tests: Recent Labs  Lab 02/27/24 1340  AST 23  ALT 21  ALKPHOS 58  BILITOT 0.9  PROT 7.4  ALBUMIN  3.8   No results for input(s): LIPASE, AMYLASE in the last 168 hours. No results for input(s): AMMONIA in the last 168 hours. Coagulation Profile: No results for input(s): INR, PROTIME in the last 168 hours. Cardiac Enzymes: No results for input(s): CKTOTAL, CKMB, CKMBINDEX, TROPONINI in the last 168 hours. BNP (last 3 results) No results for input(s): PROBNP in the last 8760 hours. HbA1C: No results for input(s): HGBA1C in the last 72 hours. CBG: No results for input(s): GLUCAP in the last 168 hours. Lipid Profile: No results for input(s): CHOL, HDL, LDLCALC, TRIG, CHOLHDL, LDLDIRECT in the last 72 hours. Thyroid  Function Tests: No results for input(s): TSH, T4TOTAL, FREET4, T3FREE, THYROIDAB in the last 72 hours. Anemia Panel: No results for input(s): VITAMINB12, FOLATE, FERRITIN, TIBC, IRON , RETICCTPCT in the last 72 hours. Sepsis Labs: No results for input(s): PROCALCITON, LATICACIDVEN in the last 168 hours.  Recent Results (from the past 240 hours)  Urine Culture (for pregnant, neutropenic or urologic patients or patients with an indwelling urinary catheter)     Status: Abnormal   Collection Time: 02/27/24  3:29 PM   Specimen: Urine, Clean Catch  Result Value Ref Range Status   Specimen Description URINE, CLEAN CATCH  Final   Special Requests NONE  Final   Culture (A)  Final    >=100,000 COLONIES/mL ESCHERICHIA COLI Two isolates with different morphologies were identified as the same  organism.The most resistant organism was reported. Performed at [facility], [address] [place], [place] [address]    Report Status 03/01/2024 FINAL  Final   Organism ID, Bacteria ESCHERICHIA COLI (A)  Final      Susceptibility   Escherichia coli - MIC*    AMPICILLIN >=32 RESISTANT Resistant     CEFAZOLIN  <=4 SENSITIVE Sensitive     CEFEPIME <=0.12 SENSITIVE Sensitive     CEFTRIAXONE  <=0.25 SENSITIVE Sensitive     CIPROFLOXACIN <=0.25 SENSITIVE Sensitive     GENTAMICIN <=1 SENSITIVE Sensitive     IMIPENEM <=0.25 SENSITIVE Sensitive     NITROFURANTOIN  <=16 SENSITIVE Sensitive     TRIMETH/SULFA <=20 SENSITIVE Sensitive     AMPICILLIN/SULBACTAM >=32 RESISTANT Resistant     PIP/TAZO <=4 SENSITIVE Sensitive ug/mL    * >=100,000 COLONIES/mL ESCHERICHIA COLI         Radiology Studies: No results found.       Scheduled Meds:  amLODipine   5 mg Oral Daily   apixaban   5 mg Oral BID   budesonide -glycopyrrolate -formoterol   2 puff Inhalation BID   dorzolamide   1 drop Left Eye BID  doxazosin   2 mg Oral Daily   fesoterodine   4 mg Oral Daily   Gerhardt's butt cream   Topical BID   pantoprazole   40 mg Oral Daily   sodium chloride  flush  3 mL Intravenous Q12H   thiamine   100 mg Oral Daily   traZODone   50 mg Oral QHS   Continuous Infusions:     LOS: 4 days    Time spent: 35 minutes    Renato Applebaum, MD [facility]

## 2024-03-04 DIAGNOSIS — N189 Chronic kidney disease, unspecified: Secondary | ICD-10-CM | POA: Diagnosis not present

## 2024-03-04 DIAGNOSIS — N179 Acute kidney failure, unspecified: Secondary | ICD-10-CM | POA: Diagnosis not present

## 2024-03-04 LAB — GLUCOSE, CAPILLARY: Glucose-Capillary: 91 mg/dL (ref 70–99)

## 2024-03-04 NOTE — Plan of Care (Signed)

## 2024-03-04 NOTE — TOC Progression Note (Addendum)
 Transition of Care [facility]) - Progression Note    Patient Details  Name: Virginia Walters MRN: [id number] Date of Birth: 03-19-1941  Transition of Care [facility]) CM/SW Contact  Luann SHAUNNA [place], [place] Phone Number: 03/04/2024, 3:05 PM  Clinical Narrative:     [facility] explained that pt has used 18 medicare SNF days and will be in copay days soon. They would require 2 weeks of copay days up front which would come out to be $2842. C  CSW met with pt and explained this; she does not want [facility]. CSW explained other facilities may not require up front fee but would rather bill copay days. Provided alternative SNF offers with the medicare star ratings. Pt explains she plans on discussing with family member, [name], regarding SNF options.   CSW called and left voicemail for pt's daughter as well as [name] requesting return call.   1530: CSW spoke with [name] over the phone. She explained that first choice would be for [facility](they did not offer a bed) and 2nd choice would be [facility] though she explained that pt's daughter would have to make this decision as she is the individual with access to pt's finances. Dolly plans to call pt's daughter to discuss options.   Expected Discharge Plan: Skilled Nursing Facility Barriers to Discharge: English as a second language teacher, SNF Pending bed offer  Expected Discharge Plan and Services In-house Referral: Clinical Social Work   Post Acute Care Choice: Skilled Nursing Facility Living arrangements for the past 2 months: Single Family Home                                       Social Determinants of Health (SDOH) Interventions SDOH Screenings   Food Insecurity: No Food Insecurity (02/28/2024)  Housing: Low Risk  (02/28/2024)  Transportation Needs: No Transportation Needs (02/28/2024)  Utilities: Not At Risk (02/28/2024)  Alcohol Screen: Low Risk  (05/25/2022)  Depression (PHQ2-9): Low Risk  (02/23/2024)  Financial Resource Strain: Low Risk   (05/25/2022)  Physical Activity: Inactive (05/25/2022)  Social Connections: Unknown (02/28/2024)  Stress: No Stress Concern Present (05/25/2022)  Tobacco Use: Medium Risk (02/27/2024)    Readmission Risk Interventions    09/20/2021   12:40 PM  Readmission Risk Prevention Plan  Transportation Screening Complete  PCP or Specialist Appt within 5-7 Days Complete  Home Care Screening Complete  Medication Review (RN CM) Complete

## 2024-03-04 NOTE — Progress Notes (Signed)
 Occupational Therapy Treatment Patient Details Name: Virginia Walters MRN: [id number] DOB: Jul 29, 1941 Today's Date: 03/04/2024   History of present illness Pt is an 83 y.o. female presenting to hospital 12/01/23 after being found down in her yard by EMS after possible syncopal episode; incontinent of stool and urine; pt was doing colonoscopy prep.  Pt admitted with UTI, possible syncope, acute metabolic encephalopathy, AKI, symptomatic anemia.  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  Recent hospitalization 4/1 to 11/17/23 with symptomatic anemia requiring 3 units PRBC's complicated by acute encephalopathy.   OT comments  Patient received in supine and agreeable to OT treatment but did not want to finish session in recliner. Patient able to get to EOB with CGA and CGA to ambulate to sink for self care. Patient able to perform grooming tasks standing at sink with CGA for safety and min assist for peri area bathing and changing mesh underwear. Transfer training performed to simulate toilet transfers with CGA before returning to supine. Discharge recommendations continue to be appropriate. Acute OT to continue to follow to address established goals.       If plan is discharge home, recommend the following:  A little help with walking and/or transfers;A little help with bathing/dressing/bathroom;Assistance with cooking/housework;Direct supervision/assist for medications management   Equipment Recommendations  None recommended by OT    Recommendations for Other Services      Precautions / Restrictions Precautions Precautions: Fall Recall of Precautions/Restrictions: Impaired Restrictions Weight Bearing Restrictions Per Provider Order: No       Mobility Bed Mobility Overal bed mobility: Needs Assistance Bed Mobility: Supine to Sit, Sit to Supine     Supine to sit: Contact guard,  HOB elevated, Used rails Sit to supine: Supervision   General bed mobility comments: increased time and CGA with trunk, patient able to return to supine with supervision    Transfers Overall transfer level: Needs assistance Equipment used: Rolling walker (2 wheels) Transfers: Sit to/from Stand, Bed to chair/wheelchair/BSC Sit to Stand: Contact guard assist     Step pivot transfers: Contact guard assist     General transfer comment: cues for hand placement and CGA to power up and for safety     Balance Overall balance assessment: Needs assistance Sitting-balance support: No upper extremity supported, Feet unsupported, Bilateral upper extremity supported, Single extremity supported Sitting balance-Leahy Scale: Fair     Standing balance support: No upper extremity supported, During functional activity, Single extremity supported, Bilateral upper extremity supported Standing balance-Leahy Scale: Poor Standing balance comment: able to stand at sink for self care tasks with no UE support, benefits from UE support with dynamic standing                           ADL either performed or assessed with clinical judgement   ADL Overall ADL's : Needs assistance/impaired     Grooming: Wash/dry hands;Wash/dry face;Oral care;Brushing hair;Contact guard assist;Standing Grooming Details (indicate cue type and reason): at sink     Lower Body Bathing: Minimal assistance;Sit to/from stand Lower Body Bathing Details (indicate cue type and reason): peri area bathing Upper Body Dressing : Set up;Sitting Upper Body Dressing Details (indicate cue type and reason): change gown Lower Body Dressing: Minimal assistance;Sit to/from stand Lower Body Dressing Details (indicate cue type and reason): to change mesh underwear Toilet Transfer: Contact guard assist;Ambulation Toilet Transfer Details (indicate cue  type and reason): simulated           General ADL Comments: unaware she was in  wet bed    Extremity/Trunk Assessment              Vision       Perception     Praxis     Communication Communication Communication: No apparent difficulties   Cognition Arousal: Alert Behavior During Therapy: WFL for tasks assessed/performed Cognition: Cognition impaired   Orientation impairments: Situation, Place (believed she was in [place]) Awareness: Intellectual awareness intact, Online awareness impaired Memory impairment (select all impairments): Short-term memory Attention impairment (select first level of impairment): Sustained attention Executive functioning impairment (select all impairments): Problem solving                   Following commands: Impaired Following commands impaired: Follows one step commands inconsistently      Cueing   Cueing Techniques: Verbal cues, Gestural cues  Exercises      Shoulder Instructions       General Comments      Pertinent Vitals/ Pain       Pain Assessment Pain Assessment: Faces Faces Pain Scale: Hurts a little bit Pain Location: back Pain Descriptors / Indicators: Sore Pain Intervention(s): Monitored during session, Repositioned  Home Living                                          Prior Functioning/Environment              Frequency  Min 2X/week        Progress Toward Goals  OT Goals(current goals can now be found in the care plan section)  Progress towards OT goals: Progressing toward goals  Acute Rehab OT Goals Patient Stated Goal: to go home OT Goal Formulation: With patient Time For Goal Achievement: 03/13/24 Potential to Achieve Goals: Good ADL Goals Pt Will Perform Grooming: with modified independence;standing Pt Will Perform Lower Body Dressing: with modified independence;sit to/from stand Pt Will Transfer to Toilet: with modified independence;ambulating;regular height toilet Pt Will Perform Toileting - Clothing Manipulation and hygiene: with  modified independence;sitting/lateral leans;sit to/from stand Additional ADL Goal #1: Pt will complete pillbox test with 5/5 accuracy to promote safety with med management in home environment  Plan      Co-evaluation                 AM-PAC OT 6 Clicks Daily Activity     Outcome Measure   Help from another person eating meals?: None Help from another person taking care of personal grooming?: A Little Help from another person toileting, which includes using toliet, bedpan, or urinal?: A Little Help from another person bathing (including washing, rinsing, drying)?: A Little Help from another person to put on and taking off regular upper body clothing?: A Little Help from another person to put on and taking off regular lower body clothing?: A Little 6 Click Score: 19    End of Session Equipment Utilized During Treatment: Gait belt;Rolling walker (2 wheels)  OT Visit Diagnosis: Unsteadiness on feet (R26.81);Other abnormalities of gait and mobility (R26.89);History of falling (Z91.81);Muscle weakness (generalized) (M62.81)   Activity Tolerance Patient tolerated treatment well   Patient Left in bed;with call bell/phone within reach;with bed alarm set   Nurse Communication Mobility status        Time: 9155-9086 OT Time Calculation (  min): 29 min  Charges: OT General Charges $OT Visit: 1 Visit OT Treatments $Self Care/Home Management : 23-37 mins  Dick Laine, OTA Acute Rehabilitation Services  Office [phone]   Jeb LITTIE Laine 03/04/2024, 10:21 AM

## 2024-03-04 NOTE — Progress Notes (Signed)
 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  Frequent admission with dehydration.  Apparently, she was ready to work with physical therapy and took long time to come to the door , while at the door she got dizzy and fell.  Reportedly she had a blood pressure of 88 systolic on EMS arrival. Physical therapist visiting her at home called EMS to pick her up. At the emergency room, afebrile.  Blood pressure 107/31.  BUN 25, creatinine 2.16, alcohol level undetectable.  Chest x-ray normal.  Patient was given IV fluids, urine cultures collected and admitted with IV fluids and antibiotics.  Subjective: Patient seen and examined.  No overnight events.  Remains stable.  You also want to send me to so called rehab like my daughter always talks about   Assessment & Plan:   AKI on CKD stage IIIa: Baseline creatinine about 1.1.  Presented with creatinine of 2.16.  Probably prerenal with ongoing diarrhea, use of antihypertensive medications including lisinopril . Appropriately improving.  Urine output is adequate.   Now maintaining on oral nutrition.  Acute on chronic diarrhea: Likely noninfective.  No diarrhea since admission.  Discontinue precautions.  Symptomatic management.  Hypokalemia: Replaced.  Adequate.  Syncope and collapse, suspect vasovagal or orthostatics: Discontinue lisinopril .  Monitor orthostatic symptoms.  History of PE on chronic anticoagulation: Continue Eliquis .  No evidence of  bleeding.  UTI present on admission: Urine culture growing E. coli.  Rocephin  day 4.  change to oral antibiotics to complete 7 days of therapy.  Essential hypertension: Blood pressure improving now and elevated.  Lisinopril  discontinued.  Started on amlodipine .  Chronic medical issues including Diastolic congestive heart failure, euvolemic today.   COPD, stable.  On Breztri  and albuterol  nebs. GERD, on Protonix .  MedSurg status.  Stable to transfer to SNF when bed available.   DVT prophylaxis:  apixaban  (ELIQUIS ) tablet 5 mg   Code Status: Full code Family Communication: None today. Disposition Plan: Status is: Medically stable.  Needs SNF bed.   Consultants:  None  Procedures:  None  Antimicrobials:  Rocephin  7/15--7/19 Keflex  7/19--     Objective: Vitals:   03/03/24 2024 03/04/24 0400 03/04/24 0500 03/04/24 0714  BP: (!) 142/67 (!) 142/56  (!) 159/71  Pulse: 76 64  75  Resp: 16 18  18   Temp: 98.5 F (36.9 C) 98.9 F (37.2 C)  98 F (36.7 C)  TempSrc: Oral Oral  Oral  SpO2: 94% 94%  97%  Weight:   92.8 kg   Height:        Intake/Output Summary (Last 24 hours) at 03/04/2024 1059 Last data filed at 03/03/2024 1845 Gross per 24 hour  Intake 600 ml  Output 600 ml  Net 0 ml   Filed Weights   03/01/24 0339 03/04/24 0500  Weight: 93.1 kg 92.8 kg    Examination:  General exam: Fairly comfortable.  Interactive.  Flat affect. Respiratory system: Clear to auscultation. Respiratory effort normal. Cardiovascular system: S1 & S2 heard, RRR. No JVD, murmurs, rubs, gallops or clicks. No pedal edema. Gastrointestinal  system: Soft and nontender.  Obese and pendulous.   Central nervous system: Alert awake and mostly oriented.  She has some generalized weakness.  No focal deficits.    Data Reviewed: I have personally reviewed following labs and imaging studies  CBC: Recent Labs  Lab 02/27/24 1340 02/28/24 0824  WBC 8.9 7.2  NEUTROABS 7.3  --   HGB 14.5  14.2  HCT 44.3 43.7  MCV 88.1 88.3  PLT 204 159   Basic Metabolic Panel: Recent Labs  Lab 02/27/24 1340 02/28/24 0824 02/29/24 0522 03/01/24 0416  NA 141 141 138 139  K 3.9 3.5 3.2* 4.1  CL 107 105 107 112*  CO2 22 20* 21* 20*  GLUCOSE 120* 80 85 76  BUN 25* 29* 25* 23  CREATININE 2.16* 2.23* 1.41* 1.17*  CALCIUM  10.2 9.7 9.2 8.9   GFR: Estimated Creatinine Clearance: 43.4 mL/min (A) (by C-G formula based on SCr of 1.17 mg/dL (H)). Liver Function Tests: Recent Labs  Lab 02/27/24 1340  AST 23  ALT 21  ALKPHOS 58  BILITOT 0.9  PROT 7.4  ALBUMIN  3.8   No results for input(s): LIPASE, AMYLASE in the last 168 hours. No results for input(s): AMMONIA in the last 168 hours. Coagulation Profile: No results for input(s): INR, PROTIME in the last 168 hours. Cardiac Enzymes: No results for input(s): CKTOTAL, CKMB, CKMBINDEX, TROPONINI in the last 168 hours. BNP (last 3 results) No results for input(s): PROBNP in the last 8760 hours. HbA1C: No results for input(s): HGBA1C in the last 72 hours. CBG: No results for input(s): GLUCAP in the last 168 hours. Lipid Profile: No results for input(s): CHOL, HDL, LDLCALC, TRIG, CHOLHDL, LDLDIRECT in the last 72 hours. Thyroid  Function Tests: No results for input(s): TSH, T4TOTAL, FREET4, T3FREE, THYROIDAB in the last 72 hours. Anemia Panel: No results for input(s): VITAMINB12, FOLATE, FERRITIN, TIBC, IRON , RETICCTPCT in the last 72 hours. Sepsis Labs: No results for input(s): PROCALCITON, LATICACIDVEN in the last 168 hours.  Recent Results (from the past 240 hours)  Urine Culture (for pregnant, neutropenic or urologic patients or patients with an indwelling urinary catheter)     Status: Abnormal   Collection Time: 02/27/24  3:29 PM   Specimen: Urine, Clean Catch  Result Value Ref Range Status   Specimen Description URINE, CLEAN CATCH  Final   Special Requests NONE   Final   Culture (A)  Final    >=100,000 COLONIES/mL ESCHERICHIA COLI Two isolates with different morphologies were identified as the same organism.The most resistant organism was reported. Performed at [facility], [address] [place], [place] [address]    Report Status 03/01/2024 FINAL  Final   Organism ID, Bacteria ESCHERICHIA COLI (A)  Final      Susceptibility   Escherichia coli - MIC*    AMPICILLIN >=32 RESISTANT Resistant     CEFAZOLIN  <=4 SENSITIVE Sensitive     CEFEPIME <=0.12 SENSITIVE Sensitive     CEFTRIAXONE  <=0.25 SENSITIVE Sensitive     CIPROFLOXACIN <=0.25 SENSITIVE Sensitive     GENTAMICIN <=1 SENSITIVE Sensitive     IMIPENEM <=0.25 SENSITIVE Sensitive     NITROFURANTOIN  <=16 SENSITIVE Sensitive     TRIMETH/SULFA <=20 SENSITIVE Sensitive     AMPICILLIN/SULBACTAM >=32 RESISTANT Resistant     PIP/TAZO <=4 SENSITIVE Sensitive ug/mL    * >=100,000 COLONIES/mL ESCHERICHIA COLI         Radiology Studies: No results found.       Scheduled Meds:  amLODipine   5 mg Oral Daily   apixaban   5 mg Oral BID   budesonide -glycopyrrolate -formoterol   2 puff Inhalation BID   dorzolamide   1 drop Left Eye BID   doxazosin   2 mg Oral Daily   fesoterodine   4 mg Oral Daily   Gerhardt's butt cream   Topical BID   pantoprazole   40 mg Oral Daily   sodium chloride  flush  3 mL Intravenous Q12H   thiamine   100 mg Oral Daily   traZODone   50 mg Oral QHS   Continuous Infusions:     LOS: 5 days    Time spent: 35 minutes    Renato Applebaum, MD [facility]

## 2024-03-04 NOTE — TOC Progression Note (Signed)
 Transition of Care [facility]) - Progression Note    Patient Details  Name: Virginia Walters MRN: [id number] Date of Birth: 04-03-1941  Transition of Care [facility]) CM/SW Contact  Trenyce Loera SHAUNNA [place], [place] Phone Number: 03/04/2024, 12:00 PM  Clinical Narrative:     CSW contacted [facility] and requested they review pt's referral.   Expected Discharge Plan: Skilled Nursing Facility Barriers to Discharge: Continued Medical Work up, English as a second language teacher, SNF Pending bed offer  Expected Discharge Plan and Services In-house Referral: Clinical Social Work   Post Acute Care Choice: Skilled Nursing Facility Living arrangements for the past 2 months: Single Family Home                                       Social Determinants of Health (SDOH) Interventions SDOH Screenings   Food Insecurity: No Food Insecurity (02/28/2024)  Housing: Low Risk  (02/28/2024)  Transportation Needs: No Transportation Needs (02/28/2024)  Utilities: Not At Risk (02/28/2024)  Alcohol Screen: Low Risk  (05/25/2022)  Depression (PHQ2-9): Low Risk  (02/23/2024)  Financial Resource Strain: Low Risk  (05/25/2022)  Physical Activity: Inactive (05/25/2022)  Social Connections: Unknown (02/28/2024)  Stress: No Stress Concern Present (05/25/2022)  Tobacco Use: Medium Risk (02/27/2024)    Readmission Risk Interventions    09/20/2021   12:40 PM  Readmission Risk Prevention Plan  Transportation Screening Complete  PCP or Specialist Appt within 5-7 Days Complete  Home Care Screening Complete  Medication Review (RN CM) Complete

## 2024-03-04 NOTE — Progress Notes (Signed)
 Physical Therapy Treatment Patient Details Name: Virginia Walters MRN: [id number] DOB: 1941/07/23 Today's Date: 03/04/2024   History of Present Illness Pt is an 83 y.o. female presenting to hospital 12/01/23 after being found down in her yard by EMS after possible syncopal episode; incontinent of stool and urine; pt was doing colonoscopy prep.  Pt admitted with UTI, possible syncope, acute metabolic encephalopathy, AKI, symptomatic anemia.  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  Recent hospitalization 4/1 to 11/17/23 with symptomatic anemia requiring 3 units PRBC's complicated by acute encephalopathy.    PT Comments  Continuing work on functional mobility and activity tolerance;  Session focused on progressive amb, and looking closely at problem solving as well; Noting good progress with activity tolerance, and pt was able to ambulate in the hallway with RW; She had trouble gauging her fatigue, though, and needed a chair to be brought to her to make it safely back to her room; Overall progressing well; Anticipate continuing good progress at post-acute rehabilitation.    If plan is discharge home, recommend the following: A little help with walking and/or transfers;Assistance with cooking/housework;Assist for transportation;Help with stairs or ramp for entrance;Supervision due to cognitive status   Can travel by private vehicle     Yes  Equipment Recommendations  None recommended by PT    Recommendations for Other Services       Precautions / Restrictions Precautions Precautions: Fall Recall of Precautions/Restrictions: Impaired Precaution/Restrictions Comments: Poor self-monitor for activity tolerance Restrictions Weight Bearing Restrictions Per Provider Order: No     Mobility  Bed Mobility Overal bed mobility: Needs Assistance Bed Mobility: Supine to Sit, Sit to Supine      Supine to sit: Contact guard, HOB elevated, Used rails Sit to supine: Min assist   General bed mobility comments: increased time and CGA with trunk, patient able to return to supine with increased time and lots of cues for positioning, and min assist to help LEs back into bed    Transfers Overall transfer level: Needs assistance Equipment used: Rolling walker (2 wheels) Transfers: Sit to/from Stand, Bed to chair/wheelchair/BSC Sit to Stand: Contact guard assist   Step pivot transfers: Contact guard assist       General transfer comment: cues for hand placement and CGA to power up and for safety    Ambulation/Gait Ambulation/Gait assistance: Min assist Gait Distance (Feet): 100 Feet Assistive device: Rolling walker (2 wheels) Gait Pattern/deviations: Step-through pattern, Decreased stride length, Wide base of support       General Gait Details: Short unsteady steps with wide BOS and flat foot initial contact; PT frequently checked in with pt re: activity tolerance, with cues to make sure she had enough energy to make it back to her room; as she showed evidence of fatigue, she did not report the need to sit down and rest, however it became apparent she needed a wheelchair to get back to her room   Stairs             Wheelchair Mobility     Tilt Bed    Modified Rankin (Stroke Patients Only)       Balance Overall balance assessment: Needs assistance Sitting-balance support: No upper extremity supported, Feet unsupported, Bilateral upper extremity supported, Single extremity supported Sitting balance-Leahy Scale: Fair     Standing balance support: No upper extremity supported, During functional activity, Single extremity supported, Bilateral upper extremity supported  Standing balance-Leahy Scale: Poor Standing balance comment: able to stand at sink for self care tasks with no UE support, benefits from UE support with dynamic standing                             Communication Communication Communication: No apparent difficulties  Cognition Arousal: Alert Behavior During Therapy: WFL for tasks assessed/performed   PT - Cognitive impairments: Problem solving, Safety/Judgement, Awareness, Memory, Attention, Orientation   Orientation impairments: Situation                   PT - Cognition Comments: More oriented to her situation; noting incr time to follow commands; difficutly problem-solving Following commands: Impaired Following commands impaired: Follows one step commands inconsistently    Cueing Cueing Techniques: Verbal cues, Gestural cues  Exercises      General Comments General comments (skin integrity, edema, etc.): We discussed post-acute rehab to get stronger and better endurance to make getting home safer      Pertinent Vitals/Pain Pain Assessment Pain Assessment: Faces Faces Pain Scale: Hurts a little bit Pain Location: back Pain Descriptors / Indicators: Sore    Home Living                          Prior Function            PT Goals (current goals can now be found in the care plan section) Acute Rehab PT Goals Patient Stated Goal: Wants to go home PT Goal Formulation: With patient Time For Goal Achievement: 03/13/24 Potential to Achieve Goals: Fair Progress towards PT goals: Progressing toward goals    Frequency    Min 2X/week      PT Plan      Co-evaluation              AM-PAC PT 6 Clicks Mobility   Outcome Measure  Help needed turning from your back to your side while in a flat bed without using bedrails?: A Little Help needed moving from lying on your back to sitting on the side of a flat bed without using bedrails?: A Little Help needed moving to and from a bed to a chair (including a wheelchair)?: A Little Help needed standing up from a chair using your arms (e.g., wheelchair or bedside chair)?: A Little Help needed to walk in hospital room?: A Little Help  needed climbing 3-5 steps with a railing? : A Lot 6 Click Score: 17    End of Session Equipment Utilized During Treatment: Gait belt Activity Tolerance: Patient tolerated treatment well Patient left: in bed;with call bell/phone within reach;with bed alarm set Nurse Communication: Mobility status PT Visit Diagnosis: Unsteadiness on feet (R26.81);Muscle weakness (generalized) (M62.81);Difficulty in walking, not elsewhere classified (R26.2)     Time: 8791-8762 PT Time Calculation (min) (ACUTE ONLY): 29 min  Charges:    $Gait Training: 23-37 mins PT General Charges $$ ACUTE PT VISIT: 1 Visit                     Silvano Currier, PT  Acute Rehabilitation Services Office [phone] Secure Chat welcomed    Silvano VEAR Currier 03/04/2024, 1:36 PM

## 2024-03-05 DIAGNOSIS — N189 Chronic kidney disease, unspecified: Secondary | ICD-10-CM | POA: Diagnosis not present

## 2024-03-05 DIAGNOSIS — N179 Acute kidney failure, unspecified: Secondary | ICD-10-CM | POA: Diagnosis not present

## 2024-03-05 NOTE — TOC Progression Note (Addendum)
 Transition of Care [facility]) - Progression Note    Patient Details  Name: Virginia Walters MRN: [id number] Date of Birth: Feb 04, 1941  [facility] CM/SW Contact  Luann SHAUNNA [place], [place] Phone Number: 03/05/2024, 9:21 AM  Clinical Narrative:     CSW called pt's daughter and provided update. Explained [facility] would require 2 days upfront copay day payment. Daughter would like to try for SNF that wouldn't require upfront payment of copay days. Second choice would be [facility]. CSW contacted [facility] to inquire about their copay policy; awaiting response.   1200: [facility] also requires 2 weeks copay up front. CSW called pt's daughter and updated her; discussed potential other options. Daughter would like to move forward with [facility] though she will likely not be able to pay until tomorrow. CSW notified [facility]; they will call pt's daughter to discuss financial piece. They confirmed bed offer and CSW initiated SNF auth request in online portal.  Auth# [id number]  [facility] is pending.   Expected Discharge Plan: Skilled Nursing Facility Barriers to Discharge: English as a second language teacher, SNF Pending bed offer  Expected Discharge Plan and Services In-house Referral: Clinical Social Work   Post Acute Care Choice: Skilled Nursing Facility Living arrangements for the past 2 months: Single Family Home                                       Social Determinants of Health (SDOH) Interventions SDOH Screenings   Food Insecurity: No Food Insecurity (02/28/2024)  Housing: Low Risk  (02/28/2024)  Transportation Needs: No Transportation Needs (02/28/2024)  Utilities: Not At Risk (02/28/2024)  Alcohol Screen: Low Risk  (05/25/2022)  Depression (PHQ2-9): Low Risk  (02/23/2024)  Financial Resource Strain: Low Risk  (05/25/2022)  Physical Activity: Inactive (05/25/2022)  Social Connections: Unknown (02/28/2024)  Stress: No Stress Concern Present (05/25/2022)   Tobacco Use: Medium Risk (02/27/2024)    Readmission Risk Interventions    09/20/2021   12:40 PM  Readmission Risk Prevention Plan  Transportation Screening Complete  PCP or Specialist Appt within 5-7 Days Complete  Home Care Screening Complete  Medication Review (RN CM) Complete

## 2024-03-05 NOTE — Progress Notes (Signed)
 PROGRESS NOTE    Virginia Walters  FMW:[id number] DOB: 1940-12-06 DOA: 02/27/2024 PCP: Duanne Butler DASEN, MD    Brief Narrative:  Patient is 83 year old female with history of hypertension, hyperlipidemia, COPD, heart failure with preserved ejection fraction, known ejection fraction of 60%, pulmonary embolism status post thrombectomy on Eliquis , recent alcohol use disorder, chronic back pain, neuropathy and prior GI bleeds presented with fall.  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  Frequent admission with dehydration.  Apparently, she was ready to work with physical therapy and took long time to come to the door , while at the door she got dizzy and fell.  Reportedly she had a blood pressure of 88 systolic on EMS arrival. Physical therapist visiting her at home called EMS to pick her up. At the emergency room, afebrile.  Blood pressure 107/31.  BUN 25, creatinine 2.16, alcohol level undetectable.  Chest x-ray normal.  Patient was given IV fluids, urine cultures collected and admitted with IV fluids and antibiotics.  Subjective: Patient seen and examined.  Napping after breakfast in the morning rounds.  No new events.  Assessment & Plan:   AKI on CKD stage IIIa: Baseline creatinine about 1.1.  Presented with creatinine of 2.16.  Probably prerenal with ongoing diarrhea, use of antihypertensive medications including lisinopril . Appropriately improving.  Urine output is adequate.   Now maintaining on oral nutrition.  Acute on chronic diarrhea: Likely noninfective.  No diarrhea since admission.  Discontinue precautions.  Symptomatic management.  Hypokalemia: Replaced.  Adequate.  Syncope and collapse, suspect vasovagal or orthostatics: Discontinue lisinopril .  Monitor orthostatic symptoms.  History of PE on chronic anticoagulation: Continue Eliquis .  No evidence of bleeding.  UTI present on admission: Urine culture growing  E. coli.  Rocephin  day 4.  change to oral antibiotics to complete 7 days of therapy.  Essential hypertension: Blood pressure improving now and elevated.  Lisinopril  discontinued.  Started on amlodipine .  Chronic medical issues including Diastolic congestive heart failure, euvolemic today.   COPD, stable.  On Breztri  and albuterol  nebs. GERD, on Protonix .  MedSurg status.  Stable to transfer to SNF when bed available.   DVT prophylaxis:  apixaban  (ELIQUIS ) tablet 5 mg   Code Status: Full code Family Communication: None today. Disposition Plan: Status is: Medically stable.  Needs SNF bed.   Consultants:  None  Procedures:  None  Antimicrobials:  Rocephin  7/15--7/19 Keflex  7/19--     Objective: Vitals:   03/04/24 2135 03/05/24 0643 03/05/24 0723 03/05/24 0801  BP: (!) 147/73 (!) 164/71 (!) 147/62   Pulse: 77 66 71   Resp: 18 18 18    Temp: 97.9 F (36.6 C) 97.8 F (36.6 C) 98.8 F (37.1 C)   TempSrc: Oral Oral Oral   SpO2: 95% 100% 94% 94%  Weight:      Height:        Intake/Output Summary (Last 24 hours) at 03/05/2024 1120 Last data filed at 03/05/2024 0644 Gross per 24 hour  Intake 180 ml  Output 1050 ml  Net -870 ml   Filed Weights   03/01/24 0339 03/04/24 0500  Weight: 93.1 kg 92.8 kg    Examination:  General exam: Comfortably sleeping.  On room air. Respiratory system: Clear to auscultation. Respiratory effort normal. Cardiovascular system: S1 & S2 heard, RRR. No JVD, murmurs, rubs, gallops or clicks. No pedal edema. Gastrointestinal system: Soft and nontender.  Obese and pendulous.   Central nervous system: Alert awake  and mostly oriented.  She has some generalized weakness.  No focal deficits.    Data Reviewed: I have personally reviewed following labs and imaging studies  CBC: Recent Labs  Lab 02/27/24 1340 02/28/24 0824  WBC 8.9 7.2  NEUTROABS 7.3  --   HGB 14.5 14.2  HCT 44.3 43.7  MCV 88.1 88.3  PLT 204 159   Basic Metabolic  Panel: Recent Labs  Lab 02/27/24 1340 02/28/24 0824 02/29/24 0522 03/01/24 0416  NA 141 141 138 139  K 3.9 3.5 3.2* 4.1  CL 107 105 107 112*  CO2 22 20* 21* 20*  GLUCOSE 120* 80 85 76  BUN 25* 29* 25* 23  CREATININE 2.16* 2.23* 1.41* 1.17*  CALCIUM  10.2 9.7 9.2 8.9   GFR: Estimated Creatinine Clearance: 43.4 mL/min (A) (by C-G formula based on SCr of 1.17 mg/dL (H)). Liver Function Tests: Recent Labs  Lab 02/27/24 1340  AST 23  ALT 21  ALKPHOS 58  BILITOT 0.9  PROT 7.4  ALBUMIN  3.8   No results for input(s): LIPASE, AMYLASE in the last 168 hours. No results for input(s): AMMONIA in the last 168 hours. Coagulation Profile: No results for input(s): INR, PROTIME in the last 168 hours. Cardiac Enzymes: No results for input(s): CKTOTAL, CKMB, CKMBINDEX, TROPONINI in the last 168 hours. BNP (last 3 results) No results for input(s): PROBNP in the last 8760 hours. HbA1C: No results for input(s): HGBA1C in the last 72 hours. CBG: Recent Labs  Lab 03/04/24 2133  GLUCAP 91   Lipid Profile: No results for input(s): CHOL, HDL, LDLCALC, TRIG, CHOLHDL, LDLDIRECT in the last 72 hours. Thyroid  Function Tests: No results for input(s): TSH, T4TOTAL, FREET4, T3FREE, THYROIDAB in the last 72 hours. Anemia Panel: No results for input(s): VITAMINB12, FOLATE, FERRITIN, TIBC, IRON , RETICCTPCT in the last 72 hours. Sepsis Labs: No results for input(s): PROCALCITON, LATICACIDVEN in the last 168 hours.  Recent Results (from the past 240 hours)  Urine Culture (for pregnant, neutropenic or urologic patients or patients with an indwelling urinary catheter)     Status: Abnormal   Collection Time: 02/27/24  3:29 PM   Specimen: Urine, Clean Catch  Result Value Ref Range Status   Specimen Description URINE, CLEAN CATCH  Final   Special Requests NONE  Final   Culture (A)  Final    >=100,000 COLONIES/mL ESCHERICHIA COLI Two  isolates with different morphologies were identified as the same organism.The most resistant organism was reported. Performed at [facility], [address] [place], [place] [address]    Report Status 03/01/2024 FINAL  Final   Organism ID, Bacteria ESCHERICHIA COLI (A)  Final      Susceptibility   Escherichia coli - MIC*    AMPICILLIN >=32 RESISTANT Resistant     CEFAZOLIN  <=4 SENSITIVE Sensitive     CEFEPIME <=0.12 SENSITIVE Sensitive     CEFTRIAXONE  <=0.25 SENSITIVE Sensitive     CIPROFLOXACIN <=0.25 SENSITIVE Sensitive     GENTAMICIN <=1 SENSITIVE Sensitive     IMIPENEM <=0.25 SENSITIVE Sensitive     NITROFURANTOIN  <=16 SENSITIVE Sensitive     TRIMETH/SULFA <=20 SENSITIVE Sensitive     AMPICILLIN/SULBACTAM >=32 RESISTANT Resistant     PIP/TAZO <=4 SENSITIVE Sensitive ug/mL    * >=100,000 COLONIES/mL ESCHERICHIA COLI         Radiology Studies: No results found.       Scheduled Meds:  amLODipine   5 mg Oral Daily   apixaban   5 mg Oral BID  budesonide -glycopyrrolate -formoterol   2 puff Inhalation BID   dorzolamide   1 drop Left Eye BID   doxazosin   2 mg Oral Daily   fesoterodine   4 mg Oral Daily   Gerhardt's butt cream   Topical BID   pantoprazole   40 mg Oral Daily   sodium chloride  flush  3 mL Intravenous Q12H   thiamine   100 mg Oral Daily   traZODone   50 mg Oral QHS   Continuous Infusions:     LOS: 6 days    Time spent: 35 minutes    Renato Applebaum, MD [facility]

## 2024-03-05 NOTE — Plan of Care (Signed)
   Problem: Education: Goal: Knowledge of General Education information will improve Description: Including pain rating scale, medication(s)/side effects and non-pharmacologic comfort measures Outcome: Progressing   Problem: Nutrition: Goal: Adequate nutrition will be maintained Outcome: Progressing   Problem: Coping: Goal: Level of anxiety will decrease Outcome: Progressing

## 2024-03-06 DIAGNOSIS — N189 Chronic kidney disease, unspecified: Secondary | ICD-10-CM | POA: Diagnosis not present

## 2024-03-06 DIAGNOSIS — N179 Acute kidney failure, unspecified: Secondary | ICD-10-CM | POA: Diagnosis not present

## 2024-03-06 NOTE — TOC Progression Note (Addendum)
 Transition of Care [facility]) - Progression Note    Patient Details  Name: Virginia Walters MRN: [id number] Date of Birth: 28-Aug-1940  [facility]) CM/SW Contact  Luann SHAUNNA [place], [place] Phone Number: 03/06/2024, 9:02 AM  Clinical Narrative:     [facility] approved 7/23- 7/25.  Aut# [id number]  CSW notified [facility] that [facility] is approved and they informed CSW that there is no bed today. Admissions to notify CSW about bed availability tomorrow after their morning meeting.  [facility] still to  speak with daughter about finances.  1415: Confirmed with [facility] admissions that they can admit pt tomorrow. CSW updated pt's son in law.   Expected Discharge Plan: Skilled Nursing Facility Barriers to Discharge: No SNF bed               Expected Discharge Plan and Services In-house Referral: Clinical Social Work   Post Acute Care Choice: Skilled Nursing Facility Living arrangements for the past 2 months: Single Family Home                                       Social Drivers of Health (SDOH) Interventions SDOH Screenings   Food Insecurity: No Food Insecurity (02/28/2024)  Housing: Low Risk  (02/28/2024)  Transportation Needs: No Transportation Needs (02/28/2024)  Utilities: Not At Risk (02/28/2024)  Alcohol Screen: Low Risk  (05/25/2022)  Depression (PHQ2-9): Low Risk  (02/23/2024)  Financial Resource Strain: Low Risk  (05/25/2022)  Physical Activity: Inactive (05/25/2022)  Social Connections: Unknown (02/28/2024)  Stress: No Stress Concern Present (05/25/2022)  Tobacco Use: Medium Risk (02/27/2024)    Readmission Risk Interventions    09/20/2021   12:40 PM  Readmission Risk Prevention Plan  Transportation Screening Complete  PCP or Specialist Appt within 5-7 Days Complete  Home Care Screening Complete  Medication Review (RN CM) Complete

## 2024-03-06 NOTE — Discharge Summary (Signed)
 Physician Discharge Summary  Virginia Walters FMW:[id number] DOB: 02/19/1941 DOA: 02/27/2024  PCP: Duanne Butler DASEN, MD  Admit date: 02/27/2024 Discharge date: 03/07/2024  Admitted From: Home Disposition: Skilled nursing facility  Recommendations for Outpatient Follow-up:  Follow up with PCP in 1-2 weeks Please obtain BMP/CBC/magnesium  in one week   Discharge Condition: Stable CODE STATUS: Full code Diet recommendation: Low-salt diet, nutritional supplements  Discharge summary: Patient is 83 year old female with history of hypertension, hyperlipidemia, COPD, heart failure with preserved ejection fraction, known ejection fraction of 60%, pulmonary embolism status post thrombectomy on Eliquis , recent alcohol use disorder, chronic back pain, neuropathy and prior GI bleeds presented with fall.  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  Frequent admission with dehydration.  Apparently, she was ready to work with physical therapy and took long time to come to the door , while at the door she got dizzy and fell.  Reportedly she had a blood pressure of 88 systolic on EMS arrival. Physical therapist visiting her at home called EMS to pick her up. At the emergency room, afebrile.  Blood pressure 107/31.  BUN 25, creatinine 2.16, alcohol level undetectable.  Chest x-ray normal.  Patient was given IV fluids, urine cultures collected and admitted with IV fluids and antibiotics and she is stabilized.   Assessment & Plan of care:    AKI on CKD stage IIIa: Baseline creatinine about 1.1.  Presented with creatinine of 2.16.  Probably prerenal with ongoing diarrhea, use of antihypertensive medications including lisinopril . Appropriately improving.  Urine output is adequate.   Now maintaining on oral nutrition. Needs recheck in 1 week.   Acute on chronic diarrhea: Likely noninfective.  No diarrhea since admission.  No recurrence of symptoms in  the hospital stay.   Hypokalemia: Replaced.  Adequate.   Syncope and collapse, suspect vasovagal or orthostatics: Discontinue lisinopril .  Monitor orthostatic symptoms.  Negative orthostatics.   History of PE on chronic anticoagulation: Continue Eliquis .  No evidence of bleeding.   UTI present on admission: Urine culture with E. coli.  Completed 7 days of antibiotics.   Essential hypertension: Blood pressure improving now and elevated.  Lisinopril  discontinued.  Started on amlodipine  and doxazosin .   Chronic medical issues including Diastolic congestive heart failure, euvolemic now.  Does not need any diuretics. COPD, stable.  On Breztri  and albuterol  nebs. GERD, on Protonix .   Medically stable to transfer to a skilled nursing facility.   Discharge Diagnoses:  Principal Problem:   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, stage 3a (HCC) Active Problems:   Urinary tract infection   Essential hypertension   Diarrhea   Fall at home, initial encounter   Mild cognitive impairment   Chronic diastolic CHF (congestive heart failure) (HCC)   COPD (chronic obstructive pulmonary disease) (HCC)   Alcohol use disorder   GERD (gastroesophageal reflux disease)   Obesity (BMI 30-39.9)   Syncope and collapse    Discharge Instructions  Discharge Instructions     Diet - low sodium heart healthy   Complete by: As directed    Increase activity slowly   Complete by: As directed       Allergies as of 03/07/2024       Reactions   Ibuprofen     Oxycodone  Itching        Medication List     STOP taking these medications    lisinopril  40 MG tablet Commonly known as: ZESTRIL   TAKE these medications    acetaminophen  325 MG tablet Commonly known as: TYLENOL  Take 2 tablets (650 mg total) by mouth every 6 (six) hours as needed for mild pain (pain score 1-3), fever or headache (or Fever >/= 101).   albuterol  108 (90 Base) MCG/ACT inhaler Commonly known as: VENTOLIN   HFA Inhale 2 puffs into the lungs every 6 (six) hours as needed for wheezing or shortness of breath.   amLODipine  5 MG tablet Commonly known as: NORVASC  Take 1 tablet (5 mg total) by mouth daily.   apixaban  5 MG Tabs tablet Commonly known as: ELIQUIS  Take 1 tablet (5 mg total) by mouth 2 (two) times daily.   Breztri  Aerosphere 160-9-4.8 MCG/ACT Aero inhaler Generic drug: budesonide -glycopyrrolate -formoterol  USE 2 INHALATIONS BY MOUTH INTO  THE LUNGS IN THE MORNING AND AT  BEDTIME   dorzolamide  2 % ophthalmic solution Commonly known as: TRUSOPT  Place 1 drop into the left eye 2 (two) times daily.   doxazosin  4 MG tablet Commonly known as: CARDURA  TAKE ONE-HALF TABLET BY MOUTH  DAILY   fish oil-omega-3 fatty acids  1000 MG capsule Take 1,000 mg by mouth daily.   multivitamin tablet Take 1 tablet by mouth daily.   pantoprazole  40 MG tablet Commonly known as: Protonix  Take 1 tablet (40 mg total) by mouth daily.   pravastatin  40 MG tablet Commonly known as: PRAVACHOL  Take 1 tablet (40 mg total) by mouth daily. 09/29/23-PT NEEDS APPOINTMENT   solifenacin  10 MG tablet Commonly known as: VESICARE  Take 1 tablet (10 mg total) by mouth daily.   thiamine  100 MG tablet Commonly known as: Vitamin B-1 Take 1 tablet (100 mg total) by mouth daily.   traZODone  50 MG tablet Commonly known as: DESYREL  TAKE 1 TABLET BY MOUTH AT  BEDTIME   VITAMIN C  PO Take 1 tablet by mouth daily.        Contact information for after-discharge care     Destination     [facility] .   Service: Skilled Nursing Contact information: [address] [place]  [address] [phone]                    Allergies  Allergen Reactions   Ibuprofen     Oxycodone  Itching    Consultations: None   Procedures/Studies: US  RENAL Result Date: 02/27/2024 CLINICAL DATA:  Acute kidney injury EXAM: RENAL / URINARY  TRACT ULTRASOUND COMPLETE COMPARISON:  10/07/2021 FINDINGS: Right Kidney: Renal measurements: 10.8 x 5.7 x 4.7 cm = volume: 150 mL. 5 x 4.8 x 4.9 cm cortical cyst exophytic from mid kidney. Parenchyma mildly hypoechoic compared to adjacent liver. No hydronephrosis. Left Kidney: Renal measurements: 10.7 x 5 x 4.3 cm = volume: 120 mL. Cortical thinning. Normal cortical echotexture. No hydronephrosis. 0.9 x 0.8 cm probable cortical cyst. Bladder: Nondistended Other: Technologist describes technically difficult study secondary to body habitus and gas. IMPRESSION: 1. No hydronephrosis. 2. Bilateral renal cysts. Electronically Signed   By: JONETTA Faes M.D.   On: 02/27/2024 16:31   DG Chest Port 1 View Result Date: 02/27/2024 CLINICAL DATA:  Syncope EXAM: PORTABLE CHEST 1 VIEW COMPARISON:  December 08, 2023 FINDINGS: Chronic appearing bilateral interstitial changes. No focal consolidation. No pleural effusions. No pneumothorax. Cardiomediastinal silhouette is unchanged. Hiatal hernia. No acute osseous findings. IMPRESSION: No active disease.  Likely chronic interstitial changes. Electronically Signed   By: Michaeline Blanch M.D.   On: 02/27/2024 14:27   (Echo, Carotid, EGD, Colonoscopy,  ERCP)    Subjective: Patient seen and examined.  No overnight events.  Remains fairly stable.  No more diarrhea.  It was acceptable for her to go to [facility].   Discharge Exam: Vitals:   03/06/24 2052 03/07/24 0418  BP: (!) 136/47 (!) 163/71  Pulse: 65 62  Resp: 18 18  Temp: 98.5 F (36.9 C) 98.1 F (36.7 C)  SpO2: 92% 97%   Vitals:   03/06/24 1655 03/06/24 2048 03/06/24 2052 03/07/24 0418  BP: (!) 127/54  (!) 136/47 (!) 163/71  Pulse: 79  65 62  Resp: 18  18 18   Temp: 97.8 F (36.6 C)  98.5 F (36.9 C) 98.1 F (36.7 C)  TempSrc: Oral     SpO2: 96% 98% 92% 97%  Weight:      Height:        General: Pt is alert, awake, not in acute distress Alert awake and oriented.  Generalized weakness but no focal  deficit. Cardiovascular: RRR, S1/S2 +, no rubs, no gallops Respiratory: CTA bilaterally, no wheezing, no rhonchi Abdominal: Soft, NT, ND, bowel sounds + Extremities: no edema, no cyanosis    The results of significant diagnostics from this hospitalization (including imaging, microbiology, ancillary and laboratory) are listed below for reference.     Microbiology: Recent Results (from the past 240 hours)  Urine Culture (for pregnant, neutropenic or urologic patients or patients with an indwelling urinary catheter)     Status: Abnormal   Collection Time: 02/27/24  3:29 PM   Specimen: Urine, Clean Catch  Result Value Ref Range Status   Specimen Description URINE, CLEAN CATCH  Final   Special Requests NONE  Final   Culture (A)  Final    >=100,000 COLONIES/mL ESCHERICHIA COLI Two isolates with different morphologies were identified as the same organism.The most resistant organism was reported. Performed at [facility], [address] [place], [place] [address]    Report Status 03/01/2024 FINAL  Final   Organism ID, Bacteria ESCHERICHIA COLI (A)  Final      Susceptibility   Escherichia coli - MIC*    AMPICILLIN >=32 RESISTANT Resistant     CEFAZOLIN  <=4 SENSITIVE Sensitive     CEFEPIME <=0.12 SENSITIVE Sensitive     CEFTRIAXONE  <=0.25 SENSITIVE Sensitive     CIPROFLOXACIN <=0.25 SENSITIVE Sensitive     GENTAMICIN <=1 SENSITIVE Sensitive     IMIPENEM <=0.25 SENSITIVE Sensitive     NITROFURANTOIN  <=16 SENSITIVE Sensitive     TRIMETH/SULFA <=20 SENSITIVE Sensitive     AMPICILLIN/SULBACTAM >=32 RESISTANT Resistant     PIP/TAZO <=4 SENSITIVE Sensitive ug/mL    * >=100,000 COLONIES/mL ESCHERICHIA COLI     Labs: BNP (last 3 results) Recent Labs    12/08/23 0411  BNP 55.0   Basic Metabolic Panel: Recent Labs  Lab 03/01/24 0416  NA 139  K 4.1  CL 112*  CO2 20*  GLUCOSE 76  BUN 23  CREATININE 1.17*  CALCIUM  8.9   Liver Function Tests: No results for input(s):  AST, ALT, ALKPHOS, BILITOT, PROT, ALBUMIN  in the last 168 hours. No results for input(s): LIPASE, AMYLASE in the last 168 hours. No results for input(s): AMMONIA in the last 168 hours. CBC: No results for input(s): WBC, NEUTROABS, HGB, HCT, MCV, PLT in the last 168 hours. Cardiac Enzymes: No results for input(s): CKTOTAL, CKMB, CKMBINDEX, TROPONINI in the last 168 hours. BNP: Invalid input(s): POCBNP CBG: Recent Labs  Lab 03/04/24 2133  GLUCAP 91   D-Dimer  No results for input(s): DDIMER in the last 72 hours. Hgb A1c No results for input(s): HGBA1C in the last 72 hours. Lipid Profile No results for input(s): CHOL, HDL, LDLCALC, TRIG, CHOLHDL, LDLDIRECT in the last 72 hours. Thyroid  function studies No results for input(s): TSH, T4TOTAL, T3FREE, THYROIDAB in the last 72 hours.  Invalid input(s): FREET3 Anemia work up No results for input(s): VITAMINB12, FOLATE, FERRITIN, TIBC, IRON , RETICCTPCT in the last 72 hours. Urinalysis    Component Value Date/Time   COLORURINE AMBER (A) 02/27/2024 1529   APPEARANCEUR CLOUDY (A) 02/27/2024 1529   LABSPEC 1.020 02/27/2024 1529   PHURINE 5.0 02/27/2024 1529   GLUCOSEU NEGATIVE 02/27/2024 1529   HGBUR NEGATIVE 02/27/2024 1529   BILIRUBINUR NEGATIVE 02/27/2024 1529   KETONESUR NEGATIVE 02/27/2024 1529   PROTEINUR 100 (A) 02/27/2024 1529   NITRITE NEGATIVE 02/27/2024 1529   LEUKOCYTESUR MODERATE (A) 02/27/2024 1529   Sepsis Labs No results for input(s): WBC in the last 168 hours.  Invalid input(s): PROCALCITONIN, LACTICIDVEN Microbiology Recent Results (from the past 240 hours)  Urine Culture (for pregnant, neutropenic or urologic patients or patients with an indwelling urinary catheter)     Status: Abnormal   Collection Time: 02/27/24  3:29 PM   Specimen: Urine, Clean Catch  Result Value Ref Range Status   Specimen Description URINE, CLEAN CATCH   Final   Special Requests NONE  Final   Culture (A)  Final    >=100,000 COLONIES/mL ESCHERICHIA COLI Two isolates with different morphologies were identified as the same organism.The most resistant organism was reported. Performed at [facility], [address] [place], [place] [address]    Report Status 03/01/2024 FINAL  Final   Organism ID, Bacteria ESCHERICHIA COLI (A)  Final      Susceptibility   Escherichia coli - MIC*    AMPICILLIN >=32 RESISTANT Resistant     CEFAZOLIN  <=4 SENSITIVE Sensitive     CEFEPIME <=0.12 SENSITIVE Sensitive     CEFTRIAXONE  <=0.25 SENSITIVE Sensitive     CIPROFLOXACIN <=0.25 SENSITIVE Sensitive     GENTAMICIN <=1 SENSITIVE Sensitive     IMIPENEM <=0.25 SENSITIVE Sensitive     NITROFURANTOIN  <=16 SENSITIVE Sensitive     TRIMETH/SULFA <=20 SENSITIVE Sensitive     AMPICILLIN/SULBACTAM >=32 RESISTANT Resistant     PIP/TAZO <=4 SENSITIVE Sensitive ug/mL    * >=100,000 COLONIES/mL ESCHERICHIA COLI     Time coordinating discharge: 35 minutes  SIGNED:   Renato Applebaum, MD  [facility] 03/07/2024, 8:15 AM

## 2024-03-06 NOTE — Progress Notes (Signed)
 PROGRESS NOTE    Virginia Walters  FMW:[id number] DOB: Aug 15, 1941 DOA: 02/27/2024 PCP: Duanne Butler DASEN, MD    Brief Narrative:  Patient is 83 year old female with history of hypertension, hyperlipidemia, COPD, heart failure with preserved ejection fraction, known ejection fraction of 60%, pulmonary embolism status post thrombectomy on Eliquis , recent alcohol use disorder, chronic back pain, neuropathy and prior GI bleeds presented with fall.  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  Frequent admission with dehydration.  Apparently, she was ready to work with physical therapy and took long time to come to the door , while at the door she got dizzy and fell.  Reportedly she had a blood pressure of 88 systolic on EMS arrival. Physical therapist visiting her at home called EMS to pick her up. At the emergency room, afebrile.  Blood pressure 107/31.  BUN 25, creatinine 2.16, alcohol level undetectable.  Chest x-ray normal.  Patient was given IV fluids, urine cultures collected and admitted with IV fluids and antibiotics.   Subjective: Patient seen and examined.  No overnight events.  She denies any complaints.  She was wondering why [name] has not called her.  She knows that her niece will know all the areas SNF.  She is agreeable to go to SNF.  Assessment & Plan:   AKI on CKD stage IIIa: Baseline creatinine about 1.1.  Presented with creatinine of 2.16.  Probably prerenal with ongoing diarrhea, use of antihypertensive medications including lisinopril . Appropriately improving.  Urine output is adequate.   Now maintaining on oral nutrition.  Acute on chronic diarrhea: Likely noninfective.  No diarrhea since admission.  Discontinue precautions.  Symptomatic management.  Hypokalemia: Replaced.  Adequate.  Syncope and collapse, suspect vasovagal or orthostatics: Discontinue lisinopril .  Monitor orthostatic symptoms.  History of PE  on chronic anticoagulation: Continue Eliquis .  No evidence of bleeding.  UTI present on admission: Urine culture growing E. coli.  Rocephin  day 4.  change to oral antibiotics to complete 7 days of therapy.  Essential hypertension: Blood pressure improving now and elevated.  Lisinopril  discontinued.  Started on amlodipine .  Chronic medical issues including Diastolic congestive heart failure, euvolemic today.   COPD, stable.  On Breztri  and albuterol  nebs. GERD, on Protonix .  MedSurg status.  Stable to transfer to SNF when bed available.   DVT prophylaxis:  apixaban  (ELIQUIS ) tablet 5 mg   Code Status: Full code Family Communication: None today. Disposition Plan: Status is: Medically stable.  Needs SNF bed.   Consultants:  None  Procedures:  None  Antimicrobials:  Rocephin  7/15--7/19 Keflex  7/19--completed.     Objective: Vitals:   03/05/24 2041 03/06/24 0536 03/06/24 0740 03/06/24 0808  BP:  (!) 147/53  (!) 150/48  Pulse:  60  73  Resp:  18  18  Temp:  98.1 F (36.7 C)  (!) 97.3 F (36.3 C)  TempSrc:  Oral  Oral  SpO2: 95% 94%  96%  Weight:   92 kg   Height:        Intake/Output Summary (Last 24 hours) at 03/06/2024 1122 Last data filed at 03/06/2024 9386 Gross per 24 hour  Intake 480 ml  Output 1300 ml  Net -820 ml   Filed Weights   03/01/24 0339 03/04/24 0500 03/06/24 0740  Weight: 93.1 kg 92.8 kg 92 kg    Examination:  General exam: Looks comfortable.  Alert awake and oriented.  Interactive. Respiratory system: Clear to auscultation. Respiratory effort normal.  Cardiovascular system: S1 & S2 heard, RRR. No JVD, murmurs, rubs, gallops or clicks. No pedal edema. Gastrointestinal system: Soft and nontender.  Obese and pendulous.   Central nervous system: Alert awake and mostly oriented.  She has some generalized weakness.  No focal deficits.    Data Reviewed: I have personally reviewed following labs and imaging studies  CBC: No results for  input(s): WBC, NEUTROABS, HGB, HCT, MCV, PLT in the last 168 hours.  Basic Metabolic Panel: Recent Labs  Lab 02/29/24 0522 03/01/24 0416  NA 138 139  K 3.2* 4.1  CL 107 112*  CO2 21* 20*  GLUCOSE 85 76  BUN 25* 23  CREATININE 1.41* 1.17*  CALCIUM  9.2 8.9   GFR: Estimated Creatinine Clearance: 43.2 mL/min (A) (by C-G formula based on SCr of 1.17 mg/dL (H)). Liver Function Tests: No results for input(s): AST, ALT, ALKPHOS, BILITOT, PROT, ALBUMIN  in the last 168 hours.  No results for input(s): LIPASE, AMYLASE in the last 168 hours. No results for input(s): AMMONIA in the last 168 hours. Coagulation Profile: No results for input(s): INR, PROTIME in the last 168 hours. Cardiac Enzymes: No results for input(s): CKTOTAL, CKMB, CKMBINDEX, TROPONINI in the last 168 hours. BNP (last 3 results) No results for input(s): PROBNP in the last 8760 hours. HbA1C: No results for input(s): HGBA1C in the last 72 hours. CBG: Recent Labs  Lab 03/04/24 2133  GLUCAP 91   Lipid Profile: No results for input(s): CHOL, HDL, LDLCALC, TRIG, CHOLHDL, LDLDIRECT in the last 72 hours. Thyroid  Function Tests: No results for input(s): TSH, T4TOTAL, FREET4, T3FREE, THYROIDAB in the last 72 hours. Anemia Panel: No results for input(s): VITAMINB12, FOLATE, FERRITIN, TIBC, IRON , RETICCTPCT in the last 72 hours. Sepsis Labs: No results for input(s): PROCALCITON, LATICACIDVEN in the last 168 hours.  Recent Results (from the past 240 hours)  Urine Culture (for pregnant, neutropenic or urologic patients or patients with an indwelling urinary catheter)     Status: Abnormal   Collection Time: 02/27/24  3:29 PM   Specimen: Urine, Clean Catch  Result Value Ref Range Status   Specimen Description URINE, CLEAN CATCH  Final   Special Requests NONE  Final   Culture (A)  Final    >=100,000 COLONIES/mL ESCHERICHIA COLI Two isolates  with different morphologies were identified as the same organism.The most resistant organism was reported. Performed at [facility], [address] [place], [place] [address]    Report Status 03/01/2024 FINAL  Final   Organism ID, Bacteria ESCHERICHIA COLI (A)  Final      Susceptibility   Escherichia coli - MIC*    AMPICILLIN >=32 RESISTANT Resistant     CEFAZOLIN  <=4 SENSITIVE Sensitive     CEFEPIME <=0.12 SENSITIVE Sensitive     CEFTRIAXONE  <=0.25 SENSITIVE Sensitive     CIPROFLOXACIN <=0.25 SENSITIVE Sensitive     GENTAMICIN <=1 SENSITIVE Sensitive     IMIPENEM <=0.25 SENSITIVE Sensitive     NITROFURANTOIN  <=16 SENSITIVE Sensitive     TRIMETH/SULFA <=20 SENSITIVE Sensitive     AMPICILLIN/SULBACTAM >=32 RESISTANT Resistant     PIP/TAZO <=4 SENSITIVE Sensitive ug/mL    * >=100,000 COLONIES/mL ESCHERICHIA COLI         Radiology Studies: No results found.       Scheduled Meds:  amLODipine   5 mg Oral Daily   apixaban   5 mg Oral BID   budesonide -glycopyrrolate -formoterol   2 puff Inhalation BID   dorzolamide   1 drop Left Eye BID  doxazosin   2 mg Oral Daily   fesoterodine   4 mg Oral Daily   Gerhardt's butt cream   Topical BID   pantoprazole   40 mg Oral Daily   sodium chloride  flush  3 mL Intravenous Q12H   thiamine   100 mg Oral Daily   traZODone   50 mg Oral QHS   Continuous Infusions:     LOS: 7 days    Time spent: 35 minutes    Renato Applebaum, MD [facility]

## 2024-03-06 NOTE — Progress Notes (Signed)
 Physical Therapy Treatment Patient Details Name: Virginia Walters MRN: [id number] DOB: 1940/09/15 Today's Date: 03/06/2024   History of Present Illness Pt is an 83 y.o. female presenting to hospital 12/01/23 after being found down in her yard by EMS after possible syncopal episode; incontinent of stool and urine; pt was doing colonoscopy prep.  Pt admitted with UTI, possible syncope, acute metabolic encephalopathy, AKI, symptomatic anemia.  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  Recent hospitalization 4/1 to 11/17/23 with symptomatic anemia requiring 3 units PRBC's complicated by acute encephalopathy.    PT Comments  Continuing work on functional mobility and activity tolerance;  Session focused on progressive amb, and introducing Rollator as a safe option to stop and sit when fatigued; Noteworthy that pt has difficulty with novel tasks; she needed step by step cues for safety and brakes use, and ultimately up to mod assist to maintain balance; Patient will benefit from continued inpatient follow up therapy, <3 hours/day     If plan is discharge home, recommend the following: A little help with walking and/or transfers;Assistance with cooking/housework;Assist for transportation;Help with stairs or ramp for entrance;Supervision due to cognitive status   Can travel by private vehicle     No  Equipment Recommendations  None recommended by PT    Recommendations for Other Services       Precautions / Restrictions Precautions Precautions: Fall Recall of Precautions/Restrictions: Impaired Precaution/Restrictions Comments: Poor self-monitor for activity tolerance Restrictions Weight Bearing Restrictions Per Provider Order: No     Mobility  Bed Mobility Overal bed mobility: Needs Assistance Bed Mobility: Supine to Sit, Sit to Supine     Supine to sit: Mod assist Sit to supine:  Min assist   General bed mobility comments: increased time and light mod assist with trunk ot pull to sit, patient able to return to supine with increased time and lots of cues for positioning, and min assist to help LEs back into bed    Transfers Overall transfer level: Needs assistance Equipment used: Rollator (4 wheels) Transfers: Sit to/from Stand Sit to Stand: Mod assist           General transfer comment: Light mod assist to power up to stand; close guard and safety cues sith sit to stand from rollator seat    Ambulation/Gait Ambulation/Gait assistance: Mod assist Gait Distance (Feet): 35 Feet (x2) Assistive device: Rollator (4 wheels) Gait Pattern/deviations: Step-through pattern, Decreased stride length, Wide base of support       General Gait Details: Mod assist to keep rollaotr close to pt and to stabilize rollator; pt with difficulty controlling rollator   Stairs             Wheelchair Mobility     Tilt Bed    Modified Rankin (Stroke Patients Only)       Balance     Sitting balance-Leahy Scale: Fair       Standing balance-Leahy Scale: Poor                              Communication Communication Communication: No apparent difficulties  Cognition Arousal: Alert Behavior During Therapy: WFL for tasks assessed/performed   PT - Cognitive impairments: Problem solving, Safety/Judgement, Awareness, Memory, Attention, Orientation   Orientation impairments: Situation  PT - Cognition Comments: More oriented to her situation; noting incr time to follow commands; difficutly problem-solving Following commands: Impaired Following commands impaired: Follows one step commands inconsistently    Cueing Cueing Techniques: Verbal cues, Gestural cues  Exercises      General Comments        Pertinent Vitals/Pain Pain Assessment Pain Assessment: Faces Faces Pain Scale: Hurts a little bit Pain Location:  back Pain Descriptors / Indicators: Sore Pain Intervention(s): Monitored during session    Home Living                          Prior Function            PT Goals (current goals can now be found in the care plan section) Acute Rehab PT Goals Patient Stated Goal: Wants to go home PT Goal Formulation: With patient Time For Goal Achievement: 03/13/24 Potential to Achieve Goals: Fair Progress towards PT goals: Progressing toward goals    Frequency    Min 2X/week      PT Plan      Co-evaluation              AM-PAC PT 6 Clicks Mobility   Outcome Measure  Help needed turning from your back to your side while in a flat bed without using bedrails?: A Little Help needed moving from lying on your back to sitting on the side of a flat bed without using bedrails?: A Lot Help needed moving to and from a bed to a chair (including a wheelchair)?: A Lot Help needed standing up from a chair using your arms (e.g., wheelchair or bedside chair)?: A Lot Help needed to walk in hospital room?: A Lot Help needed climbing 3-5 steps with a railing? : A Lot 6 Click Score: 13    End of Session Equipment Utilized During Treatment: Gait belt Activity Tolerance: Patient tolerated treatment well Patient left: in bed;with call bell/phone within reach;with bed alarm set Nurse Communication: Mobility status PT Visit Diagnosis: Unsteadiness on feet (R26.81);Muscle weakness (generalized) (M62.81);Difficulty in walking, not elsewhere classified (R26.2)     Time: 1200-1230 PT Time Calculation (min) (ACUTE ONLY): 30 min  Charges:    $Gait Training: 23-37 mins PT General Charges $$ ACUTE PT VISIT: 1 Visit                     Silvano Currier, PT  Acute Rehabilitation Services Office [phone] Secure Chat welcomed    Silvano VEAR Currier 03/06/2024, 2:50 PM

## 2024-03-06 NOTE — Plan of Care (Signed)

## 2024-03-07 NOTE — TOC Progression Note (Signed)
 Transition of Care [facility]) - Progression Note    Patient Details  Name: Virginia Walters MRN: [id number] Date of Birth: 09/28/1940  Transition of Care [facility]) CM/SW Contact  Luann SHAUNNA Cumming, [place] Phone Number: 03/07/2024, 10:50 AM  Clinical Narrative:     Requested confirmation from [facility] that financials have been solidified. Informed that their admissions person called pt's family and was unable to reach them. CSW provided them with son in law's contact info.   1020: CSW called son in law to update. Son in law explained [facility] requires check payment. He only has access to pt's debit card. Pt's daughter won't be able to provide check until later this evening. Son in law states that [facility] notified him that pt would have to admit tomorrow after family can provide check.   CSW confirmed with [facility] that they will need to admit tomorrow since family cannot provide check today.   Expected Discharge Plan: Skilled Nursing Facility Barriers to Discharge: No SNF bed               Expected Discharge Plan and Services In-house Referral: Clinical Social Work   Post Acute Care Choice: Skilled Nursing Facility Living arrangements for the past 2 months: Single Family Home Expected Discharge Date: 03/07/24                                     Social Drivers of Health (SDOH) Interventions SDOH Screenings   Food Insecurity: No Food Insecurity (02/28/2024)  Housing: Low Risk  (02/28/2024)  Transportation Needs: No Transportation Needs (02/28/2024)  Utilities: Not At Risk (02/28/2024)  Alcohol Screen: Low Risk  (05/25/2022)  Depression (PHQ2-9): Low Risk  (02/23/2024)  Financial Resource Strain: Low Risk  (05/25/2022)  Physical Activity: Inactive (05/25/2022)  Social Connections: Unknown (02/28/2024)  Stress: No Stress Concern Present (05/25/2022)  Tobacco Use: Medium Risk (02/27/2024)    Readmission Risk Interventions    09/20/2021   12:40 PM   Readmission Risk Prevention Plan  Transportation Screening Complete  PCP or Specialist Appt within 5-7 Days Complete  Home Care Screening Complete  Medication Review (RN CM) Complete

## 2024-03-07 NOTE — Progress Notes (Signed)
 Occupational Therapy Treatment Patient Details Name: Virginia Walters MRN: [id number] DOB: April 12, 1941 Today's Date: 03/07/2024   History of present illness Pt is an 83 y.o. female presenting to hospital 12/01/23 after being found down in her yard by EMS after possible syncopal episode; incontinent of stool and urine; pt was doing colonoscopy prep.  Pt admitted with UTI, possible syncope, acute metabolic encephalopathy, AKI, symptomatic anemia.  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  Recent hospitalization 4/1 to 11/17/23 with symptomatic anemia requiring 3 units PRBC's complicated by acute encephalopathy.   OT comments  Patient continues to make good progress with OT treatment and is motivated to increase independence. Patient was min assist for supine to sit and performed gown change seated on EOB. Patient able to ambulate to sink for grooming tasks and stood without rest break before ambulating in hallway. Transfer training performed to address toilet transfer before patient returning to supine. Patient will benefit from continued inpatient follow up therapy, <3 hours/day. Acute OT to continue to follow to address established goals to facilitate DC to next venue of care.        If plan is discharge home, recommend the following:  A little help with walking and/or transfers;A little help with bathing/dressing/bathroom;Assistance with cooking/housework;Direct supervision/assist for medications management   Equipment Recommendations  None recommended by OT    Recommendations for Other Services      Precautions / Restrictions Precautions Precautions: Fall Recall of Precautions/Restrictions: Impaired Precaution/Restrictions Comments: Poor self-monitor for activity tolerance Restrictions Weight Bearing Restrictions Per Provider Order: No       Mobility Bed Mobility Overal bed  mobility: Needs Assistance Bed Mobility: Supine to Sit, Sit to Supine     Supine to sit: Min assist Sit to supine: Min assist   General bed mobility comments: assistance with trunk to get to EOB and assistance with BLE to return to supine    Transfers Overall transfer level: Needs assistance Equipment used: Rolling walker (2 wheels) Transfers: Sit to/from Stand, Bed to chair/wheelchair/BSC Sit to Stand: Contact guard assist     Step pivot transfers: Contact guard assist     General transfer comment: CGA for stability with cues for hand placement     Balance Overall balance assessment: Needs assistance Sitting-balance support: No upper extremity supported, Feet unsupported, Bilateral upper extremity supported, Single extremity supported Sitting balance-Leahy Scale: Fair     Standing balance support: No upper extremity supported, During functional activity, Single extremity supported, Bilateral upper extremity supported Standing balance-Leahy Scale: Poor Standing balance comment: able to stand at sink with one to no UE support                           ADL either performed or assessed with clinical judgement   ADL Overall ADL's : Needs assistance/impaired     Grooming: Wash/dry hands;Wash/dry face;Oral care;Brushing hair;Contact guard assist;Standing Grooming Details (indicate cue type and reason): at sink         Upper Body Dressing : Set up;Sitting Upper Body Dressing Details (indicate cue type and reason): change gown Lower Body Dressing: Minimal assistance;Sit to/from stand   Toilet Transfer: Contact guard assist;Ambulation;Rolling walker (2 wheels) Toilet Transfer Details (indicate cue type and reason): simulated         Functional mobility during ADLs: Contact guard assist      Extremity/Trunk Assessment Upper Extremity Assessment  Upper Extremity Assessment: Generalized weakness            Vision       Perception     Praxis      Communication Communication Communication: No apparent difficulties   Cognition Arousal: Alert Behavior During Therapy: WFL for tasks assessed/performed Cognition: Cognition impaired   Orientation impairments: Time, Situation Awareness: Intellectual awareness intact, Online awareness impaired Memory impairment (select all impairments): Short-term memory Attention impairment (select first level of impairment): Sustained attention Executive functioning impairment (select all impairments): Problem solving                   Following commands: Impaired Following commands impaired: Follows one step commands inconsistently      Cueing   Cueing Techniques: Verbal cues, Gestural cues  Exercises      Shoulder Instructions       General Comments      Pertinent Vitals/ Pain       Pain Assessment Pain Assessment: Faces Faces Pain Scale: Hurts a little bit Pain Location: back Pain Descriptors / Indicators: Sore Pain Intervention(s): Monitored during session, Repositioned  Home Living                                          Prior Functioning/Environment              Frequency  Min 2X/week        Progress Toward Goals  OT Goals(current goals can now be found in the care plan section)  Progress towards OT goals: Progressing toward goals  Acute Rehab OT Goals Patient Stated Goal: to go to rehab then home OT Goal Formulation: With patient Time For Goal Achievement: 03/13/24 Potential to Achieve Goals: Good ADL Goals Pt Will Perform Grooming: with modified independence;standing Pt Will Perform Lower Body Dressing: with modified independence;sit to/from stand Pt Will Transfer to Toilet: with modified independence;ambulating;regular height toilet Pt Will Perform Toileting - Clothing Manipulation and hygiene: with modified independence;sitting/lateral leans;sit to/from stand Additional ADL Goal #1: Pt will complete pillbox test with 5/5  accuracy to promote safety with med management in home environment  Plan      Co-evaluation                 AM-PAC OT 6 Clicks Daily Activity     Outcome Measure   Help from another person eating meals?: None Help from another person taking care of personal grooming?: A Little Help from another person toileting, which includes using toliet, bedpan, or urinal?: A Little Help from another person bathing (including washing, rinsing, drying)?: A Little Help from another person to put on and taking off regular upper body clothing?: A Little Help from another person to put on and taking off regular lower body clothing?: A Little 6 Click Score: 19    End of Session Equipment Utilized During Treatment: Gait belt;Rolling walker (2 wheels)  OT Visit Diagnosis: Unsteadiness on feet (R26.81);Other abnormalities of gait and mobility (R26.89);History of falling (Z91.81);Muscle weakness (generalized) (M62.81)   Activity Tolerance Patient tolerated treatment well   Patient Left in bed;with call bell/phone within reach;with bed alarm set   Nurse Communication Mobility status        Time: 9172-9147 OT Time Calculation (min): 25 min  Charges: OT General Charges $OT Visit: 1 Visit OT Treatments $Self Care/Home Management : 23-37 mins  Dick Laine, OTA Acute Rehabilitation Services  Office [phone]   Jeb LITTIE Laine 03/07/2024, 12:30 PM

## 2024-03-07 NOTE — Progress Notes (Signed)
 AVS completed for discharge packet.

## 2024-03-08 DIAGNOSIS — K449 Diaphragmatic hernia without obstruction or gangrene: Secondary | ICD-10-CM | POA: Diagnosis not present

## 2024-03-08 DIAGNOSIS — K5900 Constipation, unspecified: Secondary | ICD-10-CM | POA: Diagnosis not present

## 2024-03-08 DIAGNOSIS — K5750 Diverticulosis of both small and large intestine without perforation or abscess without bleeding: Secondary | ICD-10-CM | POA: Diagnosis not present

## 2024-03-08 DIAGNOSIS — K5730 Diverticulosis of large intestine without perforation or abscess without bleeding: Secondary | ICD-10-CM | POA: Diagnosis not present

## 2024-03-08 DIAGNOSIS — M5030 Other cervical disc degeneration, unspecified cervical region: Secondary | ICD-10-CM | POA: Diagnosis not present

## 2024-03-08 DIAGNOSIS — J449 Chronic obstructive pulmonary disease, unspecified: Secondary | ICD-10-CM | POA: Diagnosis not present

## 2024-03-08 DIAGNOSIS — N189 Chronic kidney disease, unspecified: Secondary | ICD-10-CM | POA: Diagnosis not present

## 2024-03-08 DIAGNOSIS — H54415A Blindness right eye category 5, normal vision left eye: Secondary | ICD-10-CM | POA: Diagnosis not present

## 2024-03-08 DIAGNOSIS — S329XXA Fracture of unspecified parts of lumbosacral spine and pelvis, initial encounter for closed fracture: Secondary | ICD-10-CM | POA: Diagnosis not present

## 2024-03-08 DIAGNOSIS — I110 Hypertensive heart disease with heart failure: Secondary | ICD-10-CM | POA: Diagnosis not present

## 2024-03-08 DIAGNOSIS — Z7901 Long term (current) use of anticoagulants: Secondary | ICD-10-CM | POA: Diagnosis not present

## 2024-03-08 DIAGNOSIS — N281 Cyst of kidney, acquired: Secondary | ICD-10-CM | POA: Diagnosis not present

## 2024-03-08 DIAGNOSIS — I5032 Chronic diastolic (congestive) heart failure: Secondary | ICD-10-CM | POA: Diagnosis not present

## 2024-03-08 DIAGNOSIS — Z743 Need for continuous supervision: Secondary | ICD-10-CM | POA: Diagnosis not present

## 2024-03-08 DIAGNOSIS — Z7401 Bed confinement status: Secondary | ICD-10-CM | POA: Diagnosis not present

## 2024-03-08 DIAGNOSIS — M160 Bilateral primary osteoarthritis of hip: Secondary | ICD-10-CM | POA: Diagnosis not present

## 2024-03-08 DIAGNOSIS — M4802 Spinal stenosis, cervical region: Secondary | ICD-10-CM | POA: Diagnosis not present

## 2024-03-08 DIAGNOSIS — G629 Polyneuropathy, unspecified: Secondary | ICD-10-CM | POA: Diagnosis not present

## 2024-03-08 DIAGNOSIS — W19XXXA Unspecified fall, initial encounter: Secondary | ICD-10-CM | POA: Diagnosis not present

## 2024-03-08 DIAGNOSIS — N3281 Overactive bladder: Secondary | ICD-10-CM | POA: Diagnosis not present

## 2024-03-08 DIAGNOSIS — R531 Weakness: Secondary | ICD-10-CM | POA: Diagnosis not present

## 2024-03-08 DIAGNOSIS — M25552 Pain in left hip: Secondary | ICD-10-CM | POA: Diagnosis not present

## 2024-03-08 DIAGNOSIS — I2699 Other pulmonary embolism without acute cor pulmonale: Secondary | ICD-10-CM | POA: Diagnosis not present

## 2024-03-08 DIAGNOSIS — R509 Fever, unspecified: Secondary | ICD-10-CM | POA: Diagnosis not present

## 2024-03-08 DIAGNOSIS — M47812 Spondylosis without myelopathy or radiculopathy, cervical region: Secondary | ICD-10-CM | POA: Diagnosis not present

## 2024-03-08 DIAGNOSIS — G8929 Other chronic pain: Secondary | ICD-10-CM | POA: Diagnosis not present

## 2024-03-08 DIAGNOSIS — K219 Gastro-esophageal reflux disease without esophagitis: Secondary | ICD-10-CM | POA: Diagnosis not present

## 2024-03-08 DIAGNOSIS — I6782 Cerebral ischemia: Secondary | ICD-10-CM | POA: Diagnosis not present

## 2024-03-08 DIAGNOSIS — E785 Hyperlipidemia, unspecified: Secondary | ICD-10-CM | POA: Diagnosis not present

## 2024-03-08 DIAGNOSIS — Z8673 Personal history of transient ischemic attack (TIA), and cerebral infarction without residual deficits: Secondary | ICD-10-CM | POA: Diagnosis not present

## 2024-03-08 DIAGNOSIS — I6529 Occlusion and stenosis of unspecified carotid artery: Secondary | ICD-10-CM | POA: Diagnosis not present

## 2024-03-08 DIAGNOSIS — M549 Dorsalgia, unspecified: Secondary | ICD-10-CM | POA: Diagnosis not present

## 2024-03-08 DIAGNOSIS — N1831 Chronic kidney disease, stage 3a: Secondary | ICD-10-CM | POA: Diagnosis not present

## 2024-03-08 DIAGNOSIS — R404 Transient alteration of awareness: Secondary | ICD-10-CM | POA: Diagnosis not present

## 2024-03-08 DIAGNOSIS — G4700 Insomnia, unspecified: Secondary | ICD-10-CM | POA: Diagnosis not present

## 2024-03-08 DIAGNOSIS — S199XXA Unspecified injury of neck, initial encounter: Secondary | ICD-10-CM | POA: Diagnosis not present

## 2024-03-08 DIAGNOSIS — M1990 Unspecified osteoarthritis, unspecified site: Secondary | ICD-10-CM | POA: Diagnosis not present

## 2024-03-08 DIAGNOSIS — R6889 Other general symptoms and signs: Secondary | ICD-10-CM | POA: Diagnosis not present

## 2024-03-08 DIAGNOSIS — Z86711 Personal history of pulmonary embolism: Secondary | ICD-10-CM | POA: Diagnosis not present

## 2024-03-08 DIAGNOSIS — M47816 Spondylosis without myelopathy or radiculopathy, lumbar region: Secondary | ICD-10-CM | POA: Diagnosis not present

## 2024-03-08 DIAGNOSIS — H409 Unspecified glaucoma: Secondary | ICD-10-CM | POA: Diagnosis not present

## 2024-03-08 DIAGNOSIS — M545 Low back pain, unspecified: Secondary | ICD-10-CM | POA: Diagnosis not present

## 2024-03-08 DIAGNOSIS — Z9181 History of falling: Secondary | ICD-10-CM | POA: Diagnosis not present

## 2024-03-08 DIAGNOSIS — N179 Acute kidney failure, unspecified: Secondary | ICD-10-CM | POA: Diagnosis not present

## 2024-03-08 DIAGNOSIS — S32512A Fracture of superior rim of left pubis, initial encounter for closed fracture: Secondary | ICD-10-CM | POA: Diagnosis not present

## 2024-03-08 DIAGNOSIS — W1839XA Other fall on same level, initial encounter: Secondary | ICD-10-CM | POA: Diagnosis not present

## 2024-03-08 DIAGNOSIS — R54 Age-related physical debility: Secondary | ICD-10-CM | POA: Diagnosis not present

## 2024-03-08 DIAGNOSIS — S3993XA Unspecified injury of pelvis, initial encounter: Secondary | ICD-10-CM | POA: Diagnosis present

## 2024-03-08 DIAGNOSIS — I509 Heart failure, unspecified: Secondary | ICD-10-CM | POA: Diagnosis not present

## 2024-03-08 DIAGNOSIS — I130 Hypertensive heart and chronic kidney disease with heart failure and stage 1 through stage 4 chronic kidney disease, or unspecified chronic kidney disease: Secondary | ICD-10-CM | POA: Diagnosis not present

## 2024-03-08 DIAGNOSIS — S0993XA Unspecified injury of face, initial encounter: Secondary | ICD-10-CM | POA: Diagnosis not present

## 2024-03-08 NOTE — TOC Transition Note (Addendum)
 Transition of Care [facility]) - Discharge Note   Patient Details  Name: Virginia Walters MRN: [id number] Date of Birth: 1940-11-27  [facility]) CM/SW Contact:  Luann SHAUNNA Cumming, LCSW Phone Number: 03/08/2024, 1:20 PM   Clinical Narrative:     Family has met with [facility] and provided copay payment today. [facility] ready to admit pt.   Per MD patient ready for DC to [facility]. RN, patient, patient's family, and facility notified of DC. Discharge Summary and FL2 sent to facility. RN to call report prior to discharge [phone]). DC packet on chart. Ambulance transport requested for patient.   CSW will sign off for now as social work intervention is no longer needed. Please consult us  again if new needs arise.   Final next level of care: Skilled Nursing Facility Barriers to Discharge: No Barriers Identified        Discharge Placement              Patient chooses bed at: [facility] Patient to be transferred to facility by: [facility] Name of family member notified: Allen,Jennifer (Daughter)  [phone] (Mobile) Patient and family notified of of transfer: 03/08/24  Discharge Plan and Services Additional resources added to the After Visit Summary for   In-house Referral: Clinical Social Work   Post Acute Care Choice: Skilled Nursing Facility                               Social Drivers of Health (SDOH) Interventions SDOH Screenings   Food Insecurity: No Food Insecurity (02/28/2024)  Housing: Low Risk  (02/28/2024)  Transportation Needs: No Transportation Needs (02/28/2024)  Utilities: Not At Risk (02/28/2024)  Alcohol Screen: Low Risk  (05/25/2022)  Depression (PHQ2-9): Low Risk  (02/23/2024)  Financial Resource Strain: Low Risk  (05/25/2022)  Physical Activity: Inactive (05/25/2022)  Social Connections: Unknown (02/28/2024)  Stress: No Stress Concern Present (05/25/2022)  Tobacco Use: Medium Risk (02/27/2024)     Readmission Risk  Interventions    09/20/2021   12:40 PM  Readmission Risk Prevention Plan  Transportation Screening Complete  PCP or Specialist Appt within 5-7 Days Complete  Home Care Screening Complete  Medication Review (RN CM) Complete

## 2024-03-08 NOTE — Discharge Summary (Signed)
 Physician Discharge Summary  Virginia Walters FMW:[id number] DOB: 1941/06/09 DOA: 02/27/2024  PCP: Duanne Butler DASEN, MD  Admit date: 02/27/2024 Discharge date: 03/08/2024  Admitted From: Home Disposition: Skilled nursing facility  Recommendations for Outpatient Follow-up:  Follow up with PCP in 1-2 weeks Please obtain BMP/CBC/magnesium  in one week   Discharge Condition: Stable CODE STATUS: Full code Diet recommendation: Low-salt diet, nutritional supplements  Discharge summary: Patient is 83 year old female with history of hypertension, hyperlipidemia, COPD, heart failure with preserved ejection fraction, known ejection fraction of 60%, pulmonary embolism status post thrombectomy on Eliquis , recent alcohol use disorder, chronic back pain, neuropathy and prior GI bleeds presented with fall.  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  Frequent admission with dehydration.  Apparently, she was ready to work with physical therapy and took long time to come to the door , while at the door she got dizzy and fell.  Reportedly she had a blood pressure of 88 systolic on EMS arrival. Physical therapist visiting her at home called EMS to pick her up. At the emergency room, afebrile.  Blood pressure 107/31.  BUN 25, creatinine 2.16, alcohol level undetectable.  Chest x-ray normal.  Patient was given IV fluids, urine cultures collected and admitted with IV fluids and antibiotics and she is stabilized.   Assessment & Plan of care:    AKI on CKD stage IIIa: Baseline creatinine about 1.1.  Presented with creatinine of 2.16.  Probably prerenal with ongoing diarrhea, use of antihypertensive medications including lisinopril . Appropriately improving.  Urine output is adequate.   Now maintaining on oral nutrition. Needs recheck in 1 week.   Acute on chronic diarrhea: Likely noninfective.  No diarrhea since admission.  No recurrence of symptoms in  the hospital stay.   Hypokalemia: Replaced.  Adequate.   Syncope and collapse, suspect vasovagal or orthostatics: Discontinue lisinopril .  Monitor orthostatic symptoms.  Negative orthostatics.   History of PE on chronic anticoagulation: Continue Eliquis .  No evidence of bleeding.   UTI present on admission: Urine culture with E. coli.  Completed 7 days of antibiotics.   Essential hypertension: Blood pressure improving now and elevated.  Lisinopril  discontinued.  Started on amlodipine  and doxazosin .   Chronic medical issues including Diastolic congestive heart failure, euvolemic now.  Does not need any diuretics. COPD, stable.  On Breztri  and albuterol  nebs. GERD, on Protonix .   Medically stable to transfer to a skilled nursing facility.   Discharge Diagnoses:  Principal Problem:   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, stage 3a (HCC) Active Problems:   Urinary tract infection   Essential hypertension   Diarrhea   Fall at home, initial encounter   Mild cognitive impairment   Chronic diastolic CHF (congestive heart failure) (HCC)   COPD (chronic obstructive pulmonary disease) (HCC)   Alcohol use disorder   GERD (gastroesophageal reflux disease)   Obesity (BMI 30-39.9)   Syncope and collapse    Discharge Instructions  Discharge Instructions     Diet - low sodium heart healthy   Complete by: As directed    Increase activity slowly   Complete by: As directed       Allergies as of 03/08/2024       Reactions   Ibuprofen     Oxycodone  Itching        Medication List     STOP taking these medications    lisinopril  40 MG tablet Commonly known as: ZESTRIL   TAKE these medications    acetaminophen  325 MG tablet Commonly known as: TYLENOL  Take 2 tablets (650 mg total) by mouth every 6 (six) hours as needed for mild pain (pain score 1-3), fever or headache (or Fever >/= 101).   albuterol  108 (90 Base) MCG/ACT inhaler Commonly known as: VENTOLIN   HFA Inhale 2 puffs into the lungs every 6 (six) hours as needed for wheezing or shortness of breath.   amLODipine  5 MG tablet Commonly known as: NORVASC  Take 1 tablet (5 mg total) by mouth daily.   apixaban  5 MG Tabs tablet Commonly known as: ELIQUIS  Take 1 tablet (5 mg total) by mouth 2 (two) times daily.   Breztri  Aerosphere 160-9-4.8 MCG/ACT Aero inhaler Generic drug: budesonide -glycopyrrolate -formoterol  USE 2 INHALATIONS BY MOUTH INTO  THE LUNGS IN THE MORNING AND AT  BEDTIME   dorzolamide  2 % ophthalmic solution Commonly known as: TRUSOPT  Place 1 drop into the left eye 2 (two) times daily.   doxazosin  4 MG tablet Commonly known as: CARDURA  TAKE ONE-HALF TABLET BY MOUTH  DAILY   fish oil-omega-3 fatty acids  1000 MG capsule Take 1,000 mg by mouth daily.   multivitamin tablet Take 1 tablet by mouth daily.   pantoprazole  40 MG tablet Commonly known as: Protonix  Take 1 tablet (40 mg total) by mouth daily.   pravastatin  40 MG tablet Commonly known as: PRAVACHOL  Take 1 tablet (40 mg total) by mouth daily. 09/29/23-PT NEEDS APPOINTMENT   solifenacin  10 MG tablet Commonly known as: VESICARE  Take 1 tablet (10 mg total) by mouth daily.   thiamine  100 MG tablet Commonly known as: Vitamin B-1 Take 1 tablet (100 mg total) by mouth daily.   traZODone  50 MG tablet Commonly known as: DESYREL  TAKE 1 TABLET BY MOUTH AT  BEDTIME   VITAMIN C  PO Take 1 tablet by mouth daily.        Contact information for after-discharge care     Destination     [facility] .   Service: Skilled Nursing Contact information: [address] [place]  [address] [phone]                    Allergies  Allergen Reactions   Ibuprofen     Oxycodone  Itching    Consultations: None   Procedures/Studies: US  RENAL Result Date: 02/27/2024 CLINICAL DATA:  Acute kidney injury EXAM: RENAL / URINARY  TRACT ULTRASOUND COMPLETE COMPARISON:  10/07/2021 FINDINGS: Right Kidney: Renal measurements: 10.8 x 5.7 x 4.7 cm = volume: 150 mL. 5 x 4.8 x 4.9 cm cortical cyst exophytic from mid kidney. Parenchyma mildly hypoechoic compared to adjacent liver. No hydronephrosis. Left Kidney: Renal measurements: 10.7 x 5 x 4.3 cm = volume: 120 mL. Cortical thinning. Normal cortical echotexture. No hydronephrosis. 0.9 x 0.8 cm probable cortical cyst. Bladder: Nondistended Other: Technologist describes technically difficult study secondary to body habitus and gas. IMPRESSION: 1. No hydronephrosis. 2. Bilateral renal cysts. Electronically Signed   By: JONETTA Faes M.D.   On: 02/27/2024 16:31   DG Chest Port 1 View Result Date: 02/27/2024 CLINICAL DATA:  Syncope EXAM: PORTABLE CHEST 1 VIEW COMPARISON:  December 08, 2023 FINDINGS: Chronic appearing bilateral interstitial changes. No focal consolidation. No pleural effusions. No pneumothorax. Cardiomediastinal silhouette is unchanged. Hiatal hernia. No acute osseous findings. IMPRESSION: No active disease.  Likely chronic interstitial changes. Electronically Signed   By: Michaeline Blanch M.D.   On: 02/27/2024 14:27   (Echo, Carotid, EGD, Colonoscopy,  ERCP)    Subjective: Patient seen and examined.  No overnight events.  Remains fairly stable.  No more diarrhea.    Discharge Exam: Vitals:   03/07/24 2014 03/08/24 0412  BP: (!) 171/54 129/68  Pulse: 65 79  Resp: 18 18  Temp: 98.7 F (37.1 C) 98.3 F (36.8 C)  SpO2: 93% 96%   Vitals:   03/07/24 1722 03/07/24 2013 03/07/24 2014 03/08/24 0412  BP: (!) 154/77  (!) 171/54 129/68  Pulse: 81  65 79  Resp: 19  18 18   Temp: 98 F (36.7 C)  98.7 F (37.1 C) 98.3 F (36.8 C)  TempSrc: Oral   Oral  SpO2: 96% 94% 93% 96%  Weight:      Height:        General: Pt is alert, awake, not in acute distress Alert awake and oriented.  Generalized weakness but no focal deficit. Cardiovascular: RRR, S1/S2 +, no rubs, no  gallops Respiratory: CTA bilaterally, no wheezing, no rhonchi Abdominal: Soft, NT, ND, bowel sounds + Extremities: no edema, no cyanosis    The results of significant diagnostics from this hospitalization (including imaging, microbiology, ancillary and laboratory) are listed below for reference.     Microbiology: Recent Results (from the past 240 hours)  Urine Culture (for pregnant, neutropenic or urologic patients or patients with an indwelling urinary catheter)     Status: Abnormal   Collection Time: 02/27/24  3:29 PM   Specimen: Urine, Clean Catch  Result Value Ref Range Status   Specimen Description URINE, CLEAN CATCH  Final   Special Requests NONE  Final   Culture (A)  Final    >=100,000 COLONIES/mL ESCHERICHIA COLI Two isolates with different morphologies were identified as the same organism.The most resistant organism was reported. Performed at [facility], [address], [place], [place] [address]    Report Status 03/01/2024 FINAL  Final   Organism ID, Bacteria ESCHERICHIA COLI (A)  Final      Susceptibility   Escherichia coli - MIC*    AMPICILLIN >=32 RESISTANT Resistant     CEFAZOLIN  <=4 SENSITIVE Sensitive     CEFEPIME <=0.12 SENSITIVE Sensitive     CEFTRIAXONE  <=0.25 SENSITIVE Sensitive     CIPROFLOXACIN <=0.25 SENSITIVE Sensitive     GENTAMICIN <=1 SENSITIVE Sensitive     IMIPENEM <=0.25 SENSITIVE Sensitive     NITROFURANTOIN  <=16 SENSITIVE Sensitive     TRIMETH/SULFA <=20 SENSITIVE Sensitive     AMPICILLIN/SULBACTAM >=32 RESISTANT Resistant     PIP/TAZO <=4 SENSITIVE Sensitive ug/mL    * >=100,000 COLONIES/mL ESCHERICHIA COLI     Labs: BNP (last 3 results) Recent Labs    12/08/23 0411  BNP 55.0   Basic Metabolic Panel: No results for input(s): NA, K, CL, CO2, GLUCOSE, BUN, CREATININE, CALCIUM , MG, PHOS in the last 168 hours.  Liver Function Tests: No results for input(s): AST, ALT, ALKPHOS, BILITOT, PROT,  ALBUMIN  in the last 168 hours. No results for input(s): LIPASE, AMYLASE in the last 168 hours. No results for input(s): AMMONIA in the last 168 hours. CBC: No results for input(s): WBC, NEUTROABS, HGB, HCT, MCV, PLT in the last 168 hours. Cardiac Enzymes: No results for input(s): CKTOTAL, CKMB, CKMBINDEX, TROPONINI in the last 168 hours. BNP: Invalid input(s): POCBNP CBG: Recent Labs  Lab 03/04/24 2133  GLUCAP 91   D-Dimer No results for input(s): DDIMER in the last 72 hours. Hgb A1c No results for input(s): HGBA1C in the last 72 hours. Lipid Profile  No results for input(s): CHOL, HDL, LDLCALC, TRIG, CHOLHDL, LDLDIRECT in the last 72 hours. Thyroid  function studies No results for input(s): TSH, T4TOTAL, T3FREE, THYROIDAB in the last 72 hours.  Invalid input(s): FREET3 Anemia work up No results for input(s): VITAMINB12, FOLATE, FERRITIN, TIBC, IRON , RETICCTPCT in the last 72 hours. Urinalysis    Component Value Date/Time   COLORURINE AMBER (A) 02/27/2024 1529   APPEARANCEUR CLOUDY (A) 02/27/2024 1529   LABSPEC 1.020 02/27/2024 1529   PHURINE 5.0 02/27/2024 1529   GLUCOSEU NEGATIVE 02/27/2024 1529   HGBUR NEGATIVE 02/27/2024 1529   BILIRUBINUR NEGATIVE 02/27/2024 1529   KETONESUR NEGATIVE 02/27/2024 1529   PROTEINUR 100 (A) 02/27/2024 1529   NITRITE NEGATIVE 02/27/2024 1529   LEUKOCYTESUR MODERATE (A) 02/27/2024 1529   Sepsis Labs No results for input(s): WBC in the last 168 hours.  Invalid input(s): PROCALCITONIN, LACTICIDVEN Microbiology Recent Results (from the past 240 hours)  Urine Culture (for pregnant, neutropenic or urologic patients or patients with an indwelling urinary catheter)     Status: Abnormal   Collection Time: 02/27/24  3:29 PM   Specimen: Urine, Clean Catch  Result Value Ref Range Status   Specimen Description URINE, CLEAN CATCH  Final   Special Requests NONE  Final    Culture (A)  Final    >=100,000 COLONIES/mL ESCHERICHIA COLI Two isolates with different morphologies were identified as the same organism.The most resistant organism was reported. Performed at [facility], [address], [place], [place] [address]    Report Status 03/01/2024 FINAL  Final   Organism ID, Bacteria ESCHERICHIA COLI (A)  Final      Susceptibility   Escherichia coli - MIC*    AMPICILLIN >=32 RESISTANT Resistant     CEFAZOLIN  <=4 SENSITIVE Sensitive     CEFEPIME <=0.12 SENSITIVE Sensitive     CEFTRIAXONE  <=0.25 SENSITIVE Sensitive     CIPROFLOXACIN <=0.25 SENSITIVE Sensitive     GENTAMICIN <=1 SENSITIVE Sensitive     IMIPENEM <=0.25 SENSITIVE Sensitive     NITROFURANTOIN  <=16 SENSITIVE Sensitive     TRIMETH/SULFA <=20 SENSITIVE Sensitive     AMPICILLIN/SULBACTAM >=32 RESISTANT Resistant     PIP/TAZO <=4 SENSITIVE Sensitive ug/mL    * >=100,000 COLONIES/mL ESCHERICHIA COLI     Time coordinating discharge: 35 minutes  SIGNED:   Renato Applebaum, MD  [facility] 03/08/2024, 8:33 AM

## 2024-03-08 NOTE — Progress Notes (Signed)
 PROGRESS NOTE    Virginia Walters  FMW:[id number] DOB: 09-10-1940 DOA: 02/27/2024 PCP: Duanne Butler DASEN, MD    Brief Narrative:  Patient is 83 year old female with history of hypertension, hyperlipidemia, COPD, heart failure with preserved ejection fraction, known ejection fraction of 60%, pulmonary embolism status post thrombectomy on Eliquis , recent alcohol use disorder, chronic back pain, neuropathy and prior GI bleeds presented with fall.  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  Frequent admission with dehydration.  Apparently, she was ready to work with physical therapy and took long time to come to the door , while at the door she got dizzy and fell.  Reportedly she had a blood pressure of 88 systolic on EMS arrival. Physical therapist visiting her at home called EMS to pick her up. At the emergency room, afebrile.  Blood pressure 107/31.  BUN 25, creatinine 2.16, alcohol level undetectable.  Chest x-ray normal.  Patient was given IV fluids, urine cultures collected and admitted with IV fluids and antibiotics.   Subjective: Seen and examined.  No overnight events.  Could not go to SNF yesterday because of some payment issues.  It is taken care of now.  She is hoping to go to [facility] today.  Assessment & Plan:   AKI on CKD stage IIIa: Baseline creatinine about 1.1.  Presented with creatinine of 2.16.  Probably prerenal with ongoing diarrhea, use of antihypertensive medications including lisinopril . Appropriately improving.  Urine output is adequate.   Now maintaining on oral nutrition.  Acute on chronic diarrhea: Likely noninfective.  No diarrhea since admission.  Discontinue precautions.  Symptomatic management.  Hypokalemia: Replaced.  Adequate.  Syncope and collapse, suspect vasovagal or orthostatics: Discontinue lisinopril .  Monitor orthostatic symptoms.  History of PE on chronic anticoagulation: Continue  Eliquis .  No evidence of bleeding.  UTI present on admission: Urine culture growing E. coli.  Rocephin  day 4.  change to oral antibiotics to complete 7 days of therapy.  Essential hypertension: Blood pressure improving now and elevated.  Lisinopril  discontinued.  Started on amlodipine .  Chronic medical issues including Diastolic congestive heart failure, euvolemic today.   COPD, stable.  On Breztri  and albuterol  nebs. GERD, on Protonix .  Transfer to SNF today.   DVT prophylaxis:  apixaban  (ELIQUIS ) tablet 5 mg   Code Status: Full code Family Communication: None today. Disposition Plan: Status is: Medically stable.  Possible discharge today.   Consultants:  None  Procedures:  None  Antimicrobials:  Rocephin  7/15--7/19 Keflex  7/19--completed.     Objective: Vitals:   03/07/24 1722 03/07/24 2013 03/07/24 2014 03/08/24 0412  BP: (!) 154/77  (!) 171/54 129/68  Pulse: 81  65 79  Resp: 19  18 18   Temp: 98 F (36.7 C)  98.7 F (37.1 C) 98.3 F (36.8 C)  TempSrc: Oral   Oral  SpO2: 96% 94% 93% 96%  Weight:      Height:        Intake/Output Summary (Last 24 hours) at 03/08/2024 0833 Last data filed at 03/07/2024 1300 Gross per 24 hour  Intake 120 ml  Output --  Net 120 ml   Filed Weights   03/01/24 0339 03/04/24 0500 03/06/24 0740  Weight: 93.1 kg 92.8 kg 92 kg    Examination:  Looks fairly comfortable.  Interactive and pleasant.   Data Reviewed: I have personally reviewed following labs and imaging studies  CBC: No results for input(s): WBC, NEUTROABS, HGB, HCT, MCV, PLT in  the last 168 hours.  Basic Metabolic Panel: No results for input(s): NA, K, CL, CO2, GLUCOSE, BUN, CREATININE, CALCIUM , MG, PHOS in the last 168 hours.  GFR: Estimated Creatinine Clearance: 43.2 mL/min (A) (by C-G formula based on SCr of 1.17 mg/dL (H)). Liver Function Tests: No results for input(s): AST, ALT, ALKPHOS, BILITOT, PROT,  ALBUMIN  in the last 168 hours.  No results for input(s): LIPASE, AMYLASE in the last 168 hours. No results for input(s): AMMONIA in the last 168 hours. Coagulation Profile: No results for input(s): INR, PROTIME in the last 168 hours. Cardiac Enzymes: No results for input(s): CKTOTAL, CKMB, CKMBINDEX, TROPONINI in the last 168 hours. BNP (last 3 results) No results for input(s): PROBNP in the last 8760 hours. HbA1C: No results for input(s): HGBA1C in the last 72 hours. CBG: Recent Labs  Lab 03/04/24 2133  GLUCAP 91   Lipid Profile: No results for input(s): CHOL, HDL, LDLCALC, TRIG, CHOLHDL, LDLDIRECT in the last 72 hours. Thyroid  Function Tests: No results for input(s): TSH, T4TOTAL, FREET4, T3FREE, THYROIDAB in the last 72 hours. Anemia Panel: No results for input(s): VITAMINB12, FOLATE, FERRITIN, TIBC, IRON , RETICCTPCT in the last 72 hours. Sepsis Labs: No results for input(s): PROCALCITON, LATICACIDVEN in the last 168 hours.  Recent Results (from the past 240 hours)  Urine Culture (for pregnant, neutropenic or urologic patients or patients with an indwelling urinary catheter)     Status: Abnormal   Collection Time: 02/27/24  3:29 PM   Specimen: Urine, Clean Catch  Result Value Ref Range Status   Specimen Description URINE, CLEAN CATCH  Final   Special Requests NONE  Final   Culture (A)  Final    >=100,000 COLONIES/mL ESCHERICHIA COLI Two isolates with different morphologies were identified as the same organism.The most resistant organism was reported. Performed at [facility], [address], [place], [place] [address]    Report Status 03/01/2024 FINAL  Final   Organism ID, Bacteria ESCHERICHIA COLI (A)  Final      Susceptibility   Escherichia coli - MIC*    AMPICILLIN >=32 RESISTANT Resistant     CEFAZOLIN  <=4 SENSITIVE Sensitive     CEFEPIME <=0.12 SENSITIVE Sensitive     CEFTRIAXONE  <=0.25  SENSITIVE Sensitive     CIPROFLOXACIN <=0.25 SENSITIVE Sensitive     GENTAMICIN <=1 SENSITIVE Sensitive     IMIPENEM <=0.25 SENSITIVE Sensitive     NITROFURANTOIN  <=16 SENSITIVE Sensitive     TRIMETH/SULFA <=20 SENSITIVE Sensitive     AMPICILLIN/SULBACTAM >=32 RESISTANT Resistant     PIP/TAZO <=4 SENSITIVE Sensitive ug/mL    * >=100,000 COLONIES/mL ESCHERICHIA COLI         Radiology Studies: No results found.       Scheduled Meds:  amLODipine   5 mg Oral Daily   apixaban   5 mg Oral BID   budesonide -glycopyrrolate -formoterol   2 puff Inhalation BID   dorzolamide   1 drop Left Eye BID   doxazosin   2 mg Oral Daily   fesoterodine   4 mg Oral Daily   Gerhardt's butt cream   Topical BID   pantoprazole   40 mg Oral Daily   sodium chloride  flush  3 mL Intravenous Q12H   thiamine   100 mg Oral Daily   traZODone   50 mg Oral QHS   Continuous Infusions:     LOS: 9 days    Time spent: 25 minutes    Renato Applebaum, MD [facility]

## 2024-03-08 NOTE — Progress Notes (Signed)
 Physical Therapy Treatment Patient Details Name: Virginia Walters MRN: [id number] DOB: 01-May-1941 Today's Date: 03/08/2024   History of Present Illness Pt is an 83 y.o. female presenting to hospital 12/01/23 after being found down in her yard by EMS after possible syncopal episode; incontinent of stool and urine; pt was doing colonoscopy prep.  Pt admitted with UTI, possible syncope, acute metabolic encephalopathy, AKI, symptomatic anemia.  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  Recent hospitalization 4/1 to 11/17/23 with symptomatic anemia requiring 3 units PRBC's complicated by acute encephalopathy.    PT Comments  Patient is agreeable to PT session and cooperative throughout. The patient continues to require assistance with mobility. Increased ambulation distance this session with standing activity tolerance limited by fatigue. Recommend rehabilitation < 3 hours/day after this hospital stay. PT will continue to follow.    If plan is discharge home, recommend the following: A little help with walking and/or transfers;Assistance with cooking/housework;Assist for transportation;Help with stairs or ramp for entrance;Supervision due to cognitive status   Can travel by private vehicle     No  Equipment Recommendations  None recommended by PT    Recommendations for Other Services       Precautions / Restrictions Precautions Precautions: Fall Recall of Precautions/Restrictions: Impaired Restrictions Weight Bearing Restrictions Per Provider Order: No     Mobility  Bed Mobility Overal bed mobility: Needs Assistance Bed Mobility: Supine to Sit, Sit to Supine     Supine to sit: Min assist Sit to supine: Min assist   General bed mobility comments: assist for trunk to sit upright, assistance for LE to return to bed. increased time and effort required    Transfers Overall  transfer level: Needs assistance Equipment used: Rolling walker (2 wheels) Transfers: Sit to/from Stand Sit to Stand: Contact guard assist           General transfer comment: cues for hand placement for safety    Ambulation/Gait Ambulation/Gait assistance: Contact guard assist Gait Distance (Feet): 75 Feet Assistive device: Rolling walker (2 wheels) Gait Pattern/deviations: Step-through pattern Gait velocity: decreased     General Gait Details: reinforced to use rolling walker for safety and fall prevention. activity tolerance limited by fatigue. cues for taking rest breaks as needed for energy conservation   Stairs             Wheelchair Mobility     Tilt Bed    Modified Rankin (Stroke Patients Only)       Balance Overall balance assessment: Needs assistance Sitting-balance support: No upper extremity supported, Feet unsupported, Bilateral upper extremity supported, Single extremity supported Sitting balance-Leahy Scale: Fair     Standing balance support: Bilateral upper extremity supported Standing balance-Leahy Scale: Poor Standing balance comment: relying heavily on rolling walker for support in standing                            Communication Communication Communication: No apparent difficulties  Cognition Arousal: Alert Behavior During Therapy: WFL for tasks assessed/performed   PT - Cognitive impairments: Problem solving, Safety/Judgement, Awareness, Memory, Attention, Orientation                       PT - Cognition Comments: decreased overall awareness of deficits Following commands: Impaired      Cueing Cueing Techniques: Verbal cues, Gestural cues  Exercises  General Comments        Pertinent Vitals/Pain Pain Assessment Pain Assessment: No/denies pain    Home Living                          Prior Function            PT Goals (current goals can now be found in the care plan section) Acute  Rehab PT Goals Patient Stated Goal: rehab then home PT Goal Formulation: With patient Time For Goal Achievement: 03/13/24 Potential to Achieve Goals: Fair Progress towards PT goals: Progressing toward goals    Frequency    Min 2X/week      PT Plan      Co-evaluation              AM-PAC PT 6 Clicks Mobility   Outcome Measure  Help needed turning from your back to your side while in a flat bed without using bedrails?: A Little Help needed moving from lying on your back to sitting on the side of a flat bed without using bedrails?: A Lot Help needed moving to and from a bed to a chair (including a wheelchair)?: A Lot Help needed standing up from a chair using your arms (e.g., wheelchair or bedside chair)?: A Lot Help needed to walk in hospital room?: A Lot Help needed climbing 3-5 steps with a railing? : A Lot 6 Click Score: 13    End of Session   Activity Tolerance: Patient tolerated treatment well Patient left: in bed;with call bell/phone within reach;with bed alarm set Nurse Communication: Mobility status PT Visit Diagnosis: Unsteadiness on feet (R26.81);Muscle weakness (generalized) (M62.81);Difficulty in walking, not elsewhere classified (R26.2)     Time: 9163-9146 PT Time Calculation (min) (ACUTE ONLY): 17 min  Charges:    $Therapeutic Activity: 8-22 mins PT General Charges $$ ACUTE PT VISIT: 1 Visit                    Randine Essex, PT, MPT    Randine LULLA Essex 03/08/2024, 10:59 AM

## 2024-03-11 DIAGNOSIS — N1831 Chronic kidney disease, stage 3a: Secondary | ICD-10-CM | POA: Diagnosis not present

## 2024-03-11 DIAGNOSIS — Z86711 Personal history of pulmonary embolism: Secondary | ICD-10-CM | POA: Diagnosis not present

## 2024-03-11 DIAGNOSIS — E876 Hypokalemia: Secondary | ICD-10-CM | POA: Diagnosis not present

## 2024-03-11 DIAGNOSIS — N179 Acute kidney failure, unspecified: Secondary | ICD-10-CM | POA: Diagnosis not present

## 2024-03-11 DIAGNOSIS — Z7901 Long term (current) use of anticoagulants: Secondary | ICD-10-CM | POA: Diagnosis not present

## 2024-03-11 DIAGNOSIS — I959 Hypotension, unspecified: Secondary | ICD-10-CM | POA: Diagnosis not present

## 2024-03-13 ENCOUNTER — Other Ambulatory Visit: Payer: Self-pay

## 2024-03-13 ENCOUNTER — Emergency Department
Admission: EM | Admit: 2024-03-13 | Discharge: 2024-03-13 | Disposition: A | Discharge status: Home / Self Care | Source: Skilled Nursing Facility | Attending: Emergency Medicine | Admitting: Emergency Medicine

## 2024-03-13 ENCOUNTER — Emergency Department

## 2024-03-13 DIAGNOSIS — I110 Hypertensive heart disease with heart failure: Secondary | ICD-10-CM | POA: Diagnosis not present

## 2024-03-13 DIAGNOSIS — S0993XA Unspecified injury of face, initial encounter: Secondary | ICD-10-CM | POA: Diagnosis not present

## 2024-03-13 DIAGNOSIS — N281 Cyst of kidney, acquired: Secondary | ICD-10-CM | POA: Diagnosis not present

## 2024-03-13 DIAGNOSIS — J449 Chronic obstructive pulmonary disease, unspecified: Secondary | ICD-10-CM | POA: Insufficient documentation

## 2024-03-13 DIAGNOSIS — W1839XA Other fall on same level, initial encounter: Secondary | ICD-10-CM | POA: Diagnosis not present

## 2024-03-13 DIAGNOSIS — M47816 Spondylosis without myelopathy or radiculopathy, lumbar region: Secondary | ICD-10-CM | POA: Diagnosis not present

## 2024-03-13 DIAGNOSIS — E876 Hypokalemia: Secondary | ICD-10-CM | POA: Diagnosis not present

## 2024-03-13 DIAGNOSIS — I6782 Cerebral ischemia: Secondary | ICD-10-CM | POA: Insufficient documentation

## 2024-03-13 DIAGNOSIS — M5030 Other cervical disc degeneration, unspecified cervical region: Secondary | ICD-10-CM | POA: Diagnosis not present

## 2024-03-13 DIAGNOSIS — N179 Acute kidney failure, unspecified: Secondary | ICD-10-CM | POA: Diagnosis not present

## 2024-03-13 DIAGNOSIS — I509 Heart failure, unspecified: Secondary | ICD-10-CM | POA: Diagnosis not present

## 2024-03-13 DIAGNOSIS — M4802 Spinal stenosis, cervical region: Secondary | ICD-10-CM | POA: Diagnosis not present

## 2024-03-13 DIAGNOSIS — S329XXA Fracture of unspecified parts of lumbosacral spine and pelvis, initial encounter for closed fracture: Secondary | ICD-10-CM | POA: Diagnosis not present

## 2024-03-13 DIAGNOSIS — M47812 Spondylosis without myelopathy or radiculopathy, cervical region: Secondary | ICD-10-CM | POA: Diagnosis not present

## 2024-03-13 DIAGNOSIS — M79605 Pain in left leg: Secondary | ICD-10-CM | POA: Diagnosis not present

## 2024-03-13 DIAGNOSIS — W19XXXD Unspecified fall, subsequent encounter: Secondary | ICD-10-CM | POA: Diagnosis not present

## 2024-03-13 DIAGNOSIS — I130 Hypertensive heart and chronic kidney disease with heart failure and stage 1 through stage 4 chronic kidney disease, or unspecified chronic kidney disease: Secondary | ICD-10-CM | POA: Diagnosis not present

## 2024-03-13 DIAGNOSIS — K5730 Diverticulosis of large intestine without perforation or abscess without bleeding: Secondary | ICD-10-CM | POA: Insufficient documentation

## 2024-03-13 DIAGNOSIS — K5900 Constipation, unspecified: Secondary | ICD-10-CM | POA: Diagnosis not present

## 2024-03-13 DIAGNOSIS — M25552 Pain in left hip: Secondary | ICD-10-CM | POA: Insufficient documentation

## 2024-03-13 DIAGNOSIS — N183 Chronic kidney disease, stage 3 unspecified: Secondary | ICD-10-CM | POA: Diagnosis not present

## 2024-03-13 DIAGNOSIS — M160 Bilateral primary osteoarthritis of hip: Secondary | ICD-10-CM | POA: Diagnosis not present

## 2024-03-13 DIAGNOSIS — Z7901 Long term (current) use of anticoagulants: Secondary | ICD-10-CM | POA: Insufficient documentation

## 2024-03-13 DIAGNOSIS — S199XXA Unspecified injury of neck, initial encounter: Secondary | ICD-10-CM | POA: Diagnosis not present

## 2024-03-13 DIAGNOSIS — I6529 Occlusion and stenosis of unspecified carotid artery: Secondary | ICD-10-CM | POA: Diagnosis not present

## 2024-03-13 DIAGNOSIS — R55 Syncope and collapse: Secondary | ICD-10-CM | POA: Diagnosis not present

## 2024-03-13 DIAGNOSIS — K449 Diaphragmatic hernia without obstruction or gangrene: Secondary | ICD-10-CM | POA: Diagnosis not present

## 2024-03-13 DIAGNOSIS — S3993XA Unspecified injury of pelvis, initial encounter: Secondary | ICD-10-CM | POA: Diagnosis present

## 2024-03-13 DIAGNOSIS — Z86711 Personal history of pulmonary embolism: Secondary | ICD-10-CM | POA: Diagnosis not present

## 2024-03-13 DIAGNOSIS — S32512A Fracture of superior rim of left pubis, initial encounter for closed fracture: Secondary | ICD-10-CM | POA: Diagnosis not present

## 2024-03-13 DIAGNOSIS — K219 Gastro-esophageal reflux disease without esophagitis: Secondary | ICD-10-CM | POA: Diagnosis not present

## 2024-03-13 DIAGNOSIS — K5750 Diverticulosis of both small and large intestine without perforation or abscess without bleeding: Secondary | ICD-10-CM | POA: Diagnosis not present

## 2024-03-13 LAB — COMPREHENSIVE METABOLIC PANEL WITH GFR
ALT: 20 U/L (ref 0–44)
AST: 19 U/L (ref 15–41)
Albumin: 3.3 g/dL — ABNORMAL LOW (ref 3.5–5.0)
Alkaline Phosphatase: 55 U/L (ref 38–126)
Anion gap: 8 (ref 5–15)
BUN: 19 mg/dL (ref 8–23)
CO2: 23 mmol/L (ref 22–32)
Calcium: 9.6 mg/dL (ref 8.9–10.3)
Chloride: 108 mmol/L (ref 98–111)
Creatinine, Ser: 1.25 mg/dL — ABNORMAL HIGH (ref 0.44–1.00)
GFR, Estimated: 43 mL/min — ABNORMAL LOW (ref >60)
Glucose, Bld: 105 mg/dL — ABNORMAL HIGH (ref 70–99)
Potassium: 4 mmol/L (ref 3.5–5.1)
Sodium: 139 mmol/L (ref 135–145)
Total Bilirubin: 0.6 mg/dL (ref 0.0–1.2)
Total Protein: 6.3 g/dL — ABNORMAL LOW (ref 6.5–8.1)

## 2024-03-13 LAB — CBC WITH DIFFERENTIAL/PLATELET
Abs Immature Granulocytes: 0.04 K/uL (ref 0.00–0.07)
Basophils Absolute: 0.1 K/uL (ref 0.0–0.1)
Basophils Relative: 1 %
Eosinophils Absolute: 0.4 K/uL (ref 0.0–0.5)
Eosinophils Relative: 5 %
HCT: 38.2 % (ref 36.0–46.0)
Hemoglobin: 12.6 g/dL (ref 12.0–15.0)
Immature Granulocytes: 0 %
Lymphocytes Relative: 13 %
Lymphs Abs: 1.2 K/uL (ref 0.7–4.0)
MCH: 29.2 pg (ref 26.0–34.0)
MCHC: 33 g/dL (ref 30.0–36.0)
MCV: 88.6 fL (ref 80.0–100.0)
Monocytes Absolute: 0.8 K/uL (ref 0.1–1.0)
Monocytes Relative: 9 %
Neutro Abs: 6.7 K/uL (ref 1.7–7.7)
Neutrophils Relative %: 72 %
Platelets: 187 K/uL (ref 150–400)
RBC: 4.31 MIL/uL (ref 3.87–5.11)
RDW: 15.8 % — ABNORMAL HIGH (ref 11.5–15.5)
WBC: 9.2 K/uL (ref 4.0–10.5)
nRBC: 0 % (ref 0.0–0.2)

## 2024-03-13 LAB — LIPASE, BLOOD: Lipase: 35 U/L (ref 11–51)

## 2024-03-13 MED ORDER — LIDOCAINE 5 % EX PTCH: 1.0000 | MEDICATED_PATCH | CUTANEOUS | 0 refills | Status: AC

## 2024-03-13 MED ORDER — ACETAMINOPHEN 500 MG PO TABS
1000.0000 mg | ORAL_TABLET | Freq: Once | ORAL | Status: AC
  Administered 2024-03-13: 1000 mg via ORAL
  Filled 2024-03-13: qty 2

## 2024-03-13 MED ORDER — LIDOCAINE 5 % EX PTCH
1.0000 | MEDICATED_PATCH | CUTANEOUS | Status: DC
  Administered 2024-03-13: 1 via TRANSDERMAL
  Filled 2024-03-13: qty 1

## 2024-03-13 NOTE — Discharge Instructions (Addendum)
 Take Tylenol  1 g every 8 hours with the lidocaine  patches to help with pain use a walker and weightbearing as tolerated work with physical therapy to help with these pelvic fractures although I discussed with orthopedics and these are nonoperative.  She can use some MiraLAX  to help with constipation but there was no CT evidence of obstruction.   IMPRESSION: 1. Acute appearing nondisplaced fractures of the left superior and inferior pubic rami. 2. Descending and sigmoid colonic diverticulosis without evidence of acute diverticulitis 3. Moderate hiatal hernia. 4. Subtle patchy nodular tree-in-bud opacities at the peripheral right-greater-than-left lung bases, may reflect an infectious/inflammatory etiology such as small airways disease. 5.  Aortic Atherosclerosis (ICD10-I70.0).

## 2024-03-13 NOTE — ED Notes (Signed)
 Pt waiting on [facility] for transport to [facility]

## 2024-03-13 NOTE — ED Triage Notes (Signed)
 Patient had unwitnessed fall at [facility]; having pain to left hip and also reports constipation. Last BM 03/07/2024.

## 2024-03-13 NOTE — ED Notes (Signed)
 Patients brief saturated in urine upon arrival to room. Patient cleaned and brief changed.

## 2024-03-13 NOTE — ED Notes (Signed)
 Pt brief soiled of urine. This tech and Architect changed pt into a new brief and placed a chux pad underneath pt.

## 2024-03-13 NOTE — ED Triage Notes (Signed)
 First nurse note: Pt here via [facility] from [facility], pt had unwitnessed fall on Monday, pt has L hip pain. Pt not able to bear weight on L side, pt has been walking on this hip since Monday.    135/65 HR: 68 96% RA  Pt does take Eliquis  but did not hit her head.

## 2024-03-13 NOTE — ED Notes (Signed)
 Patient given water

## 2024-03-13 NOTE — ED Notes (Signed)
 This tech and NT Darryl into room to ambulate pt. Pt able to stand on both feet with assistance and no pain. Pt was able to step with her left foot but was not able to step with her right foot due to putting pressure on her left foot which causes her pain.

## 2024-03-13 NOTE — ED Notes (Signed)
 Called [facility] for transport.

## 2024-03-13 NOTE — ED Provider Notes (Signed)
 [facility] Provider Note    Event Date/Time   First MD Initiated Contact with Patient 03/13/24 1154     (approximate)   History   Fall and Constipation   HPI  Virginia Walters is a 83 y.o. female with hypertension, hyperlipidemia, COPD, heart failure, PE on Eliquis  who comes in for a fall.  Patient reports that she had a fall on Monday.  She reports try to get up out of her wheelchair when her feet got caught on the spots where her feet were sitting and she had a mechanical fall.  She states that she did not hit her head but she is on Eliquis .  She reports left hip pain from the fall.  Patient also reports having difficulties having a bowel movements.  She reported that somebody tried to stick a finger up her bottom and it did not help her have a bowel movement.  She really denies any abdominal pain.  I reviewed a note where patient was discharged on 03/08/2024 where patient had syncopal episode, completed a 7-day course for UTI and was having acute on chronic diarrhea.   Physical Exam   Triage Vital Signs: ED Triage Vitals  Encounter Vitals Group     BP 03/13/24 1130 132/61     Girls Systolic BP Percentile --      Girls Diastolic BP Percentile --      Boys Systolic BP Percentile --      Boys Diastolic BP Percentile --      Pulse Rate 03/13/24 1130 70     Resp 03/13/24 1130 18     Temp 03/13/24 1130 97.8 F (36.6 C)     Temp Source 03/13/24 1130 Oral     SpO2 03/13/24 1130 97 %     Weight 03/13/24 1129 203 lb (92.1 kg)     Height 03/13/24 1129 5' 8 (1.727 m)     Head Circumference --      Peak Flow --      Pain Score 03/13/24 1129 9     Pain Loc --      Pain Education --      Exclude from Growth Chart --     Most recent vital signs: Vitals:   03/13/24 1130  BP: 132/61  Pulse: 70  Resp: 18  Temp: 97.8 F (36.6 C)  SpO2: 97%     General: Awake, no distress.  CV:  Good peripheral perfusion.  Resp:  Normal effort.  Abd:  No  distention.  Soft and nontender.  Left hip pain.  Difficulty lifting up the leg secondary to pain.  Good distal pulse.  No chest wall pain. Other:  No c spine tenderness    ED Results / Procedures / Treatments   Labs (all labs ordered are listed, but only abnormal results are displayed) Labs Reviewed  CBC WITH DIFFERENTIAL/PLATELET  COMPREHENSIVE METABOLIC PANEL WITH GFR  LIPASE, BLOOD     RADIOLOGY I have reviewed the xray personally and interpreted no obvious hip fracture   PROCEDURES:  Critical Care performed: No  Procedures   MEDICATIONS ORDERED IN ED: Medications  lidocaine  (LIDODERM ) 5 % 1 patch (1 patch Transdermal Patch Applied 03/13/24 1235)  acetaminophen  (TYLENOL ) tablet 1,000 mg (1,000 mg Oral Given 03/13/24 1235)     IMPRESSION / MDM / ASSESSMENT AND PLAN / ED COURSE  I reviewed the triage vital signs and the nursing notes.   Patient's presentation is most consistent with acute presentation with potential  threat to life or bodily function.   Patient comes in with a fall.  On Eliquis  CT imaging ordered from triage to rule out intracranial hemorrhage, cervical fractures as well as x-rays to evaluate for hip fracture, constipation.  X-ray does show significant amount of stool.  X-ray of the hip was negative but given the osteoarthritis and patient's still having pain on clinical exam we will get CT imaging to further evaluate.  She really denies any abdominal pain but will do the CT of the abdomen and pelvis just to ensure no obstruction.  CBC is reassuring.  D/w POA Allen updated on the above and comfortable with the above plan peer  Lipase is reassuring.  CBC is reassuring.  CMP shows creatinine around baseline of 1.25  CT head and neck were nothing acute and she denied any neck pain.  Her CT abdomen pelvis does show some nondisplaced fractures.  There was some concern for some patchiness in her lungs but patient has no cough not short of breath and oxygen   levels are reassuring.  Discussed with Dr. Onesimo from ortho-pelvic fractures are nonoperative weightbearing as tolerated with walker  I did call [facility] and this is a SNF facility they stated that they would be able to move her to the rehab side and they are aware of these pelvic fractures.  Therefore no indication to admit here given she has already had SNF placement.    FINAL CLINICAL IMPRESSION(S) / ED DIAGNOSES   Final diagnoses:  Closed nondisplaced fracture of pelvis, unspecified part of pelvis, initial encounter (HCC)     Rx / DC Orders   ED Discharge Orders          Ordered    lidocaine  (LIDODERM ) 5 %  Every 24 hours        03/13/24 1407             Note:  This document was prepared using Dragon voice recognition software and may include unintentional dictation errors.   Ernest Ronal BRAVO, MD 03/13/24 [phone]

## 2024-03-13 NOTE — ED Notes (Signed)
 Patient transported to X-ray

## 2024-03-13 NOTE — ED Notes (Signed)
 MD Ernest updated/called report to [facility] [employer] and patient being discharged back to facility.

## 2024-03-15 DIAGNOSIS — N390 Urinary tract infection, site not specified: Secondary | ICD-10-CM | POA: Diagnosis not present

## 2024-03-15 DIAGNOSIS — I130 Hypertensive heart and chronic kidney disease with heart failure and stage 1 through stage 4 chronic kidney disease, or unspecified chronic kidney disease: Secondary | ICD-10-CM | POA: Diagnosis not present

## 2024-03-15 DIAGNOSIS — E876 Hypokalemia: Secondary | ICD-10-CM | POA: Diagnosis not present

## 2024-03-15 DIAGNOSIS — K219 Gastro-esophageal reflux disease without esophagitis: Secondary | ICD-10-CM | POA: Diagnosis not present

## 2024-03-15 DIAGNOSIS — N179 Acute kidney failure, unspecified: Secondary | ICD-10-CM | POA: Diagnosis not present

## 2024-03-15 DIAGNOSIS — R55 Syncope and collapse: Secondary | ICD-10-CM | POA: Diagnosis not present

## 2024-03-15 DIAGNOSIS — K5900 Constipation, unspecified: Secondary | ICD-10-CM | POA: Diagnosis not present

## 2024-03-15 DIAGNOSIS — I509 Heart failure, unspecified: Secondary | ICD-10-CM | POA: Diagnosis not present

## 2024-03-15 DIAGNOSIS — N183 Chronic kidney disease, stage 3 unspecified: Secondary | ICD-10-CM | POA: Diagnosis not present

## 2024-03-15 DIAGNOSIS — M79605 Pain in left leg: Secondary | ICD-10-CM | POA: Diagnosis not present

## 2024-03-15 DIAGNOSIS — S3289XD Fracture of other parts of pelvis, subsequent encounter for fracture with routine healing: Secondary | ICD-10-CM | POA: Diagnosis not present

## 2024-03-15 DIAGNOSIS — Z86711 Personal history of pulmonary embolism: Secondary | ICD-10-CM | POA: Diagnosis not present

## 2024-03-18 DIAGNOSIS — S3289XD Fracture of other parts of pelvis, subsequent encounter for fracture with routine healing: Secondary | ICD-10-CM | POA: Diagnosis not present

## 2024-03-18 DIAGNOSIS — Z86711 Personal history of pulmonary embolism: Secondary | ICD-10-CM | POA: Diagnosis not present

## 2024-03-18 DIAGNOSIS — I509 Heart failure, unspecified: Secondary | ICD-10-CM | POA: Diagnosis not present

## 2024-03-18 DIAGNOSIS — R55 Syncope and collapse: Secondary | ICD-10-CM | POA: Diagnosis not present

## 2024-03-18 DIAGNOSIS — I130 Hypertensive heart and chronic kidney disease with heart failure and stage 1 through stage 4 chronic kidney disease, or unspecified chronic kidney disease: Secondary | ICD-10-CM | POA: Diagnosis not present

## 2024-03-18 DIAGNOSIS — Z9181 History of falling: Secondary | ICD-10-CM | POA: Diagnosis not present

## 2024-03-18 DIAGNOSIS — N183 Chronic kidney disease, stage 3 unspecified: Secondary | ICD-10-CM | POA: Diagnosis not present

## 2024-03-18 DIAGNOSIS — M79605 Pain in left leg: Secondary | ICD-10-CM | POA: Diagnosis not present

## 2024-03-18 DIAGNOSIS — N390 Urinary tract infection, site not specified: Secondary | ICD-10-CM | POA: Diagnosis not present

## 2024-03-18 DIAGNOSIS — K5900 Constipation, unspecified: Secondary | ICD-10-CM | POA: Diagnosis not present

## 2024-03-19 DIAGNOSIS — W19XXXA Unspecified fall, initial encounter: Secondary | ICD-10-CM | POA: Diagnosis not present

## 2024-03-19 DIAGNOSIS — N3281 Overactive bladder: Secondary | ICD-10-CM | POA: Diagnosis not present

## 2024-03-19 DIAGNOSIS — R54 Age-related physical debility: Secondary | ICD-10-CM | POA: Diagnosis not present

## 2024-03-19 DIAGNOSIS — I2699 Other pulmonary embolism without acute cor pulmonale: Secondary | ICD-10-CM | POA: Diagnosis not present

## 2024-03-19 DIAGNOSIS — M545 Low back pain, unspecified: Secondary | ICD-10-CM | POA: Diagnosis not present

## 2024-03-19 DIAGNOSIS — N179 Acute kidney failure, unspecified: Secondary | ICD-10-CM | POA: Diagnosis not present

## 2024-03-20 DIAGNOSIS — Z7901 Long term (current) use of anticoagulants: Secondary | ICD-10-CM | POA: Diagnosis not present

## 2024-03-20 DIAGNOSIS — S3289XD Fracture of other parts of pelvis, subsequent encounter for fracture with routine healing: Secondary | ICD-10-CM | POA: Diagnosis not present

## 2024-03-20 DIAGNOSIS — I509 Heart failure, unspecified: Secondary | ICD-10-CM | POA: Diagnosis not present

## 2024-03-20 DIAGNOSIS — Z9181 History of falling: Secondary | ICD-10-CM | POA: Diagnosis not present

## 2024-03-20 DIAGNOSIS — N183 Chronic kidney disease, stage 3 unspecified: Secondary | ICD-10-CM | POA: Diagnosis not present

## 2024-03-20 DIAGNOSIS — I130 Hypertensive heart and chronic kidney disease with heart failure and stage 1 through stage 4 chronic kidney disease, or unspecified chronic kidney disease: Secondary | ICD-10-CM | POA: Diagnosis not present

## 2024-03-20 DIAGNOSIS — J449 Chronic obstructive pulmonary disease, unspecified: Secondary | ICD-10-CM | POA: Diagnosis not present

## 2024-03-20 DIAGNOSIS — Z86711 Personal history of pulmonary embolism: Secondary | ICD-10-CM | POA: Diagnosis not present

## 2024-03-20 DIAGNOSIS — K5900 Constipation, unspecified: Secondary | ICD-10-CM | POA: Diagnosis not present

## 2024-03-20 DIAGNOSIS — N390 Urinary tract infection, site not specified: Secondary | ICD-10-CM | POA: Diagnosis not present

## 2024-03-20 DIAGNOSIS — R55 Syncope and collapse: Secondary | ICD-10-CM | POA: Diagnosis not present

## 2024-03-22 DIAGNOSIS — J449 Chronic obstructive pulmonary disease, unspecified: Secondary | ICD-10-CM | POA: Diagnosis not present

## 2024-03-22 DIAGNOSIS — Z7901 Long term (current) use of anticoagulants: Secondary | ICD-10-CM | POA: Diagnosis not present

## 2024-03-22 DIAGNOSIS — K219 Gastro-esophageal reflux disease without esophagitis: Secondary | ICD-10-CM | POA: Diagnosis not present

## 2024-03-22 DIAGNOSIS — M79661 Pain in right lower leg: Secondary | ICD-10-CM | POA: Diagnosis not present

## 2024-03-22 DIAGNOSIS — Z86711 Personal history of pulmonary embolism: Secondary | ICD-10-CM | POA: Diagnosis not present

## 2024-03-22 DIAGNOSIS — E785 Hyperlipidemia, unspecified: Secondary | ICD-10-CM | POA: Diagnosis not present

## 2024-03-22 DIAGNOSIS — G4700 Insomnia, unspecified: Secondary | ICD-10-CM | POA: Diagnosis not present

## 2024-03-22 DIAGNOSIS — Z9181 History of falling: Secondary | ICD-10-CM | POA: Diagnosis not present

## 2024-03-22 DIAGNOSIS — I130 Hypertensive heart and chronic kidney disease with heart failure and stage 1 through stage 4 chronic kidney disease, or unspecified chronic kidney disease: Secondary | ICD-10-CM | POA: Diagnosis not present

## 2024-03-22 DIAGNOSIS — M79662 Pain in left lower leg: Secondary | ICD-10-CM | POA: Diagnosis not present

## 2024-03-25 DIAGNOSIS — I130 Hypertensive heart and chronic kidney disease with heart failure and stage 1 through stage 4 chronic kidney disease, or unspecified chronic kidney disease: Secondary | ICD-10-CM | POA: Diagnosis not present

## 2024-03-25 DIAGNOSIS — K219 Gastro-esophageal reflux disease without esophagitis: Secondary | ICD-10-CM | POA: Diagnosis not present

## 2024-03-25 DIAGNOSIS — I509 Heart failure, unspecified: Secondary | ICD-10-CM | POA: Diagnosis not present

## 2024-03-25 DIAGNOSIS — S3289XD Fracture of other parts of pelvis, subsequent encounter for fracture with routine healing: Secondary | ICD-10-CM | POA: Diagnosis not present

## 2024-03-25 DIAGNOSIS — J449 Chronic obstructive pulmonary disease, unspecified: Secondary | ICD-10-CM | POA: Diagnosis not present

## 2024-03-25 DIAGNOSIS — Z7901 Long term (current) use of anticoagulants: Secondary | ICD-10-CM | POA: Diagnosis not present

## 2024-03-25 DIAGNOSIS — G4700 Insomnia, unspecified: Secondary | ICD-10-CM | POA: Diagnosis not present

## 2024-03-25 DIAGNOSIS — N183 Chronic kidney disease, stage 3 unspecified: Secondary | ICD-10-CM | POA: Diagnosis not present

## 2024-03-25 DIAGNOSIS — Z86711 Personal history of pulmonary embolism: Secondary | ICD-10-CM | POA: Diagnosis not present

## 2024-03-25 DIAGNOSIS — Z9181 History of falling: Secondary | ICD-10-CM | POA: Diagnosis not present

## 2024-03-27 DIAGNOSIS — Z7901 Long term (current) use of anticoagulants: Secondary | ICD-10-CM | POA: Diagnosis not present

## 2024-03-27 DIAGNOSIS — J449 Chronic obstructive pulmonary disease, unspecified: Secondary | ICD-10-CM | POA: Diagnosis not present

## 2024-03-27 DIAGNOSIS — N1831 Chronic kidney disease, stage 3a: Secondary | ICD-10-CM | POA: Diagnosis not present

## 2024-03-27 DIAGNOSIS — Z86711 Personal history of pulmonary embolism: Secondary | ICD-10-CM | POA: Diagnosis not present

## 2024-03-27 DIAGNOSIS — M549 Dorsalgia, unspecified: Secondary | ICD-10-CM | POA: Diagnosis not present

## 2024-03-27 DIAGNOSIS — K5900 Constipation, unspecified: Secondary | ICD-10-CM | POA: Diagnosis not present

## 2024-03-27 DIAGNOSIS — S3289XD Fracture of other parts of pelvis, subsequent encounter for fracture with routine healing: Secondary | ICD-10-CM | POA: Diagnosis not present

## 2024-03-27 DIAGNOSIS — I130 Hypertensive heart and chronic kidney disease with heart failure and stage 1 through stage 4 chronic kidney disease, or unspecified chronic kidney disease: Secondary | ICD-10-CM | POA: Diagnosis not present

## 2024-03-27 DIAGNOSIS — R55 Syncope and collapse: Secondary | ICD-10-CM | POA: Diagnosis not present

## 2024-03-27 DIAGNOSIS — G4700 Insomnia, unspecified: Secondary | ICD-10-CM | POA: Diagnosis not present

## 2024-03-27 DIAGNOSIS — I509 Heart failure, unspecified: Secondary | ICD-10-CM | POA: Diagnosis not present

## 2024-03-27 DIAGNOSIS — K219 Gastro-esophageal reflux disease without esophagitis: Secondary | ICD-10-CM | POA: Diagnosis not present

## 2024-03-27 DIAGNOSIS — N183 Chronic kidney disease, stage 3 unspecified: Secondary | ICD-10-CM | POA: Diagnosis not present

## 2024-03-27 DIAGNOSIS — Z9181 History of falling: Secondary | ICD-10-CM | POA: Diagnosis not present

## 2024-04-02 DIAGNOSIS — R2689 Other abnormalities of gait and mobility: Secondary | ICD-10-CM | POA: Diagnosis not present

## 2024-04-02 DIAGNOSIS — R296 Repeated falls: Secondary | ICD-10-CM | POA: Diagnosis not present

## 2024-04-02 DIAGNOSIS — R278 Other lack of coordination: Secondary | ICD-10-CM | POA: Diagnosis not present

## 2024-04-02 DIAGNOSIS — R2681 Unsteadiness on feet: Secondary | ICD-10-CM | POA: Diagnosis not present

## 2024-04-02 DIAGNOSIS — M6259 Muscle wasting and atrophy, not elsewhere classified, multiple sites: Secondary | ICD-10-CM | POA: Diagnosis not present

## 2024-04-03 DIAGNOSIS — R2681 Unsteadiness on feet: Secondary | ICD-10-CM | POA: Diagnosis not present

## 2024-04-03 DIAGNOSIS — R296 Repeated falls: Secondary | ICD-10-CM | POA: Diagnosis not present

## 2024-04-04 DIAGNOSIS — Z7901 Long term (current) use of anticoagulants: Secondary | ICD-10-CM | POA: Diagnosis not present

## 2024-04-04 DIAGNOSIS — R2689 Other abnormalities of gait and mobility: Secondary | ICD-10-CM | POA: Diagnosis not present

## 2024-04-04 DIAGNOSIS — Z86711 Personal history of pulmonary embolism: Secondary | ICD-10-CM | POA: Diagnosis not present

## 2024-04-04 DIAGNOSIS — G4700 Insomnia, unspecified: Secondary | ICD-10-CM | POA: Diagnosis not present

## 2024-04-04 DIAGNOSIS — M6259 Muscle wasting and atrophy, not elsewhere classified, multiple sites: Secondary | ICD-10-CM | POA: Diagnosis not present

## 2024-04-04 DIAGNOSIS — R278 Other lack of coordination: Secondary | ICD-10-CM | POA: Diagnosis not present

## 2024-04-04 DIAGNOSIS — I130 Hypertensive heart and chronic kidney disease with heart failure and stage 1 through stage 4 chronic kidney disease, or unspecified chronic kidney disease: Secondary | ICD-10-CM | POA: Diagnosis not present

## 2024-04-04 DIAGNOSIS — I509 Heart failure, unspecified: Secondary | ICD-10-CM | POA: Diagnosis not present

## 2024-04-04 DIAGNOSIS — E785 Hyperlipidemia, unspecified: Secondary | ICD-10-CM | POA: Diagnosis not present

## 2024-04-04 DIAGNOSIS — N3281 Overactive bladder: Secondary | ICD-10-CM | POA: Diagnosis not present

## 2024-04-04 DIAGNOSIS — J449 Chronic obstructive pulmonary disease, unspecified: Secondary | ICD-10-CM | POA: Diagnosis not present

## 2024-04-04 DIAGNOSIS — R2681 Unsteadiness on feet: Secondary | ICD-10-CM | POA: Diagnosis not present

## 2024-04-04 DIAGNOSIS — R296 Repeated falls: Secondary | ICD-10-CM | POA: Diagnosis not present

## 2024-04-04 DIAGNOSIS — N1831 Chronic kidney disease, stage 3a: Secondary | ICD-10-CM | POA: Diagnosis not present

## 2024-04-05 DIAGNOSIS — R2681 Unsteadiness on feet: Secondary | ICD-10-CM | POA: Diagnosis not present

## 2024-04-05 DIAGNOSIS — R296 Repeated falls: Secondary | ICD-10-CM | POA: Diagnosis not present

## 2024-04-08 DIAGNOSIS — R278 Other lack of coordination: Secondary | ICD-10-CM | POA: Diagnosis not present

## 2024-04-08 DIAGNOSIS — R2689 Other abnormalities of gait and mobility: Secondary | ICD-10-CM | POA: Diagnosis not present

## 2024-04-08 DIAGNOSIS — M6259 Muscle wasting and atrophy, not elsewhere classified, multiple sites: Secondary | ICD-10-CM | POA: Diagnosis not present

## 2024-04-08 DIAGNOSIS — R2681 Unsteadiness on feet: Secondary | ICD-10-CM | POA: Diagnosis not present

## 2024-04-08 DIAGNOSIS — R296 Repeated falls: Secondary | ICD-10-CM | POA: Diagnosis not present

## 2024-04-09 DIAGNOSIS — R296 Repeated falls: Secondary | ICD-10-CM | POA: Diagnosis not present

## 2024-04-10 DIAGNOSIS — R278 Other lack of coordination: Secondary | ICD-10-CM | POA: Diagnosis not present

## 2024-04-10 DIAGNOSIS — R296 Repeated falls: Secondary | ICD-10-CM | POA: Diagnosis not present

## 2024-04-10 DIAGNOSIS — R2681 Unsteadiness on feet: Secondary | ICD-10-CM | POA: Diagnosis not present

## 2024-04-10 DIAGNOSIS — M6259 Muscle wasting and atrophy, not elsewhere classified, multiple sites: Secondary | ICD-10-CM | POA: Diagnosis not present

## 2024-04-10 DIAGNOSIS — R2689 Other abnormalities of gait and mobility: Secondary | ICD-10-CM | POA: Diagnosis not present

## 2024-04-11 DIAGNOSIS — R2681 Unsteadiness on feet: Secondary | ICD-10-CM | POA: Diagnosis not present

## 2024-04-11 DIAGNOSIS — R296 Repeated falls: Secondary | ICD-10-CM | POA: Diagnosis not present

## 2024-04-12 DIAGNOSIS — I509 Heart failure, unspecified: Secondary | ICD-10-CM | POA: Diagnosis not present

## 2024-04-16 DIAGNOSIS — E785 Hyperlipidemia, unspecified: Secondary | ICD-10-CM | POA: Diagnosis not present

## 2024-04-16 DIAGNOSIS — I10 Essential (primary) hypertension: Secondary | ICD-10-CM | POA: Diagnosis not present

## 2024-04-16 DIAGNOSIS — Z131 Encounter for screening for diabetes mellitus: Secondary | ICD-10-CM | POA: Diagnosis not present

## 2024-04-16 DIAGNOSIS — R2681 Unsteadiness on feet: Secondary | ICD-10-CM | POA: Diagnosis not present

## 2024-04-16 DIAGNOSIS — R296 Repeated falls: Secondary | ICD-10-CM | POA: Diagnosis not present

## 2024-04-16 DIAGNOSIS — Z1329 Encounter for screening for other suspected endocrine disorder: Secondary | ICD-10-CM | POA: Diagnosis not present

## 2024-04-17 DIAGNOSIS — R296 Repeated falls: Secondary | ICD-10-CM | POA: Diagnosis not present

## 2024-04-17 DIAGNOSIS — M6259 Muscle wasting and atrophy, not elsewhere classified, multiple sites: Secondary | ICD-10-CM | POA: Diagnosis not present

## 2024-04-17 DIAGNOSIS — R2681 Unsteadiness on feet: Secondary | ICD-10-CM | POA: Diagnosis not present

## 2024-04-17 DIAGNOSIS — R2689 Other abnormalities of gait and mobility: Secondary | ICD-10-CM | POA: Diagnosis not present

## 2024-04-17 DIAGNOSIS — R278 Other lack of coordination: Secondary | ICD-10-CM | POA: Diagnosis not present

## 2024-04-18 DIAGNOSIS — R2681 Unsteadiness on feet: Secondary | ICD-10-CM | POA: Diagnosis not present

## 2024-04-18 DIAGNOSIS — R296 Repeated falls: Secondary | ICD-10-CM | POA: Diagnosis not present

## 2024-04-19 DIAGNOSIS — R296 Repeated falls: Secondary | ICD-10-CM | POA: Diagnosis not present

## 2024-04-19 DIAGNOSIS — R278 Other lack of coordination: Secondary | ICD-10-CM | POA: Diagnosis not present

## 2024-04-19 DIAGNOSIS — R2681 Unsteadiness on feet: Secondary | ICD-10-CM | POA: Diagnosis not present

## 2024-04-19 DIAGNOSIS — M6259 Muscle wasting and atrophy, not elsewhere classified, multiple sites: Secondary | ICD-10-CM | POA: Diagnosis not present

## 2024-04-19 DIAGNOSIS — R2689 Other abnormalities of gait and mobility: Secondary | ICD-10-CM | POA: Diagnosis not present

## 2024-04-22 DIAGNOSIS — R296 Repeated falls: Secondary | ICD-10-CM | POA: Diagnosis not present

## 2024-04-22 DIAGNOSIS — R2681 Unsteadiness on feet: Secondary | ICD-10-CM | POA: Diagnosis not present

## 2024-04-23 DIAGNOSIS — R296 Repeated falls: Secondary | ICD-10-CM | POA: Diagnosis not present

## 2024-04-23 DIAGNOSIS — M6259 Muscle wasting and atrophy, not elsewhere classified, multiple sites: Secondary | ICD-10-CM | POA: Diagnosis not present

## 2024-04-23 DIAGNOSIS — R2689 Other abnormalities of gait and mobility: Secondary | ICD-10-CM | POA: Diagnosis not present

## 2024-04-23 DIAGNOSIS — R278 Other lack of coordination: Secondary | ICD-10-CM | POA: Diagnosis not present

## 2024-04-23 DIAGNOSIS — R2681 Unsteadiness on feet: Secondary | ICD-10-CM | POA: Diagnosis not present

## 2024-04-25 DIAGNOSIS — R278 Other lack of coordination: Secondary | ICD-10-CM | POA: Diagnosis not present

## 2024-04-25 DIAGNOSIS — R296 Repeated falls: Secondary | ICD-10-CM | POA: Diagnosis not present

## 2024-04-25 DIAGNOSIS — R2681 Unsteadiness on feet: Secondary | ICD-10-CM | POA: Diagnosis not present

## 2024-04-25 DIAGNOSIS — M6259 Muscle wasting and atrophy, not elsewhere classified, multiple sites: Secondary | ICD-10-CM | POA: Diagnosis not present

## 2024-04-25 DIAGNOSIS — R2689 Other abnormalities of gait and mobility: Secondary | ICD-10-CM | POA: Diagnosis not present

## 2024-04-26 DIAGNOSIS — R296 Repeated falls: Secondary | ICD-10-CM | POA: Diagnosis not present

## 2024-04-26 DIAGNOSIS — R2681 Unsteadiness on feet: Secondary | ICD-10-CM | POA: Diagnosis not present

## 2024-04-27 DIAGNOSIS — N1831 Chronic kidney disease, stage 3a: Secondary | ICD-10-CM | POA: Diagnosis not present

## 2024-04-27 DIAGNOSIS — M549 Dorsalgia, unspecified: Secondary | ICD-10-CM | POA: Diagnosis not present

## 2024-04-30 DIAGNOSIS — R278 Other lack of coordination: Secondary | ICD-10-CM | POA: Diagnosis not present

## 2024-04-30 DIAGNOSIS — R296 Repeated falls: Secondary | ICD-10-CM | POA: Diagnosis not present

## 2024-04-30 DIAGNOSIS — R2689 Other abnormalities of gait and mobility: Secondary | ICD-10-CM | POA: Diagnosis not present

## 2024-04-30 DIAGNOSIS — Z86711 Personal history of pulmonary embolism: Secondary | ICD-10-CM | POA: Diagnosis not present

## 2024-04-30 DIAGNOSIS — M6259 Muscle wasting and atrophy, not elsewhere classified, multiple sites: Secondary | ICD-10-CM | POA: Diagnosis not present

## 2024-04-30 DIAGNOSIS — R2681 Unsteadiness on feet: Secondary | ICD-10-CM | POA: Diagnosis not present

## 2024-04-30 DIAGNOSIS — E785 Hyperlipidemia, unspecified: Secondary | ICD-10-CM | POA: Diagnosis not present

## 2024-04-30 DIAGNOSIS — Z7901 Long term (current) use of anticoagulants: Secondary | ICD-10-CM | POA: Diagnosis not present

## 2024-04-30 DIAGNOSIS — J449 Chronic obstructive pulmonary disease, unspecified: Secondary | ICD-10-CM | POA: Diagnosis not present

## 2024-05-01 DIAGNOSIS — R2681 Unsteadiness on feet: Secondary | ICD-10-CM | POA: Diagnosis not present

## 2024-05-01 DIAGNOSIS — R296 Repeated falls: Secondary | ICD-10-CM | POA: Diagnosis not present

## 2024-05-02 DIAGNOSIS — R278 Other lack of coordination: Secondary | ICD-10-CM | POA: Diagnosis not present

## 2024-05-02 DIAGNOSIS — R2689 Other abnormalities of gait and mobility: Secondary | ICD-10-CM | POA: Diagnosis not present

## 2024-05-02 DIAGNOSIS — R2681 Unsteadiness on feet: Secondary | ICD-10-CM | POA: Diagnosis not present

## 2024-05-02 DIAGNOSIS — M6259 Muscle wasting and atrophy, not elsewhere classified, multiple sites: Secondary | ICD-10-CM | POA: Diagnosis not present

## 2024-05-02 DIAGNOSIS — R296 Repeated falls: Secondary | ICD-10-CM | POA: Diagnosis not present

## 2024-05-03 DIAGNOSIS — R2681 Unsteadiness on feet: Secondary | ICD-10-CM | POA: Diagnosis not present

## 2024-05-03 DIAGNOSIS — R296 Repeated falls: Secondary | ICD-10-CM | POA: Diagnosis not present

## 2024-05-06 DIAGNOSIS — R296 Repeated falls: Secondary | ICD-10-CM | POA: Diagnosis not present

## 2024-05-06 DIAGNOSIS — R2681 Unsteadiness on feet: Secondary | ICD-10-CM | POA: Diagnosis not present

## 2024-05-06 DIAGNOSIS — R2689 Other abnormalities of gait and mobility: Secondary | ICD-10-CM | POA: Diagnosis not present

## 2024-05-06 DIAGNOSIS — M6259 Muscle wasting and atrophy, not elsewhere classified, multiple sites: Secondary | ICD-10-CM | POA: Diagnosis not present

## 2024-05-06 DIAGNOSIS — R278 Other lack of coordination: Secondary | ICD-10-CM | POA: Diagnosis not present

## 2024-05-07 DIAGNOSIS — R2681 Unsteadiness on feet: Secondary | ICD-10-CM | POA: Diagnosis not present

## 2024-05-07 DIAGNOSIS — R296 Repeated falls: Secondary | ICD-10-CM | POA: Diagnosis not present

## 2024-05-09 DIAGNOSIS — M6259 Muscle wasting and atrophy, not elsewhere classified, multiple sites: Secondary | ICD-10-CM | POA: Diagnosis not present

## 2024-05-09 DIAGNOSIS — R296 Repeated falls: Secondary | ICD-10-CM | POA: Diagnosis not present

## 2024-05-09 DIAGNOSIS — R278 Other lack of coordination: Secondary | ICD-10-CM | POA: Diagnosis not present

## 2024-05-09 DIAGNOSIS — R2689 Other abnormalities of gait and mobility: Secondary | ICD-10-CM | POA: Diagnosis not present

## 2024-05-09 DIAGNOSIS — R2681 Unsteadiness on feet: Secondary | ICD-10-CM | POA: Diagnosis not present

## 2024-05-10 DIAGNOSIS — R296 Repeated falls: Secondary | ICD-10-CM | POA: Diagnosis not present

## 2024-05-10 DIAGNOSIS — R2681 Unsteadiness on feet: Secondary | ICD-10-CM | POA: Diagnosis not present

## 2024-05-13 DIAGNOSIS — R296 Repeated falls: Secondary | ICD-10-CM | POA: Diagnosis not present

## 2024-05-13 DIAGNOSIS — R2681 Unsteadiness on feet: Secondary | ICD-10-CM | POA: Diagnosis not present

## 2024-05-14 DIAGNOSIS — M6259 Muscle wasting and atrophy, not elsewhere classified, multiple sites: Secondary | ICD-10-CM | POA: Diagnosis not present

## 2024-05-14 DIAGNOSIS — R296 Repeated falls: Secondary | ICD-10-CM | POA: Diagnosis not present

## 2024-05-14 DIAGNOSIS — R2681 Unsteadiness on feet: Secondary | ICD-10-CM | POA: Diagnosis not present

## 2024-05-14 DIAGNOSIS — R278 Other lack of coordination: Secondary | ICD-10-CM | POA: Diagnosis not present

## 2024-05-14 DIAGNOSIS — R2689 Other abnormalities of gait and mobility: Secondary | ICD-10-CM | POA: Diagnosis not present

## 2024-05-27 ENCOUNTER — Other Ambulatory Visit

## 2024-05-27 ENCOUNTER — Ambulatory Visit

## 2024-05-27 ENCOUNTER — Ambulatory Visit: Admitting: Internal Medicine

## 2024-05-27 DIAGNOSIS — M549 Dorsalgia, unspecified: Secondary | ICD-10-CM | POA: Diagnosis not present

## 2024-05-27 DIAGNOSIS — N1831 Chronic kidney disease, stage 3a: Secondary | ICD-10-CM | POA: Diagnosis not present

## 2024-05-28 DIAGNOSIS — J449 Chronic obstructive pulmonary disease, unspecified: Secondary | ICD-10-CM | POA: Diagnosis not present

## 2024-05-28 DIAGNOSIS — Z86711 Personal history of pulmonary embolism: Secondary | ICD-10-CM | POA: Diagnosis not present

## 2024-05-28 DIAGNOSIS — E785 Hyperlipidemia, unspecified: Secondary | ICD-10-CM | POA: Diagnosis not present

## 2024-05-28 DIAGNOSIS — Z7901 Long term (current) use of anticoagulants: Secondary | ICD-10-CM | POA: Diagnosis not present

## 2024-07-03 ENCOUNTER — Inpatient Hospital Stay

## 2024-07-03 ENCOUNTER — Inpatient Hospital Stay: Admitting: Internal Medicine
